# Patient Record
Sex: Female | Born: 1965 | Race: White | Hispanic: No | Marital: Married | Smoking: Former smoker
Health system: Southern US, Community
[De-identification: ages and names within clinical notes are randomized; demographics above are authoritative.]

## PROBLEM LIST (undated history)

## (undated) DIAGNOSIS — K760 Fatty (change of) liver, not elsewhere classified: Secondary | ICD-10-CM

## (undated) DIAGNOSIS — F419 Anxiety disorder, unspecified: Secondary | ICD-10-CM

## (undated) DIAGNOSIS — R569 Unspecified convulsions: Secondary | ICD-10-CM

## (undated) DIAGNOSIS — F32A Depression, unspecified: Secondary | ICD-10-CM

## (undated) DIAGNOSIS — F4310 Post-traumatic stress disorder, unspecified: Secondary | ICD-10-CM

## (undated) DIAGNOSIS — R079 Chest pain, unspecified: Secondary | ICD-10-CM

## (undated) DIAGNOSIS — R0602 Shortness of breath: Secondary | ICD-10-CM

## (undated) DIAGNOSIS — M069 Rheumatoid arthritis, unspecified: Secondary | ICD-10-CM

## (undated) DIAGNOSIS — F329 Major depressive disorder, single episode, unspecified: Secondary | ICD-10-CM

## (undated) DIAGNOSIS — M2550 Pain in unspecified joint: Secondary | ICD-10-CM

## (undated) DIAGNOSIS — R0600 Dyspnea, unspecified: Secondary | ICD-10-CM

## (undated) DIAGNOSIS — G43909 Migraine, unspecified, not intractable, without status migrainosus: Secondary | ICD-10-CM

## (undated) DIAGNOSIS — G4730 Sleep apnea, unspecified: Secondary | ICD-10-CM

## (undated) DIAGNOSIS — E7800 Pure hypercholesterolemia, unspecified: Secondary | ICD-10-CM

## (undated) DIAGNOSIS — K219 Gastro-esophageal reflux disease without esophagitis: Secondary | ICD-10-CM

## (undated) DIAGNOSIS — F319 Bipolar disorder, unspecified: Secondary | ICD-10-CM

## (undated) DIAGNOSIS — I10 Essential (primary) hypertension: Secondary | ICD-10-CM

## (undated) DIAGNOSIS — Z0189 Encounter for other specified special examinations: Secondary | ICD-10-CM

## (undated) DIAGNOSIS — R519 Headache, unspecified: Secondary | ICD-10-CM

## (undated) DIAGNOSIS — R51 Headache: Secondary | ICD-10-CM

## (undated) DIAGNOSIS — M549 Dorsalgia, unspecified: Secondary | ICD-10-CM

## (undated) DIAGNOSIS — G4733 Obstructive sleep apnea (adult) (pediatric): Secondary | ICD-10-CM

## (undated) DIAGNOSIS — M7989 Other specified soft tissue disorders: Secondary | ICD-10-CM

## (undated) DIAGNOSIS — E119 Type 2 diabetes mellitus without complications: Secondary | ICD-10-CM

## (undated) DIAGNOSIS — K5900 Constipation, unspecified: Secondary | ICD-10-CM

## (undated) DIAGNOSIS — F445 Conversion disorder with seizures or convulsions: Secondary | ICD-10-CM

## (undated) DIAGNOSIS — L719 Rosacea, unspecified: Secondary | ICD-10-CM

## (undated) HISTORY — PX: TUBAL LIGATION: SHX77

## (undated) HISTORY — DX: Encounter for other specified special examinations: Z01.89

## (undated) HISTORY — DX: Chest pain, unspecified: R07.9

## (undated) HISTORY — PX: APPENDECTOMY: SHX54

## (undated) HISTORY — PX: ABDOMINAL HYSTERECTOMY: SHX81

## (undated) HISTORY — DX: Fatty (change of) liver, not elsewhere classified: K76.0

## (undated) HISTORY — DX: Bipolar disorder, unspecified: F31.9

## (undated) HISTORY — DX: Essential (primary) hypertension: I10

## (undated) HISTORY — PX: TONSILLECTOMY: SUR1361

## (undated) HISTORY — PX: CYST EXCISION: SHX5701

## (undated) HISTORY — DX: Type 2 diabetes mellitus without complications: E11.9

## (undated) HISTORY — DX: Shortness of breath: R06.02

## (undated) HISTORY — PX: OTHER SURGICAL HISTORY: SHX169

## (undated) HISTORY — PX: ROTATOR CUFF REPAIR: SHX139

## (undated) HISTORY — DX: Pain in unspecified joint: M25.50

## (undated) HISTORY — DX: Migraine, unspecified, not intractable, without status migrainosus: G43.909

## (undated) HISTORY — DX: Rosacea, unspecified: L71.9

## (undated) HISTORY — DX: Anxiety disorder, unspecified: F41.9

## (undated) HISTORY — DX: Dorsalgia, unspecified: M54.9

## (undated) HISTORY — PX: BACK SURGERY: SHX140

## (undated) HISTORY — DX: Gastro-esophageal reflux disease without esophagitis: K21.9

## (undated) HISTORY — DX: Pure hypercholesterolemia, unspecified: E78.00

## (undated) HISTORY — DX: Rheumatoid arthritis, unspecified: M06.9

## (undated) HISTORY — DX: Constipation, unspecified: K59.00

## (undated) HISTORY — DX: Obstructive sleep apnea (adult) (pediatric): G47.33

## (undated) HISTORY — PX: CHOLECYSTECTOMY: SHX55

## (undated) HISTORY — DX: Other specified soft tissue disorders: M79.89

---

## 2002-01-03 ENCOUNTER — Encounter: Payer: Self-pay | Admitting: *Deleted

## 2002-01-03 ENCOUNTER — Emergency Department (HOSPITAL_COMMUNITY): Admission: EM | Admit: 2002-01-03 | Discharge: 2002-01-03 | Payer: Self-pay | Admitting: *Deleted

## 2002-03-28 ENCOUNTER — Ambulatory Visit (HOSPITAL_COMMUNITY): Admission: RE | Admit: 2002-03-28 | Discharge: 2002-03-28 | Payer: Self-pay | Admitting: Family Medicine

## 2002-03-28 ENCOUNTER — Encounter: Payer: Self-pay | Admitting: Family Medicine

## 2002-06-15 ENCOUNTER — Emergency Department (HOSPITAL_COMMUNITY): Admission: EM | Admit: 2002-06-15 | Discharge: 2002-06-15 | Payer: Self-pay | Admitting: Emergency Medicine

## 2002-10-18 ENCOUNTER — Encounter: Payer: Self-pay | Admitting: Emergency Medicine

## 2002-10-18 ENCOUNTER — Emergency Department (HOSPITAL_COMMUNITY): Admission: EM | Admit: 2002-10-18 | Discharge: 2002-10-19 | Payer: Self-pay | Admitting: Emergency Medicine

## 2002-12-12 ENCOUNTER — Inpatient Hospital Stay (HOSPITAL_COMMUNITY): Admission: EM | Admit: 2002-12-12 | Discharge: 2002-12-17 | Payer: Self-pay | Admitting: Internal Medicine

## 2002-12-12 ENCOUNTER — Encounter: Payer: Self-pay | Admitting: Family Medicine

## 2002-12-12 ENCOUNTER — Encounter: Payer: Self-pay | Admitting: *Deleted

## 2002-12-12 ENCOUNTER — Encounter: Payer: Self-pay | Admitting: Internal Medicine

## 2002-12-15 ENCOUNTER — Encounter: Payer: Self-pay | Admitting: Family Medicine

## 2002-12-16 ENCOUNTER — Encounter: Payer: Self-pay | Admitting: Internal Medicine

## 2003-01-27 ENCOUNTER — Emergency Department (HOSPITAL_COMMUNITY): Admission: EM | Admit: 2003-01-27 | Discharge: 2003-01-27 | Payer: Self-pay | Admitting: Emergency Medicine

## 2003-04-28 ENCOUNTER — Ambulatory Visit (HOSPITAL_COMMUNITY): Admission: RE | Admit: 2003-04-28 | Discharge: 2003-04-28 | Payer: Self-pay | Admitting: Family Medicine

## 2003-05-02 ENCOUNTER — Inpatient Hospital Stay (HOSPITAL_COMMUNITY): Admission: AD | Admit: 2003-05-02 | Discharge: 2003-05-05 | Payer: Self-pay | Admitting: Neurological Surgery

## 2003-12-07 ENCOUNTER — Emergency Department (HOSPITAL_COMMUNITY): Admission: EM | Admit: 2003-12-07 | Discharge: 2003-12-07 | Payer: Self-pay | Admitting: Emergency Medicine

## 2004-01-07 ENCOUNTER — Ambulatory Visit (HOSPITAL_COMMUNITY): Admission: RE | Admit: 2004-01-07 | Discharge: 2004-01-07 | Payer: Self-pay | Admitting: Neurological Surgery

## 2004-02-20 ENCOUNTER — Emergency Department (HOSPITAL_COMMUNITY): Admission: EM | Admit: 2004-02-20 | Discharge: 2004-02-20 | Payer: Self-pay | Admitting: *Deleted

## 2004-02-24 ENCOUNTER — Ambulatory Visit (HOSPITAL_COMMUNITY): Admission: RE | Admit: 2004-02-24 | Discharge: 2004-02-24 | Payer: Self-pay | Admitting: Neurological Surgery

## 2004-03-01 ENCOUNTER — Emergency Department (HOSPITAL_COMMUNITY): Admission: EM | Admit: 2004-03-01 | Discharge: 2004-03-01 | Payer: Self-pay | Admitting: Emergency Medicine

## 2004-03-10 ENCOUNTER — Emergency Department (HOSPITAL_COMMUNITY): Admission: EM | Admit: 2004-03-10 | Discharge: 2004-03-10 | Payer: Self-pay | Admitting: Emergency Medicine

## 2004-03-17 ENCOUNTER — Encounter
Admission: RE | Admit: 2004-03-17 | Discharge: 2004-06-15 | Payer: Self-pay | Admitting: Physical Medicine & Rehabilitation

## 2004-03-21 ENCOUNTER — Ambulatory Visit: Payer: Self-pay | Admitting: Physical Medicine & Rehabilitation

## 2004-04-01 ENCOUNTER — Ambulatory Visit: Payer: Self-pay | Admitting: Physical Medicine & Rehabilitation

## 2004-05-10 ENCOUNTER — Emergency Department (HOSPITAL_COMMUNITY): Admission: EM | Admit: 2004-05-10 | Discharge: 2004-05-11 | Payer: Self-pay | Admitting: Emergency Medicine

## 2004-05-19 ENCOUNTER — Encounter: Admission: RE | Admit: 2004-05-19 | Discharge: 2004-05-19 | Payer: Self-pay | Admitting: Neurological Surgery

## 2004-06-28 ENCOUNTER — Encounter (HOSPITAL_COMMUNITY): Admission: RE | Admit: 2004-06-28 | Discharge: 2004-07-28 | Payer: Self-pay | Admitting: Neurological Surgery

## 2004-07-19 ENCOUNTER — Emergency Department (HOSPITAL_COMMUNITY): Admission: EM | Admit: 2004-07-19 | Discharge: 2004-07-19 | Payer: Self-pay | Admitting: Emergency Medicine

## 2004-07-22 ENCOUNTER — Ambulatory Visit (HOSPITAL_COMMUNITY): Admission: RE | Admit: 2004-07-22 | Discharge: 2004-07-22 | Payer: Self-pay | Admitting: General Surgery

## 2004-07-22 ENCOUNTER — Ambulatory Visit (HOSPITAL_COMMUNITY): Admission: RE | Admit: 2004-07-22 | Discharge: 2004-07-22 | Payer: Self-pay | Admitting: Family Medicine

## 2004-07-26 ENCOUNTER — Inpatient Hospital Stay (HOSPITAL_COMMUNITY): Admission: EM | Admit: 2004-07-26 | Discharge: 2004-07-28 | Payer: Self-pay | Admitting: Emergency Medicine

## 2004-11-19 ENCOUNTER — Encounter: Admission: RE | Admit: 2004-11-19 | Discharge: 2004-11-19 | Payer: Self-pay | Admitting: Neurological Surgery

## 2004-11-30 ENCOUNTER — Encounter: Admission: RE | Admit: 2004-11-30 | Discharge: 2004-11-30 | Payer: Self-pay | Admitting: Neurological Surgery

## 2004-12-29 ENCOUNTER — Encounter (INDEPENDENT_AMBULATORY_CARE_PROVIDER_SITE_OTHER): Payer: Self-pay | Admitting: *Deleted

## 2004-12-29 ENCOUNTER — Inpatient Hospital Stay (HOSPITAL_COMMUNITY): Admission: RE | Admit: 2004-12-29 | Discharge: 2005-01-02 | Payer: Self-pay | Admitting: Neurological Surgery

## 2005-03-20 ENCOUNTER — Encounter: Admission: RE | Admit: 2005-03-20 | Discharge: 2005-06-18 | Payer: Self-pay | Admitting: Neurological Surgery

## 2005-06-10 ENCOUNTER — Emergency Department (HOSPITAL_COMMUNITY): Admission: EM | Admit: 2005-06-10 | Discharge: 2005-06-10 | Payer: Self-pay | Admitting: Emergency Medicine

## 2005-07-21 ENCOUNTER — Ambulatory Visit (HOSPITAL_COMMUNITY): Admission: RE | Admit: 2005-07-21 | Discharge: 2005-07-21 | Payer: Self-pay | Admitting: Internal Medicine

## 2005-10-23 ENCOUNTER — Encounter: Admission: RE | Admit: 2005-10-23 | Discharge: 2005-10-23 | Payer: Self-pay | Admitting: Internal Medicine

## 2007-01-16 ENCOUNTER — Ambulatory Visit (HOSPITAL_COMMUNITY): Admission: RE | Admit: 2007-01-16 | Discharge: 2007-01-16 | Payer: Self-pay | Admitting: Family Medicine

## 2007-11-03 ENCOUNTER — Emergency Department (HOSPITAL_COMMUNITY): Admission: EM | Admit: 2007-11-03 | Discharge: 2007-11-03 | Payer: Self-pay | Admitting: Emergency Medicine

## 2008-03-20 ENCOUNTER — Encounter: Admission: RE | Admit: 2008-03-20 | Discharge: 2008-03-20 | Payer: Self-pay | Admitting: Family Medicine

## 2008-03-27 ENCOUNTER — Encounter: Admission: RE | Admit: 2008-03-27 | Discharge: 2008-03-27 | Payer: Self-pay | Admitting: Family Medicine

## 2009-09-15 ENCOUNTER — Emergency Department (HOSPITAL_COMMUNITY): Admission: EM | Admit: 2009-09-15 | Discharge: 2009-09-15 | Payer: Self-pay | Admitting: Emergency Medicine

## 2009-09-24 ENCOUNTER — Emergency Department (HOSPITAL_COMMUNITY): Admission: EM | Admit: 2009-09-24 | Discharge: 2009-09-25 | Payer: Self-pay | Admitting: Emergency Medicine

## 2010-03-27 ENCOUNTER — Encounter: Payer: Self-pay | Admitting: Family Medicine

## 2010-03-27 ENCOUNTER — Encounter: Payer: Self-pay | Admitting: Internal Medicine

## 2010-05-21 LAB — DIFFERENTIAL
Eosinophils Absolute: 0.1 10*3/uL (ref 0.0–0.7)
Eosinophils Relative: 1 % (ref 0–5)
Lymphocytes Relative: 32 % (ref 12–46)
Lymphs Abs: 3.5 10*3/uL (ref 0.7–4.0)
Monocytes Relative: 6 % (ref 3–12)
Neutro Abs: 6.8 10*3/uL (ref 1.7–7.7)

## 2010-05-21 LAB — POCT CARDIAC MARKERS: Troponin i, poc: <0.05 ng/mL (ref 0.00–0.09)

## 2010-05-21 LAB — CBC
Hemoglobin: 12.4 g/dL (ref 12.0–15.0)
MCH: 30.4 pg (ref 26.0–34.0)
MCHC: 34.2 g/dL (ref 30.0–36.0)
RBC: 4.1 MIL/uL (ref 3.87–5.11)
WBC: 11.1 10*3/uL — ABNORMAL HIGH (ref 4.0–10.5)

## 2010-05-21 LAB — BASIC METABOLIC PANEL
Calcium: 8.6 mg/dL (ref 8.4–10.5)
GFR calc Af Amer: >60 mL/min (ref >60)
GFR calc non Af Amer: >60 mL/min (ref >60)

## 2010-05-21 LAB — D-DIMER, QUANTITATIVE: D-Dimer, Quant: 0.22 ug/mL-FEU (ref 0.00–0.48)

## 2010-05-22 LAB — URINALYSIS, ROUTINE W REFLEX MICROSCOPIC
Hgb urine dipstick: NEGATIVE
Protein, ur: NEGATIVE mg/dL
pH: 5.5 (ref 5.0–8.0)

## 2010-07-22 NOTE — Op Note (Signed)
NAME:  [name], [name]                             ACCOUNT NO.:  [id number]   MEDICAL RECORD NO.:  [id number]                   PATIENT TYPE:  OIB   LOCATION:  5010                                 FACILITY:  [facility]   PHYSICIAN:  Stefani Dama, M.D.               DATE OF BIRTH:  04-26-1965   DATE OF PROCEDURE:  05/01/2003  DATE OF DISCHARGE:                                 OPERATIVE REPORT   PREOPERATIVE DIAGNOSIS:  Herniated nucleus pulposus, L5-S1, central, with  left lumbar radiculopathy.   POSTOPERATIVE DIAGNOSIS:  Herniated nucleus pulposus, L5-S1, central, with  left lumbar radiculopathy.   PROCEDURE:  Left Med-Rx diskectomy, L5-S1, with operating microscope and  microdissection technique.   SURGEON:  Stefani Dama, M.D.   FIRST ASSISTANT:  Cristi Loron, M.D.   ANESTHESIA:  General endotracheal.   INDICATIONS:  The patient is a 45 year old individual who has had  significant back and left lower extremity pain.  She has a large, extruded  fragment of disk at L5-S1 centrally.  This is somewhat eccentric to the left  side.  She has had a severe S1 radiculopathy with some weakness in the  gastroc. Having failed efforts at conservative management and having  worsening pain for some time, she has been advised regarding surgical  decompression.   PROCEDURE:  The patient was brought to the operating room, supine on the  stretcher.  After smooth induction of general anesthesia, she was turned  prone.  The back was shaved, prepped with Duraprep, and draped in a sterile  fashion.  Using fluoroscopic guidance, then the L5-S1 interspace and the  laminar arch of L5 was localized, and a K-wire was passed to the area of the  lamina of L5, and then a small 15 mm incision was created over this area.  The dissection was then performed using a winding technique and a series of  dilators, passing an 18 mm x 7 cm deep endoscopic cannula to the area of the  interlaminar space at L5-S1.   A laminotomy was then created by removing the  inferior margin of the lamina of L5 after the mesial wall of the facet.  _________ Ligament in this region was taken off. The common dural tube was  identified, and it was noted to be bowed dorsally.  The S1 nerve root was  also tented up cephalad in this region. Microdissection technique was then  used to divide epidural veins and mobilize the nerve root medially.  Underneath this, then a large sac was found in the subligamentous space.  The ligament itself was incised, and through this aperture, then several  fragments of disk were removed.  Dissection was then carried out both  medially and laterally to expose and remove a significant quantity of  sequestered, degenerated disk material.  This was allowed for decompression  of the common dural tube and the S1 nerve  root. A portion of the disk  inferiorly was rather attached to the ligament and common dural tube. While  this was dissected, a small rent occurred in the nerve root sleeve of the S1  nerve root on that left side.  No spinal fluid was noted to egress; however,  the nerve root was right at the border of the small tear.  This was  protected for later care, and a diskectomy was ultimately completed,  removing a significant quantity of disk material from within the disk space  itself.  Once the dissection was completed and the area was well-  decompressed, attention was then turned to a small dural tear.  There was no  spinal fluid that egressed from this area.  It was felt that it should be  protected with some biologic fibrin glue adhesive. This was prepared, and  then the nerve root was coated before removing the microscope.  It should be  noted that Dr. Lovell Sheehan helped with the entirety of this procedure with all  of the dissection and mobilization of the common dural tube and nerve root  and the diskectomy.  In the end, the microscope was withdrawn after  placement of the biologic  adhesive.  The endoscopic cannula was removed, and  then the fascia was closed with a singular stitch of 3-0 Vicryl, and the  subcuticular skin was closed with 3-0 Vicryl in interrupted fashion.  The  patient tolerated the procedure well and was returned to the recovery room  in stable condition.                                               Stefani Dama, M.D.    Merla Riches  D:  05/01/2003  T:  05/02/2003  Job:  [id number]

## 2010-07-22 NOTE — H&P (Signed)
NAME:  [name] [name]                   ACCOUNT NO.:  [id number]   MEDICAL RECORD NO.:  [id number]          PATIENT TYPE:  AMB   LOCATION:  DAY                           FACILITY:  [facility]   PHYSICIAN:  Dalia Heading, M.D.  DATE OF BIRTH:  16-Sep-1965   DATE OF ADMISSION:  DATE OF DISCHARGE:  LH                                HISTORY & PHYSICAL   CHIEF COMPLAINT:  Hematochezia.   HISTORY OF PRESENT ILLNESS:  The patient is a 45 year old white female who  presents with a two-week history of intermittent hematochezia.  No abdominal  pain, weight loss, nausea, vomiting, diarrhea, constipation, melena had been  noted.  She has never had a colonoscopy.  There is no immediate family  history of colon carcinoma.   PAST MEDICAL HISTORY:  Significant for:  1.  Depression.  2.  Hypertension.   PAST SURGICAL HISTORY:  1.  Cholecystectomy.  2.  Appendectomy.  3.  Partial hysterectomy.  4.  Tonsillectomy.   MEDICATIONS:  1.  Wellbutrin 300 mg p.o. daily.  2.  Hydrochlorothiazide 12.5 mg p.o. daily.   ALLERGIES:  PENICILLIN.   REVIEW OF SYSTEMS:  Noncontributory.   PHYSICAL EXAMINATION:  GENERAL:  The patient is a well-developed, well-  nourished white female in no acute distress.  VITAL SIGNS:  She is afebrile and vital signs are stable.  LUNGS:  Clear to auscultation with equal breath sounds bilaterally.  HEART:  Regular rate and rhythm without S3, S4, or murmurs.  ABDOMEN:  Soft, nontender, nondistended.  No hepatosplenomegaly or masses  are noted.  RECTAL:  Deferred to the procedure.   IMPRESSION:  Hematochezia.   PLAN:  The patient is scheduled for a colonoscopy on Jul 22, 2004.  The  risks and benefits of the procedure including bleeding and perforation were  fully explained to the patient who gave informed consent.      MAJ/MEDQ  D:  07/14/2004  T:  07/15/2004  Job:  [id number]

## 2010-07-22 NOTE — Procedures (Signed)
NAME:  [name], [name]                             ACCOUNT NO.:  [id number]   MEDICAL RECORD NO.:  [id number]                   PATIENT TYPE:  INP   LOCATION:  A220                                 FACILITY:  [facility]   PHYSICIAN:  Dani Gobble, MD                    DATE OF BIRTH:  [birth year]   DATE OF PROCEDURE:  12/12/2002  DATE OF DISCHARGE:                                  ECHOCARDIOGRAM   INDICATIONS FOR PROCEDURE:  [name] is a 45 year old female admitted with  chest discomfort without prior cardiac history.   TECHNICAL QUALITY:  The technical quality of the study is adequate.   FINDINGS:  The aorta is within normal limits at 2.9 cm.   The left atrium is also within normal limits at 3.9 cm.  No obvious clots or  masses were appreciated and the patient appeared to be in sinus rhythm  during this procedure.   The interventricular septum and posterior wall were within normal limits in  thickness at 1.1 cm and 1.0 cm, respectively.   The aortic valve appeared thin, trileaflet, and pliable with normal leaflet  excursion. No significant aortic insufficiency is noted. Doppler  interrogation of the aortic valve is within normal limits.   The mitral valve also appeared structurally normal with normal leaflet  excursion. No mitral valve prolapse is noted. No mitral annular  calcification is noted. Doppler interrogation of the mitral valve is within  normal limits. No significant mitral regurgitation is noted.   The pulmonic valve was not well visualized.   The tricuspid valve appeared grossly structurally normal with mild tricuspid  regurgitation noted. Pulmonary pressures were estimated to be normal.   The left ventricle is normal in size with the LVIDD measured at 4.1 cm and  the LVISD measured at 3.6 cm. Left ventricular systolic function is normal  and no regional wall motion abnormalities are appreciated. There is no  evidence for diastolic dysfunction. The right atrium is  normal in size while  the right ventricle appears mildly dilated but with normal right ventricular  systolic function. The inferior vena cava is normal in size with good  collapse. No pericardial effusion is appreciated on this study.   IMPRESSION:  1. No significant valvular abnormality noted.  2. Normal left ventricular size and systolic function.  3. No regional wall motion abnormalities noted.  4. Mild tricuspid regurgitation.  5.     Pulmonary pressures estimated to be normal.  6. Mild right ventricular enlargement with normal right ventricular systolic     function.  7. No pericardial effusion noted.      ___________________________________________  Dani Gobble, MD   AB/MEDQ  D:  12/12/2002  T:  12/12/2002  Job:  [id number]

## 2010-07-22 NOTE — Consult Note (Signed)
NAME:  [name], [name] Shela Commons                             ACCOUNT NO.:  [id number]   MEDICAL RECORD NO.:  [id number]                   PATIENT TYPE:  INP   LOCATION:  A220                                 FACILITY:  [facility]   PHYSICIAN:  R. Roetta Sessions, M.D.              DATE OF BIRTH:  1965/06/24   DATE OF CONSULTATION:  12/15/2002  DATE OF DISCHARGE:                                   CONSULTATION   REASON FOR CONSULTATION:  Probable common duct stone for ERCP.   HISTORY OF PRESENT ILLNESS:  [name] is a pleasant, 45 year old  lady admitted to Dr. Lamar Blinks service on December 12, 2002 with chest pain.  Workup ensued including a spiral CT of the chest and doppler studies of the  lower extremity both negative for PE/DVT respectively.  She was seen by Dr.  Domingo Sep in cardiology consultation who, among other things, recommended  abdominal ultrasound.  She was found to have cholelithiasis.  Initial  laboratory evaluation demonstrated SGOT and SGPT of 89 and 77 respectively.  Total bilirubin was 0.9.  On October 10 they were repeated and bilirubin was  1.4, alkaline phosphatase 227, SGOT 301, SGPT 376, amylase and lipase were  normal at 46 and 27.  Dr. Lovell Sheehan was consulted and it was felt that her  chest pain was related to her gallbladder  She underwent a laparoscopic  cholecystectomy today and an intraoperative cholangiogram.  Cholangiogram  revealed a nondilated biliary tree with failure of contrast to spill into  the duodenum after multiple attempts.  I have reviewed these films in some  detail with Dr._________.  She is doing well postoperatively and is back in  her room.  She is now on IV Rocephin.  [name] does not have any history of  other gastrointestinal illness.  She specifically denies reflux symptoms,  odynophagia, dysphagia, or early satiety.  She has not had any melena or  rectal bleeding or change in weight.   PAST MEDICAL HISTORY:  Significant for depression.   PAST  SURGICAL HISTORY:  Appendectomy, partial hysterectomy, laparoscopic  cholecystectomy today, and tonsillectomy.   MEDICATIONS ON ADMISSION:  Lexapro 10 mg daily.   ALLERGIES:  PENICILLIN.   FAMILY HISTORY:  Negative for chronic GI or liver disease.  No history of GI  neoplasia.  There is a positive family history of midlife coronary artery  disease.   SOCIAL HISTORY:  The patient is a former smoker.  She does not use alcohol.  She has 2 children.   REVIEW OF SYSTEMS:  As in history of present illness.   PHYSICAL EXAMINATION:  GENERAL:  The patient is alert, accompanied by her  mother.  VITAL SIGNS:  BP 120/91, pulse 75.  O2 saturation 97%.  SKIN:  Warm and dry.  No cutaneous stigmata of chronic liver disease.  No  jaundice.  HEENT:  No scleral icterus.  Conjunctivae are pink.  Oral cavity no lesions.  NECK:  JVD is not prominent.  CHEST:  Lungs are clear to auscultation.  BREASTS:  Exam is deferred.  ABDOMEN:  Laparoscopic sites: The dressing is in place.  Bowel sounds  present but diminished.  She has some tenderness around the laparoscopic  sites.  EXTREMITIES:  No edema.   ADDITIONAL LABORATORY DATA:  CBC on admission: White count 9.4, H&H 13.1 and  38.2, MCV 86, platelet count 303,000.  Pro time 12.5. D-dimer 0.22.  Sodium  136, potassium 3.8, chloride 104, CO2 26, BUN 7, creatinine 0.5, TSH 4.3,  cholesterol 178, triglycerides 86.   IMPRESSION:  This patient is a 45 year old lady admitted to the hospital  with chest and abdominal pain. She was ultimately found to have  cholelithiasis as the likely culprit. She also has had a stuttering bump  in her liver function tests highly suggestive of a partial extrahepatic  biliary obstruction.  Intraoperative cholangiogram today revealed a cut off  with no flow of contrast from the distal common bile duct into the duodenum.  Dr. Lovell Sheehan tells me that she had multiple small stones in the gallbladder.  She likely does have  choledocholithiasis.   RECOMMENDATIONS:  I agree with Dr. Lovell Sheehan, an ERCP is warranted to  decompress her biliary tree.  I have discussed the technical aspects of the  procedure with [name] and her mother at some length.  I have discussed, in  detail the risks of the procedure, including, but not limited to: A 1:10  change of pancreatitis, risk of perforation and bleeding. Their questions  were answered.  They are agreeable.  We will plan to perform an ERCP early  tomorrow afternoon.  Will allow a clear liquid breakfast. LFTs, amylase and  lipase are pending for tomorrow morning.  The patient is on IV Rocephin.  I  would like to thank Dr. Lovell Sheehan for allowing me to see this nice lady today.      ___________________________________________                                            Jonathon Bellows, M.D.   RMR/MEDQ  D:  12/15/2002  T:  12/15/2002  Job:  [id number]   cc:   Dalia Heading, M.D.  [address], [place]  [place] [address]  Fax: [phone]   Corrie Mckusick, M.D.  [phone] [place]. [place]  [place]  [place] [address]  Fax: [phone]

## 2010-07-22 NOTE — Discharge Summary (Signed)
NAMEDaleen [name], Olanda                   ACCOUNT NO.:  [id number]   MEDICAL RECORD NO.:  [id number]          PATIENT TYPE:  INP   LOCATION:  A417                          FACILITY:  [facility]   PHYSICIAN:  Dalia Heading, M.D.  DATE OF BIRTH:  [birth year]   DATE OF ADMISSION:  07/25/2004  DATE OF DISCHARGE:  05/25/2006LH                                 DISCHARGE SUMMARY   HOSPITAL COURSE SUMMARY:  The patient is a 45 year old white female, status  post colonoscopy with polypectomy on Jul 22, 2004. On Jul 25, 2004, she  started having hematochezia. She presented to the emergency room. Her  hematocrit was stable. It was elected to proceed with a repeat colonoscopy  for control of bleeding from the polypectomy site. The polypectomy site was  injected with epinephrine. She tolerated the procedure well. Her post  procedure course was remarkable for nausea which she has had in the past.  Her hematocrit stabilized at 26. She did not require any blood transfusions.  Her diet was advanced without difficulty.   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   The patient is being discharged home on hospital day three in fair and  stable condition.   DISCHARGE INSTRUCTIONS:  The patient is to follow up with Dr. Franky Macho  on Aug 02, 2004. Discharge medications include Prevacid 30 mg p.o. daily,  Phenergan 25 mg p.o. q.6h. p.r.n. nausea, milk of magnesia as needed for  constipation, Vicodin one to two tablets p.o. q.4h. p.r.n. pain.   PRINCIPAL DIAGNOSES:  Post polypectomy bleeding, resolved.   PRINCIPAL PROCEDURE:  Colonoscopy with controlled bleeding on Jul 25, 2004.      MAJ/MEDQ  D:  07/28/2004  T:  07/28/2004  Job:  [id number]

## 2010-07-22 NOTE — Procedures (Signed)
NAMEDaleen [name], [name]                   ACCOUNT NO.:  [id number]   MEDICAL RECORD NO.:  [id number]          PATIENT TYPE:  REC   LOCATION:  TPC                          FACILITY:  [facility]   PHYSICIAN:  Erick Colace, M.D.DATE OF BIRTH:  1965/11/30   DATE OF PROCEDURE:  04/02/2004  DATE OF DISCHARGE:                                 OPERATIVE REPORT   DATE OF SERVICE:  March 31, 2004.   MEDICAL RECORD NUMBER:  [id number].   DATE OF BIRTH:  [birth year].   Right sacroiliac joint injection under fluoroscopic guidance.   INDICATIONS:  Right low back pain and buttock pain.  Partially responsive to  conservative measures.   Preinjection pain level 7/10.   Informed consent was obtained after describing the risks and benefits of the  procedure to the patient.  These included bleeding, bruising, infection,  loss of bowel and bladder function and temporary or partial lower extremity  weakness.  She elects to proceed.  The patient was placed prone on the  fluoroscopy table.  Betadine prep and sterile drape.  A 35 gauge 1-1/2 inch  needle was used to anesthetize the skin and subcutaneous tissues with 1%  lidocaine x 2 mL.  Then a 22 gauge 3-1/2 spinal needle was inserted under  fluoroscopic guidance to the right SI joint, confirmed at both AP and  lateral imaging.  Then Omnipaque 180 x 0.3 mL was injected showing good  joint outline.  No intravascular uptake under live fluoroscopy.  Then a  solution containing 0.5 mL of 40 mg/mL of Kenalog and 0.5 mL of 2%  methylparaben-free lidocaine was injected.  The patient tolerated the  procedure well.  Post injection instructions given.  She will follow up with  me in two to three weeks.  If she still has good pain relief, will send her  to physical therapy as per Dr. Riley Kill' recommend.  If she does not have good  pain relief, will either repeat the injection or consider facet blocks.      AEK/MEDQ  D:  04/01/2004 11:51:47  T:  04/02/2004  10:27:59  Job:  [id number]

## 2010-07-22 NOTE — Group Therapy Note (Signed)
DATE OF VISIT:  March 21, 2004.   MEDICAL RECORD NUMBER:  [id number].   DATE OF BIRTH:  Nov 02, 1965.   CHIEF COMPLAINT:  Low back and right hip pain.   HISTORY OF PRESENT ILLNESS:  This is a 45 year old white female referred to  our office by Stefani Dama, M.D., for chronic low back pain.  She states  that the back pain began in December of 2004.  Dr. Danielle Dess diagnosed a  herniated disk at L5-S1.  The patient eventually had left L5-S1  microdiskectomy performed on May 01, 2003.  The patient had significant  improvement of her right leg pain, but the back pain has slowly recurred and  really began peaking in October.  She had another workup in November, which  included an MRI on January 07, 2004.  This MRI revealed a broad based disk  protrusion at L3-4 central and right associated with moderate facet  arthropathy and bilateral L4 nerve root encroachment was seen with central  canal stenosis, right worse than left.  The findings appeared worse than a  prior study done in February.  The patient also had a CT myelogram, the  results of which I am not privy to.  It was felt that no obvious compression  was taking place at the spinal cord or roots.  This is per patient report.  The patient had a lumbar epidural performed in November as well.  I do not  see evidence of this being done.   The patient has been on multiple medications, including anti-inflammatories,  including Relafen.  She has also been on oxycodone and hydrocodone.  She has  received Robaxin as a muscle relaxant.  She received some therapy initially  with the surgery, but really has not had any since then.   The patient reports that her pain is at an 8/10 on average.  Sometimes it  will increase to 10/10.  She states that the pain is not quite as bad in her  leg this time, but has returned to the same level in her back that she had  been at prior to surgery.  The pain improves with pain medications and  somewhat at rest.  She has to lie on her side for comfort.  The pain worsens  with bending, sitting and driving to a certain extent.  She is unable to  sleep on her stomach.  The patient denies any numbness in the leg or frank  weakness.   PAST MEDICAL HISTORY:  Significant for depression.  She has had gallbladder  resection in October of 2004.  She has also had an appendectomy,  tonsillectomy, tubal ligation, partial hysterectomy in 2002 and ovarian cyst  removal.   FAMILY HISTORY:  Unremarkable and noncontributory.   SOCIAL HISTORY:  The patient occasionally drinks alcohol.  She does not  smoke.  She has two children.   VOCATIONAL HISTORY:  The patient last worked in February of 2005.  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  Common weights were 10-20 pounds routinely.  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  She is currently taking classes at [employer].  She  has been doing this since May.   REVIEW OF SYSTEMS:  The patient denies any chest pain, shortness of breath,  cold, flu, wheezing or coughing symptoms.  Denies any weakness, numbness,  dizziness, spasms, problems with confusion, new depression, headaches or  hearing loss.  Denies nausea, vomiting, diarrhea, constipation, bowel or  bladder dysfunction, fever, chills or weight changes.  Other pertinent  positives listed above.   PHYSICAL EXAMINATION:  The blood pressure is 104/71, the pulse is 80, the  respiratory rate is 20 and she is saturating 98% on room air.  The patient  has fairly normal gait.  Affect is bright and appropriate.  Appearance is  well kept.  She had intact upper extremity strength and range of motion.  The head was normocephalic and atraumatic.  Pupils equal, round and reactive  to light.  The heart was regular rate and rhythm.  The lungs were clear.  Abdomen soft and nontender.  She  is slightly overweight.  The lower  extremity strength is near 5/5 throughout.  There was some pain inhibition  on the right side with hip flexion and knee extension.  No focal sensory  loss is seen.  Reflexes were all 1-2+/4.  She had equivocal straight leg  testing bilaterally.  Patrick's test was positive on the right and negative  on the left today.  Compression test was positive on the right and negative  on the left.  Piriformis testing was equivocal.  She had some minor pain  with palpation of the greater trochanters.  Piriformis was slightly tender  today.  She had significant tenderness from palpating around the PSIS area,  as well as the lumbar facets at L4-5 and L5-S1.  It was hard to discern any  focal muscle spasm there today.  Posture was fair with no obvious pelvic  tilt or rotation.  She was able to forward flex to about 65 degrees with  discomfort.  She also had slight extension of the back to 25-30 degrees with  mild discomfort.  Rotation to the left caused some mild pain today in the  right lumbar region.  Side bending and rotation to the other side also  caused discomfort, but with a bit less pain.   ASSESSMENT:  1.  Status post L5-S1 diskectomy for herniated nucleus pulposus.  2.  Chronic low back pain which is likely multifactorial, consisting of:      1.  Sacroiliac joint dysfunction.      2.  facet arthropathy.      3.  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  3.  Depression.   PLAN:  1.  As it appears the SI joint may be the most active component to her pain      syndrome, I would like to set her up for steroid injection of the right      SI joint under fluoroscopy.  Will send her to Dr. Wynn Banker for this      injection.  2.  In the meantime, will begin Lidoderm patches for local relief two      patches daily p.r.n.  3.  Will place her Norco 10/325 mg for breakthrough pain.  She may take one      to two q.6h. p.r.n. 4.  Will place her back  on an anti-inflammatory starting with Celebrex 200      mg daily.  5.  Will follow up with the patient pending results of her injection.  I      would like her to go to outpatient physical therapy to work on the      pelvic stabilization, as well as core muscle strengthening and back  range of motion.     Zach   ZTS/MedQ  D:  03/21/2004 15:41:18  T:  03/21/2004 16:11:10  Job #:  [id number]   cc:   Stefani Dama, M.D.  [address]  [place]  [place] [address]  Fax: [phone]   Corrie Mckusick, M.D.  Fax: [phone]

## 2010-07-22 NOTE — Discharge Summary (Signed)
NAMEEVELINA, [name]                   ACCOUNT NO.:  [id number]   MEDICAL RECORD NO.:  [id number]          PATIENT TYPE:  INP   LOCATION:  3008                         FACILITY:  [facility]   PHYSICIAN:  Stefani Dama, M.D.  DATE OF BIRTH:  [birth year]   DATE OF ADMISSION:  12/29/2004  DATE OF DISCHARGE:                                 DISCHARGE SUMMARY   ADMITTING DIAGNOSES:  1.  Lumbar herniated nucleus pulposus L3-L4.  2.  Recurrent herniated nucleus pulposus L5-S1.  3.  Spondylosis with right lumbar radiculopathy.   POSTOPERATIVE DIAGNOSES:  1.  Lumbar herniated nucleus pulposus L3-L4.  2.  Recurrent herniated nucleus pulposus L5-S1.  3.  Spondylosis with right lumbar radiculopathy.   PROCEDURE:  1.  Laminotomy, foraminotomy, and decompression of L5-S1 on the left for      recurrent disk herniation.  2.  Posterior interbody arthrodesis with deep bone spacer and OP-1 bone      morphogenic protein.  3.  Diskectomy L3-4 on the right with decompression L3-L4 nerve roots.  4.  Posterior lumbar interbody arthrodesis with peak bone spacer OP-1 bone      morphogenic plate protein.  5.  SPIRE plate fixation A2-1, L5-S1.  6.  Posterolateral arthrodesis L3-4 and L5-S1 with autograft and allograft.   CONDITION ON DISCHARGE:  Stable.   HOSPITAL COURSE:  [name] is a 45 year old individual who has had  significant back and lower extremity pain with a herniated nucleus pulposus  at L5-S1 which is recurrent, and a right-sided disk protrusion at L3-4. She  has had significant problems with back pain and leg pain that has been  intractable. The patient was advised regarding surgical decompression  arthrodesis at 3-4 and 5-1. She was taken to the operating room on December 29, 2004, where she underwent this procedure. Postoperatively, she has had  considerable problems with back pain and spasm. Her incision has been clean  and dry and she has been gradually ambulated. Her medications were  difficult  to control her pain and ultimately she required Dilaudid and is using  Dilaudid 2 mg every 3 hours as needed for pain, in addition to requiring  some Flexeril as a muscle relaxer; Valium was ineffective in that regard. At  this time she is discharged home. She will have a home health safety  evaluation. Physical therapy and occupational therapy feels that she is  independent in functioning here at this point. We will see her in the office  in three weeks' time for further follow-up.   CONDITION ON DISCHARGE:  Stable.   DISCHARGE DIAGNOSES:  1.  Lumbar herniated nucleus pulposus L3-L4.  2.  Recurrent herniated nucleus pulposus L5-S1.  3.  Spondylosis with right lumbar radiculopathy.      Stefani Dama, M.D.  Electronically Signed     HJE/MEDQ  D:  01/02/2005  T:  01/02/2005  Job:  [id number]

## 2010-07-22 NOTE — Op Note (Signed)
NAME:  [name], [name]                   ACCOUNT NO.:  [id number]   MEDICAL RECORD NO.:  [id number]          PATIENT TYPE:  INP   LOCATION:  3008                         FACILITY:  [facility]   PHYSICIAN:  Stefani Dama, M.D.  DATE OF BIRTH:  [birth year]   DATE OF PROCEDURE:  12/29/2004  DATE OF DISCHARGE:                                 OPERATIVE REPORT   PREOPERATIVE DIAGNOSIS:  Herniated nucleus pulposus L3-L4, recurrent  herniated nucleus pulposus L5-S1, spondylosis with right lumbar  radiculopathy.   POSTOPERATIVE DIAGNOSIS:  Herniated nucleus pulposus L3-L4, recurrent  herniated nucleus pulposus L5-S1, spondylosis with right lumbar  radiculopathy.   OPERATION:  Laminotomy, foraminotomy, decompression L5-S1 on the left, for  recurrent disc herniation, posterior lumbar interbody arthrodesis with PEEK  spacer and OP1 bone morphogenic protein, discectomy L3-L4 on the right with  decompression of L3 and L4 nerve root, posterior lumbar interbody  arthrodesis with PEEK spacer and OP1 bone morphogenic protein, Spire plate  fixation L3-L4 and L5-S1, posterolateral arthrodesis L3-L4 and L5-S1 with  autograft and allograft mixed with OP1.   SURGEON:  Stefani Dama, M.D.   FIRST ASSISTANT:  Clydene Fake, M.D.   ANESTHESIA:  General endotracheal anesthesia.   INDICATIONS FOR PROCEDURE:  [name] is a 45 year old individual who has  had significant disc herniation in the past at L5-S1 on the left side.  She  had continuous chronic back and left lower extremity pain since the time of  her discectomy.  Follow up study initially showed she had a good  decompression.  Subsequently, the patient began to complain of right lower  extremity pain and follow up study demonstrated that she had a disc  herniation at L3-L4 slightly centric to the right side.  Postoperative  changes were noted at the L5-S1 level and most recently, a diskogram was  performed which corroborated similar pains at L3-L4  and L5-S1.  L4-L5 was  negative for reproducing any symptoms.  After careful consideration of her  options and failing the passage of time and multiple conservative  treatments, we advised surgical intervention to decompress and stabilize L3-  L4 and L5-S1.   PROCEDURE:  The patient was brought to the operating room supine on the  stretcher.  After a smooth induction of general endotracheal anesthesia, she  was turned prone and the back was prepped with DuraPrep and draped in a  sterile fashion.  A midline incision was created and carried down to the  lumbodorsal fascia.  Subperiosteal dissections were performed in the  interlaminar spaces at L3-L4 out to the facet joints and at L5-S1 similarly  out to the facet joints.  A McCullough retractor was used to retract in each  of these areas, then a laminotomy was completed at the L5-S1 level on the  left side.  Some unusual tissue that was thickened and hardened was  encountered in the epidural space.  This was sent for pathologic evaluation.  It was sealed with the use of a BioGlue product.  Dissection was carried out  over the lateral aspect of the dural  tube and the S1 nerve root.  By gently  mobilizing the S1 nerve root medially, the disc space was identified to be  significantly scarred to the under surface of the nerve root and underneath  this, was noted a substantial bulge in the subligamentous space of the disc  at L5-S1.  This appeared to be displacing the S1 nerve root more medially  and superiorly and by dissecting through this, the disc space was entered  and a significant quantity of severely degenerated disc material was removed  from the L5-S1 space.  The S1 nerve root was untethered from the mass and  this allowed the S1 nerve root to lie in the more natural position flat  against the vertebral bodies.  Once all this tissue was resected, the disc  space was emptied of a substantial quantity of severely degenerated disc   material.  Contralateral interlaminar spreader was used early.  In the end,  the disc was completely evacuated of a substantial quantity of severely  degenerated disc material.  A series of disc shapers were then used to  remove the cartilaginous endplates and the dissection was carried out so  that an 8 mm disc spacer could be used in the disc space removing the  endplate.  Ultimately, a 9 mm PEEK bone spacer measuring 25 mm in length was  filled with OP1 and the patient's own bone that had been mixed, PEEK spacer  was tapped into position.  The intralaminar distractor was removed and care  was taken to make sure that none of the impacted OP1 and bone substance was  allowed to exude from the L3-L4 where a similar procedure was carried out,  this time on the right side where the patient was having a substantial  radiculopathic pain on that side.  Decompression of the L4 nerve root  inferiorly and the L3  nerve root superiorly was cautiously obtained.  Epidural venous bleeding was contained with the bipolar cautery and some  microscissors and microdissection technique to decompress these veins.  The  disc space was entered and the substantial subligamentous disc herniation  was removed causing the dural tube to lie more flatly across the disc space  and did provide decompression of the L4 nerve root.  This was opened up with  the contralateral intralaminar spreader on the left side and the dissection  was carried out so that the interspace was opened to 9 mm size.  A 10 mm  PEEK bone spacer was then filled with OP1 and the patient's own bone and  this was tapped into the interspace along with additional OP1 to fill the  void in the disc space.  The intralaminar spreader was removed.  The back  was allowed to assume a more natural position, and then two large spiral plates, the lateral gutters were then decorticated on the contralateral side  to the discectomy.  This was done at L3-L4 and  L4-L5.  The area was  copiously irrigated with antibiotic irrigating solution.  Hemostasis in the  soft tissues was obtained meticulously.  The lumbodorsal fascia was closed  with #1 Vicryl in an interrupted fashion, 2-0 Vicryl was used in the  subcutaneous tissues, 3-0 Vicryl subcuticular.  Dermabond was placed on  the  skin.  The patient tolerated the procedure well and was returned to the  recovery room in stable condition.      Stefani Dama, M.D.  Electronically Signed     HJE/MEDQ  D:  12/29/2004  T:  12/29/2004  Job:  [id number]

## 2010-07-22 NOTE — Assessment & Plan Note (Signed)
MEDICAL RECORD NUMBER:  [id number]   DATE OF BIRTH:  02-12-1966   DATE:  April 19, 2004   The patient follows up right sacral iliac joint injection done April 02, 2004.  She did not have very good relief with this injection, stating that  it was less than 25% relief, lasting for a day or so.  Her back pain is  rated 8/10, is worse with leaning backwards rather than leaning forwards.   CURRENT MEDICATIONS:  Advil, Tylenol, Wellbutrin, Celebrex, Norco.  She  states she is taking up to 10 Norco a day, and I cautioned her that this is  too much.  Has prescription dated March 21, 2004.   SOCIAL HISTORY:  Lives with her children, divorced.  Vocational history:  Not working.   REVIEW OF SYSTEMS:  No suicidal thoughts.   PHYSICAL EXAMINATION:  VITAL SIGNS:  Blood pressure 114/67, pulse 74,  respiratory rate 18, O2 saturation 99% on room air.  GENERAL:  No acute distress.  Mood and affect appropriate.  BACK:  She has good forward flexion, about 75% range, some pain when she  straightens back out and particularly more pain when she leans backwards.  She has only about 25% extension.  She has positive Febre's maneuver on the  right side but not on the left side.  Straight leg raising test is negative.  Lower extremity range of motion is full.  No pain with hip internal and  external rotation.   IMPRESSION:  Right-sided low back pain with poor response to sacroiliac  injection.  Will set her up for lumbar facet injections.  Will do right side  L5 dorsal ramus, L4 medial branch, L3 medial branch.      AEK/MedQ  D:  04/19/2004 15:37:22  T:  04/19/2004 17:15:32  Job #:  [id number]   cc:   Stefani Dama, M.D.  [address]  [place]  [place] [address]  Fax: [phone]   Ranelle Oyster, M.D.  [address] [place]  [place] [address]  Fax: [phone]

## 2010-07-22 NOTE — Discharge Summary (Signed)
NAME:  [name], [name]                             ACCOUNT NO.:  [id number]   MEDICAL RECORD NO.:  [id number]                   PATIENT TYPE:  INP   LOCATION:  A220                                 FACILITY:  [facility]   PHYSICIAN:  Dalia Heading, M.D.               DATE OF BIRTH:  11-16-1965   DATE OF ADMISSION:  12/12/2002  DATE OF DISCHARGE:  12/17/2002                                 DISCHARGE SUMMARY   HOSPITAL COURSE:  The patient is a 45 year old, white female who was in her  usual state of good health until December 12, 2002, when she began  experiencing significant mid substernal and epigastric chest pain.  It did  radiate to her left shoulder.  She was admitted to the hospital for further  evaluation and treatment.  She was noted to have mildly elevated liver  enzyme test at the time.  She did have a CT scan of the chest which was  negative for pulmonary embolus.  She had lower extremity ultrasound which  was negative for DVT.  She had an echocardiogram which was reportedly within  normal limits.  Her cardiac enzyme tests were all within normal limits.  Ultrasound of the gallbladder revealed cholecystitis.  Surgery consultation  was obtained.  The following day, repeat liver enzyme tests showed  significant elevation of her liver enzyme test.  She subsequently was taken  to the operating room on December 15, 2002, and underwent laparoscopic  cholecystectomy with cholangiograms.  The cholangiogram revealed a filling  defect in the distal portion of the common bile duct which did not allow the  dye to flow into the duodenum.  The gallbladder was removed along with the  stones.  Dr. Jena Gauss of gastroenterology was consulted and the following day,  the patient underwent an ERCP.  ERCP did not show any retained common bile  duct stone.  No leakage was noted.  The patient's diet was advanced without  difficulty after the ERCP.  The patient is being discharged home on December 17, 2002, in  good and improved condition.  Her amylase and lipase are  normal.  Her liver enzyme tests have almost returned to normal.  Her total  bilirubin is within normal limits.   FOLLOW UP:  The patient is to follow up with Dr. Franky Macho on December 23, 2002.  Follow-up appointment Dr. Nobie Putnam in two weeks.   DISCHARGE MEDICATIONS:  1. Vicodin one to two tablets p.o. q.4h. p.r.n. pain.  2. Prevacid 30 mg p.o. q.d.  3. Lopressor 12.5 mg p.o. b.i.d.  4. Lexapro 10 mg p.o. q.d.   DISCHARGE DIAGNOSES:  1. Cholecystitis, cholelithiasis.  2. Depression.  3. Hypertension.   PROCEDURES:  1. Laparoscopic cholecystectomy with cholangiograms on December 15, 2002.  2. Endoscopic retrograde cholangiopancreatography on December 16, 2002.     ___________________________________________  Dalia Heading, M.D.   MAJ/MEDQ  D:  12/17/2002  T:  12/17/2002  Job:  [id number]   cc:   Corrie Mckusick, M.D.  [address] [place] [place]  [place]  [place] [address]  Fax: [phone]   R. Roetta Sessions, M.D.  [address]  [place]  [place] [address]  Fax: [phone]   Dani Gobble, MD  [phone] [address]  [place]  [place] [address]  Fax: [phone]

## 2010-07-22 NOTE — Discharge Summary (Signed)
NAME:  [name], [name]                             ACCOUNT NO.:  [id number]   MEDICAL RECORD NO.:  [id number]                   PATIENT TYPE:  OIB   LOCATION:  5010                                 FACILITY:  [facility]   PHYSICIAN:  Stefani Dama, M.D.               DATE OF BIRTH:  10-Jul-1965   DATE OF ADMISSION:  05/01/2003  DATE OF DISCHARGE:  05/05/2003                                 DISCHARGE SUMMARY   ADMISSION DIAGNOSES:  Herniated nucleus pulposus, L5-S1, left lumbar  radiculopathy.   DISCHARGE DIAGNOSES:  Herniated nucleus pulposus, L5-S1, left lumbar  radiculopathy.   OPERATION:  L5-S1 left lumbar microendoscopic diskectomy with operating  microscope MET-RX system.   CONDITION ON DISCHARGE:  Improving.   HOSPITAL COURSE:  [name] is a 45 year old individual who has had severe  and excruciating back and left leg pain with weakness in the gastrocnemius.  She has a large extruded fragment of disk that was noted on an outpatient  basis. Because of the weakness, severity of the pain, and inability to get  any relief with conservative measures including strong oral narcotic  medications, she was hospitalized and taken to the operating room where she  underwent __________ of a large fragmented disk. Postoperatively, the  patient had persistent of her left lumbar radiculopathy in a S1  distribution. She notes that it was better in many qualities than it had  been, but it was still quite significant. She had been ambulatory. Her  incision remained clean and dry. Because of the severity of the pain, she  was maintained in the hospital and maintained on some parenteral narcotic  medication. She was then started on Medrol dose pack and also started on  Neurontin. This did help to alleviate the pain somewhat. She was weaned away  from the parenteral narcotic analgesics and started on oral Demerol which  she seemed to tolerate well. She did not tolerate Percocet very well because  of GI  upset.   DISCHARGE INSTRUCTIONS:  She is discharged home at this time with a  prescription for Demerol with Phenergan #60 without refills, completion of a  Medrol dose pack, Neurontin 300 mg b.i.d., and Flexeril as a muscle relaxer.  She will be seen in the office in two weeks' time for further followup.   FINAL DIAGNOSES:  Herniated nucleus pulposus, L5-S1, left lumbar  radiculopathy.                                                Stefani Dama, M.D.    Merla Riches  D:  05/05/2003  T:  05/05/2003  Job:  [id number]

## 2010-07-22 NOTE — Procedures (Signed)
   NAME:  [name], [name]                             ACCOUNT NO.:  [id number]   MEDICAL RECORD NO.:  [id number]                   PATIENT TYPE:  INP   LOCATION:  A220                                 FACILITY:  [facility]   PHYSICIAN:  Edward L. Juanetta Gosling, M.D.             DATE OF BIRTH:  07/31/1965   DATE OF PROCEDURE:  DATE OF DISCHARGE:                                EKG INTERPRETATION   DATE AND TIME OF TEST:  December 12, 2002 at 0524.   INTERPRETATION:  The rhythm is sinus rhythm with a rate of about 70.  Small  Q waves are seen inferiorly and clinical correlation as to significance is  suggested.  There are ST-T wave change which are nonspecific.      ___________________________________________                                            Oneal Deputy. Juanetta Gosling, M.D.   ELH/MEDQ  D:  12/13/2002  T:  12/13/2002  Job:  [id number]

## 2010-07-22 NOTE — H&P (Signed)
NAME:  [name], [name]                             ACCOUNT NO.:  [id number]   MEDICAL RECORD NO.:  [id number]                   PATIENT TYPE:  INP   LOCATION:  A220                                 FACILITY:  [facility]   PHYSICIAN:  Corrie Mckusick, M.D.               DATE OF BIRTH:  Jul 21, 1965   DATE OF ADMISSION:  12/12/2002  DATE OF DISCHARGE:                                HISTORY & PHYSICAL   ADMITTING DIAGNOSIS:  Chest pain.   PRIMARY PHYSICIAN:  Patrica Duel, M.D.   HISTORY OF PRESENTING ILLNESS:  This is a 45 year old white female with no  significant past medical history who presents with six hours of midsternal  and midepigastric sharp chest pain which radiated to the left shoulder.  She  came into the emergency department with this sharp pain that began at 1 a.m.  on the morning of admission.  It was associated with some mild shortness of  breath and, again, did radiate to her left shoulder and not radiating down  the left arm.  It was associated with some nausea.  Since she came into the  emergency department the pain has subsided and just now has a dull ache in  the mid chest.  The shortness of breath is not much of a component, and she  has no other respiratory symptomatology.   Respiratory and GU review of systems are all negative.  The patient does  have a family history of coronary disease but, otherwise, really negative  risk factors.   PAST MEDICAL HISTORY:  Depression.   PAST SURGICAL HISTORY:  1. Appendectomy.  2. Partial hysterectomy.  3. Tonsillectomy.   MEDICATIONS:  Lexapro 10 mg daily.   ALLERGIES:  PENICILLIN.   SOCIAL HISTORY:  Does not drink or smoke.  Did have a prior history of  smoking years ago.  Has two children.   FAMILY HISTORY:  Significant for midlife coronary disease in the males in  her family.  There is some congestive heart failure as well.  No diabetes  history except in her father.   PHYSICAL EXAM:  VITAL SIGNS:  Pulse 70,  respirations 20, blood pressure  142/81, O2 saturation 100% on room air.  GENERAL:  When I saw the patient she was pleasant, talkative, smiling, in no  acute distress.  HEENT:  Nasal and oropharynx are clear.  NECK:  Supple.  No lymphadenopathy, no JVD.  CHEST:  Clear to auscultation bilaterally.  CARDIOVASCULAR:  Regular rate and rhythm.  Normal S1, S2.  No S3, murmur,  gallops, or rubs.  ABDOMEN:  Bowel sounds positive.  Soft, nontender, nondistended.  With just  some mild midepigastric pain.  EXTREMITIES:  No cyanosis, no clubbing, no edema.   LABORATORIES:  White blood cell count 9.4, hemoglobin 13.1, hematocrit 38.2,  platelets 303.  Liver functions normal.  Sodium 136, potassium 3.8, chloride  104, bicarbonate 26,  BUN 7, creatinine 0.5.  AST 89, ALT 77.  CPK total is 25, CK-MB 0.6, troponin is negative at less than 0.01, d-dimer  is 0.22.   Chest x-ray:  Negative per the emergency room physician.   EKG:  Normal sinus rhythm.  Normal axis.  Normal intervals.  No acute ST  changes.   ASSESSMENT:  This is a 45 year old with chest pain, probably esophageal  spasm.   PLAN:  1. Admit for rule out myocardial infarction protocol.  2. CPK-MB, troponin q.8h. x3.  3. No need for chest CT to rule out PE as she is not short of breath and d-     dimer is negative.  4. Will start proton pump inhibitor.  5. Consult cardiology.  6. Will continue to monitor for the next 24 hours.     ___________________________________________                                         Corrie Mckusick, M.D.   JCG/MEDQ  D:  12/12/2002  T:  12/12/2002  Job:  [id number]

## 2010-07-22 NOTE — Op Note (Signed)
NAME:  [name], [name]                             ACCOUNT NO.:  [id number]   MEDICAL RECORD NO.:  [id number]                   PATIENT TYPE:  INP   LOCATION:  A220                                 FACILITY:  [facility]   PHYSICIAN:  Dalia Heading, M.D.               DATE OF BIRTH:  08-Dec-1965   DATE OF PROCEDURE:  12/15/2002  DATE OF DISCHARGE:                                 OPERATIVE REPORT   PREOPERATIVE DIAGNOSES:  Cholecystitis, cholelithiasis.   POSTOPERATIVE DIAGNOSES:  Cholecystitis, cholelithiasis,  choledocholithiasis.   PROCEDURE:  Laparoscopic cholecystectomy with cholangiogram.   SURGEON:  Dalia Heading, M.D.   ANESTHESIA:  General endotracheal.   INDICATIONS:  The patient is a 45 year old white female, who presented with  chest pain and epigastric pain.  She was noted to have an elevation of the  liver enzyme test.  Ultrasound of the gallbladder reveals cholelithiasis.  The common bile duct was within normal limits.  The patient now comes to the  operating room for laparoscopic cholecystectomy with cholangiograms.  The  risks and benefits of the procedure including bleeding, infection,  hepatobiliary injury, the possibility of finding a common bile duct stone,  and the possibility of an open procedure were fully explained to the  patient, who gave informed consent.   PROCEDURE NOTE:  The patient was placed in the supine position.  After  induction of general endotracheal anesthesia, the abdomen was prepped and  draped using the usual sterile technique with Betadine.  Surgical site  confirmation was performed.   A supraumbilical incision was made down to the fascia.  Veress needle was  introduced into the abdominal cavity, and confirmation of placement was done  using the saline drop test.  The abdomen was then insufflated to 16 mmHg  pressure.  An 11 mm trocar was introduced into the abdominal cavity under  direct visualization without difficulty.  The patient was  placed in the  reverse Trendelenburg position.  An additional 11 mm trocar was placed in  the epigastric region, and 5 mm trocars were placed in the right upper  quadrant and right flank regions.  The liver was inspected and noted to be  within normal limits.  The gallbladder was retracted superiorly and  laterally.  The dissection was begun around the infundibulum of the  gallbladder.  The cystic duct was first identified.  Its junction to the  infundibulum fully identified.  A single Endo Clip was placed proximally on  the cystic duct.  An incision was made into the cystic duct.  Multiple small  stones were noted to emanate from the hole in the cystic duct.  Cholangiocatheter was then inserted.  Under digital fluoroscopy,  cholangiograms were performed.  Dye flowed freely into the hepatobiliary  tree.  There also appeared to be possibly a cystic duct of Luschka.  There  was a cutoff noted in the  distal portion of the common bile duct.  Dye did  not flow into the duodenum.  Despite flushing the system, I was unable to  dislodge the stone, or stones.  The cystic duct was becoming frayed, thus  the cholangiocatheter was removed, and multiple Endo Clips were placed  distally on the cystic duct and the cystic duct was divided.  The cystic  artery was likewise divided and ligated.  The gallbladder was freed away  from the gallbladder fossa using Bovie electrocautery.  The gallbladder was  delivered through the epigastric trocar site using an EndoCatch bag.  The  gallbladder fossa was inspected, and no abnormal bleeding or bile leakage  was noted.  Surgicel was placed in the gallbladder fossa.  The subhepatic  space as well as right hepatic gutter were irrigated with normal saline.  All fluid and air were then evacuated from the abdominal cavity prior to  removal of the trocars.   All wounds were irrigated with normal saline.  All wounds were injected with  0.5% Sensorcaine.  The  supraumbilical fascia was reapproximated using an 0  Vicryl interrupted suture.  All skin incisions were closed using staples.  Betadine, ointment, and dry sterile dressings were applied.   All tape and needle counts were correct at the end of the procedure.  The  patient was extubated in the operating room and went back to the recovery  room awake in stable condition.  Complications none.  Specimen gallbladder  with stones.  Blood loss minimal.      ___________________________________________                                            Dalia Heading, M.D.   MAJ/MEDQ  D:  12/15/2002  T:  12/15/2002  Job:  [id number]   cc:   R. Roetta Sessions, M.D.  [address]  [place]  [place] [address]  Fax: [phone]   Dani Gobble, MD  [phone] [address]  [place]  [place] [address]  Fax: [phone]   Corrie Mckusick, M.D.  [address] [place]. [place]  [place]  [place] [address]  Fax: [phone]

## 2010-07-22 NOTE — Op Note (Signed)
NAME:  [name], [name] [name]                             ACCOUNT NO.:  [id number]   MEDICAL RECORD NO.:  [id number]                   PATIENT TYPE:  INP   LOCATION:  A220                                 FACILITY:  [facility]   PHYSICIAN:  R. Roetta Sessions, M.D.              DATE OF BIRTH:  1965-12-09   DATE OF PROCEDURE:  12/16/2002  DATE OF DISCHARGE:                                 OPERATIVE REPORT   PROCEDURE:  Endoscopic retrograde cholangiopancreatography.   INDICATIONS FOR PROCEDURE:  The patient is a 46 year old lady who originally  was hospitalized with acute cholecystitis.  She is status post laparoscopic  cholecystectomy yesterday.  She had a bump in her bilirubin and  transaminase.  Intraoperative cholangiogram revealed no flow into the  duodenum.  Her LFTs have remained elevated although they have decreased some  on today's assays.  ERCP is now being done to further evaluate this lady for  likely common bile duct stone.  This approach has been discussed with the  patient and the patient's mother at some length.  Potential risks, benefits  and alternatives have been reviewed.  We specifically talked about the 1/10  chance of pancreatitis, perforation, reaction to medications.  Questions  were answered.  She is agreeable.  Please see the documentation in the  medical record.   DESCRIPTION OF PROCEDURE:  The patient was placed in the semi-prone position  on the fluoroscopy table.  Oxygen saturation and blood pressure, pulse, and  respirations were monitored throughout the entire procedure.  The patient  was on IV Rocephin.   CONSCIOUS SEDATION:  Demerol 100 mg IV, Versed 4 mg IV, Phenergan 25 mg IV  prior to the procedure. Cetacaine spray for topical oropharyngeal  anesthesia.   INSTRUMENT:  Olympus video tip therapeutic duodenoscope.   FINDINGS:  Cursory examination of the distal esophagus, stomach and duodenum  through the second portion demonstrated no abnormalities aside from a  single  superficial antral erosion.  The ampulla was rarely identified on the medial  [name] of the second portion of the duodenum.  The scope was put in the short  position 55 cm from the incisors.  Scout film was taken.  Using the  Microvasive sphincterotome, the ampulla was approached through which point a  35 guide wire was used to palpate for the bile duct.  Bile duct lumen was  easily found with the wire under fluoroscopic control and the sphincter and  catheter followed up achieving a deep cannulation.  Cholangiogram was  obtained. The bile duct was not dilated. There were no filling defects.  Intrahepatic bile ducts appeared normal.  The patient is status post  laparoscopic cholecystectomy and no evidence of biliary leak.  The duct  appeared to drain well spontaneously.  Dr. Molli [name] was present for the  fluoroscopy portion.  A 15-minute delayed film is pending.  The patient  tolerated the procedure  well and is reactive at the end of the fluoroscopy.   IMPRESSION:  1. Normal-appearing ampulla.  2. Normal-appearing residual biliary trees, status post laparoscopic     cholecystectomy.  3. Pancreatic duct noninjected.   I suspect the patient has passed a small stone since her surgery yesterday.   RECOMMENDATIONS:  Advance diet as tolerated.  Recheck labs tomorrow morning.   My findings and recommendations have been discussed with Dr. Lovell [name].      ___________________________________________                                            [name] [name], M.D.   RMR/MEDQ  D:  12/16/2002  T:  12/16/2002  Job:  [id number]   cc:   [name] [name], M.D.  [address] [place]. [place]  [place] [address]  Fax: [phone]   [name] [name], M.D.  [address] [place]  [place] [address]  Fax: [phone]

## 2010-11-14 ENCOUNTER — Emergency Department (HOSPITAL_COMMUNITY)
Admission: EM | Admit: 2010-11-14 | Discharge: 2010-11-14 | Disposition: A | Discharge status: Home / Self Care | Payer: BC Managed Care – PPO | Attending: Emergency Medicine | Admitting: Emergency Medicine

## 2010-11-14 ENCOUNTER — Other Ambulatory Visit: Payer: Self-pay

## 2010-11-14 ENCOUNTER — Emergency Department (HOSPITAL_COMMUNITY): Payer: BC Managed Care – PPO

## 2010-11-14 DIAGNOSIS — F3289 Other specified depressive episodes: Secondary | ICD-10-CM | POA: Insufficient documentation

## 2010-11-14 DIAGNOSIS — R109 Unspecified abdominal pain: Secondary | ICD-10-CM | POA: Insufficient documentation

## 2010-11-14 DIAGNOSIS — I498 Other specified cardiac arrhythmias: Secondary | ICD-10-CM | POA: Insufficient documentation

## 2010-11-14 DIAGNOSIS — R079 Chest pain, unspecified: Secondary | ICD-10-CM | POA: Insufficient documentation

## 2010-11-14 DIAGNOSIS — R112 Nausea with vomiting, unspecified: Secondary | ICD-10-CM

## 2010-11-14 DIAGNOSIS — F329 Major depressive disorder, single episode, unspecified: Secondary | ICD-10-CM | POA: Insufficient documentation

## 2010-11-14 DIAGNOSIS — R197 Diarrhea, unspecified: Secondary | ICD-10-CM | POA: Insufficient documentation

## 2010-11-14 HISTORY — DX: Depression, unspecified: F32.A

## 2010-11-14 HISTORY — DX: Major depressive disorder, single episode, unspecified: F32.9

## 2010-11-14 LAB — DIFFERENTIAL
Basophils Absolute: 0 10*3/uL (ref 0.0–0.1)
Basophils Relative: 0 % (ref 0–1)
Eosinophils Absolute: 0.1 10*3/uL (ref 0.0–0.7)
Eosinophils Relative: 1 % (ref 0–5)
Lymphs Abs: 0.8 10*3/uL (ref 0.7–4.0)
Monocytes Absolute: 0.5 10*3/uL (ref 0.1–1.0)
Monocytes Relative: 5 % (ref 3–12)
Neutro Abs: 8.3 10*3/uL — ABNORMAL HIGH (ref 1.7–7.7)
Neutrophils Relative %: 86 % — ABNORMAL HIGH (ref 43–77)

## 2010-11-14 LAB — CBC
HCT: 40.6 % (ref 36.0–46.0)
Hemoglobin: 13.5 g/dL (ref 12.0–15.0)
MCV: 88.1 fL (ref 78.0–100.0)
RDW: 13.3 % (ref 11.5–15.5)

## 2010-11-14 LAB — COMPREHENSIVE METABOLIC PANEL
Alkaline Phosphatase: 163 U/L — ABNORMAL HIGH (ref 39–117)
BUN: 9 mg/dL (ref 6–23)
GFR calc Af Amer: >60 mL/min (ref >60)
Glucose, Bld: 116 mg/dL — ABNORMAL HIGH (ref 70–99)
Potassium: 3.8 mEq/L (ref 3.5–5.1)

## 2010-11-14 LAB — POCT I-STAT TROPONIN I: Troponin i, poc: 0 ng/mL (ref 0.00–0.08)

## 2010-11-14 LAB — LIPASE, BLOOD: Lipase: 25 U/L (ref 11–59)

## 2010-11-14 MED ORDER — SODIUM CHLORIDE 0.9 % IV SOLN
Freq: Once | INTRAVENOUS | Status: AC
  Administered 2010-11-14: 09:00:00 via INTRAVENOUS

## 2010-11-14 MED ORDER — MORPHINE SULFATE 4 MG/ML IJ SOLN
2.0000 mg | Freq: Once | INTRAMUSCULAR | Status: AC
  Administered 2010-11-14: 2 mg via INTRAVENOUS
  Filled 2010-11-14: qty 1

## 2010-11-14 MED ORDER — PROMETHAZINE HCL 25 MG/ML IJ SOLN
12.5000 mg | Freq: Once | INTRAMUSCULAR | Status: AC
  Administered 2010-11-14: 12.5 mg via INTRAVENOUS
  Filled 2010-11-14: qty 1

## 2010-11-14 MED ORDER — SODIUM CHLORIDE 0.9 % IV BOLUS (SEPSIS)
500.0000 mL | Freq: Once | INTRAVENOUS | Status: AC
  Administered 2010-11-14: 09:00:00 via INTRAVENOUS

## 2010-11-14 MED ORDER — SODIUM CHLORIDE 0.9 % IV BOLUS (SEPSIS)
500.0000 mL | Freq: Once | INTRAVENOUS | Status: AC
  Administered 2010-11-14: 500 mL via INTRAVENOUS

## 2010-11-14 MED ORDER — PROMETHAZINE HCL 25 MG PO TABS: 25.0000 mg | ORAL_TABLET | Freq: Four times a day (QID) | ORAL | Status: DC | PRN

## 2010-11-14 MED ORDER — METOCLOPRAMIDE HCL 5 MG/ML IJ SOLN
10.0000 mg | Freq: Once | INTRAMUSCULAR | Status: AC
  Administered 2010-11-14: 10 mg via INTRAVENOUS
  Filled 2010-11-14: qty 2

## 2010-11-14 MED ORDER — ONDANSETRON HCL 4 MG/2ML IJ SOLN
4.0000 mg | Freq: Once | INTRAMUSCULAR | Status: AC
  Administered 2010-11-14: 4 mg via INTRAVENOUS
  Filled 2010-11-14: qty 2

## 2010-11-14 NOTE — ED Notes (Signed)
Pt still c/o pain and nausea. New orders received and given. Will reevaluate pt in 15 minutes.

## 2010-11-14 NOTE — ED Notes (Signed)
Pt woke up this am at 5am with CP and vomiting. Pt states pain is in left side of chest. No radiation present. No sob. Pt vomited x 3 before arrival. Pt a/o x3.

## 2010-11-14 NOTE — ED Provider Notes (Signed)
History     CSN: [id number] Arrival date & time: 11/14/2010  8:06 AM  Chief Complaint  Patient presents with  . Chest Pain  . Emesis   HPI Comments: Patient c/o gradual onset of nausea that began this morning at 2:00 am with chest pain and vomiting that developed at 5:00 am.  Describes the chest pain as dull with waxing and waning sharp pains in the left substernal area.  Had 2-3 episodes of vomiting this morning as well that began with the chest pain.  She also had small amt of dark, loose stool.  Also c/o epigastric pain that does not radiate.  She denies shortness of breath, numbness or sweating.    Patient is a 45 y.o. female presenting with chest pain and vomiting. The history is provided by the patient and the spouse.  Chest Pain The chest pain began 6 - 12 hours ago. Chest pain occurs constantly. The chest pain is unchanged. Associated with: vomiting. The severity of the pain is moderate. The quality of the pain is described as aching and sharp. The pain does not radiate. Primary symptoms include abdominal pain, nausea and vomiting. Pertinent negatives for primary symptoms include no fever, no fatigue, no syncope, no shortness of breath, no cough, no wheezing, no palpitations, no dizziness and no altered mental status.  The abdominal pain began today. The abdominal pain has been unchanged since its onset. The abdominal pain is located in the epigastric region. The abdominal pain does not radiate. The abdominal pain is relieved by nothing.  The vomiting began today. Vomiting occurs 2 to 5 times per day. The emesis contains undigested food and bilious material.  Pertinent negatives for associated symptoms include no diaphoresis, no lower extremity edema, no near-syncope, no numbness and no weakness. She tried nothing for the symptoms.  Pertinent negatives for past medical history include no CAD, no COPD, no diabetes, no hyperlipidemia, no PE and no recent injury.  Procedure history is negative  for cardiac catheterization.    Emesis  Associated symptoms include abdominal pain and diarrhea. Pertinent negatives include no chills, no cough and no fever.    Past Medical History  Diagnosis Date  . Depression     Past Surgical History  Procedure Date  . Appendectomy   . Cholecystectomy   . Tonsillectomy   . Abdominal hysterectomy     History reviewed. No pertinent family history.  History  Substance Use Topics  . Smoking status: Never Smoker   . Smokeless tobacco: Not on file  . Alcohol Use: No    OB History    Grav Para Term Preterm Abortions TAB SAB Ect Mult Living                  Review of Systems  Constitutional: Negative for fever, chills, diaphoresis, activity change and fatigue.  HENT: Negative for neck pain and neck stiffness.   Respiratory: Negative for cough, chest tightness, shortness of breath and wheezing.   Cardiovascular: Positive for chest pain. Negative for palpitations, syncope and near-syncope.  Gastrointestinal: Positive for nausea, vomiting, abdominal pain and diarrhea. Negative for blood in stool.  Genitourinary: Negative for dysuria.  Musculoskeletal: Negative.   Skin: Negative.   Neurological: Negative for dizziness, weakness and numbness.  Psychiatric/Behavioral: Negative for altered mental status.  All other systems reviewed and are negative.    Physical Exam  BP 135/76  Pulse 90  Temp(Src) 98.9 F (37.2 C) (Oral)  Resp 18  Ht 5\' 6"  (1.676 m)  Wt 200 lb (90.719 kg)  BMI 32.28 kg/m2  SpO2 97%  Physical Exam  Nursing note and vitals reviewed. Constitutional: She is oriented to person, place, and time. She appears well-developed and well-nourished. No distress.  HENT:  Head: Normocephalic and atraumatic.  Mouth/Throat: Oropharynx is clear and moist.  Eyes: EOM are normal. Pupils are equal, round, and reactive to light.  Neck: Normal range of motion. Neck supple. No JVD present. No tracheal deviation present. No  thyromegaly present.  Cardiovascular: Normal rate, regular rhythm, normal heart sounds and intact distal pulses.   No murmur heard. Pulmonary/Chest: Effort normal and breath sounds normal. Chest wall is not dull to percussion. She exhibits no crepitus and no swelling.  Abdominal: Soft. She exhibits no distension and no mass. There is tenderness. There is no rebound and no guarding.  Musculoskeletal: She exhibits no edema and no tenderness.  Lymphadenopathy:    She has no cervical adenopathy.  Neurological: She is alert and oriented to person, place, and time. No cranial nerve deficit or sensory deficit. She exhibits normal muscle tone. Coordination normal.  Skin: Skin is warm and dry.  Psychiatric: She has a normal mood and affect.    ED Course  Procedures  MDM   ED ECG REPORT   Date: 11/14/2010  EKG Time: 8:31 AM  Rate: 89  Rhythm: normal sinus rhythm and sinus arrhythmia  Axis: normal  Intervals:none  ST&T Change: none  Narrative Interpretation: no change from 12/26/04      Dg Chest Portable 1 View  11/14/2010  *RADIOLOGY REPORT*  Clinical Data: Chest pain.  Nausea and vomiting.  PORTABLE CHEST - 1 VIEW  Comparison: 09/25/2009.  Findings: Heart size top normal. No infiltrate, congestive heart failure or pneumothorax.  IMPRESSION: No infiltrate, congestive heart failure or pneumothorax.  Original Report Authenticated By: Fuller Canada, M.D.     Results for orders placed during the hospital encounter of 11/14/10  CBC      Component Value Range   WBC 9.7  4.0 - 10.5 (K/uL)   RBC 4.61  3.87 - 5.11 (MIL/uL)   Hemoglobin 13.5  12.0 - 15.0 (g/dL)   HCT 16.1  09.6 - 04.5 (%)   MCV 88.1  78.0 - 100.0 (fL)   MCH 29.3  26.0 - 34.0 (pg)   MCHC 33.3  30.0 - 36.0 (g/dL)   RDW 40.9  81.1 - 91.4 (%)   Platelets 284  150 - 400 (K/uL)  DIFFERENTIAL      Component Value Range   Neutrophils Relative 86 (*) 43 - 77 (%)   Neutro Abs 8.3 (*) 1.7 - 7.7 (K/uL)   Lymphocytes Relative 8  (*) 12 - 46 (%)   Lymphs Abs 0.8  0.7 - 4.0 (K/uL)   Monocytes Relative 5  3 - 12 (%)   Monocytes Absolute 0.5  0.1 - 1.0 (K/uL)   Eosinophils Relative 1  0 - 5 (%)   Eosinophils Absolute 0.1  0.0 - 0.7 (K/uL)   Basophils Relative 0  0 - 1 (%)   Basophils Absolute 0.0  0.0 - 0.1 (K/uL)  COMPREHENSIVE METABOLIC PANEL      Component Value Range   Sodium 138  135 - 145 (mEq/L)   Potassium 3.8  3.5 - 5.1 (mEq/L)   Chloride 102  96 - 112 (mEq/L)   CO2 26  19 - 32 (mEq/L)   Glucose, Bld 116 (*) 70 - 99 (mg/dL)   BUN 9  6 - 23 (mg/dL)  Creatinine, Ser 0.59  0.50 - 1.10 (mg/dL)   Calcium 9.2  8.4 - 16.1 (mg/dL)   Total Protein 7.6  6.0 - 8.3 (g/dL)   Albumin 3.7  3.5 - 5.2 (g/dL)   AST 28  0 - 37 (U/L)   ALT 28  0 - 35 (U/L)   Alkaline Phosphatase 163 (*) 39 - 117 (U/L)   Total Bilirubin 0.5  0.3 - 1.2 (mg/dL)   GFR calc non Af Amer >60  >60 (mL/min)   GFR calc Af Amer >60  >60 (mL/min)  LIPASE, BLOOD      Component Value Range   Lipase 25  11 - 59 (U/L)  POCT I-STAT TROPONIN I      Component Value Range   Troponin i, poc 0.00  0.00 - 0.08 (ng/mL)   Comment 3                 11:51 AM Patient is feeling better, has ate ice chips and ambulated to the restroom w/o difficulty.  I have discussed pt hx, diagnostics and care plan with the EDP.  Sx's today are likely related to GI upset.  Clinical suspicion for cardiac event is low.  Pt agrees to rest, clear fluids today and to f/u with her PMD this week for recheck.  I have reviewed the nursing notes and vitals.      Patient / Family / Caregiver understand and agree with initial ED impression and plan with expectations set for ED visit.        Medical screening examination/treatment/procedure(s) were performed by non-physician practitioner and as supervising physician I was immediately available for consultation/collaboration. Osvaldo Human, M.D.      Tammy L. Vardaman, [place] 11/14/10 1156  Carleene Cooper III, MD 11/16/10 [phone]

## 2011-02-22 ENCOUNTER — Other Ambulatory Visit: Payer: Self-pay

## 2011-02-22 ENCOUNTER — Emergency Department (HOSPITAL_COMMUNITY)
Admission: EM | Admit: 2011-02-22 | Discharge: 2011-02-22 | Disposition: A | Discharge status: Home / Self Care | Payer: BC Managed Care – PPO | Attending: Emergency Medicine | Admitting: Emergency Medicine

## 2011-02-22 ENCOUNTER — Emergency Department (HOSPITAL_COMMUNITY): Payer: BC Managed Care – PPO

## 2011-02-22 ENCOUNTER — Encounter (HOSPITAL_COMMUNITY): Payer: Self-pay | Admitting: *Deleted

## 2011-02-22 DIAGNOSIS — R110 Nausea: Secondary | ICD-10-CM | POA: Insufficient documentation

## 2011-02-22 DIAGNOSIS — R030 Elevated blood-pressure reading, without diagnosis of hypertension: Secondary | ICD-10-CM | POA: Insufficient documentation

## 2011-02-22 DIAGNOSIS — R079 Chest pain, unspecified: Secondary | ICD-10-CM | POA: Insufficient documentation

## 2011-02-22 DIAGNOSIS — R002 Palpitations: Secondary | ICD-10-CM

## 2011-02-22 DIAGNOSIS — R0602 Shortness of breath: Secondary | ICD-10-CM | POA: Insufficient documentation

## 2011-02-22 DIAGNOSIS — Z9079 Acquired absence of other genital organ(s): Secondary | ICD-10-CM | POA: Insufficient documentation

## 2011-02-22 DIAGNOSIS — F3289 Other specified depressive episodes: Secondary | ICD-10-CM | POA: Insufficient documentation

## 2011-02-22 DIAGNOSIS — F329 Major depressive disorder, single episode, unspecified: Secondary | ICD-10-CM | POA: Insufficient documentation

## 2011-02-22 HISTORY — DX: Essential (primary) hypertension: I10

## 2011-02-22 LAB — DIFFERENTIAL
Basophils Absolute: 0 10*3/uL (ref 0.0–0.1)
Basophils Relative: 0 % (ref 0–1)
Lymphocytes Relative: 24 % (ref 12–46)
Neutro Abs: 6.1 10*3/uL (ref 1.7–7.7)
Neutrophils Relative %: 68 % (ref 43–77)

## 2011-02-22 LAB — CBC
MCHC: 32.8 g/dL (ref 30.0–36.0)
Platelets: 285 10*3/uL (ref 150–400)
RDW: 13.8 % (ref 11.5–15.5)
WBC: 9 10*3/uL (ref 4.0–10.5)

## 2011-02-22 LAB — CARDIAC PANEL(CRET KIN+CKTOT+MB+TROPI)
CK, MB: 1.4 ng/mL (ref 0.3–4.0)
Relative Index: INVALID (ref 0.0–2.5)
Relative Index: INVALID (ref 0.0–2.5)
Total CK: 29 U/L (ref 7–177)
Total CK: 24 U/L (ref 7–177)

## 2011-02-22 LAB — BASIC METABOLIC PANEL
CO2: 25 mEq/L (ref 19–32)
Chloride: 103 mEq/L (ref 96–112)
GFR calc Af Amer: >90 mL/min (ref >90)
Sodium: 136 mEq/L (ref 135–145)

## 2011-02-22 MED ORDER — MORPHINE SULFATE 4 MG/ML IJ SOLN
INTRAMUSCULAR | Status: AC
  Filled 2011-02-22: qty 1

## 2011-02-22 MED ORDER — ONDANSETRON HCL 4 MG/2ML IJ SOLN
INTRAMUSCULAR | Status: AC
  Administered 2011-02-22: 4 mg via INTRAVENOUS
  Filled 2011-02-22: qty 2

## 2011-02-22 MED ORDER — KETOROLAC TROMETHAMINE 30 MG/ML IJ SOLN
30.0000 mg | Freq: Once | INTRAMUSCULAR | Status: AC
  Administered 2011-02-22: 30 mg via INTRAVENOUS

## 2011-02-22 MED ORDER — FAMOTIDINE 20 MG PO TABS
20.0000 mg | ORAL_TABLET | Freq: Once | ORAL | Status: AC
  Administered 2011-02-22: 20 mg via ORAL
  Filled 2011-02-22: qty 1

## 2011-02-22 MED ORDER — PANTOPRAZOLE SODIUM 40 MG IV SOLR
40.0000 mg | Freq: Once | INTRAVENOUS | Status: AC
  Administered 2011-02-22: 40 mg via INTRAVENOUS
  Filled 2011-02-22: qty 40

## 2011-02-22 MED ORDER — ONDANSETRON HCL 4 MG/2ML IJ SOLN
4.0000 mg | Freq: Once | INTRAMUSCULAR | Status: AC
  Administered 2011-02-22: 4 mg via INTRAVENOUS

## 2011-02-22 MED ORDER — MORPHINE SULFATE 4 MG/ML IJ SOLN
4.0000 mg | Freq: Once | INTRAMUSCULAR | Status: AC
  Administered 2011-02-22: 4 mg via INTRAVENOUS

## 2011-02-22 MED ORDER — KETOROLAC TROMETHAMINE 30 MG/ML IJ SOLN
INTRAMUSCULAR | Status: AC
  Filled 2011-02-22: qty 1

## 2011-02-22 MED ORDER — HYDROCODONE-ACETAMINOPHEN 5-325 MG PO TABS: 1.0000 | ORAL_TABLET | ORAL | Status: AC | PRN

## 2011-02-22 NOTE — ED Provider Notes (Addendum)
History   This chart was scribed for Donnetta Hutching, MD by Melba Coon. The patient was seen in room APA18/APA18 and the patient's care was started at 2:40 PM.    CSN: [id number] Arrival date & time: 02/22/2011  2:15 PM   First MD Initiated Contact with Patient 02/22/11 1438      Chief Complaint  Patient presents with  . Nausea  . Dizziness  . Tachycardia  . Shoulder Pain    (Consider location/radiation/quality/duration/timing/severity/associated sxs/prior treatment) The history is limited by the absence of a caregiver.   [name] is a 45 y.o. female who presents to the Emergency Department complaining of constant, moderate to severe palpitations with waxing and waning moderate to severe chest pain, with associated SOB, sweating, and nausea. Pt was going to work around an hr ago when she started to experience her heart racing. It lasted for 15 minutes and then stopped, but pt "doesn't feel herself." The chest pain is sharp, then dull. Pt does not smoke or drink.  PCP: [facility]  Past Medical History  Diagnosis Date  . Depression   . Hypertension     Past Surgical History  Procedure Date  . Appendectomy   . Cholecystectomy   . Tonsillectomy   . Abdominal hysterectomy   . Lumbar     ruptured disc in lumbar taken out  . Back surgery     History reviewed. No pertinent family history.  History  Substance Use Topics  . Smoking status: Never Smoker   . Smokeless tobacco: Not on file  . Alcohol Use: No   Work: Merchandiser, retail  OB History    Grav Para Term Preterm Abortions TAB SAB Ect Mult Living                  Review of Systems 10 Systems reviewed and are negative for acute change except as noted in the HPI.  Allergies  Penicillins  Home Medications   Current Outpatient Rx  Name Route Sig Dispense Refill  . HYDROCHLOROTHIAZIDE 12.5 MG PO CAPS Oral Take 12.5 mg by mouth daily.      . ACETAMINOPHEN ER 650 MG PO TBCR Oral Take 650 mg by mouth every 8  (eight) hours as needed. For headache     . IBUPROFEN 200 MG PO TABS Oral Take 200 mg by mouth every 6 (six) hours as needed. For pain     . VENLAFAXINE HCL 37.5 MG PO TABS Oral Take 37.5 mg by mouth daily.        BP 140/90  Pulse 74  Temp(Src) 97.9 F (36.6 C) (Oral)  Resp 20  Ht 5\' 7"  (1.702 m)  Wt 200 lb (90.719 kg)  BMI 31.32 kg/m2  SpO2 100%  Physical Exam  Nursing note and vitals reviewed. Constitutional: She is oriented to person, place, and time. She appears well-developed and well-nourished. No distress.  HENT:  Head: Normocephalic and atraumatic.  Eyes: EOM are normal. Pupils are equal, round, and reactive to light.  Neck: Neck supple. No tracheal deviation present.  Cardiovascular: Normal rate, regular rhythm and normal heart sounds.   No murmur heard.      Slightly elevated blood pressure.  Pulmonary/Chest: Effort normal. No respiratory distress.  Abdominal: Soft. Bowel sounds are normal. She exhibits no distension.  Musculoskeletal: Normal range of motion. She exhibits no edema.  Neurological: She is alert and oriented to person, place, and time. No sensory deficit.  Skin: Skin is warm and dry.  Psychiatric: She has  a normal mood and affect. Her behavior is normal.    ED Course  Procedures (including critical care time)  DIAGNOSTIC STUDIES: Oxygen Saturation is 100% on room air, normal by my interpretation.    COORDINATION OF CARE:  2:40PM - Dr discusses with pt that she should stay and be monitored for a period of time.  Date: 02/22/2011  Rate: 68  Rhythm: normal sinus rhythm  QRS Axis: normal  Intervals: normal  ST/T Wave abnormalities: normal  Conduction Disutrbances:none  Narrative Interpretation:   Old EKG Reviewed: none available and changes noted    Labs Reviewed - No data to display No results found.   No diagnosis found.    MDM  History suggestive of transient tachycardia.  Monitor shows normal sinus rhythm at this time. Patient  looks well. Will do screening tests. Most likely discharge home with follow up for Holter monitor    I personally performed the services described in this documentation, which was scribed in my presence. The recorded information has been reviewed and considered.      Donnetta Hutching, MD 02/22/11 1535  Donnetta Hutching, MD 03/15/11 1551  Donnetta Hutching, MD 03/15/11 [phone]

## 2011-02-22 NOTE — ED Notes (Signed)
Dr. Colon Branch talking with pt and family

## 2011-02-22 NOTE — ED Notes (Addendum)
Pt states that she had been to visit her mother's and was walking back to her house when she began to feel nausea, dizziness, pt states that she felt her heart beating real fast and that she felt like she was going to pass out, pt experience left shoulder and left arm pain with this episode as well, pt has had problems with her left arm and shoulder, did receive a "shot" last Friday for same, pt states that the left shoulder and arm pain became worse with the fast hr, pt states that she has started to begin to experience chest pain located on left side of chest along with arm and shoulder pain since arrival to er, pt also experiencing nausea in er, pt describes the pain as being constant now, sharp and dull in nature

## 2011-02-22 NOTE — ED Notes (Signed)
Pt states that the burning and indigestion sensation is starting again, Dr. Colon Branch notified,

## 2011-02-22 NOTE — ED Notes (Signed)
Pt states that she is still having left side chest pain but that the fullness in epigastric area and indigestion is better,

## 2011-02-22 NOTE — Consults (Signed)
1600 Assumed care/disposition of patient who presented with left sided chest pain that was atypical. First cardiac troponin negative. Awaiting second marker.   1830 Second marker is negative. Reviewed information with patient and her husband. She will follow up with PCP, Dr Margo Aye. Discussed possible use of holter or event monitor as further work up that may be scheduled as an outpatient. Results for orders placed during the hospital encounter of 02/22/11  CBC      Component Value Range   WBC 9.0  4.0 - 10.5 (K/uL)   RBC 4.44  3.87 - 5.11 (MIL/uL)   Hemoglobin 13.1  12.0 - 15.0 (g/dL)   HCT 16.1  09.6 - 04.5 (%)   MCV 89.9  78.0 - 100.0 (fL)   MCH 29.5  26.0 - 34.0 (pg)   MCHC 32.8  30.0 - 36.0 (g/dL)   RDW 40.9  81.1 - 91.4 (%)   Platelets 285  150 - 400 (K/uL)  DIFFERENTIAL      Component Value Range   Neutrophils Relative 68  43 - 77 (%)   Neutro Abs 6.1  1.7 - 7.7 (K/uL)   Lymphocytes Relative 24  12 - 46 (%)   Lymphs Abs 2.1  0.7 - 4.0 (K/uL)   Monocytes Relative 7  3 - 12 (%)   Monocytes Absolute 0.6  0.1 - 1.0 (K/uL)   Eosinophils Relative 1  0 - 5 (%)   Eosinophils Absolute 0.1  0.0 - 0.7 (K/uL)   Basophils Relative 0  0 - 1 (%)   Basophils Absolute 0.0  0.0 - 0.1 (K/uL)  BASIC METABOLIC PANEL      Component Value Range   Sodium 136  135 - 145 (mEq/L)   Potassium 4.4  3.5 - 5.1 (mEq/L)   Chloride 103  96 - 112 (mEq/L)   CO2 25  19 - 32 (mEq/L)   Glucose, Bld 90  70 - 99 (mg/dL)   BUN 13  6 - 23 (mg/dL)   Creatinine, Ser 7.82  0.50 - 1.10 (mg/dL)   Calcium 9.1  8.4 - 95.6 (mg/dL)   GFR calc non Af Amer >90  >90 (mL/min)   GFR calc Af Amer >90  >90 (mL/min)  CARDIAC PANEL(CRET KIN+CKTOT+MB+TROPI)      Component Value Range   Total CK 29  7 - 177 (U/L)   CK, MB 1.4  0.3 - 4.0 (ng/mL)   Troponin I <0.30  <0.30 (ng/mL)   Relative Index RELATIVE INDEX IS INVALID  0.0 - 2.5   CARDIAC PANEL(CRET KIN+CKTOT+MB+TROPI)      Component Value Range   Total CK 24  7 - 177 (U/L)     CK, MB 1.6  0.3 - 4.0 (ng/mL)   Troponin I <0.30  <0.30 (ng/mL)   Relative Index RELATIVE INDEX IS INVALID  0.0 - 2.5

## 2011-02-22 NOTE — ED Notes (Signed)
See triage note,  

## 2011-02-22 NOTE — ED Notes (Signed)
Pt states that the nausea is better, pain only came down to a 8, Dr. Adriana Simas notified, additional orders given

## 2011-02-22 NOTE — ED Notes (Signed)
Pt states that her pain is better, but now c/o indigestion and pressure in epigastric area, Dr. Colon Branch notified,

## 2011-10-21 ENCOUNTER — Encounter (HOSPITAL_COMMUNITY): Payer: Self-pay | Admitting: Adult Health

## 2011-10-21 ENCOUNTER — Emergency Department (HOSPITAL_COMMUNITY): Payer: Self-pay

## 2011-10-21 ENCOUNTER — Emergency Department (HOSPITAL_COMMUNITY)
Admission: EM | Admit: 2011-10-21 | Discharge: 2011-10-21 | Disposition: A | Discharge status: Home / Self Care | Payer: Self-pay | Attending: Emergency Medicine | Admitting: Emergency Medicine

## 2011-10-21 DIAGNOSIS — I10 Essential (primary) hypertension: Secondary | ICD-10-CM | POA: Insufficient documentation

## 2011-10-21 DIAGNOSIS — F3289 Other specified depressive episodes: Secondary | ICD-10-CM | POA: Insufficient documentation

## 2011-10-21 DIAGNOSIS — R079 Chest pain, unspecified: Secondary | ICD-10-CM | POA: Insufficient documentation

## 2011-10-21 DIAGNOSIS — R0789 Other chest pain: Secondary | ICD-10-CM

## 2011-10-21 DIAGNOSIS — F329 Major depressive disorder, single episode, unspecified: Secondary | ICD-10-CM | POA: Insufficient documentation

## 2011-10-21 LAB — CBC
HCT: 37 % (ref 36.0–46.0)
MCH: 29.5 pg (ref 26.0–34.0)
MCHC: 33.5 g/dL (ref 30.0–36.0)
MCV: 87.9 fL (ref 78.0–100.0)
RDW: 13.8 % (ref 11.5–15.5)
WBC: 8.3 10*3/uL (ref 4.0–10.5)

## 2011-10-21 LAB — HEPATIC FUNCTION PANEL
ALT: 15 U/L (ref 0–35)
AST: 16 U/L (ref 0–37)
Alkaline Phosphatase: 121 U/L — ABNORMAL HIGH (ref 39–117)
Bilirubin, Direct: <0.1 mg/dL (ref 0.0–0.3)

## 2011-10-21 LAB — BASIC METABOLIC PANEL
BUN: 10 mg/dL (ref 6–23)
Calcium: 9.5 mg/dL (ref 8.4–10.5)
Chloride: 103 mEq/L (ref 96–112)
Creatinine, Ser: 0.6 mg/dL (ref 0.50–1.10)
GFR calc Af Amer: >90 mL/min (ref >90)

## 2011-10-21 LAB — POCT I-STAT TROPONIN I

## 2011-10-21 MED ORDER — NAPROXEN 375 MG PO TABS: 375.0000 mg | ORAL_TABLET | Freq: Two times a day (BID) | ORAL | Status: DC

## 2011-10-21 MED ORDER — PROMETHAZINE HCL 25 MG/ML IJ SOLN
12.5000 mg | Freq: Once | INTRAMUSCULAR | Status: AC
  Administered 2011-10-21: 12.5 mg via INTRAVENOUS
  Filled 2011-10-21: qty 1

## 2011-10-21 MED ORDER — LORAZEPAM 2 MG/ML IJ SOLN
1.0000 mg | Freq: Once | INTRAMUSCULAR | Status: AC
  Administered 2011-10-21: 1 mg via INTRAVENOUS
  Filled 2011-10-21: qty 1

## 2011-10-21 MED ORDER — ONDANSETRON HCL 4 MG/2ML IJ SOLN
4.0000 mg | Freq: Once | INTRAMUSCULAR | Status: DC
  Filled 2011-10-21: qty 2

## 2011-10-21 MED ORDER — LORAZEPAM 1 MG PO TABS: 1.0000 mg | ORAL_TABLET | Freq: Two times a day (BID) | ORAL | Status: AC | PRN

## 2011-10-21 MED ORDER — KETOROLAC TROMETHAMINE 30 MG/ML IJ SOLN
30.0000 mg | Freq: Once | INTRAMUSCULAR | Status: AC
  Administered 2011-10-21: 30 mg via INTRAVENOUS
  Filled 2011-10-21: qty 1

## 2011-10-21 MED ORDER — MORPHINE SULFATE 4 MG/ML IJ SOLN
4.0000 mg | Freq: Once | INTRAMUSCULAR | Status: AC
  Administered 2011-10-21: 4 mg via INTRAVENOUS
  Filled 2011-10-21: qty 1

## 2011-10-21 MED ORDER — NITROGLYCERIN 0.4 MG SL SUBL: 0.4000 mg | SUBLINGUAL_TABLET | SUBLINGUAL | Status: DC | PRN

## 2011-10-21 NOTE — ED Provider Notes (Signed)
History     CSN: [id number]  Arrival date & time 10/21/11  0116   First MD Initiated Contact with Patient 10/21/11 0127      Chief Complaint  Patient presents with  . Chest Pain    (Consider location/radiation/quality/duration/timing/severity/associated sxs/prior treatment) HPI 46 yo female presents to the ER c/o chest pain.  She and family report onset of pain around 1130 tonight while having fight and getting upset.  No prior h/o chest pain, no h/o pe or dvt in self or family.  She reports h/o cad in father, grandfathers after 88s.  Pt with h/o HTN, anxiety, back surgery.  No recent immobilization, leg swelling, pain.  No sob.  Pain is sharp, also heavy.    Past Medical History  Diagnosis Date  . Depression   . Hypertension     Past Surgical History  Procedure Date  . Appendectomy   . Cholecystectomy   . Tonsillectomy   . Abdominal hysterectomy   . Lumbar     ruptured disc in lumbar taken out  . Back surgery   . Appendectomy   . Cholecystectomy     History reviewed. No pertinent family history.  History  Substance Use Topics  . Smoking status: Never Smoker   . Smokeless tobacco: Not on file  . Alcohol Use: No    OB History    Grav Para Term Preterm Abortions TAB SAB Ect Mult Living                  Review of Systems  All other systems reviewed and are negative.    Allergies  Penicillins  Home Medications   Current Outpatient Rx  Name Route Sig Dispense Refill  . HYDROCHLOROTHIAZIDE 25 MG PO TABS Oral Take 25 mg by mouth every morning.      . NEBIVOLOL HCL 5 MG PO TABS Oral Take 5 mg by mouth daily.      BP 132/69  Resp 20  SpO2 100%  Physical Exam  Nursing note and vitals reviewed. Constitutional: She is oriented to person, place, and time. She appears well-developed and well-nourished. She appears distressed (uncomfortable appearing).  HENT:  Head: Normocephalic and atraumatic.  Nose: Nose normal.  Mouth/Throat: Oropharynx is clear and  moist.  Eyes: Conjunctivae and EOM are normal. Pupils are equal, round, and reactive to light.  Neck: Normal range of motion. Neck supple. No JVD present. No tracheal deviation present. No thyromegaly present.  Cardiovascular: Normal rate, regular rhythm, normal heart sounds and intact distal pulses.  Exam reveals no gallop and no friction rub.   No murmur heard. Pulmonary/Chest: Effort normal and breath sounds normal. No stridor. No respiratory distress. She has no wheezes. She has no rales. She exhibits tenderness (left chest tenderness with palpation, reproduces pain).  Abdominal: Soft. Bowel sounds are normal. She exhibits no distension and no mass. There is no tenderness. There is no rebound and no guarding.  Musculoskeletal: Normal range of motion. She exhibits no edema and no tenderness.  Lymphadenopathy:    She has no cervical adenopathy.  Neurological: She is alert and oriented to person, place, and time. She has normal reflexes. No cranial nerve deficit. She exhibits normal muscle tone. Coordination normal.  Skin: Skin is warm and dry. No rash noted. No erythema. No pallor.  Psychiatric: She has a normal mood and affect. Her behavior is normal. Judgment and thought content normal.    ED Course  Procedures (including critical care time)  Labs Reviewed  BASIC  METABOLIC PANEL - Abnormal; Notable for the following:    Glucose, Bld 107 (*)     All other components within normal limits  CBC  TROPONIN I  D-DIMER, QUANTITATIVE  POCT I-STAT TROPONIN I  HEPATIC FUNCTION PANEL   Dg Chest 2 View  10/21/2011  *RADIOLOGY REPORT*  Clinical Data: Chest pain, nausea, shortness of breath, and dizziness.  CHEST - 2 VIEW  Comparison: 02/22/2011  Findings: And pulmonary vascularity are normal for technique.  No focal airspace consolidation in the lungs.  No blunting of costophrenic angles.  No.  Mediastinal contours appear intact.  No significant change since previous study.  Surgical clips in the  right upper quadrant.  IMPRESSION: No evidence of active pulmonary disease.  Original Report Authenticated By: Marlon Pel, M.D.    Date: 10/21/2011  Rate: 82  Rhythm: normal sinus rhythm  QRS Axis: normal  Intervals: normal  ST/T Wave abnormalities: normal  Conduction Disutrbances:none  Narrative Interpretation:   Old EKG Reviewed: unchanged    1. Atypical chest pain       MDM  46 year old female who presents to emergency department with left-sided chest pain that is reproducible with palpation of her chest wall. Her d-dimer and initial troponin are negative. Her EKG does not show any ST elevations or depressions. Chest x-ray is negative. Will treat nausea, anxiety, pain.  4:04 AM Pt feeling better, some dull pain in left breast.  She is requesting some anxiety medication to get her through until she sees her pcm on Monday.        Olivia Mackie, MD 10/21/11 [phone]

## 2011-10-21 NOTE — ED Notes (Signed)
C/o chest pain that began after 11 pm tonight associated with nausea and SOB, dizziness. Pain is located in center of chest and radiates down left arm and into the left shoulder described as dull ache but started out sharp. Given one nitro and sharpness subsided, given 324mg  asa and 4 mg of zofran by EMS.

## 2012-08-19 ENCOUNTER — Encounter (HOSPITAL_COMMUNITY): Payer: Self-pay | Admitting: *Deleted

## 2012-08-19 ENCOUNTER — Emergency Department (HOSPITAL_COMMUNITY)
Admission: EM | Admit: 2012-08-19 | Discharge: 2012-08-20 | Disposition: A | Discharge status: Home / Self Care | Payer: Self-pay | Attending: Emergency Medicine | Admitting: Emergency Medicine

## 2012-08-19 ENCOUNTER — Emergency Department (HOSPITAL_COMMUNITY): Payer: Self-pay

## 2012-08-19 DIAGNOSIS — R079 Chest pain, unspecified: Secondary | ICD-10-CM | POA: Insufficient documentation

## 2012-08-19 DIAGNOSIS — R112 Nausea with vomiting, unspecified: Secondary | ICD-10-CM | POA: Insufficient documentation

## 2012-08-19 DIAGNOSIS — I10 Essential (primary) hypertension: Secondary | ICD-10-CM | POA: Insufficient documentation

## 2012-08-19 DIAGNOSIS — Z8659 Personal history of other mental and behavioral disorders: Secondary | ICD-10-CM | POA: Insufficient documentation

## 2012-08-19 DIAGNOSIS — Z880 Allergy status to penicillin: Secondary | ICD-10-CM | POA: Insufficient documentation

## 2012-08-19 DIAGNOSIS — Z79899 Other long term (current) drug therapy: Secondary | ICD-10-CM | POA: Insufficient documentation

## 2012-08-19 DIAGNOSIS — R51 Headache: Secondary | ICD-10-CM | POA: Insufficient documentation

## 2012-08-19 LAB — CBC WITH DIFFERENTIAL/PLATELET
Basophils Absolute: 0 10*3/uL (ref 0.0–0.1)
Basophils Relative: 0 % (ref 0–1)
Eosinophils Absolute: 0.1 10*3/uL (ref 0.0–0.7)
Eosinophils Relative: 1 % (ref 0–5)
HCT: 40.3 % (ref 36.0–46.0)
MCHC: 33.3 g/dL (ref 30.0–36.0)
MCV: 88 fL (ref 78.0–100.0)
Monocytes Absolute: 0.4 10*3/uL (ref 0.1–1.0)
RDW: 13.3 % (ref 11.5–15.5)

## 2012-08-19 LAB — BASIC METABOLIC PANEL
BUN: 10 mg/dL (ref 6–23)
CO2: 28 mEq/L (ref 19–32)
Chloride: 106 mEq/L (ref 96–112)
Creatinine, Ser: 0.59 mg/dL (ref 0.50–1.10)

## 2012-08-19 MED ORDER — PROMETHAZINE HCL 25 MG/ML IJ SOLN
INTRAMUSCULAR | Status: AC
  Filled 2012-08-19: qty 1

## 2012-08-19 MED ORDER — HYDROMORPHONE HCL PF 1 MG/ML IJ SOLN
1.0000 mg | Freq: Once | INTRAMUSCULAR | Status: AC
  Administered 2012-08-19: 1 mg via INTRAVENOUS
  Filled 2012-08-19: qty 1

## 2012-08-19 MED ORDER — SODIUM CHLORIDE 0.9 % IV SOLN
INTRAVENOUS | Status: DC
  Administered 2012-08-19: 23:00:00 via INTRAVENOUS

## 2012-08-19 MED ORDER — ASPIRIN 81 MG PO CHEW
324.0000 mg | CHEWABLE_TABLET | Freq: Once | ORAL | Status: AC
  Administered 2012-08-19: 324 mg via ORAL
  Filled 2012-08-19: qty 4

## 2012-08-19 MED ORDER — PROMETHAZINE HCL 25 MG/ML IJ SOLN
12.5000 mg | Freq: Once | INTRAMUSCULAR | Status: AC
  Administered 2012-08-19: 22:00:00 via INTRAVENOUS

## 2012-08-19 MED ORDER — SODIUM CHLORIDE 0.9 % IV BOLUS (SEPSIS)
1000.0000 mL | Freq: Once | INTRAVENOUS | Status: AC
  Administered 2012-08-19: 1000 mL via INTRAVENOUS

## 2012-08-19 MED ORDER — ONDANSETRON HCL 4 MG/2ML IJ SOLN
4.0000 mg | Freq: Once | INTRAMUSCULAR | Status: DC
  Filled 2012-08-19: qty 2

## 2012-08-19 MED ORDER — ONDANSETRON HCL 4 MG/2ML IJ SOLN
4.0000 mg | Freq: Once | INTRAMUSCULAR | Status: AC
  Administered 2012-08-19: 4 mg via INTRAVENOUS
  Filled 2012-08-19: qty 2

## 2012-08-19 NOTE — ED Notes (Signed)
Pt states started feeling dizzy/nausea and vomiting began this afternoon and shortly after had chest pains in mid chest. Denies radiation of pain, diaphoresis and pressure in chest

## 2012-08-19 NOTE — ED Notes (Addendum)
Pt reporting nausea and vomiting beginning today, headache, dizziness and chest pain this afternoon.  Reported pain in left side of chest and some tingling into left arm.

## 2012-08-19 NOTE — ED Provider Notes (Addendum)
History     CSN: [id number]  Arrival date & time 08/19/12  1901   First MD Initiated Contact with Patient 08/19/12 1929      No chief complaint on file.   (Consider location/radiation/quality/duration/timing/severity/associated sxs/prior treatment) The history is provided by the patient.   47 year old female about mid afternoon felt nauseated at about the 5:30 developed the chest pain left-sided intermittent lasting 10-15 minutes at a time. Pain radiates to the left arm. No bodyaches no fever [facility] [place]. Has vomited 4-5 times. No blood in it no diarrhea. Chest pain is described as 10 out of 10 sharp in nature.  Past Medical History  Diagnosis Date  . Depression   . Hypertension     Past Surgical History  Procedure Laterality Date  . Appendectomy    . Cholecystectomy    . Tonsillectomy    . Abdominal hysterectomy    . Lumbar      ruptured disc in lumbar taken out  . Back surgery    . Appendectomy    . Cholecystectomy      History reviewed. No pertinent family history.  History  Substance Use Topics  . Smoking status: Never Smoker   . Smokeless tobacco: Not on file  . Alcohol Use: No    OB History   Grav Para Term Preterm Abortions TAB SAB Ect Mult Living                  Review of Systems  Constitutional: Negative for fever.  HENT: Negative for congestion.   Eyes: Negative for visual disturbance.  Respiratory: Negative for shortness of breath.   Cardiovascular: Positive for chest pain.  Gastrointestinal: Positive for nausea and vomiting. Negative for abdominal pain and diarrhea.  Genitourinary: Negative for dysuria.  Musculoskeletal: Negative for myalgias and back pain.  Skin: Negative for rash.  Neurological: Positive for headaches.  Hematological: Does not bruise/bleed easily.  Psychiatric/Behavioral: Negative for confusion.    Allergies  Penicillins  Home Medications   Current Outpatient Rx  Name  Route  Sig  Dispense   Refill  . hydrochlorothiazide (HYDRODIURIL) 25 MG tablet   Oral   Take 25 mg by mouth every morning.            BP 134/87  Pulse 92  Temp(Src) 98.1 F (36.7 C) (Oral)  Resp 20  Ht 5\' 7"  (1.702 m)  Wt 190 lb (86.183 kg)  BMI 29.75 kg/m2  SpO2 100%  Physical Exam  Nursing note and vitals reviewed. Constitutional: She is oriented to person, place, and time. She appears well-developed and well-nourished. No distress.  HENT:  Head: Normocephalic and atraumatic.  Mouth/Throat: Oropharynx is clear and moist.  Eyes: Conjunctivae are normal. Pupils are equal, round, and reactive to light.  Neck: Neck supple.  Cardiovascular: Normal rate, regular rhythm and normal heart sounds.   Pulmonary/Chest: Effort normal and breath sounds normal.  Abdominal: Soft. Bowel sounds are normal. There is no tenderness.  Musculoskeletal: Normal range of motion. She exhibits no edema.  Neurological: She is alert and oriented to person, place, and time. No cranial nerve deficit. She exhibits normal muscle tone. Coordination normal.  Skin: Skin is warm. No rash noted.    ED Course  Procedures (including critical care time)  Labs Reviewed  CBC WITH DIFFERENTIAL - Abnormal; Notable for the following:    Neutrophils Relative % 85 (*)    Lymphocytes Relative 9 (*)    All other components within normal limits  BASIC METABOLIC PANEL  TROPONIN I  TROPONIN I   Dg Abd Acute W/chest  08/19/2012   *RADIOLOGY REPORT*  Clinical Data: Nausea, vomiting and chest pain.  ACUTE ABDOMEN SERIES (ABDOMEN 2 VIEW & CHEST 1 VIEW)  Comparison: Chest x-ray 10/21/2011.  Findings: Lung volumes are normal.  No consolidative airspace disease.  No pleural effusions.  No pneumothorax.  No pulmonary nodule or mass noted.  Pulmonary vasculature and the cardiomediastinal silhouette are within normal limits.  Surgical clips project over the right upper quadrant of the abdomen, likely from prior cholecystectomy. Gas and stool are seen  scattered throughout the colon extending to the level of the distal rectum.  No pathologic distension of small bowel is noted.  No gross evidence of pneumoperitoneum.  Orthopedic fixation hardware in the lower lumbar spine.  IMPRESSION: 1.  Nonobstructive bowel gas pattern. 2.  No pneumoperitoneum. 3.  No radiographic evidence of acute cardiopulmonary disease. 4.  Postoperative changes, as above.   Original Report Authenticated By: Trudie Reed, M.D.     Date: 08/19/2012  Rate: 96  Rhythm: normal sinus rhythm and premature ventricular contractions (PVC)  QRS Axis: normal  Intervals: normal  ST/T Wave abnormalities: normal  Conduction Disutrbances:none  Narrative Interpretation:   Old EKG Reviewed: unchanged No snignificant change in EKG compared to 10/21/2011  Results for orders placed during the hospital encounter of 08/19/12  BASIC METABOLIC PANEL      Result Value Range   Sodium 143  135 - 145 mEq/L   Potassium 3.8  3.5 - 5.1 mEq/L   Chloride 106  96 - 112 mEq/L   CO2 28  19 - 32 mEq/L   Glucose, Bld 95  70 - 99 mg/dL   BUN 10  6 - 23 mg/dL   Creatinine, Ser 1.61  0.50 - 1.10 mg/dL   Calcium 8.9  8.4 - 09.6 mg/dL   GFR calc non Af Amer >90  >90 mL/min   GFR calc Af Amer >90  >90 mL/min  CBC WITH DIFFERENTIAL      Result Value Range   WBC 8.5  4.0 - 10.5 K/uL   RBC 4.58  3.87 - 5.11 MIL/uL   Hemoglobin 13.4  12.0 - 15.0 g/dL   HCT 04.5  40.9 - 81.1 %   MCV 88.0  78.0 - 100.0 fL   MCH 29.3  26.0 - 34.0 pg   MCHC 33.3  30.0 - 36.0 g/dL   RDW 91.4  78.2 - 95.6 %   Platelets 226  150 - 400 K/uL   Neutrophils Relative % 85 (*) 43 - 77 %   Neutro Abs 7.3  1.7 - 7.7 K/uL   Lymphocytes Relative 9 (*) 12 - 46 %   Lymphs Abs 0.8  0.7 - 4.0 K/uL   Monocytes Relative 5  3 - 12 %   Monocytes Absolute 0.4  0.1 - 1.0 K/uL   Eosinophils Relative 1  0 - 5 %   Eosinophils Absolute 0.1  0.0 - 0.7 K/uL   Basophils Relative 0  0 - 1 %   Basophils Absolute 0.0  0.0 - 0.1 K/uL  TROPONIN  I      Result Value Range   Troponin I <0.30  <0.30 ng/mL   Results for orders placed during the hospital encounter of 08/19/12  BASIC METABOLIC PANEL      Result Value Range   Sodium 143  135 - 145 mEq/L   Potassium 3.8  3.5 - 5.1 mEq/L  Chloride 106  96 - 112 mEq/L   CO2 28  19 - 32 mEq/L   Glucose, Bld 95  70 - 99 mg/dL   BUN 10  6 - 23 mg/dL   Creatinine, Ser 1.61  0.50 - 1.10 mg/dL   Calcium 8.9  8.4 - 09.6 mg/dL   GFR calc non Af Amer >90  >90 mL/min   GFR calc Af Amer >90  >90 mL/min  CBC WITH DIFFERENTIAL      Result Value Range   WBC 8.5  4.0 - 10.5 K/uL   RBC 4.58  3.87 - 5.11 MIL/uL   Hemoglobin 13.4  12.0 - 15.0 g/dL   HCT 04.5  40.9 - 81.1 %   MCV 88.0  78.0 - 100.0 fL   MCH 29.3  26.0 - 34.0 pg   MCHC 33.3  30.0 - 36.0 g/dL   RDW 91.4  78.2 - 95.6 %   Platelets 226  150 - 400 K/uL   Neutrophils Relative % 85 (*) 43 - 77 %   Neutro Abs 7.3  1.7 - 7.7 K/uL   Lymphocytes Relative 9 (*) 12 - 46 %   Lymphs Abs 0.8  0.7 - 4.0 K/uL   Monocytes Relative 5  3 - 12 %   Monocytes Absolute 0.4  0.1 - 1.0 K/uL   Eosinophils Relative 1  0 - 5 %   Eosinophils Absolute 0.1  0.0 - 0.7 K/uL   Basophils Relative 0  0 - 1 %   Basophils Absolute 0.0  0.0 - 0.1 K/uL  TROPONIN I      Result Value Range   Troponin I <0.30  <0.30 ng/mL  TROPONIN I      Result Value Range   Troponin I <0.30  <0.30 ng/mL     1. Chest pain   2. Nausea & vomiting       MDM  Clinically suspect that patient's symptoms are GI related since nausea was present along before the chest pain. Initial troponin was negative EKG without acute changes. Repeat 2 hour troponin will be done to ensure that there is no change. Plain abdominal films and chest x-ray were negative. No leukocytosis. No electrolyte abnormalities no renal insufficiency. Patient nausea is not improved we'll treat with Phenergan and a dose of pain medicine. For the left-sided chest pain. If repeat troponin is negative this is not  likely to be cardiac in nature we'll continue to treat with pain medicine and antinausea medicine.  The patient's repeat troponin was negative as stated above feel that symptoms are not related to cardiac chest pain. 2 troponins being negative this is highly unlikely. Patient will be cleared for discharge.      Shelda Jakes, MD 08/19/12 2231  Shelda Jakes, MD 08/20/12 2012

## 2012-08-20 MED ORDER — PROMETHAZINE HCL 25 MG PO TABS: 12.5000 mg | ORAL_TABLET | Freq: Four times a day (QID) | ORAL | Status: DC | PRN

## 2012-08-20 NOTE — ED Provider Notes (Signed)
2345 Assumed care/disposition of patient. Here with chest pain, most likely not cardiac. She has had one negative troponin,awaiting the next troponin. 1209 Troponin is negative. Reviewed result with patient. Discharged patient home.   Results for orders placed during the hospital encounter of 08/19/12  BASIC METABOLIC PANEL      Result Value Range   Sodium 143  135 - 145 mEq/L   Potassium 3.8  3.5 - 5.1 mEq/L   Chloride 106  96 - 112 mEq/L   CO2 28  19 - 32 mEq/L   Glucose, Bld 95  70 - 99 mg/dL   BUN 10  6 - 23 mg/dL   Creatinine, Ser 6.04  0.50 - 1.10 mg/dL   Calcium 8.9  8.4 - 54.0 mg/dL   GFR calc non Af Amer >90  >90 mL/min   GFR calc Af Amer >90  >90 mL/min  CBC WITH DIFFERENTIAL      Result Value Range   WBC 8.5  4.0 - 10.5 K/uL   RBC 4.58  3.87 - 5.11 MIL/uL   Hemoglobin 13.4  12.0 - 15.0 g/dL   HCT 98.1  19.1 - 47.8 %   MCV 88.0  78.0 - 100.0 fL   MCH 29.3  26.0 - 34.0 pg   MCHC 33.3  30.0 - 36.0 g/dL   RDW 29.5  62.1 - 30.8 %   Platelets 226  150 - 400 K/uL   Neutrophils Relative % 85 (*) 43 - 77 %   Neutro Abs 7.3  1.7 - 7.7 K/uL   Lymphocytes Relative 9 (*) 12 - 46 %   Lymphs Abs 0.8  0.7 - 4.0 K/uL   Monocytes Relative 5  3 - 12 %   Monocytes Absolute 0.4  0.1 - 1.0 K/uL   Eosinophils Relative 1  0 - 5 %   Eosinophils Absolute 0.1  0.0 - 0.7 K/uL   Basophils Relative 0  0 - 1 %   Basophils Absolute 0.0  0.0 - 0.1 K/uL  TROPONIN I      Result Value Range   Troponin I <0.30  <0.30 ng/mL  TROPONIN I      Result Value Range   Troponin I <0.30  <0.30 ng/mL    Nicoletta Dress. Colon Branch, MD 08/20/12 0010

## 2012-12-17 ENCOUNTER — Telehealth: Payer: Self-pay | Admitting: Family Medicine

## 2012-12-17 DIAGNOSIS — Z1211 Encounter for screening for malignant neoplasm of colon: Secondary | ICD-10-CM

## 2012-12-17 NOTE — Telephone Encounter (Signed)
Needs Mammogram . [facility].

## 2013-01-03 ENCOUNTER — Ambulatory Visit (INDEPENDENT_AMBULATORY_CARE_PROVIDER_SITE_OTHER): Payer: PRIVATE HEALTH INSURANCE | Admitting: Family Medicine

## 2013-01-03 ENCOUNTER — Encounter: Payer: Self-pay | Admitting: Family Medicine

## 2013-01-03 VITALS — BP 130/80 | HR 80 | Temp 97.9°F | Resp 16 | Ht 66.5 in | Wt 198.0 lb

## 2013-01-03 DIAGNOSIS — L719 Rosacea, unspecified: Secondary | ICD-10-CM | POA: Insufficient documentation

## 2013-01-03 DIAGNOSIS — Z0000 Encounter for general adult medical examination without abnormal findings: Secondary | ICD-10-CM

## 2013-01-03 LAB — COMPLETE METABOLIC PANEL WITH GFR
Albumin: 4.1 g/dL (ref 3.5–5.2)
Alkaline Phosphatase: 135 U/L — ABNORMAL HIGH (ref 39–117)
BUN: 14 mg/dL (ref 6–23)
CO2: 26 mEq/L (ref 19–32)
GFR, Est African American: >89 mL/min
GFR, Est Non African American: >89 mL/min
Glucose, Bld: 93 mg/dL (ref 70–99)
Potassium: 4.5 mEq/L (ref 3.5–5.3)
Sodium: 142 mEq/L (ref 135–145)
Total Protein: 6.9 g/dL (ref 6.0–8.3)

## 2013-01-03 LAB — LIPID PANEL
Cholesterol: 190 mg/dL (ref 0–200)
HDL: 65 mg/dL (ref >39)
LDL Cholesterol: 109 mg/dL — ABNORMAL HIGH (ref 0–99)
Triglycerides: 81 mg/dL (ref <150)

## 2013-01-03 LAB — CBC WITH DIFFERENTIAL/PLATELET
Basophils Absolute: 0 10*3/uL (ref 0.0–0.1)
Basophils Relative: 1 % (ref 0–1)
Eosinophils Absolute: 0.1 10*3/uL (ref 0.0–0.7)
Eosinophils Relative: 2 % (ref 0–5)
HCT: 37.2 % (ref 36.0–46.0)
Lymphocytes Relative: 34 % (ref 12–46)
Lymphs Abs: 2.1 10*3/uL (ref 0.7–4.0)
MCHC: 34.9 g/dL (ref 30.0–36.0)
Neutro Abs: 3.5 10*3/uL (ref 1.7–7.7)
Neutrophils Relative %: 57 % (ref 43–77)
Platelets: 255 10*3/uL (ref 150–400)
RDW: 13.6 % (ref 11.5–15.5)
WBC: 6 10*3/uL (ref 4.0–10.5)

## 2013-01-03 NOTE — Progress Notes (Signed)
Subjective:    Patient ID: [name], female    DOB: 10/22/1965, 47 y.o.   MRN: [id number]  HPI  Patient is a very pleasant 47 year old white female comes in today for complete physical exam. She has a history of hysterectomy for noncancerous reasons and therefore does not require a Pap smear. She is overdue for her mammogram. She does report breast tenderness in the upper part of her chest. She has no angina or shortness of breath. She also has been having a prom with daily anxiety. She has occasional mild panic attacks but not on a daily basis. She denies any problems with depression. She does have trouble sleeping at night due to the anxiety and stress. Distress mainly stems from such situations and her family. Her son is going through divorce. He is living with her.  She also has a problem rosacea. She is also interested in medication help with weight loss. Past Medical History  Diagnosis Date  . Depression   . Hypertension   . Anxiety   . Rosacea    Past Surgical History  Procedure Laterality Date  . Appendectomy    . Cholecystectomy    . Tonsillectomy    . Lumbar      ruptured disc in lumbar taken out  . Back surgery    . Appendectomy    . Cholecystectomy    . Abdominal hysterectomy      non cancerous   No current outpatient prescriptions on file prior to visit.   No current facility-administered medications on file prior to visit.   Allergies  Allergen Reactions  . Penicillins Hives   History   Social History  . Marital Status: Married    Spouse Name: N/A    Number of Children: N/A  . Years of Education: N/A   Occupational History  . Not on file.   Social History Main Topics  . Smoking status: Never Smoker   . Smokeless tobacco: Never Used  . Alcohol Use: No  . Drug Use: No  . Sexual Activity: Not on file   Other Topics Concern  . Not on file   Social History Narrative  . No narrative on file   Family History  Problem Relation Age of Onset  .  Diabetes Father   . Heart disease Father 57  . Hypertension Father   . Cancer Maternal Grandfather     colon cancer (70's)  . Diabetes Paternal Grandmother   . Diabetes Paternal Grandfather   . Cancer Cousin     breast     Review of Systems  All other systems reviewed and are negative.       Objective:   Physical Exam  Vitals reviewed. Constitutional: She is oriented to person, place, and time. She appears well-developed and well-nourished. No distress.  HENT:  Head: Normocephalic and atraumatic.  Right Ear: External ear normal.  Left Ear: External ear normal.  Nose: Nose normal.  Mouth/Throat: Oropharynx is clear and moist. No oropharyngeal exudate.  Eyes: Conjunctivae and EOM are normal. Pupils are equal, round, and reactive to light. Right eye exhibits no discharge. Left eye exhibits no discharge. No scleral icterus.  Neck: Normal range of motion. Neck supple. No JVD present. No thyromegaly present.  Cardiovascular: Normal rate, regular rhythm, normal heart sounds and intact distal pulses.  Exam reveals no gallop and no friction rub.   No murmur heard. Pulmonary/Chest: Effort normal and breath sounds normal. No stridor. No respiratory distress. She has no wheezes. She  has no rales. She exhibits no tenderness.  Abdominal: Soft. Bowel sounds are normal. She exhibits no distension and no mass. There is no tenderness. There is no rebound and no guarding.  Musculoskeletal: Normal range of motion. She exhibits no edema and no tenderness.  Lymphadenopathy:    She has no cervical adenopathy.  Neurological: She is alert and oriented to person, place, and time. She has normal reflexes. She displays normal reflexes. No cranial nerve deficit. She exhibits normal muscle tone. Coordination normal.  Skin: Skin is warm. Rash noted. She is not diaphoretic. No pallor.  Psychiatric: She has a normal mood and affect. Her behavior is normal. Judgment and thought content normal.           Assessment & Plan:  1. Routine general medical examination at a health care facility I will schedule the patient for a mammogram. I will check a CBC a CMP a fasting lipid panel and a TSH. The patient is her flu shot at work. She is due for a tetanus shot but she declines this at this time. She is not due for colonoscopy until age 46. Regarding her daily stress recommend an SSRI. I will await results of lab work first. I like to change her stress prior to starting medicine for rosacea the rosacea can improve his weekly one of the triggers. If not would consider doxycycline. Regarding her weight loss we could try either Adipex orlistat. Concern Adipex may make anxiety worse. I would like to get the results of the blood work back first. Called patient with results of blood work and await her decision on whether to start medicine for anxiety weight loss. - COMPLETE METABOLIC PANEL WITH GFR - CBC with Differential - Lipid panel - TSH

## 2013-01-06 ENCOUNTER — Encounter: Payer: Self-pay | Admitting: Family Medicine

## 2013-01-06 ENCOUNTER — Other Ambulatory Visit: Payer: Self-pay | Admitting: Family Medicine

## 2013-01-06 MED ORDER — ESCITALOPRAM OXALATE 10 MG PO TABS: 10.0000 mg | ORAL_TABLET | Freq: Every day | ORAL | Status: DC

## 2013-01-06 NOTE — Telephone Encounter (Signed)
Med sent to pharm 

## 2013-01-06 NOTE — Telephone Encounter (Signed)
Wants to know lab results

## 2013-01-07 ENCOUNTER — Encounter: Payer: Self-pay | Admitting: Family Medicine

## 2013-01-07 NOTE — Telephone Encounter (Signed)
This encounter was created in error - please disregard.

## 2013-01-07 NOTE — Telephone Encounter (Signed)
Patient would like to know if her mammogram has been scheduled?

## 2013-01-08 ENCOUNTER — Other Ambulatory Visit: Payer: Self-pay | Admitting: Family Medicine

## 2013-01-08 DIAGNOSIS — Z1231 Encounter for screening mammogram for malignant neoplasm of breast: Secondary | ICD-10-CM

## 2013-01-10 NOTE — Telephone Encounter (Signed)
This has been ordered 

## 2013-01-10 NOTE — Telephone Encounter (Signed)
This encounter was created in error - please disregard.

## 2013-01-31 ENCOUNTER — Ambulatory Visit (INDEPENDENT_AMBULATORY_CARE_PROVIDER_SITE_OTHER): Payer: PRIVATE HEALTH INSURANCE | Admitting: Family Medicine

## 2013-01-31 ENCOUNTER — Encounter: Payer: Self-pay | Admitting: Family Medicine

## 2013-01-31 VITALS — BP 120/82 | HR 98 | Temp 98.3°F | Resp 20 | Ht 65.5 in | Wt 202.0 lb

## 2013-01-31 DIAGNOSIS — J029 Acute pharyngitis, unspecified: Secondary | ICD-10-CM

## 2013-01-31 DIAGNOSIS — H6690 Otitis media, unspecified, unspecified ear: Secondary | ICD-10-CM | POA: Insufficient documentation

## 2013-01-31 DIAGNOSIS — J069 Acute upper respiratory infection, unspecified: Secondary | ICD-10-CM

## 2013-01-31 LAB — RAPID STREP SCREEN (MED CTR MEBANE ONLY): Streptococcus, Group A Screen (Direct): NEGATIVE

## 2013-01-31 MED ORDER — AZITHROMYCIN 250 MG PO TABS: ORAL_TABLET | ORAL | Status: DC

## 2013-01-31 MED ORDER — GUAIFENESIN-CODEINE 100-10 MG/5ML PO SOLN: 10.0000 mL | Freq: Four times a day (QID) | ORAL | Status: DC | PRN

## 2013-01-31 NOTE — Assessment & Plan Note (Signed)
Antibiotics per above

## 2013-01-31 NOTE — Progress Notes (Signed)
   Subjective:    Patient ID: [name], female    DOB: 11-13-1965, 47 y.o.   MRN: [id number]  HPI Patient here with sore throat,  cough with congestion and production for the past week. Positive sick contact with her grandson who is currently on antibiotics for bronchitis. 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 She's tried over-the-counter medications as well as ibuprofen with no improvement. She's not had any recent fever. She also complains of bilateral ear pain   Review of Systems  - per above  GEN- denies fatigue, fever, weight loss,weakness, recent illness HEENT- denies eye drainage, change in vision, +nasal discharge, CVS- denies chest pain, palpitations RESP- denies SOB, +cough, wheeze Neuro- denies headache, dizziness, syncope, seizure activity      Objective:   Physical Exam  GEN- NAD, alert and oriented x3 HEENT- PERRL, EOMI, non injected sclera, pink conjunctiva, MMM, oropharynx mild injection, TM injected right side, clear effusion, left TM clear  + maxillary sinus tenderness, +Nasal drainage  Neck- Supple, no LAD CVS- RRR, no murmur RESP-CTAB EXT- No edema Pulses- Radial 2+         Assessment & Plan:

## 2013-01-31 NOTE — Patient Instructions (Signed)
Right ear infection/URI  Cough medication given F/U if not improved

## 2013-01-31 NOTE — Assessment & Plan Note (Signed)
Treat basal worsening symptoms. Also has right ear infection. We'll give her azithromycin and she is allergy to penicillin. Cough medication also given

## 2013-02-12 ENCOUNTER — Ambulatory Visit
Admission: RE | Admit: 2013-02-12 | Discharge: 2013-02-12 | Disposition: A | Discharge status: Home / Self Care | Payer: PRIVATE HEALTH INSURANCE | Source: Ambulatory Visit | Attending: Family Medicine | Admitting: Family Medicine

## 2013-02-12 DIAGNOSIS — Z1231 Encounter for screening mammogram for malignant neoplasm of breast: Secondary | ICD-10-CM

## 2013-03-18 ENCOUNTER — Ambulatory Visit (INDEPENDENT_AMBULATORY_CARE_PROVIDER_SITE_OTHER): Payer: PRIVATE HEALTH INSURANCE | Admitting: Physician Assistant

## 2013-03-18 ENCOUNTER — Encounter: Payer: Self-pay | Admitting: Physician Assistant

## 2013-03-18 VITALS — BP 126/88 | HR 72 | Temp 98.4°F | Resp 18 | Wt 200.0 lb

## 2013-03-18 DIAGNOSIS — J988 Other specified respiratory disorders: Secondary | ICD-10-CM

## 2013-03-18 DIAGNOSIS — R110 Nausea: Secondary | ICD-10-CM

## 2013-03-18 DIAGNOSIS — J22 Unspecified acute lower respiratory infection: Secondary | ICD-10-CM

## 2013-03-18 DIAGNOSIS — B9689 Other specified bacterial agents as the cause of diseases classified elsewhere: Secondary | ICD-10-CM

## 2013-03-18 DIAGNOSIS — R509 Fever, unspecified: Secondary | ICD-10-CM

## 2013-03-18 DIAGNOSIS — R109 Unspecified abdominal pain: Secondary | ICD-10-CM

## 2013-03-18 DIAGNOSIS — A499 Bacterial infection, unspecified: Secondary | ICD-10-CM

## 2013-03-18 LAB — INFLUENZA A AND B
INFLUENZA A AG: NEGATIVE
Influenza B Ag: NEGATIVE

## 2013-03-18 LAB — CBC W/MCH & 3 PART DIFF
HCT: 42.1 % (ref 36.0–46.0)
HEMOGLOBIN: 12.9 g/dL (ref 12.0–15.0)
Lymphocytes Relative: 31 % (ref 12–46)
Lymphs Abs: 2 10*3/uL (ref 0.7–4.0)
MCH: 28.2 pg (ref 26.0–34.0)
MCHC: 30.6 g/dL (ref 30.0–36.0)
MCV: 92.1 fL (ref 78.0–100.0)
NEUTROS PCT: 62 % (ref 43–77)
Neutro Abs: 3.9 10*3/uL (ref 1.7–7.7)
Platelets: 229 10*3/uL (ref 150–400)
RBC: 4.57 MIL/uL (ref 3.87–5.11)
RDW: 15.8 % — AB (ref 11.5–15.5)
WBC mixed population: 0.4 10*3/uL (ref 0.1–1.8)
WBC: 6.3 10*3/uL (ref 4.0–10.5)
WBCMIXPER: 7 % (ref 3–18)

## 2013-03-18 LAB — URINALYSIS, ROUTINE W REFLEX MICROSCOPIC
Bilirubin Urine: NEGATIVE
Glucose, UA: NEGATIVE mg/dL
KETONES UR: NEGATIVE mg/dL
Nitrite: NEGATIVE
PH: 6 (ref 5.0–8.0)
Protein, ur: NEGATIVE mg/dL
Specific Gravity, Urine: 1.025 (ref 1.005–1.030)
Urobilinogen, UA: 0.2 mg/dL (ref 0.0–1.0)

## 2013-03-18 LAB — URINALYSIS, MICROSCOPIC ONLY
Casts: NONE SEEN
Crystals: NONE SEEN

## 2013-03-18 MED ORDER — PROMETHAZINE HCL 25 MG PO TABS: 25.0000 mg | ORAL_TABLET | Freq: Three times a day (TID) | ORAL | Status: DC | PRN

## 2013-03-18 MED ORDER — AZITHROMYCIN 250 MG PO TABS: ORAL_TABLET | ORAL | Status: DC

## 2013-03-18 NOTE — Progress Notes (Signed)
Patient ID: [name] MRN: [id number], DOB: 1965-04-03, 48 y.o. Date of Encounter: 03/18/2013, 12:22 PM    Chief Complaint:  Chief Complaint  Patient presents with  . sick    HA, stomach pains, cough, cong x 1 day     HPI: 48 y.o. year old white female reports that yesterday she developed some abdominal crampiness, nausea, chills, headache. She's had no vomiting and no diarrhea and no fever. No dysuria. No urinary frequency or urgency. Today still feeling nauseous with some abdominal pain. Says that she has had her gallbladder and appendix removed in the past.  Also has had cough productive of green phlegm for over one week. Says that occasionally she is able to place something from her nose but this is mostly clear. No significant sore throat ear pain.     Home Meds: See attached medication section for any medications that were entered at today's visit. The computer does not put those onto this list.The following list is a list of meds entered prior to today's visit.   Current Outpatient Prescriptions on File Prior to Visit  Medication Sig Dispense Refill  . escitalopram (LEXAPRO) 10 MG tablet Take 1 tablet (10 mg total) by mouth daily.  30 tablet  3   No current facility-administered medications on file prior to visit.    Allergies:  Allergies  Allergen Reactions  . Penicillins Hives      Review of Systems: See HPI for pertinent ROS. All other ROS negative.    Physical Exam: Blood pressure 126/88, pulse 72, temperature 98.4 F (36.9 C), temperature source Oral, resp. rate 18, weight 200 lb (90.719 kg)., Body mass index is 32.76 kg/(m^2). General:  Overweight white female .Appears in no acute distress. HEENT: Normocephalic, atraumatic, eyes without discharge, sclera non-icteric, nares are without discharge. Bilateral auditory canals clear, TM's are without perforation, pearly grey and translucent with reflective cone of light bilaterally. Oral cavity moist,  posterior pharynx without exudate, erythema, peritonsillar abscess. Sinuses without tenderness with percussion.  Neck: Supple. No thyromegaly. No lymphadenopathy. Lungs: Clear bilaterally to auscultation without wheezes, rales, or rhonchi. Breathing is unlabored. Heart: Regular rhythm. No murmurs, rubs, or gallops. Abdomen: Soft, non-tender, non-distended with normoactive bowel sounds. No hepatomegaly. No rebound/guarding. No obvious abdominal masses. She reports very mild tenderness with palpation in the periumbilical area. No other areas of tenderness. No guarding. Msk:  Strength and tone normal for age. Extremities/Skin: Warm and dry. No clubbing or cyanosis. No edema. No rashes or suspicious lesions. Neuro: Alert and oriented X 3. Moves all extremities spontaneously. Gait is normal. CNII-XII grossly in tact. Psych:  Responds to questions appropriately with a normal affect.   Results for orders placed in visit on 03/18/13  INFLUENZA A AND B      Result Value Range   Source-INFBD NASAL     Inflenza A Ag NEG  Negative   Influenza B Ag NEG  Negative  URINALYSIS, ROUTINE W REFLEX MICROSCOPIC      Result Value Range   Color, Urine YELLOW  YELLOW   APPearance CLEAR  CLEAR   Specific Gravity, Urine 1.025  1.005 - 1.030   pH 6.0  5.0 - 8.0   Glucose, UA NEG  NEG mg/dL   Bilirubin Urine NEG  NEG   Ketones, ur NEG  NEG mg/dL   Hgb urine dipstick TRACE (*) NEG   Protein, ur NEG  NEG mg/dL   Urobilinogen, UA 0.2  0.0 - 1.0 mg/dL   Nitrite  NEG  NEG   Leukocytes, UA TRACE (*) NEG  CBC [facility] & 3 PART DIFF      Result Value Range   WBC 6.3  4.0 - 10.5 K/uL   RBC 4.57  3.87 - 5.11 MIL/uL   Hemoglobin 12.9  12.0 - 15.0 g/dL   HCT 42.1  36.0 - 46.0 %   MCV 92.1  78.0 - 100.0 fL   MCH 28.2  26.0 - 34.0 pg   MCHC 30.6  30.0 - 36.0 g/dL   RDW 15.8 (*) 11.5 - 15.5 %   Platelets 229  150 - 400 K/uL   Neutrophils Relative % 62  43 - 77 %   Neutro Abs 3.9  1.7 - 7.7 K/uL   Lymphocytes Relative 31   12 - 46 %   Lymphs Abs 2.0  0.7 - 4.0 K/uL   WBC mixed population % 7  3 - 18 %   WBC mixed population 0.4  0.1 - 1.8 K/uL  URINALYSIS, MICROSCOPIC ONLY      Result Value Range   Squamous Epithelial / LPF FEW  RARE   Crystals NONE SEEN  NONE SEEN   Casts NONE SEEN  NONE SEEN   WBC, UA 0-2  <3 WBC/hpf   RBC / HPF 0-2  <3 RBC/hpf   Bacteria, UA MANY (*) RARE     ASSESSMENT AND PLAN:  48 y.o. year old female with  1. Fever, unspecified - Influenza a and b - Urinalysis, Routine w reflex microscopic - CBC w/[facility] & 3 Part Diff  2. Nausea - Urinalysis, Routine w reflex microscopic - CBC w/[facility] & 3 Part Diff - promethazine (PHENERGAN) 25 MG tablet; Take 1 tablet (25 mg total) by mouth every 8 (eight) hours as needed for nausea or vomiting.  Dispense: 20 tablet; Refill: 0  3. Abdominal pain - Urinalysis, Routine w reflex microscopic - CBC w/[facility] & 3 Part Diff  4. Bacterial lower respiratory infection Pcn allergy. - azithromycin (ZITHROMAX) 250 MG tablet; Day 1: Take 2 daily.  Days 2-5: Take 1 daily.  Dispense: 6 tablet; Refill: 0  I discussed all lab results with her during office visit. That I think that the nausea and abdominal discomfort are either secondary to something she has eaten or a virus. Either way, the treatment will be the same for now. She needs to stick to a clear liquid diet then advance to bland diet as tolerated. She is requesting some Phenergan to use as needed.  Once the nausea and abdominal discomfort have resolved then she can start the antibiotic. She is not to start the antibiotic until the symptoms have resolved. Once she starts the antibiotic take as directed and complete it. If her cough and chest congestion do not resolve within one week after completing antibiotics then followup. She says that she needs [place] to fax a note to her work. The front office staff is doing this. She says she was scheduled to work today and tomorrow. Is then scheduled to be off  Thursday. Is then scheduled to go back to work Friday and work the weekend as well. For now we'll write her to be out of work today through Thursday. If she is still feeling sick enough at that time that she needs to continue to be out of work further,  she can call me Thursday.   [address] [place], [place], [facility] 03/18/2013 12:22 PM

## 2013-05-13 ENCOUNTER — Encounter: Payer: Self-pay | Admitting: Family Medicine

## 2013-05-13 ENCOUNTER — Ambulatory Visit (INDEPENDENT_AMBULATORY_CARE_PROVIDER_SITE_OTHER): Payer: PRIVATE HEALTH INSURANCE | Admitting: Family Medicine

## 2013-05-13 VITALS — BP 118/58 | HR 80 | Temp 98.0°F | Resp 16 | Ht 65.0 in | Wt 196.0 lb

## 2013-05-13 DIAGNOSIS — H109 Unspecified conjunctivitis: Secondary | ICD-10-CM

## 2013-05-13 MED ORDER — POLYMYXIN B-TRIMETHOPRIM 10000-0.1 UNIT/ML-% OP SOLN: 1.0000 [drp] | Freq: Four times a day (QID) | OPHTHALMIC | Status: DC

## 2013-05-13 NOTE — Patient Instructions (Signed)
Take eye drop as prescribed-- for 1 week   Bacterial Conjunctivitis Bacterial conjunctivitis, commonly called pink eye, is an inflammation of the clear membrane that covers the white part of the eye (conjunctiva). The inflammation can also happen on the underside of the eyelids. The blood vessels in the conjunctiva become inflamed causing the eye to become red or pink. Bacterial conjunctivitis may spread easily from one eye to another and from person to person (contagious).  CAUSES  Bacterial conjunctivitis is caused by bacteria. The bacteria may come from your own skin, your upper respiratory tract, or from someone else with bacterial conjunctivitis. SYMPTOMS  The normally white color of the eye or the underside of the eyelid is usually pink or red. The pink eye is usually associated with irritation, tearing, and some sensitivity to light. Bacterial conjunctivitis is often associated with a thick, yellowish discharge from the eye. The discharge may turn into a crust on the eyelids overnight, which causes your eyelids to stick together. If a discharge is present, there may also be some blurred vision in the affected eye. DIAGNOSIS  Bacterial conjunctivitis is diagnosed by your caregiver through an eye exam and the symptoms that you report. Your caregiver looks for changes in the surface tissues of your eyes, which may point to the specific type of conjunctivitis. A sample of any discharge may be collected on a cotton-tip swab if you have a severe case of conjunctivitis, if your cornea is affected, or if you keep getting repeat infections that do not respond to treatment. The sample will be sent to a lab to see if the inflammation is caused by a bacterial infection and to see if the infection will respond to antibiotic medicines. TREATMENT   Bacterial conjunctivitis is treated with antibiotics. Antibiotic eyedrops are most often used. However, antibiotic ointments are also available. Antibiotics pills are  sometimes used. Artificial tears or eye washes may ease discomfort. HOME CARE INSTRUCTIONS   To ease discomfort, apply a cool, clean wash cloth to your eye for 10 20 minutes, 3 4 times a day.  Gently wipe away any drainage from your eye with a warm, wet washcloth or a cotton ball.  Wash your hands often with soap and water. Use paper towels to dry your hands.  Do not share towels or wash cloths. This may spread the infection.  Change or wash your pillow case every day.  You should not use eye makeup until the infection is gone.  Do not operate machinery or drive if your vision is blurred.  Stop using contacts lenses. Ask your caregiver how to sterilize or replace your contacts before using them again. This depends on the type of contact lenses that you use.  When applying medicine to the infected eye, do not touch the edge of your eyelid with the eyedrop bottle or ointment tube. SEEK IMMEDIATE MEDICAL CARE IF:   Your infection has not improved within 3 days after beginning treatment.  You had yellow discharge from your eye and it returns.  You have increased eye pain.  Your eye redness is spreading.  Your vision becomes blurred.  You have a fever or persistent symptoms for more than 2 3 days.  You have a fever and your symptoms suddenly get worse.  You have facial pain, redness, or swelling. MAKE SURE YOU:   Understand these instructions.  Will watch your condition.  Will get help right away if you are not doing well or get worse. Document Released: 02/20/2005 Document Revised:  11/15/2011 Document Reviewed: 07/24/2011 ExitCare Patient Information 2014 [employer].

## 2013-05-13 NOTE — Progress Notes (Signed)
Patient ID: [name], female   DOB: Oct 05, 1965, 48 y.o.   MRN: [id number]     Subjective:    Patient ID: [name], female    DOB: May 11, 1965, 48 y.o.   MRN: [id number]  Patient presents for eye infection  Patient here with left eye drainage for the past 48 hours. Her grandson and her daughter both have had conjunctivitis bacterial. She's not had any fevers sinus pressure drainage from the upper respiratory symptoms her vision is slightly blurred more from irritation in her left eye   Review Of Systems:  GEN- denies fatigue, fever, weight loss,weakness, recent illness HEENT- + eye drainage, change in vision, nasal discharge, CVS- denies chest pain, palpitations RESP- denies SOB, cough, wheeze Neuro- denies headache, dizziness, syncope, seizure activity       Objective:    BP 118/58  Pulse 80  Temp(Src) 98 F (36.7 C)  Resp 16  Ht 5\' 5"  (1.651 m)  Wt 196 lb (88.905 kg)  BMI 32.62 kg/m2 GEN- NAD, alert and oriented x3 HEENT- PERRL, EOMI,,+ injected left conjunctiva, yellow pus draining, vision grossly in tact,MMM, oropharynx clear Neck- Supple, no LAD         Assessment & Plan:      Problem List Items Addressed This Visit   None    Visit Diagnoses   Conjunctivitis, left eye    -  Primary    polytrim, warm compresses, work note given       Note: This dictation was prepared with Sales executive along with smaller Company secretary. Any transcriptional errors that result from this process are unintentional.

## 2013-05-16 ENCOUNTER — Telehealth: Payer: Self-pay | Admitting: *Deleted

## 2013-05-16 NOTE — Telephone Encounter (Signed)
Received call from patient.   Was seen on 05/13/2013 for pink eye. Polytrim opth solution ordered.   Reports that inflammation to eye has worsened, and now she has headache and swollen lymph nodes to throat.   MD please advise.

## 2013-05-16 NOTE — Telephone Encounter (Signed)
NTBS.  If no appointments available, suggest urgent care.

## 2013-05-16 NOTE — Telephone Encounter (Signed)
Call placed to patient and patient made aware.   Advised to go to Urgent Care.

## 2013-05-18 ENCOUNTER — Emergency Department (HOSPITAL_COMMUNITY): Payer: PRIVATE HEALTH INSURANCE

## 2013-05-18 ENCOUNTER — Inpatient Hospital Stay (HOSPITAL_COMMUNITY)
Admission: EM | Admit: 2013-05-18 | Discharge: 2013-05-23 | DRG: 603 | Disposition: A | Discharge status: Home Health | Payer: PRIVATE HEALTH INSURANCE | Attending: Family Medicine | Admitting: Family Medicine

## 2013-05-18 ENCOUNTER — Encounter (HOSPITAL_COMMUNITY): Payer: Self-pay | Admitting: Emergency Medicine

## 2013-05-18 DIAGNOSIS — H11429 Conjunctival edema, unspecified eye: Secondary | ICD-10-CM | POA: Diagnosis present

## 2013-05-18 DIAGNOSIS — I10 Essential (primary) hypertension: Secondary | ICD-10-CM | POA: Diagnosis present

## 2013-05-18 DIAGNOSIS — L03211 Cellulitis of face: Principal | ICD-10-CM | POA: Diagnosis present

## 2013-05-18 DIAGNOSIS — R202 Paresthesia of skin: Secondary | ICD-10-CM

## 2013-05-18 DIAGNOSIS — R51 Headache: Secondary | ICD-10-CM

## 2013-05-18 DIAGNOSIS — I639 Cerebral infarction, unspecified: Secondary | ICD-10-CM | POA: Diagnosis present

## 2013-05-18 DIAGNOSIS — R200 Anesthesia of skin: Secondary | ICD-10-CM | POA: Diagnosis present

## 2013-05-18 DIAGNOSIS — Z833 Family history of diabetes mellitus: Secondary | ICD-10-CM

## 2013-05-18 DIAGNOSIS — Z8249 Family history of ischemic heart disease and other diseases of the circulatory system: Secondary | ICD-10-CM

## 2013-05-18 DIAGNOSIS — Z6830 Body mass index (BMI) 30.0-30.9, adult: Secondary | ICD-10-CM

## 2013-05-18 DIAGNOSIS — F3289 Other specified depressive episodes: Secondary | ICD-10-CM | POA: Diagnosis present

## 2013-05-18 DIAGNOSIS — E669 Obesity, unspecified: Secondary | ICD-10-CM | POA: Diagnosis present

## 2013-05-18 DIAGNOSIS — L0201 Cutaneous abscess of face: Principal | ICD-10-CM | POA: Diagnosis present

## 2013-05-18 DIAGNOSIS — H109 Unspecified conjunctivitis: Secondary | ICD-10-CM | POA: Diagnosis present

## 2013-05-18 DIAGNOSIS — F411 Generalized anxiety disorder: Secondary | ICD-10-CM | POA: Diagnosis present

## 2013-05-18 DIAGNOSIS — M6281 Muscle weakness (generalized): Secondary | ICD-10-CM

## 2013-05-18 DIAGNOSIS — R531 Weakness: Secondary | ICD-10-CM | POA: Diagnosis present

## 2013-05-18 DIAGNOSIS — L03213 Periorbital cellulitis: Secondary | ICD-10-CM | POA: Diagnosis present

## 2013-05-18 DIAGNOSIS — F419 Anxiety disorder, unspecified: Secondary | ICD-10-CM | POA: Diagnosis present

## 2013-05-18 DIAGNOSIS — G43909 Migraine, unspecified, not intractable, without status migrainosus: Secondary | ICD-10-CM | POA: Diagnosis present

## 2013-05-18 DIAGNOSIS — F329 Major depressive disorder, single episode, unspecified: Secondary | ICD-10-CM | POA: Diagnosis present

## 2013-05-18 DIAGNOSIS — Z880 Allergy status to penicillin: Secondary | ICD-10-CM

## 2013-05-18 DIAGNOSIS — L719 Rosacea, unspecified: Secondary | ICD-10-CM

## 2013-05-18 DIAGNOSIS — H00039 Abscess of eyelid unspecified eye, unspecified eyelid: Secondary | ICD-10-CM | POA: Diagnosis present

## 2013-05-18 DIAGNOSIS — R209 Unspecified disturbances of skin sensation: Secondary | ICD-10-CM

## 2013-05-18 DIAGNOSIS — Z800 Family history of malignant neoplasm of digestive organs: Secondary | ICD-10-CM

## 2013-05-18 DIAGNOSIS — H05229 Edema of unspecified orbit: Secondary | ICD-10-CM | POA: Diagnosis present

## 2013-05-18 LAB — CBC
HEMATOCRIT: 41.6 % (ref 36.0–46.0)
HEMOGLOBIN: 13.9 g/dL (ref 12.0–15.0)
MCH: 29.3 pg (ref 26.0–34.0)
MCHC: 33.4 g/dL (ref 30.0–36.0)
MCV: 87.8 fL (ref 78.0–100.0)
Platelets: 236 10*3/uL (ref 150–400)
RBC: 4.74 MIL/uL (ref 3.87–5.11)
RDW: 13 % (ref 11.5–15.5)
WBC: 4.4 10*3/uL (ref 4.0–10.5)

## 2013-05-18 LAB — DIFFERENTIAL
Basophils Absolute: 0 10*3/uL (ref 0.0–0.1)
Basophils Relative: 0 % (ref 0–1)
EOS PCT: 2 % (ref 0–5)
Eosinophils Absolute: 0.1 10*3/uL (ref 0.0–0.7)
LYMPHS PCT: 32 % (ref 12–46)
Lymphs Abs: 1.4 10*3/uL (ref 0.7–4.0)
MONO ABS: 0.5 10*3/uL (ref 0.1–1.0)
MONOS PCT: 11 % (ref 3–12)
Neutro Abs: 2.4 10*3/uL (ref 1.7–7.7)
Neutrophils Relative %: 55 % (ref 43–77)

## 2013-05-18 LAB — COMPREHENSIVE METABOLIC PANEL
ALT: 63 U/L — ABNORMAL HIGH (ref 0–35)
AST: 43 U/L — AB (ref 0–37)
Albumin: 3.7 g/dL (ref 3.5–5.2)
Alkaline Phosphatase: 175 U/L — ABNORMAL HIGH (ref 39–117)
BILIRUBIN TOTAL: 0.3 mg/dL (ref 0.3–1.2)
BUN: 12 mg/dL (ref 6–23)
CHLORIDE: 100 meq/L (ref 96–112)
CO2: 27 meq/L (ref 19–32)
CREATININE: 0.55 mg/dL (ref 0.50–1.10)
Calcium: 9.4 mg/dL (ref 8.4–10.5)
GFR calc Af Amer: >90 mL/min (ref >90)
GFR calc non Af Amer: >90 mL/min (ref >90)
GLUCOSE: 82 mg/dL (ref 70–99)
Potassium: 4 mEq/L (ref 3.7–5.3)
Sodium: 139 mEq/L (ref 137–147)
Total Protein: 7.7 g/dL (ref 6.0–8.3)

## 2013-05-18 LAB — RAPID URINE DRUG SCREEN, HOSP PERFORMED
Amphetamines: NOT DETECTED
BARBITURATES: NOT DETECTED
Benzodiazepines: NOT DETECTED
Cocaine: NOT DETECTED
OPIATES: POSITIVE — AB
Tetrahydrocannabinol: NOT DETECTED

## 2013-05-18 LAB — URINALYSIS, ROUTINE W REFLEX MICROSCOPIC
Bilirubin Urine: NEGATIVE
Glucose, UA: NEGATIVE mg/dL
Ketones, ur: NEGATIVE mg/dL
Leukocytes, UA: NEGATIVE
NITRITE: NEGATIVE
Protein, ur: NEGATIVE mg/dL
Urobilinogen, UA: 0.2 mg/dL (ref 0.0–1.0)
pH: 6.5 (ref 5.0–8.0)

## 2013-05-18 LAB — PREGNANCY, URINE: PREG TEST UR: NEGATIVE

## 2013-05-18 LAB — TROPONIN I: Troponin I: <0.3 ng/mL (ref <0.30)

## 2013-05-18 LAB — APTT: APTT: 32 s (ref 24–37)

## 2013-05-18 LAB — PROTIME-INR
INR: 0.96 (ref 0.00–1.49)
Prothrombin Time: 12.6 seconds (ref 11.6–15.2)

## 2013-05-18 LAB — CBG MONITORING, ED: Glucose-Capillary: 74 mg/dL (ref 70–99)

## 2013-05-18 LAB — URINE MICROSCOPIC-ADD ON

## 2013-05-18 LAB — ETHANOL

## 2013-05-18 MED ORDER — ONDANSETRON HCL 4 MG/2ML IJ SOLN: 4.0000 mg | Freq: Once | INTRAMUSCULAR | Status: DC

## 2013-05-18 MED ORDER — VANCOMYCIN HCL IN DEXTROSE 1-5 GM/200ML-% IV SOLN
1000.0000 mg | Freq: Once | INTRAVENOUS | Status: AC
  Administered 2013-05-18: 1000 mg via INTRAVENOUS
  Filled 2013-05-18: qty 200

## 2013-05-18 MED ORDER — VANCOMYCIN HCL IN DEXTROSE 1-5 GM/200ML-% IV SOLN
1000.0000 mg | Freq: Two times a day (BID) | INTRAVENOUS | Status: DC
  Administered 2013-05-19 – 2013-05-22 (×8): 1000 mg via INTRAVENOUS
  Filled 2013-05-18 (×9): qty 200

## 2013-05-18 MED ORDER — ONDANSETRON HCL 4 MG/2ML IJ SOLN
4.0000 mg | Freq: Once | INTRAMUSCULAR | Status: AC
  Administered 2013-05-18: 4 mg via INTRAVENOUS

## 2013-05-18 MED ORDER — ONDANSETRON HCL 4 MG/2ML IJ SOLN
4.0000 mg | Freq: Four times a day (QID) | INTRAMUSCULAR | Status: DC | PRN
  Administered 2013-05-20: 4 mg via INTRAVENOUS
  Filled 2013-05-18: qty 2

## 2013-05-18 MED ORDER — ESCITALOPRAM OXALATE 10 MG PO TABS
10.0000 mg | ORAL_TABLET | Freq: Every day | ORAL | Status: DC
  Administered 2013-05-19 – 2013-05-23 (×5): 10 mg via ORAL
  Filled 2013-05-18 (×5): qty 1

## 2013-05-18 MED ORDER — CIPROFLOXACIN IN D5W 400 MG/200ML IV SOLN
400.0000 mg | Freq: Two times a day (BID) | INTRAVENOUS | Status: DC
Start: 2013-05-18 — End: 2013-05-18

## 2013-05-18 MED ORDER — ONDANSETRON HCL 4 MG PO TABS: 4.0000 mg | ORAL_TABLET | Freq: Four times a day (QID) | ORAL | Status: DC | PRN

## 2013-05-18 MED ORDER — SODIUM CHLORIDE 0.9 % IV SOLN
INTRAVENOUS | Status: DC
  Administered 2013-05-18: 18:00:00 via INTRAVENOUS

## 2013-05-18 MED ORDER — HYDROCODONE-ACETAMINOPHEN 5-325 MG PO TABS
1.0000 | ORAL_TABLET | ORAL | Status: DC | PRN
Start: 2013-05-18 — End: 2013-05-23
  Administered 2013-05-18: 2 via ORAL
  Administered 2013-05-19 (×2): 1 via ORAL
  Administered 2013-05-19: 2 via ORAL
  Administered 2013-05-19 – 2013-05-21 (×5): 1 via ORAL
  Administered 2013-05-21: 2 via ORAL
  Administered 2013-05-21: 1 via ORAL
  Administered 2013-05-21: 2 via ORAL
  Administered 2013-05-21: 1 via ORAL
  Administered 2013-05-22: 2 via ORAL
  Filled 2013-05-18: qty 1
  Filled 2013-05-18: qty 2
  Filled 2013-05-18: qty 1
  Filled 2013-05-18: qty 2
  Filled 2013-05-18 (×4): qty 1
  Filled 2013-05-18 (×2): qty 2
  Filled 2013-05-18 (×3): qty 1
  Filled 2013-05-18 (×3): qty 2

## 2013-05-18 MED ORDER — SODIUM CHLORIDE 0.9 % IJ SOLN
3.0000 mL | Freq: Two times a day (BID) | INTRAMUSCULAR | Status: DC
  Administered 2013-05-18 – 2013-05-21 (×3): 3 mL via INTRAVENOUS

## 2013-05-18 MED ORDER — PROMETHAZINE HCL 25 MG/ML IJ SOLN
25.0000 mg | Freq: Once | INTRAMUSCULAR | Status: AC
  Administered 2013-05-18: 25 mg via INTRAVENOUS
  Filled 2013-05-18: qty 1

## 2013-05-18 MED ORDER — MORPHINE SULFATE 4 MG/ML IJ SOLN
INTRAMUSCULAR | Status: AC
  Filled 2013-05-18: qty 1

## 2013-05-18 MED ORDER — IOHEXOL 300 MG/ML  SOLN
80.0000 mL | Freq: Once | INTRAMUSCULAR | Status: AC | PRN
  Administered 2013-05-18: 80 mL via INTRAVENOUS

## 2013-05-18 MED ORDER — SODIUM CHLORIDE 0.9 % IV SOLN
250.0000 mL | INTRAVENOUS | Status: DC | PRN
  Administered 2013-05-19: 250 mL via INTRAVENOUS

## 2013-05-18 MED ORDER — GATIFLOXACIN 0.5 % OP SOLN
1.0000 [drp] | Freq: Four times a day (QID) | OPHTHALMIC | Status: DC
  Administered 2013-05-18 – 2013-05-19 (×3): 1 [drp] via OPHTHALMIC
  Filled 2013-05-18: qty 2.5

## 2013-05-18 MED ORDER — ASPIRIN EC 81 MG PO TBEC
81.0000 mg | DELAYED_RELEASE_TABLET | Freq: Every day | ORAL | Status: DC
Start: 2013-05-18 — End: 2013-05-21
  Administered 2013-05-19 – 2013-05-21 (×3): 81 mg via ORAL
  Filled 2013-05-18 (×3): qty 1

## 2013-05-18 MED ORDER — ONDANSETRON HCL 4 MG/2ML IJ SOLN
INTRAMUSCULAR | Status: AC
Start: 2013-05-18 — End: 2013-05-19
  Filled 2013-05-18: qty 2

## 2013-05-18 MED ORDER — SODIUM CHLORIDE 0.9 % IJ SOLN
3.0000 mL | Freq: Two times a day (BID) | INTRAMUSCULAR | Status: DC
  Administered 2013-05-18 – 2013-05-22 (×8): 3 mL via INTRAVENOUS

## 2013-05-18 MED ORDER — TETRACAINE HCL 0.5 % OP SOLN
2.0000 [drp] | Freq: Once | OPHTHALMIC | Status: AC
  Administered 2013-05-18: 2 [drp] via OPHTHALMIC
  Filled 2013-05-18: qty 2

## 2013-05-18 MED ORDER — CIPROFLOXACIN IN D5W 400 MG/200ML IV SOLN
400.0000 mg | Freq: Once | INTRAVENOUS | Status: AC
  Administered 2013-05-18: 400 mg via INTRAVENOUS
  Filled 2013-05-18: qty 200

## 2013-05-18 MED ORDER — ENOXAPARIN SODIUM 40 MG/0.4ML ~~LOC~~ SOLN
40.0000 mg | SUBCUTANEOUS | Status: DC
  Administered 2013-05-18 – 2013-05-21 (×4): 40 mg via SUBCUTANEOUS
  Filled 2013-05-18 (×4): qty 0.4

## 2013-05-18 MED ORDER — GI COCKTAIL ~~LOC~~
30.0000 mL | Freq: Once | ORAL | Status: AC
  Administered 2013-05-18: 30 mL via ORAL
  Filled 2013-05-18: qty 30

## 2013-05-18 MED ORDER — MORPHINE SULFATE 4 MG/ML IJ SOLN
4.0000 mg | Freq: Once | INTRAMUSCULAR | Status: AC
  Administered 2013-05-18: 4 mg via INTRAVENOUS

## 2013-05-18 MED ORDER — CIPROFLOXACIN IN D5W 400 MG/200ML IV SOLN
400.0000 mg | Freq: Two times a day (BID) | INTRAVENOUS | Status: DC
  Administered 2013-05-19 – 2013-05-22 (×8): 400 mg via INTRAVENOUS
  Filled 2013-05-18 (×8): qty 200

## 2013-05-18 MED ORDER — POLYMYXIN B-TRIMETHOPRIM 10000-0.1 UNIT/ML-% OP SOLN
1.0000 [drp] | Freq: Four times a day (QID) | OPHTHALMIC | Status: DC
  Administered 2013-05-19: 1 [drp] via OPHTHALMIC
  Filled 2013-05-18: qty 10

## 2013-05-18 MED ORDER — SODIUM CHLORIDE 0.9 % IJ SOLN: 3.0000 mL | INTRAMUSCULAR | Status: DC | PRN

## 2013-05-18 MED ORDER — FLUORESCEIN SODIUM 1 MG OP STRP
1.0000 | ORAL_STRIP | Freq: Once | OPHTHALMIC | Status: AC
  Administered 2013-05-18: 1 via OPHTHALMIC
  Filled 2013-05-18: qty 1

## 2013-05-18 MED ORDER — ONDANSETRON HCL 4 MG/2ML IJ SOLN
4.0000 mg | Freq: Once | INTRAMUSCULAR | Status: AC
  Administered 2013-05-18: 4 mg via INTRAVENOUS
  Filled 2013-05-18: qty 2

## 2013-05-18 NOTE — Progress Notes (Signed)
ANTIBIOTIC CONSULT NOTE  Pharmacy Consult for Vancomycin Indication: Cellulitis  Allergies  Allergen Reactions  . Penicillins Hives    Patient Measurements: Height: 5\' 6"  (167.6 cm) Weight: 197 lb 3.2 oz (89.449 kg) IBW/kg (Calculated) : 59.3  Vital Signs: Temp: 98.3 F (36.8 C) (03/15 1906) Temp src: Oral (03/15 1906) BP: 95/61 mmHg (03/15 1906) Pulse Rate: 65 (03/15 1906) Intake/Output from previous day:   Intake/Output from this shift:    Labs:  Recent Labs  05/18/13 1532  WBC 4.4  HGB 13.9  PLT 236  CREATININE 0.55   Estimated Creatinine Clearance: 100 ml/min (by C-G formula based on Cr of 0.55). No results found for this basename: VANCOTROUGH, VANCOPEAK, VANCORANDOM, GENTTROUGH, GENTPEAK, GENTRANDOM, TOBRATROUGH, TOBRAPEAK, TOBRARND, AMIKACINPEAK, AMIKACINTROU, AMIKACIN,  in the last 72 hours   Microbiology: No results found for this or any previous visit (from the past 720 hour(s)).  Medical History: Past Medical History  Diagnosis Date  . Depression   . Hypertension   . Anxiety   . Rosacea    Medications:  Prescriptions prior to admission  Medication Sig Dispense Refill  . azithromycin (ZITHROMAX Z-PAK) 250 MG tablet Take 250-500 mg by mouth daily. Started on 05/16/13      . escitalopram (LEXAPRO) 10 MG tablet Take 1 tablet (10 mg total) by mouth daily.  30 tablet  3  . moxifloxacin (VIGAMOX) 0.5 % ophthalmic solution Place 1 drop into both eyes 3 (three) times daily.      Marland Kitchen trimethoprim-polymyxin b (POLYTRIM) ophthalmic solution Place 1 drop into the left eye 4 (four) times daily.  10 mL  0   Assessment: Okay for Protocol.  48 yo female w/ Periorbital cellulitis of left eye- not improving on eye drops and to be placed on iv vanc and cipro w/ continuation of eye drops.  Patient received 1gm Vancomycin in ED.  Vancomycin 3/15 >> Cipro 3/15 >>  Goal of Therapy:  Vancomycin trough level 10-15 mcg/ml  Plan:  Vancomycin 1gm IV every 12  hours. Trough at steady state. Follow up culture results  Pricilla Larsson 05/18/2013,7:12 PM

## 2013-05-18 NOTE — ED Notes (Signed)
Pt c/o conjunctivitis since Tuesday with no relief from prescription eye drops and po antibiotics. Pt states sx are worse and she now has left side ha and numbness and tinging since 0800 this am.

## 2013-05-18 NOTE — ED Notes (Signed)
250 ML bag finished infusing.

## 2013-05-18 NOTE — H&P (Signed)
PCP:   Odette Fraction, MD   Chief Complaint:  Left eye pain  HPI: 48 yo yo female has been having conjuctivitis for 5 days in both eyes.  Was placed on cipro and bactrim eye drops then.  The left has gotten worse for the last 2 days, with more swelling and redness.  Fever for over 24 hours.  Nauseas no vomiting.  No cough.  Also noted in ED to have a very bad headache has h/o migraines but this is different.  With associated left sided numb and tingling with weakness in left arm and leg.  No neck pain.  Has not been on recent abx except eye drops.  No difficulty speaking, talking, swallowing.  No facial drooping.  Review of Systems:  Positive and negative as per HPI otherwise all other systems are negative  Past Medical History: Past Medical History  Diagnosis Date  . Depression   . Hypertension   . Anxiety   . Rosacea    Past Surgical History  Procedure Laterality Date  . Appendectomy    . Cholecystectomy    . Tonsillectomy    . Lumbar      ruptured disc in lumbar taken out  . Back surgery    . Appendectomy    . Cholecystectomy    . Abdominal hysterectomy      non cancerous    Medications: Prior to Admission medications   Medication Sig Start Date End Date Taking? Authorizing Provider  azithromycin (ZITHROMAX Z-PAK) 250 MG tablet Take 250-500 mg by mouth daily. Started on 05/16/13   Yes Historical Provider, MD  escitalopram (LEXAPRO) 10 MG tablet Take 1 tablet (10 mg total) by mouth daily. 01/06/13  Yes [name], MD  moxifloxacin (VIGAMOX) 0.5 % ophthalmic solution Place 1 drop into both eyes 3 (three) times daily.   Yes Historical Provider, MD  trimethoprim-polymyxin b (POLYTRIM) ophthalmic solution Place 1 drop into the left eye 4 (four) times daily. 05/13/13  Yes Alycia Rossetti, MD    Allergies:   Allergies  Allergen Reactions  . Penicillins Hives    Social History:  reports that she has never smoked. She has never used smokeless tobacco. She  reports that she does not drink alcohol or use illicit drugs.  Family History: Family History  Problem Relation Age of Onset  . Diabetes Father   . Heart disease Father 72  . Hypertension Father   . Cancer Maternal Grandfather     colon cancer (70's)  . Diabetes Paternal Grandmother   . Diabetes Paternal Grandfather   . Cancer Cousin     breast    Physical Exam: Filed Vitals:   05/18/13 1421  BP: 150/96  Pulse: 86  Temp: 98.2 F (36.8 C)  TempSrc: Oral  Resp: 18  Height: 5\' 6"  (1.676 m)  Weight: 86.183 kg (190 lb)  SpO2: 100%   General appearance: alert, cooperative and no distress Head: Normocephalic, without obvious abnormality, atraumatic Eyes: positive findings: eyelids/periorbital: periorbital edema on the left upper eyelid swollen and red.  Bilateral conjuncitivits  eomi bilaterally Nose: Nares normal. Septum midline. Mucosa normal. No drainage or sinus tenderness. Neck: no JVD and supple, symmetrical, trachea midline Lungs: clear to auscultation bilaterally Heart: regular rate and rhythm, S1, S2 normal, no murmur, click, rub or gallop Abdomen: soft, non-tender; bowel sounds normal; no masses,  no organomegaly Extremities: extremities normal, atraumatic, no cyanosis or edema Pulses: 2+ and symmetric Skin: Skin color, texture, turgor normal. No rashes or lesions  Neurologic: Grossly normal except 4/5 strenght left arm and leg   Labs on Admission:   Recent Labs  05/18/13 1532  NA 139  K 4.0  CL 100  CO2 27  GLUCOSE 82  BUN 12  CREATININE 0.55  CALCIUM 9.4    Recent Labs  05/18/13 1532  AST 43*  ALT 63*  ALKPHOS 175*  BILITOT 0.3  PROT 7.7  ALBUMIN 3.7    Recent Labs  05/18/13 1532  WBC 4.4  NEUTROABS 2.4  HGB 13.9  HCT 41.6  MCV 87.8  PLT 236    Recent Labs  05/18/13 1532  TROPONINI <0.30   Radiological Exams on Admission: Ct Head Wo Contrast  05/18/2013   CLINICAL DATA:  Severe headache and eye pressure.  EXAM: CT HEAD WITHOUT  CONTRAST  TECHNIQUE: Contiguous axial images were obtained from the base of the skull through the vertex without intravenous contrast.  COMPARISON:  CT HEAD W/O CM dated 03/01/2004; CT ORBITS W/CM dated 05/18/2013  FINDINGS: Sinuses/Soft tissues: Left periorbital soft tissue swelling will be better evaluated on contrast-enhanced dedicated CT. Right-sided mastoid effusion is chronic  Intracranial: No mass lesion, hemorrhage, hydrocephalus, acute infarct, intra-axial, or extra-axial fluid collection.  IMPRESSION: 1.  No acute intracranial abnormality. 2. Left periorbital soft tissue swelling, better evaluated on dedicated orbit CT. Please see that report. 3. Chronic right-sided mastoid effusion.   Electronically Signed   By: Abigail Miyamoto M.D.   On: 05/18/2013 15:30   Ct Orbits W/cm  05/18/2013   CLINICAL DATA:  Left orbital cellulitis. Overall draining and swelling for 5 days.  EXAM: CT ORBITS WITH CONTRAST  TECHNIQUE: Multidetector CT imaging of the orbits was performed following the bolus administration of intravenous contrast.  CONTRAST:  8mL OMNIPAQUE IOHEXOL 300 MG/ML  SOLN  COMPARISON:  CT head from the same day.  FINDINGS: Limited imaging of the postcontrast brain is unremarkable. And and a periorbital soft tissue swelling and is evident within the eyelids and along the lateral margin of the orbit. The globe is intact. There may be a small abscess within the left upper lid laterally measuring 4 x 14 mm  There is no evidence for postseptal disease. The extraocular muscles are intact. The cavernous sinus is within normal limits bilaterally. The visualized paranasal sinuses are clear.  IMPRESSION: 1. Left lateral periorbital cellulitis with a developing abscess in the left upper lid. 2. No evidence for postseptal invasion.   Electronically Signed   By: Lawrence Santiago M.D.   On: 05/18/2013 15:29    Assessment/Plan  48 yo female with left periorbital cellulitis, and left sided weakness/n/t  Principal  Problem:   Periorbital cellulitis of left eye-  Cont eye drops and place on iv vanc and cipro.  Very small fluid collection in left upper lid, should improve with iv abx.    Active Problems:   Hypertension   Anxiety   Left-sided weakness unsure if complicated migraine or cva.  Ct does not show any intracranial absess.  Obtain mri in am.  Neuro cks.  Aspirin.     Numbness and tingling of left arm and leg  Full code.  Admit to tele for r/o cva w/u.  ekg nsr.   Chelbi Herber A 05/18/2013, 4:49 PM

## 2013-05-18 NOTE — ED Notes (Signed)
Per Hospitialist pt could have PO fluids.

## 2013-05-18 NOTE — ED Provider Notes (Signed)
CSN: [id number]     Arrival date & time 05/18/13  1417 History   This chart was scribed for Ezequiel Essex, MD by Lovena Le Day, ED scribe. This patient was seen in room APA17/APA17 and the patient's care was started at 1417.  Chief Complaint  Patient presents with  . Eye Problem   The history is provided by the patient. No language interpreter was used.   HPI Comments: [name] is a 48 y.o. female who presents to the Emergency Department complaining of redness, swelling, pain and watery eyes, left eye worse than right eye with the swelling around of her left eye extending into the left side of her face. She also reports began w/left sided HA x2 days ago and that she did noticed weakness and numbness of her left arm 2 nights ago which has been persistant since onset, she had some paraesthesias to the left side of her face also. She denies any hx of migraine HAs.   She reports was seen for her eye problem 5 days ago and tx for pink eye of her left eye which has since spread to her right eye as well w/eye drops and oral antibiotics. She reports contacts also w/pink eye. She reports fever 101 F last PM. She reports associated decreased visual acuity and blurry/double vision. She denies any trauma to her eyes and she does not wear contacts/glasses. She denies CP or SOB.   lexapro for anxiety and depression  No hx DM,  No hx migraines  Past Medical History  Diagnosis Date  . Depression   . Hypertension   . Anxiety   . Rosacea    Past Surgical History  Procedure Laterality Date  . Appendectomy    . Cholecystectomy    . Tonsillectomy    . Lumbar      ruptured disc in lumbar taken out  . Back surgery    . Appendectomy    . Cholecystectomy    . Abdominal hysterectomy      non cancerous   Family History  Problem Relation Age of Onset  . Diabetes Father   . Heart disease Father 76  . Hypertension Father   . Cancer Maternal Grandfather     colon cancer (70's)  . Diabetes  Paternal Grandmother   . Diabetes Paternal Grandfather   . Cancer Cousin     breast   History  Substance Use Topics  . Smoking status: Never Smoker   . Smokeless tobacco: Never Used  . Alcohol Use: No   OB History   Grav Para Term Preterm Abortions TAB SAB Ect Mult Living                 Review of Systems  Constitutional: Negative for fever and chills.  HENT: Negative for congestion and rhinorrhea.   Eyes: Positive for pain and redness.  Respiratory: Negative for cough and shortness of breath.   Cardiovascular: Negative for chest pain.  Gastrointestinal: Negative for nausea, vomiting, abdominal pain and diarrhea.  Musculoskeletal: Negative for back pain.  Skin: Negative for color change and rash.  Neurological: Positive for weakness, numbness and headaches. Negative for syncope.  All other systems reviewed and are negative.   Allergies  Penicillins  Home Medications   Current Outpatient Rx  Name  Route  Sig  Dispense  Refill  . azithromycin (ZITHROMAX Z-PAK) 250 MG tablet   Oral   Take 250-500 mg by mouth daily. Started on 05/16/13         .  escitalopram (LEXAPRO) 10 MG tablet   Oral   Take 1 tablet (10 mg total) by mouth daily.   30 tablet   3   . moxifloxacin (VIGAMOX) 0.5 % ophthalmic solution   Both Eyes   Place 1 drop into both eyes 3 (three) times daily.         Marland Kitchen trimethoprim-polymyxin b (POLYTRIM) ophthalmic solution   Left Eye   Place 1 drop into the left eye 4 (four) times daily.   10 mL   0    Triage Vitals: BP 150/96  Pulse 86  Temp(Src) 98.2 F (36.8 C) (Oral)  Resp 18  Ht 5\' 6"  (1.676 m)  Wt 190 lb (86.183 kg)  BMI 30.68 kg/m2  SpO2 100%  Physical Exam  Nursing note and vitals reviewed. Constitutional: She is oriented to person, place, and time. She appears well-developed and well-nourished. No distress.  HENT:  Head: Normocephalic and atraumatic.  Mouth/Throat: Oropharynx is clear and moist. No oropharyngeal exudate.  Eyes:  Conjunctivae are normal. Right eye exhibits no discharge. Left eye exhibits no discharge.  conjuctival and scleral injection on the right Left she has periorbital edema, erythema, conjuctival injection w/signfiicant chemosis Pain w/EOM on left No fluorescence uptake bilaterally  Neck: Normal range of motion.  Cardiovascular: Normal rate.   Pulmonary/Chest: Effort normal. No respiratory distress.  Musculoskeletal: Normal range of motion. She exhibits no edema.  Neurological: She is alert and oriented to person, place, and time.  4/5 strength left arm w/pronator drift  Difficulty holding up left leg off of the bed Subjective tingling and numbness left arm.  Smile symmetrical, eyebrow raise symmetric.   Skin: Skin is warm and dry.  Psychiatric: She has a normal mood and affect. Thought content normal.    ED Course  Procedures (including critical care time) DIAGNOSTIC STUDIES: Oxygen Saturation is 100% on room air, normal by my interpretation.    COORDINATION OF CARE: At 240 PM Discussed treatment plan with patient which includes head CT, blood work, UA/UD, neuro checks. Patient agrees.   Labs Review Labs Reviewed  COMPREHENSIVE METABOLIC PANEL - Abnormal; Notable for the following:    AST 43 (*)    ALT 63 (*)    Alkaline Phosphatase 175 (*)    All other components within normal limits  URINE RAPID DRUG SCREEN (HOSP PERFORMED) - Abnormal; Notable for the following:    Opiates POSITIVE (*)    All other components within normal limits  URINALYSIS, ROUTINE W REFLEX MICROSCOPIC - Abnormal; Notable for the following:    Specific Gravity, Urine <1.005 (*)    Hgb urine dipstick TRACE (*)    All other components within normal limits  ETHANOL  PROTIME-INR  APTT  CBC  DIFFERENTIAL  TROPONIN I  PREGNANCY, URINE  URINE MICROSCOPIC-ADD ON  CBG MONITORING, ED   Imaging Review Ct Head Wo Contrast  05/18/2013   CLINICAL DATA:  Severe headache and eye pressure.  EXAM: CT HEAD WITHOUT  CONTRAST  TECHNIQUE: Contiguous axial images were obtained from the base of the skull through the vertex without intravenous contrast.  COMPARISON:  CT HEAD W/O CM dated 03/01/2004; CT ORBITS W/CM dated 05/18/2013  FINDINGS: Sinuses/Soft tissues: Left periorbital soft tissue swelling will be better evaluated on contrast-enhanced dedicated CT. Right-sided mastoid effusion is chronic  Intracranial: No mass lesion, hemorrhage, hydrocephalus, acute infarct, intra-axial, or extra-axial fluid collection.  IMPRESSION: 1.  No acute intracranial abnormality. 2. Left periorbital soft tissue swelling, better evaluated on dedicated orbit CT. Please  see that report. 3. Chronic right-sided mastoid effusion.   Electronically Signed   By: Abigail Miyamoto M.D.   On: 05/18/2013 15:30   Ct Orbits W/cm  05/18/2013   CLINICAL DATA:  Left orbital cellulitis. Overall draining and swelling for 5 days.  EXAM: CT ORBITS WITH CONTRAST  TECHNIQUE: Multidetector CT imaging of the orbits was performed following the bolus administration of intravenous contrast.  CONTRAST:  49mL OMNIPAQUE IOHEXOL 300 MG/ML  SOLN  COMPARISON:  CT head from the same day.  FINDINGS: Limited imaging of the postcontrast brain is unremarkable. And and a periorbital soft tissue swelling and is evident within the eyelids and along the lateral margin of the orbit. The globe is intact. There may be a small abscess within the left upper lid laterally measuring 4 x 14 mm  There is no evidence for postseptal disease. The extraocular muscles are intact. The cavernous sinus is within normal limits bilaterally. The visualized paranasal sinuses are clear.  IMPRESSION: 1. Left lateral periorbital cellulitis with a developing abscess in the left upper lid. 2. No evidence for postseptal invasion.   Electronically Signed   By: Lawrence Santiago M.D.   On: 05/18/2013 15:29     EKG Interpretation   Date/Time:  Sunday May 18 2013 15:15:25 EDT Ventricular Rate:  75 PR Interval:   140 QRS Duration: 80 QT Interval:  374 QTC Calculation: 417 R Axis:   33 Text Interpretation:  Normal sinus rhythm Normal ECG When compared with  ECG of 19-Aug-2012 19:23, Premature ventricular complexes are no longer  Present No significant change was found Confirmed by Wyvonnia Dusky  MD, Wren Pryce  [phone]) on 05/18/2013 3:24:00 PM      MDM   Final diagnoses:  CVA (cerebral infarction)  Preseptal cellulitis of left eye   bilateral eye irritation and swelling for the past 4 days. Left greater than right. associated with blurry vision, subjective fever, pain with eye movement on the left.  She has conjunctivitis with possible orbital cellulitis on the left. This morning she woke up with a headache and noticed numbness on the left side of her body. She has weakness in her arm and leg on the left side. Her last seen normal with sometime last night.  She'll need evaluation for stroke as well as orbital infection. Symptoms may represent complicated migraine. Code stroke not activated as patient was last seen normal last night (or possibly 2 nights ago).   CT head is negative. There is evidence of left preseptal cellulitis and possible abscess in upper lid. Patient started on vancomycin and Cipro given penicillin allergy.  Admission discussed with Dr. Shanon Brow hospitalist. She requests discussion with Dr. Doy Mince of neurology. Dr. Doy Mince feels patient can stay at [facility] as she is not a candidate for any stroke intervention. Her left eye infection does not seem to coincide with her left sided weakness.  I personally performed the services described in this documentation, which was scribed in my presence. The recorded information has been reviewed and is accurate.      Ezequiel Essex, MD 05/18/13 1758

## 2013-05-18 NOTE — ED Notes (Signed)
Site around pt IV increased redness since starting cipro IV. Pt denies any burning sensation. IV patent. NS 250 ml bag as Primary IV dilutent with cipro IV infusion. Pt tolerating well. Redness decreasing. No swelling noted to site.

## 2013-05-18 NOTE — ED Notes (Addendum)
Pt given a soda to drink per request and hospitalist approval. Pt tolerating well.

## 2013-05-19 ENCOUNTER — Inpatient Hospital Stay (HOSPITAL_COMMUNITY): Payer: PRIVATE HEALTH INSURANCE

## 2013-05-19 DIAGNOSIS — L0201 Cutaneous abscess of face: Principal | ICD-10-CM

## 2013-05-19 DIAGNOSIS — G43909 Migraine, unspecified, not intractable, without status migrainosus: Secondary | ICD-10-CM | POA: Diagnosis present

## 2013-05-19 DIAGNOSIS — H00039 Abscess of eyelid unspecified eye, unspecified eyelid: Secondary | ICD-10-CM | POA: Diagnosis present

## 2013-05-19 DIAGNOSIS — R51 Headache: Secondary | ICD-10-CM

## 2013-05-19 DIAGNOSIS — L03211 Cellulitis of face: Principal | ICD-10-CM

## 2013-05-19 LAB — LIPID PANEL
Cholesterol: 147 mg/dL (ref 0–200)
HDL: 57 mg/dL (ref >39)
LDL Cholesterol: 66 mg/dL (ref 0–99)
Total CHOL/HDL Ratio: 2.6 RATIO
Triglycerides: 119 mg/dL (ref <150)
VLDL: 24 mg/dL (ref 0–40)

## 2013-05-19 LAB — CBC
HCT: 39.6 % (ref 36.0–46.0)
Hemoglobin: 13.1 g/dL (ref 12.0–15.0)
MCH: 29.8 pg (ref 26.0–34.0)
MCHC: 33.1 g/dL (ref 30.0–36.0)
MCV: 90.2 fL (ref 78.0–100.0)
PLATELETS: 227 10*3/uL (ref 150–400)
RBC: 4.39 MIL/uL (ref 3.87–5.11)
RDW: 13.3 % (ref 11.5–15.5)
WBC: 4 10*3/uL (ref 4.0–10.5)

## 2013-05-19 LAB — BASIC METABOLIC PANEL
BUN: 11 mg/dL (ref 6–23)
CALCIUM: 8.7 mg/dL (ref 8.4–10.5)
CO2: 28 mEq/L (ref 19–32)
Chloride: 105 mEq/L (ref 96–112)
Creatinine, Ser: 0.66 mg/dL (ref 0.50–1.10)
GLUCOSE: 125 mg/dL — AB (ref 70–99)
Potassium: 4.2 mEq/L (ref 3.7–5.3)
Sodium: 141 mEq/L (ref 137–147)

## 2013-05-19 LAB — HEMOGLOBIN A1C
HEMOGLOBIN A1C: 5.2 % (ref <5.7)
MEAN PLASMA GLUCOSE: 103 mg/dL (ref <117)

## 2013-05-19 LAB — TSH: TSH: 1.51 u[IU]/mL (ref 0.350–4.500)

## 2013-05-19 MED ORDER — KETOROLAC TROMETHAMINE 0.5 % OP SOLN
1.0000 [drp] | Freq: Four times a day (QID) | OPHTHALMIC | Status: DC
  Administered 2013-05-19 – 2013-05-22 (×12): 1 [drp] via OPHTHALMIC
  Filled 2013-05-19: qty 3

## 2013-05-19 MED ORDER — GADOBENATE DIMEGLUMINE 529 MG/ML IV SOLN
19.0000 mL | Freq: Once | INTRAVENOUS | Status: AC | PRN
  Administered 2013-05-19: 19 mL via INTRAVENOUS

## 2013-05-19 NOTE — Care Management Note (Signed)
UR completed 

## 2013-05-19 NOTE — Progress Notes (Signed)
[facility] PROGRESS NOTE  [name] ZOX:[id number] DOB: 16-Feb-1966 DOA: 05/18/2013 PCP: Odette Fraction, MD  Assessment/Plan: Principal Problem:  Periorbital cellulitis of left eye- no improvement this am.  Cont eye drops and continue  iv vanc and cipro day #2. Some increased pain/pressure with eye movement. Pain meds and warm compresses  Active Problems:  Left-sided weakness/Numbness and tingling of left arm and leg -unsure if complicated migraine or cva. Ct does not show any intracranial absess. Await  Mri and mrv.  Continue aspirin. Will obtain lipid panel and A1c.   Abscess eyelid:per orbital CT left lateral periorbital cellulitis with a developing abscess in the left upper lid. No evidence for postseptal invasion. Antibiotics as above. She is afebrile and non-toxic appearing. Monitor closely.  Headache: describes as pressure around left eye. Likely related to #1. MRI/MRV pending. Pain meds. monitor  Hypertension: not on home medications: somewhat soft. Will monitor  Anxiety: appears stable at baseline.    Code Status: full Family Communication: none present Disposition Plan: home when ready   Consultants:  none  Procedures:  none  Antibiotics:  cipro 05/19/13>>  Vancomycin 05/19/13>>  HPI/Subjective: Awake alert. Reports no improvement in pain/swelling of left eye/face. Reports some improvement in left sided weakness but still with numbness/tingling  Objective: Filed Vitals:   05/19/13 0647  BP: 93/61  Pulse: 61  Temp: 97.8 F (36.6 C)  Resp: 18   No intake or output data in the 24 hours ending 05/19/13 0921 Filed Weights   05/18/13 1421 05/18/13 1910 05/19/13 0647  Weight: 86.183 kg (190 lb) 89.449 kg (197 lb 3.2 oz) 90.674 kg (199 lb 14.4 oz)    Exam:   General:  Obese somewhat ill appearing NAD  Cardiovascular: RRR NO MGR no LE edema  Respiratory: normal effort BS clear bilaterally no wheeze  Abdomen: obese soft +BS non-tender to  palpation  Musculoskeletal: good muscle tone. No clubbing cyanosis  Neuro: cranial nerve II-XII intact. Speech clear. MOE UE strength 4/5 on left 5/5 on right. LE strength 5/5 biaterally  HEENT: left eye with swelling almost closed, greenish/yellow drainage small amount. Swelling left cheek to jawline. Erythema left eye to cheek with some warmth. Left side of face tender to touch. Right eye with mild swelling and scant drainage. Bilateral EOMI with pain on left.    Data Reviewed: Basic Metabolic Panel:  Recent Labs Lab 05/18/13 1532 05/19/13 0625  NA 139 141  K 4.0 4.2  CL 100 105  CO2 27 28  GLUCOSE 82 125*  BUN 12 11  CREATININE 0.55 0.66  CALCIUM 9.4 8.7   Liver Function Tests:  Recent Labs Lab 05/18/13 1532  AST 43*  ALT 63*  ALKPHOS 175*  BILITOT 0.3  PROT 7.7  ALBUMIN 3.7   No results found for this basename: LIPASE, AMYLASE,  in the last 168 hours No results found for this basename: AMMONIA,  in the last 168 hours CBC:  Recent Labs Lab 05/18/13 1532 05/19/13 0625  WBC 4.4 4.0  NEUTROABS 2.4  --   HGB 13.9 13.1  HCT 41.6 39.6  MCV 87.8 90.2  PLT 236 227   Cardiac Enzymes:  Recent Labs Lab 05/18/13 1532  TROPONINI <0.30   BNP (last 3 results) No results found for this basename: PROBNP,  in the last 8760 hours CBG:  Recent Labs Lab 05/18/13 1515  GLUCAP 74    No results found for this or any previous visit (from the past 240 hour(s)).   Studies:  Ct Head Wo Contrast  05/18/2013   CLINICAL DATA:  Severe headache and eye pressure.  EXAM: CT HEAD WITHOUT CONTRAST  TECHNIQUE: Contiguous axial images were obtained from the base of the skull through the vertex without intravenous contrast.  COMPARISON:  CT HEAD W/O CM dated 03/01/2004; CT ORBITS W/CM dated 05/18/2013  FINDINGS: Sinuses/Soft tissues: Left periorbital soft tissue swelling will be better evaluated on contrast-enhanced dedicated CT. Right-sided mastoid effusion is chronic  Intracranial:  No mass lesion, hemorrhage, hydrocephalus, acute infarct, intra-axial, or extra-axial fluid collection.  IMPRESSION: 1.  No acute intracranial abnormality. 2. Left periorbital soft tissue swelling, better evaluated on dedicated orbit CT. Please see that report. 3. Chronic right-sided mastoid effusion.   Electronically Signed   By: Abigail Miyamoto M.D.   On: 05/18/2013 15:30   Ct Orbits W/cm  05/18/2013   CLINICAL DATA:  Left orbital cellulitis. Overall draining and swelling for 5 days.  EXAM: CT ORBITS WITH CONTRAST  TECHNIQUE: Multidetector CT imaging of the orbits was performed following the bolus administration of intravenous contrast.  CONTRAST:  1mL OMNIPAQUE IOHEXOL 300 MG/ML  SOLN  COMPARISON:  CT head from the same day.  FINDINGS: Limited imaging of the postcontrast brain is unremarkable. And and a periorbital soft tissue swelling and is evident within the eyelids and along the lateral margin of the orbit. The globe is intact. There may be a small abscess within the left upper lid laterally measuring 4 x 14 mm  There is no evidence for postseptal disease. The extraocular muscles are intact. The cavernous sinus is within normal limits bilaterally. The visualized paranasal sinuses are clear.  IMPRESSION: 1. Left lateral periorbital cellulitis with a developing abscess in the left upper lid. 2. No evidence for postseptal invasion.   Electronically Signed   By: Lawrence Santiago M.D.   On: 05/18/2013 15:29    Scheduled Meds: . aspirin EC  81 mg Oral Daily  . ciprofloxacin  400 mg Intravenous Q12H  . enoxaparin (LOVENOX) injection  40 mg Subcutaneous Q24H  . escitalopram  10 mg Oral Daily  . gatifloxacin  1 drop Both Eyes QID  . sodium chloride  3 mL Intravenous Q12H  . sodium chloride  3 mL Intravenous Q12H  . trimethoprim-polymyxin b  1 drop Left Eye QID  . vancomycin  1,000 mg Intravenous Q12H   Continuous Infusions:   Principal Problem:   Periorbital cellulitis of left eye Active Problems:    Hypertension   Anxiety   Left-sided weakness   Numbness and tingling of left arm and leg   CVA (cerebral infarction)   Abscess, eyelid   Headache(784.0)    Time spent: 35 minutes    [facility] Pager [phone]. If 7PM-7AM, please contact night-coverage at [email or web address], password [facility] 05/19/2013, 9:21 AM  LOS: 1 day

## 2013-05-19 NOTE — Progress Notes (Signed)
Patient seen and examined. Above note reviewed.  She's been admitted to the hospital with bilateral conjunctivitis which has progressed to left periorbital cellulitis. Initial imaging done in the emergency room including CT scan of the orbits did not indicate any post septal involvement. Interestingly, the patient does describe having a headache as well as left-sided weakness in her upper and lower extremities.  Regarding her periorbital cellulitis, with her associated neurologic symptoms, and there was a concern for underlying cerebral venous thrombosis. Fortunately MRV of the brain did not indicate any venous thrombosis. I discussed the case with Dr. Nehemiah Massed on-call for ophthalmology at [facility]. At this time, he recommends continued IV antibiotic therapy. He reports that with conjunctivitis, a certain degree of blurry vision would be expected. This can also cause some degree of pain with movement of the eye. Reassuringly, it does not appear that her infection is post septal. He did recommend ketorolac eyedrops for symptomatic relief. He did not recommend any antibiotic eye drops since her conjunctivitis is likely viral in origin. He also recommended close followup with ophthalmology later this week after discharge. I did discuss the possible developing abscess in her left upper lid. He did not feel that this requires surgery at this time and recommended to continue IV antibiotics. Continue to monitor for clinical improvement.    regarding her left-sided weakness in upper and lower extremities. It is unclear whether this is related to a complex migraine. Her neuro imaging did not indicate any acute infarct. Exam is somewhat inconsistent since she appears to have 3/5 strength in the left lower extremity, but is able to ambulate to the same. Grip strength is markedly decreased on the left hand, but she does not appear to have any pronator drift. We'll request a neurology consultation for further  workup.  [name]

## 2013-05-20 ENCOUNTER — Telehealth: Payer: Self-pay | Admitting: *Deleted

## 2013-05-20 DIAGNOSIS — H109 Unspecified conjunctivitis: Secondary | ICD-10-CM | POA: Diagnosis present

## 2013-05-20 LAB — CBC
HCT: 35.6 % — ABNORMAL LOW (ref 36.0–46.0)
Hemoglobin: 11.6 g/dL — ABNORMAL LOW (ref 12.0–15.0)
MCH: 29.1 pg (ref 26.0–34.0)
MCHC: 32.6 g/dL (ref 30.0–36.0)
MCV: 89.2 fL (ref 78.0–100.0)
Platelets: 210 10*3/uL (ref 150–400)
RBC: 3.99 MIL/uL (ref 3.87–5.11)
RDW: 13 % (ref 11.5–15.5)
WBC: 4.6 10*3/uL (ref 4.0–10.5)

## 2013-05-20 LAB — VANCOMYCIN, TROUGH: VANCOMYCIN TR: 10.1 ug/mL (ref 10.0–20.0)

## 2013-05-20 NOTE — Telephone Encounter (Signed)
Message copied by Sheral Flow on Tue May 20, 2013 11:52 AM ------      Message from: Lenore Manner      Created: Tue May 20, 2013 11:20 AM      Contact: [phone]       Pt is wanting to speak to Dr Buelah Manis about what is going on with her. She wouldn't give me any more details other than that  ------

## 2013-05-20 NOTE — Care Management Note (Unsigned)
    Page 1 of 1   05/20/2013     3:55:39 PM   CARE MANAGEMENT NOTE 05/20/2013  Patient:  [name], [name]   Account Number:  [id number]  Date Initiated:  05/20/2013  Documentation initiated by:  Claretha Cooper  Subjective/Objective Assessment:   Pt admitted from home where she lives with her child. Daughter lives nearby. Sister is OR Therapist, sports. CM spoke to pt about the possibility of home IVAB. Will address again tomorrow.     Action/Plan:   Anticipated DC Date:     Anticipated DC Plan:  [place]  CM consult      Choice offered to / List presented to:          [facility] arranged  HH-1 RN      Status of service:  In process, will continue to follow Medicare Important Message given?   (If response is "NO", the following Medicare IM given date fields will be blank) Date Medicare IM given:   Date Additional Medicare IM given:    Discharge Disposition:    Per UR Regulation:    If discussed at Long Length of Stay Meetings, dates discussed:    Comments:  05/20/13 Claretha Cooper RN BSN CM

## 2013-05-20 NOTE — Progress Notes (Signed)
[facility] PROGRESS NOTE  [name] [id number] DOB: January 09, 1966 DOA: 05/18/2013 PCP: Odette Fraction, MD  Assessment/Plan: Principal Problem:  Periorbital cellulitis of left eye- related to bilateral conjunctivitis. CT scan of orbits did not indicate any post septal involvement. Case discussed with Dr. Nehemiah Massed who recommended IV antibiotics.  Edema and erythema of left side face improved. Worsening ecchymosis of bilateral eyes. Continue iv vanc and cipro day #3. Continues with  pain/pressure with eye movement. Pain meds and warm compresses effective Active Problems:  Conjunctivitis- bilateral. Initially on drops. These discontinued 05/19/13. See above. Will follow up with Dr. Nehemiah Massed at discharge.   Left-sided weakness/Numbness and tingling of left arm and leg - slight improvement this am. Etiology unclear. Ct does not show any intracranial absess. MRI/MRV no acute infarct and no venous thrombosis. Await neuro recommendations. Continue aspirin. Lipid panel and A1c within the limits of normal.   Abscess eyelid:per orbital CT left lateral periorbital cellulitis with a developing abscess in the left upper lid. No evidence for postseptal invasion. Antibiotics as above. She is afebrile and non-toxic appearing. Monitor closely.   Headache: describes as pressure around left eye. Likely related to #1. MRI/MRV negative infarct/abscess/thrombosis.  Pain meds. monitor   Hypertension: not on home medications: somewhat soft. Will monitor  Anxiety: appears stable at baseline.    Code Status: full Family Communication: none present Disposition Plan: home when ready   Consultants:  neurology  Procedures:  none  Antibiotics: cipro 05/19/13>>  Vancomycin 05/19/13>>     HPI/Subjective: Sitting up in bed eating breakfast. Reports some improvement pain.   Objective: Filed Vitals:   05/20/13 0435  BP: 106/62  Pulse: 67  Temp: 98.3 F (36.8 C)  Resp: 20    Intake/Output  Summary (Last 24 hours) at 05/20/13 0953 Last data filed at 05/20/13 0330  Gross per 24 hour  Intake    600 ml  Output      2 ml  Net    598 ml   Filed Weights   05/18/13 1421 05/18/13 1910 05/19/13 0647  Weight: 86.183 kg (190 lb) 89.449 kg (197 lb 3.2 oz) 90.674 kg (199 lb 14.4 oz)    Exam:   General:  Calm appears comfortable  Cardiovascular: RRR No MGR No LE edema  Respiratory: normal effort BSCTAB  Abdomen: round soft non-distended +BS non-tender  Musculoskeletal: good muscle tone  HEENT :bilateral eye ecchymosis L>R. Moderate amount thick yellowish drainage left eye.  Edema to left side of face improved.   Data Reviewed: Basic Metabolic Panel:  Recent Labs Lab 05/18/13 1532 05/19/13 0625  NA 139 141  K 4.0 4.2  CL 100 105  CO2 27 28  GLUCOSE 82 125*  BUN 12 11  CREATININE 0.55 0.66  CALCIUM 9.4 8.7   Liver Function Tests:  Recent Labs Lab 05/18/13 1532  AST 43*  ALT 63*  ALKPHOS 175*  BILITOT 0.3  PROT 7.7  ALBUMIN 3.7   No results found for this basename: LIPASE, AMYLASE,  in the last 168 hours No results found for this basename: AMMONIA,  in the last 168 hours CBC:  Recent Labs Lab 05/18/13 1532 05/19/13 0625 05/20/13 0554  WBC 4.4 4.0 4.6  NEUTROABS 2.4  --   --   HGB 13.9 13.1 11.6*  HCT 41.6 39.6 35.6*  MCV 87.8 90.2 89.2  PLT 236 227 210   Cardiac Enzymes:  Recent Labs Lab 05/18/13 1532  TROPONINI <0.30   BNP (last 3 results) No results found  for this basename: PROBNP,  in the last 8760 hours CBG:  Recent Labs Lab 05/18/13 1515  GLUCAP 74    No results found for this or any previous visit (from the past 240 hour(s)).   Studies: Ct Head Wo Contrast  05/18/2013   CLINICAL DATA:  Severe headache and eye pressure.  EXAM: CT HEAD WITHOUT CONTRAST  TECHNIQUE: Contiguous axial images were obtained from the base of the skull through the vertex without intravenous contrast.  COMPARISON:  CT HEAD W/O CM dated 03/01/2004; CT  ORBITS W/CM dated 05/18/2013  FINDINGS: Sinuses/Soft tissues: Left periorbital soft tissue swelling will be better evaluated on contrast-enhanced dedicated CT. Right-sided mastoid effusion is chronic  Intracranial: No mass lesion, hemorrhage, hydrocephalus, acute infarct, intra-axial, or extra-axial fluid collection.  IMPRESSION: 1.  No acute intracranial abnormality. 2. Left periorbital soft tissue swelling, better evaluated on dedicated orbit CT. Please see that report. 3. Chronic right-sided mastoid effusion.   Electronically Signed   By: Abigail Miyamoto M.D.   On: 05/18/2013 15:30   Mr Jeri Cos IH Contrast  05/19/2013   CLINICAL DATA:  Left scrotal cellulitis with headache and left-sided weakness. Evaluate for cerebral venous thrombosis.  EXAM: MRI HEAD WITHOUT AND WITH CONTRAST  MRV HEAD WITHOUT CONTRAST  TECHNIQUE: Multiplanar, multiecho pulse sequences of the brain and surrounding structures were obtained without and with intravenous contrast. Angiographic images of the head were obtained using MRA technique without contrast.  CONTRAST:  44mL MULTIHANCE GADOBENATE DIMEGLUMINE 529 MG/ML IV SOLN  COMPARISON:  Previous brain MRI not available (2004). Head CT from yesterday.  FINDINGS: MRI HEAD FINDINGS  Calvarium and upper cervical spine: No marrow signal abnormality.  Orbits: Edema and asymmetric enhancement around the left preseptal globe. 4 mm area of hypoenhancement which could represent small residual collection or trapped gas. This is smaller than previously seen. The imaged facial vein and the superior ophthalmic vein are patent. There is no asymmetric enhancement in the cavernous sinuses.  Sinuses: Enlargement of the nasopharyngeal lymphoid tissue, likely reactive given the clinical circumstances. There is T2 hyperintense opacification of the right mastoid air cells in middle ear which is chronic, dating back to 2005 at least.  Brain: No acute abnormality such as acute infarct, hemorrhage, hydrocephalus,  or mass lesion. No evidence of large vessel occlusion. No abnormal intracranial enhancement. There is subtle sulcal high intensity on FLAIR imaging which is likely normal pial vessels.  MRV HEAD FINDINGS  No evidence for dural venous sinus, deep venous thrombosis or cortical vein thrombosis. 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 The cavernous sinuses are symmetric on conventional postcontrast imaging.  IMPRESSION: 1. Negative for intracranial venous thrombosis or intracranial abscess. 2. Subtle signal in the sulci which is likely normal pial vessels. If there is clinical concern for meningitis, recommend CSF analysis. 3. Left periorbital cellulitis. No postseptal involvement. There is a 4 mm area of hypoenhancement in the left orbital midline which could represent tiny abscess or trapped gas.   Electronically Signed   By: Jorje Guild M.D.   On: 05/19/2013 11:26   Mr Mrv Head Wo Cm  05/19/2013   CLINICAL DATA:  Left scrotal cellulitis with headache and left-sided weakness. Evaluate for cerebral venous thrombosis.  EXAM: MRI HEAD WITHOUT AND WITH CONTRAST  MRV HEAD WITHOUT CONTRAST  TECHNIQUE: Multiplanar, multiecho pulse sequences of the brain and surrounding structures were obtained without and with intravenous contrast. Angiographic images of the head were obtained  using MRA technique without contrast.  CONTRAST:  62mL MULTIHANCE GADOBENATE DIMEGLUMINE 529 MG/ML IV SOLN  COMPARISON:  Previous brain MRI not available (2004). Head CT from yesterday.  FINDINGS: MRI HEAD FINDINGS  Calvarium and upper cervical spine: No marrow signal abnormality.  Orbits: Edema and asymmetric enhancement around the left preseptal globe. 4 mm area of hypoenhancement which could represent small residual collection or trapped gas. This is smaller than previously seen. The imaged facial vein and the superior ophthalmic vein are patent. There is no asymmetric enhancement in  the cavernous sinuses.  Sinuses: Enlargement of the nasopharyngeal lymphoid tissue, likely reactive given the clinical circumstances. There is T2 hyperintense opacification of the right mastoid air cells in middle ear which is chronic, dating back to 2005 at least.  Brain: No acute abnormality such as acute infarct, hemorrhage, hydrocephalus, or mass lesion. No evidence of large vessel occlusion. No abnormal intracranial enhancement. There is subtle sulcal high intensity on FLAIR imaging which is likely normal pial vessels.  MRV HEAD FINDINGS  No evidence for dural venous sinus, deep venous thrombosis or cortical vein thrombosis. 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 The cavernous sinuses are symmetric on conventional postcontrast imaging.  IMPRESSION: 1. Negative for intracranial venous thrombosis or intracranial abscess. 2. Subtle signal in the sulci which is likely normal pial vessels. If there is clinical concern for meningitis, recommend CSF analysis. 3. Left periorbital cellulitis. No postseptal involvement. There is a 4 mm area of hypoenhancement in the left orbital midline which could represent tiny abscess or trapped gas.   Electronically Signed   By: Jorje Guild M.D.   On: 05/19/2013 11:26   Ct Orbits W/cm  05/18/2013   CLINICAL DATA:  Left orbital cellulitis. Overall draining and swelling for 5 days.  EXAM: CT ORBITS WITH CONTRAST  TECHNIQUE: Multidetector CT imaging of the orbits was performed following the bolus administration of intravenous contrast.  CONTRAST:  43mL OMNIPAQUE IOHEXOL 300 MG/ML  SOLN  COMPARISON:  CT head from the same day.  FINDINGS: Limited imaging of the postcontrast brain is unremarkable. And and a periorbital soft tissue swelling and is evident within the eyelids and along the lateral margin of the orbit. The globe is intact. There may be a small abscess within the left upper lid laterally measuring 4 x 14 mm   There is no evidence for postseptal disease. The extraocular muscles are intact. The cavernous sinus is within normal limits bilaterally. The visualized paranasal sinuses are clear.  IMPRESSION: 1. Left lateral periorbital cellulitis with a developing abscess in the left upper lid. 2. No evidence for postseptal invasion.   Electronically Signed   By: Lawrence Santiago M.D.   On: 05/18/2013 15:29    Scheduled Meds: . aspirin EC  81 mg Oral Daily  . ciprofloxacin  400 mg Intravenous Q12H  . enoxaparin (LOVENOX) injection  40 mg Subcutaneous Q24H  . escitalopram  10 mg Oral Daily  . ketorolac  1 drop Both Eyes QID  . sodium chloride  3 mL Intravenous Q12H  . sodium chloride  3 mL Intravenous Q12H  . vancomycin  1,000 mg Intravenous Q12H   Continuous Infusions:   Principal Problem:   Periorbital cellulitis of left eye Active Problems:   Hypertension   Anxiety   Left-sided weakness   Numbness and tingling of left arm and leg   CVA (cerebral infarction)   Abscess, eyelid   Headache(784.0)   Conjunctivitis  Time spent: 30 minutes    [facility] Pager [phone]. If 7PM-7AM, please contact night-coverage at [email or web address], password [facility] 05/20/2013, 9:53 AM  LOS: 2 days

## 2013-05-20 NOTE — Telephone Encounter (Signed)
Patient states that she was seen on Tuesday for her eye inflammation and conjunctivitis.   Reports that she is now in hospital with eye infection.   States that abscess noted to eye and MRI/CT was performed.   MRI/CT showed no abnormalities.   Dr. Merlene Laughter recommended spinal tap to determine if infection has infiltrated to spinal fluid, but she is not sure about spinal tap.   States that she is currently taking IV ABT and drops to soothe inflammation to eyes.   MD to be made aware.

## 2013-05-20 NOTE — Consult Note (Signed)
[place] A. Merlene Laughter, MD     [email or web address]          [name] is an 48 y.o. female.   ASSESSMENT/PLAN: 1. Left-sided weakness and numbness in the Setting of facial cellulitis of unclear etiology. MRI FAILED to reveal an acute infarct or intracranial process explain the left-sided weakness. There is concern that she may have CSF involvement and therefore spinal fluid assessment is recommended. This may change the treatment terms of dose and the length of of IV antibiotics. The patient is extremely reluctant to have the CSF analysis done however. In the meantime, we will recommend continuation of IV antibiotics for at least 7 days.  This is a 48 year old white female who presents with a day history of increasing facial swelling, left facial headaches or erythema of the eyes. The patient did develop a fever 101 at home once. About 5 days into the course of her symptoms, she developed weakness of the left side associated with numbness. She reports that his symptoms have not improved. She reports severe left hemicrania headache. The headache does radiate into the neck on the left side. Review of systems otherwise negative for GI symptoms, chest pain, shortness of breath or other symptoms.  GENERAL: She appears to be in discomfort but no acute distress.  HEENT: Atraumatic normocephalic. The sclera are both significantly injected especially on the left side. She complains of mild soreness on that manipulation on the left side.  ABDOMEN: soft  EXTREMITIES: No edema   BACK: Normal.  SKIN: Normal by inspection.    MENTAL STATUS: Alert and oriented. Speech, language and cognition are generally intact. Judgment and insight normal.   CRANIAL NERVES: Pupils are equal, round and reactive to light and accommodation; extra ocular movements are full, there is no significant nystagmus; visual fields are full; upper and lower facial muscles are normal in strength and  symmetric, there is no flattening of the nasolabial folds; tongue is midline; uvula is midline; shoulder elevation is normal.  MOTOR: The right side shows normal tone, bulk and strength. LUE-The Left deltoid 5, triceps 4/5 and handgrip 4/5. LUE- Hip flexion 5 and dorsiflexion 4 minus. She has a downward drift of the left upper extremity.   COORDINATION: Left finger to nose is normal, right finger to nose is normal, No rest tremor; no intention tremor; no postural tremor; no bradykinesia.  REFLEXES: Deep tendon reflexes are symmetrical and normal. Babinski reflexes are flexor bilaterally.   SENSATION: Normal to light touch.  GAIT:Not tested.  The brain MRI is reviewed in person. There is no acute lesion seen on diffusion imaging. Flare imaging and is normal. No enhancement is noted with contrast on T1 imaging.   Past Medical History  Diagnosis Date  . Depression   . Hypertension   . Anxiety   . Rosacea     Past Surgical History  Procedure Laterality Date  . Appendectomy    . Cholecystectomy    . Tonsillectomy    . Lumbar      ruptured disc in lumbar taken out  . Back surgery    . Appendectomy    . Cholecystectomy    . Abdominal hysterectomy      non cancerous    Family History  Problem Relation Age of Onset  . Diabetes Father   . Heart disease Father 10  . Hypertension Father   . Cancer Maternal Grandfather     colon cancer (70's)  . Diabetes Paternal Grandmother   .  Diabetes Paternal Grandfather   . Cancer Cousin     breast    Social History:  reports that she has never smoked. She has never used smokeless tobacco. She reports that she does not drink alcohol or use illicit drugs.  Allergies:  Allergies  Allergen Reactions  . Penicillins Hives    Medications: Prior to Admission medications   Medication Sig Start Date End Date Taking? Authorizing Provider  azithromycin (ZITHROMAX Z-PAK) 250 MG tablet Take 250-500 mg by mouth daily. Started on 05/16/13   Yes  Historical Provider, MD  escitalopram (LEXAPRO) 10 MG tablet Take 1 tablet (10 mg total) by mouth daily. 01/06/13  Yes [name], MD  moxifloxacin (VIGAMOX) 0.5 % ophthalmic solution Place 1 drop into both eyes 3 (three) times daily.   Yes Historical Provider, MD  trimethoprim-polymyxin b (POLYTRIM) ophthalmic solution Place 1 drop into the left eye 4 (four) times daily. 05/13/13  Yes Alycia Rossetti, MD    Scheduled Meds: . aspirin EC  81 mg Oral Daily  . ciprofloxacin  400 mg Intravenous Q12H  . enoxaparin (LOVENOX) injection  40 mg Subcutaneous Q24H  . escitalopram  10 mg Oral Daily  . ketorolac  1 drop Both Eyes QID  . sodium chloride  3 mL Intravenous Q12H  . sodium chloride  3 mL Intravenous Q12H  . vancomycin  1,000 mg Intravenous Q12H   Continuous Infusions:  PRN Meds:.sodium chloride, HYDROcodone-acetaminophen, ondansetron (ZOFRAN) IV, ondansetron, sodium chloride   Blood pressure 106/62, pulse 67, temperature 98.3 F (36.8 C), temperature source Oral, resp. rate 20, height '5\' 6"'  (1.676 m), weight 90.674 kg (199 lb 14.4 oz), SpO2 99.00%.   Results for orders placed during the hospital encounter of 05/18/13 (from the past 48 hour(s))  CBG MONITORING, ED     Status: None   Collection Time    05/18/13  3:15 PM      Result Value Ref Range   Glucose-Capillary 74  70 - 99 mg/dL  ETHANOL     Status: None   Collection Time    05/18/13  3:32 PM      Result Value Ref Range   Alcohol, Ethyl (B) <11  0 - 11 mg/dL   Comment:            LOWEST DETECTABLE LIMIT FOR     SERUM ALCOHOL IS 11 mg/dL     FOR MEDICAL PURPOSES ONLY  PROTIME-INR     Status: None   Collection Time    05/18/13  3:32 PM      Result Value Ref Range   Prothrombin Time 12.6  11.6 - 15.2 seconds   INR 0.96  0.00 - 1.49  APTT     Status: None   Collection Time    05/18/13  3:32 PM      Result Value Ref Range   aPTT 32  24 - 37 seconds  CBC     Status: None   Collection Time    05/18/13  3:32 PM       Result Value Ref Range   WBC 4.4  4.0 - 10.5 K/uL   RBC 4.74  3.87 - 5.11 MIL/uL   Hemoglobin 13.9  12.0 - 15.0 g/dL   HCT 41.6  36.0 - 46.0 %   MCV 87.8  78.0 - 100.0 fL   MCH 29.3  26.0 - 34.0 pg   MCHC 33.4  30.0 - 36.0 g/dL   RDW 13.0  11.5 - 15.5 %  Platelets 236  150 - 400 K/uL  DIFFERENTIAL     Status: None   Collection Time    05/18/13  3:32 PM      Result Value Ref Range   Neutrophils Relative % 55  43 - 77 %   Neutro Abs 2.4  1.7 - 7.7 K/uL   Lymphocytes Relative 32  12 - 46 %   Lymphs Abs 1.4  0.7 - 4.0 K/uL   Monocytes Relative 11  3 - 12 %   Monocytes Absolute 0.5  0.1 - 1.0 K/uL   Eosinophils Relative 2  0 - 5 %   Eosinophils Absolute 0.1  0.0 - 0.7 K/uL   Basophils Relative 0  0 - 1 %   Basophils Absolute 0.0  0.0 - 0.1 K/uL  COMPREHENSIVE METABOLIC PANEL     Status: Abnormal   Collection Time    05/18/13  3:32 PM      Result Value Ref Range   Sodium 139  137 - 147 mEq/L   Potassium 4.0  3.7 - 5.3 mEq/L   Chloride 100  96 - 112 mEq/L   CO2 27  19 - 32 mEq/L   Glucose, Bld 82  70 - 99 mg/dL   BUN 12  6 - 23 mg/dL   Creatinine, Ser 0.55  0.50 - 1.10 mg/dL   Calcium 9.4  8.4 - 10.5 mg/dL   Total Protein 7.7  6.0 - 8.3 g/dL   Albumin 3.7  3.5 - 5.2 g/dL   AST 43 (*) 0 - 37 U/L   ALT 63 (*) 0 - 35 U/L   Alkaline Phosphatase 175 (*) 39 - 117 U/L   Total Bilirubin 0.3  0.3 - 1.2 mg/dL   GFR calc non Af Amer >90  >90 mL/min   GFR calc Af Amer >90  >90 mL/min   Comment: (NOTE)     The eGFR has been calculated using the CKD EPI equation.     This calculation has not been validated in all clinical situations.     eGFR's persistently <90 mL/min signify possible Chronic Kidney     Disease.  TROPONIN I     Status: None   Collection Time    05/18/13  3:32 PM      Result Value Ref Range   Troponin I <0.30  <0.30 ng/mL   Comment:            Due to the release kinetics of cTnI,     a negative result within the first hours     of the onset of symptoms does not  rule out     myocardial infarction with certainty.     If myocardial infarction is still suspected,     repeat the test at appropriate intervals.  TSH     Status: None   Collection Time    05/18/13  3:53 PM      Result Value Ref Range   TSH 1.510  0.350 - 4.500 uIU/mL   Comment: Performed at [facility] ([place]     Status: Abnormal   Collection Time    05/18/13  4:00 PM      Result Value Ref Range   Opiates POSITIVE (*) NONE DETECTED   Cocaine NONE DETECTED  NONE DETECTED   Benzodiazepines NONE DETECTED  NONE DETECTED   Amphetamines NONE DETECTED  NONE DETECTED   Tetrahydrocannabinol NONE DETECTED  NONE DETECTED   Barbiturates NONE DETECTED  NONE DETECTED   Comment:            DRUG SCREEN FOR MEDICAL PURPOSES     ONLY.  IF CONFIRMATION IS NEEDED     FOR ANY PURPOSE, NOTIFY LAB     WITHIN 5 DAYS.                LOWEST DETECTABLE LIMITS     FOR URINE DRUG SCREEN     Drug Class       Cutoff (ng/mL)     Amphetamine      1000     Barbiturate      200     Benzodiazepine   037     Tricyclics       048     Opiates          300     Cocaine          300     THC              50  URINALYSIS, ROUTINE W REFLEX MICROSCOPIC     Status: Abnormal   Collection Time    05/18/13  4:00 PM      Result Value Ref Range   Color, Urine YELLOW  YELLOW   APPearance CLEAR  CLEAR   Specific Gravity, Urine <1.005 (*) 1.005 - 1.030   pH 6.5  5.0 - 8.0   Glucose, UA NEGATIVE  NEGATIVE mg/dL   Hgb urine dipstick TRACE (*) NEGATIVE   Bilirubin Urine NEGATIVE  NEGATIVE   Ketones, ur NEGATIVE  NEGATIVE mg/dL   Protein, ur NEGATIVE  NEGATIVE mg/dL   Urobilinogen, UA 0.2  0.0 - 1.0 mg/dL   Nitrite NEGATIVE  NEGATIVE   Leukocytes, UA NEGATIVE  NEGATIVE  PREGNANCY, URINE     Status: None   Collection Time    05/18/13  4:00 PM      Result Value Ref Range   Preg Test, Ur NEGATIVE  NEGATIVE   Comment:            THE SENSITIVITY OF THIS     METHODOLOGY IS >20  mIU/mL.  URINE MICROSCOPIC-ADD ON     Status: None   Collection Time    05/18/13  4:00 PM      Result Value Ref Range   RBC / HPF 0-2  <3 RBC/hpf  BASIC METABOLIC PANEL     Status: Abnormal   Collection Time    05/19/13  6:25 AM      Result Value Ref Range   Sodium 141  137 - 147 mEq/L   Potassium 4.2  3.7 - 5.3 mEq/L   Chloride 105  96 - 112 mEq/L   CO2 28  19 - 32 mEq/L   Glucose, Bld 125 (*) 70 - 99 mg/dL   BUN 11  6 - 23 mg/dL   Creatinine, Ser 0.66  0.50 - 1.10 mg/dL   Calcium 8.7  8.4 - 10.5 mg/dL   GFR calc non Af Amer >90  >90 mL/min   GFR calc Af Amer >90  >90 mL/min   Comment: (NOTE)     The eGFR has been calculated using the CKD EPI equation.     This calculation has not been validated in all clinical situations.     eGFR's persistently <90 mL/min signify possible Chronic Kidney     Disease.  CBC     Status: None   Collection Time    05/19/13  6:25 AM  Result Value Ref Range   WBC 4.0  4.0 - 10.5 K/uL   RBC 4.39  3.87 - 5.11 MIL/uL   Hemoglobin 13.1  12.0 - 15.0 g/dL   HCT 39.6  36.0 - 46.0 %   MCV 90.2  78.0 - 100.0 fL   MCH 29.8  26.0 - 34.0 pg   MCHC 33.1  30.0 - 36.0 g/dL   RDW 13.3  11.5 - 15.5 %   Platelets 227  150 - 400 K/uL  LIPID PANEL     Status: None   Collection Time    05/19/13  9:27 AM      Result Value Ref Range   Cholesterol 147  0 - 200 mg/dL   Triglycerides 119  <150 mg/dL   HDL 57  >39 mg/dL   Total CHOL/HDL Ratio 2.6     VLDL 24  0 - 40 mg/dL   LDL Cholesterol 66  0 - 99 mg/dL   Comment:            Total Cholesterol/HDL:CHD Risk     Coronary Heart Disease Risk Table                         Men   Women      1/2 Average Risk   3.4   3.3      Average Risk       5.0   4.4      2 X Average Risk   9.6   7.1      3 X Average Risk  23.4   11.0                Use the calculated Patient Ratio     above and the CHD Risk Table     to determine the patient's CHD Risk.                ATP III CLASSIFICATION (LDL):      <100     mg/dL    Optimal      100-129  mg/dL   Near or Above                        Optimal      130-159  mg/dL   Borderline      160-189  mg/dL   High      >190     mg/dL   Very High  HEMOGLOBIN A1C     Status: None   Collection Time    05/19/13  9:27 AM      Result Value Ref Range   Hemoglobin A1C 5.2  <5.7 %   Comment: (NOTE)                                                                               According to the ADA Clinical Practice Recommendations for 2011, when     HbA1c is used as a screening test:      >=6.5%   Diagnostic of Diabetes Mellitus               (if abnormal result is confirmed)  5.7-6.4%   Increased risk of developing Diabetes Mellitus     References:Diagnosis and Classification of Diabetes Mellitus,Diabetes     IFOY,7741,28(NOMVE 1):S62-S69 and Standards of Medical Care in             Diabetes - 2011,Diabetes HMCN,4709,62 (Suppl 1):S11-S61.   Mean Plasma Glucose 103  <117 mg/dL   Comment: Performed at [facility]  CBC     Status: Abnormal   Collection Time    05/20/13  5:54 AM      Result Value Ref Range   WBC 4.6  4.0 - 10.5 K/uL   RBC 3.99  3.87 - 5.11 MIL/uL   Hemoglobin 11.6 (*) 12.0 - 15.0 g/dL   HCT 35.6 (*) 36.0 - 46.0 %   MCV 89.2  78.0 - 100.0 fL   MCH 29.1  26.0 - 34.0 pg   MCHC 32.6  30.0 - 36.0 g/dL   RDW 13.0  11.5 - 15.5 %   Platelets 210  150 - 400 K/uL    Ct Head Wo Contrast  05/18/2013   CLINICAL DATA:  Severe headache and eye pressure.  EXAM: CT HEAD WITHOUT CONTRAST  TECHNIQUE: Contiguous axial images were obtained from the base of the skull through the vertex without intravenous contrast.  COMPARISON:  CT HEAD W/O CM dated 03/01/2004; CT ORBITS W/CM dated 05/18/2013  FINDINGS: Sinuses/Soft tissues: Left periorbital soft tissue swelling will be better evaluated on contrast-enhanced dedicated CT. Right-sided mastoid effusion is chronic  Intracranial: No mass lesion, hemorrhage, hydrocephalus, acute infarct, intra-axial, or extra-axial  fluid collection.  IMPRESSION: 1.  No acute intracranial abnormality. 2. Left periorbital soft tissue swelling, better evaluated on dedicated orbit CT. Please see that report. 3. Chronic right-sided mastoid effusion.   Electronically Signed   By: Abigail Miyamoto M.D.   On: 05/18/2013 15:30   Mr Jeri Cos EZ Contrast  05/19/2013   CLINICAL DATA:  Left scrotal cellulitis with headache and left-sided weakness. Evaluate for cerebral venous thrombosis.  EXAM: MRI HEAD WITHOUT AND WITH CONTRAST  MRV HEAD WITHOUT CONTRAST  TECHNIQUE: Multiplanar, multiecho pulse sequences of the brain and surrounding structures were obtained without and with intravenous contrast. Angiographic images of the head were obtained using MRA technique without contrast.  CONTRAST:  60m MULTIHANCE GADOBENATE DIMEGLUMINE 529 MG/ML IV SOLN  COMPARISON:  Previous brain MRI not available (2004). Head CT from yesterday.  FINDINGS: MRI HEAD FINDINGS  Calvarium and upper cervical spine: No marrow signal abnormality.  Orbits: Edema and asymmetric enhancement around the left preseptal globe. 4 mm area of hypoenhancement which could represent small residual collection or trapped gas. This is smaller than previously seen. The imaged facial vein and the superior ophthalmic vein are patent. There is no asymmetric enhancement in the cavernous sinuses.  Sinuses: Enlargement of the nasopharyngeal lymphoid tissue, likely reactive given the clinical circumstances. There is T2 hyperintense opacification of the right mastoid air cells in middle ear which is chronic, dating back to 2005 at least.  Brain: No acute abnormality such as acute infarct, hemorrhage, hydrocephalus, or mass lesion. No evidence of large vessel occlusion. No abnormal intracranial enhancement. There is subtle sulcal high intensity on FLAIR imaging which is likely normal pial vessels.  MRV HEAD FINDINGS  No evidence for dural venous sinus, deep venous thrombosis or cortical vein thrombosis. The left  transverse sinus is hypoplastic, as confirmed on postcontrast axial imaging of the brain. The cavernous sinuses are symmetric on conventional postcontrast imaging.  IMPRESSION: 1. Negative for  intracranial venous thrombosis or intracranial abscess. 2. Subtle signal in the sulci which is likely normal pial vessels. If there is clinical concern for meningitis, recommend CSF analysis. 3. Left periorbital cellulitis. No postseptal involvement. There is a 4 mm area of hypoenhancement in the left orbital midline which could represent tiny abscess or trapped gas.   Electronically Signed   By: Jorje Guild M.D.   On: 05/19/2013 11:26   Mr Mrv Head Wo Cm  05/19/2013   CLINICAL DATA:  Left scrotal cellulitis with headache and left-sided weakness. Evaluate for cerebral venous thrombosis.  EXAM: MRI HEAD WITHOUT AND WITH CONTRAST  MRV HEAD WITHOUT CONTRAST  TECHNIQUE: Multiplanar, multiecho pulse sequences of the brain and surrounding structures were obtained without and with intravenous contrast. Angiographic images of the head were obtained using MRA technique without contrast.  CONTRAST:  85m MULTIHANCE GADOBENATE DIMEGLUMINE 529 MG/ML IV SOLN  COMPARISON:  Previous brain MRI not available (2004). Head CT from yesterday.  FINDINGS: MRI HEAD FINDINGS  Calvarium and upper cervical spine: No marrow signal abnormality.  Orbits: Edema and asymmetric enhancement around the left preseptal globe. 4 mm area of hypoenhancement which could represent small residual collection or trapped gas. This is smaller than previously seen. The imaged facial vein and the superior ophthalmic vein are patent. There is no asymmetric enhancement in the cavernous sinuses.  Sinuses: Enlargement of the nasopharyngeal lymphoid tissue, likely reactive given the clinical circumstances. There is T2 hyperintense opacification of the right mastoid air cells in middle ear which is chronic, dating back to 2005 at least.  Brain: No acute abnormality such as  acute infarct, hemorrhage, hydrocephalus, or mass lesion. No evidence of large vessel occlusion. No abnormal intracranial enhancement. There is subtle sulcal high intensity on FLAIR imaging which is likely normal pial vessels.  MRV HEAD FINDINGS  No evidence for dural venous sinus, deep venous thrombosis or cortical vein thrombosis. 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 The cavernous sinuses are symmetric on conventional postcontrast imaging.  IMPRESSION: 1. Negative for intracranial venous thrombosis or intracranial abscess. 2. Subtle signal in the sulci which is likely normal pial vessels. If there is clinical concern for meningitis, recommend CSF analysis. 3. Left periorbital cellulitis. No postseptal involvement. There is a 4 mm area of hypoenhancement in the left orbital midline which could represent tiny abscess or trapped gas.   Electronically Signed   By: JJorje GuildM.D.   On: 05/19/2013 11:26   Ct Orbits W/cm  05/18/2013   CLINICAL DATA:  Left orbital cellulitis. Overall draining and swelling for 5 days.  EXAM: CT ORBITS WITH CONTRAST  TECHNIQUE: Multidetector CT imaging of the orbits was performed following the bolus administration of intravenous contrast.  CONTRAST:  867mOMNIPAQUE IOHEXOL 300 MG/ML  SOLN  COMPARISON:  CT head from the same day.  FINDINGS: Limited imaging of the postcontrast brain is unremarkable. And and a periorbital soft tissue swelling and is evident within the eyelids and along the lateral margin of the orbit. The globe is intact. There may be a small abscess within the left upper lid laterally measuring 4 x 14 mm  There is no evidence for postseptal disease. The extraocular muscles are intact. The cavernous sinus is within normal limits bilaterally. The visualized paranasal sinuses are clear.  IMPRESSION: 1. Left lateral periorbital cellulitis with a developing abscess in the left upper lid. 2. No evidence for  postseptal invasion.   Electronically Signed  By: Lawrence Santiago M.D.   On: 05/18/2013 15:29        Thinh Cuccaro A. Merlene Laughter, M.D.  Diplomate, Tax adviser of Psychiatry and Neurology ( Neurology). 05/20/2013, 9:42 AM

## 2013-05-20 NOTE — Progress Notes (Signed)
Patient seen and examined. Above note reviewed.  Her periorbital cellulitis appears to be improving. Erythema and edema around left eye has improved as compared to yesterday. She is continued erythema and her conjunctivae bilaterally, chemosis in her left eye appears to be some improving from yesterday. Ketorolac eyedrops were recommended for conjunctivitis. At this time, and I think she needs continued IV antibiotics. When she is released from the hospital, she will need followup within one week with ophthalmology, Dr. Nehemiah Massed. Neurology is following the patient and recommends lumbar puncture to rule out any CSF involvement. The patient will follow up with neurology tomorrow regarding her wishes on pursuing lumbar puncture. Continue current antibiotics for now. Anticipate discharge home in the next few days.  MEMON,JEHANZEB

## 2013-05-20 NOTE — Progress Notes (Signed)
ANTIBIOTIC CONSULT NOTE  Pharmacy Consult for Vancomycin Indication: Cellulitis  Allergies  Allergen Reactions  . Penicillins Hives    Patient Measurements: Height: 5\' 6"  (167.6 cm) Weight: 199 lb 14.4 oz (90.674 kg) IBW/kg (Calculated) : 59.3  Vital Signs: Temp: 98.3 F (36.8 C) (03/17 0435) Temp src: Oral (03/17 0435) BP: 106/62 mmHg (03/17 0435) Pulse Rate: 67 (03/17 0435) Intake/Output from previous day: 03/16 0701 - 03/17 0700 In: 600 [P.O.:600] Out: 2 [Urine:2] Intake/Output from this shift: Total I/O In: 480 [P.O.:480] Out: -   Labs:  Recent Labs  05/18/13 1532 05/19/13 0625 05/20/13 0554  WBC 4.4 4.0 4.6  HGB 13.9 13.1 11.6*  PLT 236 227 210  CREATININE 0.55 0.66  --    Estimated Creatinine Clearance: 100.8 ml/min (by C-G formula based on Cr of 0.66).  Recent Labs  05/20/13 1458  VANCOTROUGH 10.1     Microbiology: No results found for this or any previous visit (from the past 720 hour(s)).  Medical History: Past Medical History  Diagnosis Date  . Depression   . Hypertension   . Anxiety   . Rosacea    Medications:  Prescriptions prior to admission  Medication Sig Dispense Refill  . azithromycin (ZITHROMAX Z-PAK) 250 MG tablet Take 250-500 mg by mouth daily. Started on 05/16/13      . escitalopram (LEXAPRO) 10 MG tablet Take 1 tablet (10 mg total) by mouth daily.  30 tablet  3  . moxifloxacin (VIGAMOX) 0.5 % ophthalmic solution Place 1 drop into both eyes 3 (three) times daily.      Marland Kitchen trimethoprim-polymyxin b (POLYTRIM) ophthalmic solution Place 1 drop into the left eye 4 (four) times daily.  10 mL  0   Assessment: 48 yo female w/ Periorbital cellulitis of left eye- not improving on eye drops and to be placed on IV Vanc and Cipro w/ continuation of eye drops.   Noted neurology recommendation to continue IV antibiotics x 7 days.  Renal function stable. Vancomycin trough at goal.   Vancomycin 3/15 >> Cipro 3/15 >>  Goal of Therapy:   Vancomycin trough level 10-15 mcg/ml  Plan:  Continue Vancomycin 1gm IV every 12 hours Continue Cipro 400mg  IV q12h Weekly Vancomycin trough  Monitor renal function and cx data  Duration of therapy per MD  Biagio Borg 05/20/2013,3:55 PM

## 2013-05-20 NOTE — Telephone Encounter (Signed)
Spoke with pt, I recommended she proceed with the spinal tap in setting of her meningeal signs and abscess, MRI is fairly normal She will f/u with neurology in AM We will also come up with a game plan for work pending her discharge

## 2013-05-21 ENCOUNTER — Inpatient Hospital Stay (HOSPITAL_COMMUNITY): Payer: PRIVATE HEALTH INSURANCE

## 2013-05-21 ENCOUNTER — Encounter (HOSPITAL_COMMUNITY): Payer: Self-pay | Admitting: Radiology

## 2013-05-21 DIAGNOSIS — H00039 Abscess of eyelid unspecified eye, unspecified eyelid: Secondary | ICD-10-CM

## 2013-05-21 DIAGNOSIS — H109 Unspecified conjunctivitis: Secondary | ICD-10-CM

## 2013-05-21 DIAGNOSIS — L03211 Cellulitis of face: Secondary | ICD-10-CM

## 2013-05-21 DIAGNOSIS — L0201 Cutaneous abscess of face: Secondary | ICD-10-CM

## 2013-05-21 DIAGNOSIS — R51 Headache: Secondary | ICD-10-CM

## 2013-05-21 LAB — CBC
HEMATOCRIT: 36.9 % (ref 36.0–46.0)
HEMOGLOBIN: 12.3 g/dL (ref 12.0–15.0)
MCH: 29.8 pg (ref 26.0–34.0)
MCHC: 33.3 g/dL (ref 30.0–36.0)
MCV: 89.3 fL (ref 78.0–100.0)
Platelets: 224 10*3/uL (ref 150–400)
RBC: 4.13 MIL/uL (ref 3.87–5.11)
RDW: 12.9 % (ref 11.5–15.5)
WBC: 4.7 10*3/uL (ref 4.0–10.5)

## 2013-05-21 LAB — PROTEIN AND GLUCOSE, CSF
GLUCOSE CSF: 61 mg/dL (ref 43–76)
TOTAL PROTEIN, CSF: 33 mg/dL (ref 15–45)

## 2013-05-21 LAB — CSF CELL COUNT WITH DIFFERENTIAL
RBC Count, CSF: 29 /mm3 — ABNORMAL HIGH
Tube #: 3
WBC, CSF: 1 /mm3 (ref 0–5)

## 2013-05-21 MED ORDER — ACETAMINOPHEN 325 MG PO TABS
650.0000 mg | ORAL_TABLET | ORAL | Status: DC | PRN
Start: 2013-05-21 — End: 2013-05-23

## 2013-05-21 MED ORDER — HYDROMORPHONE HCL PF 1 MG/ML IJ SOLN
0.2500 mg | Freq: Once | INTRAMUSCULAR | Status: AC
  Administered 2013-05-21: 0.25 mg via INTRAVENOUS
  Filled 2013-05-21: qty 1

## 2013-05-21 NOTE — Progress Notes (Signed)
Patient ID: [name], female   DOB: January 24, 1966, 48 y.o.   MRN: [id number]  Clark A. Merlene Laughter, MD     [email or web address]          [name] is an 48 y.o. female.   Assessment/Plan: 1. Left-sided weakness and numbness in the Setting of facial cellulitis of unclear etiology. MRI FAILED to reveal an acute infarct or intracranial process explain the left-sided weakness. There is concern that she may have CSF involvement and therefore spinal fluid assessment is recommended. This may change the treatment terms of dose and the length of of IV antibiotics. The patient is extremely reluctant to have the CSF analysis done however. In the meantime, we will recommend continuation of IV antibiotics for at least 7 days.  She seemed to have slightly worsened left-sided weakness and some evidence of meningismus. We had lengthy discussions with the patient's family including her mother, sister works at this hospital and daughter. Think the patient has agreed to proceed with a spinal tap. Risk and benefits are explained at length to the patient included a 15% chance of post LP headaches. We will also obtain a cervical spine MRI.  The patient is sleeping in the lights are out. She complains of having a left-sided headache and left-sided neck pain. She grades her headache as 7/10.   GENERAL: She appears to be in discomfort but no acute distress.  HEENT: Atraumatic normocephalic. The sclera are both significantly injected especially on the left side- This seems worse than yesterday with some puffiness bilaterally and increased erythema. She complains of mild soreness of the neck on that manipulation on the left side and on flexion of the neck.  ABDOMEN: soft  EXTREMITIES: No edema  BACK: Normal.  SKIN: Normal by inspection.  MENTAL STATUS: Alert and oriented. Speech, language and cognition are generally intact. Judgment and insight normal.  CRANIAL NERVES: Pupils are equal, round  and reactive to light and accommodation; extra ocular movements are full, there is no significant nystagmus; visual fields are full; upper and lower facial muscles are normal in strength and symmetric, there is no flattening of the nasolabial folds; tongue is midline; uvula is midline; shoulder elevation is normal.  MOTOR: The right side shows normal tone, bulk and strength. LUE-The Left deltoid 4/5, triceps 4-/5 and handgrip 4/5. LUE- Hip flexion 4/5 and dorsiflexion 4-/5. She has a downward drift of the left upper extremity.  COORDINATION: Left finger to nose is normal, right finger to nose is normal, No rest tremor; no intention tremor; no postural tremor; no bradykinesia.  REFLEXES: Deep tendon reflexes are symmetrical and normal. Babinski reflexes are flexor bilaterally.  SENSATION: Normal to light touch.  GAIT:Not tested.        Objective: Vital signs in last 24 hours: Temp:  [97.3 F (36.3 C)-98 F (36.7 C)] 97.3 F (36.3 C) (03/18 0516) Pulse Rate:  [60-65] 60 (03/18 0516) Resp:  [20] 20 (03/18 0516) BP: (105-117)/(51-57) 117/51 mmHg (03/18 0516) SpO2:  [97 %-98 %] 97 % (03/18 0516) Weight:  [90.447 kg (199 lb 6.4 oz)] 90.447 kg (199 lb 6.4 oz) (03/18 0516)  Intake/Output from previous day: 03/17 0701 - 03/18 0700 In: 2000 [P.O.:1200; IV Piggyback:800] Out: 3 [Urine:3] Intake/Output this shift:   Nutritional status: Cardiac   Lab Results: Results for orders placed during the hospital encounter of 05/18/13 (from the past 48 hour(s))  LIPID PANEL     Status: None   Collection Time    05/19/13  9:27 AM      Result Value Ref Range   Cholesterol 147  0 - 200 mg/dL   Triglycerides 119  <150 mg/dL   HDL 57  >39 mg/dL   Total CHOL/HDL Ratio 2.6     VLDL 24  0 - 40 mg/dL   LDL Cholesterol 66  0 - 99 mg/dL   Comment:            Total Cholesterol/HDL:CHD Risk     Coronary Heart Disease Risk Table                         Men   Women      1/2 Average Risk   3.4   3.3       Average Risk       5.0   4.4      2 X Average Risk   9.6   7.1      3 X Average Risk  23.4   11.0                Use the calculated Patient Ratio     above and the CHD Risk Table     to determine the patient's CHD Risk.                ATP III CLASSIFICATION (LDL):      <100     mg/dL   Optimal      100-129  mg/dL   Near or Above                        Optimal      130-159  mg/dL   Borderline      160-189  mg/dL   High      >190     mg/dL   Very High  HEMOGLOBIN A1C     Status: None   Collection Time    05/19/13  9:27 AM      Result Value Ref Range   Hemoglobin A1C 5.2  <5.7 %   Comment: (NOTE)                                                                               According to the ADA Clinical Practice Recommendations for 2011, when     HbA1c is used as a screening test:      >=6.5%   Diagnostic of Diabetes Mellitus               (if abnormal result is confirmed)     5.7-6.4%   Increased risk of developing Diabetes Mellitus     References:Diagnosis and Classification of Diabetes Mellitus,Diabetes     [id number] 1):S62-S69 and Standards of Medical Care in             Diabetes - 2011,Diabetes Care,2011,34 (Suppl 1):S11-S61.   Mean Plasma Glucose 103  <117 mg/dL   Comment: Performed at [facility]  CBC     Status: Abnormal   Collection Time    05/20/13  5:54 AM      Result Value Ref Range   WBC 4.6  4.0 - 10.5  K/uL   RBC 3.99  3.87 - 5.11 MIL/uL   Hemoglobin 11.6 (*) 12.0 - 15.0 g/dL   HCT 35.6 (*) 36.0 - 46.0 %   MCV 89.2  78.0 - 100.0 fL   MCH 29.1  26.0 - 34.0 pg   MCHC 32.6  30.0 - 36.0 g/dL   RDW 13.0  11.5 - 15.5 %   Platelets 210  150 - 400 K/uL  VANCOMYCIN, TROUGH     Status: None   Collection Time    05/20/13  2:58 PM      Result Value Ref Range   Vancomycin Tr 10.1  10.0 - 20.0 ug/mL  CBC     Status: None   Collection Time    05/21/13  5:48 AM      Result Value Ref Range   WBC 4.7  4.0 - 10.5 K/uL   RBC 4.13  3.87 - 5.11 MIL/uL    Hemoglobin 12.3  12.0 - 15.0 g/dL   HCT 36.9  36.0 - 46.0 %   MCV 89.3  78.0 - 100.0 fL   MCH 29.8  26.0 - 34.0 pg   MCHC 33.3  30.0 - 36.0 g/dL   RDW 12.9  11.5 - 15.5 %   Platelets 224  150 - 400 K/uL    Lipid Panel  Recent Labs  05/19/13 0927  CHOL 147  TRIG 119  HDL 57  CHOLHDL 2.6  VLDL 24  LDLCALC 66    Studies/Results: Mr Jeri Cos Wo Contrast  05/19/2013   CLINICAL DATA:  Left scrotal cellulitis with headache and left-sided weakness. Evaluate for cerebral venous thrombosis.  EXAM: MRI HEAD WITHOUT AND WITH CONTRAST  MRV HEAD WITHOUT CONTRAST  TECHNIQUE: Multiplanar, multiecho pulse sequences of the brain and surrounding structures were obtained without and with intravenous contrast. Angiographic images of the head were obtained using MRA technique without contrast.  CONTRAST:  30mL MULTIHANCE GADOBENATE DIMEGLUMINE 529 MG/ML IV SOLN  COMPARISON:  Previous brain MRI not available (2004). Head CT from yesterday.  FINDINGS: MRI HEAD FINDINGS  Calvarium and upper cervical spine: No marrow signal abnormality.  Orbits: Edema and asymmetric enhancement around the left preseptal globe. 4 mm area of hypoenhancement which could represent small residual collection or trapped gas. This is smaller than previously seen. The imaged facial vein and the superior ophthalmic vein are patent. There is no asymmetric enhancement in the cavernous sinuses.  Sinuses: Enlargement of the nasopharyngeal lymphoid tissue, likely reactive given the clinical circumstances. There is T2 hyperintense opacification of the right mastoid air cells in middle ear which is chronic, dating back to 2005 at least.  Brain: No acute abnormality such as acute infarct, hemorrhage, hydrocephalus, or mass lesion. No evidence of large vessel occlusion. No abnormal intracranial enhancement. There is subtle sulcal high intensity on FLAIR imaging which is likely normal pial vessels.  MRV HEAD FINDINGS  No evidence for dural venous sinus,  deep venous thrombosis or cortical vein thrombosis. 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 The cavernous sinuses are symmetric on conventional postcontrast imaging.  IMPRESSION: 1. Negative for intracranial venous thrombosis or intracranial abscess. 2. Subtle signal in the sulci which is likely normal pial vessels. If there is clinical concern for meningitis, recommend CSF analysis. 3. Left periorbital cellulitis. No postseptal involvement. There is a 4 mm area of hypoenhancement in the left orbital midline which could represent tiny abscess or trapped gas.   Electronically Signed  By: Jorje Guild M.D.   On: 05/19/2013 11:26   Mr Mrv Head Wo Cm  05/19/2013   CLINICAL DATA:  Left scrotal cellulitis with headache and left-sided weakness. Evaluate for cerebral venous thrombosis.  EXAM: MRI HEAD WITHOUT AND WITH CONTRAST  MRV HEAD WITHOUT CONTRAST  TECHNIQUE: Multiplanar, multiecho pulse sequences of the brain and surrounding structures were obtained without and with intravenous contrast. Angiographic images of the head were obtained using MRA technique without contrast.  CONTRAST:  1mL MULTIHANCE GADOBENATE DIMEGLUMINE 529 MG/ML IV SOLN  COMPARISON:  Previous brain MRI not available (2004). Head CT from yesterday.  FINDINGS: MRI HEAD FINDINGS  Calvarium and upper cervical spine: No marrow signal abnormality.  Orbits: Edema and asymmetric enhancement around the left preseptal globe. 4 mm area of hypoenhancement which could represent small residual collection or trapped gas. This is smaller than previously seen. The imaged facial vein and the superior ophthalmic vein are patent. There is no asymmetric enhancement in the cavernous sinuses.  Sinuses: Enlargement of the nasopharyngeal lymphoid tissue, likely reactive given the clinical circumstances. There is T2 hyperintense opacification of the right mastoid air cells in middle ear which is chronic,  dating back to 2005 at least.  Brain: No acute abnormality such as acute infarct, hemorrhage, hydrocephalus, or mass lesion. No evidence of large vessel occlusion. No abnormal intracranial enhancement. There is subtle sulcal high intensity on FLAIR imaging which is likely normal pial vessels.  MRV HEAD FINDINGS  No evidence for dural venous sinus, deep venous thrombosis or cortical vein thrombosis. 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 The cavernous sinuses are symmetric on conventional postcontrast imaging.  IMPRESSION: 1. Negative for intracranial venous thrombosis or intracranial abscess. 2. Subtle signal in the sulci which is likely normal pial vessels. If there is clinical concern for meningitis, recommend CSF analysis. 3. Left periorbital cellulitis. No postseptal involvement. There is a 4 mm area of hypoenhancement in the left orbital midline which could represent tiny abscess or trapped gas.   Electronically Signed   By: Jorje Guild M.D.   On: 05/19/2013 11:26    Medications:  Scheduled Meds: . aspirin EC  81 mg Oral Daily  . ciprofloxacin  400 mg Intravenous Q12H  . enoxaparin (LOVENOX) injection  40 mg Subcutaneous Q24H  . escitalopram  10 mg Oral Daily  . ketorolac  1 drop Both Eyes QID  . sodium chloride  3 mL Intravenous Q12H  . sodium chloride  3 mL Intravenous Q12H  . vancomycin  1,000 mg Intravenous Q12H   Continuous Infusions:  PRN Meds:.sodium chloride, HYDROcodone-acetaminophen, ondansetron (ZOFRAN) IV, ondansetron, sodium chloride     LOS: 3 days   Brookley Spitler A. Merlene Laughter, M.D.  Diplomate, Tax adviser of Psychiatry and Neurology ( Neurology).

## 2013-05-21 NOTE — Procedures (Signed)
Fluoro-guided LP performed at L4-L5.  Opening pressure 18 cm H2O.  12 mL of clear CSF obtained.  No complications.  See full dictation in PACS for further details.

## 2013-05-21 NOTE — Progress Notes (Signed)
05/21/13 1038 patient may have spinal tap completed today per radiology. Patient and sister concerned regarding whether it would be today or tomorrow. Discussed with Dr. Merlene Laughter, stated may be completed today if possible, otherwise plan for tomorrow. Donavan Foil, RN

## 2013-05-21 NOTE — Progress Notes (Signed)
Lumbar puncture no signs of distress. Taken to MRI for exam. Instructed by MD and staff to lay flat for 4 hrs.

## 2013-05-21 NOTE — Progress Notes (Addendum)
Patient seen, independently examined and chart reviewed. I agree with exam, assessment and plan discussed with Dyanne Carrel, NP.  I am currently unable to cosign her note and this progress note serves as my Optician, dispensing.  48 year old woman diagnosed with conjunctivitis on office visit 3/10. Both grandson and daughter with conjunctivitis. Presented to emergency department 3/15 with complaint of bilateral eye redness, left greater than right. Associated with headache and weakness of the left arm as well as numbness. In ED noted to have periorbital edema on the left, scleral injection on the right as well as 4/5 left arm strength,pronator drift and difficulty holding up the left leg off the bed. Admitted for treatment of conjunctivitis, periorbital cellulitis and further evaluation of left-sided weakness.  Placed on antibiotics for her renal cellulitis, noted to be improving. Dr. Roderic Palau discussed with ophthalmology Dr. Nehemiah Massed who felt this was likely viral and recommended Ketoralac and continue antibiotics as well as outpatient followup. Neurology has recommended an extensive evaluation including lumbar puncture.  Thus far she has remained afebrile, without leukocytosis. Extensive imaging: CT of the orbits revealed left lateral periorbital cellulitis with abscess left upper lid, no evidence of post septal involvement. CT of the head unremarkable. MRI of the brain no evidence of stroke. MRV unremarkable. Again periorbital cellulitis seen. MRI of cervical spine unremarkable.  CSF fluid analysis unremarkable, albeit on antibiotics. Culture pending.  PMH Depression, anxiety, hypertension  Subjective: A little more swelling around the eye today, however the eye is less painful, less blurry and overall improved, significantly so compared to admission. She still has some left-sided head pain from this. No neck pain. She reports some intermittent paresthesias in the left upper and lower  extremity.  Objective: Afebrile, vitals are stable. Nontoxic. Cardiovascular regular rate and rhythm. Respiratory clear to auscultation bilaterally. No wheezes, rales or rhonchi. Normal respiratory effort. Eyes: Red eye with minimal scleral injection, left eye has significant ecchymosis with scleral injection. Bilateral pupils are round and appear symmetric. Extraocular movements are intact bilaterally. There is mild periorbital edema without tenderness. There is no fluid collection or induration of the upper eyelid. Nontender to palpation. She is able to keep the left eye open without difficulty. There is no erythema of her face or induration seen. Neurologic exam she has giveaway strength of the left lower extremity but is able to raise it off the bed. Normal right upper and right lower extremity strength. Grip strength diminished on the left side. Some pronator drift left upper extremity.This appears to be consistent with neurologic exam recorded by the physicians throughout hospitalization.  CBC today unremarkable. CSF studies noted generally unremarkable.  There appeared to be 2 distinct processes here. Periorbital cellulitis the left side does appear to be improving. There is no evidence of orbital involvement but she will need close outpatient followup with ophthalmology. Positive sick contacts and bilateral nature would suggest viral illness. For now continue current antibiotics.  Left-sided weakness is of unclear etiology and significance. Imaging of the brain and cervical spine is unremarkable. She has no signs or symptoms particularly suggestive of meningitis at this point or CSF involvement. She seems to have some giveaway strength at least of the left lower extremity. Neurology evaluation is appreciated. Await further recommendations.  Discuss with infectious disease 3/19, may be able to avoid PICC line.  Check HIV. Resume neuro checks. On prophylactic Lovenox ordered since admission and  not held for procedure. Will hold for now and consider restarting in 24 hours.  Murray Hodgkins, MD [facility] [phone]

## 2013-05-21 NOTE — Progress Notes (Signed)
[facility] PROGRESS NOTE  [name] FXT:[id number] DOB: [birth year] DOA: 05/18/2013 PCP: Odette Fraction, MD  [place] 48 yo admitted with bilateral conjunctivitis that progressed to left periorbital cellulitis with small abscess left upper lid per imaging. No post septal involvement.  In addition left sided weakness and headache. MRI/MRV no acute infarct and no venous thrombosis. Concern there may be CSF involvement.  Neuro scheduling spinal tap.    Assessment/Plan: Principal Problem:  Periorbital cellulitis of left eye- related to bilateral conjunctivitis. CT scan of orbits did not indicate any post septal involvement. Case discussed with Dr. Nehemiah Massed who recommended IV antibiotics. Increased swelling of tissue around  Left eye and left side of face. Chemosis in left eye unchanged but it is less red. Right eye chemosis and redness improved. Ketorlac drops continued.   Continue iv vanc and cipro day #4. Continues with pain/pressure with eye movement. She is afebrile and non-toxic appearing.   Active Problems:  Conjunctivitis- Ketoralac drops. See above. Will follow up with Dr. Nehemiah Massed at discharge.   Left-sided weakness/Numbness and tingling of left arm and leg -  Etiology unclear. Ct does not show any intracranial absess. MRI/MRV no acute infarct and no venous thrombosis. Appreciate  neuro recommendations. Concern for CSF involvement. Spinal tap scheduled for today per radiology.  Continue aspirin. Lipid panel and A1c within the limits of normal.   Abscess eyelid:per orbital CT left lateral periorbital cellulitis with a developing abscess in the left upper lid. No evidence for postseptal invasion. Antibiotics as above. She is afebrile and non-toxic appearing. Monitor closely.   Headache: describes as pressure around left eye. Likely related to #1. MRI/MRV negative infarct/abscess/thrombosis. Pain meds. monitor   Hypertension: controlled. not on home medications. Will monitor    Anxiety: appears stable at baseline. Very anxious about being out of work. Support provided  Code Status: full Family Communication: none present Disposition Plan: home when ready   Consultants:  Neurology  Procedures:  none  Antibiotics: cipro 05/19/13>>  Vancomycin 05/19/13>>     HPI/Subjective: Sitting up in bed watching TV. Reports pain/discomfort the same and medications effective.   Objective: Filed Vitals:   05/21/13 0516  BP: 117/51  Pulse: 60  Temp: 97.3 F (36.3 C)  Resp: 20    Intake/Output Summary (Last 24 hours) at 05/21/13 1114 Last data filed at 05/21/13 0508  Gross per 24 hour  Intake   1760 ml  Output      3 ml  Net   1757 ml   Filed Weights   05/18/13 1910 05/19/13 0647 05/21/13 0516  Weight: 89.449 kg (197 lb 3.2 oz) 90.674 kg (199 lb 14.4 oz) 90.447 kg (199 lb 6.4 oz)    Exam:   General:  Well nourished NAD  Cardiovascular: RRR No MGR No LE edema PPP  Respiratory: normal effort BS clear bilaterally no wheeze no rhonchi  Abdomen: non-distended. Soft +BS non-tender to palpation  Musculoskeletal: no clubbing or cyanosis. Good muscle tone  HEENT: erythema of conjunctivae improved, increased swelling of tissue around left eye and left side of face. Chemosis left eye somewhat worse today. Left side of face tender to touch.   Neuro: UE strength 4/5 on left. LE strength 5/5 bilaterally  Data Reviewed: Basic Metabolic Panel:  Recent Labs Lab 05/18/13 1532 05/19/13 0625  NA 139 141  K 4.0 4.2  CL 100 105  CO2 27 28  GLUCOSE 82 125*  BUN 12 11  CREATININE 0.55 0.66  CALCIUM 9.4 8.7  Liver Function Tests:  Recent Labs Lab 05/18/13 1532  AST 43*  ALT 63*  ALKPHOS 175*  BILITOT 0.3  PROT 7.7  ALBUMIN 3.7   No results found for this basename: LIPASE, AMYLASE,  in the last 168 hours No results found for this basename: AMMONIA,  in the last 168 hours CBC:  Recent Labs Lab 05/18/13 1532 05/19/13 0625  05/20/13 0554 05/21/13 0548  WBC 4.4 4.0 4.6 4.7  NEUTROABS 2.4  --   --   --   HGB 13.9 13.1 11.6* 12.3  HCT 41.6 39.6 35.6* 36.9  MCV 87.8 90.2 89.2 89.3  PLT 236 227 210 224   Cardiac Enzymes:  Recent Labs Lab 05/18/13 1532  TROPONINI <0.30   BNP (last 3 results) No results found for this basename: PROBNP,  in the last 8760 hours CBG:  Recent Labs Lab 05/18/13 1515  GLUCAP 74    No results found for this or any previous visit (from the past 240 hour(s)).   Studies: No results found.  Scheduled Meds: . aspirin EC  81 mg Oral Daily  . ciprofloxacin  400 mg Intravenous Q12H  . enoxaparin (LOVENOX) injection  40 mg Subcutaneous Q24H  . escitalopram  10 mg Oral Daily  . ketorolac  1 drop Both Eyes QID  . sodium chloride  3 mL Intravenous Q12H  . sodium chloride  3 mL Intravenous Q12H  . vancomycin  1,000 mg Intravenous Q12H   Continuous Infusions:   Principal Problem:   Periorbital cellulitis of left eye Active Problems:   Hypertension   Anxiety   Left-sided weakness   Numbness and tingling of left arm and leg   CVA (cerebral infarction)   Abscess, eyelid   Headache(784.0)   Conjunctivitis    Time spent: 30 minutes    [facility] Pager [phone]. If 7PM-7AM, please contact night-coverage at [email or web address], password [email or web address] 05/21/2013, 11:14 AM  LOS: 3 days

## 2013-05-21 NOTE — Progress Notes (Signed)
05/21/13 1652 Patient c/o persistent headache, left eye pain unrelieved with vicodin 2 tablets as ordered for pain, given at 1500. Requested something else for pain if possible. Discussed that headache is side effect of spinal tap and IV pain medications may cause headache as well. Stated she understood. Notified Dyanne Carrel, NP of persistent pain unrelieved with Vicodin. Order received for dilaudid 0.25 mg IV x 1 dose. Donavan Foil, RN

## 2013-05-22 LAB — HIV ANTIBODY (ROUTINE TESTING W REFLEX): HIV: NONREACTIVE

## 2013-05-22 MED ORDER — MAGNESIUM HYDROXIDE 400 MG/5ML PO SUSP: 30.0000 mL | Freq: Every day | ORAL | Status: DC | PRN

## 2013-05-22 MED ORDER — BUTALBITAL-APAP-CAFFEINE 50-325-40 MG PO TABS
2.0000 | ORAL_TABLET | ORAL | Status: DC | PRN
  Administered 2013-05-22 – 2013-05-23 (×4): 2 via ORAL
  Filled 2013-05-22 (×4): qty 2

## 2013-05-22 MED ORDER — SENNOSIDES-DOCUSATE SODIUM 8.6-50 MG PO TABS
1.0000 | ORAL_TABLET | Freq: Two times a day (BID) | ORAL | Status: DC
  Administered 2013-05-22 – 2013-05-23 (×3): 1 via ORAL
  Filled 2013-05-22 (×3): qty 1

## 2013-05-22 MED ORDER — CIPROFLOXACIN HCL 250 MG PO TABS
500.0000 mg | ORAL_TABLET | Freq: Two times a day (BID) | ORAL | Status: DC
Start: 2013-05-22 — End: 2013-05-23
  Administered 2013-05-22 – 2013-05-23 (×2): 500 mg via ORAL
  Filled 2013-05-22 (×2): qty 2

## 2013-05-22 NOTE — Progress Notes (Signed)
[facility] PROGRESS NOTE  [name] [id number] DOB: [birth year] DOA: 05/18/2013 PCP: Odette Fraction, MD  [place]  48 yo diagnosed with conjunctivitis on office visit 3/10. Family members with conjunctivitis. Admitted with bilateral conjunctivitis that progressed to left periorbital cellulitis with small abscess left upper lid per imaging. No post septal involvement. In addition left sided weakness and headache. MRI/MRV no acute infarct and no venous thrombosis. Concern there may be CSF involvement. CSF analysis no evidence of meningitis.    Assessment/Plan: Principal Problem:  Periorbital cellulitis of left eye- related to bilateral conjunctivitis. CT scan of orbits did not indicate any post septal involvement. Case discussed with Dr. Nehemiah Massed who recommended IV antibiotics. Swelling of tissue around Left eye and left side of face improve. Chemosis in left eye slightly improved. Right eye chemosis and redness improved. EOMI intact. Ketorlac drops continued. Continue iv vanc and cipro day #5. Continues with pain/pressure with eye movement. Fiorcet provided. She is afebrile and non-toxic appearing.  Active Problems:  Conjunctivitis- Ketoralac drops. See above. Will follow up with Dr. Nehemiah Massed at 05/23/13. Left-sided weakness/Numbness and tingling of left arm and leg -  Improved today. Etiology unclear. Ct does not show any intracranial absess. MRI/MRV no acute infarct and no venous thrombosis. CSF analysis no evidence of meningitis. Evaluated by Dr. Merlene Laughter neurology who opine likely functional weakness with no anatomical correlations to her symptoms.  Lipid panel and A1c within the limits of normal.  Abscess eyelid:per orbital CT left lateral periorbital cellulitis with a developing abscess in the left upper lid. No evidence for postseptal invasion. Antibiotics as above. She is afebrile and non-toxic appearing. Monitor closely.  Headache: describes as pressure around left eye.  Improved today.  Likely related to #1. MRI/MRV negative infarct/abscess/thrombosis. Pain meds. monitor  Hypertension: controlled. not on home medications. Will monitor  Anxiety: appears stable at baseline. Very anxious about being out of work. Support provided      Code Status: full Family Communication: none present Disposition Plan: home tomorrow   Consultants:  neurology  Procedures:  Lumbar puncture 05/21/13  Antibiotics: cipro 05/19/13>>  Vancomycin 05/19/13>>     HPI/Subjective: Ambulating in room without problem. Reports some improvement in eye pain  Objective: Filed Vitals:   05/22/13 0440  BP: 116/66  Pulse: 56  Temp: 97.6 F (36.4 C)  Resp: 20    Intake/Output Summary (Last 24 hours) at 05/22/13 1054 Last data filed at 05/22/13 0857  Gross per 24 hour  Intake   1843 ml  Output      0 ml  Net   1843 ml   Filed Weights   05/18/13 1910 05/19/13 0647 05/21/13 0516  Weight: 89.449 kg (197 lb 3.2 oz) 90.674 kg (199 lb 14.4 oz) 90.447 kg (199 lb 6.4 oz)    Exam:   General:  Calm appears comfortable  Cardiovascular: RRR No MGR No LE edema  Respiratory: normal effort BS clear bilaterally no wheeze no rhonchi  Abdomen: obese soft +BS non-tender to palpation  Musculoskeletal: no clubbing no cyanosis   Data Reviewed: Basic Metabolic Panel:  Recent Labs Lab 05/18/13 1532 05/19/13 0625  NA 139 141  K 4.0 4.2  CL 100 105  CO2 27 28  GLUCOSE 82 125*  BUN 12 11  CREATININE 0.55 0.66  CALCIUM 9.4 8.7   Liver Function Tests:  Recent Labs Lab 05/18/13 1532  AST 43*  ALT 63*  ALKPHOS 175*  BILITOT 0.3  PROT 7.7  ALBUMIN 3.7   No results  found for this basename: LIPASE, AMYLASE,  in the last 168 hours No results found for this basename: AMMONIA,  in the last 168 hours CBC:  Recent Labs Lab 05/18/13 1532 05/19/13 0625 05/20/13 0554 05/21/13 0548  WBC 4.4 4.0 4.6 4.7  NEUTROABS 2.4  --   --   --   HGB 13.9 13.1 11.6* 12.3  HCT  41.6 39.6 35.6* 36.9  MCV 87.8 90.2 89.2 89.3  PLT 236 227 210 224   Cardiac Enzymes:  Recent Labs Lab 05/18/13 1532  TROPONINI <0.30   BNP (last 3 results) No results found for this basename: PROBNP,  in the last 8760 hours CBG:  Recent Labs Lab 05/18/13 1515  GLUCAP 74    Recent Results (from the past 240 hour(s))  CSF CULTURE     Status: None   Collection Time    05/21/13 12:30 PM      Result Value Ref Range Status   Specimen Description CSF   Final   Special Requests NONE   Final   Gram Stain     Final   Value: CYTOSPIN WBC PRESENT, PREDOMINANTLY MONONUCLEAR     NO ORGANISMS SEEN     Performed at [facility]   Culture     Final   Value: NO GROWTH     Performed at [facility]   Report Status PENDING   Incomplete     Studies: Mr Cervical Spine Wo Contrast  05/21/2013   CLINICAL DATA:  New onset of numbness and tingling and weakness in the left arm and leg.  EXAM: MRI CERVICAL SPINE WITHOUT CONTRAST  TECHNIQUE: Multiplanar, multisequence MR imaging was performed. No intravenous contrast was administered.  COMPARISON:  None.  FINDINGS: The visualized intracranial contents and paraspinal soft tissues are normal. No mass lesion or myelopathy of the cervical spinal cord. No facet arthritis in the cervical spine.  C1-2 through C3-4:  Normal.  C4-5: Tiny focal disc protrusion into the left lateral recess touching the ventral aspect of the spinal cord. No myelopathy. This might affect the ventral left C5 nerve rootlet.  C5-6: Small disc protrusion just to the left of midline which touches the ventral aspect of the spinal cord. No myelopathy. Uncinate spurs minimally narrow the right neural foramen.  C6-7: Small disc protrusion just to the right of midline touching the ventral aspect of the spinal cord without myelopathy.  C7-T1 and T1-2:  Normal.  IMPRESSION: 1. Tiny disc protrusions to the left at C4-5 and C5-6. 2. Tiny disc protrusion to the right at C6-7.    Electronically Signed   By: Rozetta Nunnery M.D.   On: 05/21/2013 13:56   Dg Fluoro Guide Lumbar Puncture  05/21/2013   CLINICAL DATA:  Headache, fever, potential meningitis.  EXAM: DIAGNOSTIC LUMBAR PUNCTURE UNDER FLUOROSCOPIC GUIDANCE  FLUOROSCOPY TIME:  1 min and 12 seconds  PROCEDURE: Informed consent was obtained from the patient prior to the procedure, including potential complications of headache, allergy, and pain. With the patient prone, the lower back was prepped with Betadine. 1% Lidocaine was used for local anesthesia. Lumbar puncture was performed at the L4-L5 level using a 20 gauge needle with return of clear CSF with an opening pressure of 18 cm water. 12 ml of CSF were obtained for laboratory studies. The patient tolerated the procedure well and there were no apparent complications.  IMPRESSION: 1. Successful uncomplicated fluoroscopic guided lumbar puncture at L4-L5 with opening pressure of 18 cm of water and withdrawal of  12 mL of clear cerebrospinal fluid.   Electronically Signed   By: Vinnie Langton M.D.   On: 05/21/2013 13:31    Scheduled Meds: . ciprofloxacin  400 mg Intravenous Q12H  . escitalopram  10 mg Oral Daily  . ketorolac  1 drop Both Eyes QID  . senna-docusate  1 tablet Oral BID  . sodium chloride  3 mL Intravenous Q12H  . sodium chloride  3 mL Intravenous Q12H  . vancomycin  1,000 mg Intravenous Q12H   Continuous Infusions:   Principal Problem:   Periorbital cellulitis of left eye Active Problems:   Hypertension   Anxiety   Left-sided weakness   Numbness and tingling of left arm and leg   CVA (cerebral infarction)   Abscess, eyelid   Headache(784.0)   Conjunctivitis    Time spent: 35 minutes    [facility] Pager [phone]. If 7PM-7AM, please contact night-coverage at [email or web address], password [facility] 05/22/2013, 10:54 AM  LOS: 4 days

## 2013-05-22 NOTE — Progress Notes (Signed)
05/22/13 1851 Patient c/o redness, itching, irritation to IV site while cipro infusing. IV site flushed prior to cipro infusion, flushed well, good blood return. IV site remained in place, good blood return. No swelling noted. Notified MD. Stated would change Cipro to po, would like patient to receive vancomycin IV this evening. IV site d/c'd, within normal limits. discussed with patient. Vancomycin to be given per night shift RN with new IV access. Notified pharmacy to adjust times. Donavan Foil, RN

## 2013-05-22 NOTE — Progress Notes (Signed)
Patient seen, independently examined and chart reviewed. I agree with exam, assessment and plan discussed with Dyanne Carrel, NP.  Subjective: Improving strength left upper left lower extremity. Overall feeling better. Left eye feels about the same. Still has some blurriness in the left side. Eyelid edema improving. Optimistic about going home tomorrow.  Objective: Afebrile, vital signs are stable. No hypoxia. Appears calm and comfortable. Speech fluent and clear. Cardiovascular regular rate and rhythm. No murmur, rub or gallop. Respiratory clear to auscultation bilaterally. No wheezes, rales or rhonchi. Normal respiratory effort. Left upper and left lower extreme strength much improved. Grossly normal strength bilateral lower extremities.  Right eye still has scleral injection without significant exudate. Periorbital edema is improved today with no erythema. No tenderness to palpation. Still has significant chemosis of the left eye with some eye daily. Extraocular movements are intact.  HIV nonreactive.  Overall she is improving rapidly. The left upper and left lower extremity strength within functional in nature. Periorbital cellulitis is improving rapidly. Bilateral conjunctivitis is slowly improving. Plan discharge home 3/20, outpatient followup same day with ophthalmology arranged.  Murray Hodgkins, MD [facility] [phone]

## 2013-05-22 NOTE — Progress Notes (Signed)
Patient ID: [name], female   DOB: Jan 14, 1966, 48 y.o.   MRN: [id number]  [place] A. Merlene Laughter, MD     [email or web address]          [name] is an 48 y.o. female.   Assessment/Plan: 1. Left-sided weakness and numbness in the Setting of facial cellulitis of unclear etiology. MRI of the cervical and brain has failed to show etiology. She therefore likely has a functional weakness which seems to be improving. Again no anatomical correlations her symptoms. This will likely improve with time. CSF analysis shows no evidence of meningitis. She likely can receive another day of IV antibiotics and be discharged home on oral antibiotics. 2.  Left facial headache and neck pain likely due to resolving cellulitis. We'll try the patient on oral Fioricet. 3. The patient likely can be discharged home tomorrow. She should follow up in the office in 2 weeks.   The patient is sleeping in the lights are out. She complains of having a left-sided headache and left-sided neck pain. She reports her headaches are improving. She underwent a spinal fluid analysis yesterday and apparently did well with this.   GENERAL: She appears to be in discomfort but no acute distress.  HEENT: Atraumatic normocephalic. The sclera are both significantly injected especially on the left side- This seems about the same as  yesterday with some puffiness bilaterally and increased erythema. She complains of mild soreness of the neck on that manipulation on the left side and on flexion of the neck.  ABDOMEN: soft  EXTREMITIES: No edema  BACK: Normal.  SKIN: Normal by inspection.  MENTAL STATUS: Alert and oriented. Speech, language and cognition are generally intact. Judgment and insight normal.  CRANIAL NERVES: Pupils are equal, round and reactive to light and accommodation; extra ocular movements are full, there is no significant nystagmus; visual fields are full; upper and lower facial muscles are normal  in strength and symmetric, there is no flattening of the nasolabial folds; tongue is midline; uvula is midline; shoulder elevation is normal.  MOTOR: The right side shows normal tone, bulk and strength. LUE-The Left deltoid 4+/5, triceps 4+/5 and handgrip 4/5. LUE- Hip flexion 4+/5 and dorsiflexion 4/5. She has a downward drift of the left upper extremity.  COORDINATION: Left finger to nose is normal, right finger to nose is normal, No rest tremor; no intention tremor; no postural tremor; no bradykinesia.  REFLEXES: Deep tendon reflexes are symmetrical and normal. Babinski reflexes are flexor bilaterally.  SENSATION: Normal to light touch.  GAIT:Not tested.        Objective: Vital signs in last 24 hours: Temp:  [97.6 F (36.4 C)-97.9 F (36.6 C)] 97.6 F (36.4 C) (03/19 0440) Pulse Rate:  [54-63] 56 (03/19 0440) Resp:  [18-20] 20 (03/19 0440) BP: (94-116)/(52-68) 116/66 mmHg (03/19 0440) SpO2:  [97 %-99 %] 99 % (03/19 0440)  Intake/Output from previous day: 03/18 0701 - 03/19 0700 In: 1843 [P.O.:1040; I.V.:3; IV Piggyback:800] Out: -  Intake/Output this shift:   Nutritional status: Cardiac   Lab Results: Results for orders placed during the hospital encounter of 05/18/13 (from the past 48 hour(s))  VANCOMYCIN, TROUGH     Status: None   Collection Time    05/20/13  2:58 PM      Result Value Ref Range   Vancomycin Tr 10.1  10.0 - 20.0 ug/mL  CBC     Status: None   Collection Time    05/21/13  5:48 AM  Result Value Ref Range   WBC 4.7  4.0 - 10.5 K/uL   RBC 4.13  3.87 - 5.11 MIL/uL   Hemoglobin 12.3  12.0 - 15.0 g/dL   HCT 36.9  36.0 - 46.0 %   MCV 89.3  78.0 - 100.0 fL   MCH 29.8  26.0 - 34.0 pg   MCHC 33.3  30.0 - 36.0 g/dL   RDW 12.9  11.5 - 15.5 %   Platelets 224  150 - 400 K/uL  PROTEIN AND GLUCOSE, CSF     Status: None   Collection Time    05/21/13 12:30 PM      Result Value Ref Range   Glucose, CSF 61  43 - 76 mg/dL   Total  Protein, CSF 33  15 - 45  mg/dL  CSF CELL COUNT WITH DIFFERENTIAL     Status: Abnormal   Collection Time    05/21/13 12:30 PM      Result Value Ref Range   Tube # 3     Color, CSF COLORLESS  COLORLESS   Appearance, CSF CLEAR  CLEAR   Supernatant NOT INDICATED     RBC Count, CSF 29 (*) 0 /cu mm   WBC, CSF 1  0 - 5 /cu mm   Segmented Neutrophils-CSF TOO FEW TO COUNT, SMEAR AVAILABLE FOR REVIEW  0 - 6 %  CSF CULTURE     Status: None   Collection Time    05/21/13 12:30 PM      Result Value Ref Range   Specimen Description CSF     Special Requests NONE     Gram Stain       Value: CYTOSPIN WBC PRESENT, PREDOMINANTLY MONONUCLEAR     NO ORGANISMS SEEN     Performed at [facility]   Culture PENDING     Report Status PENDING      Lipid Panel  Recent Labs  05/19/13 0927  CHOL 147  TRIG 119  HDL 57  CHOLHDL 2.6  VLDL 24  LDLCALC 66    Studies/Results: Mr Cervical Spine Wo Contrast  05/21/2013   CLINICAL DATA:  New onset of numbness and tingling and weakness in the left arm and leg.  EXAM: MRI CERVICAL SPINE WITHOUT CONTRAST  TECHNIQUE: Multiplanar, multisequence MR imaging was performed. No intravenous contrast was administered.  COMPARISON:  None.  FINDINGS: The visualized intracranial contents and paraspinal soft tissues are normal. No mass lesion or myelopathy of the cervical spinal cord. No facet arthritis in the cervical spine.  C1-2 through C3-4:  Normal.  C4-5: Tiny focal disc protrusion into the left lateral recess touching the ventral aspect of the spinal cord. No myelopathy. This might affect the ventral left C5 nerve rootlet.  C5-6: Small disc protrusion just to the left of midline which touches the ventral aspect of the spinal cord. No myelopathy. Uncinate spurs minimally narrow the right neural foramen.  C6-7: Small disc protrusion just to the right of midline touching the ventral aspect of the spinal cord without myelopathy.  C7-T1 and T1-2:  Normal.  IMPRESSION: 1. Tiny disc protrusions to  the left at C4-5 and C5-6. 2. Tiny disc protrusion to the right at C6-7.   Electronically Signed   By: Rozetta Nunnery M.D.   On: 05/21/2013 13:56   Dg Fluoro Guide Lumbar Puncture  05/21/2013   Vinnie Langton, MD     05/21/2013  1:34 PM Fluoro-guided LP performed at L4-L5.  Opening pressure 18 cm H2O.  12 mL of clear CSF obtained.  No complications.  See full  dictation in PACS for further details.    Medications:  Scheduled Meds: . ciprofloxacin  400 mg Intravenous Q12H  . escitalopram  10 mg Oral Daily  . ketorolac  1 drop Both Eyes QID  . sodium chloride  3 mL Intravenous Q12H  . sodium chloride  3 mL Intravenous Q12H  . vancomycin  1,000 mg Intravenous Q12H   Continuous Infusions:  PRN Meds:.sodium chloride, acetaminophen, HYDROcodone-acetaminophen, ondansetron (ZOFRAN) IV, ondansetron, sodium chloride     LOS: 4 days   Annitta Fifield A. Merlene Laughter, M.D.  Diplomate, [employer] adviser of Psychiatry and Neurology ( Neurology).

## 2013-05-23 LAB — BASIC METABOLIC PANEL
BUN: 10 mg/dL (ref 6–23)
CALCIUM: 8.9 mg/dL (ref 8.4–10.5)
CHLORIDE: 106 meq/L (ref 96–112)
CO2: 29 mEq/L (ref 19–32)
CREATININE: 0.68 mg/dL (ref 0.50–1.10)
GFR calc Af Amer: >90 mL/min (ref >90)
GFR calc non Af Amer: >90 mL/min (ref >90)
GLUCOSE: 91 mg/dL (ref 70–99)
Potassium: 4.3 mEq/L (ref 3.7–5.3)
Sodium: 144 mEq/L (ref 137–147)

## 2013-05-23 MED ORDER — ALUM & MAG HYDROXIDE-SIMETH 200-200-20 MG/5ML PO SUSP
30.0000 mL | ORAL | Status: DC | PRN
  Administered 2013-05-23: 30 mL via ORAL
  Filled 2013-05-23: qty 30

## 2013-05-23 MED ORDER — DOXYCYCLINE HYCLATE 100 MG PO TABS: 100.0000 mg | ORAL_TABLET | Freq: Two times a day (BID) | ORAL | Status: DC

## 2013-05-23 MED ORDER — HYDROCODONE-ACETAMINOPHEN 5-325 MG PO TABS: 1.0000 | ORAL_TABLET | ORAL | Status: DC | PRN

## 2013-05-23 MED ORDER — DOXYCYCLINE HYCLATE 100 MG PO TABS
100.0000 mg | ORAL_TABLET | Freq: Two times a day (BID) | ORAL | Status: DC
Start: 2013-05-23 — End: 2013-05-23
  Administered 2013-05-23: 100 mg via ORAL
  Filled 2013-05-23: qty 1

## 2013-05-23 MED ORDER — CIPROFLOXACIN HCL 500 MG PO TABS: ORAL_TABLET | ORAL | Status: DC

## 2013-05-23 NOTE — Progress Notes (Signed)
Patient ID: [name], female   DOB: 10/20/1965, 48 y.o.   MRN: [id number]  Enigma A. Merlene Laughter, MD     [email or web address]          [name] is an 48 y.o. female.   Assessment/Plan: 1. Left-sided weakness and numbness in the Setting of facial cellulitis of unclear etiology. MRI of the cervical and brain has failed to show etiology. She therefore likely has a functional weakness which seems to be improving. Again no anatomical correlations her symptoms. This will likely improve with time. CSF analysis shows no evidence of meningitis. She likely can receive another day of IV antibiotics and be discharged home on oral antibiotics. 2.  Left facial headache and neck pain likely due to resolving cellulitis. She did well with the fioricet. 3. The patient likely can be discharged home today. She should follow up in the office in 2 weeks.   She reports significant improvement in headaches with the Fioricet. She still continues to have left-sided weakness. Exam is unchanged however.  GENERAL: She appears to be in discomfort but no acute distress.  HEENT: Atraumatic normocephalic. There is significant improvement in the erythema and swelling of the sclera. Facial edema is all resolved. There is still some soreness of the neck. SKIN: Normal by inspection.  MENTAL STATUS: Alert and oriented. Speech, language and cognition are generally intact. Judgment and insight normal.  CRANIAL NERVES: Pupils are equal, round and reactive to light and accommodation; extra ocular movements are full, there is no significant nystagmus; visual fields are full; upper and lower facial muscles are normal in strength and symmetric, there is no flattening of the nasolabial folds; tongue is midline; uvula is midline; shoulder elevation is normal.  MOTOR: The right side shows normal tone, bulk and strength. LUE-The Left deltoid 4+/5, triceps 4+/5 and handgrip 4/5. LUE- Hip flexion 4+/5 and  dorsiflexion 4/5. She has a downward drift of the left upper extremity.  COORDINATION: Left finger to nose is normal, right finger to nose is normal, No rest tremor; no intention tremor; no postural tremor; no bradykinesia.         Objective: Vital signs in last 24 hours: Temp:  [97.9 F (36.6 C)-98.6 F (37 C)] 98.3 F (36.8 C) (03/20 0429) Pulse Rate:  [48-68] 68 (03/20 0429) Resp:  [20] 20 (03/20 0429) BP: (103-134)/(67-81) 103/67 mmHg (03/20 0429) SpO2:  [98 %-100 %] 99 % (03/20 0429)  Intake/Output from previous day: 03/19 0701 - 03/20 0700 In: 923 [P.O.:720; I.V.:3; IV Piggyback:200] Out: -  Intake/Output this shift:   Nutritional status: Cardiac   Lab Results: Results for orders placed during the hospital encounter of 05/18/13 (from the past 48 hour(s))  PROTEIN AND GLUCOSE, CSF     Status: None   Collection Time    05/21/13 12:30 PM      Result Value Ref Range   Glucose, CSF 61  43 - 76 mg/dL   Total  Protein, CSF 33  15 - 45 mg/dL  CSF CELL COUNT WITH DIFFERENTIAL     Status: Abnormal   Collection Time    05/21/13 12:30 PM      Result Value Ref Range   Tube # 3     Color, CSF COLORLESS  COLORLESS   Appearance, CSF CLEAR  CLEAR   Supernatant NOT INDICATED     RBC Count, CSF 29 (*) 0 /cu mm   WBC, CSF 1  0 - 5 /cu mm   Segmented  Neutrophils-CSF TOO FEW TO COUNT, SMEAR AVAILABLE FOR REVIEW  0 - 6 %  CSF CULTURE     Status: None   Collection Time    05/21/13 12:30 PM      Result Value Ref Range   Specimen Description CSF     Special Requests NONE     Gram Stain       Value: CYTOSPIN WBC PRESENT, PREDOMINANTLY MONONUCLEAR     NO ORGANISMS SEEN     Performed at [facility]   Culture       Value: NO GROWTH     Performed at [facility]   Report Status PENDING    HIV ANTIBODY (ROUTINE TESTING)     Status: None   Collection Time    05/22/13  6:31 AM      Result Value Ref Range   HIV NON REACTIVE  NON REACTIVE   Comment: (NOTE)      Effective June 09, 2013, [facility] will no longer offer the     current 3rd Generation HIV diagnostic screening assay, HIV Antibodies,     HIV-1/2 EIA, with reflexes. At that time, [facility] will     only offer HIV-1/2 Ag/Ab, 4th Gen, w/ Reflexes as recommended by the     CDC. This HIV diagnostic screening assay tests for antibodies to HIV-1     and HIV-2 as well as HIV p24 antigen and provides greater sensitivity     for the detection of recent infection. Any orders for the 3rd     Generation assay will automatically be referred to the 4th Generation     assay.     Performed at [place]     Status: None   Collection Time    05/23/13  6:23 AM      Result Value Ref Range   Sodium 144  137 - 147 mEq/L   Potassium 4.3  3.7 - 5.3 mEq/L   Chloride 106  96 - 112 mEq/L   CO2 29  19 - 32 mEq/L   Glucose, Bld 91  70 - 99 mg/dL   BUN 10  6 - 23 mg/dL   Creatinine, Ser 0.68  0.50 - 1.10 mg/dL   Calcium 8.9  8.4 - 10.5 mg/dL   GFR calc non Af Amer >90  >90 mL/min   GFR calc Af Amer >90  >90 mL/min   Comment: (NOTE)     The eGFR has been calculated using the CKD EPI equation.     This calculation has not been validated in all clinical situations.     eGFR's persistently <90 mL/min signify possible Chronic Kidney     Disease.    Lipid Panel No results found for this basename: CHOL, TRIG, HDL, CHOLHDL, VLDL, LDLCALC,  in the last 72 hours  Studies/Results: Mr Cervical Spine Wo Contrast  05/21/2013   CLINICAL DATA:  New onset of numbness and tingling and weakness in the left arm and leg.  EXAM: MRI CERVICAL SPINE WITHOUT CONTRAST  TECHNIQUE: Multiplanar, multisequence MR imaging was performed. No intravenous contrast was administered.  COMPARISON:  None.  FINDINGS: The visualized intracranial contents and paraspinal soft tissues are normal. No mass lesion or myelopathy of the cervical spinal cord. No facet arthritis in the cervical spine.   C1-2 through C3-4:  Normal.  C4-5: Tiny focal disc protrusion into the left lateral recess touching the ventral aspect of the spinal cord. No myelopathy.  This might affect the ventral left C5 nerve rootlet.  C5-6: Small disc protrusion just to the left of midline which touches the ventral aspect of the spinal cord. No myelopathy. Uncinate spurs minimally narrow the right neural foramen.  C6-7: Small disc protrusion just to the right of midline touching the ventral aspect of the spinal cord without myelopathy.  C7-T1 and T1-2:  Normal.  IMPRESSION: 1. Tiny disc protrusions to the left at C4-5 and C5-6. 2. Tiny disc protrusion to the right at C6-7.   Electronically Signed   By: Rozetta Nunnery M.D.   On: 05/21/2013 13:56   Dg Fluoro Guide Lumbar Puncture  05/21/2013   CLINICAL DATA:  Headache, fever, potential meningitis.  EXAM: DIAGNOSTIC LUMBAR PUNCTURE UNDER FLUOROSCOPIC GUIDANCE  FLUOROSCOPY TIME:  1 min and 12 seconds  PROCEDURE: Informed consent was obtained from the patient prior to the procedure, including potential complications of headache, allergy, and pain. With the patient prone, the lower back was prepped with Betadine. 1% Lidocaine was used for local anesthesia. Lumbar puncture was performed at the L4-L5 level using a 20 gauge needle with return of clear CSF with an opening pressure of 18 cm water. 12 ml of CSF were obtained for laboratory studies. The patient tolerated the procedure well and there were no apparent complications.  IMPRESSION: 1. Successful uncomplicated fluoroscopic guided lumbar puncture at L4-L5 with opening pressure of 18 cm of water and withdrawal of 12 mL of clear cerebrospinal fluid.   Electronically Signed   By: Vinnie Langton M.D.   On: 05/21/2013 13:31    Medications:  Scheduled Meds: . ciprofloxacin  500 mg Oral BID  . escitalopram  10 mg Oral Daily  . ketorolac  1 drop Both Eyes QID  . senna-docusate  1 tablet Oral BID  . sodium chloride  3 mL Intravenous Q12H  .  sodium chloride  3 mL Intravenous Q12H  . vancomycin  1,000 mg Intravenous Q12H   Continuous Infusions:  PRN Meds:.sodium chloride, acetaminophen, butalbital-acetaminophen-caffeine, HYDROcodone-acetaminophen, magnesium hydroxide, ondansetron (ZOFRAN) IV, ondansetron, sodium chloride     LOS: 5 days   Nataly Pacifico A. Merlene Laughter, M.D.  Diplomate, Tax adviser of Psychiatry and Neurology ( Neurology).

## 2013-05-23 NOTE — Discharge Summary (Signed)
Patient seen, independently examined and chart reviewed. I agree with exam, assessment and plan discussed with Dyanne Carrel, NP.  Subjective: She feels better today. Left-sided weakness is improving. Left is improving as well. No new issues.  Objective: Afebrile, vital signs are stable. No hypoxia. She appears calm and comfortable. Speech is fluent and clear. Cardiovascular regular rate and rhythm. No murmur, rub or gallop. No lower extremity edema. Respiratory clear to auscultation bilaterally. No wheezes, rales or rhonchi. Normal respiratory effort. Psychiatric grossly normal mood and affect. Speech fluent and appropriate. Neurologic exam is grossly unchanged. She is able to lift both legs off the bed at the same time. Left upper extremity strength slightly diminished compared to the right. There is no facial erythema. Edema around the left eye has resolved him as completely. There is significantly less chemosis of the left eye today with less scleral injection. Extraocular movements are intact. Cornea is clear and pupil appears normal. The left eyelids are without evidence of abscess.  Basic metabolic panel unremarkable. CSF culture no growth to date. Cell counts were unremarkable.  Overall she continues to improve with regard to her conjunctivitis, periorbital cellulitis as well as left sided functional weakness. Plan discharge today with ophthalmology followup today and neurology followup in 2 weeks.  Murray Hodgkins, MD [facility] [phone]

## 2013-05-23 NOTE — Progress Notes (Signed)
Patient seen, independently examined and chart reviewed. I agree with exam, assessment and plan discussed with Dyanne Carrel, NP.   Note I was unable to [facility] Ms. Black's progress note 3/18 secondary to computer malfunction. See my progress note same date 3/18.   Murray Hodgkins, MD [facility] [phone]

## 2013-05-23 NOTE — Discharge Summary (Signed)
Physician Discharge Summary  [name] EUM:[id number] DOB: May 28, 1965 DOA: 05/18/2013  PCP: Leo Grosser, MD  Admit date: 05/18/2013 Discharge date: 05/23/2013  Time spent: 40 minutes  Recommendations for Outpatient Follow-up:  1. Dr. Gwen Pounds, opthalmology 05/23/13, day of discharge for evaluation of periorbital cellulitis and bilateral conjunctivitis.  2. Dr. Gerilyn Pilgrim, neurology in 2 weeks for evaluation of left sided weakness  Discharge Diagnoses:  Principal Problem:   Periorbital cellulitis of left eye Active Problems:   Hypertension   Anxiety   Left-sided weakness   Numbness and tingling of left arm and leg   Abscess, eyelid   Headache(784.0)   Conjunctivitis   Discharge Condition: stable  Diet recommendation: regular  Filed Weights   05/18/13 1910 05/19/13 0647 05/21/13 0516  Weight: 89.449 kg (197 lb 3.2 oz) 90.674 kg (199 lb 14.4 oz) 90.447 kg (199 lb 6.4 oz)    History of present illness:  48 yo yo female had been having conjuctivitis for 5 days prior to admission on 05/18/13 in both eyes. Both grandson and daughter with conjunctivitis. Was placed on cipro and bactrim eye drops then. The left eye had gotten worse for the previous 2 days, with more swelling and redness. Fever for over 24 hours. Nauseas no vomiting. No cough. In ED noted to have periorbital edema on the left, scleral injection on the right as sell as 4/5 left arm strength, pronator drift and difficulty holding up the left leg. No difficulty speaking, talking, swallowing. No facial drooping. Admitted for conjunctivitis, periorbital cellulitis and further evaluation of left sided weakness  Hospital Course:  Principal Problem:  Periorbital cellulitis of left eye- related to bilateral conjunctivitis. CT scan of orbits did not indicate any post septal involvement. Case discussed with Dr. Gwen Pounds who recommended IV antibiotics. Swelling of tissue around Left eye and left side of face improved quickly.  Chemosis in left eye slowly improved. Right eye chemosis.much improved. EOMI remained intact. Ketorlac drops continued. Provided with  iv vanc and cipro for 5 days. Will discharge with 5 more days of cipro and 5 days Doxy to complete 10 day course. She experienced mild to moderate pain/pressure with eye movement. This is much improved at discharge. She she remained afebrile and non-toxic appearing. She has appointment on day of discharge with Dr. Gwen Pounds for evaluation and OP follow up.   Active Problems:  Conjunctivitis- Ketoralac drops. See above. Will follow up with Dr. Gwen Pounds at 05/23/13.  Left-sided weakness/Numbness and tingling of left arm and leg - Etiology unclear. Ct does not show any intracranial absess. MRI/MRV no acute infarct and no venous thrombosis. CSF analysis no evidence of meningitis. Evaluated by Dr. Gerilyn Pilgrim neurology who opine likely functional weakness with no anatomical correlations to her symptoms. Lipid panel and A1c within the limits of normal. At discharge this is mostly resolved. She ambulates in room with steady gait. No deficits left arm. Will follow up with Dr. Gerilyn Pilgrim in 2 weeks. Abscess eyelid:per orbital CT left lateral periorbital cellulitis with a developing abscess in the left upper lid. No evidence for postseptal invasion. Antibiotics as above. She remained afebrile and non-toxic appearing.   Headache: described as pressure around left eye. Likely related to #1. MRI/MRV negative infarct/abscess/thrombosis. Resolved at discharge.   Hypertension: controlled. not on home medications.   Anxiety: appears stable at baseline. Very anxious about being out of work. Support provided    Procedures: Lumbar puncture 05/21/13  Consultations:  Dr. Gerilyn Pilgrim neurology  Discharge Exam: Filed Vitals:   05/23/13 [phone]  BP: 103/67  Pulse: 68  Temp: 98.3 F (36.8 C)  Resp: 20    General: well nourished calm appears comfortable Cardiovascular: RRR No MGR No LE  edema Respiratory: normal effort BS clear bilaterally no wheeze HEENT: right eye with scleral injection but improved. Periorbital edema much improved. No erythema. No tenderness to palpation. Chemosis in left eye improving as well. EOM intact.   Discharge Instructions  Discharge Orders   Future Orders Complete By Expires   Diet - low sodium heart healthy  As directed    Discharge instructions  As directed    Comments:     Take medication as directed. Follow up with opthalmology today as scheduled   Increase activity slowly  As directed        Medication List    STOP taking these medications       moxifloxacin 0.5 % ophthalmic solution  Commonly known as:  VIGAMOX     trimethoprim-polymyxin b ophthalmic solution  Commonly known as:  POLYTRIM     ZITHROMAX Z-PAK 250 MG tablet  Generic drug:  azithromycin      TAKE these medications       ciprofloxacin 500 MG tablet  Commonly known as:  CIPRO  Take 1 tab 2 times daily. Last day 05/28/13     doxycycline 100 MG tablet  Commonly known as:  VIBRA-TABS  Take 1 tablet (100 mg total) by mouth every 12 (twelve) hours.     escitalopram 10 MG tablet  Commonly known as:  LEXAPRO  Take 1 tablet (10 mg total) by mouth daily.     HYDROcodone-acetaminophen 5-325 MG per tablet  Commonly known as:  NORCO/VICODIN  Take 1-2 tablets by mouth every 4 (four) hours as needed for moderate pain.       Allergies  Allergen Reactions  . Penicillins Hives       Follow-up Information   Follow up with Abel Presto, MD On 05/23/2013. (appointment at 11:10 am for evaluation of peri-orbital cellulitus and conjunctivitis)    Specialty:  Ophthalmology   Contact information:   [place] [address] [phone]       Follow up with Phillips Odor, MD On 06/10/2013. (appointment 4:45)    Specialty:  Neurology   Contact information:   [address] [place] [address] [phone]        The  results of significant diagnostics from this hospitalization (including imaging, microbiology, ancillary and laboratory) are listed below for reference.    Significant Diagnostic Studies: Ct Head Wo Contrast  05/18/2013   CLINICAL DATA:  Severe headache and eye pressure.  EXAM: CT HEAD WITHOUT CONTRAST  TECHNIQUE: Contiguous axial images were obtained from the base of the skull through the vertex without intravenous contrast.  COMPARISON:  CT HEAD W/O CM dated 03/01/2004; CT ORBITS W/CM dated 05/18/2013  FINDINGS: Sinuses/Soft tissues: Left periorbital soft tissue swelling will be better evaluated on contrast-enhanced dedicated CT. Right-sided mastoid effusion is chronic  Intracranial: No mass lesion, hemorrhage, hydrocephalus, acute infarct, intra-axial, or extra-axial fluid collection.  IMPRESSION: 1.  No acute intracranial abnormality. 2. Left periorbital soft tissue swelling, better evaluated on dedicated orbit CT. Please see that report. 3. Chronic right-sided mastoid effusion.   Electronically Signed   By: Abigail Miyamoto M.D.   On: 05/18/2013 15:30   Mr Jeri Cos WN Contrast  05/19/2013   CLINICAL DATA:  Left scrotal cellulitis with headache and left-sided weakness. Evaluate for cerebral venous thrombosis.  EXAM:  MRI HEAD WITHOUT AND WITH CONTRAST  MRV HEAD WITHOUT CONTRAST  TECHNIQUE: Multiplanar, multiecho pulse sequences of the brain and surrounding structures were obtained without and with intravenous contrast. Angiographic images of the head were obtained using MRA technique without contrast.  CONTRAST:  87mL MULTIHANCE GADOBENATE DIMEGLUMINE 529 MG/ML IV SOLN  COMPARISON:  Previous brain MRI not available (2004). Head CT from yesterday.  FINDINGS: MRI HEAD FINDINGS  Calvarium and upper cervical spine: No marrow signal abnormality.  Orbits: Edema and asymmetric enhancement around the left preseptal globe. 4 mm area of hypoenhancement which could represent small residual collection or trapped gas. This  is smaller than previously seen. The imaged facial vein and the superior ophthalmic vein are patent. There is no asymmetric enhancement in the cavernous sinuses.  Sinuses: Enlargement of the nasopharyngeal lymphoid tissue, likely reactive given the clinical circumstances. There is T2 hyperintense opacification of the right mastoid air cells in middle ear which is chronic, dating back to 2005 at least.  Brain: No acute abnormality such as acute infarct, hemorrhage, hydrocephalus, or mass lesion. No evidence of large vessel occlusion. No abnormal intracranial enhancement. There is subtle sulcal high intensity on FLAIR imaging which is likely normal pial vessels.  MRV HEAD FINDINGS  No evidence for dural venous sinus, deep venous thrombosis or cortical vein thrombosis. 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 The cavernous sinuses are symmetric on conventional postcontrast imaging.  IMPRESSION: 1. Negative for intracranial venous thrombosis or intracranial abscess. 2. Subtle signal in the sulci which is likely normal pial vessels. If there is clinical concern for meningitis, recommend CSF analysis. 3. Left periorbital cellulitis. No postseptal involvement. There is a 4 mm area of hypoenhancement in the left orbital midline which could represent tiny abscess or trapped gas.   Electronically Signed   By: Jorje Guild M.D.   On: 05/19/2013 11:26   Mr Cervical Spine Wo Contrast  05/21/2013   CLINICAL DATA:  New onset of numbness and tingling and weakness in the left arm and leg.  EXAM: MRI CERVICAL SPINE WITHOUT CONTRAST  TECHNIQUE: Multiplanar, multisequence MR imaging was performed. No intravenous contrast was administered.  COMPARISON:  None.  FINDINGS: The visualized intracranial contents and paraspinal soft tissues are normal. No mass lesion or myelopathy of the cervical spinal cord. No facet arthritis in the cervical spine.  C1-2 through C3-4:  Normal.   C4-5: Tiny focal disc protrusion into the left lateral recess touching the ventral aspect of the spinal cord. No myelopathy. This might affect the ventral left C5 nerve rootlet.  C5-6: Small disc protrusion just to the left of midline which touches the ventral aspect of the spinal cord. No myelopathy. Uncinate spurs minimally narrow the right neural foramen.  C6-7: Small disc protrusion just to the right of midline touching the ventral aspect of the spinal cord without myelopathy.  C7-T1 and T1-2:  Normal.  IMPRESSION: 1. Tiny disc protrusions to the left at C4-5 and C5-6. 2. Tiny disc protrusion to the right at C6-7.   Electronically Signed   By: Rozetta Nunnery M.D.   On: 05/21/2013 13:56   Mr Mrv Head Wo Cm  05/19/2013   CLINICAL DATA:  Left scrotal cellulitis with headache and left-sided weakness. Evaluate for cerebral venous thrombosis.  EXAM: MRI HEAD WITHOUT AND WITH CONTRAST  MRV HEAD WITHOUT CONTRAST  TECHNIQUE: Multiplanar, multiecho pulse sequences of the brain and surrounding structures were obtained without and with intravenous contrast.  Angiographic images of the head were obtained using MRA technique without contrast.  CONTRAST:  35mL MULTIHANCE GADOBENATE DIMEGLUMINE 529 MG/ML IV SOLN  COMPARISON:  Previous brain MRI not available (2004). Head CT from yesterday.  FINDINGS: MRI HEAD FINDINGS  Calvarium and upper cervical spine: No marrow signal abnormality.  Orbits: Edema and asymmetric enhancement around the left preseptal globe. 4 mm area of hypoenhancement which could represent small residual collection or trapped gas. This is smaller than previously seen. The imaged facial vein and the superior ophthalmic vein are patent. There is no asymmetric enhancement in the cavernous sinuses.  Sinuses: Enlargement of the nasopharyngeal lymphoid tissue, likely reactive given the clinical circumstances. There is T2 hyperintense opacification of the right mastoid air cells in middle ear which is chronic, dating  back to 2005 at least.  Brain: No acute abnormality such as acute infarct, hemorrhage, hydrocephalus, or mass lesion. No evidence of large vessel occlusion. No abnormal intracranial enhancement. There is subtle sulcal high intensity on FLAIR imaging which is likely normal pial vessels.  MRV HEAD FINDINGS  No evidence for dural venous sinus, deep venous thrombosis or cortical vein thrombosis. 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 The cavernous sinuses are symmetric on conventional postcontrast imaging.  IMPRESSION: 1. Negative for intracranial venous thrombosis or intracranial abscess. 2. Subtle signal in the sulci which is likely normal pial vessels. If there is clinical concern for meningitis, recommend CSF analysis. 3. Left periorbital cellulitis. No postseptal involvement. There is a 4 mm area of hypoenhancement in the left orbital midline which could represent tiny abscess or trapped gas.   Electronically Signed   By: Jorje Guild M.D.   On: 05/19/2013 11:26   Ct Orbits W/cm  05/18/2013   CLINICAL DATA:  Left orbital cellulitis. Overall draining and swelling for 5 days.  EXAM: CT ORBITS WITH CONTRAST  TECHNIQUE: Multidetector CT imaging of the orbits was performed following the bolus administration of intravenous contrast.  CONTRAST:  33mL OMNIPAQUE IOHEXOL 300 MG/ML  SOLN  COMPARISON:  CT head from the same day.  FINDINGS: Limited imaging of the postcontrast brain is unremarkable. And and a periorbital soft tissue swelling and is evident within the eyelids and along the lateral margin of the orbit. The globe is intact. There may be a small abscess within the left upper lid laterally measuring 4 x 14 mm  There is no evidence for postseptal disease. The extraocular muscles are intact. The cavernous sinus is within normal limits bilaterally. The visualized paranasal sinuses are clear.  IMPRESSION: 1. Left lateral periorbital cellulitis with a  developing abscess in the left upper lid. 2. No evidence for postseptal invasion.   Electronically Signed   By: Lawrence Santiago M.D.   On: 05/18/2013 15:29   Dg Fluoro Guide Lumbar Puncture  05/21/2013   CLINICAL DATA:  Headache, fever, potential meningitis.  EXAM: DIAGNOSTIC LUMBAR PUNCTURE UNDER FLUOROSCOPIC GUIDANCE  FLUOROSCOPY TIME:  1 min and 12 seconds  PROCEDURE: Informed consent was obtained from the patient prior to the procedure, including potential complications of headache, allergy, and pain. With the patient prone, the lower back was prepped with Betadine. 1% Lidocaine was used for local anesthesia. Lumbar puncture was performed at the L4-L5 level using a 20 gauge needle with return of clear CSF with an opening pressure of 18 cm water. 12 ml of CSF were obtained for laboratory studies. The patient tolerated the procedure well and there were no apparent  complications.  IMPRESSION: 1. Successful uncomplicated fluoroscopic guided lumbar puncture at L4-L5 with opening pressure of 18 cm of water and withdrawal of 12 mL of clear cerebrospinal fluid.   Electronically Signed   By: Vinnie Langton M.D.   On: 05/21/2013 13:31    Microbiology: Recent Results (from the past 240 hour(s))  CSF CULTURE     Status: None   Collection Time    05/21/13 12:30 PM      Result Value Ref Range Status   Specimen Description CSF   Final   Special Requests NONE   Final   Gram Stain     Final   Value: CYTOSPIN WBC PRESENT, PREDOMINANTLY MONONUCLEAR     NO ORGANISMS SEEN     Performed at [facility]   Culture     Final   Value: NO GROWTH     Performed at [facility]   Report Status PENDING   Incomplete     Labs: Basic Metabolic Panel:  Recent Labs Lab 05/18/13 1532 05/19/13 0625 05/23/13 0623  NA 139 141 144  K 4.0 4.2 4.3  CL 100 105 106  CO2 27 28 29   GLUCOSE 82 125* 91  BUN 12 11 10   CREATININE 0.55 0.66 0.68  CALCIUM 9.4 8.7 8.9   Liver Function Tests:  Recent  Labs Lab 05/18/13 1532  AST 43*  ALT 63*  ALKPHOS 175*  BILITOT 0.3  PROT 7.7  ALBUMIN 3.7   No results found for this basename: LIPASE, AMYLASE,  in the last 168 hours No results found for this basename: AMMONIA,  in the last 168 hours CBC:  Recent Labs Lab 05/18/13 1532 05/19/13 0625 05/20/13 0554 05/21/13 0548  WBC 4.4 4.0 4.6 4.7  NEUTROABS 2.4  --   --   --   HGB 13.9 13.1 11.6* 12.3  HCT 41.6 39.6 35.6* 36.9  MCV 87.8 90.2 89.2 89.3  PLT 236 227 210 224   Cardiac Enzymes:  Recent Labs Lab 05/18/13 1532  TROPONINI <0.30   BNP: BNP (last 3 results) No results found for this basename: PROBNP,  in the last 8760 hours CBG:  Recent Labs Lab 05/18/13 1515  GLUCAP 74       Signed:  Yulia Ulrich M  [facility] 05/23/2013, 8:28 AM

## 2013-05-25 LAB — CSF CULTURE W GRAM STAIN: Culture: NO GROWTH

## 2013-05-25 LAB — CRYPTOCOCCAL ANTIGEN, CSF: Crypto Ag: NEGATIVE

## 2013-05-25 LAB — CSF CULTURE

## 2013-05-25 LAB — VDRL, CSF: SYPHILIS VDRL QUANT CSF: NONREACTIVE

## 2013-05-25 LAB — ANGIOTENSIN CONVERTING ENZYME, CSF: Angio Convert Enzyme: 1 U/L (ref <=15)

## 2013-06-10 ENCOUNTER — Ambulatory Visit (INDEPENDENT_AMBULATORY_CARE_PROVIDER_SITE_OTHER): Payer: PRIVATE HEALTH INSURANCE | Admitting: Family Medicine

## 2013-06-10 ENCOUNTER — Encounter: Payer: Self-pay | Admitting: Family Medicine

## 2013-06-10 VITALS — BP 134/72 | HR 88 | Temp 98.8°F | Resp 16 | Ht 65.5 in | Wt 192.0 lb

## 2013-06-10 DIAGNOSIS — K5289 Other specified noninfective gastroenteritis and colitis: Secondary | ICD-10-CM

## 2013-06-10 DIAGNOSIS — R112 Nausea with vomiting, unspecified: Secondary | ICD-10-CM

## 2013-06-10 DIAGNOSIS — K529 Noninfective gastroenteritis and colitis, unspecified: Secondary | ICD-10-CM

## 2013-06-10 MED ORDER — ONDANSETRON 4 MG PO TBDP: 4.0000 mg | ORAL_TABLET | Freq: Three times a day (TID) | ORAL | Status: DC | PRN

## 2013-06-10 MED ORDER — PROMETHAZINE HCL 25 MG/ML IJ SOLN
25.0000 mg | Freq: Once | INTRAMUSCULAR | Status: AC
  Administered 2013-06-10: 25 mg via INTRAMUSCULAR

## 2013-06-10 NOTE — Progress Notes (Signed)
Patient ID: [name], female   DOB: 1965/11/17, 48 y.o.   MRN: [id number]   Subjective:    Patient ID: [name], female    DOB: 03/06/1966, 48 y.o.   MRN: [id number]  Patient presents for Illness  Patient here with nausea vomiting diarrhea which started around 8 PM last night. She was at work yesterday she did eat macaroni salad as well as a potato salad for dinner but no one else had any symptoms for eating similar foods that she is aware of. She was in her normal state of health until around 8 PM she began to feel very nauseous she did try taking some Phenergan however vomited that up. She's been vomiting at least 8 times from last night and this morning. She's also had multiple loose watery stools. She denies any abdominal pain but has soreness from where she is vomiting is also given her a headache. She's not had any fever. She did complete antibiotics for apparent orbital cellulitis of her left eye about 2 weeks ago.   Review Of Systems:  GEN- denies fatigue, fever, weight loss,weakness, recent illness HEENT- denies eye drainage, change in vision, nasal discharge, CVS- denies chest pain, palpitations RESP- denies SOB, cough, wheeze ABD- denies N/V, change in stools, abd pain  Neuro- denies headache, dizziness, syncope, seizure activity       Objective:    BP 134/72  Pulse 88  Temp(Src) 98.8 F (37.1 C) (Oral)  Resp 16  Ht 5' 5.5" (1.664 m)  Wt 192 lb (87.091 kg)  BMI 31.45 kg/m2 GEN- NAD, alert and oriented x3 HEENT- PERRL, EOMI, non injected sclera, pink conjunctiva, MMM, oropharynx clear Neck- Supple, no LAD CVS- RRR, no murmur RESP-CTAB ABD-NABS,soft, mild TTP epigastrium, no rebound, no gaurding EXT- No edema Pulses- Radial 2+        Assessment & Plan:      Problem List Items Addressed This Visit   None    Visit Diagnoses   Gastroenteritis    -  Primary   Patient here with likely gastroenteritis. I've given her a shot of Phenergan in the office  she will see if she can continue the Finnegan tablets after this if not I have sent ODT Zofran. She needs to push fluids. Have given her a note to be out of work. I don't see any red flags on exam hopefully this will clear up at the next 24-48 hours.     Note: This dictation was prepared with Dragon dictation along with smaller phrase technology. Any transcriptional errors that result from this process are unintentional.

## 2013-06-10 NOTE — Patient Instructions (Signed)
Phenergan given Zofran sublingual sent to pharmacy if needed Push fluids, rest Note for work Call if you get worse

## 2013-07-21 ENCOUNTER — Encounter: Payer: Self-pay | Admitting: Physician Assistant

## 2013-07-21 ENCOUNTER — Ambulatory Visit (INDEPENDENT_AMBULATORY_CARE_PROVIDER_SITE_OTHER): Payer: PRIVATE HEALTH INSURANCE | Admitting: Physician Assistant

## 2013-07-21 VITALS — BP 116/70 | HR 76 | Temp 98.5°F | Resp 18 | Wt 196.0 lb

## 2013-07-21 DIAGNOSIS — A499 Bacterial infection, unspecified: Secondary | ICD-10-CM

## 2013-07-21 DIAGNOSIS — B9689 Other specified bacterial agents as the cause of diseases classified elsewhere: Principal | ICD-10-CM

## 2013-07-21 DIAGNOSIS — J988 Other specified respiratory disorders: Secondary | ICD-10-CM

## 2013-07-21 MED ORDER — AZITHROMYCIN 250 MG PO TABS: ORAL_TABLET | ORAL | Status: DC

## 2013-07-21 MED ORDER — PREDNISONE 20 MG PO TABS: ORAL_TABLET | ORAL | Status: DC

## 2013-07-21 NOTE — Progress Notes (Signed)
Patient ID: CATINA NUSS MRN: [id number], DOB: 1965-07-14, 48 y.o. Date of Encounter: 07/21/2013, 8:32 AM    Chief Complaint:  Chief Complaint  Patient presents with  . fever, congestion, cough, SOB     HPI: 48 y.o. year old white female reports that this started with throat feeling scratchy and her head feeling congested and with pressure. Says then it began to spread into her chest and she has been having very congested cough. Also has had increased congestion through her head and hoarseness/scrathiness/congestion in her throat. Last night had fever. Is on ibuprofen now which is keeping her fever down. She has no history of asthma and does not smoke. 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 Allergic to penicillin but no other antibiotics.     Home Meds: See attached medication section for any medications that were entered at today's visit. The computer does not put those onto this list.The following list is a list of meds entered prior to today's visit.   Current Outpatient Prescriptions on File Prior to Visit  Medication Sig Dispense Refill  . escitalopram (LEXAPRO) 10 MG tablet Take 1 tablet (10 mg total) by mouth daily.  30 tablet  3  . HYDROcodone-acetaminophen (NORCO/VICODIN) 5-325 MG per tablet Take 1-2 tablets by mouth every 4 (four) hours as needed for moderate pain.  15 tablet  0  . ondansetron (ZOFRAN ODT) 4 MG disintegrating tablet Take 1 tablet (4 mg total) by mouth every 8 (eight) hours as needed for nausea or vomiting.  20 tablet  0  . promethazine (PHENERGAN) 25 MG tablet Take 25 mg by mouth every 6 (six) hours as needed for nausea or vomiting.       No current facility-administered medications on file prior to visit.    Allergies:  Allergies  Allergen Reactions  . Penicillins Hives      Review of Systems: See HPI for pertinent ROS. All other ROS negative.    Physical Exam: Blood pressure 116/70, pulse 76, temperature 98.5 F (36.9 C),  temperature source Oral, resp. rate 18, weight 196 lb (88.905 kg)., Body mass index is 32.11 kg/(m^2). General: WNWD WF.  Appears in no acute distress. Sounds extremely congested when she talks. HEENT: Normocephalic, atraumatic, eyes without discharge, sclera non-icteric, nares are without discharge. Bilateral auditory canals clear, TM's are without perforation, pearly grey and translucent with reflective cone of light bilaterally. Oral cavity moist, posterior pharynx without exudate, erythema, peritonsillar abscess. Mild tenderness with percussion of frontal and maxillary sinuses bilaterally.  Neck: Supple. No thyromegaly. No lymphadenopathy. Lungs: Clear bilaterally to auscultation without wheezes, rales, or rhonchi. Breathing is unlabored. Heart: Regular rhythm. No murmurs, rubs, or gallops. Msk:  Strength and tone normal for age. Extremities/Skin: Warm and dry.  No rashes . Neuro: Alert and oriented X 3. Moves all extremities spontaneously. Gait is normal. CNII-XII grossly in tact. Psych:  Responds to questions appropriately with a normal affect.     ASSESSMENT AND PLAN:  48 y.o. year old female with  1. Bacterial respiratory infection - azithromycin (ZITHROMAX) 250 MG tablet; Day 1: Take 2 daily.  Days 2-5: Take 1 daily.  Dispense: 6 tablet; Refill: 0 - predniSONE (DELTASONE) 20 MG tablet; Take 3 daily for 2 days, then 2 daily for 2 days, then 1 daily for 2 days.  Dispense: 12 tablet; Refill: 0 Start the above medications immediately. Complete-as directed. Write her a note for out of work for today and tomorrow. F/ up with  me if symptoms are not improved by tomorrow afternoon or if fever has not resolved. Also followup if symptoms do not completely resolve within one week after completion of medicines.  Marin Olp [place], [place], [facility] 07/21/2013 8:32 AM

## 2014-04-04 ENCOUNTER — Emergency Department (HOSPITAL_COMMUNITY): Payer: PRIVATE HEALTH INSURANCE

## 2014-04-04 ENCOUNTER — Encounter (HOSPITAL_COMMUNITY): Payer: Self-pay | Admitting: Vascular Surgery

## 2014-04-04 ENCOUNTER — Emergency Department (HOSPITAL_COMMUNITY)
Admission: EM | Admit: 2014-04-04 | Discharge: 2014-04-05 | Disposition: A | Discharge status: Home / Self Care | Payer: PRIVATE HEALTH INSURANCE | Attending: Emergency Medicine | Admitting: Emergency Medicine

## 2014-04-04 DIAGNOSIS — Z7952 Long term (current) use of systemic steroids: Secondary | ICD-10-CM | POA: Diagnosis not present

## 2014-04-04 DIAGNOSIS — I10 Essential (primary) hypertension: Secondary | ICD-10-CM | POA: Insufficient documentation

## 2014-04-04 DIAGNOSIS — Z79899 Other long term (current) drug therapy: Secondary | ICD-10-CM | POA: Diagnosis not present

## 2014-04-04 DIAGNOSIS — R001 Bradycardia, unspecified: Secondary | ICD-10-CM

## 2014-04-04 DIAGNOSIS — F419 Anxiety disorder, unspecified: Secondary | ICD-10-CM | POA: Diagnosis not present

## 2014-04-04 DIAGNOSIS — R079 Chest pain, unspecified: Secondary | ICD-10-CM | POA: Insufficient documentation

## 2014-04-04 DIAGNOSIS — R0602 Shortness of breath: Secondary | ICD-10-CM | POA: Diagnosis not present

## 2014-04-04 DIAGNOSIS — R110 Nausea: Secondary | ICD-10-CM | POA: Insufficient documentation

## 2014-04-04 DIAGNOSIS — Z880 Allergy status to penicillin: Secondary | ICD-10-CM | POA: Insufficient documentation

## 2014-04-04 DIAGNOSIS — F329 Major depressive disorder, single episode, unspecified: Secondary | ICD-10-CM | POA: Insufficient documentation

## 2014-04-04 DIAGNOSIS — Z872 Personal history of diseases of the skin and subcutaneous tissue: Secondary | ICD-10-CM | POA: Insufficient documentation

## 2014-04-04 LAB — CBC
HCT: 40.6 % (ref 36.0–46.0)
Hemoglobin: 13.5 g/dL (ref 12.0–15.0)
MCH: 29 pg (ref 26.0–34.0)
MCHC: 33.3 g/dL (ref 30.0–36.0)
MCV: 87.1 fL (ref 78.0–100.0)
Platelets: 283 10*3/uL (ref 150–400)
RBC: 4.66 MIL/uL (ref 3.87–5.11)
RDW: 13.5 % (ref 11.5–15.5)
WBC: 9.2 10*3/uL (ref 4.0–10.5)

## 2014-04-04 LAB — COMPREHENSIVE METABOLIC PANEL
ALT: 39 U/L — ABNORMAL HIGH (ref 0–35)
ANION GAP: 9 (ref 5–15)
AST: 32 U/L (ref 0–37)
Albumin: 3.8 g/dL (ref 3.5–5.2)
Alkaline Phosphatase: 123 U/L — ABNORMAL HIGH (ref 39–117)
BILIRUBIN TOTAL: 0.6 mg/dL (ref 0.3–1.2)
BUN: 9 mg/dL (ref 6–23)
CO2: 25 mmol/L (ref 19–32)
Calcium: 9.4 mg/dL (ref 8.4–10.5)
Chloride: 105 mmol/L (ref 96–112)
Creatinine, Ser: 0.57 mg/dL (ref 0.50–1.10)
GFR calc non Af Amer: >90 mL/min (ref >90)
Glucose, Bld: 92 mg/dL (ref 70–99)
Potassium: 4.1 mmol/L (ref 3.5–5.1)
Sodium: 139 mmol/L (ref 135–145)
Total Protein: 7.2 g/dL (ref 6.0–8.3)

## 2014-04-04 LAB — I-STAT TROPONIN, ED
TROPONIN I, POC: 0 ng/mL (ref 0.00–0.08)
TROPONIN I, POC: 0 ng/mL (ref 0.00–0.08)

## 2014-04-04 MED ORDER — OXYCODONE-ACETAMINOPHEN 5-325 MG PO TABS
1.0000 | ORAL_TABLET | Freq: Once | ORAL | Status: AC
  Administered 2014-04-04: 1 via ORAL
  Filled 2014-04-04: qty 1

## 2014-04-04 MED ORDER — SODIUM CHLORIDE 0.9 % IV BOLUS (SEPSIS)
1000.0000 mL | Freq: Once | INTRAVENOUS | Status: AC
  Administered 2014-04-04: 1000 mL via INTRAVENOUS

## 2014-04-04 NOTE — Discharge Instructions (Signed)

## 2014-04-04 NOTE — ED Notes (Addendum)
While ambulating, pt complained of being dizzy, weak, and stated her pain is still at a 8 out of 10. No other complaints.

## 2014-04-04 NOTE — ED Provider Notes (Signed)
CSN: [id number]     Arrival date & time 04/04/14  1856 History   First MD Initiated Contact with Patient 04/04/14 2012     Chief Complaint  Patient presents with  . Chest Pain     Patient is a 49 y.o. female presenting with chest pain. The history is provided by the patient.  Chest Pain Pain location:  Substernal area Pain quality: dull   Pain radiates to:  L arm Pain severity:  Moderate Onset quality:  Gradual Duration:  2 days Timing:  Constant Progression since onset: slight worsening earlier today. Chronicity:  New Relieved by:  Nothing Worsened by:  Nothing tried Associated symptoms: nausea and shortness of breath   Associated symptoms: no fever and no syncope   Patient reports dull CP for past 2 days It has been constant with some worsening earlier today She reports mild SOB Denies pleuritic pain (told nurse she had pain with deep breathing but denied on my evaluation) She also reports dyspnea on exertion She has taken ASA/NTG which did not help pain  She denies h/o CAD/PE/DVT  Family history - positive for CAD  Past Medical History  Diagnosis Date  . Depression   . Hypertension   . Anxiety   . Rosacea    Past Surgical History  Procedure Laterality Date  . Appendectomy    . Cholecystectomy    . Tonsillectomy    . Lumbar      ruptured disc in lumbar taken out  . Back surgery    . Appendectomy    . Cholecystectomy    . Abdominal hysterectomy      non cancerous   Family History  Problem Relation Age of Onset  . Diabetes Father   . Heart disease Father 40  . Hypertension Father   . Cancer Maternal Grandfather     colon cancer (70's)  . Diabetes Paternal Grandmother   . Diabetes Paternal Grandfather   . Cancer Cousin     breast   History  Substance Use Topics  . Smoking status: Never Smoker   . Smokeless tobacco: Never Used  . Alcohol Use: No   OB History    No data available     Review of Systems  Constitutional: Negative for fever.   Respiratory: Positive for shortness of breath.   Cardiovascular: Positive for chest pain. Negative for syncope.  Gastrointestinal: Positive for nausea.  Neurological: Negative for syncope.  All other systems reviewed and are negative.     Allergies  Penicillins  Home Medications   Prior to Admission medications   Medication Sig Start Date End Date Taking? Authorizing Provider  atenolol (TENORMIN) 50 MG tablet Take 50 mg by mouth daily.   Yes Historical Provider, MD  azithromycin (ZITHROMAX) 250 MG tablet Day 1: Take 2 daily.  Days 2-5: Take 1 daily. Patient not taking: Reported on 04/04/2014 07/21/13   Orlena Sheldon, PA-C  escitalopram (LEXAPRO) 10 MG tablet Take 1 tablet (10 mg total) by mouth daily. Patient not taking: Reported on 04/04/2014 01/06/13   [name], MD  HYDROcodone-acetaminophen (NORCO/VICODIN) 5-325 MG per tablet Take 1-2 tablets by mouth every 4 (four) hours as needed for moderate pain. Patient not taking: Reported on 04/04/2014 05/23/13   Radene Gunning, NP  ondansetron (ZOFRAN ODT) 4 MG disintegrating tablet Take 1 tablet (4 mg total) by mouth every 8 (eight) hours as needed for nausea or vomiting. Patient not taking: Reported on 04/04/2014 06/10/13   Alycia Rossetti, MD  predniSONE (DELTASONE) 20 MG tablet Take 3 daily for 2 days, then 2 daily for 2 days, then 1 daily for 2 days. Patient not taking: Reported on 04/04/2014 07/21/13   Orlena Sheldon, PA-C   BP 119/74 mmHg  Pulse 53  Temp(Src) 97.9 F (36.6 C) (Oral)  Resp 13  SpO2 97% Physical Exam CONSTITUTIONAL: Well developed/well nourished HEAD: Normocephalic/atraumatic EYES: EOMI/PERRL ENMT: Mucous membranes moist NECK: supple no meningeal signs SPINE/BACK:entire spine nontender CV: S1/S2 noted, no murmurs/rubs/gallops noted LUNGS: Lungs are clear to auscultation bilaterally, no apparent distress ABDOMEN: soft, nontender, no rebound or guarding, bowel sounds noted throughout abdomen GU:no cva  tenderness NEURO: Pt is awake/alert/appropriate, moves all extremitiesx4.  No facial droop.   EXTREMITIES: pulses normal/equal, full ROM, no calf tenderness, no LE edema SKIN: warm, color normal PSYCH: no abnormalities of mood noted, alert and oriented to situation  ED Course  Procedures   9:47 PM Pt with constant CP for 2 days She is well appearing HEART score at 3 (moderate suspicion, age, risk factors) Will monitor and repeat troponin/ekg at 3 hr mark 11:40 PM Pt well appearing Repeat troponin negative On exam, she has some reproducible CP Given constant pain present for over 2 days with negative troponin I doubt ACS.  I have low suspicion for unstable angina.  She reports her fatigue recently started after taking atenolol.  She just started this medication.  I advised her to hold this medication and call her PCP in 2 days We discussed strict return precautions Doubt PE as pain described dull.   BP 105/60 mmHg  Pulse 54  Temp(Src) 97.9 F (36.6 C) (Oral)  Resp 19  SpO2 99%  Labs Review Labs Reviewed  COMPREHENSIVE METABOLIC PANEL - Abnormal; Notable for the following:    ALT 39 (*)    Alkaline Phosphatase 123 (*)    All other components within normal limits  CBC  I-STAT TROPOININ, ED  I-STAT TROPOININ, ED    Imaging Review Dg Chest 2 View  04/04/2014   CLINICAL DATA:  Mid chest pain shortness of breath 2 days.  EXAM: CHEST  2 VIEW  COMPARISON:  08/19/2012  FINDINGS: The heart size and mediastinal contours are within normal limits. Both lungs are clear. The visualized skeletal structures are unremarkable.  IMPRESSION: No active cardiopulmonary disease.   Electronically Signed   By: Kathreen Devoid   On: 04/04/2014 19:49     EKG Interpretation   Date/Time:  Saturday April 04 2014 19:00:44 EST Ventricular Rate:  57 PR Interval:  139 QRS Duration: 92 QT Interval:  405 QTC Calculation: 394 R Axis:   60 Text Interpretation:  Sinus rhythm artifact noted ECG OTHERWISE  WITHIN  NORMAL LIMITS Confirmed by Christy Gentles  MD, [place] ([id number]) on 04/04/2014  7:07:07 PM      EKG Interpretation  Date/Time:  Saturday April 04 2014 22:25:59 EST Ventricular Rate:  52 PR Interval:  144 QRS Duration: 93 QT Interval:  451 QTC Calculation: 419 R Axis:   66 Text Interpretation:  Sinus rhythm Normal ECG No significant change since last tracing Confirmed by Christy Gentles  MD, Baylen Buckner ([id number]) on 04/04/2014 10:39:58 PM       Medications  oxyCODONE-acetaminophen (PERCOCET/ROXICET) 5-325 MG per tablet 1 tablet (1 tablet Oral Given 04/04/14 2048)  oxyCODONE-acetaminophen (PERCOCET/ROXICET) 5-325 MG per tablet 1 tablet (1 tablet Oral Given 04/04/14 2222)  sodium chloride 0.9 % bolus 1,000 mL (1,000 mLs Intravenous New Bag/Given 04/04/14 2313)    MDM   Final diagnoses:  Chest pain, unspecified chest pain type  Bradycardia    Nursing notes including past medical history and social history reviewed and considered in documentation xrays/imaging reviewed by myself and considered during evaluation Labs/vital reviewed myself and considered during evaluation     Sharyon Cable, MD 04/04/14 2344

## 2014-04-04 NOTE — ED Notes (Signed)
Pt reports to the ED for eval of midsternal CP that radiates into her left arm. Reports she has had a dull CP for the past several days however today while at work it became increasingly worse. Pt reports asssociated symptoms of SOB with exertion, nausea, lightheadedness, dizziness, and diaphoresis. Pt describes the pain as a "pressure." she had 1 nitro and 324 of ASA PTA. Nitro did not help her pain. Denies any active vomiting. Pain is worse with deep inspiration. She was recently started on Atenolol for HTN first dose was yesterday morning. 12 lead shows siinus brady to NSR. Pt A&Ox4, resp e/u, and skin warm and dry.

## 2014-04-13 ENCOUNTER — Encounter: Payer: Self-pay | Admitting: Family Medicine

## 2014-04-13 ENCOUNTER — Ambulatory Visit (INDEPENDENT_AMBULATORY_CARE_PROVIDER_SITE_OTHER): Payer: PRIVATE HEALTH INSURANCE | Admitting: Family Medicine

## 2014-04-13 VITALS — BP 136/92 | HR 84 | Temp 98.4°F | Resp 20 | Ht 66.5 in | Wt 204.0 lb

## 2014-04-13 DIAGNOSIS — F418 Other specified anxiety disorders: Secondary | ICD-10-CM

## 2014-04-13 DIAGNOSIS — R0789 Other chest pain: Secondary | ICD-10-CM

## 2014-04-13 DIAGNOSIS — I10 Essential (primary) hypertension: Secondary | ICD-10-CM

## 2014-04-13 MED ORDER — ALPRAZOLAM 0.5 MG PO TABS: 0.5000 mg | ORAL_TABLET | Freq: Three times a day (TID) | ORAL | Status: DC | PRN

## 2014-04-13 MED ORDER — AMLODIPINE BESYLATE 10 MG PO TABS: 10.0000 mg | ORAL_TABLET | Freq: Every day | ORAL | Status: DC

## 2014-04-13 NOTE — Progress Notes (Signed)
Subjective:    Patient ID: [name], female    DOB: Feb 11, 1966, 49 y.o.   MRN: [id number]  HPI 34 weeks ago, the patient started developing substernal chest pain and chest pressure. Recently the pain radiated into her left arm. She went to the emergency room where cardiac enzymes were negative 2. Chest x-ray was normal. EKG which I reviewed showed normal sinus rhythm with no evidence of ischemia or infarction. She has developed hypertension. She cannot tolerate atenolol due to bradycardia. She denies any acid reflux. She denies any indigestion. She denies any nausea or melena. She has been getting daily headaches for the last 3-4 weeks. They're located superior to the eyes in the frontal scalp. They can be pulsatile at times. There is photophobia and nausea associated with them. There is no phonophobia. She does have a past medical history of migraines. Ironically all of her symptoms began a proximally 4 weeks ago when she developed severe stress in her personal life in dealing with her son. She will not discuss further but she does believe some of her symptoms could be due to anxiety. Past Medical History  Diagnosis Date  . Depression   . Hypertension   . Anxiety   . Rosacea    Past Surgical History  Procedure Laterality Date  . Appendectomy    . Cholecystectomy    . Tonsillectomy    . Lumbar      ruptured disc in lumbar taken out  . Back surgery    . Appendectomy    . Cholecystectomy    . Abdominal hysterectomy      non cancerous   Current Outpatient Prescriptions on File Prior to Visit  Medication Sig Dispense Refill  . atenolol (TENORMIN) 50 MG tablet Take 50 mg by mouth daily.    Marland Kitchen escitalopram (LEXAPRO) 10 MG tablet Take 1 tablet (10 mg total) by mouth daily. (Patient not taking: Reported on 04/04/2014) 30 tablet 3   No current facility-administered medications on file prior to visit.   Allergies  Allergen Reactions  . Penicillins Hives   History   Social History    . Marital Status: Divorced    Spouse Name: N/A    Number of Children: N/A  . Years of Education: N/A   Occupational History  . Not on file.   Social History Main Topics  . Smoking status: Never Smoker   . Smokeless tobacco: Never Used  . Alcohol Use: No  . Drug Use: No  . Sexual Activity: Not on file   Other Topics Concern  . Not on file   Social History Narrative      Review of Systems  All other systems reviewed and are negative.      Objective:   Physical Exam  Constitutional: She is oriented to person, place, and time. She appears well-developed and well-nourished.  HENT:  Head: Normocephalic and atraumatic.  Right Ear: External ear normal.  Left Ear: External ear normal.  Nose: Nose normal.  Mouth/Throat: Oropharynx is clear and moist. No oropharyngeal exudate.  Eyes: Conjunctivae and EOM are normal. Pupils are equal, round, and reactive to light.  Cardiovascular: Normal rate, regular rhythm, normal heart sounds and intact distal pulses.  Exam reveals no gallop and no friction rub.   No murmur heard. Pulmonary/Chest: Effort normal and breath sounds normal. No respiratory distress. She has no wheezes. She has no rales. She exhibits no tenderness.  Abdominal: Soft. Bowel sounds are normal. She exhibits no distension and no  mass. There is no tenderness. There is no rebound and no guarding.  Musculoskeletal: She exhibits no edema.  Neurological: She is alert and oriented to person, place, and time. She has normal reflexes. She displays normal reflexes. No cranial nerve deficit. She exhibits normal muscle tone. Coordination normal.  Vitals reviewed.         Assessment & Plan:  Benign essential HTN - Plan: amLODipine (NORVASC) 10 MG tablet  Situational anxiety - Plan: ALPRAZolam (XANAX) 0.5 MG tablet  Atypical chest pain - Plan: Ambulatory referral to Cardiology  I reviewed her ER records including her EKG, her chest x-ray, and her lab work. I believe it is  very unlikely that this is cardiac in nature. There is the possibility for coronary artery vasospasm and therefore I will start the patient on amlodipine 10 mg by mouth daily to treat possible vasospasms as well as to treat her blood pressure. However I believe this is most likely anxiety attacks. I believe the patient is also possibly having anxiety triggered migraines. Therefore I'll start the patient on Xanax 0.5 mg by mouth every 8 hours when necessary anxiety. I will schedule the patient to see a cardiologist for an exercise treadmill test to rule out coronary ischemia. If the treadmill test is normal and her symptoms improve on Xanax, we may need to discuss long-term treatment options for anxiety.

## 2014-04-14 ENCOUNTER — Ambulatory Visit: Payer: PRIVATE HEALTH INSURANCE | Admitting: Family Medicine

## 2014-04-29 ENCOUNTER — Emergency Department (HOSPITAL_COMMUNITY)
Admission: EM | Admit: 2014-04-29 | Discharge: 2014-04-29 | Disposition: A | Discharge status: Home / Self Care | Payer: PRIVATE HEALTH INSURANCE | Attending: Emergency Medicine | Admitting: Emergency Medicine

## 2014-04-29 ENCOUNTER — Emergency Department (HOSPITAL_COMMUNITY): Payer: PRIVATE HEALTH INSURANCE

## 2014-04-29 ENCOUNTER — Telehealth: Payer: Self-pay | Admitting: Family Medicine

## 2014-04-29 ENCOUNTER — Encounter (HOSPITAL_COMMUNITY): Payer: Self-pay | Admitting: Emergency Medicine

## 2014-04-29 DIAGNOSIS — F329 Major depressive disorder, single episode, unspecified: Secondary | ICD-10-CM | POA: Diagnosis not present

## 2014-04-29 DIAGNOSIS — Z79899 Other long term (current) drug therapy: Secondary | ICD-10-CM | POA: Insufficient documentation

## 2014-04-29 DIAGNOSIS — F419 Anxiety disorder, unspecified: Secondary | ICD-10-CM | POA: Insufficient documentation

## 2014-04-29 DIAGNOSIS — Z880 Allergy status to penicillin: Secondary | ICD-10-CM | POA: Diagnosis not present

## 2014-04-29 DIAGNOSIS — R05 Cough: Secondary | ICD-10-CM | POA: Diagnosis not present

## 2014-04-29 DIAGNOSIS — I10 Essential (primary) hypertension: Secondary | ICD-10-CM | POA: Diagnosis not present

## 2014-04-29 DIAGNOSIS — R110 Nausea: Secondary | ICD-10-CM | POA: Diagnosis not present

## 2014-04-29 DIAGNOSIS — R0602 Shortness of breath: Secondary | ICD-10-CM

## 2014-04-29 DIAGNOSIS — J3489 Other specified disorders of nose and nasal sinuses: Secondary | ICD-10-CM | POA: Insufficient documentation

## 2014-04-29 DIAGNOSIS — R202 Paresthesia of skin: Secondary | ICD-10-CM | POA: Insufficient documentation

## 2014-04-29 DIAGNOSIS — R0789 Other chest pain: Secondary | ICD-10-CM | POA: Insufficient documentation

## 2014-04-29 DIAGNOSIS — R079 Chest pain, unspecified: Secondary | ICD-10-CM | POA: Diagnosis present

## 2014-04-29 DIAGNOSIS — Z872 Personal history of diseases of the skin and subcutaneous tissue: Secondary | ICD-10-CM | POA: Diagnosis not present

## 2014-04-29 LAB — CBC
HCT: 35 % — ABNORMAL LOW (ref 36.0–46.0)
Hemoglobin: 11.7 g/dL — ABNORMAL LOW (ref 12.0–15.0)
MCH: 28.7 pg (ref 26.0–34.0)
MCHC: 33.4 g/dL (ref 30.0–36.0)
MCV: 85.8 fL (ref 78.0–100.0)
PLATELETS: 240 10*3/uL (ref 150–400)
RBC: 4.08 MIL/uL (ref 3.87–5.11)
RDW: 13.2 % (ref 11.5–15.5)
WBC: 5.8 10*3/uL (ref 4.0–10.5)

## 2014-04-29 LAB — BASIC METABOLIC PANEL
Anion gap: 9 (ref 5–15)
BUN: 9 mg/dL (ref 6–23)
CO2: 27 mmol/L (ref 19–32)
CREATININE: 0.6 mg/dL (ref 0.50–1.10)
Calcium: 8.8 mg/dL (ref 8.4–10.5)
Chloride: 104 mmol/L (ref 96–112)
GLUCOSE: 89 mg/dL (ref 70–99)
Potassium: 3.6 mmol/L (ref 3.5–5.1)
Sodium: 140 mmol/L (ref 135–145)

## 2014-04-29 LAB — I-STAT TROPONIN, ED
Troponin i, poc: 0 ng/mL (ref 0.00–0.08)
Troponin i, poc: 0 ng/mL (ref 0.00–0.08)

## 2014-04-29 MED ORDER — MORPHINE SULFATE 4 MG/ML IJ SOLN
4.0000 mg | Freq: Once | INTRAMUSCULAR | Status: AC
  Administered 2014-04-29: 4 mg via INTRAVENOUS
  Filled 2014-04-29: qty 1

## 2014-04-29 MED ORDER — OMEPRAZOLE 20 MG PO CPDR: 20.0000 mg | DELAYED_RELEASE_CAPSULE | Freq: Two times a day (BID) | ORAL | Status: DC

## 2014-04-29 MED ORDER — PROMETHAZINE HCL 25 MG/ML IJ SOLN
25.0000 mg | Freq: Once | INTRAMUSCULAR | Status: AC
  Administered 2014-04-29: 25 mg via INTRAVENOUS
  Filled 2014-04-29: qty 1

## 2014-04-29 MED ORDER — GI COCKTAIL ~~LOC~~
30.0000 mL | Freq: Once | ORAL | Status: AC
  Administered 2014-04-29: 30 mL via ORAL
  Filled 2014-04-29: qty 30

## 2014-04-29 MED ORDER — KETOROLAC TROMETHAMINE 30 MG/ML IJ SOLN
30.0000 mg | Freq: Once | INTRAMUSCULAR | Status: AC
  Administered 2014-04-29: 30 mg via INTRAVENOUS
  Filled 2014-04-29: qty 1

## 2014-04-29 NOTE — Telephone Encounter (Signed)
Called pt and she states that all the test that the hospital has done have been negative and would like to know what to do about her SOB and CP until she sees the cardiologist? Told pt to discuss that with the ER MD and appt made for her to follow up with Dr. Dennard Schaumann

## 2014-04-29 NOTE — ED Provider Notes (Signed)
CSN: [id number]     Arrival date & time 04/29/14  1247 History   First MD Initiated Contact with Patient 04/29/14 1250     Chief Complaint  Patient presents with  . Chest Pain     (Consider location/radiation/quality/duration/timing/severity/associated sxs/prior Treatment) HPI Comments: [name] is a 49 y.o. 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 She reports that she has had intermittent symptoms over the last few weeks, and again today she developed intermittent 8/10 left-sided chest pressure/tightness which is radiating into the left shoulder/behind shoulder blade and left arm, worse with activity, and unrelieved with aspirin and 2 nitroglycerin given en route. She reports that she had associated nausea which has been unrelieved with Zofran. Additionally she reports that she's had shortness of breath with activity over the last 4 days and a dry cough she developed yesterday along with clear rhinorrhea. She endorses associated left arm tingling today which is also a recurrent symptom from the last time that she had chest pain. Also reports some indigestion and belching today. She was seen in the ER on 04/04/14 and had a negative workup. Given her family history of CAD she was referred to her primary care doctor for a stress test, which is currently in the process of being scheduled. She denies any fevers, chills, leg swelling, wheezing, recent travel/immobilization/surgery, estrogen use, history of DVT he/PE, hemoptysis, PND, claudication, abdominal pain, vomiting, diarrhea, constipation, dysuria, hematuria, vaginal bleeding or discharge, numbness, or weakness. She denies any IV drug use. Smoking. Does not have a personal medical history of CAD.  Patient is  a 49 y.o. female presenting with chest pain. The history is provided by the patient. No language interpreter was used.  Chest Pain Pain location:  L chest Pain quality: pressure and tightness   Pain radiates to:  L shoulder and L arm Pain radiates to the back: yes (behind shoulder)   Pain severity:  Moderate Onset quality:  Gradual Duration:  3 hours Timing:  Intermittent Progression:  Unchanged Chronicity:  Recurrent Context: at rest   Relieved by:  Nothing Exacerbated by: activity. Ineffective treatments:  Aspirin and nitroglycerin Associated symptoms: cough (dry), heartburn, nausea and shortness of breath (ongoing x4 days)   Associated symptoms: no abdominal pain, no altered mental status, no anxiety, no claudication, no diaphoresis, no dizziness, no dysphagia, no fever, no headache, no lower extremity edema, no near-syncope, no numbness, no orthopnea, no palpitations, no syncope, not vomiting and no weakness   Risk factors: hypertension   Risk factors: no birth control, no coronary artery disease, no diabetes mellitus, no immobilization, no prior DVT/PE, no smoking and no surgery     Past Medical History  Diagnosis Date  . Depression   . Hypertension   . Anxiety   . Rosacea    Past Surgical History  Procedure Laterality Date  . Appendectomy    . Cholecystectomy    . Tonsillectomy    . Lumbar      ruptured disc in lumbar taken out  . Back surgery    . Appendectomy    . Cholecystectomy    . Abdominal hysterectomy      non cancerous   Family History  Problem Relation Age of Onset  . Diabetes Father   .  Heart disease Father 82  . Hypertension Father   . Cancer Maternal Grandfather     colon cancer (70's)  . Diabetes Paternal Grandmother   . Diabetes Paternal Grandfather   . Cancer Cousin     breast   History  Substance Use Topics  . Smoking status: Never Smoker   . Smokeless tobacco: Never Used  . Alcohol Use: No   OB History    No data available      Review of Systems  Constitutional: Negative for fever, chills and diaphoresis.  HENT: Positive for rhinorrhea and sinus pressure. Negative for ear discharge, ear pain, sore throat and trouble swallowing.   Respiratory: Positive for cough (dry) and shortness of breath (ongoing x4 days). Negative for wheezing.   Cardiovascular: Positive for chest pain. Negative for palpitations, orthopnea, claudication, leg swelling, syncope and near-syncope.  Gastrointestinal: Positive for heartburn and nausea. Negative for vomiting, abdominal pain, diarrhea and constipation.       +heartburn and belching  Genitourinary: Negative for dysuria, hematuria, vaginal bleeding and vaginal discharge.  Musculoskeletal: Negative for myalgias and arthralgias.  Skin: Negative for color change.  Neurological: Negative for dizziness, syncope, weakness, light-headedness, numbness and headaches.       +L arm tingling  Psychiatric/Behavioral: Negative for confusion.   10 Systems reviewed and are negative for acute change except as noted in the HPI.    Allergies  Penicillins  Home Medications   Prior to Admission medications   Medication Sig Start Date End Date Taking? Authorizing Provider  ALPRAZolam Duanne Moron) 0.5 MG tablet Take 1 tablet (0.5 mg total) by mouth 3 (three) times daily as needed for anxiety. 04/13/14   [name], MD  amLODipine (NORVASC) 10 MG tablet Take 1 tablet (10 mg total) by mouth daily. 04/13/14   [name], MD  atenolol (TENORMIN) 50 MG tablet Take 50 mg by mouth daily.    Historical Provider, MD  escitalopram (LEXAPRO) 10 MG tablet Take 1 tablet (10 mg total) by mouth daily. Patient not taking: Reported on 04/04/2014 01/06/13   [name], MD   BP 113/75 mmHg  Pulse 75  Temp(Src) 98.3 F (36.8 C) (Oral)  Resp 18  Ht 5\' 7"  (1.702 m)  Wt 200 lb (90.719 kg)  BMI 31.32 kg/m2  SpO2 100% Physical Exam  Constitutional: She is oriented to person, place, and time. Vital signs are  normal. She appears well-developed and well-nourished.  Non-toxic appearance. No distress.  HENT:  Head: Normocephalic and atraumatic.  Mouth/Throat: Oropharynx is clear and moist and mucous membranes are normal.  Eyes: Conjunctivae and EOM are normal. Right eye exhibits no discharge. Left eye exhibits no discharge.  Neck: Normal range of motion. Neck supple.  Cardiovascular: Normal rate, regular rhythm, normal heart sounds and intact distal pulses.  Exam reveals no gallop and no friction rub.   No murmur heard. RRR, nl s1/s2, no m/r/g, distal pulses intact, no pedal edema  Pulmonary/Chest: Effort normal and breath sounds normal. No respiratory distress. She has no decreased breath sounds. She has no wheezes. She has no rhonchi. She has no rales. She exhibits no tenderness, no crepitus and no retraction.  CTAB in all lung fields, no w/r/r, no hypoxia or increased WOB, speaking in full sentences, SpO2 100% on RA No chest wall TTP, no crepitus or retraction  Abdominal: Soft. Normal appearance and bowel sounds are normal. She exhibits no distension. There is no tenderness. There is no rigidity, no rebound, no guarding, no CVA  tenderness, no tenderness at McBurney's point and negative Murphy's sign.  Musculoskeletal: Normal range of motion.  Neurological: She is alert and oriented to person, place, and time. She has normal strength. No sensory deficit.  Skin: Skin is warm, dry and intact. No rash noted.  Psychiatric: She has a normal mood and affect.  Nursing note and vitals reviewed.   ED Course  Procedures (including critical care time) Labs Review Labs Reviewed  CBC - Abnormal; Notable for the following:    Hemoglobin 11.7 (*)    HCT 35.0 (*)    All other components within normal limits  BASIC METABOLIC PANEL  I-STAT TROPOININ, ED  Randolm Idol, ED    Imaging Review Dg Chest Port 1 View  04/29/2014   CLINICAL DATA:  Acute onset chest pain this morning.  Hypertension.  EXAM:  PORTABLE CHEST - 1 VIEW  COMPARISON:  04/04/2014  FINDINGS: The heart size and mediastinal contours are within normal limits. Both lungs are clear. The visualized skeletal structures are unremarkable.  IMPRESSION: No active disease.   Electronically Signed   By: Earle Gell M.D.   On: 04/29/2014 13:56     EKG Interpretation   Date/Time:  Wednesday April 29 2014 13:47:12 EST Ventricular Rate:  75 PR Interval:  142 QRS Duration: 88 QT Interval:  387 QTC Calculation: 432 R Axis:   47 Text Interpretation:  Sinus rhythm Abnormal R-wave progression, early  transition No significant change since last tracing Confirmed by POLLINA   MD, CHRISTOPHER [phone]) on 04/29/2014 3:55:33 PM      MDM   Final diagnoses:  Atypical chest pain  SOB (shortness of breath)    49 y.o. female with recurrent chest pain. Not reproducible on exam. Occurred at rest. Radiating into L arm and shoulder/back. Unrelieved with NTG and ASA. Some indigestion. Will give morphine and GI cocktail now. Is in the process of scheduling cardiac stress test outpt. Will obtain labs and CXR although this doesn't seem cardiac in nature. Will give phenergan now for nausea since zofran en route didn't help. Doubt DVT/PE since no hypoxia or tachycardia, no pleuritic pain. Doubt dissection since equal pulses throughout all extremities. Will reassess shortly.   3:00 PM Nursing reports that she had no relief with morphine, still having CP. Nausea improved. Will give toradol and PO challenge now. Trop neg. CXR WNL. EKG unchanged. CBC with baseline anemia which is unchanged. Awaiting BMP. Will reassess shortly.   3:56 PM Pt resting comfortably upon re-exam, awakens and tells me that morphine didn't help at all, and toradol just given. Nausea improved, tolerating PO well. She is very insistent that she get admitted for a stress test but I explained that with no lab abnormalities or vital sign abnormalities, with low risk HEART score,  admission is not indicated and stress test would still likely be done outpt. She states she has appt on 3/7 with [facility]. Will check trop at 3hr mark (1700) and if neg she will be discharged. Will sign patient over to oncoming resident Sinda Du MD) at shift change. Please see his note for further documentation of care.  BP 100/59 mmHg  Pulse 70  Temp(Src) 98.3 F (36.8 C) (Oral)  Resp 12  Ht 5\' 7"  (1.702 m)  Wt 200 lb (90.719 kg)  BMI 31.32 kg/m2  SpO2 100%  Meds ordered this encounter  Medications  . morphine 4 MG/ML injection 4 mg    Sig:   . promethazine (PHENERGAN) injection 25 mg  Sig:   . gi cocktail (Maalox,Lidocaine,Donnatal)    Sig:   . ketorolac (TORADOL) 30 MG/ML injection 30 mg    Sig:      Patty Sermons Clancy, PA-C 04/29/14 1631  Orpah Greek, MD 04/29/14 [phone]

## 2014-04-29 NOTE — ED Notes (Signed)
Pt in NAD. VSS. Discharge instructions given, pt encouraged to follow up with PCP. Pt discharged in wheelchair

## 2014-04-29 NOTE — ED Notes (Signed)
Per EMS: chest pain sob, started at 1000 this am.  Episode like this 2 weeks ago with no diagnosis, scheduled for stress test.  NSR, 324 asa given.  2 ntg given, 4 zofran given for nausea en route.  Nitroglycerin helped a little but pt started feeling nauseated.  Pain is still 8 or 9/10.

## 2014-04-29 NOTE — ED Notes (Signed)
EDP at bedside  

## 2014-04-29 NOTE — ED Notes (Signed)
EKG not completed within 39mins of arrival, pt placed in hallway and went to bathroom and did not want to get undressed in hallway. Another pt was not out of the room.

## 2014-04-29 NOTE — Discharge Instructions (Signed)
Your chest pain was evaluated here and no findings were revealed for emergent causes of chest pain and shortness of breath. Follow up with your cardiologist at your already scheduled appointment on 05/11/14 for further evaluation and management. Return to the ER for changes or worsening symptoms.   Chest Pain (Nonspecific) It is often hard to give a specific diagnosis for the cause of chest pain. There is always a chance that your pain could be related to something serious, such as a heart attack or a blood clot in the lungs. You need to follow up with your health care provider for further evaluation. CAUSES   Heartburn.  Pneumonia or bronchitis.  Anxiety or stress.  Inflammation around your heart (pericarditis) or lung (pleuritis or pleurisy).  A blood clot in the lung.  A collapsed lung (pneumothorax). It can develop suddenly on its own (spontaneous pneumothorax) or from trauma to the chest.  Shingles infection (herpes zoster virus). The chest wall is composed of bones, muscles, and cartilage. Any of these can be the source of the pain.  The bones can be bruised by injury.  The muscles or cartilage can be strained by coughing or overwork.  The cartilage can be affected by inflammation and become sore (costochondritis). DIAGNOSIS  Lab tests or other studies may be needed to find the cause of your pain. Your health care provider may have you take a test called an ambulatory electrocardiogram (ECG). An ECG records your heartbeat patterns over a 24-hour period. You may also have other tests, such as:  Transthoracic echocardiogram (TTE). During echocardiography, sound waves are used to evaluate how blood flows through your heart.  Transesophageal echocardiogram (TEE).  Cardiac monitoring. This allows your health care provider to monitor your heart rate and rhythm in real time.  Holter monitor. This is a portable device that records your heartbeat and can help diagnose heart  arrhythmias. It allows your health care provider to track your heart activity for several days, if needed.  Stress tests by exercise or by giving medicine that makes the heart beat faster. TREATMENT   Treatment depends on what may be causing your chest pain. Treatment may include:  Acid blockers for heartburn.  Anti-inflammatory medicine.  Pain medicine for inflammatory conditions.  Antibiotics if an infection is present.  You may be advised to change lifestyle habits. This includes stopping smoking and avoiding alcohol, caffeine, and chocolate.  You may be advised to keep your head raised (elevated) when sleeping. This reduces the chance of acid going backward from your stomach into your esophagus. Most of the time, nonspecific chest pain will improve within 2-3 days with rest and mild pain medicine.  HOME CARE INSTRUCTIONS   If antibiotics were prescribed, take them as directed. Finish them even if you start to feel better.  For the next few days, avoid physical activities that bring on chest pain. Continue physical activities as directed.  Do not use any tobacco products, including cigarettes, chewing tobacco, or electronic cigarettes.  Avoid drinking alcohol.  Only take medicine as directed by your health care provider.  Follow your health care provider's suggestions for further testing if your chest pain does not go away.  Keep any follow-up appointments you made. If you do not go to an appointment, you could develop lasting (chronic) problems with pain. If there is any problem keeping an appointment, call to reschedule. SEEK MEDICAL CARE IF:   Your chest pain does not go away, even after treatment.  You have a rash  with blisters on your chest.  You have a fever. SEEK IMMEDIATE MEDICAL CARE IF:   You have increased chest pain or pain that spreads to your arm, neck, jaw, back, or abdomen.  You have shortness of breath.  You have an increasing cough, or you cough up  blood.  You have severe back or abdominal pain.  You feel nauseous or vomit.  You have severe weakness.  You faint.  You have chills. This is an emergency. Do not wait to see if the pain will go away. Get medical help at once. Call your local emergency services (911 in [place] Do not drive yourself to the hospital. MAKE SURE YOU:   Understand these instructions.  Will watch your condition.  Will get help right away if you are not doing well or get worse. Document Released: 11/30/2004 Document Revised: 02/25/2013 Document Reviewed: 09/26/2007 [facility] Patient Information 2015 [place], [place]. This information is not intended to replace advice given to you by your health care provider. Make sure you discuss any questions you have with your health care provider.  Shortness of Breath Shortness of breath means you have trouble breathing. It could also mean that you have a medical problem. You should get immediate medical care for shortness of breath. CAUSES   Not enough oxygen in the air such as with high altitudes or a smoke-filled room.  Certain lung diseases, infections, or problems.  Heart disease or conditions, such as angina or heart failure.  Low red blood cells (anemia).  Poor physical fitness, which can cause shortness of breath when you exercise.  Chest or back injuries or stiffness.  Being overweight.  Smoking.  Anxiety, which can make you feel like you are not getting enough air. DIAGNOSIS  Serious medical problems can often be found during your physical exam. Tests may also be done to determine why you are having shortness of breath. Tests may include:  Chest X-rays.  Lung function tests.  Blood tests.  An electrocardiogram (ECG).  An ambulatory electrocardiogram. An ambulatory ECG records your heartbeat patterns over a 24-hour period.  Exercise testing.  A transthoracic echocardiogram (TTE). During echocardiography, sound waves are used to evaluate  how blood flows through your heart.  A transesophageal echocardiogram (TEE).  Imaging scans. Your health care provider may not be able to find a cause for your shortness of breath after your exam. In this case, it is important to have a follow-up exam with your health care provider as directed.  TREATMENT  Treatment for shortness of breath depends on the cause of your symptoms and can vary greatly. HOME CARE INSTRUCTIONS   Do not smoke. Smoking is a common cause of shortness of breath. If you smoke, ask for help to quit.  Avoid being around chemicals or things that may bother your breathing, such as paint fumes and dust.  Rest as needed. Slowly resume your usual activities.  If medicines were prescribed, take them as directed for the full length of time directed. This includes oxygen and any inhaled medicines.  Keep all follow-up appointments as directed by your health care provider. SEEK MEDICAL CARE IF:   Your condition does not improve in the time expected.  You have a hard time doing your normal activities even with rest.  You have any new symptoms. SEEK IMMEDIATE MEDICAL CARE IF:   Your shortness of breath gets worse.  You feel light-headed, faint, or develop a cough not controlled with medicines.  You start coughing up blood.  You  have pain with breathing.  You have chest pain or pain in your arms, shoulders, or abdomen.  You have a fever.  You are unable to walk up stairs or exercise the way you normally do. MAKE SURE YOU:  Understand these instructions.  Will watch your condition.  Will get help right away if you are not doing well or get worse. Document Released: 11/15/2000 Document Revised: 02/25/2013 Document Reviewed: 05/08/2011 [facility] Patient Information 2015 [place], [place]. This information is not intended to replace advice given to you by your health care provider. Make sure you discuss any questions you have with your health care  provider.  Exercise Stress Electrocardiogram An exercise stress electrocardiogram is a test to check how blood flows to your heart. It is done to find areas of poor blood flow. You will need to walk on a treadmill for this test. The electrocardiogram will record your heartbeat when you are at rest and when you are exercising. BEFORE THE PROCEDURE  Do not have drinks with caffeine or foods with caffeine for 24 hours before the test, or as told by your doctor. This includes coffee, tea (even decaf tea), sodas, chocolate, and cocoa.  Follow your doctor's instructions about eating and drinking before the test.  Ask your doctor what medicines you should or should not take before the test. Take your medicines with water unless told by your doctor not to.  If you use an inhaler, bring it with you to the test.  Bring a snack to eat after the test.  Do not  smoke for 4 hours before the test.  Do not put lotions, powders, creams, or oils on your chest before the test.  Wear comfortable shoes and clothing. PROCEDURE  You will have patches put on your chest. Small areas of your chest may need to be shaved. Wires will be connected to the patches.  Your heart rate will be watched while you are resting and while you are exercising.  You will walk on the treadmill. The treadmill will slowly get faster to raise your heart rate.  The test will take about 1-2 hours. AFTER THE PROCEDURE  Your heart rate and blood pressure will be watched after the test.  You may return to your normal diet, activities, and medicines or as told by your doctor. Document Released: 08/09/2007 Document Revised: 07/07/2013 Document Reviewed: 10/28/2012 [facility] Patient Information 2015 [place], [place]. This information is not intended to replace advice given to you by your health care provider. Make sure you discuss any questions you have with your health care provider.

## 2014-04-29 NOTE — ED Provider Notes (Signed)
I was asked to follow-up on this patient. Spoke with her. Her symptoms persist. Men present for greater than 24 hours now. Normal enzymes. Symptoms may actually be GI. EKG without acute changes and normal troponins. She states she has all appointments for outpatient stress testing. She appropriate for discharge at this time.  Tanna Furry, MD 04/29/14 Drema Halon

## 2014-04-29 NOTE — Telephone Encounter (Signed)
Patient is in hospital for chest pains, is in a lot of pain, would like a call back from you regarding this [phone]

## 2014-05-01 ENCOUNTER — Encounter: Payer: Self-pay | Admitting: Family Medicine

## 2014-05-01 ENCOUNTER — Ambulatory Visit (INDEPENDENT_AMBULATORY_CARE_PROVIDER_SITE_OTHER): Payer: PRIVATE HEALTH INSURANCE | Admitting: Family Medicine

## 2014-05-01 VITALS — BP 110/72 | HR 84 | Temp 98.4°F | Resp 18 | Ht 66.5 in | Wt 208.0 lb

## 2014-05-01 DIAGNOSIS — R0789 Other chest pain: Secondary | ICD-10-CM

## 2014-05-04 ENCOUNTER — Other Ambulatory Visit: Payer: Self-pay

## 2014-05-04 ENCOUNTER — Ambulatory Visit: Payer: PRIVATE HEALTH INSURANCE | Admitting: Cardiology

## 2014-05-04 ENCOUNTER — Encounter: Payer: Self-pay | Admitting: Family Medicine

## 2014-05-04 DIAGNOSIS — Z1231 Encounter for screening mammogram for malignant neoplasm of breast: Secondary | ICD-10-CM

## 2014-05-04 NOTE — Progress Notes (Signed)
Subjective:    Patient ID: [name], female    DOB: December 31, 1965, 49 y.o.   MRN: [id number]  HPI 04/13/14 3-4 weeks ago, the patient started developing substernal chest pain and chest pressure. Recently the pain radiated into her left arm. She went to the emergency room where cardiac enzymes were negative 2. Chest x-ray was normal. EKG which I reviewed showed normal sinus rhythm with no evidence of ischemia or infarction. She has developed hypertension. She cannot tolerate atenolol due to bradycardia. She denies any acid reflux. She denies any indigestion. She denies any nausea or melena. She has been getting daily headaches for the last 3-4 weeks. They're located superior to the eyes in the frontal scalp. They can be pulsatile at times. There is photophobia and nausea associated with them. There is no phonophobia. She does have a past medical history of migraines. Ironically all of her symptoms began a proximally 4 weeks ago when she developed severe stress in her personal life in dealing with her son. She will not discuss further but she does believe some of her symptoms could be due to anxiety.  At that time, my plan was: I reviewed her ER records including her EKG, her chest x-ray, and her lab work. I believe it is very unlikely that this is cardiac in nature. There is the possibility for coronary artery vasospasm and therefore I will start the patient on amlodipine 10 mg by mouth daily to treat possible vasospasms as well as to treat her blood pressure. However I believe this is most likely anxiety attacks. I believe the patient is also possibly having anxiety triggered migraines. Therefore I'll start the patient on Xanax 0.5 mg by mouth every 8 hours when necessary anxiety. I will schedule the patient to see a cardiologist for an exercise treadmill test to rule out coronary ischemia. If the treadmill test is normal and her symptoms improve on Xanax, we may need to discuss long-term treatment  options for anxiety.  05/04/14 Return to the emergency room on February 24 with recurrent chest pain and chest pressure which she associated with exertion. She also states that the chest pain can occur at rest without exertion. There seems to be no true association with exertion. She does report dyspnea on exertion. She is scheduled to see the cardiologist in early March. She does have anxiety but she does not believe this is anxiety. She states the pain feels like a squeezing sensation in the center of her chest. It also radiates into her left shoulder. Today it is reproducible on exam or chest pressure. When I press on the costochondral margin, the patient winces and moans and states that that is the pain she is having. Chest x-ray is unremarkable. I did have the patient walk briskly around my office for 15 minutes. I obtained an EKG immediately after her reentering the building while her heart rate was still greater than 120. EKG reveals sinus tachycardia with junctional ST changes but no overt ischemia. While not a true stress test I was performing this to hopefully give some peace of mind to the patient. Past Medical History  Diagnosis Date  . Depression   . Hypertension   . Anxiety   . Rosacea    Past Surgical History  Procedure Laterality Date  . Appendectomy    . Cholecystectomy    . Tonsillectomy    . Lumbar      ruptured disc in lumbar taken out  . Back surgery    .  Appendectomy    . Cholecystectomy    . Abdominal hysterectomy      non cancerous   Current Outpatient Prescriptions on File Prior to Visit  Medication Sig Dispense Refill  . ALPRAZolam (XANAX) 0.5 MG tablet Take 1 tablet (0.5 mg total) by mouth 3 (three) times daily as needed for anxiety. 30 tablet 0  . amLODipine (NORVASC) 10 MG tablet Take 1 tablet (10 mg total) by mouth daily. 90 tablet 3  . atenolol (TENORMIN) 50 MG tablet Take 50 mg by mouth daily.    Marland Kitchen escitalopram (LEXAPRO) 10 MG tablet Take 1 tablet (10 mg  total) by mouth daily. (Patient not taking: Reported on 04/04/2014) 30 tablet 3  . omeprazole (PRILOSEC) 20 MG capsule Take 1 capsule (20 mg total) by mouth 2 (two) times daily. (Patient not taking: Reported on 05/01/2014) 60 capsule 0   No current facility-administered medications on file prior to visit.   Allergies  Allergen Reactions  . Penicillins Hives   History   Social History  . Marital Status: Divorced    Spouse Name: N/A  . Number of Children: N/A  . Years of Education: N/A   Occupational History  . Not on file.   Social History Main Topics  . Smoking status: Never Smoker   . Smokeless tobacco: Never Used  . Alcohol Use: No  . Drug Use: No  . Sexual Activity: Not on file   Other Topics Concern  . Not on file   Social History Narrative      Review of Systems  All other systems reviewed and are negative.      Objective:   Physical Exam  Constitutional: She is oriented to person, place, and time. She appears well-developed and well-nourished.  HENT:  Head: Normocephalic and atraumatic.  Right Ear: External ear normal.  Left Ear: External ear normal.  Nose: Nose normal.  Mouth/Throat: Oropharynx is clear and moist. No oropharyngeal exudate.  Eyes: Conjunctivae and EOM are normal. Pupils are equal, round, and reactive to light.  Cardiovascular: Normal rate, regular rhythm, normal heart sounds and intact distal pulses.  Exam reveals no gallop and no friction rub.   No murmur heard. Pulmonary/Chest: Effort normal and breath sounds normal. No respiratory distress. She has no wheezes. She has no rales. She exhibits no tenderness.  Abdominal: Soft. Bowel sounds are normal. She exhibits no distension and no mass. There is tenderness. There is no rebound and no guarding.  Musculoskeletal: She exhibits no edema.  Neurological: She is alert and oriented to person, place, and time. She has normal reflexes. No cranial nerve deficit. She exhibits normal muscle tone.  Coordination normal.  Vitals reviewed.         Assessment & Plan:  Other chest pain - Plan: EKG 12-Lead  I still believe the patient's chest pain is a combination of anxiety as well as GI related chest pain. I recommended she follow up as planned with the cardiologist. Also recommended she start the omeprazole recommended to her by the emergency room. Patient is not interested in further medicines for anxiety at the present time. Patient has low risk factors for pulmonary embolism.  Given the reproducible nature of the pain with chest palpitation, I believe the patient may also be having an element of costochondritis or some type of musculoskeletal chest wall pain.

## 2014-05-06 ENCOUNTER — Ambulatory Visit
Admission: RE | Admit: 2014-05-06 | Discharge: 2014-05-06 | Disposition: A | Discharge status: Home / Self Care | Payer: PRIVATE HEALTH INSURANCE | Source: Ambulatory Visit

## 2014-05-06 DIAGNOSIS — Z1231 Encounter for screening mammogram for malignant neoplasm of breast: Secondary | ICD-10-CM

## 2014-05-11 ENCOUNTER — Ambulatory Visit (INDEPENDENT_AMBULATORY_CARE_PROVIDER_SITE_OTHER): Payer: PRIVATE HEALTH INSURANCE | Admitting: Internal Medicine

## 2014-05-11 ENCOUNTER — Encounter: Payer: Self-pay | Admitting: Internal Medicine

## 2014-05-11 VITALS — BP 128/86 | HR 85 | Ht 66.5 in | Wt 205.8 lb

## 2014-05-11 DIAGNOSIS — R0602 Shortness of breath: Secondary | ICD-10-CM

## 2014-05-11 DIAGNOSIS — R079 Chest pain, unspecified: Secondary | ICD-10-CM

## 2014-05-11 NOTE — Progress Notes (Signed)
Cardiology Office Note   Date:  05/11/2014   ID:  SHEVY [name], DOB [birth year], MRN [id number]  PCP:  [name] Fraction, MD  Cardiologist:   Dorris Carnes, MD   No chief complaint on file. patient is a 49 yo who is referred for eval of CP and SOB      History of Present Illness: [name] is a 49 y.o. female with no known CAD  Has been to the ER x 2 for CP  Followed by Dr Dennard Schaumann.  CP is with stress or with or without exertion.  Squeezing sensation.  Radiate to L shoulder  Does report increased SOB with walking.  Episodes of CP occur 1x per week at least  Xanax occasionally helpts.   Coming in from parking lot had SOB with some chest heaviness.  Eased with rest.   Occasionally chest pressure is pleuritic     Current Outpatient Prescriptions  Medication Sig Dispense Refill  . ALPRAZolam (XANAX) 0.5 MG tablet Take 1 tablet (0.5 mg total) by mouth 3 (three) times daily as needed for anxiety. 30 tablet 0  . amLODipine (NORVASC) 10 MG tablet Take 1 tablet (10 mg total) by mouth daily. 90 tablet 3  . aspirin 81 MG tablet Take 81 mg by mouth as needed for pain.    Marland Kitchen ibuprofen (ADVIL,MOTRIN) 200 MG tablet Take 200 mg by mouth as needed for headache or mild pain (headaches).    Marland Kitchen omeprazole (PRILOSEC) 20 MG capsule Take 1 capsule (20 mg total) by mouth 2 (two) times daily. 60 capsule 0   No current facility-administered medications for this visit.    Allergies:   Penicillins   Past Medical History  Diagnosis Date  . Depression   . Hypertension   . Anxiety   . Rosacea     Past Surgical History  Procedure Laterality Date  . Appendectomy    . Cholecystectomy    . Tonsillectomy    . Lumbar      ruptured disc in lumbar taken out  . Back surgery    . Appendectomy    . Cholecystectomy    . Abdominal hysterectomy      non cancerous     Social History:  The patient  reports that she has never smoked. She has never used smokeless tobacco. She reports that she does not drink  alcohol or use illicit drugs.   Family History:  The patient's family history includes Cancer in her cousin and maternal grandfather; Diabetes in her father, paternal grandfather, and paternal grandmother; Heart disease (age of onset: 65) in her father; Hypertension in her father.    ROS:  Please see the history of present illness. All other systems are reviewed and  Negative to the above problem except as noted.    PHYSICAL EXAM: VS:  BP 128/86 mmHg  Pulse 87  Ht 5' 6.5" (1.689 m)  Wt 205 lb 12.8 oz (93.35 kg)  BMI 32.72 kg/m2  SpO2 98%  GEN: Well nourished, well developed, in no acute distress HEENT: normal Neck: no JVD, carotid bruits, or masses Cardiac: RRR; no murmurs, rubs, or gallops,no edema  Chest  Tender to palpation   Respiratory:  clear to auscultation bilaterally, normal work of breathing GI: soft, nontender, nondistended, + BS  No hepatomegaly  MS: no deformity Moving all extremities   Skin: warm and dry, no rash Neuro:  Strength and sensation are intact Psych: euthymic mood, full affect   EKG:  EKG is ordered  today.  SR 85 bpm     Lipid Panel    Component Value Date/Time   CHOL 147 05/19/2013 0927   TRIG 119 05/19/2013 0927   HDL 57 05/19/2013 0927   CHOLHDL 2.6 05/19/2013 0927   VLDL 24 05/19/2013 0927   LDLCALC 66 05/19/2013 0927      Wt Readings from Last 3 Encounters:  05/11/14 205 lb 12.8 oz (93.35 kg)  05/01/14 208 lb (94.348 kg)  04/29/14 200 lb (90.719 kg)      ASSESSMENT AND PLAN:  1.  CP  Not typical for angina  Some brought on with chest palpation.  Erratic Not brought on consistently with activity. Does have dyspnea with exertion.  She does not occasional wheezing but not on exam today   I would recomm a cardiopulm stress test to tease out cause of symptoms.    2.  HTN  Keep on amlodipine  Follow    Disposition:   FU based on test results    Signed, Dorris Carnes, MD  05/11/2014 3:58 PM    [facility] [address], [place], [place]  [address] Phone: [phone]; Fax: [phone]

## 2014-05-11 NOTE — Patient Instructions (Addendum)
Your physician recommends that you continue on your current medications as directed. Please refer to the Current Medication list given to you today.  Your physician wants you to have a cardio/pulmonary stress test at [facility]. Please schedule at [place].  . You will be contacted when test results have been reviewed by the doctor.

## 2014-06-03 ENCOUNTER — Ambulatory Visit (HOSPITAL_COMMUNITY): Payer: PRIVATE HEALTH INSURANCE | Attending: Internal Medicine

## 2014-06-03 DIAGNOSIS — R0602 Shortness of breath: Secondary | ICD-10-CM

## 2014-06-03 DIAGNOSIS — R079 Chest pain, unspecified: Secondary | ICD-10-CM

## 2014-06-15 ENCOUNTER — Ambulatory Visit (HOSPITAL_COMMUNITY): Payer: PRIVATE HEALTH INSURANCE | Attending: Internal Medicine

## 2014-06-15 DIAGNOSIS — R0602 Shortness of breath: Secondary | ICD-10-CM | POA: Diagnosis not present

## 2014-06-15 DIAGNOSIS — R079 Chest pain, unspecified: Secondary | ICD-10-CM | POA: Insufficient documentation

## 2014-06-22 ENCOUNTER — Encounter: Payer: Self-pay | Admitting: Family Medicine

## 2014-06-22 ENCOUNTER — Ambulatory Visit (INDEPENDENT_AMBULATORY_CARE_PROVIDER_SITE_OTHER): Payer: PRIVATE HEALTH INSURANCE | Admitting: Family Medicine

## 2014-06-22 VITALS — BP 122/90 | HR 84 | Temp 98.4°F | Resp 16 | Ht 66.5 in | Wt 204.0 lb

## 2014-06-22 DIAGNOSIS — E669 Obesity, unspecified: Secondary | ICD-10-CM | POA: Diagnosis not present

## 2014-06-22 DIAGNOSIS — B009 Herpesviral infection, unspecified: Secondary | ICD-10-CM

## 2014-06-22 MED ORDER — PHENTERMINE HCL 37.5 MG PO TABS
37.5000 mg | ORAL_TABLET | Freq: Every day | ORAL | Status: DC
Start: 2014-06-22 — End: 2014-09-29

## 2014-06-23 ENCOUNTER — Encounter: Payer: Self-pay | Admitting: Family Medicine

## 2014-06-23 NOTE — Progress Notes (Signed)
Subjective:    Patient ID: [name], female    DOB: [birth year], 49 y.o.   MRN: [id number]  HPI Patient's boyfriend was recently diagnosed with herpes simplex viral infection of the mouth. He had numerous ulcers inside his mouth and on his tongue. 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 Patient was referred for testing. She went to an urgent care. Lab work at the urgent care showed no evidence of gonorrhea, Chlamydia, HIV, syphilis. However it did show positive titers of IgG for HSV-2. Patient denies any history of genital ulcers or sores. She does have a history of cold sores.  Patient has also recently passed her stress test. There is no evidence of ischemic heart disease. She would like to start medication to help with weight loss due to her deconditioning and obesity. Past Medical History  Diagnosis Date  . Depression   . Hypertension   . Anxiety   . Rosacea    Past Surgical History  Procedure Laterality Date  . Appendectomy    . Cholecystectomy    . Tonsillectomy    . Lumbar      ruptured disc in lumbar taken out  . Back surgery    . Appendectomy    . Cholecystectomy    . Abdominal hysterectomy      non cancerous   Current Outpatient Prescriptions on File Prior to Visit  Medication Sig Dispense Refill  . ALPRAZolam (XANAX) 0.5 MG tablet Take 1 tablet (0.5 mg total) by mouth 3 (three) times daily as needed for anxiety. 30 tablet 0  . amLODipine (NORVASC) 10 MG tablet Take 1 tablet (10 mg total) by mouth daily. 90 tablet 3  . aspirin 81 MG tablet Take 81 mg by mouth as needed for pain.    Marland Kitchen ibuprofen (ADVIL,MOTRIN) 200 MG tablet Take 200 mg by mouth as needed for headache or mild pain (headaches).    Marland Kitchen omeprazole (PRILOSEC) 20 MG capsule Take 1 capsule (20 mg total) by mouth 2 (two) times daily. 60 capsule 0   No current facility-administered medications on file prior to visit.   Allergies  Allergen Reactions  . Penicillins Hives   History   Social History  . Marital Status:  Divorced    Spouse Name: N/A  . Number of Children: N/A  . Years of Education: N/A   Occupational History  . Not on file.   Social History Main Topics  . Smoking status: Never Smoker   . Smokeless tobacco: Never Used  . Alcohol Use: No  . Drug Use: No  . Sexual Activity: Not on file   Other Topics Concern  . Not on file   Social History Narrative      Review of Systems  All other systems reviewed and are negative.      Objective:   Physical Exam  Cardiovascular: Normal rate, regular rhythm and normal heart sounds.   No murmur heard. Pulmonary/Chest: Effort normal and breath sounds normal. No respiratory distress. She has no wheezes. She has no rales.  Abdominal: Soft. Bowel sounds are normal.  Vitals reviewed.         Assessment & Plan:  Obesity - Plan: phentermine (ADIPEX-P) 37.5 MG tablet  HSV-2 (herpes simplex virus 2) infection  I explained to the patient the lab work shows a history of HSV-2 infection. It is impossible to determine the site of location. Her cold sores could be potentially HSV-2. I explained to the patient the natural history of this virus and when she is contagious.  I offer the patient Valtrex for suppression. She would like to consider that. She also has a rash on the posterior aspect of her neck. It is an erythematous patch approximately 3 cm x 4 cm. Garlic indications and small 2-3 mm erythematous papules. It appears to be atopic dermatitis. I recommended trying over-the-counter cortisone cream for 2 weeks and if no better return for recheck. I will give the patient Adipex-P 37.5 mg 1 by mouth every morning for weight loss. Patient can take this for the next 2 months. We also talked about diet exercise as a mainstay for weight loss.

## 2014-07-13 ENCOUNTER — Encounter: Payer: Self-pay | Admitting: Internal Medicine

## 2014-07-13 ENCOUNTER — Ambulatory Visit (INDEPENDENT_AMBULATORY_CARE_PROVIDER_SITE_OTHER): Payer: PRIVATE HEALTH INSURANCE | Admitting: Internal Medicine

## 2014-07-13 VITALS — BP 120/80 | HR 87 | Ht 67.0 in | Wt 200.8 lb

## 2014-07-13 DIAGNOSIS — I10 Essential (primary) hypertension: Secondary | ICD-10-CM | POA: Diagnosis not present

## 2014-07-13 NOTE — Patient Instructions (Signed)
Your physician recommends that you continue on your current medications as directed. Please refer to the Current Medication list given to you today.  Your physician wants you to follow-up in: February 2016 with Dr. Harrington [name].  You will receive a reminder letter in the mail two months in advance. If you don't receive a letter, please call our office to schedule the follow-up appointment.  You have been referred to Nutrition for weight loss education. Calorie Counting for Weight Loss Calories are energy you get from the things you eat and drink. Your body uses this energy to keep you going throughout the day. The number of calories you eat affects your weight. When you eat more calories than your body needs, your body stores the extra calories as fat. When you eat fewer calories than your body needs, your body burns fat to get the energy it needs. Calorie counting means keeping track of how many calories you eat and drink each day. If you make sure to eat fewer calories than your body needs, you should lose weight. In order for calorie counting to work, you will need to eat the number of calories that are right for you in a day to lose a healthy amount of weight per week. A healthy amount of weight to lose per week is usually 1-2 lb (0.5-0.9 kg). A dietitian can determine how many calories you need in a day and give you suggestions on how to reach your calorie goal.  WHAT IS MY MY PLAN? My goal is to have __________ calories per day.  If I have this many calories per day, I should lose around __________ pounds per week. WHAT DO I NEED TO KNOW ABOUT CALORIE COUNTING? In order to meet your daily calorie goal, you will need to:  Find out how many calories are in each food you would like to eat. Try to do this before you eat.  Decide how much of the food you can eat.  Write down what you ate and how many calories it had. Doing this is called keeping a food log. WHERE DO I FIND CALORIE INFORMATION? The  number of calories in a food can be found on a Nutrition Facts label. Note that all the information on a label is based on a specific serving of the food. If a food does not have a Nutrition Facts label, try to look up the calories online or ask your dietitian for help. HOW DO I DECIDE HOW MUCH TO EAT? To decide how much of the food you can eat, you will need to consider both the number of calories in one serving and the size of one serving. This information can be found on the Nutrition Facts label. If a food does not have a Nutrition Facts label, look up the information online or ask your dietitian for help. Remember that calories are listed per serving. If you choose to have more than one serving of a food, you will have to multiply the calories per serving by the amount of servings you plan to eat. For example, the label on a package of bread might say that a serving size is 1 slice and that there are 90 calories in a serving. If you eat 1 slice, you will have eaten 90 calories. If you eat 2 slices, you will have eaten 180 calories. HOW DO I KEEP A FOOD LOG? After each meal, record the following information in your food log:  What you ate.  How much of it  you ate.  How many calories it had.  Then, add up your calories. Keep your food log near you, such as in a small notebook in your pocket. Another option is to use a mobile app or website. Some programs will calculate calories for you and show you how many calories you have left each time you add an item to the log. WHAT ARE SOME CALORIE COUNTING TIPS?  Use your calories on foods and drinks that will fill you up and not leave you hungry. Some examples of this include foods like nuts and nut butters, vegetables, lean proteins, and high-fiber foods (more than 5 g fiber per serving).  Eat nutritious foods and avoid empty calories. Empty calories are calories you get from foods or beverages that do not have many nutrients, such as candy and soda.  It is better to have a nutritious high-calorie food (such as an avocado) than a food with few nutrients (such as a bag of chips).  Know how many calories are in the foods you eat most often. This way, you do not have to look up how many calories they have each time you eat them.  Look out for foods that may seem like low-calorie foods but are really high-calorie foods, such as baked goods, soda, and fat-free candy.  Pay attention to calories in drinks. Drinks such as sodas, specialty coffee drinks, alcohol, and juices have a lot of calories yet do not fill you up. Choose low-calorie drinks like water and diet drinks.  Focus your calorie counting efforts on higher calorie items. Logging the calories in a garden salad that contains only vegetables is less important than calculating the calories in a milk shake.  Find a way of tracking calories that works for you. Get creative. Most people who are successful find ways to keep track of how much they eat in a day, even if they do not count every calorie. WHAT ARE SOME PORTION CONTROL TIPS?  Know how many calories are in a serving. This will help you know how many servings of a certain food you can have.  Use a measuring cup to measure serving sizes. This is helpful when you start out. With time, you will be able to estimate serving sizes for some foods.  Take some time to put servings of different foods on your favorite plates, bowls, and cups so you know what a serving looks like.  Try not to eat straight from a bag or box. Doing this can lead to overeating. Put the amount you would like to eat in a cup or on a plate to make sure you are eating the right portion.  Use smaller plates, glasses, and bowls to prevent overeating. This is a quick and easy way to practice portion control. If your plate is smaller, less food can fit on it.  Try not to multitask while eating, such as watching TV or using your computer. If it is time to eat, sit down at a  table and enjoy your food. Doing this will help you to start recognizing when you are full. It will also make you more aware of what and how much you are eating. HOW CAN I CALORIE COUNT WHEN EATING OUT?  Ask for smaller portion sizes or child-sized portions.  Consider sharing an entree and sides instead of getting your own entree.  If you get your own entree, eat only half. Ask for a box at the beginning of your meal and put the rest of  your entree in it so you are not tempted to eat it.  Look for the calories on the menu. If calories are listed, choose the lower calorie options.  Choose dishes that include vegetables, fruits, whole grains, low-fat dairy products, and lean protein. Focusing on smart food choices from each of the 5 food groups can help you stay on track at restaurants.  Choose items that are boiled, broiled, grilled, or steamed.  Choose water, milk, unsweetened iced tea, or other drinks without added sugars. If you want an alcoholic beverage, choose a lower calorie option. For example, a regular margarita can have up to 700 calories and a glass of wine has around 150.  Stay away from items that are buttered, battered, fried, or served with cream sauce. Items labeled "crispy" are usually fried, unless stated otherwise.  Ask for dressings, sauces, and syrups on the side. These are usually very high in calories, so do not eat much of them.  Watch out for salads. Many people think salads are a healthy option, but this is often not the case. Many salads come with bacon, fried chicken, lots of cheese, fried chips, and dressing. All of these items have a lot of calories. If you want a salad, choose a garden salad and ask for grilled meats or steak. Ask for the dressing on the side, or ask for olive oil and vinegar or lemon to use as dressing.  Estimate how many servings of a food you are given. For example, a serving of cooked rice is  cup or about the size of half a tennis ball or  one cupcake wrapper. Knowing serving sizes will help you be aware of how much food you are eating at restaurants. The list below tells you how big or small some common portion sizes are based on everyday objects.  1 oz--4 stacked dice.  3 oz--1 deck of cards.  1 tsp--1 dice.  1 Tbsp-- a Ping-Pong ball.  2 Tbsp--1 Ping-Pong ball.   cup--1 tennis ball or 1 cupcake wrapper.  1 cup--1 baseball. Document Released: 02/20/2005 Document Revised: 07/07/2013 Document Reviewed: 12/26/2012 [facility] Patient Information 2015 [place], [place]. This information is not intended to replace advice given to you by your health care provider. Make sure you discuss any questions you have with your health care provider.

## 2014-07-13 NOTE — Progress Notes (Signed)
Cardiology Office Note   Date:  07/13/2014   ID:  [name], DOB March 19, 1965, MRN [id number]  PCP:  Odette Fraction, MD  Cardiologist:   Dorris Carnes, MD   No chief complaint on file.     History of Present Illness: [name] is a 49 y.o. female with a history of CP  I saw her in March 2016  CP atypical   She underwent cardiopulmonary stress test   This showed no limitation due to ischemia  More limitation by body habitus / weight    Since seen she still gets SOB with activity  Wt is down 4lbs She has been placed on phentermine by Dr Dennard Schaumann        Current Outpatient Prescriptions  Medication Sig Dispense Refill  . ALPRAZolam (XANAX) 0.5 MG tablet Take 1 tablet (0.5 mg total) by mouth 3 (three) times daily as needed for anxiety. 30 tablet 0  . amLODipine (NORVASC) 10 MG tablet Take 1 tablet (10 mg total) by mouth daily. 90 tablet 3  . aspirin 81 MG tablet Take 81 mg by mouth as needed for pain.    Marland Kitchen ibuprofen (ADVIL,MOTRIN) 200 MG tablet Take 200 mg by mouth as needed for headache or mild pain (headaches).    Marland Kitchen omeprazole (PRILOSEC) 20 MG capsule Take 1 capsule (20 mg total) by mouth 2 (two) times daily. 60 capsule 0  . phentermine (ADIPEX-P) 37.5 MG tablet Take 1 tablet (37.5 mg total) by mouth daily before breakfast. 30 tablet 1   No current facility-administered medications for this visit.    Allergies:   Penicillins   Past Medical History  Diagnosis Date  . Depression   . Hypertension   . Anxiety   . Rosacea     Past Surgical History  Procedure Laterality Date  . Appendectomy    . Cholecystectomy    . Tonsillectomy    . Lumbar      ruptured disc in lumbar taken out  . Back surgery    . Appendectomy    . Cholecystectomy    . Abdominal hysterectomy      non cancerous     Social History:  The patient  reports that she has never smoked. She has never used smokeless tobacco. She reports that she does not drink alcohol or use illicit drugs.    Family History:  The patient's family history includes Cancer in her cousin and maternal grandfather; Diabetes in her father, paternal grandfather, and paternal grandmother; Heart disease (age of onset: 63) in her father; Hypertension in her father.    ROS:  Please see the history of present illness. All other systems are reviewed and  Negative to the above problem except as noted.    PHYSICAL EXAM: VS:  BP 120/80 mmHg  Pulse 87  Ht 5\' 7"  (1.702 m)  Wt 200 lb 12.8 oz (91.082 kg)  BMI 31.44 kg/m2  GEN: Well nourished, well developed, in no acute distress HEENT: normal Neck: no JVD, carotid bruits, or masses Cardiac: RRR; no murmurs, rubs, or gallops,no edema  Respiratory:  clear to auscultation bilaterally, normal work of breathing GI: soft, nontender, nondistended, + BS  No hepatomegaly  MS: no deformity Moving all extremities   Skin: warm and dry, no rash Neuro:  Strength and sensation are intact Psych: euthymic mood, full affect   EKG:  EKG is not ordered today.   Lipid Panel    Component Value Date/Time   CHOL 147 05/19/2013 0927  TRIG 119 05/19/2013 0927   HDL 57 05/19/2013 0927   CHOLHDL 2.6 05/19/2013 0927   VLDL 24 05/19/2013 0927   LDLCALC 66 05/19/2013 0927      Wt Readings from Last 3 Encounters:  07/13/14 200 lb 12.8 oz (91.082 kg)  06/22/14 204 lb (92.534 kg)  05/11/14 205 lb 12.8 oz (93.35 kg)      ASSESSMENT AND PLAN:  1.  Dyspnea  Cardiopulmonary Stress test results suggest that it is related to weight   Will refer to dietary  2.  HTN  Good control  Will f/u in Feb 2017     Signed, Dorris Carnes, MD  07/13/2014 2:28 PM    [name], [place], [place]  [address] Phone: [phone]; Fax: [phone]

## 2014-09-21 ENCOUNTER — Ambulatory Visit: Payer: PRIVATE HEALTH INSURANCE | Admitting: Skilled Nursing Facility1

## 2014-09-29 ENCOUNTER — Encounter: Payer: Self-pay | Admitting: Family Medicine

## 2014-09-29 ENCOUNTER — Ambulatory Visit (INDEPENDENT_AMBULATORY_CARE_PROVIDER_SITE_OTHER): Payer: Managed Care, Other (non HMO) | Admitting: Family Medicine

## 2014-09-29 ENCOUNTER — Other Ambulatory Visit: Payer: Self-pay | Admitting: Family Medicine

## 2014-09-29 VITALS — BP 130/72 | HR 66 | Temp 97.5°F | Resp 12 | Ht 67.0 in | Wt 200.0 lb

## 2014-09-29 DIAGNOSIS — G43809 Other migraine, not intractable, without status migrainosus: Secondary | ICD-10-CM

## 2014-09-29 DIAGNOSIS — G43909 Migraine, unspecified, not intractable, without status migrainosus: Secondary | ICD-10-CM | POA: Insufficient documentation

## 2014-09-29 DIAGNOSIS — M5431 Sciatica, right side: Secondary | ICD-10-CM | POA: Diagnosis not present

## 2014-09-29 DIAGNOSIS — F419 Anxiety disorder, unspecified: Secondary | ICD-10-CM | POA: Diagnosis not present

## 2014-09-29 DIAGNOSIS — F418 Other specified anxiety disorders: Secondary | ICD-10-CM | POA: Diagnosis not present

## 2014-09-29 DIAGNOSIS — I10 Essential (primary) hypertension: Secondary | ICD-10-CM

## 2014-09-29 MED ORDER — HYDROCODONE-ACETAMINOPHEN 5-325 MG PO TABS: 1.0000 | ORAL_TABLET | Freq: Four times a day (QID) | ORAL | Status: DC | PRN

## 2014-09-29 MED ORDER — TOPIRAMATE 25 MG PO TABS: ORAL_TABLET | ORAL | Status: DC

## 2014-09-29 MED ORDER — SUMATRIPTAN SUCCINATE 100 MG PO TABS: 100.0000 mg | ORAL_TABLET | ORAL | Status: DC | PRN

## 2014-09-29 MED ORDER — AMLODIPINE BESYLATE 5 MG PO TABS: 5.0000 mg | ORAL_TABLET | Freq: Every day | ORAL | Status: DC

## 2014-09-29 MED ORDER — ALPRAZOLAM 0.5 MG PO TABS: 0.5000 mg | ORAL_TABLET | Freq: Two times a day (BID) | ORAL | Status: DC | PRN

## 2014-09-29 MED ORDER — METHYLPREDNISOLONE ACETATE 40 MG/ML IJ SUSP
40.0000 mg | Freq: Once | INTRAMUSCULAR | Status: AC
Start: 2014-09-29 — End: 2014-09-29
  Administered 2014-09-29: 40 mg via INTRAMUSCULAR

## 2014-09-29 NOTE — Progress Notes (Signed)
Patient ID: [name], female   DOB: [birth year], 49 y.o.   MRN: [id number]   Subjective:    Patient ID: [name], female    DOB: December 27, 1965, 49 y.o.   MRN: [id number]  Patient presents for Frequent HA and R Hip Pain  patient here with recurrent headaches for the past few months. She has a lot of stress at home and difficulty with finances. She actually lost her insurance when she switched jobs a few months ago has been out of her blood pressure and anxiety medicine for the past month. Her headaches almost daily. She feels across her for head and down towards her neck. She gets blurred vision nausea and photophobia when they occur sometimes will last hours. She has missed a couple days of work because of the severe headaches. She's tried over-the-counter Excedrin which used to work but no longer helps. She has family history of migraines.  She also complains today of a flare of her sciatica she is known chronic back pain and has had interventions on her back in the past. No change in bowel or bladder.    Review Of Systems:  GEN- denies fatigue, fever, weight loss,weakness, recent illness HEENT- denies eye drainage, change in vision, nasal discharge, CVS- denies chest pain, palpitations RESP- denies SOB, cough, wheeze ABD- denies N/V, change in stools, abd pain GU- denies dysuria, hematuria, dribbling, incontinence MSK- + joint pain, muscle aches, injury Neuro- + headache, dizziness, syncope, seizure activity       Objective:    BP 130/72 mmHg  Pulse 66  Temp(Src) 97.5 F (36.4 C) (Oral)  Resp 12  Ht 5\' 7"  (1.702 m)  Wt 200 lb (90.719 kg)  BMI 31.32 kg/m2 GEN- NAD, alert and oriented x3 HEENT- PERRL, EOMI, non injected sclera, pink conjunctiva, MMM, oropharynx clear, fundus benign Neck- Supple, no thyromegaly CVS- RRR, no murmur RESP-CTAB MSK- TTP lumbar spine and right paraspinals, +SLR right side, faiR ROM (pain ) with ROM Neuro-CNII-XII intact, no deficits, normal  tone sensation bilat LE Psych- normal affect and mood EXT- No edema Pulses- Radial, DP- 2+        Assessment & Plan:      Problem List Items Addressed This Visit    Migraines - Primary    There is significant amount of stress at home. They have her on Topamax 25 mg and titrate up to 50 mg. I've also given her Imitrex to use she is to stop the use of over-the-counter medications as I think this is causing rebound headache      Relevant Medications   amLODipine (NORVASC) 5 MG tablet   topiramate (TOPAMAX) 25 MG tablet   HYDROcodone-acetaminophen (NORCO) 5-325 MG per tablet   SUMAtriptan (IMITREX) 100 MG tablet   Hypertension    Change to Norvasc 5 mg blood pressure actually looks okay off her medication for the past few weeks      Relevant Medications   amLODipine (NORVASC) 5 MG tablet   Anxiety   Relevant Medications   ALPRAZolam (XANAX) 0.5 MG tablet    Other Visit Diagnoses    Benign essential HTN        Relevant Medications    amLODipine (NORVASC) 5 MG tablet    Situational anxiety        Relevant Medications    ALPRAZolam (XANAX) 0.5 MG tablet    Sciatica, right        Given Depo-Medrol injection. I have also given her a few tablets of  hydrocodone and a work note for today. This is all very chronic typically she is maintaining without medication    Relevant Medications    ALPRAZolam (XANAX) 0.5 MG tablet    topiramate (TOPAMAX) 25 MG tablet    methylPREDNISolone acetate (DEPO-MEDROL) injection 40 mg (Completed)       Note: This dictation was prepared with Dragon dictation along with smaller phrase technology. Any transcriptional errors that result from this process are unintentional.

## 2014-09-29 NOTE — Patient Instructions (Addendum)
Restart lower dose of blood pressure medication  Take xanax as needed For migraines- start topamax- take 1 tablet at bedtime, increase to 2 tablets after 2 weeks Use imitrex for abortive treatment For back- steroid shot given, pain medication as needed  Give Note for WORK- out today, return tomorrow  F/U 8 weeks

## 2014-09-30 NOTE — Telephone Encounter (Signed)
Duplicate request, Xanax called in yesterday

## 2014-09-30 NOTE — Assessment & Plan Note (Signed)
Change to Norvasc 5 mg blood pressure actually looks okay off her medication for the past few weeks

## 2014-09-30 NOTE — Assessment & Plan Note (Signed)
There is significant amount of stress at home. They have her on Topamax 25 mg and titrate up to 50 mg. I've also given her Imitrex to use she is to stop the use of over-the-counter medications as I think this is causing rebound headache

## 2014-10-22 ENCOUNTER — Encounter: Payer: Managed Care, Other (non HMO) | Attending: Family Medicine | Admitting: Dietician

## 2014-10-22 ENCOUNTER — Encounter: Payer: Self-pay | Admitting: Dietician

## 2014-10-22 VITALS — Ht 67.0 in | Wt 193.1 lb

## 2014-10-22 DIAGNOSIS — Z713 Dietary counseling and surveillance: Secondary | ICD-10-CM | POA: Insufficient documentation

## 2014-10-22 DIAGNOSIS — E669 Obesity, unspecified: Secondary | ICD-10-CM | POA: Diagnosis present

## 2014-10-22 DIAGNOSIS — Z6830 Body mass index (BMI) 30.0-30.9, adult: Secondary | ICD-10-CM | POA: Insufficient documentation

## 2014-10-22 NOTE — Patient Instructions (Signed)
Plan to walk 2 x week for 30 minutes. Plan to eat a meal or snack at least 3 x day (try to eat something every 3-5 hours you are awake). Have protein and carbs for each meal and snack.  Add in vegetables (half of your plate) at meals. 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 For snacks have peanut butter and crackers, cottage cheese and fruit, [employer] Protein bar, yogurt, etc (see list). Try to drink more water.

## 2014-10-22 NOTE — Progress Notes (Signed)
Medical Nutrition Therapy:  Appt start time: 1100 end time:  1200.   Assessment:  Primary concerns today: [name] is here today since she would like to lose weight and reduce the stress on her heart. Has had a lot of stress recently. Has been trying to lose weight by eating less, drinking a lot of water, and walking a lot at work. No longer drinking soft drinks and sweet tea in past year. Started taking topomax 3 weeks ago and sodas don't taste as good as they used to. Lost about 7 lbs in the past 3 weeks. Appetite is a lot less on topomax. Blood pressure has been better since adjusting blood pressure medication recently. Weight loss goal is about 160 lbs.  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 Gets up at 8-9 AM. Has been working 7 days lately. Lives with her friend's son and wife temporarily (they moved in with her and they are not eating meals together). Does her meal preparation. Skips breakfast most days, has lunch 2-3 x week, not eating much at dinner time. Was eating more regularly before starting topomax, Has trouble fixing meals for just herself. Eats out 3 x week for lunch and weekends when she is off.   Has had 3 days where she has eaten nothing all in the past 3 weeks (no weekend days).  Will sometimes eat if stressed or sometimes stress will make her not eat. Sometimes will walk if stress. Has had depression/suicidal thoughts in the past and has attempted suicide 3 times, though not in past 15 years. Main stress now is her adult children and finances.   Doesn't like walking by herself. Would like to start an exercise routine.   Preferred Learning Style:   No preference indicated   Learning Readiness:   Ready  MEDICATIONS: see list   DIETARY INTAKE:  Usual eating pattern includes 1-3 meals/ snacks per day.  Avoided foods include: tomatoes, greens, mushrooms, onions  24-hr recall:  B ( AM): none or strawberry smoothie from [employer]  Snk  ( AM): none or pack of small sausage biscuits, fruit L ( PM): none or banana or ham/turkey sandwich or cheeseburger from [employer]  Snk ( PM): none D ( PM): none or trail mix, blueberry muffins, crackers, fruit or goes out to eat on weekends - chicken/steak with baked or sweet potato, fish/shrimp, or pizza Snk ( PM): none Beverages: water, tea, might have mixed drink on weekend  Usual physical activity: walking at work, walked at home recently though not recently   Estimated energy needs: 1800 calories 200 g carbohydrates 135 g protein 50 g fat  Progress Towards Goal(s):  In progress.   Nutritional Diagnosis:  [place]-3.3 Overweight/obesity As related to hx of meal skipping and energy dense food choices.  As evidenced by BMI of 30.2.    Intervention:  Nutrition counseling provided. Plan: Plan to walk 2 x week for 30 minutes. Plan to eat a meal or snack at least 3 x day (try to eat something every 3-5 hours you are awake). Have protein and carbs for each meal and snack.  Add in vegetables (half of your plate) at meals. 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 For snacks have peanut butter and crackers, cottage cheese and fruit, [employer] Protein bar, yogurt, etc (see list). Try to drink more water.  Teaching Method Utilized:  Visual Auditory Hands on  Handouts  given during visit include:  MyPlate Handout  Meal Card  15 g CHO Snacks  Barriers to learning/adherence to lifestyle change: stress, finances  Demonstrated degree of understanding via:  Teach Back   Monitoring/Evaluation:  Dietary intake, exercise, and body weight in 2 month(s).

## 2014-10-23 ENCOUNTER — Ambulatory Visit: Payer: PRIVATE HEALTH INSURANCE | Admitting: Skilled Nursing Facility1

## 2014-11-16 ENCOUNTER — Encounter: Payer: Self-pay | Admitting: Family Medicine

## 2014-11-16 ENCOUNTER — Ambulatory Visit (INDEPENDENT_AMBULATORY_CARE_PROVIDER_SITE_OTHER): Payer: Managed Care, Other (non HMO) | Admitting: Family Medicine

## 2014-11-16 VITALS — BP 128/72 | HR 82 | Temp 98.6°F | Resp 18 | Ht 66.5 in | Wt 194.0 lb

## 2014-11-16 DIAGNOSIS — J019 Acute sinusitis, unspecified: Secondary | ICD-10-CM

## 2014-11-16 MED ORDER — LEVOFLOXACIN 500 MG PO TABS: 500.0000 mg | ORAL_TABLET | Freq: Every day | ORAL | Status: DC

## 2014-11-16 MED ORDER — PREDNISONE 20 MG PO TABS: ORAL_TABLET | ORAL | Status: DC

## 2014-11-16 NOTE — Progress Notes (Signed)
Subjective:    Patient ID: [name], female    DOB: Aug 21, 1965, 49 y.o.   MRN: [id number]  HPI   symptoms began last week on Wednesday. She's had a severe headache over her frontal sinuses bilaterally for the last week. She has terrible headache congestion and sinus pressure and pain. She reports subjective fevers, postnasal drip, and generalized malaise. She is tender to palpation in both frontal sinuses and in both maxillary sinuses. She also has developed a cough due to postnasal drip. Past Medical History  Diagnosis Date  . Depression   . Hypertension   . Anxiety   . Rosacea    Past Surgical History  Procedure Laterality Date  . Appendectomy    . Cholecystectomy    . Tonsillectomy    . Lumbar      ruptured disc in lumbar taken out  . Back surgery    . Appendectomy    . Cholecystectomy    . Abdominal hysterectomy      non cancerous   Current Outpatient Prescriptions on File Prior to Visit  Medication Sig Dispense Refill  . ALPRAZolam (XANAX) 0.5 MG tablet Take 1 tablet (0.5 mg total) by mouth 2 (two) times daily as needed for anxiety. 45 tablet 1  . amLODipine (NORVASC) 5 MG tablet Take 1 tablet (5 mg total) by mouth daily. 30 tablet 6  . aspirin 81 MG tablet Take 81 mg by mouth as needed for pain.    Marland Kitchen HYDROcodone-acetaminophen (NORCO) 5-325 MG per tablet Take 1 tablet by mouth every 6 (six) hours as needed for moderate pain. 30 tablet 0  . ibuprofen (ADVIL,MOTRIN) 200 MG tablet Take 200 mg by mouth as needed for headache or mild pain (headaches).    Marland Kitchen omeprazole (PRILOSEC) 20 MG capsule Take 1 capsule (20 mg total) by mouth 2 (two) times daily. (Patient taking differently: Take 20 mg by mouth 2 (two) times daily. PRN) 60 capsule 0  . SUMAtriptan (IMITREX) 100 MG tablet Take 1 tablet (100 mg total) by mouth every 2 (two) hours as needed for migraine. May repeat in 2 hours if headache persists or recurs. 10 tablet 1  . topiramate (TOPAMAX) 25 MG tablet Take 1-2  tablet  at bedtime 60 tablet 3   No current facility-administered medications on file prior to visit.   Allergies  Allergen Reactions  . Penicillins Hives   Social History   Social History  . Marital Status: Divorced    Spouse Name: N/A  . Number of Children: N/A  . Years of Education: N/A   Occupational History  . Not on file.   Social History Main Topics  . Smoking status: Former Research scientist (life sciences)  . Smokeless tobacco: Never Used  . Alcohol Use: No  . Drug Use: No  . Sexual Activity: Yes   Other Topics Concern  . Not on file   Social History Narrative     Review of Systems  All other systems reviewed and are negative.      Objective:   Physical Exam  Constitutional: She appears well-developed and well-nourished.  HENT:  Head: Normocephalic and atraumatic.  Right Ear: Tympanic membrane and ear canal normal.  Left Ear: Tympanic membrane and ear canal normal.  Nose: Mucosal edema and rhinorrhea present. Right sinus exhibits maxillary sinus tenderness and frontal sinus tenderness. Left sinus exhibits maxillary sinus tenderness and frontal sinus tenderness.  Mouth/Throat: Oropharynx is clear and moist. No oropharyngeal exudate.  Cardiovascular: Normal rate, regular rhythm and normal heart  sounds.   Pulmonary/Chest: Effort normal and breath sounds normal. No respiratory distress. She has no wheezes. She has no rales.  Lymphadenopathy:    She has no cervical adenopathy.  Vitals reviewed.         Assessment & Plan:  Acute rhinosinusitis - Plan: levofloxacin (LEVAQUIN) 500 MG tablet, predniSONE (DELTASONE) 20 MG tablet   Patient has sinusitis. Begin Levaquin 500 mg by mouth daily for 7 days because of her history of allergies to penicillin and Ancef). Also begin a prednisone taper pack.

## 2014-11-19 ENCOUNTER — Encounter: Payer: Self-pay | Admitting: Family Medicine

## 2014-12-06 ENCOUNTER — Emergency Department (HOSPITAL_COMMUNITY)
Admission: EM | Admit: 2014-12-06 | Discharge: 2014-12-06 | Disposition: A | Discharge status: Home / Self Care | Payer: Managed Care, Other (non HMO) | Attending: Emergency Medicine | Admitting: Emergency Medicine

## 2014-12-06 ENCOUNTER — Encounter (HOSPITAL_COMMUNITY): Payer: Self-pay | Admitting: Emergency Medicine

## 2014-12-06 DIAGNOSIS — Z792 Long term (current) use of antibiotics: Secondary | ICD-10-CM | POA: Insufficient documentation

## 2014-12-06 DIAGNOSIS — Z79899 Other long term (current) drug therapy: Secondary | ICD-10-CM | POA: Diagnosis not present

## 2014-12-06 DIAGNOSIS — G43909 Migraine, unspecified, not intractable, without status migrainosus: Secondary | ICD-10-CM | POA: Insufficient documentation

## 2014-12-06 DIAGNOSIS — Z87891 Personal history of nicotine dependence: Secondary | ICD-10-CM | POA: Insufficient documentation

## 2014-12-06 DIAGNOSIS — Z872 Personal history of diseases of the skin and subcutaneous tissue: Secondary | ICD-10-CM | POA: Diagnosis not present

## 2014-12-06 DIAGNOSIS — F329 Major depressive disorder, single episode, unspecified: Secondary | ICD-10-CM | POA: Diagnosis not present

## 2014-12-06 DIAGNOSIS — F419 Anxiety disorder, unspecified: Secondary | ICD-10-CM | POA: Diagnosis not present

## 2014-12-06 DIAGNOSIS — H53149 Visual discomfort, unspecified: Secondary | ICD-10-CM | POA: Diagnosis present

## 2014-12-06 DIAGNOSIS — Z880 Allergy status to penicillin: Secondary | ICD-10-CM | POA: Insufficient documentation

## 2014-12-06 DIAGNOSIS — I10 Essential (primary) hypertension: Secondary | ICD-10-CM | POA: Insufficient documentation

## 2014-12-06 MED ORDER — METOCLOPRAMIDE HCL 5 MG/ML IJ SOLN
10.0000 mg | Freq: Once | INTRAMUSCULAR | Status: AC
  Administered 2014-12-06: 10 mg via INTRAVENOUS
  Filled 2014-12-06: qty 2

## 2014-12-06 MED ORDER — DEXAMETHASONE SODIUM PHOSPHATE 4 MG/ML IJ SOLN
8.0000 mg | Freq: Once | INTRAMUSCULAR | Status: AC
  Administered 2014-12-06: 8 mg via INTRAVENOUS
  Filled 2014-12-06: qty 2

## 2014-12-06 MED ORDER — SODIUM CHLORIDE 0.9 % IV BOLUS (SEPSIS)
1000.0000 mL | Freq: Once | INTRAVENOUS | Status: AC
  Administered 2014-12-06: 1000 mL via INTRAVENOUS

## 2014-12-06 MED ORDER — MORPHINE SULFATE (PF) 4 MG/ML IV SOLN
4.0000 mg | Freq: Once | INTRAVENOUS | Status: AC
  Administered 2014-12-06: 4 mg via INTRAVENOUS
  Filled 2014-12-06: qty 1

## 2014-12-06 MED ORDER — ONDANSETRON HCL 4 MG/2ML IJ SOLN
4.0000 mg | Freq: Once | INTRAMUSCULAR | Status: AC
  Administered 2014-12-06: 4 mg via INTRAVENOUS
  Filled 2014-12-06: qty 2

## 2014-12-06 MED ORDER — KETOROLAC TROMETHAMINE 30 MG/ML IJ SOLN
30.0000 mg | Freq: Once | INTRAMUSCULAR | Status: AC
  Administered 2014-12-06: 30 mg via INTRAVENOUS
  Filled 2014-12-06: qty 1

## 2014-12-06 MED ORDER — DIPHENHYDRAMINE HCL 50 MG/ML IJ SOLN
25.0000 mg | Freq: Once | INTRAMUSCULAR | Status: AC
  Administered 2014-12-06: 25 mg via INTRAVENOUS
  Filled 2014-12-06: qty 1

## 2014-12-06 NOTE — ED Notes (Signed)
PT c/o headache x2 days with hx of migraines. PT states no relief from her migraine medications. PT c/o photophobia, light sensitvity, and blurred vision.

## 2014-12-06 NOTE — Discharge Instructions (Signed)
Follow-up your primary care doctor. °

## 2014-12-06 NOTE — ED Provider Notes (Signed)
CSN: [id number]     Arrival date & time 12/06/14  1344 History   First MD Initiated Contact with Patient 12/06/14 1354     Chief Complaint  Patient presents with  . Migraine     (Consider location/radiation/quality/duration/timing/severity/associated sxs/prior Treatment) HPI..... Migraine headache for 2 days. Patient has tried her Imitrex with minimal relief. Additionally she is on Topamax as a prophylactic medication. Review systems positive for photophobia, slight blurred vision, numbness and tingling in extremities. She is ambulatory. Past medical history includes depression, anxiety, hypertension. Decreased oral intake. No stiff neck, fever, gross neurological deficits.  Past Medical History  Diagnosis Date  . Depression   . Hypertension   . Anxiety   . Rosacea    Past Surgical History  Procedure Laterality Date  . Appendectomy    . Cholecystectomy    . Tonsillectomy    . Lumbar      ruptured disc in lumbar taken out  . Back surgery    . Appendectomy    . Cholecystectomy    . Abdominal hysterectomy      non cancerous   Family History  Problem Relation Age of Onset  . Diabetes Father   . Heart disease Father 48  . Hypertension Father   . Cancer Maternal Grandfather     colon cancer (70's)  . Diabetes Paternal Grandmother   . Diabetes Paternal Grandfather   . Cancer Cousin     breast   Social History  Substance Use Topics  . Smoking status: Former Research scientist (life sciences)  . Smokeless tobacco: Never Used  . Alcohol Use: 0.0 oz/week    0 Standard drinks or equivalent per week     Comment: occassionally   OB History    Gravida Para Term Preterm AB TAB SAB Ectopic Multiple Living            2     Review of Systems  All other systems reviewed and are negative.     Allergies  Penicillins  Home Medications   Prior to Admission medications   Medication Sig Start Date End Date Taking? Authorizing Provider  amLODipine (NORVASC) 5 MG tablet Take 1 tablet (5 mg total) by  mouth daily. 09/29/14  Yes Alycia Rossetti, MD  HYDROcodone-acetaminophen (NORCO) 5-325 MG per tablet Take 1 tablet by mouth every 6 (six) hours as needed for moderate pain. 09/29/14  Yes Alycia Rossetti, MD  omeprazole (PRILOSEC) 20 MG capsule Take 1 capsule (20 mg total) by mouth 2 (two) times daily. Patient taking differently: Take 20 mg by mouth 2 (two) times daily as needed (Acid Reflux). PRN 04/29/14  Yes Tanna Furry, MD  SUMAtriptan (IMITREX) 100 MG tablet Take 1 tablet (100 mg total) by mouth every 2 (two) hours as needed for migraine. May repeat in 2 hours if headache persists or recurs. 09/29/14  Yes Alycia Rossetti, MD  topiramate (TOPAMAX) 25 MG tablet Take 1-2  tablet at bedtime 09/29/14  Yes Alycia Rossetti, MD  ALPRAZolam Duanne Moron) 0.5 MG tablet Take 1 tablet (0.5 mg total) by mouth 2 (two) times daily as needed for anxiety. 09/29/14   Alycia Rossetti, MD  ibuprofen (ADVIL,MOTRIN) 200 MG tablet Take 800 mg by mouth every 8 (eight) hours as needed for headache or mild pain (headaches).     Historical Provider, MD  levofloxacin (LEVAQUIN) 500 MG tablet Take 1 tablet (500 mg total) by mouth daily. 11/16/14   [name], MD  predniSONE (DELTASONE) 20 MG tablet 3 tabs  poqday 1-2, 2 tabs poqday 3-4, 1 tab poqday 5-6 11/16/14   [name], MD   BP 107/78 mmHg  Pulse 59  Temp(Src) 97.8 F (36.6 C) (Oral)  Resp 16  Ht 5\' 7"  (1.702 m)  Wt 190 lb (86.183 kg)  BMI 29.75 kg/m2  SpO2 97% Physical Exam  Constitutional: She is oriented to person, place, and time. She appears well-developed and well-nourished.  HENT:  Head: Normocephalic and atraumatic.  Eyes: Conjunctivae and EOM are normal. Pupils are equal, round, and reactive to light.  Neck: Normal range of motion. Neck supple.  Cardiovascular: Normal rate and regular rhythm.   Pulmonary/Chest: Effort normal and breath sounds normal.  Abdominal: Soft. Bowel sounds are normal.  Musculoskeletal: Normal range of motion.   Neurological: She is alert and oriented to person, place, and time.  Slight photophobia  Skin: Skin is warm and dry.  Psychiatric: She has a normal mood and affect. Her behavior is normal.  Nursing note and vitals reviewed.   ED Course  Procedures (including critical care time) Labs Review Labs Reviewed - No data to display  Imaging Review No results found. I have personally reviewed and evaluated these images and lab results as part of my medical decision-making.   EKG Interpretation None      MDM   Final diagnoses:  Migraine without status migrainosus, not intractable, unspecified migraine type    Patient feels better after IV fluids, IV medication.  No evidence of a stroke. She has primary care follow-up.    Nat Christen, MD 12/06/14 [phone]

## 2014-12-22 ENCOUNTER — Encounter: Payer: Self-pay | Admitting: Family Medicine

## 2014-12-24 ENCOUNTER — Ambulatory Visit: Payer: PRIVATE HEALTH INSURANCE | Admitting: Dietician

## 2015-01-02 ENCOUNTER — Encounter (HOSPITAL_COMMUNITY): Payer: Self-pay | Admitting: *Deleted

## 2015-01-02 ENCOUNTER — Emergency Department (HOSPITAL_COMMUNITY)
Admission: EM | Admit: 2015-01-02 | Discharge: 2015-01-02 | Disposition: A | Discharge status: Home / Self Care | Payer: Managed Care, Other (non HMO) | Attending: Emergency Medicine | Admitting: Emergency Medicine

## 2015-01-02 ENCOUNTER — Emergency Department (HOSPITAL_COMMUNITY): Payer: Managed Care, Other (non HMO)

## 2015-01-02 DIAGNOSIS — F419 Anxiety disorder, unspecified: Secondary | ICD-10-CM | POA: Diagnosis not present

## 2015-01-02 DIAGNOSIS — Z79899 Other long term (current) drug therapy: Secondary | ICD-10-CM | POA: Insufficient documentation

## 2015-01-02 DIAGNOSIS — Z792 Long term (current) use of antibiotics: Secondary | ICD-10-CM | POA: Diagnosis not present

## 2015-01-02 DIAGNOSIS — F329 Major depressive disorder, single episode, unspecified: Secondary | ICD-10-CM | POA: Diagnosis not present

## 2015-01-02 DIAGNOSIS — Z880 Allergy status to penicillin: Secondary | ICD-10-CM | POA: Insufficient documentation

## 2015-01-02 DIAGNOSIS — Z872 Personal history of diseases of the skin and subcutaneous tissue: Secondary | ICD-10-CM | POA: Diagnosis not present

## 2015-01-02 DIAGNOSIS — Z87891 Personal history of nicotine dependence: Secondary | ICD-10-CM | POA: Diagnosis not present

## 2015-01-02 DIAGNOSIS — R079 Chest pain, unspecified: Secondary | ICD-10-CM | POA: Diagnosis present

## 2015-01-02 DIAGNOSIS — I10 Essential (primary) hypertension: Secondary | ICD-10-CM | POA: Diagnosis not present

## 2015-01-02 LAB — BASIC METABOLIC PANEL
ANION GAP: 7 (ref 5–15)
BUN: 10 mg/dL (ref 6–20)
CHLORIDE: 108 mmol/L (ref 101–111)
CO2: 25 mmol/L (ref 22–32)
Calcium: 8.9 mg/dL (ref 8.9–10.3)
Creatinine, Ser: 0.57 mg/dL (ref 0.44–1.00)
GFR calc Af Amer: >60 mL/min (ref >60)
GLUCOSE: 109 mg/dL — AB (ref 65–99)
POTASSIUM: 4.3 mmol/L (ref 3.5–5.1)
Sodium: 140 mmol/L (ref 135–145)

## 2015-01-02 LAB — CBC
HEMATOCRIT: 38.2 % (ref 36.0–46.0)
HEMOGLOBIN: 12.7 g/dL (ref 12.0–15.0)
MCH: 30 pg (ref 26.0–34.0)
MCHC: 33.2 g/dL (ref 30.0–36.0)
MCV: 90.3 fL (ref 78.0–100.0)
Platelets: 257 10*3/uL (ref 150–400)
RBC: 4.23 MIL/uL (ref 3.87–5.11)
RDW: 13.4 % (ref 11.5–15.5)
WBC: 5.7 10*3/uL (ref 4.0–10.5)

## 2015-01-02 LAB — TROPONIN I: Troponin I: <0.03 ng/mL (ref <0.031)

## 2015-01-02 MED ORDER — ONDANSETRON HCL 4 MG/2ML IJ SOLN
4.0000 mg | Freq: Once | INTRAMUSCULAR | Status: AC
  Administered 2015-01-02: 4 mg via INTRAVENOUS
  Filled 2015-01-02: qty 2

## 2015-01-02 MED ORDER — ALPRAZOLAM 1 MG PO TABS: 1.0000 mg | ORAL_TABLET | Freq: Two times a day (BID) | ORAL | Status: DC | PRN

## 2015-01-02 MED ORDER — ACETAMINOPHEN 325 MG PO TABS
650.0000 mg | ORAL_TABLET | ORAL | Status: DC | PRN
  Administered 2015-01-02: 650 mg via ORAL
  Filled 2015-01-02: qty 2

## 2015-01-02 MED ORDER — PROMETHAZINE HCL 12.5 MG PO TABS
25.0000 mg | ORAL_TABLET | Freq: Once | ORAL | Status: AC
  Administered 2015-01-02: 25 mg via ORAL
  Filled 2015-01-02: qty 2

## 2015-01-02 MED ORDER — NITROGLYCERIN 0.4 MG SL SUBL
0.4000 mg | SUBLINGUAL_TABLET | SUBLINGUAL | Status: AC | PRN
  Administered 2015-01-02 (×3): 0.4 mg via SUBLINGUAL
  Filled 2015-01-02: qty 1

## 2015-01-02 NOTE — Discharge Instructions (Signed)
Generalized Anxiety Disorder Generalized anxiety disorder (GAD) is a mental disorder. It interferes with life functions, including relationships, work, and school. GAD is different from normal anxiety, which everyone experiences at some point in their lives in response to specific life events and activities. Normal anxiety actually helps [place] prepare for and get through these life events and activities. Normal anxiety goes away after the event or activity is over.  GAD causes anxiety that is not necessarily related to specific events or activities. It also causes excess anxiety in proportion to specific events or activities. The anxiety associated with GAD is also difficult to control. GAD can vary from mild to severe. People with severe GAD can have intense waves of anxiety with physical symptoms (panic attacks).  SYMPTOMS The anxiety and worry associated with GAD are difficult to control. This anxiety and worry are related to many life events and activities and also occur more days than not for 6 months or longer. People with GAD also have three or more of the following symptoms (one or more in children):  Restlessness.   Fatigue.  Difficulty concentrating.   Irritability.  Muscle tension.  Difficulty sleeping or unsatisfying sleep. DIAGNOSIS GAD is diagnosed through an assessment by your health care provider. Your health care provider will ask you questions aboutyour mood,physical symptoms, and events in your life. Your health care provider may ask you about your medical history and use of alcohol or drugs, including prescription medicines. Your health care provider may also do a physical exam and blood tests. Certain medical conditions and the use of certain substances can cause symptoms similar to those associated with GAD. Your health care provider may refer you to a mental health specialist for further evaluation. TREATMENT The following therapies are usually used to treat GAD:    Medication. Antidepressant medication usually is prescribed for long-term daily control. Antianxiety medicines may be added in severe cases, especially when panic attacks occur.   Talk therapy (psychotherapy). Certain types of talk therapy can be helpful in treating GAD by providing support, education, and guidance. A form of talk therapy called cognitive behavioral therapy can teach you healthy ways to think about and react to daily life events and activities.  Stress managementtechniques. These include yoga, meditation, and exercise and can be very helpful when they are practiced regularly. A mental health specialist can help determine which treatment is best for you. Some people see improvement with one therapy. However, other people require a combination of therapies.   This information is not intended to replace advice given to you by your health care provider. Make sure you discuss any questions you have with your health care provider.   Document Released: 06/17/2012 Document Revised: 03/13/2014 Document Reviewed: 06/17/2012 Elsevier Interactive Patient Education 2016 Elsevier Inc. Nonspecific Chest Pain  Chest pain can be caused by many different conditions. There is always a chance that your pain could be related to something serious, such as a heart attack or a blood clot in your lungs. Chest pain can also be caused by conditions that are not life-threatening. If you have chest pain, it is very important to follow up with your health care provider. CAUSES  Chest pain can be caused by:  Heartburn.  Pneumonia or bronchitis.  Anxiety or stress.  Inflammation around your heart (pericarditis) or lung (pleuritis or pleurisy).  A blood clot in your lung.  A collapsed lung (pneumothorax). It can develop suddenly on its own (spontaneous pneumothorax) or from trauma to the chest.  Shingles infection (varicella-zoster virus).  Heart attack.  Damage to the bones, muscles, and  cartilage that make up your chest wall. This can include:  Bruised bones due to injury.  Strained muscles or cartilage due to frequent or repeated coughing or overwork.  Fracture to one or more ribs.  Sore cartilage due to inflammation (costochondritis). RISK FACTORS  Risk factors for chest pain may include:  Activities that increase your risk for trauma or injury to your chest.  Respiratory infections or conditions that cause frequent coughing.  Medical conditions or overeating that can cause heartburn.  Heart disease or family history of heart disease.  Conditions or health behaviors that increase your risk of developing a blood clot.  Having had chicken pox (varicella zoster). SIGNS AND SYMPTOMS Chest pain can feel like:  Burning or tingling on the surface of your chest or deep in your chest.  Crushing, pressure, aching, or squeezing pain.  Dull or sharp pain that is worse when you move, cough, or take a deep breath.  Pain that is also felt in your back, neck, shoulder, or arm, or pain that spreads to any of these areas. Your chest pain may come and go, or it may stay constant. DIAGNOSIS Lab tests or other studies may be needed to find the cause of your pain. Your health care provider may have you take a test called an ambulatory ECG (electrocardiogram). An ECG records your heartbeat patterns at the time the test is performed. You may also have other tests, such as:  Transthoracic echocardiogram (TTE). During echocardiography, sound waves are used to create a picture of all of the heart structures and to look at how blood flows through your heart.  Transesophageal echocardiogram (TEE).This is a more advanced imaging test that obtains images from inside your body. It allows your health care provider to see your heart in finer detail.  Cardiac monitoring. This allows your health care provider to monitor your heart rate and rhythm in real time.  Holter monitor. This is a  portable device that records your heartbeat and can help to diagnose abnormal heartbeats. It allows your health care provider to track your heart activity for several days, if needed.  Stress tests. These can be done through exercise or by taking medicine that makes your heart beat more quickly.  Blood tests.  Imaging tests. TREATMENT  Your treatment depends on what is causing your chest pain. Treatment may include:  Medicines. These may include:  Acid blockers for heartburn.  Anti-inflammatory medicine.  Pain medicine for inflammatory conditions.  Antibiotic medicine, if an infection is present.  Medicines to dissolve blood clots.  Medicines to treat coronary artery disease.  Supportive care for conditions that do not require medicines. This may include:  Resting.  Applying heat or cold packs to injured areas.  Limiting activities until pain decreases. HOME CARE INSTRUCTIONS  If you were prescribed an antibiotic medicine, finish it all even if you start to feel better.  Avoid any activities that bring on chest pain.  Do not use any tobacco products, including cigarettes, chewing tobacco, or electronic cigarettes. If you need help quitting, ask your health care provider.  Do not drink alcohol.  Take medicines only as directed by your health care provider.  Keep all follow-up visits as directed by your health care provider. This is important. This includes any further testing if your chest pain does not go away.  If heartburn is the cause for your chest pain, you may be told  to keep your head raised (elevated) while sleeping. This reduces the chance that acid will go from your stomach into your esophagus.  Make lifestyle changes as directed by your health care provider. These may include:  Getting regular exercise. Ask your health care provider to suggest some activities that are safe for you.  Eating a heart-healthy diet. A registered dietitian can help you to learn  healthy eating options.  Maintaining a healthy weight.  Managing diabetes, if necessary.  Reducing stress. SEEK MEDICAL CARE IF:  Your chest pain does not go away after treatment.  You have a rash with blisters on your chest.  You have a fever. SEEK IMMEDIATE MEDICAL CARE IF:   Your chest pain is worse.  You have an increasing cough, or you cough up blood.  You have severe abdominal pain.  You have severe weakness.  You faint.  You have chills.  You have sudden, unexplained chest discomfort.  You have sudden, unexplained discomfort in your arms, back, neck, or jaw.  You have shortness of breath at any time.  You suddenly start to sweat, or your skin gets clammy.  You feel nauseous or you vomit.  You suddenly feel light-headed or dizzy.  Your heart begins to beat quickly, or it feels like it is skipping beats. These symptoms may represent a serious problem that is an emergency. Do not wait to see if the symptoms will go away. Get medical help right away. Call your local emergency services (911 in the [place] Do not drive yourself to the hospital.   This information is not intended to replace advice given to you by your health care provider. Make sure you discuss any questions you have with your health care provider.   Document Released: 11/30/2004 Document Revised: 03/13/2014 Document Reviewed: 09/26/2013 Elsevier Interactive Patient Education [employer].

## 2015-01-02 NOTE — ED Notes (Signed)
Patient requesting Nausea medication. PA aware and in to see patient.

## 2015-01-02 NOTE — ED Notes (Signed)
Pt states she started having chest pain this morning around 0630. Pt describes pain and heaviness with "fluttering" in her chest and severe nausea. Denies any hx of heart problems.

## 2015-01-02 NOTE — ED Notes (Signed)
Patient with no complaints at this time. Respirations even and unlabored. Skin warm/dry. Discharge instructions reviewed with patient at this time. Patient given opportunity to voice concerns/ask questions. IV removed per policy and band-aid applied to site. Patient discharged at this time and left Emergency Department with steady gait.  

## 2015-01-02 NOTE — ED Provider Notes (Signed)
CSN: [id number]     Arrival date & time 01/02/15  0807 History   First MD Initiated Contact with Patient 01/02/15 [phone]     Chief Complaint  Patient presents with  . Chest Pain     (Consider location/radiation/quality/duration/timing/severity/associated sxs/prior Treatment) Patient is a 49 y.o. female presenting with chest pain. The history is provided by the patient. No language interpreter was used.  Chest Pain Pain location:  L chest Pain quality: aching   Pain radiates to:  Does not radiate Pain radiates to the back: no   Pain severity:  No pain Onset quality:  Gradual Timing:  Constant Progression:  Worsening Chronicity:  New Context: not breathing   Worsened by:  Nothing tried Associated symptoms: anxiety   Risk factors: hypertension   Pt thinks she may be having anxiety issues.  Pt noticed chest tightness around 6:30 am.   Past Medical History  Diagnosis Date  . Depression   . Hypertension   . Anxiety   . Rosacea    Past Surgical History  Procedure Laterality Date  . Appendectomy    . Cholecystectomy    . Tonsillectomy    . Lumbar      ruptured disc in lumbar taken out  . Back surgery    . Appendectomy    . Cholecystectomy    . Abdominal hysterectomy      non cancerous   Family History  Problem Relation Age of Onset  . Diabetes Father   . Heart disease Father 43  . Hypertension Father   . Cancer Maternal Grandfather     colon cancer (70's)  . Diabetes Paternal Grandmother   . Diabetes Paternal Grandfather   . Cancer Cousin     breast   Social History  Substance Use Topics  . Smoking status: Former Research scientist (life sciences)  . Smokeless tobacco: Never Used  . Alcohol Use: 0.0 oz/week    0 Standard drinks or equivalent per week     Comment: occassionally   OB History    Gravida Para Term Preterm AB TAB SAB Ectopic Multiple Living            2     Review of Systems  Cardiovascular: Positive for chest pain.  All other systems reviewed and are  negative.     Allergies  Penicillins  Home Medications   Prior to Admission medications   Medication Sig Start Date End Date Taking? Authorizing Provider  ALPRAZolam Duanne Moron) 0.5 MG tablet Take 1 tablet (0.5 mg total) by mouth 2 (two) times daily as needed for anxiety. 09/29/14   Alycia Rossetti, MD  amLODipine (NORVASC) 5 MG tablet Take 1 tablet (5 mg total) by mouth daily. 09/29/14   Alycia Rossetti, MD  HYDROcodone-acetaminophen (NORCO) 5-325 MG per tablet Take 1 tablet by mouth every 6 (six) hours as needed for moderate pain. 09/29/14   Alycia Rossetti, MD  ibuprofen (ADVIL,MOTRIN) 200 MG tablet Take 800 mg by mouth every 8 (eight) hours as needed for headache or mild pain (headaches).     Historical Provider, MD  levofloxacin (LEVAQUIN) 500 MG tablet Take 1 tablet (500 mg total) by mouth daily. 11/16/14   [name], MD  omeprazole (PRILOSEC) 20 MG capsule Take 1 capsule (20 mg total) by mouth 2 (two) times daily. Patient taking differently: Take 20 mg by mouth 2 (two) times daily as needed (Acid Reflux). PRN 04/29/14   Tanna Furry, MD  predniSONE (DELTASONE) 20 MG tablet 3 tabs poqday 1-2,  2 tabs poqday 3-4, 1 tab poqday 5-6 11/16/14   [name], MD  SUMAtriptan (IMITREX) 100 MG tablet Take 1 tablet (100 mg total) by mouth every 2 (two) hours as needed for migraine. May repeat in 2 hours if headache persists or recurs. 09/29/14   Alycia Rossetti, MD  topiramate (TOPAMAX) 25 MG tablet Take 1-2  tablet at bedtime 09/29/14   Alycia Rossetti, MD   BP 112/97 mmHg  Pulse 74  Temp(Src) 97.7 F (36.5 C) (Oral)  Resp 11  Ht 5\' 6"  (1.676 m)  Wt 192 lb (87.091 kg)  BMI 31.00 kg/m2  SpO2 100% Physical Exam  Constitutional: She is oriented to person, place, and time. She appears well-developed and well-nourished.  HENT:  Head: Normocephalic and atraumatic.  Right Ear: External ear normal.  Left Ear: External ear normal.  Nose: Nose normal.  Mouth/Throat: Oropharynx is clear  and moist.  Eyes: Conjunctivae and EOM are normal. Pupils are equal, round, and reactive to light.  Neck: Normal range of motion.  Cardiovascular: Normal rate and normal heart sounds.   Pulmonary/Chest: Effort normal.  Abdominal: Soft. She exhibits no distension.  Musculoskeletal: Normal range of motion.  Neurological: She is alert and oriented to person, place, and time.  Skin: Skin is warm.  Psychiatric: She has a normal mood and affect.  Nursing note and vitals reviewed.   ED Course  Procedures (including critical care time) Labs Review Labs Reviewed  BASIC METABOLIC PANEL - Abnormal; Notable for the following:    Glucose, Bld 109 (*)    All other components within normal limits  CBC  TROPONIN I    Imaging Review Dg Chest 2 View  01/02/2015  CLINICAL DATA:  Chest pain today, nausea, vomiting, SOB EXAM: CHEST - 2 VIEW COMPARISON:  04/29/2014 FINDINGS: Lungs are clear. Heart size and mediastinal contours are within normal limits. No effusion. Visualized skeletal structures are unremarkable. Surgical clips right upper abdomen. IMPRESSION: No acute cardiopulmonary disease. Electronically Signed   By: Lucrezia Europe M.D.   On: 01/02/2015 09:11   I have personally reviewed and evaluated these images and lab results as part of my medical decision-making.   EKG Interpretation   Date/Time:  Saturday January 02 2015 08:14:04 EDT Ventricular Rate:  73 PR Interval:  139 QRS Duration: 84 QT Interval:  387 QTC Calculation: 426 R Axis:   66 Text Interpretation:  Sinus rhythm Baseline wander in lead(s) II III aVR  aVF V6 No significant change since last tracing Confirmed by LIU MD, DANA  ([id number]) on 01/02/2015 8:17:08 AM       MDM  Chest pain sounds atypical for cardiac, no symptoms or findings of pulmomary disease.    Ekg is unchanged.   Troponin is negative x 2.   Pt advised to follow up with her primary MD.   I will give rx for xanax.   Pt is advised to call her MD on Monday to  schedule recheck   Final diagnoses:  Nonspecific chest pain    Xanax An After Visit Summary was printed and given to the patient.    [place], PA-C 01/02/15 Nellie Liu, MD 01/03/15 [phone]

## 2015-02-06 ENCOUNTER — Encounter (HOSPITAL_COMMUNITY): Payer: Self-pay | Admitting: Emergency Medicine

## 2015-02-06 ENCOUNTER — Emergency Department (HOSPITAL_COMMUNITY)
Admission: EM | Admit: 2015-02-06 | Discharge: 2015-02-06 | Disposition: A | Discharge status: Home / Self Care | Payer: Managed Care, Other (non HMO) | Attending: Emergency Medicine | Admitting: Emergency Medicine

## 2015-02-06 DIAGNOSIS — I10 Essential (primary) hypertension: Secondary | ICD-10-CM | POA: Diagnosis not present

## 2015-02-06 DIAGNOSIS — Z872 Personal history of diseases of the skin and subcutaneous tissue: Secondary | ICD-10-CM | POA: Insufficient documentation

## 2015-02-06 DIAGNOSIS — F329 Major depressive disorder, single episode, unspecified: Secondary | ICD-10-CM | POA: Diagnosis not present

## 2015-02-06 DIAGNOSIS — F419 Anxiety disorder, unspecified: Secondary | ICD-10-CM | POA: Insufficient documentation

## 2015-02-06 DIAGNOSIS — Z79899 Other long term (current) drug therapy: Secondary | ICD-10-CM | POA: Diagnosis not present

## 2015-02-06 DIAGNOSIS — H9201 Otalgia, right ear: Secondary | ICD-10-CM | POA: Diagnosis present

## 2015-02-06 DIAGNOSIS — Z880 Allergy status to penicillin: Secondary | ICD-10-CM | POA: Diagnosis not present

## 2015-02-06 DIAGNOSIS — H6501 Acute serous otitis media, right ear: Secondary | ICD-10-CM | POA: Insufficient documentation

## 2015-02-06 DIAGNOSIS — Z87891 Personal history of nicotine dependence: Secondary | ICD-10-CM | POA: Diagnosis not present

## 2015-02-06 MED ORDER — AZITHROMYCIN 250 MG PO TABS: ORAL_TABLET | ORAL | Status: DC

## 2015-02-06 MED ORDER — KETOROLAC TROMETHAMINE 60 MG/2ML IM SOLN
60.0000 mg | Freq: Once | INTRAMUSCULAR | Status: AC
  Administered 2015-02-06: 60 mg via INTRAMUSCULAR
  Filled 2015-02-06: qty 2

## 2015-02-06 MED ORDER — ONDANSETRON 4 MG PO TBDP
4.0000 mg | ORAL_TABLET | Freq: Once | ORAL | Status: AC
  Administered 2015-02-06: 4 mg via ORAL
  Filled 2015-02-06: qty 1

## 2015-02-06 MED ORDER — NAPROXEN 500 MG PO TABS: 500.0000 mg | ORAL_TABLET | Freq: Two times a day (BID) | ORAL | Status: DC

## 2015-02-06 MED ORDER — HYDROCODONE-ACETAMINOPHEN 5-325 MG PO TABS
1.0000 | ORAL_TABLET | Freq: Once | ORAL | Status: AC
Start: 2015-02-06 — End: 2015-02-06
  Administered 2015-02-06: 1 via ORAL
  Filled 2015-02-06: qty 1

## 2015-02-06 NOTE — Discharge Instructions (Signed)
If you were given medicines take as directed.  If you are on coumadin or contraceptives realize their levels and effectiveness is altered by many different medicines.  If you have any reaction (rash, tongues swelling, other) to the medicines stop taking and see a physician.    If your blood pressure was elevated in the ER make sure you follow up for management with a primary doctor or return for chest pain, shortness of breath or stroke symptoms.  Please follow up as directed and return to the ER or see a physician for new or worsening symptoms.  Thank you. Filed Vitals:   02/06/15 1000  BP: 116/78  Pulse: 94  Temp: 98.8 F (37.1 C)  TempSrc: Oral  Resp: 16  Height: 5\' 6"  (1.676 m)  Weight: 195 lb (88.451 kg)  SpO2: 100%

## 2015-02-06 NOTE — ED Notes (Signed)
Pt. D/c papers/prescription given and reviewed. Pt. Verbalized understanding and has no further questions.

## 2015-02-06 NOTE — ED Provider Notes (Signed)
CSN: [id number]     Arrival date & time 02/06/15  [id number] History   First MD Initiated Contact with Patient 02/06/15 1000     Chief Complaint  Patient presents with  . Otalgia     (Consider location/radiation/quality/duration/timing/severity/associated sxs/prior Treatment) HPI Comments: 49 year old female with history of stroke, migraine, high blood pressure presents with worsening right ear pain and nausea since yesterday. Patient has had a history of ear infection in the past. Patient also had congestion recently. Symptoms fairly constant. No significant headache or vomiting. No fever.  Patient is a 49 y.o. female presenting with ear pain. The history is provided by the patient.  Otalgia Associated symptoms: no abdominal pain, no congestion, no fever, no headaches, no neck pain, no rash and no vomiting     Past Medical History  Diagnosis Date  . Depression   . Hypertension   . Anxiety   . Rosacea    Past Surgical History  Procedure Laterality Date  . Appendectomy    . Cholecystectomy    . Tonsillectomy    . Lumbar      ruptured disc in lumbar taken out  . Back surgery    . Appendectomy    . Cholecystectomy    . Abdominal hysterectomy      non cancerous   Family History  Problem Relation Age of Onset  . Diabetes Father   . Heart disease Father 18  . Hypertension Father   . Cancer Maternal Grandfather     colon cancer (70's)  . Diabetes Paternal Grandmother   . Diabetes Paternal Grandfather   . Cancer Cousin     breast   Social History  Substance Use Topics  . Smoking status: Former Research scientist (life sciences)  . Smokeless tobacco: Never Used  . Alcohol Use: 0.0 oz/week    0 Standard drinks or equivalent per week     Comment: occassionally   OB History    Gravida Para Term Preterm AB TAB SAB Ectopic Multiple Living            2     Review of Systems  Constitutional: Positive for appetite change. Negative for fever and chills.  HENT: Positive for ear pain. Negative for  congestion.   Eyes: Negative for visual disturbance.  Respiratory: Negative for shortness of breath.   Cardiovascular: Negative for chest pain.  Gastrointestinal: Negative for vomiting and abdominal pain.  Genitourinary: Negative for dysuria and flank pain.  Musculoskeletal: Negative for back pain, neck pain and neck stiffness.  Skin: Negative for rash.  Neurological: Negative for light-headedness and headaches.      Allergies  Penicillins  Home Medications   Prior to Admission medications   Medication Sig Start Date End Date Taking? Authorizing Provider  ALPRAZolam Duanne Moron) 1 MG tablet Take 1 tablet (1 mg total) by mouth 2 (two) times daily as needed for anxiety. 01/02/15  Yes Hollace Kinnier Sofia, PA-C  amLODipine (NORVASC) 5 MG tablet Take 1 tablet (5 mg total) by mouth daily. 09/29/14  Yes Alycia Rossetti, MD  ibuprofen (ADVIL,MOTRIN) 200 MG tablet Take 800 mg by mouth every 8 (eight) hours as needed for headache or mild pain (headaches).    Yes Historical Provider, MD  omeprazole (PRILOSEC) 20 MG capsule Take 1 capsule (20 mg total) by mouth 2 (two) times daily. Patient taking differently: Take 20 mg by mouth 2 (two) times daily as needed (Acid Reflux). PRN 04/29/14  Yes Tanna Furry, MD  OVER THE COUNTER MEDICATION Place 3 drops into  the right ear every 4 (four) hours as needed (ear pain). Over the counter ear drops.   Yes Historical Provider, MD  pseudoephedrine-acetaminophen (TYLENOL SINUS) 30-500 MG TABS tablet Take 1 tablet by mouth every 4 (four) hours as needed (congestion).   Yes Historical Provider, MD  SUMAtriptan (IMITREX) 100 MG tablet Take 1 tablet (100 mg total) by mouth every 2 (two) hours as needed for migraine. May repeat in 2 hours if headache persists or recurs. 09/29/14  Yes Alycia Rossetti, MD  topiramate (TOPAMAX) 25 MG tablet Take 1-2  tablet at bedtime Patient taking differently: Take 25 mg by mouth at bedtime and may repeat dose one time if needed. FOR HEADACHES  09/29/14  Yes Alycia Rossetti, MD  azithromycin (ZITHROMAX Z-PAK) 250 MG tablet 2 po day one, then 1 daily x 4 days 02/06/15   Elnora Morrison, MD  naproxen (NAPROSYN) 500 MG tablet Take 1 tablet (500 mg total) by mouth 2 (two) times daily. 02/06/15   Elnora Morrison, MD   BP 116/78 mmHg  Pulse 94  Temp(Src) 98.8 F (37.1 C) (Oral)  Resp 16  Ht 5\' 6"  (1.676 m)  Wt 195 lb (88.451 kg)  BMI 31.49 kg/m2  SpO2 100% Physical Exam  Constitutional: She is oriented to person, place, and time. She appears well-developed and well-nourished.  HENT:  Head: Normocephalic and atraumatic.  Patient has mild fluid and significant erythema right tympanic membrane no drainage no pain with moving ear lobe on the right. Neck supple. No mastoid tenderness. Posterior pharynx benign.  Eyes: Conjunctivae are normal. Right eye exhibits no discharge. Left eye exhibits no discharge.  Neck: Normal range of motion. Neck supple. No tracheal deviation present.  Cardiovascular: Normal rate and regular rhythm.   Pulmonary/Chest: Effort normal and breath sounds normal.  Musculoskeletal: She exhibits no edema.  Neurological: She is alert and oriented to person, place, and time.  Skin: Skin is warm. No rash noted.  Psychiatric: She has a normal mood and affect.  Nursing note and vitals reviewed.   ED Course  Procedures (including critical care time) Labs Review Labs Reviewed - No data to display  Imaging Review No results found. I have personally reviewed and evaluated these images and lab results as part of my medical decision-making.   EKG Interpretation None      MDM   Final diagnoses:  Right acute serous otitis media, recurrence not specified   Patient presents with clinically acute otitis media versus sinus infection. Pain and nausea medicines in the ER, pain prescription naproxen for home and discuss watching weight before starting antibiotics.  Results and differential diagnosis were discussed with the  patient/parent/guardian. Xrays were independently reviewed by myself.  Close follow up outpatient was discussed, comfortable with the plan.   Medications  ketorolac (TORADOL) injection 60 mg (not administered)  HYDROcodone-acetaminophen (NORCO/VICODIN) 5-325 MG per tablet 1 tablet (not administered)  ondansetron (ZOFRAN-ODT) disintegrating tablet 4 mg (not administered)    Filed Vitals:   02/06/15 1000 02/06/15 1114  BP: 116/78 109/66  Pulse: 94 95  Temp: 98.8 F (37.1 C) 98.7 F (37.1 C)  TempSrc: Oral Oral  Resp: 16 15  Height: 5\' 6"  (1.676 m)   Weight: 195 lb (88.451 kg)   SpO2: 100% 98%    Final diagnoses:  Right acute serous otitis media, recurrence not specified        Elnora Morrison, MD 02/06/15 [phone]

## 2015-02-06 NOTE — ED Notes (Signed)
Pt states that she has been having right ear pain with nausea for the past few days with a lot of congestion.

## 2015-02-12 ENCOUNTER — Encounter: Payer: Self-pay | Admitting: Family Medicine

## 2015-02-12 ENCOUNTER — Ambulatory Visit (INDEPENDENT_AMBULATORY_CARE_PROVIDER_SITE_OTHER): Payer: Managed Care, Other (non HMO) | Admitting: Family Medicine

## 2015-02-12 VITALS — BP 120/88 | HR 92 | Temp 98.9°F | Resp 18 | Ht 66.5 in | Wt 190.0 lb

## 2015-02-12 DIAGNOSIS — H6591 Unspecified nonsuppurative otitis media, right ear: Secondary | ICD-10-CM

## 2015-02-12 MED ORDER — CEFDINIR 300 MG PO CAPS: 300.0000 mg | ORAL_CAPSULE | Freq: Two times a day (BID) | ORAL | Status: DC

## 2015-02-12 NOTE — Progress Notes (Signed)
Subjective:    Patient ID: [name], female    DOB: 07/26/1965, 49 y.o.   MRN: [id number]  HPI [name] to ER 1 week ago and was diagnosed with otitis media in right ear and was given a zpack.  Symptoms are no better.  Continues to complain of pain in the right ear, pressure, and that the ear feels stopped up like she is under water.  She also reports symptoms consistent with eustachian tube dysfunction. On examination, the right tympanic membrane is erythematous with a serous effusion in the middle ear. Past Medical History  Diagnosis Date  . Depression   . Hypertension   . Anxiety   . Rosacea    Past Surgical History  Procedure Laterality Date  . Appendectomy    . Cholecystectomy    . Tonsillectomy    . Lumbar      ruptured disc in lumbar taken out  . Back surgery    . Appendectomy    . Cholecystectomy    . Abdominal hysterectomy      non cancerous   Current Outpatient Prescriptions on File Prior to Visit  Medication Sig Dispense Refill  . ALPRAZolam (XANAX) 1 MG tablet Take 1 tablet (1 mg total) by mouth 2 (two) times daily as needed for anxiety. 10 tablet 0  . amLODipine (NORVASC) 5 MG tablet Take 1 tablet (5 mg total) by mouth daily. 30 tablet 6  . ibuprofen (ADVIL,MOTRIN) 200 MG tablet Take 800 mg by mouth every 8 (eight) hours as needed for headache or mild pain (headaches).     . naproxen (NAPROSYN) 500 MG tablet Take 1 tablet (500 mg total) by mouth 2 (two) times daily. 15 tablet 0  . omeprazole (PRILOSEC) 20 MG capsule Take 1 capsule (20 mg total) by mouth 2 (two) times daily. (Patient taking differently: Take 20 mg by mouth 2 (two) times daily as needed (Acid Reflux). PRN) 60 capsule 0  . OVER THE COUNTER MEDICATION Place 3 drops into the right ear every 4 (four) hours as needed (ear pain). Over the counter ear drops.    . pseudoephedrine-acetaminophen (TYLENOL SINUS) 30-500 MG TABS tablet Take 1 tablet by mouth every 4 (four) hours as needed (congestion).    .  SUMAtriptan (IMITREX) 100 MG tablet Take 1 tablet (100 mg total) by mouth every 2 (two) hours as needed for migraine. May repeat in 2 hours if headache persists or recurs. 10 tablet 1  . topiramate (TOPAMAX) 25 MG tablet Take 1-2  tablet at bedtime (Patient taking differently: Take 25 mg by mouth at bedtime and may repeat dose one time if needed. FOR HEADACHES) 60 tablet 3   No current facility-administered medications on file prior to visit.   Allergies  Allergen Reactions  . Penicillins Hives    Has patient had a PCN reaction causing immediate rash, facial/tongue/throat swelling, SOB or lightheadedness with hypotension: Yes Has patient had a PCN reaction causing severe rash involving mucus membranes or skin necrosis: No Has patient had a PCN reaction that required hospitalization No Has patient had a PCN reaction occurring within the last 10 years: No If all of the above answers are "NO", then may proceed with Cephalosporin use.     Social History   Social History  . Marital Status: Divorced    Spouse Name: N/A  . Number of Children: N/A  . Years of Education: N/A   Occupational History  . Not on file.   Social History Main Topics  .  Smoking status: Former Research scientist (life sciences)  . Smokeless tobacco: Never Used  . Alcohol Use: 0.0 oz/week    0 Standard drinks or equivalent per week     Comment: occassionally  . Drug Use: No  . Sexual Activity: Yes   Other Topics Concern  . Not on file   Social History Narrative       Review of Systems  All other systems reviewed and are negative.      Objective:   Physical Exam  HENT:  Right Ear: External ear and ear canal normal. Tympanic membrane is injected and erythematous. A middle ear effusion is present.  Left Ear: Tympanic membrane, external ear and ear canal normal.  Nose: Nose normal.  Mouth/Throat: Oropharynx is clear and moist. No oropharyngeal exudate.  Cardiovascular: Normal rate, regular rhythm and normal heart sounds.     Pulmonary/Chest: Effort normal and breath sounds normal.  Vitals reviewed.         Assessment & Plan:  Right otitis media with effusion - Plan: cefdinir (OMNICEF) 300 MG capsule  patient has taken cephalosporins successfully before without any allergy. Therefore I will try Omnicef 300 mg by mouth twice a day for 10 days. Also want her to take Zyrtec and Sudafed to try to dry up the middle ear effusion and assist with resolution of eustachian tube dysfunction. I've also recommended Flonase 2 sprays each nostril daily again to try to facilitate resolution of eustachian tube dysfunction which is contributing to the middle ear effusion

## 2015-04-14 ENCOUNTER — Other Ambulatory Visit: Payer: Self-pay | Admitting: Family Medicine

## 2015-04-15 NOTE — Telephone Encounter (Signed)
Refill appropriate and filled per protocol. 

## 2015-04-29 ENCOUNTER — Ambulatory Visit (INDEPENDENT_AMBULATORY_CARE_PROVIDER_SITE_OTHER): Payer: Managed Care, Other (non HMO) | Admitting: Internal Medicine

## 2015-04-29 ENCOUNTER — Encounter: Payer: Self-pay | Admitting: Internal Medicine

## 2015-04-29 VITALS — BP 124/66 | HR 83 | Ht 66.5 in | Wt 194.0 lb

## 2015-04-29 DIAGNOSIS — R002 Palpitations: Secondary | ICD-10-CM

## 2015-04-29 NOTE — Progress Notes (Signed)
Cardiology Office Note   Date:  04/29/2015   ID:  [name], DOB [birth year], MRN [id number]  PCP:  Odette Fraction, MD  Cardiologist:   Dorris Carnes, MD    F/U of CP and "spells"   History of Present Illness: [name] is a 50 y.o. female with a history of chest pain  I saw her in May 2016  Felt atypical  Cardiopulmonary stress tst showed limitiation by wt.   Pt hasa been under increased stress recently (work, family)  Has had  Funny feeling in chest  Not sure what going on  Last 3 to 4 min  One time every 1 to 2 wks   NO trigger   Does get dizzy when having  No syncope     Began about 2 mon ago   Otherwise she has been active    Hurt ankle BP had been high  Now low      Outpatient Prescriptions Prior to Visit  Medication Sig Dispense Refill  . ALPRAZolam (XANAX) 1 MG tablet Take 1 tablet (1 mg total) by mouth 2 (two) times daily as needed for anxiety. 10 tablet 0  . amLODipine (NORVASC) 5 MG tablet Take 1 tablet (5 mg total) by mouth daily. 30 tablet 6  . ibuprofen (ADVIL,MOTRIN) 200 MG tablet Take 800 mg by mouth every 8 (eight) hours as needed for headache or mild pain (headaches).     . naproxen (NAPROSYN) 500 MG tablet Take 1 tablet (500 mg total) by mouth 2 (two) times daily. 15 tablet 0  . pseudoephedrine-acetaminophen (TYLENOL SINUS) 30-500 MG TABS tablet Take 1 tablet by mouth every 4 (four) hours as needed (congestion).    . SUMAtriptan (IMITREX) 100 MG tablet TAKE 1 TABLET BY MOUTH EVERY 2 HOURS AS NEEDED FOR MIGRAINE. MAY REPEAT IN 2 HOURS IF HEADACHE RECURS 10 tablet 0  . cefdinir (OMNICEF) 300 MG capsule Take 1 capsule (300 mg total) by mouth 2 (two) times daily. 20 capsule 0  . omeprazole (PRILOSEC) 20 MG capsule Take 1 capsule (20 mg total) by mouth 2 (two) times daily. (Patient taking differently: Take 20 mg by mouth 2 (two) times daily as needed (Acid Reflux). PRN) 60 capsule 0  . OVER THE COUNTER MEDICATION Place 3 drops into the right ear every 4 (four) hours  as needed (ear pain). Over the counter ear drops.    . topiramate (TOPAMAX) 25 MG tablet Take 1-2  tablet at bedtime (Patient not taking: Reported on 04/29/2015) 60 tablet 3   No facility-administered medications prior to visit.     Allergies:   Penicillins   Past Medical History  Diagnosis Date  . Depression   . Hypertension   . Anxiety   . Rosacea     Past Surgical History  Procedure Laterality Date  . Appendectomy    . Cholecystectomy    . Tonsillectomy    . Lumbar      ruptured disc in lumbar taken out  . Back surgery    . Appendectomy    . Cholecystectomy    . Abdominal hysterectomy      non cancerous     Social History:  The patient  reports that she has quit smoking. She has never used smokeless tobacco. She reports that she drinks alcohol. She reports that she does not use illicit drugs.   Family History:  The patient's family history includes Cancer in her cousin and maternal grandfather; Diabetes in her father, paternal grandfather, and paternal  grandmother; Heart disease (age of onset: 42) in her father; Hypertension in her father.    ROS:  Please see the history of present illness. All other systems are reviewed and  Negative to the above problem except as noted.    PHYSICAL EXAM: VS:  BP 124/66 mmHg  Pulse 83  Ht 5' 6.5" (1.689 m)  Wt 87.998 kg (194 lb)  BMI 30.85 kg/m2  GEN: Obese 50 yo in no acute distress HEENT: normal Neck: no JVD, carotid bruits, or masses Cardiac: RRR; no murmurs, rubs, or gallops,no edema  Respiratory:  clear to auscultation bilaterally, normal work of breathing GI: soft, nontender, nondistended, + BS  No hepatomegaly  MS: no deformity Moving all extremities   Skin: warm and dry, no rash Neuro:  Strength and sensation are intact Psych: euthymic mood, full affect   EKG:  EKG is not ordered today.   Lipid Panel    Component Value Date/Time   CHOL 147 05/19/2013 0927   TRIG 119 05/19/2013 0927   HDL 57 05/19/2013 0927     CHOLHDL 2.6 05/19/2013 0927   VLDL 24 05/19/2013 0927   LDLCALC 66 05/19/2013 0927      Wt Readings from Last 3 Encounters:  04/29/15 87.998 kg (194 lb)  02/12/15 86.183 kg (190 lb)  02/06/15 88.451 kg (195 lb)      ASSESSMENT AND PLAN:  1  Spells  [place] palpitations  PT does get dizzy when occur I would like to set up for an event monitor   Avoid caffeine  Stay hydrated   F/U based on test resultss   Continue to stay active    2.  CP  Pt really denies this    3.  HCM  Encouraged her to stay active  Try to cut back on intake to lose wt  (met with dietician for a couple visits but no f/u many  Signed, Dorris Carnes, MD  04/29/2015 4:26 PM    [address], [place], [place]  [address] Phone: [phone]; Fax: [phone]

## 2015-04-29 NOTE — Patient Instructions (Signed)
Your physician recommends that you continue on your current medications as directed. Please refer to the Current Medication list given to you today. Your physician has recommended that you wear an event monitor. Event monitors are medical devices that record the heart's electrical activity. Doctors most often us these monitors to diagnose arrhythmias. Arrhythmias are problems with the speed or rhythm of the heartbeat. The monitor is a small, portable device. You can wear one while you do your normal daily activities. This is usually used to diagnose what is causing palpitations/syncope (passing out).  Follow up with your physician will depend on test results.   

## 2015-05-03 ENCOUNTER — Other Ambulatory Visit: Payer: Self-pay | Admitting: Internal Medicine

## 2015-05-03 ENCOUNTER — Ambulatory Visit (INDEPENDENT_AMBULATORY_CARE_PROVIDER_SITE_OTHER): Payer: Managed Care, Other (non HMO)

## 2015-05-03 DIAGNOSIS — R002 Palpitations: Secondary | ICD-10-CM

## 2015-05-03 DIAGNOSIS — R42 Dizziness and giddiness: Secondary | ICD-10-CM

## 2015-06-02 ENCOUNTER — Encounter: Payer: Self-pay | Admitting: Family Medicine

## 2015-07-05 ENCOUNTER — Encounter: Payer: Self-pay | Admitting: Family Medicine

## 2015-07-05 ENCOUNTER — Ambulatory Visit (INDEPENDENT_AMBULATORY_CARE_PROVIDER_SITE_OTHER): Payer: Managed Care, Other (non HMO) | Admitting: Family Medicine

## 2015-07-05 ENCOUNTER — Other Ambulatory Visit: Payer: Self-pay | Admitting: Family Medicine

## 2015-07-05 VITALS — BP 128/74 | HR 78 | Temp 98.4°F | Resp 18 | Ht 66.5 in | Wt 191.0 lb

## 2015-07-05 DIAGNOSIS — F419 Anxiety disorder, unspecified: Secondary | ICD-10-CM | POA: Diagnosis not present

## 2015-07-05 DIAGNOSIS — Z1159 Encounter for screening for other viral diseases: Secondary | ICD-10-CM | POA: Diagnosis not present

## 2015-07-05 NOTE — Progress Notes (Signed)
Subjective:    Patient ID: [name], female    DOB: Nov 29, 1965, 50 y.o.   MRN: [id number]  HPI  Patient has a history of panic attacks. She has been admitted and worked up for chest pain which ultimately was discovered to be anxiety attacks. She continues to have worsening anxiety compounded by insomnia, depression, anhedonia. This is been ongoing for a year and has recently become unbearable. 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 She denies any suicidal ideation. However the stress at work is exacerbating her migraines and she is now getting almost daily migraine headaches. Her boyfriend is also been diagnosed with hepatitis C and she is requesting hepatitis C screening. In the past, she has tried Effexor, as well as Lexapro for anxiety and depression and has taken each of these for up to 3 months with no benefit. She is failed all other preventative medication she is tried Past Medical History  Diagnosis Date  . Depression   . Hypertension   . Anxiety   . Rosacea    Past Surgical History  Procedure Laterality Date  . Appendectomy    . Cholecystectomy    . Tonsillectomy    . Lumbar      ruptured disc in lumbar taken out  . Back surgery    . Appendectomy    . Cholecystectomy    . Abdominal hysterectomy      non cancerous   Current Outpatient Prescriptions on File Prior to Visit  Medication Sig Dispense Refill  . ALPRAZolam (XANAX) 1 MG tablet Take 1 tablet (1 mg total) by mouth 2 (two) times daily as needed for anxiety. 10 tablet 0  . amLODipine (NORVASC) 5 MG tablet Take 1 tablet (5 mg total) by mouth daily. 30 tablet 6  . ibuprofen (ADVIL,MOTRIN) 200 MG tablet Take 800 mg by mouth every 8 (eight) hours as needed for headache or mild pain (headaches).     . naproxen (NAPROSYN) 500 MG tablet Take 1 tablet (500 mg total) by mouth 2 (two) times daily. 15 tablet 0  . omeprazole (PRILOSEC) 20 MG capsule Take 20 mg by mouth 2 (two) times daily  as needed.    . SUMAtriptan (IMITREX) 100 MG tablet TAKE 1 TABLET BY MOUTH EVERY 2 HOURS AS NEEDED FOR MIGRAINE. MAY REPEAT IN 2 HOURS IF HEADACHE RECURS 10 tablet 0  . topiramate (TOPAMAX) 25 MG tablet Take 25 mg by mouth at bedtime. And take one tablet as needed for headaches     No current facility-administered medications on file prior to visit.   Allergies  Allergen Reactions  . Penicillins Hives    Has patient had a PCN reaction causing immediate rash, facial/tongue/throat swelling, SOB or lightheadedness with hypotension: Yes Has patient had a PCN reaction causing severe rash involving mucus membranes or skin necrosis: No Has patient had a PCN reaction that required hospitalization No Has patient had a PCN reaction occurring within the last 10 years: No If all of the above answers are "NO", then may proceed with Cephalosporin use.     Social History   Social History  . Marital Status: Divorced    Spouse Name: N/A  . Number of Children: N/A  . Years of Education: N/A   Occupational History  . Not on file.   Social History Main Topics  . Smoking status: Former Research scientist (life sciences)  . Smokeless tobacco: Never Used  . Alcohol Use: 0.0 oz/week    0 Standard  drinks or equivalent per week     Comment: occassionally  . Drug Use: No  . Sexual Activity: Yes   Other Topics Concern  . Not on file   Social History Narrative     Review of Systems  All other systems reviewed and are negative.      Objective:   Physical Exam  Constitutional: She is oriented to person, place, and time.  Cardiovascular: Normal rate, regular rhythm and normal heart sounds.   Pulmonary/Chest: Effort normal and breath sounds normal.  Neurological: She is alert and oriented to person, place, and time. She has normal reflexes.  Psychiatric: She has a normal mood and affect. Her behavior is normal. Judgment and thought content normal.  Vitals reviewed.         Assessment & Plan:  Need for hepatitis C  screening test - Plan: Hepatitis C Ab Reflex HCV RNA, QUANT  Anxiety  I'll screen the patient for hepatitis C per her request. I recommended that she start Trintellix 5 mg daily for 1 week and then increase to 10 mg daily thereafter and recheck in one month. Consider increasing Topamax for headaches if headaches persist. However I believe her migraines are exacerbated by her stress and hopefully they will also improve as we get her stress under better control

## 2015-07-06 LAB — HEPATITIS C ANTIBODY: HCV AB: NEGATIVE

## 2015-08-02 ENCOUNTER — Other Ambulatory Visit: Payer: Self-pay | Admitting: Family Medicine

## 2015-08-03 NOTE — Telephone Encounter (Signed)
Refill appropriate and filled per protocol. 

## 2015-08-05 ENCOUNTER — Emergency Department (HOSPITAL_COMMUNITY): Payer: Self-pay

## 2015-08-05 ENCOUNTER — Ambulatory Visit: Payer: Managed Care, Other (non HMO) | Admitting: Family Medicine

## 2015-08-05 ENCOUNTER — Emergency Department (HOSPITAL_COMMUNITY)
Admission: EM | Admit: 2015-08-05 | Discharge: 2015-08-05 | Disposition: A | Discharge status: Home / Self Care | Payer: Self-pay | Attending: Emergency Medicine | Admitting: Emergency Medicine

## 2015-08-05 ENCOUNTER — Encounter (HOSPITAL_COMMUNITY): Payer: Self-pay | Admitting: Emergency Medicine

## 2015-08-05 DIAGNOSIS — Z79899 Other long term (current) drug therapy: Secondary | ICD-10-CM | POA: Insufficient documentation

## 2015-08-05 DIAGNOSIS — F419 Anxiety disorder, unspecified: Secondary | ICD-10-CM

## 2015-08-05 DIAGNOSIS — R079 Chest pain, unspecified: Secondary | ICD-10-CM

## 2015-08-05 DIAGNOSIS — Z791 Long term (current) use of non-steroidal anti-inflammatories (NSAID): Secondary | ICD-10-CM | POA: Insufficient documentation

## 2015-08-05 DIAGNOSIS — I10 Essential (primary) hypertension: Secondary | ICD-10-CM | POA: Insufficient documentation

## 2015-08-05 DIAGNOSIS — F418 Other specified anxiety disorders: Secondary | ICD-10-CM | POA: Insufficient documentation

## 2015-08-05 DIAGNOSIS — Z87891 Personal history of nicotine dependence: Secondary | ICD-10-CM | POA: Insufficient documentation

## 2015-08-05 LAB — CBC
HCT: 43.2 % (ref 36.0–46.0)
HEMOGLOBIN: 14.3 g/dL (ref 12.0–15.0)
MCH: 28.7 pg (ref 26.0–34.0)
MCHC: 33.1 g/dL (ref 30.0–36.0)
MCV: 86.7 fL (ref 78.0–100.0)
Platelets: 287 10*3/uL (ref 150–400)
RBC: 4.98 MIL/uL (ref 3.87–5.11)
RDW: 13.3 % (ref 11.5–15.5)
WBC: 8 10*3/uL (ref 4.0–10.5)

## 2015-08-05 LAB — BASIC METABOLIC PANEL
ANION GAP: 6 (ref 5–15)
BUN: 12 mg/dL (ref 6–20)
CALCIUM: 9.2 mg/dL (ref 8.9–10.3)
CHLORIDE: 109 mmol/L (ref 101–111)
CO2: 24 mmol/L (ref 22–32)
CREATININE: 0.76 mg/dL (ref 0.44–1.00)
GFR calc non Af Amer: >60 mL/min (ref >60)
GLUCOSE: 113 mg/dL — AB (ref 65–99)
Potassium: 4 mmol/L (ref 3.5–5.1)
Sodium: 139 mmol/L (ref 135–145)

## 2015-08-05 LAB — TROPONIN I

## 2015-08-05 MED ORDER — ALPRAZOLAM 0.5 MG PO TABS
0.2500 mg | ORAL_TABLET | Freq: Once | ORAL | Status: AC
  Administered 2015-08-05: 0.25 mg via ORAL
  Filled 2015-08-05: qty 1

## 2015-08-05 MED ORDER — ONDANSETRON HCL 4 MG/2ML IJ SOLN
4.0000 mg | Freq: Once | INTRAMUSCULAR | Status: AC
  Administered 2015-08-05: 4 mg via INTRAVENOUS
  Filled 2015-08-05: qty 2

## 2015-08-05 MED ORDER — ALPRAZOLAM 0.25 MG PO TABS: 0.2500 mg | ORAL_TABLET | Freq: Two times a day (BID) | ORAL | Status: DC | PRN

## 2015-08-05 MED ORDER — KETOROLAC TROMETHAMINE 30 MG/ML IJ SOLN
30.0000 mg | Freq: Once | INTRAMUSCULAR | Status: AC
  Administered 2015-08-05: 30 mg via INTRAVENOUS
  Filled 2015-08-05: qty 1

## 2015-08-05 NOTE — Discharge Instructions (Signed)

## 2015-08-05 NOTE — ED Notes (Signed)
Pt states CP, SOB, and N/  started tonight, cp radiates to left arm. States hx of anxiety and has been under "a lot of stress".

## 2015-08-05 NOTE — ED Notes (Signed)
Pt ambulated to lobby with steady and even gait at this time. Pt verbalized understanding of d/c instructions, follow up care, and medications. No further needs voiced at this time.

## 2015-08-05 NOTE — ED Provider Notes (Signed)
CSN: [id number]     Arrival date & time 08/05/15  0231 History   First MD Initiated Contact with Patient 08/05/15 [phone]     Chief Complaint  Patient presents with  . Chest Pain     Patient is a 50 y.o. female presenting with chest pain. The history is provided by the patient.  Chest Pain Pain location:  L chest Pain quality: pressure   Pain radiates to:  L arm Pain severity:  Moderate Onset quality:  Gradual Duration:  4 hours Timing:  Constant Progression:  Unchanged Chronicity:  New Context: stress   Relieved by:  Nothing Exacerbated by: stress. Associated symptoms: shortness of breath   Associated symptoms: no fever, no lower extremity edema, no syncope and not vomiting   Risk factors: hypertension   Risk factors: no birth control, no coronary artery disease, no diabetes mellitus, no prior DVT/PE, no smoking and no surgery   Patient reports onset of left CP that radiates to her left shoulder about 4 hrs ago while she was in an argument She reports she has been under stress lately - lost her job, no insurance and arguing a lot with her boyfriend.  Tonight during an argument she had onset of CP/SOB Similar to prior episodes with anxiety   No h/o PE/DVT No travel/surgery She is a nonsmoker  Past Medical History  Diagnosis Date  . Depression   . Hypertension   . Anxiety   . Rosacea    Past Surgical History  Procedure Laterality Date  . Appendectomy    . Cholecystectomy    . Tonsillectomy    . Lumbar      ruptured disc in lumbar taken out  . Back surgery    . Appendectomy    . Cholecystectomy    . Abdominal hysterectomy      non cancerous  . Rotator cuff repair     Family History  Problem Relation Age of Onset  . Diabetes Father   . Heart disease Father 76  . Hypertension Father   . Cancer Maternal Grandfather     colon cancer (70's)  . Diabetes Paternal Grandmother   . Diabetes Paternal Grandfather   . Cancer Cousin     breast   Social History   Substance Use Topics  . Smoking status: Former Research scientist (life sciences)  . Smokeless tobacco: Never Used  . Alcohol Use: 0.0 oz/week    0 Standard drinks or equivalent per week     Comment: occassionally   OB History    Gravida Para Term Preterm AB TAB SAB Ectopic Multiple Living            2     Review of Systems  Constitutional: Negative for fever.  Respiratory: Positive for shortness of breath.   Cardiovascular: Positive for chest pain. Negative for syncope.  Gastrointestinal: Negative for vomiting.  Psychiatric/Behavioral: Negative for suicidal ideas. The patient is nervous/anxious.   All other systems reviewed and are negative.     Allergies  Penicillins  Home Medications   Prior to Admission medications   Medication Sig Start Date End Date Taking? Authorizing Provider  ALPRAZolam Duanne Moron) 1 MG tablet Take 1 tablet (1 mg total) by mouth 2 (two) times daily as needed for anxiety. 01/02/15   Fransico Meadow, PA-C  amLODipine (NORVASC) 5 MG tablet Take 1 tablet (5 mg total) by mouth daily. 09/29/14   Alycia Rossetti, MD  ibuprofen (ADVIL,MOTRIN) 200 MG tablet Take 800 mg by mouth every 8 (eight)  hours as needed for headache or mild pain (headaches).     Historical Provider, MD  naproxen (NAPROSYN) 500 MG tablet Take 1 tablet (500 mg total) by mouth 2 (two) times daily. 02/06/15   Elnora Morrison, MD  omeprazole (PRILOSEC) 20 MG capsule Take 20 mg by mouth 2 (two) times daily as needed.    Historical Provider, MD  SUMAtriptan (IMITREX) 100 MG tablet TAKE 1 TABLET BY MOUTH EVERY 2 HOURS AS NEEDED FOR MIGRAINE. MAY REPEAT IN 2 HOURS IF HEADACHE RECURS 04/15/15   [name], MD  topiramate (TOPAMAX) 25 MG tablet TAKE 1 TO 2 TABLETS BY MOUTH AT BEDTIME 08/03/15   [name], MD   BP 106/73 mmHg  Pulse 64  Temp(Src) 98.1 F (36.7 C)  Resp 12  Ht 5\' 6"  (1.676 m)  Wt 86.183 kg  BMI 30.68 kg/m2  SpO2 98% Physical Exam CONSTITUTIONAL: Well developed/well nourished HEAD:  Normocephalic/atraumatic EYES: EOMI/PERRL ENMT: Mucous membranes moist NECK: supple no meningeal signs SPINE/BACK:entire spine nontender CV: S1/S2 noted, no murmurs/rubs/gallops noted LUNGS: Lungs are clear to auscultation bilaterally, no apparent distress ABDOMEN: soft, nontender, no rebound or guarding, bowel sounds noted throughout abdomen GU:no cva tenderness NEURO: Pt is awake/alert/appropriate, moves all extremitiesx4.  No facial droop.   EXTREMITIES: pulses normal/equal, full ROM, no LE edema or tenderness SKIN: warm, color normal PSYCH: mildly anxious  ED Course  Procedures  Labs Review Labs Reviewed  BASIC METABOLIC PANEL - Abnormal; Notable for the following:    Glucose, Bld 113 (*)    All other components within normal limits  CBC  TROPONIN I    Imaging Review Dg Chest 2 View  08/05/2015  CLINICAL DATA:  Acute onset of central chest pain and tightness. Initial encounter. EXAM: CHEST  2 VIEW COMPARISON:  Chest radiograph performed 01/02/2015 FINDINGS: The lungs are well-aerated and clear. There is no evidence of focal opacification, pleural effusion or pneumothorax. The heart is normal in size; the mediastinal contour is within normal limits. No acute osseous abnormalities are seen. Clips are noted within the right upper quadrant, reflecting prior cholecystectomy. IMPRESSION: No acute cardiopulmonary process seen. Electronically Signed   By: Garald Balding M.D.   On: 08/05/2015 03:42   I have personally reviewed and evaluated these images and lab results as part of my medical decision-making.   EKG Interpretation   Date/Time:  Thursday August 05 2015 02:43:22 EDT Ventricular Rate:  71 PR Interval:  139 QRS Duration: 93 QT Interval:  385 QTC Calculation: 418 R Axis:   61 Text Interpretation:  Sinus rhythm Abnormal R-wave progression, early  transition No significant change since last tracing Confirmed by Christy Gentles   MD, Jovan Colligan ([id number]) on 08/05/2015 2:48:09 AM     4:11  AM Pt stable Initial workup unrevealing PERC negative She has seen cardiology before, had workup including stress test but also worn holter monitor  Pt stable in ED She admits to having this previously She requests xanax at discharge We discussed strict return precautions At this point, I have low suspicion for ACS/PE/Dissection given history/exam MDM   Final diagnoses:  Chest pain, unspecified chest pain type  Anxiety    Nursing notes including past medical history and social history reviewed and considered in documentation xrays/imaging reviewed by myself and considered during evaluation Labs/vital reviewed myself and considered during evaluation     Ripley Fraise, MD 08/05/15 [phone]

## 2015-08-26 ENCOUNTER — Telehealth: Payer: Self-pay | Admitting: Family Medicine

## 2015-08-26 MED ORDER — ALPRAZOLAM 0.5 MG PO TABS: 0.5000 mg | ORAL_TABLET | Freq: Four times a day (QID) | ORAL | Status: DC

## 2015-08-26 NOTE — Telephone Encounter (Signed)
Medication called/sent to requested pharmacy and pt aware 

## 2015-08-26 NOTE — Telephone Encounter (Signed)
One rx for xanax 0.5 mg po q 8 hrs prn (30)

## 2015-08-26 NOTE — Telephone Encounter (Signed)
Patient calling to say her nerves are really bad, and she has not insurance, is there anyway something can be called in for her? [phone] (H)

## 2015-08-27 ENCOUNTER — Ambulatory Visit: Payer: Managed Care, Other (non HMO) | Admitting: Internal Medicine

## 2015-08-29 ENCOUNTER — Emergency Department (HOSPITAL_COMMUNITY)
Admission: EM | Admit: 2015-08-29 | Discharge: 2015-08-30 | Disposition: A | Discharge status: Other Acute Inpatient Hospital | Payer: Managed Care, Other (non HMO) | Attending: Emergency Medicine | Admitting: Emergency Medicine

## 2015-08-29 ENCOUNTER — Encounter (HOSPITAL_COMMUNITY): Payer: Self-pay | Admitting: Emergency Medicine

## 2015-08-29 DIAGNOSIS — I10 Essential (primary) hypertension: Secondary | ICD-10-CM | POA: Insufficient documentation

## 2015-08-29 DIAGNOSIS — Z79899 Other long term (current) drug therapy: Secondary | ICD-10-CM | POA: Insufficient documentation

## 2015-08-29 DIAGNOSIS — Z87891 Personal history of nicotine dependence: Secondary | ICD-10-CM | POA: Insufficient documentation

## 2015-08-29 DIAGNOSIS — F339 Major depressive disorder, recurrent, unspecified: Secondary | ICD-10-CM

## 2015-08-29 DIAGNOSIS — F3340 Major depressive disorder, recurrent, in remission, unspecified: Secondary | ICD-10-CM | POA: Insufficient documentation

## 2015-08-29 LAB — COMPREHENSIVE METABOLIC PANEL
ALK PHOS: 115 U/L (ref 38–126)
ALT: 56 U/L — ABNORMAL HIGH (ref 14–54)
ANION GAP: 6 (ref 5–15)
AST: 40 U/L (ref 15–41)
Albumin: 3.9 g/dL (ref 3.5–5.0)
BUN: 7 mg/dL (ref 6–20)
CALCIUM: 8.8 mg/dL — AB (ref 8.9–10.3)
CO2: 27 mmol/L (ref 22–32)
Chloride: 107 mmol/L (ref 101–111)
Creatinine, Ser: 0.59 mg/dL (ref 0.44–1.00)
GFR calc non Af Amer: >60 mL/min (ref >60)
Glucose, Bld: 115 mg/dL — ABNORMAL HIGH (ref 65–99)
Potassium: 4 mmol/L (ref 3.5–5.1)
SODIUM: 140 mmol/L (ref 135–145)
Total Bilirubin: 0.7 mg/dL (ref 0.3–1.2)
Total Protein: 7 g/dL (ref 6.5–8.1)

## 2015-08-29 LAB — CBC WITH DIFFERENTIAL/PLATELET
Basophils Absolute: 0 10*3/uL (ref 0.0–0.1)
Basophils Relative: 0 %
EOS ABS: 0.1 10*3/uL (ref 0.0–0.7)
EOS PCT: 2 %
HCT: 40.2 % (ref 36.0–46.0)
HEMOGLOBIN: 13.4 g/dL (ref 12.0–15.0)
LYMPHS ABS: 2 10*3/uL (ref 0.7–4.0)
Lymphocytes Relative: 26 %
MCH: 29.5 pg (ref 26.0–34.0)
MCHC: 33.3 g/dL (ref 30.0–36.0)
MCV: 88.4 fL (ref 78.0–100.0)
MONO ABS: 0.5 10*3/uL (ref 0.1–1.0)
MONOS PCT: 6 %
Neutro Abs: 5 10*3/uL (ref 1.7–7.7)
Neutrophils Relative %: 66 %
PLATELETS: 262 10*3/uL (ref 150–400)
RBC: 4.55 MIL/uL (ref 3.87–5.11)
RDW: 13.5 % (ref 11.5–15.5)
WBC: 7.7 10*3/uL (ref 4.0–10.5)

## 2015-08-29 LAB — RAPID URINE DRUG SCREEN, HOSP PERFORMED
Amphetamines: NOT DETECTED
Barbiturates: NOT DETECTED
Benzodiazepines: POSITIVE — AB
COCAINE: NOT DETECTED
OPIATES: NOT DETECTED
TETRAHYDROCANNABINOL: NOT DETECTED

## 2015-08-29 LAB — ETHANOL: Alcohol, Ethyl (B): <5 mg/dL (ref <5)

## 2015-08-29 LAB — ACETAMINOPHEN LEVEL: Acetaminophen (Tylenol), Serum: <10 ug/mL — ABNORMAL LOW (ref 10–30)

## 2015-08-29 LAB — SALICYLATE LEVEL: Salicylate Lvl: <4 mg/dL (ref 2.8–30.0)

## 2015-08-29 MED ORDER — PANTOPRAZOLE SODIUM 40 MG PO TBEC
40.0000 mg | DELAYED_RELEASE_TABLET | Freq: Every day | ORAL | Status: DC
  Administered 2015-08-30: 40 mg via ORAL
  Filled 2015-08-29: qty 1

## 2015-08-29 MED ORDER — AMLODIPINE BESYLATE 5 MG PO TABS
5.0000 mg | ORAL_TABLET | Freq: Every day | ORAL | Status: DC
  Administered 2015-08-30: 5 mg via ORAL
  Filled 2015-08-29: qty 1

## 2015-08-29 MED ORDER — IBUPROFEN 800 MG PO TABS
800.0000 mg | ORAL_TABLET | Freq: Once | ORAL | Status: AC
  Administered 2015-08-29: 800 mg via ORAL
  Filled 2015-08-29: qty 1

## 2015-08-29 MED ORDER — ONDANSETRON 4 MG PO TBDP
4.0000 mg | ORAL_TABLET | Freq: Once | ORAL | Status: AC
  Administered 2015-08-29: 4 mg via ORAL
  Filled 2015-08-29: qty 1

## 2015-08-29 NOTE — ED Notes (Signed)
Patient speaking with counselor at this time.

## 2015-08-29 NOTE — ED Notes (Signed)
Patient's daughter came to pick up the keys.  Patient states to let her have everything that security locked up.  Daughter, Junius Argyle, will take the patient's checkbook, wallet, with contents of ss card, driver's license.  Then took cell phone , charger, and set of keys.

## 2015-08-29 NOTE — ED Notes (Signed)
Pt states she has had a recent breakup and feels her boyfriend of 9 months does not love her.  States he will not talk to her and she just could not take anymore today and began texting her family stating she was going to kill herself.  Family threatened to call police for commitment and pt voluntarily came to ER.  Pt tearful, but talkative.  States she just wants someone to listen to her.  Advised of psych hold process and pt agrees.

## 2015-08-29 NOTE — ED Notes (Signed)
Pt wanded by security. 

## 2015-08-29 NOTE — ED Notes (Addendum)
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  Pt reports taking 8 xanax pills 2 hours in attempt to "go to sleep and not wake up." Pt reports wanting help.  Pt denies HI, hallucinations.

## 2015-08-29 NOTE — ED Provider Notes (Signed)
CSN: [id number]     Arrival date & time 08/29/15  1734 History   First MD Initiated Contact with Patient 08/29/15 1750     Chief Complaint  Patient presents with  . V70.1     (Consider location/radiation/quality/duration/timing/severity/associated sxs/prior Treatment) Patient is a 50 y.o. female presenting with altered mental status. The history is provided by the patient (Patient states he is depressed and suicidal and took 8 Xanax last night to herself).  Altered Mental Status Presenting symptoms: behavior changes   Presenting symptoms: no combativeness   Severity:  Severe Most recent episode:  More than 2 days ago Episode history:  Continuous Timing:  Constant Progression:  Worsening Chronicity:  Recurrent Context: not dementia   Associated symptoms: no abdominal pain, no hallucinations, no headaches, no rash and no seizures     Past Medical History  Diagnosis Date  . Depression   . Hypertension   . Anxiety   . Rosacea    Past Surgical History  Procedure Laterality Date  . Appendectomy    . Cholecystectomy    . Tonsillectomy    . Lumbar      ruptured disc in lumbar taken out  . Back surgery    . Appendectomy    . Cholecystectomy    . Abdominal hysterectomy      non cancerous  . Rotator cuff repair     Family History  Problem Relation Age of Onset  . Diabetes Father   . Heart disease Father 22  . Hypertension Father   . Cancer Maternal Grandfather     colon cancer (70's)  . Diabetes Paternal Grandmother   . Diabetes Paternal Grandfather   . Cancer Cousin     breast   Social History  Substance Use Topics  . Smoking status: Former Research scientist (life sciences)  . Smokeless tobacco: Never Used  . Alcohol Use: 0.0 oz/week    0 Standard drinks or equivalent per week     Comment: occassionally   OB History    Gravida Para Term Preterm AB TAB SAB Ectopic Multiple Living            2     Review of Systems  Constitutional: Negative for appetite change and fatigue.  HENT:  Negative for congestion, ear discharge and sinus pressure.   Eyes: Negative for discharge.  Respiratory: Negative for cough.   Cardiovascular: Negative for chest pain.  Gastrointestinal: Negative for abdominal pain and diarrhea.  Genitourinary: Negative for frequency and hematuria.  Musculoskeletal: Negative for back pain.  Skin: Negative for rash.  Neurological: Negative for seizures and headaches.  Psychiatric/Behavioral: Positive for suicidal ideas. Negative for hallucinations.      Allergies  Penicillins  Home Medications   Prior to Admission medications   Medication Sig Start Date End Date Taking? Authorizing Provider  amLODipine (NORVASC) 5 MG tablet Take 1 tablet (5 mg total) by mouth daily. 09/29/14  Yes Alycia Rossetti, MD  omeprazole (PRILOSEC) 20 MG capsule Take 20 mg by mouth 2 (two) times daily as needed (for acid reflux/GERD).    Yes Historical Provider, MD  topiramate (TOPAMAX) 25 MG tablet TAKE 1 TO 2 TABLETS BY MOUTH AT BEDTIME 08/03/15  Yes [name], MD   BP 149/98 mmHg  Pulse 104  Temp(Src) 98.4 F (36.9 C) (Oral)  Resp 18  Ht 5\' 7"  (1.702 m)  Wt 183 lb 8 oz (83.235 kg)  BMI 28.73 kg/m2  SpO2 99% Physical Exam  Constitutional: She is oriented to person, place,  and time. She appears well-developed.  HENT:  Head: Normocephalic.  Eyes: Conjunctivae and EOM are normal. No scleral icterus.  Neck: Neck supple. No thyromegaly present.  Cardiovascular: Normal rate and regular rhythm.  Exam reveals no gallop and no friction rub.   No murmur heard. Pulmonary/Chest: No stridor. She has no wheezes. She has no rales. She exhibits no tenderness.  Abdominal: She exhibits no distension. There is no tenderness. There is no rebound.  Musculoskeletal: Normal range of motion. She exhibits no edema.  Lymphadenopathy:    She has no cervical adenopathy.  Neurological: She is oriented to person, place, and time. She exhibits normal muscle tone. Coordination normal.   Patient is depressed and suicidal  Skin: No rash noted. No erythema.  Psychiatric: She has a normal mood and affect. Her behavior is normal.    ED Course  Procedures (including critical care time) Labs Review Labs Reviewed  COMPREHENSIVE METABOLIC PANEL - Abnormal; Notable for the following:    Glucose, Bld 115 (*)    Calcium 8.8 (*)    ALT 56 (*)    All other components within normal limits  URINE RAPID DRUG SCREEN, HOSP PERFORMED - Abnormal; Notable for the following:    Benzodiazepines POSITIVE (*)    All other components within normal limits  ACETAMINOPHEN LEVEL - Abnormal; Notable for the following:    Acetaminophen (Tylenol), Serum <10 (*)    All other components within normal limits  CBC WITH DIFFERENTIAL/PLATELET  ETHANOL  SALICYLATE LEVEL    Imaging Review No results found. I have personally reviewed and evaluated these images and lab results as part of my medical decision-making.   EKG Interpretation None      MDM   Final diagnoses:  None    Patient with depression suicidal. She was seen by behavioral health.  Behavior health decided she needed inpatient treatment. There are no beds available at behavior health right now. They will try to get her bed in the morning. The patient wants to be admitted    [name] Ferguson, MD 08/29/15 2129

## 2015-08-29 NOTE — BH Assessment (Signed)
Tele Assessment Note   [name] is an 50 y.o. female.  -Clinician reviewed note by [name], RN.  Patient took eight xanax today in an attempt to "go to sleep and not wake up."   Patient drove herself to [place] after she had taken eight xanax.  Patient had texted son, daughter, telling them good-bye.  Patient says that she has several stressors in her life.  She is currently unemployed.  Bills are piling up.  Patient is not sure how much longer her uncle is going to help her with her rent.  One of the main stressors that patient has is over a breakup with boyfriend.  She said that they broke up on Friday.  Today she went to his house and he would not talk to her.    When asked if she had intended to kill herself, patient says, "I wanted to go to sleep and see my dad who died awhile back, he is in heaven."  Patient has had two previous suicide attempts many years ago.  Both attempts were overdoses.  Patient denies any HI or A/V hallucinations.  Patient says she has about two mixed drinks or beers about twice in a month.  Patient drank that amount last night.  Patient denies other drug use.  Patient has had three previous inpatient experiences.  Two were when she was 26 and 50 years of age.  Patient says that she has been to [facility], [facility] and [facility].  There is no current outpatient provider.  -Clinician discussed patient care with Serena Colonel, NP who recommends inpatient psychiatric care.  There are no appropriate beds at [facility].  Pt may be considered if there are discharges tomorrow (06/26).  Pt will be referred out also.  Clinician discussed patient with Dr. Roderic Palau and let him know we are recommending inpatient care.  Diagnosis: MDD recurrent, severe  Past Medical History:  Past Medical History  Diagnosis Date  . Depression   . Hypertension   . Anxiety   . Rosacea     Past Surgical History  Procedure Laterality Date  . Appendectomy    . Cholecystectomy    . Tonsillectomy    .  Lumbar      ruptured disc in lumbar taken out  . Back surgery    . Appendectomy    . Cholecystectomy    . Abdominal hysterectomy      non cancerous  . Rotator cuff repair      Family History:  Family History  Problem Relation Age of Onset  . Diabetes Father   . Heart disease Father 24  . Hypertension Father   . Cancer Maternal Grandfather     colon cancer (70's)  . Diabetes Paternal Grandmother   . Diabetes Paternal Grandfather   . Cancer Cousin     breast    Social History:  reports that she has quit smoking. She has never used smokeless tobacco. She reports that she drinks alcohol. She reports that she does not use illicit drugs.  Additional Social History:  Alcohol / Drug Use Pain Medications: See PTA medication list Prescriptions: Topomate, blood pressure med., Imitrex, Prilosec.  Had prescription for xanax.  See PTA medication list Over the Counter: Benedryl History of alcohol / drug use?: Yes Substance #1 Name of Substance 1: ETOH (mixed drinks, beer) 1 - Age of First Use: 50 years of age 48 - Amount (size/oz): Two drinks at the most 1 - Frequency: About twice in a month 1 -  Duration: on-going 1 - Last Use / Amount: 06/24 in the evening  CIWA: CIWA-Ar BP: 149/98 mmHg Pulse Rate: 104 COWS:    PATIENT STRENGTHS: (choose at least two) Ability for insight Average or above average intelligence Capable of independent living Communication skills Supportive family/friends  Allergies:  Allergies  Allergen Reactions  . Penicillins Hives    Has patient had a PCN reaction causing immediate rash, facial/tongue/throat swelling, SOB or lightheadedness with hypotension: Yes Has patient had a PCN reaction causing severe rash involving mucus membranes or skin necrosis: No Has patient had a PCN reaction that required hospitalization No Has patient had a PCN reaction occurring within the last 10 years: No If all of the above answers are "NO", then may proceed with  Cephalosporin use.      Home Medications:  (Not in a hospital admission)  OB/GYN Status:  No LMP recorded. Patient has had a hysterectomy.  General Assessment Data Location of Assessment: [facility] TTS Assessment: In system Is this a Tele or Face-to-Face Assessment?: Tele Assessment Is this an Initial Assessment or a Re-assessment for this encounter?: Initial Assessment Marital status: Single Is patient pregnant?: No Pregnancy Status: No Living Arrangements: Alone Can pt return to current living arrangement?: Yes Admission Status: Voluntary Is patient capable of signing voluntary admission?: Yes Referral Source: Self/Family/Friend (Pt [facility] to [facility].) Insurance type: self pay     Crisis Care Plan Living Arrangements: Alone Name of Psychiatrist: None Name of Therapist: None  Education Status Is patient currently in school?: No Highest grade of school patient has completed: Associate's degree  Risk to self with the past 6 months Suicidal Ideation: Yes-Currently Present Has patient been a risk to self within the past 6 months prior to admission? : No Suicidal Intent: Yes-Currently Present Has patient had any suicidal intent within the past 6 months prior to admission? : No Is patient at risk for suicide?: Yes Suicidal Plan?: Yes-Currently Present Has patient had any suicidal plan within the past 6 months prior to admission? : Yes Specify Current Suicidal Plan: Overdose on xanax Access to Means: Yes Specify Access to Suicidal Means: Has prescription for xanax What has been your use of drugs/alcohol within the last 12 months?: Light ETOH use Previous Attempts/Gestures: Yes How many times?: 2 Other Self Harm Risks: None Triggers for Past Attempts: Family contact, Other personal contacts Intentional Self Injurious Behavior: None Family Suicide History: No Recent stressful life event(s): Conflict (Comment), Financial Problems, Job Loss (Breakup w/ bf; no money coming  in.) Persecutory voices/beliefs?: Yes Depression: Yes Depression Symptoms: Despondent, Tearfulness, Insomnia, Guilt, Feeling worthless/self pity, Loss of interest in usual pleasures, Isolating Substance abuse history and/or treatment for substance abuse?: No Suicide prevention information given to non-admitted patients: Not applicable  Risk to Others within the past 6 months Homicidal Ideation: No Does patient have any lifetime risk of violence toward others beyond the six months prior to admission? : No Thoughts of Harm to Others: No Current Homicidal Intent: No Current Homicidal Plan: No Access to Homicidal Means: No Identified Victim: No one History of harm to others?: No Assessment of Violence: None Noted Violent Behavior Description: None reported. Does patient have access to weapons?: No Criminal Charges Pending?: No Does patient have a court date: No Is patient on probation?: No  Psychosis Hallucinations: None noted Delusions: None noted  Mental Status Report Appearance/Hygiene: Unremarkable, In scrubs Eye Contact: Good Motor Activity: Freedom of movement, Unremarkable Speech: Logical/coherent Level of Consciousness: Quiet/awake Mood: Depressed, Empty, Despair, Anxious,  Sad Affect: Appropriate to circumstance, Depressed, Sad Anxiety Level: Panic Attacks Panic attack frequency: Once in a month or two. Most recent panic attack: 08/05/15 Thought Processes: Coherent, Relevant Judgement: Unimpaired Orientation: Person, Place, Time, Situation Obsessive Compulsive Thoughts/Behaviors: None  Cognitive Functioning Concentration: Decreased Memory: Remote Intact, Recent Impaired IQ: Average Insight: Fair Impulse Control: Poor Appetite: Poor Weight Loss: 8 Weight Gain: 0 Sleep: Decreased Total Hours of Sleep:  ("Four or five at the most.") Vegetative Symptoms: Staying in bed  ADLScreening [facility] Assessment Services) Patient's cognitive ability adequate to safely  complete daily activities?: Yes Patient able to express need for assistance with ADLs?: Yes Independently performs ADLs?: Yes (appropriate for developmental age)  Prior Inpatient Therapy Prior Inpatient Therapy: Yes Prior Therapy Dates: Over 20 years ago Prior Therapy Facilty/Provider(s): [facility], [facility] Reason for Treatment: SI  Prior Outpatient Therapy Prior Outpatient Therapy: Yes Prior Therapy Dates: 20 years ago Prior Therapy Facilty/Provider(s): Can't remember Reason for Treatment: Cannot recall Does patient have an ACCT team?: No Does patient have Intensive In-House Services?  : No Does patient have [facility] services? : No Does patient have P4CC services?: No  ADL Screening (condition at time of admission) Patient's cognitive ability adequate to safely complete daily activities?: Yes Patient able to express need for assistance with ADLs?: Yes Independently performs ADLs?: Yes (appropriate for developmental age)       Abuse/Neglect Assessment (Assessment to be complete while patient is alone) Physical Abuse: Denies Verbal Abuse: Yes, past (Comment) (Some emotional abuse in the past.) Sexual Abuse: Denies Exploitation of patient/patient's resources: Denies Self-Neglect: Denies     Regulatory affairs officer (For Healthcare) Does patient have an advance directive?: No Would patient like information on creating an advanced directive?: No - patient declined information    Additional Information 1:1 In Past 12 Months?: No CIRT Risk: No Elopement Risk: No Does patient have medical clearance?: Yes     Disposition:  Disposition Initial Assessment Completed for this Encounter: Yes Disposition of Patient: Inpatient treatment program, Referred to Type of inpatient treatment program: Adult Patient referred to:  (Pt to be reviewed with NP.)  Curlene Dolphin Ray 08/29/2015 8:27 PM

## 2015-08-30 ENCOUNTER — Other Ambulatory Visit: Payer: Self-pay

## 2015-08-30 ENCOUNTER — Encounter: Payer: Self-pay | Admitting: *Deleted

## 2015-08-30 ENCOUNTER — Inpatient Hospital Stay
Admission: EM | Admit: 2015-08-30 | Discharge: 2015-09-03 | DRG: 885 | Disposition: A | Discharge status: Home / Self Care | Payer: No Typology Code available for payment source | Source: Intra-hospital | Attending: Psychiatry | Admitting: Psychiatry

## 2015-08-30 DIAGNOSIS — F3181 Bipolar II disorder: Secondary | ICD-10-CM | POA: Diagnosis present

## 2015-08-30 DIAGNOSIS — R45851 Suicidal ideations: Secondary | ICD-10-CM

## 2015-08-30 DIAGNOSIS — Z915 Personal history of self-harm: Secondary | ICD-10-CM | POA: Diagnosis not present

## 2015-08-30 DIAGNOSIS — F332 Major depressive disorder, recurrent severe without psychotic features: Secondary | ICD-10-CM | POA: Diagnosis present

## 2015-08-30 DIAGNOSIS — Z87891 Personal history of nicotine dependence: Secondary | ICD-10-CM | POA: Diagnosis not present

## 2015-08-30 DIAGNOSIS — I10 Essential (primary) hypertension: Secondary | ICD-10-CM | POA: Diagnosis present

## 2015-08-30 DIAGNOSIS — Z880 Allergy status to penicillin: Secondary | ICD-10-CM | POA: Diagnosis not present

## 2015-08-30 DIAGNOSIS — Z8249 Family history of ischemic heart disease and other diseases of the circulatory system: Secondary | ICD-10-CM

## 2015-08-30 DIAGNOSIS — Z833 Family history of diabetes mellitus: Secondary | ICD-10-CM

## 2015-08-30 DIAGNOSIS — E538 Deficiency of other specified B group vitamins: Secondary | ICD-10-CM | POA: Diagnosis present

## 2015-08-30 DIAGNOSIS — T424X2A Poisoning by benzodiazepines, intentional self-harm, initial encounter: Secondary | ICD-10-CM | POA: Diagnosis present

## 2015-08-30 DIAGNOSIS — G43909 Migraine, unspecified, not intractable, without status migrainosus: Secondary | ICD-10-CM | POA: Diagnosis present

## 2015-08-30 DIAGNOSIS — Y92009 Unspecified place in unspecified non-institutional (private) residence as the place of occurrence of the external cause: Secondary | ICD-10-CM

## 2015-08-30 DIAGNOSIS — N390 Urinary tract infection, site not specified: Secondary | ICD-10-CM | POA: Diagnosis present

## 2015-08-30 DIAGNOSIS — L209 Atopic dermatitis, unspecified: Secondary | ICD-10-CM | POA: Diagnosis present

## 2015-08-30 DIAGNOSIS — Z79899 Other long term (current) drug therapy: Secondary | ICD-10-CM | POA: Diagnosis not present

## 2015-08-30 DIAGNOSIS — T50902A Poisoning by unspecified drugs, medicaments and biological substances, intentional self-harm, initial encounter: Secondary | ICD-10-CM | POA: Diagnosis present

## 2015-08-30 DIAGNOSIS — K219 Gastro-esophageal reflux disease without esophagitis: Secondary | ICD-10-CM | POA: Diagnosis present

## 2015-08-30 DIAGNOSIS — Z8673 Personal history of transient ischemic attack (TIA), and cerebral infarction without residual deficits: Secondary | ICD-10-CM | POA: Diagnosis not present

## 2015-08-30 MED ORDER — ALUM & MAG HYDROXIDE-SIMETH 200-200-20 MG/5ML PO SUSP: 30.0000 mL | ORAL | Status: DC | PRN

## 2015-08-30 MED ORDER — AMLODIPINE BESYLATE 5 MG PO TABS
5.0000 mg | ORAL_TABLET | Freq: Every day | ORAL | Status: DC
  Administered 2015-08-31: 5 mg via ORAL
  Filled 2015-08-30 (×4): qty 1

## 2015-08-30 MED ORDER — ACETAMINOPHEN 325 MG PO TABS
650.0000 mg | ORAL_TABLET | Freq: Four times a day (QID) | ORAL | Status: DC | PRN
  Administered 2015-08-30 – 2015-09-03 (×5): 650 mg via ORAL
  Filled 2015-08-30 (×5): qty 2

## 2015-08-30 MED ORDER — TOPIRAMATE 100 MG PO TABS
100.0000 mg | ORAL_TABLET | Freq: Every day | ORAL | Status: DC
  Administered 2015-08-30 – 2015-09-02 (×4): 100 mg via ORAL
  Filled 2015-08-30 (×4): qty 1

## 2015-08-30 MED ORDER — LORAZEPAM 1 MG PO TABS
1.0000 mg | ORAL_TABLET | Freq: Three times a day (TID) | ORAL | Status: DC | PRN
  Administered 2015-08-30: 1 mg via ORAL
  Filled 2015-08-30: qty 1

## 2015-08-30 MED ORDER — PANTOPRAZOLE SODIUM 40 MG PO TBEC
40.0000 mg | DELAYED_RELEASE_TABLET | Freq: Every day | ORAL | Status: DC
  Administered 2015-08-31 – 2015-09-03 (×4): 40 mg via ORAL
  Filled 2015-08-30 (×4): qty 1

## 2015-08-30 MED ORDER — HYDROXYZINE HCL 50 MG PO TABS
50.0000 mg | ORAL_TABLET | ORAL | Status: DC | PRN
  Administered 2015-08-30: 50 mg via ORAL
  Filled 2015-08-30: qty 1

## 2015-08-30 MED ORDER — MAGNESIUM HYDROXIDE 400 MG/5ML PO SUSP
30.0000 mL | Freq: Every day | ORAL | Status: DC | PRN
  Administered 2015-09-02: 30 mL via ORAL
  Filled 2015-08-30: qty 30

## 2015-08-30 MED ORDER — TRAZODONE HCL 100 MG PO TABS
100.0000 mg | ORAL_TABLET | Freq: Every day | ORAL | Status: DC
  Administered 2015-08-30 – 2015-09-02 (×4): 100 mg via ORAL
  Filled 2015-08-30 (×4): qty 1

## 2015-08-30 NOTE — Tx Team (Signed)
Initial Interdisciplinary Treatment Plan   PATIENT STRESSORS: Financial difficulties Loss of relationship Occupational concerns   PATIENT STRENGTHS: Ability for insight Average or above average intelligence Capable of independent living Communication skills General fund of knowledge Supportive family/friends Work skills   PROBLEM LIST: Problem List/Patient Goals Date to be addressed Date deferred Reason deferred Estimated date of resolution  Major Depressive disorder 08/30/2015     Suicidal ideation 08/30/2015     Depression 08/30/2015                                          DISCHARGE CRITERIA:  Improved stabilization in mood, thinking, and/or behavior  PRELIMINARY DISCHARGE PLAN: Outpatient therapy  PATIENT/FAMIILY INVOLVEMENT: This treatment plan has been presented to and reviewed with the patient, [name], and/or family member, .  The patient and family have been given the opportunity to ask questions and make suggestions.  [name] 08/30/2015, 3:54 PM

## 2015-08-30 NOTE — Progress Notes (Signed)
Spoke with RN Evelena Peat , as primary RN Megan unavailable, in efforts to coordinate care for this patient. Requesting follow up regarding patients chest pain, and if such has resolved with prn dose of ativan. Under review with [place] at this time, and Probation officer has requested that primary RN phone [facility] Elmyra Ricks in [place] to follow up so that patient may be placed if medically clear, and chest pain resolved. Elmyra Ricks # given to [place] RN- [phone].

## 2015-08-30 NOTE — Progress Notes (Signed)
Writer has spoke with pt charge nurse as requested by Surveyor, quantity, Marliss Coots. There were concerns in  regards to pts recent complaints of chest pain this AM. Pt has had an ECG completed which has confirmed no abnormalities. Pt was medicated this morning for anxiety and reports improvement of sx after being given ativan.     08/30/2015  Con Memos, MS, [place], LPCA Therapeutic Triage Specialist

## 2015-08-30 NOTE — ED Notes (Signed)
[place] transportation called for d/c transport.

## 2015-08-30 NOTE — ED Notes (Signed)
Breakfast tray given. °

## 2015-08-30 NOTE — BHH Group Notes (Signed)
[place] Group Notes:  (Nursing/MHT/Case Management/Adjunct)  Date:  08/30/2015  Time:  4:16 PM  Type of Therapy:  Psychoeducational Skills  Participation Level:  Active  Participation Quality:  Appropriate  Affect:  Appropriate  Cognitive:  Appropriate  Insight:  Appropriate  Engagement in Group:  Engaged  Modes of Intervention:  Discussion and Education  Summary of Progress/Problems:  Drake Leach 08/30/2015, 4:16 PM

## 2015-08-30 NOTE — ED Notes (Signed)
Pt accepted to [facility] by Dr. Montel Culver, call report to [phone]. Fax voluntary paperwork to [phone].

## 2015-08-30 NOTE — Progress Notes (Addendum)
Patient is to be admitted to [facility] by. Dr Bary Leriche.  Attending Physician will be Dr. Bary Leriche.   Patient has been assigned to room 311A, by [place] Nurse Marcie Bal.   Intake Paper Work has been signed and placed on patient chart.  ER staff is aware of the admission [name]  Patient Access).   08/30/2015 Con Memos, MS, [place], LPCA Therapeutic Triage Specialist

## 2015-08-30 NOTE — ED Notes (Signed)
Awake c/o chest tightness.  MD aware.

## 2015-08-30 NOTE — ED Notes (Signed)
Pt c/o mid "chest pressure" and anxiety. Pt reports she has hx of anxiety and needs medication to help her relax. Pt calm and cooperative during RN assessment. MD notified.

## 2015-08-30 NOTE — Progress Notes (Addendum)
[facility] with [facility] attempt. Pt took 8 xanax and called to tell children goodbye. Pt reports just wanting to go and be with father. Pt is denying SI at present and contracts for safety. Reports still wanting to go, but know she has to be here for her kids and grandkids. Pt stressors include a recent breakup with boyfriend and loss of job and bills are piling up. Pts uncle has been helping pay the mortgage but is unsure how much longer he can help d/t his failing health. Pt reports being tearful, sad, depressed and unable to concentrate. Pt came voluntary, but family informed her if she did not go they would IVC her. She is pleasant and cooperative with care and focus is on the breakup with her boyfriend this past Friday. Oriented pt to room and unit. Skin and contraband search completed and witnessed by GiGi, RN no contraband found. Pt has bruise to buttock, reports due to scratching. Encouragement and support offered. Pt receptive and remains safe on unit with q 15 min checks.

## 2015-08-30 NOTE — ED Provider Notes (Signed)
At episode of chest pain during the night which she feels is related to anxiety. ECG shows no acute changes. At this point, she has successfully metabolized in the alprazolam that she took yesterday. She is given a dose of lorazepam which she is to continue to using as needed for anxiety.   EKG Interpretation  Date/Time:  Monday August 30 2015 04:19:01 EDT Ventricular Rate:  62 PR Interval:  136 QRS Duration: 92 QT Interval:  424 QTC Calculation: 430 R Axis:   43 Text Interpretation:  Normal sinus rhythm Normal ECG When compared with ECG of 08/05/2015, No significant change was found Confirmed by [facility]  MD, Biannca Scantlin ([id number]) on 08/30/2015 4:29:05 AM        Delora Fuel, MD [id number]

## 2015-08-30 NOTE — Progress Notes (Signed)
Pt being reviewed for possible admission to [facility]. H&P and Assessment have been faxed to the [facility] for the charge nurse to review and provide bed assignment.      Con Memos, MS, [place], LPCA Therapeutic Triage Specialist

## 2015-08-31 DIAGNOSIS — F332 Major depressive disorder, recurrent severe without psychotic features: Secondary | ICD-10-CM

## 2015-08-31 MED ORDER — OXCARBAZEPINE 300 MG PO TABS
150.0000 mg | ORAL_TABLET | Freq: Two times a day (BID) | ORAL | Status: DC
  Administered 2015-08-31 – 2015-09-01 (×3): 150 mg via ORAL
  Filled 2015-08-31 (×3): qty 1

## 2015-08-31 MED ORDER — OXCARBAZEPINE 300 MG/5ML PO SUSP: 150.0000 mg | Freq: Two times a day (BID) | ORAL | Status: DC

## 2015-08-31 MED ORDER — HYDROXYZINE HCL 25 MG PO TABS
25.0000 mg | ORAL_TABLET | ORAL | Status: DC | PRN
  Administered 2015-08-31 – 2015-09-01 (×3): 25 mg via ORAL
  Filled 2015-08-31 (×3): qty 1

## 2015-08-31 MED ORDER — FLUVOXAMINE MALEATE 50 MG PO TABS
50.0000 mg | ORAL_TABLET | Freq: Every day | ORAL | Status: DC
  Administered 2015-08-31: 50 mg via ORAL
  Filled 2015-08-31: qty 1

## 2015-08-31 NOTE — Progress Notes (Signed)
Patient stated that her mind was racing, that she was stressed and nervous and requested to talk to the MD. Patient said, "All I wanna do is cry, I cry myself to sleep." Stated that she has had problems going to sleep at night mostly due to racing thoughts. She stated that she wants to get on the right medications. Emotional support provided. Patient encouraged to attend groups and to verbalize feelings to nursing staff. She verbalized understanding.

## 2015-08-31 NOTE — BHH Suicide Risk Assessment (Signed)
[facility] Admission Suicide Risk Assessment   Nursing information obtained from:  Patient Demographic factors:  Divorced or widowed, Caucasian, Low socioeconomic status, Unemployed, Living alone Current Mental Status:  Self-harm thoughts Loss Factors:  Loss of significant relationship, Financial problems / change in socioeconomic status Historical Factors:  NA Risk Reduction Factors:  Sense of responsibility to family, Positive social support, Positive coping skills or problem solving skills  Total Time spent with patient: 1 hour Principal Problem: Major depressive disorder, recurrent severe without psychotic features ([place]) Diagnosis:   Patient Active Problem List   Diagnosis Date Noted  . Major depressive disorder, recurrent severe without psychotic features ([place]) [F33.2] 08/30/2015  . Suicide attempt by drug ingestion ([place]) [T50.902A] 08/30/2015  . Suicidal ideation [R45.851] 08/30/2015  . Migraines [G43.909] 09/29/2014  . Headache(784.0) [R51] 05/19/2013  . Left-sided weakness [M62.89] 05/18/2013  . Numbness and tingling of left arm and leg [R20.2] 05/18/2013  . CVA (cerebral infarction) [I63.9] 05/18/2013  . Hypertension [I10]   . Anxiety [F41.9]    Subjective Data: deoression, anxiety, suicide attempt.  Continued Clinical Symptoms:  Alcohol Use Disorder Identification Test Final Score (AUDIT): 1 The "Alcohol Use Disorders Identification Test", Guidelines for Use in Primary Care, Second Edition.  World Pharmacologist [facility]). Score between 0-7:  no or low risk or alcohol related problems. Score between 8-15:  moderate risk of alcohol related problems. Score between 16-19:  high risk of alcohol related problems. Score 20 or above:  warrants further diagnostic evaluation for alcohol dependence and treatment.   CLINICAL FACTORS:   Severe Anxiety and/or Agitation Panic Attacks Depression:   Impulsivity Insomnia Previous Psychiatric Diagnoses and  Treatments   Musculoskeletal: Strength & Muscle Tone: within normal limits Gait & Station: normal Patient leans: N/A  Psychiatric Specialty Exam: Physical Exam  Nursing note and vitals reviewed.   Review of Systems  Psychiatric/Behavioral: Positive for depression. The patient is nervous/anxious and has insomnia.   All other systems reviewed and are negative.   Blood pressure 124/84, pulse 75, temperature 98.9 F (37.2 C), temperature source Oral, resp. rate 18, height 5\' 7"  (1.702 m), weight 83.008 kg (183 lb), SpO2 100 %.Body mass index is 28.66 kg/(m^2).  General Appearance: Casual  Eye Contact:  Good  Speech:  Clear and Coherent  Volume:  Normal  Mood:  Depressed, Hopeless and Worthless  Affect:  Blunt  Thought Process:  Goal Directed  Orientation:  Full (Time, Place, and Person)  Thought Content:  WDL  Suicidal Thoughts:  Yes.  with intent/plan  Homicidal Thoughts:  No  Memory:  Immediate;   Fair Recent;   Fair Remote;   Fair  Judgement:  Poor  Insight:  Lacking  Psychomotor Activity:  Normal  Concentration:  Concentration: Fair and Attention Span: Fair  Recall:  [employer] of Knowledge:  Fair  Language:  Fair  Akathisia:  No  Handed:  Right  AIMS (if indicated):     Assets:  Communication Skills Desire for Improvement Housing Physical Health Resilience Social Support  ADL's:  Intact  Cognition:  WNL  Sleep:  Number of Hours: 7      COGNITIVE FEATURES THAT CONTRIBUTE TO RISK:  None    SUICIDE RISK:   Moderate:  Frequent suicidal ideation with limited intensity, and duration, some specificity in terms of plans, no associated intent, good self-control, limited dysphoria/symptomatology, some risk factors present, and identifiable protective factors, including available and accessible social support.  PLAN OF CARE: Hospital admission, medication management, discharge planning.  [name] is a 50 year old female with history of depression, mood  instability, anxiety, suicide attempts admitted after a Xanax overdose in the context of severe social stressors.  1. Suicidal ideation. The patient is able to contract for safety in the hospital.  2. Mood. We'll start Trileptal for mood stabilization and Luvox for depression and anxiety.  3. Hypertension. She is on lisinopril.  4. GERD. She is on Protonix.  5. Xanax use. The patient denies substance abuse problems. Xanax was recently prescribed by her primary care provider.  6. Insomnia. Trazodone is available  7. Migraines. We continue Topamax for migraine prevention.  8. Skin rash and itching. Will prescribe Vistaril.  9. Disposition. She will be discharged to home. She will follow up with [facility] in [place].  I certify that inpatient services furnished can reasonably be expected to improve the patient's condition.   Orson Slick, MD 08/31/2015, 9:56 AM

## 2015-08-31 NOTE — BHH Group Notes (Signed)
[facility] LCSW Group Therapy   08/31/2015 9:30 am  Type of Therapy: Group Therapy   Participation Level: Active   Participation Quality: Attentive, Sharing and Supportive   Affect: Appropriate  Cognitive: Alert and Oriented   Insight: Developing/Improving and Engaged   Engagement in Therapy: Developing/Improving and Engaged   Modes of Intervention: Clarification, Confrontation, Discussion, Education, Exploration,  Limit-setting, Orientation, Problem-solving, Rapport Building, Art therapist, Socialization and Support  Summary of Progress/Problems: The topic for group therapy was feelings about diagnosis. Pt actively participated in group discussion on their past and current diagnosis and how they feel towards this. Pt also identified how society and family members judge them, based on their diagnosis as well as stereotypes and stigmas. Patient expressed concern with constant SI and believes that creating a strong relationship with family could help. Pt identifies her intimate relationships as stressors to her depression and SI/SA. Pt has good insight on diagnosis and believes treatment with a therapist and a psychiatrist would be beneficial.    Emilie Rutter, LCSWA

## 2015-08-31 NOTE — Plan of Care (Signed)
Problem: Safety: Goal: Ability to disclose and discuss suicidal ideas will improve Outcome: Progressing Patient endorsed passive SI but contracted

## 2015-08-31 NOTE — Progress Notes (Signed)
Recreation Therapy Notes  INPATIENT RECREATION THERAPY ASSESSMENT  Patient Details Name: Kayliegh Jerzak MRN: [id number] DOB: [birth year] Today's Date: 08/31/2015  Patient Stressors: Family, Relationship, Work, Other (Comment) Doesn't always get along with children; recent break-up; lost job and trying to find a new one; finances; worried she will lose her home.  Coping Skills:   Isolate, Arguments, Substance Abuse, Avoidance, Exercise, Talking, Music, Sports  Personal Challenges: Anger, Communication, Concentration, Decision-Making, Expressing Yourself, Problem-Solving, Relationships, Self-Esteem/Confidence, Social Interaction, Stress Management, Time Management, Trusting Others  Leisure Interests (2+):  Individual - Other (Comment) (Hang out with friends, spend time with grandchildren)  Awareness of Community Resources:  No  Community Resources:     Current Use:    If no, Barriers?:    Patient Strengths:  Caring, compassionate  Patient Identified Areas of Improvement:  Trusting others and not being so emotional and crying over everything  Current Recreation Participation:  Nothing  Patient Goal for Hospitalization:  To learn how to cope with the trust issues, emotional issues, and rejection issues  [place] of Residence:  [place] of Residence:  [place]   Current [place] (including self-harm):  No  Current HI:  No  Consent to Intern Participation: N/A   Leonette Monarch, LRT/CTRS 08/31/2015, 4:39 PM

## 2015-08-31 NOTE — BHH Group Notes (Signed)
[facility] Group Notes:  (Nursing/MHT/Case Management/Adjunct)  Date:  08/31/2015  Time:  9:33 PM  Type of Therapy:  Group Therapy  Participation Level:  Active  Participation Quality:  Appropriate  Affect:  Appropriate  Cognitive:  Appropriate  Insight:  Appropriate  Engagement in Group:  Engaged  Modes of Intervention:  Discussion  Summary of Progress/Problems:  Kandis Fantasia 08/31/2015, 9:33 PM

## 2015-08-31 NOTE — BHH Counselor (Signed)
Adult Comprehensive Assessment  Patient ID: [name], female   DOB: 02-07-1966, 50 y.o.   MRN: [id number]  Information Source: Information source: Patient  Current Stressors:  Employment / Job issues: unemployed, quit her job in May after disagreement with new supervisor-has had several interviews and has been unable to secure work Museum/gallery curator / Lack of resources (include bankruptcy): Bills piling up, feeling overwhelmed that she may loose her house Social relationships: Recent break up with boyfriend on Friday.  Expresses she feels she is really bothered by feeling rejected and abandoned and requires much reassurance and that this contributed to him wanting to end relationship. Bereavement / Loss: relationship, job  Living/Environment/Situation:  Living Arrangements: Alone Living conditions (as described by patient or guardian): good, [place].   Boyfriend moved out recently What is atmosphere in current home: Comfortable, Supportive  Family History:  Marital status: Single Are you sexually active?: No What is your sexual orientation?: heterosexual Does patient have children?: Yes How many children?: 2 How is patient's relationship with their children?: adult son and daughter  Childhood History:  By whom was/is the patient raised?: Both parents  Education:  Highest grade of school patient has completed: Associate's degree, has CNA, CMA, and Clinical cytogeneticist Currently a student?: No Learning disability?: No  Employment/Work Situation:   Employment situation: Unemployed Patient's job has been impacted by current illness: Yes Describe how patient's job has been impacted: absent too often at times when depressed What is the longest time patient has a held a job?: worked as Quarry manager, Web designer since 2003 Where was the patient employed at that time?: [employer] Has patient ever been in the [employer]?: No Did You Receive Any Psychiatric Treatment/Services  While in the [employer]?: No Are There Guns or Other Weapons in [place]?: No Are These [place]?: No  Financial Resources:   Museum/gallery curator resources: Support from parents / caregiver Does patient have a Programmer, applications or guardian?: No  Alcohol/Substance Abuse:   What has been your use of drugs/alcohol within the last 12 months?: Decribes casual ETOH Use, but says it increased more over the past 2 weeks If attempted suicide, did drugs/alcohol play a role in this?: Yes (overdose on Xanax, but was intentional, not using to get high.) Alcohol/Substance Abuse Treatment Hx: Denies past history Has alcohol/substance abuse ever caused legal problems?: No  Social Support System:   Pensions consultant Support System: Fair Astronomer System: family friends  Leisure/Recreation:   Leisure and Hobbies: travel, swim, spending time with grandkids.  Strengths/Needs:   What things does the patient do well?: take care of others  In what areas does patient struggle / problems for patient: being "ok on my own"    Discharge Plan:   Does patient have access to transportation?: Yes Will patient be returning to same living situation after discharge?: Yes Currently receiving community mental health services: No If no, would patient like referral for services when discharged?: Yes (What county?) ([place] [facility] in [place]) Does patient have financial barriers related to discharge medications?: Yes Patient description of barriers related to discharge medications: unemployed, uninsured  Summary/Recommendations:   Architectural technologist and Recommendations (to be completed by the evaluator): Pt is 50 yo female who was admited after overdosing on 8 Ativan she was prescribed by her primary doctor to help her cope with current stressors.  While on the Behavioral Medicine unit PT will have the opportunity to participate in groups and therapeutic milieu.  She will have medications managed  and assistance with appropriate discharge planning.  Recommendations at tdischarge include outpatient medication management and therapy at [facility] in [facility] in [place].  Pt could especially benefit from DBT.  She signed releases to speak with her daughter and Kyra Searles in Families  Dossie Arbour P.MSW, LCSW 08/31/2015

## 2015-08-31 NOTE — Plan of Care (Signed)
Problem: Medication: Goal: Compliance with prescribed medication regimen will improve Outcome: Progressing Patient medication compliant   

## 2015-08-31 NOTE — Progress Notes (Signed)
Recreation Therapy Notes  Date: 06.27.17 Time: 3:00 pm Location: Craft Room  Group Topic: Self-expression  Goal Area(s) Addresses:  Patient will be able to identify a color that represents each emotion. Patient will verbalize benefit of using art as a means of self-expression. Patient will verbalize one emotion experienced while participating in activity.  Behavioral Response: Attentive, Interactive  Intervention: The Colors Within Me  Activity: Patients were given a blank face worksheet and instructed to pick a color for each emotion they were experiencing and show how much of that emotion they were experiencing on the worksheet.  Education: LRT educated patients on other forms of self-expression  Education Outcome: Acknowledges education/In group clarification offered  Clinical Observations/Feedback: Patient completed activity by picking a color for each emotion and showing how much of that emotion she was feeling. Patient contributed to group discussion by stating what emotions she was feeling, that it was helpful to see her emotions on paper, and what emotions she felt while she was doing the activity.  Leonette Monarch, LRT/CTRS 08/31/2015 4:26 PM

## 2015-08-31 NOTE — Tx Team (Signed)
Interdisciplinary Treatment Plan Update (Adult)  Date:  08/31/2015 Time Reviewed:  3:34 PM  Progress in Treatment: Attending groups: Yes. Participating in groups:  Yes. Taking medication as prescribed:  Yes. Tolerating medication:  Yes. Family/Significant othe contact made:  No, will contact:    Patient understands diagnosis:  Yes. Discussing patient identified problems/goals with staff:  Yes. Medical problems stabilized or resolved:  Yes. Denies suicidal/homicidal ideation: Yes. Issues/concerns per patient self-inventory:  No. Other:  New problem(s) identified: No, Describe:     Discharge Plan or Barriers: Discharge back home and follow up with outpatient provider for medication management   Reason for Continuation of Hospitalization: Depression Medication stabilization Suicidal ideation  Comments:The patient has a long history of depression, anxiety and mood instability with multiple hospital admissions. She came to [facility] emergency 8 pills 0.5 mg Xanax. She recently lost her job and is worried about losing her house. She became increasingly depressed with poor sleep, decreased appetite, anhedonia, feeling of guilt and hopelessness worthlessness, poor energy and concentration, social isolation and crying spells. She complains of heightened anxiety and went to see her primary doctor prescribes the Xanax. Over the past weekend she had serious discussions with her boyfriend of 6 months. It seemed that the relationship is over. The patient sent messages to her children and friends and overdose on Xanax. When she woke up she decided to drive herself to the hospital for help. She denies psychotic symptoms. She reports symptoms suggestive of bipolar mania the past with insomnia, hyperactivity, out of character behavior, racing thoughts, pressured and loud speech, impulsive behavior. She reports panic attacks but no other forms of anxiety except for excessive worries.  She denies problems with alcohol or illicit substances.  Estimated length of stay: 2-3 days  New goal(s):  Review of initial/current patient goals per problem list:   1.  Goal(s): Patient will participate in aftercare plan * Met: No * Target date: at discharge * As evidenced by: Patient will participate within aftercare plan AEB aftercare provider and housing plan at discharge being identified.   2.  Goal (s): Patient will exhibit decreased depressive symptoms and suicidal ideations. * Met: No *  Target date: at discharge * As evidenced by: Patient will utilize self-rating of depression at 3 or below and demonstrate decreased signs of depression or be deemed stable for discharge by MD.   3.  Goal(s): Patient will demonstrate decreased signs and symptoms of anxiety. * Met: No * Target date: at discharge * As evidenced by: Patient will utilize self-rating of anxiety at 3 or below and demonstrated decreased signs of anxiety, or be deemed stable for discharge by MD  Attendees: Patient:  [name] 6/27/20173:34 PM  Family:   6/27/20173:34 PM  Physician:  Orson Slick 6/27/20173:34 PM  Nursing:   Polly Cobia, RN 6/27/20173:34 PM  Case Manager:   6/27/20173:34 PM  Counselor:  Dossie Arbour, LCSW 6/27/20173:34 PM  Other:  Everitt Amber, [place] 6/27/20173:34 PM  Other:   6/27/20173:34 PM  Other:   6/27/20173:34 PM  Other:  6/27/20173:34 PM  Other:  6/27/20173:34 PM  Other:  6/27/20173:34 PM  Other:  6/27/20173:34 PM  Other:  6/27/20173:34 PM  Other:  6/27/20173:34 PM  Other:   6/27/20173:34 PM   Scribe for Treatment Team:   Dossie Arbour P,MSW, LCSW 08/31/2015, 3:34 PM

## 2015-08-31 NOTE — Progress Notes (Signed)
D: Patient appears very anxious. Starting asking about different medications and what would work for crying spells and being anxious. This writer told her to direct her questions to the MD that would be her attending. She endorses passive SI but contracts. Denies HI and AVH. Rates her HA at an 8. Also complaining of generalized rash with itching.  A: Medication was given with education. Encouragement was provided. MD was notified of rash. PRN visteril was ordered.  R: Patient was compliant with medication. She has remained calm and cooperative. Safety maintained with 15 min checks.

## 2015-08-31 NOTE — H&P (Signed)
Psychiatric Admission Assessment Adult  Patient Identification: [name] MRN:  [id number] Date of Evaluation:  08/31/2015 Chief Complaint:  major depressive disorder Principal Diagnosis: Major depressive disorder, recurrent severe without psychotic features ([place]) Diagnosis:   Patient Active Problem List   Diagnosis Date Noted  . Major depressive disorder, recurrent severe without psychotic features ([place]) [F33.2] 08/30/2015  . Suicide attempt by drug ingestion ([place]) [T50.902A] 08/30/2015  . Suicidal ideation [R45.851] 08/30/2015  . Migraines [G43.909] 09/29/2014  . Headache(784.0) [R51] 05/19/2013  . Left-sided weakness [M62.89] 05/18/2013  . Numbness and tingling of left arm and leg [R20.2] 05/18/2013  . CVA (cerebral infarction) [I63.9] 05/18/2013  . Hypertension [I10]   . Anxiety [F41.9]    History of Present Illness:    Identifying data. [name] is a 50 year old female with a history of depression, mood instability, anxiety and suicide attempts.  Chief complaint. "I am under a lot of stress."  History of present illness. Information was obtained from the patient and the chart. The patient has a long history of depression, anxiety and mood instability with multiple hospital admissions. She came to [facility] emergency 8 pills 0.5 mg Xanax. She recently lost her job and is worried about losing her house. She became increasingly depressed with poor sleep, decreased appetite, anhedonia, feeling of guilt and hopelessness worthlessness, poor energy and concentration, social isolation and crying spells. She complains of heightened anxiety and went to see her primary doctor prescribes the Xanax. Over the past weekend she had serious discussions with her boyfriend of 6 months. It seemed that the relationship is over. The patient sent messages to her children and friends and overdose on Xanax. When she woke up she decided to drive herself to the hospital for help. She  denies psychotic symptoms. She reports symptoms suggestive of bipolar mania the past with insomnia, hyperactivity, out of character behavior, racing thoughts, pressured and loud speech, impulsive behavior. She reports panic attacks but no other forms of anxiety except for excessive worries. She denies problems with alcohol or illicit substances.  Past psychiatric history. She was hospitalized 3 times in the past, twice for suicide attempt by overdose. This was always related to relationship problems including divorces. She was tried on Effexor, Zoloft, trazodone, Vistaril and now Xanax with no improvement.  Family psychiatric history. There is a history of completed suicide by the gun on her mother's family.  Social history. She was married 3 times. She is divorced now. She has 2 adult children and grandchildren. She used to work as a Quarry manager. She has no health insurance.  Total Time spent with patient: 1 hour  Is the patient at risk to self? Yes.    Has the patient been a risk to self in the past 6 months? Yes.    Has the patient been a risk to self within the distant past? Yes.    Is the patient a risk to others? No.  Has the patient been a risk to others in the past 6 months? No.  Has the patient been a risk to others within the distant past? No.   Prior Inpatient Therapy:   Prior Outpatient Therapy:    Alcohol Screening: 1. How often do you have a drink containing alcohol?: Monthly or less 2. How many drinks containing alcohol do you have on a typical day when you are drinking?: 1 or 2 3. How often do you have six or more drinks on one occasion?: Never Preliminary Score: 0 9. Have you  or someone else been injured as a result of your drinking?: No 10. Has a relative or friend or a doctor or another health worker been concerned about your drinking or suggested you cut down?: No Alcohol Use Disorder Identification Test Final Score (AUDIT): 1 Brief Intervention: AUDIT score less than 7 or  less-screening does not suggest unhealthy drinking-brief intervention not indicated Substance Abuse History in the last 12 months:  No. Consequences of Substance Abuse: NA Previous Psychotropic Medications: Yes  Psychological Evaluations: No  Past Medical History:  Past Medical History  Diagnosis Date  . Depression   . Hypertension   . Anxiety   . Rosacea     Past Surgical History  Procedure Laterality Date  . Appendectomy    . Cholecystectomy    . Tonsillectomy    . Lumbar      ruptured disc in lumbar taken out  . Back surgery    . Appendectomy    . Cholecystectomy    . Abdominal hysterectomy      non cancerous  . Rotator cuff repair     Family History:  Family History  Problem Relation Age of Onset  . Diabetes Father   . Heart disease Father 53  . Hypertension Father   . Cancer Maternal Grandfather     colon cancer (70's)  . Diabetes Paternal Grandmother   . Diabetes Paternal Grandfather   . Cancer Cousin     breast    Tobacco Screening: '@FLOW' [phone]::1)@ Social History:  History  Alcohol Use  . 0.0 oz/week  . 0 Standard drinks or equivalent per week    Comment: occassionally     History  Drug Use No    Additional Social History:                           Allergies:   Allergies  Allergen Reactions  . Penicillins Hives    Has patient had a PCN reaction causing immediate rash, facial/tongue/throat swelling, SOB or lightheadedness with hypotension: Yes Has patient had a PCN reaction causing severe rash involving mucus membranes or skin necrosis: No Has patient had a PCN reaction that required hospitalization No Has patient had a PCN reaction occurring within the last 10 years: No If all of the above answers are "NO", then may proceed with Cephalosporin use.     Lab Results:  Results for orders placed or performed during the hospital encounter of 08/29/15 (from the past 48 hour(s))  CBC with Differential/Platelet     Status: None    Collection Time: 08/29/15  6:06 PM  Result Value Ref Range   WBC 7.7 4.0 - 10.5 K/uL   RBC 4.55 3.87 - 5.11 MIL/uL   Hemoglobin 13.4 12.0 - 15.0 g/dL   HCT 40.2 36.0 - 46.0 %   MCV 88.4 78.0 - 100.0 fL   MCH 29.5 26.0 - 34.0 pg   MCHC 33.3 30.0 - 36.0 g/dL   RDW 13.5 11.5 - 15.5 %   Platelets 262 150 - 400 K/uL   Neutrophils Relative % 66 %   Neutro Abs 5.0 1.7 - 7.7 K/uL   Lymphocytes Relative 26 %   Lymphs Abs 2.0 0.7 - 4.0 K/uL   Monocytes Relative 6 %   Monocytes Absolute 0.5 0.1 - 1.0 K/uL   Eosinophils Relative 2 %   Eosinophils Absolute 0.1 0.0 - 0.7 K/uL   Basophils Relative 0 %   Basophils Absolute 0.0 0.0 - 0.1 K/uL  Comprehensive metabolic panel     Status: Abnormal   Collection Time: 08/29/15  6:06 PM  Result Value Ref Range   Sodium 140 135 - 145 mmol/L   Potassium 4.0 3.5 - 5.1 mmol/L   Chloride 107 101 - 111 mmol/L   CO2 27 22 - 32 mmol/L   Glucose, Bld 115 (H) 65 - 99 mg/dL   BUN 7 6 - 20 mg/dL   Creatinine, Ser 0.59 0.44 - 1.00 mg/dL   Calcium 8.8 (L) 8.9 - 10.3 mg/dL   Total Protein 7.0 6.5 - 8.1 g/dL   Albumin 3.9 3.5 - 5.0 g/dL   AST 40 15 - 41 U/L   ALT 56 (H) 14 - 54 U/L   Alkaline Phosphatase 115 38 - 126 U/L   Total Bilirubin 0.7 0.3 - 1.2 mg/dL   GFR calc non Af Amer >60 >60 mL/min   GFR calc Af Amer >60 >60 mL/min    Comment: (NOTE) The eGFR has been calculated using the CKD EPI equation. This calculation has not been validated in all clinical situations. eGFR's persistently <60 mL/min signify possible Chronic Kidney Disease.    Anion gap 6 5 - 15  Ethanol     Status: None   Collection Time: 08/29/15  6:06 PM  Result Value Ref Range   Alcohol, Ethyl (B) <5 <5 mg/dL    Comment:        LOWEST DETECTABLE LIMIT FOR SERUM ALCOHOL IS 5 mg/dL FOR MEDICAL PURPOSES ONLY   Acetaminophen level     Status: Abnormal   Collection Time: 08/29/15  6:06 PM  Result Value Ref Range   Acetaminophen (Tylenol), Serum <10 (L) 10 - 30 ug/mL    Comment:         THERAPEUTIC CONCENTRATIONS VARY SIGNIFICANTLY. A RANGE OF 10-30 ug/mL MAY BE AN EFFECTIVE CONCENTRATION FOR MANY PATIENTS. HOWEVER, SOME ARE BEST TREATED AT CONCENTRATIONS OUTSIDE THIS RANGE. ACETAMINOPHEN CONCENTRATIONS >150 ug/mL AT 4 HOURS AFTER INGESTION AND >50 ug/mL AT 12 HOURS AFTER INGESTION ARE OFTEN ASSOCIATED WITH TOXIC REACTIONS.   Salicylate level     Status: None   Collection Time: 08/29/15  6:06 PM  Result Value Ref Range   Salicylate Lvl <1.0 2.8 - 30.0 mg/dL  Urine rapid drug screen (hosp performed)     Status: Abnormal   Collection Time: 08/29/15  6:39 PM  Result Value Ref Range   Opiates NONE DETECTED NONE DETECTED   Cocaine NONE DETECTED NONE DETECTED   Benzodiazepines POSITIVE (A) NONE DETECTED   Amphetamines NONE DETECTED NONE DETECTED   Tetrahydrocannabinol NONE DETECTED NONE DETECTED   Barbiturates NONE DETECTED NONE DETECTED    Comment:        DRUG SCREEN FOR MEDICAL PURPOSES ONLY.  IF CONFIRMATION IS NEEDED FOR ANY PURPOSE, NOTIFY LAB WITHIN 5 DAYS.        LOWEST DETECTABLE LIMITS FOR URINE DRUG SCREEN Drug Class       Cutoff (ng/mL) Amphetamine      1000 Barbiturate      200 Benzodiazepine   932 Tricyclics       355 Opiates          300 Cocaine          300 THC              50     Blood Alcohol level:  Lab Results  Component Value Date   [facility] <5 08/29/2015   ETH <11 73/22/0254    Metabolic Disorder Labs:  Lab Results  Component Value Date   HGBA1C 5.2 05/19/2013   MPG 103 05/19/2013   No results found for: PROLACTIN Lab Results  Component Value Date   CHOL 147 05/19/2013   TRIG 119 05/19/2013   HDL 57 05/19/2013   CHOLHDL 2.6 05/19/2013   VLDL 24 05/19/2013   LDLCALC 66 05/19/2013   LDLCALC 109* 01/03/2013    Current Medications: Current Facility-Administered Medications  Medication Dose Route Frequency Provider Last Rate Last Dose  . acetaminophen (TYLENOL) tablet 650 mg  650 mg Oral Q6H PRN Clovis Fredrickson, MD   650 mg at 08/30/15 2202  . alum & mag hydroxide-simeth (MAALOX/MYLANTA) 200-200-20 MG/5ML suspension 30 mL  30 mL Oral Q4H PRN Lorelee Mclaurin B Kalman Nylen, MD      . amLODipine (NORVASC) tablet 5 mg  5 mg Oral Daily Gentry Pilson B Cheral Cappucci, MD   5 mg at 08/31/15 0914  . fluvoxaMINE (LUVOX) tablet 50 mg  50 mg Oral QHS Gloriann Riede B Leatrice Parilla, MD      . hydrOXYzine (ATARAX/VISTARIL) tablet 25 mg  25 mg Oral Q4H PRN Alexander Aument B Osa Campoli, MD      . hydrOXYzine (ATARAX/VISTARIL) tablet 50 mg  50 mg Oral PRN Hildred Priest, MD   50 mg at 08/30/15 2339  . magnesium hydroxide (MILK OF MAGNESIA) suspension 30 mL  30 mL Oral Daily PRN Jerelene Salaam B Ireland Chagnon, MD      . OXcarbazepine (TRILEPTAL) 300 MG/5ML suspension 150 mg  150 mg Oral BID Chattie Greeson B Deon Ivey, MD      . pantoprazole (PROTONIX) EC tablet 40 mg  40 mg Oral Daily Ciarrah Rae B Albirta Rhinehart, MD   40 mg at 08/31/15 0915  . topiramate (TOPAMAX) tablet 100 mg  100 mg Oral QHS Clovis Fredrickson, MD   100 mg at 08/30/15 2159  . traZODone (DESYREL) tablet 100 mg  100 mg Oral QHS Clovis Fredrickson, MD   100 mg at 08/30/15 2159   PTA Medications: Prescriptions prior to admission  Medication Sig Dispense Refill Last Dose  . amLODipine (NORVASC) 5 MG tablet Take 1 tablet (5 mg total) by mouth daily. 30 tablet 6 08/29/2015 at Unknown time  . omeprazole (PRILOSEC) 20 MG capsule Take 20 mg by mouth 2 (two) times daily as needed (for acid reflux/GERD).    Past Month at Unknown time  . topiramate (TOPAMAX) 25 MG tablet TAKE 1 TO 2 TABLETS BY MOUTH AT BEDTIME 60 tablet 0 08/29/2015 at Unknown time    Musculoskeletal: Strength & Muscle Tone: within normal limits Gait & Station: normal Patient leans: N/A  Psychiatric Specialty Exam: Physical Exam  Nursing note and vitals reviewed. Constitutional: She is oriented to person, place, and time. She appears well-developed and well-nourished.  HENT:  Head: Normocephalic and atraumatic.  Eyes:  Conjunctivae and EOM are normal. Pupils are equal, round, and reactive to light.  Neck: Normal range of motion. Neck supple.  Cardiovascular: Normal rate, regular rhythm and normal heart sounds.   Respiratory: Effort normal and breath sounds normal.  GI: Soft. Bowel sounds are normal.  Musculoskeletal: Normal range of motion.  Neurological: She is alert and oriented to person, place, and time.  Skin: Skin is warm and dry.    Review of Systems  Psychiatric/Behavioral: Positive for depression. The patient is nervous/anxious and has insomnia.   All other systems reviewed and are negative.   Blood pressure 124/84, pulse 75, temperature 98.9 F (37.2 C), temperature source Oral, resp. rate 18, height '5\' 7"'  (1.702  m), weight 83.008 kg (183 lb), SpO2 100 %.Body mass index is 28.66 kg/(m^2).  See SRA.                                                  Sleep:  Number of Hours: 7       Treatment Plan Summary: Daily contact with patient to assess and evaluate symptoms and progress in treatment and Medication management   [name] is a 50 year old female with history of depression, mood instability, anxiety, suicide attempts admitted after a Xanax overdose in the context of severe social stressors.  1. Suicidal ideation. The patient is able to contract for safety in the hospital.  2. Mood. We'll start Trileptal for mood stabilization and Luvox for depression and anxiety.  3. Hypertension. She is on lisinopril.  4. GERD. She is on Protonix.  5. Xanax use. The patient denies substance abuse problems. Xanax was recently prescribed by her primary care provider.  6. Insomnia. Trazodone is available  7. Migraines. We continue Topamax for migraine prevention.  8. Skin rash and itching. Will prescribe Vistaril.  9. Disposition. She will be discharged to home. She will follow up with [facility] in [place].  I certify that inpatient services furnished can reasonably be  expected to improve the patient's condition.    Observation Level/Precautions:  15 minute checks  Laboratory:  CBC Chemistry Profile UDS UA  Psychotherapy:    Medications:    Consultations:    Discharge Concerns:    Estimated LOS:  Other:     I certify that inpatient services furnished can reasonably be expected to improve the patient's condition.    Orson Slick, MD 6/27/201710:02 AM

## 2015-08-31 NOTE — [facility] Group Notes (Signed)
[facility] Group Notes:  (Nursing/MHT/Case Management/Adjunct)  Date:  08/31/2015  Time:  2:13 PM  Type of Therapy:  Psychoeducational Skills  Participation Level:  Active  Participation Quality:  Appropriate, Attentive and Supportive  Affect:  Appropriate  Cognitive:  Appropriate  Insight:  Appropriate  Engagement in Group:  Engaged and Supportive  Modes of Intervention:  Discussion, Education and Support  Summary of Progress/Problems:  [name] 08/31/2015, 2:13 PM

## 2015-09-01 MED ORDER — FLUVOXAMINE MALEATE 50 MG PO TABS
100.0000 mg | ORAL_TABLET | Freq: Every day | ORAL | Status: DC
  Administered 2015-09-01 – 2015-09-02 (×2): 100 mg via ORAL
  Filled 2015-09-01 (×2): qty 2

## 2015-09-01 MED ORDER — OXCARBAZEPINE 150 MG PO TABS
150.0000 mg | ORAL_TABLET | Freq: Two times a day (BID) | ORAL | Status: DC
  Administered 2015-09-01 – 2015-09-03 (×4): 150 mg via ORAL
  Filled 2015-09-01 (×4): qty 1

## 2015-09-01 MED ORDER — HYDROXYZINE HCL 50 MG PO TABS
50.0000 mg | ORAL_TABLET | ORAL | Status: DC | PRN
  Administered 2015-09-01 – 2015-09-02 (×3): 50 mg via ORAL
  Filled 2015-09-01 (×4): qty 1

## 2015-09-01 NOTE — Plan of Care (Signed)
Problem: Coping: Goal: Ability to cope will improve Outcome: Progressing Developing coping skills

## 2015-09-01 NOTE — [facility] Group Notes (Signed)
[place] Group Notes:  (Nursing/MHT/Case Management/Adjunct)  Date:  09/01/2015  Time:  4:15 PM  Type of Therapy:  Psychoeducational Skills  Participation Level:  Active  Participation Quality:  Appropriate, Attentive and Sharing  Affect:  Appropriate  Cognitive:  Alert  Insight:  Appropriate  Engagement in Group:  Engaged  Modes of Intervention:  Discussion, Education and Support  Summary of Progress/Problems:  Adela Lank [facility] 09/01/2015, 4:15 PM

## 2015-09-01 NOTE — [facility] Group Notes (Signed)
[facility] LCSW Group Therapy   09/01/2015  9:30am   Type of Therapy: Group Therapy   Participation Level: Active   Participation Quality: Attentive, Sharing and Supportive   Affect: Appropriate   Cognitive: Alert and Oriented   Insight: Developing/Improving and Engaged   Engagement in Therapy: Developing/Improving and Engaged   Modes of Intervention: Clarification, Confrontation, Discussion, Education, Exploration, Limit-setting, Orientation, Problem-solving, Rapport Building, Art therapist, Socialization and Support   Summary of Progress/Problems: The topic for group today was emotional regulation. This group focused on both positive and negative emotion identification and allowed  group members to process ways to identify feelings, regulate negative emotions, and find healthy ways to manage internal/external emotions. Group members were asked to reflect on a time when their reaction to an emotion led to a negative outcome and explored how alternative responses using emotion regulation would have benefited them. Group members were also asked to discuss a time when emotion regulation was utilized when a negative emotion was experienced. Pt has good insight on emotion regulation but believes she has complications with turrning negative reactions to positives. She stated positive reactions that she looks forward to trying at d/c.     Emilie Rutter, MSW, Latanya Presser

## 2015-09-01 NOTE — Plan of Care (Signed)
Problem: [facility] Participation in Recreation Therapeutic Interventions Goal: STG-Patient will demonstrate improved self esteem by identif STG: Self-Esteem - Within 4 treatment sessions, patient will verbalize at least 5 positive affirmation statements in each of 2 treatment sessions to increase self-esteem post d/c.  Outcome: Progressing Treatment Session 1; Completed 1 out of 2: At approximately 1:50 pm, LRT met with patient in patient room. Patient verbalized 5 positive affirmation statements. Patient reported it felt "good". LRT encouraged patient to continue saying positive affirmation statements.  Leonette Monarch, LRT/CTRS 06.28.17 4:12 pm Goal: STG-Other Recreation Therapy Goal (Specify) STG: Stress Management - Within 4 treatment sessions, patient will verbalize understanding of the stress management techniques in each of 2 treatment sessions to increase stress management skills post d/c.  Outcome: Progressing Treatment Session 1; Completed 1 out of 2: At approximately 1:50 pm, LRT met with patient in patient room. LRT educated and provided patient with handouts on stress management techniques. Patient verbalized understanding. LRT encouraged patient to read over and practice the stress management techniques.  Leonette Monarch, LRT/CTRS 06.28.17 4:13 pm

## 2015-09-01 NOTE — Progress Notes (Signed)
Recreation Therapy Notes  Date: 06.28.17 Time: 1:00 pm Location: Craft Room  Group Topic: Self-esteem  Goal Area(s) Addresses:  Patient will identify positive traits about self. Patient will verbalize benefit of having a healthy self-esteem.  Behavioral Response: Attentive, Interactive  Intervention: I Am  Activity: Patients were given a worksheet with the letter I on it and instructed to write as many positive traits inside the letter.  Education: LRT educated patients on ways they can increase their self-esteem.  Education Outcome: Acknowledges education/In group clarification offered  Clinical Observations/Feedback: Patient completed activity by writing positive traits. Patient contributed to group discussion by stating it was easy at first then it got difficult to think of positive traits, how it felt to list positive traits, how her self-esteem affects her, what affects her self-esteem, and how she can increase her self-esteem.  Leonette Monarch, LRT/CTRS 09/01/2015 4:04 PM

## 2015-09-01 NOTE — Progress Notes (Signed)
D: Pt denies SI/HI/AVH. Pt is pleasant and cooperative, affect is flat and sad but brightens upon approach.. Pt appears less anxious and she is interacting with peers and staff appropriately.  A: Pt was offered support and encouragement. Pt was given scheduled medications. Pt was encouraged to attend groups. Q 15 minute checks were done for safety.  R:Pt attends groups and interacts well with peers and staff. Pt is taking medication. Pt has no complaints.Pt receptive to treatment and safety maintained on unit.

## 2015-09-01 NOTE — Progress Notes (Signed)
[facility] MD Progress Note  09/01/2015 2:04 PM [name]  MRN:  [id number]  Subjective:  [name] still feels depressed, suicidal, hopeless, and drained of energy. She slept better last night but does not feel rested in the morning. She still complains of severe anxiety. She complains of skin rash that is itchy on her abdomen and chest and hence. She has had it for a while. She believes that this is stress related. She denies it IN any relation to medications. Good group participation.  Principal Problem: Major depressive disorder, recurrent severe without psychotic features ([place]) Diagnosis:   Patient Active Problem List   Diagnosis Date Noted  . Major depressive disorder, recurrent severe without psychotic features ([place]) [F33.2] 08/30/2015  . Suicide attempt by drug ingestion ([place]) [T50.902A] 08/30/2015  . Suicidal ideation [R45.851] 08/30/2015  . Migraines [G43.909] 09/29/2014  . Headache(784.0) [R51] 05/19/2013  . Left-sided weakness [M62.89] 05/18/2013  . Numbness and tingling of left arm and leg [R20.2] 05/18/2013  . CVA (cerebral infarction) [I63.9] 05/18/2013  . Hypertension [I10]   . Anxiety [F41.9]    Total Time spent with patient: 20 minutes  Past Psychiatric History: Depression and anxiety.  Past Medical History:  Past Medical History  Diagnosis Date  . Depression   . Hypertension   . Anxiety   . Rosacea     Past Surgical History  Procedure Laterality Date  . Appendectomy    . Cholecystectomy    . Tonsillectomy    . Lumbar      ruptured disc in lumbar taken out  . Back surgery    . Appendectomy    . Cholecystectomy    . Abdominal hysterectomy      non cancerous  . Rotator cuff repair     Family History:  Family History  Problem Relation Age of Onset  . Diabetes Father   . Heart disease Father 88  . Hypertension Father   . Cancer Maternal Grandfather     colon cancer (70's)  . Diabetes Paternal Grandmother   . Diabetes Paternal Grandfather   . Cancer  Cousin     breast   Family Psychiatric  History: See H&P. Social History:  History  Alcohol Use  . 0.0 oz/week  . 0 Standard drinks or equivalent per week    Comment: occassionally     History  Drug Use No    Social History   Social History  . Marital Status: Divorced    Spouse Name: N/A  . Number of Children: N/A  . Years of Education: N/A   Social History Main Topics  . Smoking status: Former Research scientist (life sciences)  . Smokeless tobacco: Never Used  . Alcohol Use: 0.0 oz/week    0 Standard drinks or equivalent per week     Comment: occassionally  . Drug Use: No  . Sexual Activity: Yes   Other Topics Concern  . None   Social History Narrative   Additional Social History:                         Sleep: Fair  Appetite:  Fair  Current Medications: Current Facility-Administered Medications  Medication Dose Route Frequency Provider Last Rate Last Dose  . acetaminophen (TYLENOL) tablet 650 mg  650 mg Oral Q6H PRN Clovis Fredrickson, MD   650 mg at 08/31/15 1642  . alum & mag hydroxide-simeth (MAALOX/MYLANTA) 200-200-20 MG/5ML suspension 30 mL  30 mL Oral Q4H PRN Clovis Fredrickson, MD      .  amLODipine (NORVASC) tablet 5 mg  5 mg Oral Daily Clovis Fredrickson, MD   5 mg at 08/31/15 0914  . fluvoxaMINE (LUVOX) tablet 100 mg  100 mg Oral QHS Aldean Pipe B Scarlettrose Costilow, MD      . hydrOXYzine (ATARAX/VISTARIL) tablet 50 mg  50 mg Oral Q4H PRN Chakita Mcgraw B Devanshi Califf, MD      . magnesium hydroxide (MILK OF MAGNESIA) suspension 30 mL  30 mL Oral Daily PRN Arilynn Blakeney B Keishawna Carranza, MD      . OXcarbazepine (TRILEPTAL) tablet 150 mg  150 mg Oral BID WC Ean Gettel B Vicktoria Muckey, MD      . pantoprazole (PROTONIX) EC tablet 40 mg  40 mg Oral Daily Clovis Fredrickson, MD   40 mg at 09/01/15 0918  . topiramate (TOPAMAX) tablet 100 mg  100 mg Oral QHS Clovis Fredrickson, MD   100 mg at 08/31/15 2203  . traZODone (DESYREL) tablet 100 mg  100 mg Oral QHS Clovis Fredrickson, MD   100 mg at  08/31/15 2203    Lab Results: No results found for this or any previous visit (from the past 48 hour(s)).  Blood Alcohol level:  Lab Results  Component Value Date   [facility] <5 08/29/2015   ETH <11 [id number]    Metabolic Disorder Labs: Lab Results  Component Value Date   HGBA1C 5.2 05/19/2013   MPG 103 05/19/2013   No results found for: PROLACTIN Lab Results  Component Value Date   CHOL 147 05/19/2013   TRIG 119 05/19/2013   HDL 57 05/19/2013   CHOLHDL 2.6 05/19/2013   VLDL 24 05/19/2013   LDLCALC 66 05/19/2013   LDLCALC 109* 01/03/2013    Physical Findings: AIMS: Facial and Oral Movements Muscles of Facial Expression: None, normal Lips and Perioral Area: None, normal Jaw: None, normal Tongue: None, normal,Extremity Movements Upper (arms, wrists, hands, fingers): None, normal Lower (legs, knees, ankles, toes): None, normal, Trunk Movements Neck, shoulders, hips: None, normal, Overall Severity Severity of abnormal movements (highest score from questions above): None, normal Incapacitation due to abnormal movements: None, normal Patient's awareness of abnormal movements (rate only patient's report): No Awareness, Dental Status Current problems with teeth and/or dentures?: No Does patient usually wear dentures?: No  CIWA:  CIWA-Ar Total: 0 COWS:     Musculoskeletal: Strength & Muscle Tone: within normal limits Gait & Station: normal Patient leans: N/A  Psychiatric Specialty Exam: Physical Exam  Nursing note and vitals reviewed.   Review of Systems  Psychiatric/Behavioral: Positive for depression and suicidal ideas. The patient is nervous/anxious.     Blood pressure 94/60, pulse 61, temperature 98.2 F (36.8 C), temperature source Oral, resp. rate 18, height 5\' 7"  (1.702 m), weight 83.008 kg (183 lb), SpO2 100 %.Body mass index is 28.66 kg/(m^2).  General Appearance: Casual  Eye Contact:  Good  Speech:  Clear and Coherent  Volume:  Normal  Mood:  Anxious and  Depressed  Affect:  Blunt  Thought Process:  Goal Directed  Orientation:  Full (Time, Place, and Person)  Thought Content:  WDL  Suicidal Thoughts:  Yes.  with intent/plan  Homicidal Thoughts:  No  Memory:  Immediate;   Fair Recent;   Fair Remote;   Fair  Judgement:  Poor  Insight:  Shallow  Psychomotor Activity:  Normal  Concentration:  Concentration: Fair and Attention Span: Fair  Recall:  [employer] of Knowledge:  Fair  Language:  Fair  Akathisia:  No  Handed:  Right  AIMS (  if indicated):     Assets:  Communication Skills Desire for Improvement Housing Physical Health Resilience Social Support  ADL's:  Intact  Cognition:  WNL  Sleep:  Number of Hours: 5.75     Treatment Plan Summary: Daily contact with patient to assess and evaluate symptoms and progress in treatment and Medication management   [name] is a 50 year old female with history of depression, mood instability, anxiety, suicide attempts admitted after a Xanax overdose in the context of severe social stressors.  1. Suicidal ideation. The patient is able to contract for safety in the hospital.  2. Mood. We started Trileptal for mood stabilization and Luvox for depression and anxiety. Will increase Luvox to 100 mg.  3. Hypertension. She is on lisinopril. It was held this morning due to low blood pressure.  4. GERD. She is on Protonix.  5. Xanax use. The patient denies substance abuse problems. Xanax was recently prescribed by her primary care provider.  6. Insomnia. Trazodone is available  7. Migraines. We continue Topamax for migraine prevention.  8. Skin rash and itching. We increased the Vistaril to 50 mg as needed.   9. Disposition. She will be discharged to home. She will follow up with [facility] in [place].  Orson Slick, MD 09/01/2015, 2:04 PM

## 2015-09-02 MED ORDER — FOSFOMYCIN TROMETHAMINE 3 G PO PACK
3.0000 g | PACK | Freq: Once | ORAL | Status: AC
  Administered 2015-09-02: 3 g via ORAL
  Filled 2015-09-02: qty 3

## 2015-09-02 MED ORDER — FLUVOXAMINE MALEATE 100 MG PO TABS: 100.0000 mg | ORAL_TABLET | Freq: Every day | ORAL | Status: DC

## 2015-09-02 MED ORDER — BISACODYL 5 MG PO TBEC
5.0000 mg | DELAYED_RELEASE_TABLET | Freq: Every day | ORAL | Status: DC | PRN
Start: 2015-09-02 — End: 2015-09-03
  Administered 2015-09-02: 5 mg via ORAL
  Filled 2015-09-02 (×2): qty 1

## 2015-09-02 MED ORDER — TOPIRAMATE 100 MG PO TABS: 200.0000 mg | ORAL_TABLET | Freq: Every day | ORAL | Status: DC

## 2015-09-02 MED ORDER — OXCARBAZEPINE 150 MG PO TABS: 150.0000 mg | ORAL_TABLET | Freq: Two times a day (BID) | ORAL | Status: DC

## 2015-09-02 MED ORDER — MAGNESIUM CITRATE PO SOLN
1.0000 | Freq: Once | ORAL | Status: AC
  Administered 2015-09-02: 1 via ORAL
  Filled 2015-09-02: qty 296

## 2015-09-02 MED ORDER — AMLODIPINE BESYLATE 5 MG PO TABS: 5.0000 mg | ORAL_TABLET | Freq: Every day | ORAL | Status: DC

## 2015-09-02 MED ORDER — HYDROXYZINE HCL 50 MG PO TABS: 50.0000 mg | ORAL_TABLET | ORAL | Status: DC | PRN

## 2015-09-02 MED ORDER — TRAZODONE HCL 100 MG PO TABS: 100.0000 mg | ORAL_TABLET | Freq: Every day | ORAL | Status: DC

## 2015-09-02 MED ORDER — FLEET ENEMA 7-19 GM/118ML RE ENEM
1.0000 | ENEMA | Freq: Once | RECTAL | Status: AC
  Administered 2015-09-02: 1 via RECTAL

## 2015-09-02 MED ORDER — DOCUSATE SODIUM 100 MG PO CAPS
100.0000 mg | ORAL_CAPSULE | Freq: Two times a day (BID) | ORAL | Status: DC
  Administered 2015-09-02 – 2015-09-03 (×3): 100 mg via ORAL
  Filled 2015-09-02 (×3): qty 1

## 2015-09-02 NOTE — Progress Notes (Signed)
[facility] MD Progress Note  09/02/2015 2:53 PM [name]  MRN:  [id number]  Subjective:  [name] still feels very depressed, anxious, and has passing thoughts of suicide. She did receive a good new about prospects of employment. She is slightly more optimistic about the future. She complains of poor energy. Sleep has improved. She reports symptoms of UTI. She reportedly has B12 deficiency. Good group participation.  Principal Problem: Major depressive disorder, recurrent severe without psychotic features ([place]) Diagnosis:   Patient Active Problem List   Diagnosis Date Noted  . Major depressive disorder, recurrent severe without psychotic features ([place]) [F33.2] 08/30/2015  . Suicide attempt by drug ingestion ([place]) [T50.902A] 08/30/2015  . Suicidal ideation [R45.851] 08/30/2015  . Migraines [G43.909] 09/29/2014  . Headache(784.0) [R51] 05/19/2013  . Left-sided weakness [M62.89] 05/18/2013  . Numbness and tingling of left arm and leg [R20.2] 05/18/2013  . CVA (cerebral infarction) [I63.9] 05/18/2013  . Hypertension [I10]   . Anxiety [F41.9]    Total Time spent with patient: 20 minutes  Past Psychiatric History: Depression, anxiety, mood instability, suicidal ideation.  Past Medical History:  Past Medical History  Diagnosis Date  . Depression   . Hypertension   . Anxiety   . Rosacea     Past Surgical History  Procedure Laterality Date  . Appendectomy    . Cholecystectomy    . Tonsillectomy    . Lumbar      ruptured disc in lumbar taken out  . Back surgery    . Appendectomy    . Cholecystectomy    . Abdominal hysterectomy      non cancerous  . Rotator cuff repair     Family History:  Family History  Problem Relation Age of Onset  . Diabetes Father   . Heart disease Father 26  . Hypertension Father   . Cancer Maternal Grandfather     colon cancer (70's)  . Diabetes Paternal Grandmother   . Diabetes Paternal Grandfather   . Cancer Cousin     breast   Family  Psychiatric  History: See H&P. Social History:  History  Alcohol Use  . 0.0 oz/week  . 0 Standard drinks or equivalent per week    Comment: occassionally     History  Drug Use No    Social History   Social History  . Marital Status: Divorced    Spouse Name: N/A  . Number of Children: N/A  . Years of Education: N/A   Social History Main Topics  . Smoking status: Former Research scientist (life sciences)  . Smokeless tobacco: Never Used  . Alcohol Use: 0.0 oz/week    0 Standard drinks or equivalent per week     Comment: occassionally  . Drug Use: No  . Sexual Activity: Yes   Other Topics Concern  . None   Social History Narrative   Additional Social History:                         Sleep: Fair  Appetite:  Fair  Current Medications: Current Facility-Administered Medications  Medication Dose Route Frequency Provider Last Rate Last Dose  . acetaminophen (TYLENOL) tablet 650 mg  650 mg Oral Q6H PRN Clovis Fredrickson, MD   650 mg at 09/01/15 1617  . alum & mag hydroxide-simeth (MAALOX/MYLANTA) 200-200-20 MG/5ML suspension 30 mL  30 mL Oral Q4H PRN Koltan Portocarrero B Damyon Mullane, MD      . amLODipine (NORVASC) tablet 5 mg  5 mg Oral Daily Akasha Melena  Vevelyn Francois, MD   5 mg at 08/31/15 0914  . bisacodyl (DULCOLAX) EC tablet 5 mg  5 mg Oral Daily PRN Clovis Fredrickson, MD   5 mg at 09/02/15 1136  . docusate sodium (COLACE) capsule 100 mg  100 mg Oral BID Violia Knopf B Lynna Zamorano, MD   100 mg at 09/02/15 1134  . fluvoxaMINE (LUVOX) tablet 100 mg  100 mg Oral QHS Kiana Hollar B Nabeel Gladson, MD   100 mg at 09/01/15 2116  . hydrOXYzine (ATARAX/VISTARIL) tablet 50 mg  50 mg Oral Q4H PRN Clovis Fredrickson, MD   50 mg at 09/01/15 2118  . magnesium hydroxide (MILK OF MAGNESIA) suspension 30 mL  30 mL Oral Daily PRN Clovis Fredrickson, MD   30 mL at 09/02/15 0850  . OXcarbazepine (TRILEPTAL) tablet 150 mg  150 mg Oral BID WC Ipek Westra B Akirah Storck, MD   150 mg at 09/02/15 0846  . pantoprazole (PROTONIX) EC tablet  40 mg  40 mg Oral Daily Jeni Duling B Ashraf Mesta, MD   40 mg at 09/02/15 0845  . topiramate (TOPAMAX) tablet 100 mg  100 mg Oral QHS Clovis Fredrickson, MD   100 mg at 09/01/15 2116  . traZODone (DESYREL) tablet 100 mg  100 mg Oral QHS Clovis Fredrickson, MD   100 mg at 09/01/15 2116    Lab Results: No results found for this or any previous visit (from the past 48 hour(s)).  Blood Alcohol level:  Lab Results  Component Value Date   [facility] <5 08/29/2015   ETH <11 [id number]    Metabolic Disorder Labs: Lab Results  Component Value Date   HGBA1C 5.2 05/19/2013   MPG 103 05/19/2013   No results found for: PROLACTIN Lab Results  Component Value Date   CHOL 147 05/19/2013   TRIG 119 05/19/2013   HDL 57 05/19/2013   CHOLHDL 2.6 05/19/2013   VLDL 24 05/19/2013   LDLCALC 66 05/19/2013   LDLCALC 109* 01/03/2013    Physical Findings: AIMS: Facial and Oral Movements Muscles of Facial Expression: None, normal Lips and Perioral Area: None, normal Jaw: None, normal Tongue: None, normal,Extremity Movements Upper (arms, wrists, hands, fingers): None, normal Lower (legs, knees, ankles, toes): None, normal, Trunk Movements Neck, shoulders, hips: None, normal, Overall Severity Severity of abnormal movements (highest score from questions above): None, normal Incapacitation due to abnormal movements: None, normal Patient's awareness of abnormal movements (rate only patient's report): No Awareness, Dental Status Current problems with teeth and/or dentures?: No Does patient usually wear dentures?: No  CIWA:  CIWA-Ar Total: 0 COWS:     Musculoskeletal: Strength & Muscle Tone: within normal limits Gait & Station: normal Patient leans: N/A  Psychiatric Specialty Exam: Physical Exam  Nursing note and vitals reviewed.   Review of Systems  Psychiatric/Behavioral: Positive for depression. The patient is nervous/anxious and has insomnia.   All other systems reviewed and are negative.    Blood pressure 101/64, pulse 84, temperature 98.6 F (37 C), temperature source Oral, resp. rate 18, height 5\' 7"  (1.702 m), weight 83.008 kg (183 lb), SpO2 100 %.Body mass index is 28.66 kg/(m^2).  General Appearance: Casual  Eye Contact:  Good  Speech:  Clear and Coherent  Volume:  Normal  Mood:  Anxious and Depressed  Affect:  Appropriate  Thought Process:  Goal Directed  Orientation:  Full (Time, Place, and Person)  Thought Content:  WDL  Suicidal Thoughts:  Yes.  with intent/plan  Homicidal Thoughts:  No  Memory:  Immediate;  Fair Recent;   Fair Remote;   Fair  Judgement:  Impaired  Insight:  Shallow  Psychomotor Activity:  Normal  Concentration:  Concentration: Fair and Attention Span: Fair  Recall:  [employer] of Knowledge:  Fair  Language:  Fair  Akathisia:  No  Handed:  Right  AIMS (if indicated):     Assets:  Communication Skills Desire for Improvement Housing Physical Health Resilience Social Support  ADL's:  Intact  Cognition:  WNL  Sleep:  Number of Hours: 6.45     Treatment Plan Summary: Daily contact with patient to assess and evaluate symptoms and progress in treatment and Medication management   [name] is a 50 year old female with history of depression, mood instability, anxiety, suicide attempts admitted after a Xanax overdose in the context of severe social stressors.  1. Suicidal ideation. The patient is able to contract for safety in the hospital.  2. Mood. We started Trileptal for mood stabilization and Luvox for depression and anxiety.   3. Hypertension. She is on lisinopril.   4. GERD. She is on Protonix.  5. Xanax use. The patient denies substance abuse problems. Xanax was recently prescribed by her primary care provider.  6. Insomnia. Trazodone is available  7. Migraines. We continue Topamax for migraine prevention.  8. Skin rash and itching. We increased the Vistaril to 50 mg as needed.   9. UTI. Urinalysis was ordered. We  treated with a dose of Fosfomycin.  10. B12 defficiency. B12 level pending.  11. Disposition. She will be discharged to home. She will follow up with [facility] in [place].  Orson Slick, MD 09/02/2015, 2:53 PM

## 2015-09-02 NOTE — Progress Notes (Signed)
Patient with appropriate affect, and cooperative behavior. No SI/HI at this time. Actively participating in plan of care. Meets with MD to report s/s of UTI such as burning on urination and increased frequency. MD orders OTO of Monorul and med administered. Patient c/o constipation, MD aware and Colace, Dulcolax and milk of magnesia administered. Patient aware to report results to staff. Attending therapy groups. Safety maintained.

## 2015-09-02 NOTE — Discharge Summary (Signed)
Physician Discharge Summary Note  Patient:  [name] is an 50 y.o., female MRN:  [id number] DOB:  1965/09/24 Patient phone:  [phone] (home)  Patient address:   [address] [place] [address],  Total Time spent with patient: 30 minutes  Date of Admission:  08/30/2015 Date of Discharge: 09/03/2015  Reason for Admission:  Suicide attempt.  Identifying data. [name] is a 50 year old female with a history of depression, mood instability, anxiety and suicide attempts.  Chief complaint. "I am under a lot of stress."  History of present illness. Information was obtained from the patient and the chart. The patient has a long history of depression, anxiety and mood instability with multiple hospital admissions. She came to [facility] emergency 8 pills 0.5 mg Xanax. She recently lost her job and is worried about losing her house. She became increasingly depressed with poor sleep, decreased appetite, anhedonia, feeling of guilt and hopelessness worthlessness, poor energy and concentration, social isolation and crying spells. She complains of heightened anxiety and went to see her primary doctor prescribes the Xanax. Over the past weekend she had serious discussions with her boyfriend of 6 months. It seemed that the relationship is over. The patient sent messages to her children and friends and overdose on Xanax. When she woke up she decided to drive herself to the hospital for help. She denies psychotic symptoms. She reports symptoms suggestive of bipolar mania the past with insomnia, hyperactivity, out of character behavior, racing thoughts, pressured and loud speech, impulsive behavior. She reports panic attacks but no other forms of anxiety except for excessive worries. She denies problems with alcohol or illicit substances.  Past psychiatric history. She was hospitalized 3 times in the past, twice for suicide attempt by overdose. This was always related to relationship  problems including divorces. She was tried on Effexor, Zoloft, trazodone, Vistaril and now Xanax with no improvement.  Family psychiatric history. There is a history of completed suicide by the gun on her mother's family.  Social history. She was married 3 times. She is divorced now. She has 2 adult children and grandchildren. She used to work as a Quarry manager. She has no health insurance.  Principal Problem: Bipolar II disorder [facility]) Discharge Diagnoses: Patient Active Problem List   Diagnosis Date Noted  . Bipolar II disorder ([place]) [F31.81] 08/30/2015  . Suicide attempt by drug ingestion ([place]) [T50.902A] 08/30/2015  . Suicidal ideation [R45.851] 08/30/2015  . Migraines [G43.909] 09/29/2014  . Headache(784.0) [R51] 05/19/2013  . Left-sided weakness [M62.89] 05/18/2013  . Numbness and tingling of left arm and leg [R20.2] 05/18/2013  . CVA (cerebral infarction) [I63.9] 05/18/2013  . Hypertension [I10]   . Anxiety [F41.9]    Past Medical History:  Past Medical History  Diagnosis Date  . Depression   . Hypertension   . Anxiety   . Rosacea     Past Surgical History  Procedure Laterality Date  . Appendectomy    . Cholecystectomy    . Tonsillectomy    . Lumbar      ruptured disc in lumbar taken out  . Back surgery    . Appendectomy    . Cholecystectomy    . Abdominal hysterectomy      non cancerous  . Rotator cuff repair     Family History:  Family History  Problem Relation Age of Onset  . Diabetes Father   . Heart disease Father 2  . Hypertension Father   . Cancer Maternal Grandfather  colon cancer (70's)  . Diabetes Paternal Grandmother   . Diabetes Paternal Grandfather   . Cancer Cousin     breast   Social History:  History  Alcohol Use  . 0.0 oz/week  . 0 Standard drinks or equivalent per week    Comment: occassionally     History  Drug Use No    Social History   Social History  . Marital Status: Divorced    Spouse Name: N/A  . Number of Children: N/A   . Years of Education: N/A   Social History Main Topics  . Smoking status: Former Research scientist (life sciences)  . Smokeless tobacco: Never Used  . Alcohol Use: 0.0 oz/week    0 Standard drinks or equivalent per week     Comment: occassionally  . Drug Use: No  . Sexual Activity: Yes   Other Topics Concern  . None   Social History Narrative    Hospital Course:    [name] is a 50 year old female with history of depression, mood instability, anxiety, suicide attempts admitted after a Xanax overdose in the context of severe social stressors.  1. Suicidal ideation. This has resolved. The patient is able to contract for safety. She is forward thinking and optimistic about the future.   2. Mood. We started Trileptal for mood stabilization and Luvox for depression and anxiety.   3. Hypertension. She is on lisinopril.   4. GERD. She is on Protonix.  5. Xanax use. The patient denies substance abuse problems. Xanax was recently prescribed by her primary care provider.  6. Insomnia. Trazodone is available  7. Migraines. We continue Topamax for migraine prevention.  8. Skin rash and itching. Vistaril was available.    9. UTI. Urinalysis was ordered. We treated with a dose of Fosfomycin.  10. B12 defficiency. B12 level pending.  11. Disposition. She will be discharged to home. She will follow up with [facility] in [place].  Physical Findings: AIMS: Facial and Oral Movements Muscles of Facial Expression: None, normal Lips and Perioral Area: None, normal Jaw: None, normal Tongue: None, normal,Extremity Movements Upper (arms, wrists, hands, fingers): None, normal Lower (legs, knees, ankles, toes): None, normal, Trunk Movements Neck, shoulders, hips: None, normal, Overall Severity Severity of abnormal movements (highest score from questions above): None, normal Incapacitation due to abnormal movements: None, normal Patient's awareness of abnormal movements (rate only patient's report): No Awareness,  Dental Status Current problems with teeth and/or dentures?: No Does patient usually wear dentures?: No  CIWA:  CIWA-Ar Total: 0 COWS:     Musculoskeletal: Strength & Muscle Tone: within normal limits Gait & Station: normal Patient leans: N/A  Psychiatric Specialty Exam: Physical Exam  Nursing note and vitals reviewed.   Review of Systems  Psychiatric/Behavioral: The patient is nervous/anxious.   All other systems reviewed and are negative.   Blood pressure 101/64, pulse 84, temperature 98.6 F (37 C), temperature source Oral, resp. rate 18, height 5\' 7"  (1.702 m), weight 83.008 kg (183 lb), SpO2 100 %.Body mass index is 28.66 kg/(m^2).  See SRA.                                                  Sleep:  Number of Hours: 6.45     Have you used any form of tobacco in the last 30 days? (Cigarettes, Smokeless Tobacco, Cigars, and/or Pipes): No  Has this patient used any form of tobacco in the last 30 days? (Cigarettes, Smokeless Tobacco, Cigars, and/or Pipes) Yes, Yes, A prescription for an FDA-approved tobacco cessation medication was offered at discharge and the patient refused  Blood Alcohol level:  Lab Results  Component Value Date   [facility] <5 08/29/2015   ETH <11 [id number]    Metabolic Disorder Labs:  Lab Results  Component Value Date   HGBA1C 5.2 05/19/2013   MPG 103 05/19/2013   No results found for: PROLACTIN Lab Results  Component Value Date   CHOL 147 05/19/2013   TRIG 119 05/19/2013   HDL 57 05/19/2013   CHOLHDL 2.6 05/19/2013   VLDL 24 05/19/2013   LDLCALC 66 05/19/2013   LDLCALC 109* 01/03/2013    See Psychiatric Specialty Exam and Suicide Risk Assessment completed by Attending Physician prior to discharge.  Discharge destination:  Home  Is patient on multiple antipsychotic therapies at discharge:  No   Has Patient had three or more failed trials of antipsychotic monotherapy by history:  No  Recommended Plan for Multiple  Antipsychotic Therapies: NA  Discharge Instructions    Diet - low sodium heart healthy    Complete by:  As directed      Increase activity slowly    Complete by:  As directed             Medication List    TAKE these medications      Indication   amLODipine 5 MG tablet  Commonly known as:  NORVASC  Take 1 tablet (5 mg total) by mouth daily.   Indication:  High Blood Pressure     fluvoxaMINE 100 MG tablet  Commonly known as:  LUVOX  Take 1 tablet (100 mg total) by mouth at bedtime.   Indication:  Depression, Obsessive Compulsive Disorder     hydrOXYzine 50 MG tablet  Commonly known as:  ATARAX/VISTARIL  Take 1 tablet (50 mg total) by mouth every 4 (four) hours as needed for itching.   Indication:  Anxiety Neurosis, Itching with Atopic Dermatitis     omeprazole 20 MG capsule  Commonly known as:  PRILOSEC  Take 20 mg by mouth 2 (two) times daily as needed (for acid reflux/GERD).   Indication:  Gastroesophageal Reflux Disease     OXcarbazepine 150 MG tablet  Commonly known as:  TRILEPTAL  Take 1 tablet (150 mg total) by mouth 2 (two) times daily with a meal.   Indication:  Manic-Depression     topiramate 100 MG tablet  Commonly known as:  TOPAMAX  Take 2 tablets (200 mg total) by mouth at bedtime.   Indication:  Migraine Headache, Manic-Depression that is Resistant to Treatment     traZODone 100 MG tablet  Commonly known as:  DESYREL  Take 1 tablet (100 mg total) by mouth at bedtime.   Indication:  Trouble Sleeping           Follow-up Information    Go to [facility]   Why:  Please arrive on Wed July 5th 2017 at 12noon for your assesment for medication management and therapy.  Please bring your photo id.     Contact information:   [facility] [address], [place] [address] Ph: [phone]       Follow-up recommendations:  Activity:  as tolerated. Diet:  low sodium hearthealthy. Other:  keep follow up  appointments.  Comments:    Signed: Orson Slick, MD 09/02/2015, 4:44  PM

## 2015-09-02 NOTE — Progress Notes (Signed)
Recreation Therapy Notes  Date: 06.29.17 Time: 1:00 pm Location: Craft Room  Group Topic: Leisure Education  Goal Area(s) Addresses:  Patient will identify activities for each letter of the alphabet. Patient will verbalize ability to integrate positive leisure into life post d/c. Patient will verbalize ability to use leisure as a Technical sales engineer.  Behavioral Response: Attentive, Interactive  Intervention: Leisure Alphabet  Activity: Patients were given a Leisure Air traffic controller and instructed to identify a leisure activity for each letter of the alphabet.  Education: LRT educated patient on what they need to participate in leisure.  Education Outcome: Acknowledges education/In group clarification offered   Clinical Observations/Feedback: Patient completed activity by writing healthy leisure activities. Patient contributed to group discussion by stating some healthy leisure activities, what she needs to participate in leisure, how she can put leisure back in her schedule, and why leisure is a good coping skill.  Leonette Monarch, LRT/CTRS 09/02/2015 2:54 PM

## 2015-09-02 NOTE — Progress Notes (Signed)
Recreation Therapy Notes   Patient spoke with LRT and requested making a list of healthy coping skills. LRT met with patient at approximately 3:20 pm in patient's room. Patient verbalized 32 healthy coping skills. LRT encouraged patient to use the healthy coping skills.  Leonette Monarch, LRT/CTRS 09/02/2015 3:48 PM

## 2015-09-02 NOTE — BHH Suicide Risk Assessment (Addendum)
[facility] Discharge Suicide Risk Assessment   Principal Problem: Major depressive disorder, recurrent severe without psychotic features [facility]) Discharge Diagnoses:  Patient Active Problem List   Diagnosis Date Noted  . Major depressive disorder, recurrent severe without psychotic features ([place]) [F33.2] 08/30/2015  . Suicide attempt by drug ingestion ([place]) [T50.902A] 08/30/2015  . Suicidal ideation [R45.851] 08/30/2015  . Migraines [G43.909] 09/29/2014  . Headache(784.0) [R51] 05/19/2013  . Left-sided weakness [M62.89] 05/18/2013  . Numbness and tingling of left arm and leg [R20.2] 05/18/2013  . CVA (cerebral infarction) [I63.9] 05/18/2013  . Hypertension [I10]   . Anxiety [F41.9]     Total Time spent with patient: 30 minutes  Musculoskeletal: Strength & Muscle Tone: within normal limits Gait & Station: normal Patient leans: N/A  Psychiatric Specialty Exam: Review of Systems  Psychiatric/Behavioral: The patient is nervous/anxious.   All other systems reviewed and are negative.   Blood pressure 101/64, pulse 84, temperature 98.6 F (37 C), temperature source Oral, resp. rate 18, height 5\' 7"  (1.702 m), weight 83.008 kg (183 lb), SpO2 100 %.Body mass index is 28.66 kg/(m^2).  General Appearance: Casual  Eye Contact::  Good  Speech:  Clear and [id number]  Volume:  Normal  Mood:  Anxious  Affect:  Appropriate  Thought Process:  Goal Directed  Orientation:  Full (Time, Place, and Person)  Thought Content:  WDL  Suicidal Thoughts:  No  Homicidal Thoughts:  No  Memory:  Immediate;   Fair Recent;   Fair Remote;   Fair  Judgement:  Impaired  Insight:  Shallow  Psychomotor Activity:  Normal  Concentration:  Fair  Recall:  [place]  Language: Fair  Akathisia:  No  Handed:  Right  AIMS (if indicated):     Assets:  Communication Skills Desire for Improvement Housing Physical Health Resilience Social Support Transportation Vocational/Educational  Sleep:   Number of Hours: 6.45  Cognition: WNL  ADL's:  Intact   Mental Status Per Nursing Assessment::   On Admission:  Self-harm thoughts  Demographic Factors:  Divorced or widowed, Caucasian and Unemployed  Loss Factors: Decrease in vocational status, Loss of significant relationship and Financial problems/change in socioeconomic status  Historical Factors: Prior suicide attempts, Family history of mental illness or substance abuse and Impulsivity  Risk Reduction Factors:   Sense of responsibility to family and Positive social support  Continued Clinical Symptoms:  Bipolar Disorder:   Bipolar II Depressive phase Obsessive-Compulsive Disorder  Cognitive Features That Contribute To Risk:  None    Suicide Risk:  Minimal: No identifiable suicidal ideation.  Patients presenting with no risk factors but with morbid ruminations; may be classified as minimal risk based on the severity of the depressive symptoms  Follow-up Information    Go to [facility]   Why:  Please arrive on Wed July 5th 2017 at 12noon for your assesment for medication management and therapy.  Please bring your photo id.     Contact information:   [facility] [address] [place], [place] [address] Ph: [phone]       Plan Of Care/Follow-up recommendations:  Activity:  as tolerated. Diet:  low sodium heart healthy. Other:  keep followupappintments.  Orson Slick, MD 09/02/2015, 4:33 PM

## 2015-09-02 NOTE — Progress Notes (Signed)
A&Ox3, denied pain, denied SI/HI, observed in room awake in the bed; receptive, appropriate in mood and affect

## 2015-09-02 NOTE — Plan of Care (Signed)
Problem: Self-Concept: Goal: Ability to disclose and discuss suicidal ideas will improve Outcome: Adequate for Discharge SI denied, no form of hallucinations; gait steady no residual of CVA, medication compliant, awaiting lab result of Methylmalonic acid, serum

## 2015-09-02 NOTE — BHH Group Notes (Signed)
[place] LCSW Group Therapy   09/02/2015 9:15 am   Type of Therapy: Group Therapy   Participation Level: Active   Participation Quality: Attentive, Sharing and Supportive   Affect: Appropriate   Cognitive: Alert and Oriented   Insight: Developing/Improving and Engaged   Engagement in Therapy: Developing/Improving and Engaged   Modes of Intervention: Clarification, Confrontation, Discussion, Education, Exploration, Limit-setting, Orientation, Problem-solving, Rapport Building, Art therapist, Socialization and Support   Summary of Progress/Problems: The topic for group was balance in life. Today's group focused on defining balance in one's own words, identifying things that can knock one off balance, and exploring healthy ways to maintain balance in life. Group members were asked to provide an example of a time when they felt off balance, describe how they handled that situation, and process healthier ways to regain balance in the future. Group members were asked to share the most important tool for maintaining balance that they learned while at [facility] and how they plan to apply this method after discharge. Pt reported that the pt considered a smart goal that the pt can utilize to remain balanced means for the pt to recover mentally and physically. Pt shared with the group the pt's goal of learning of coping with negative life experiences in healthy ways. Pt shared an occasion when the pt once felt off balance and focused on the actions of others instead of practicing self-care and how in the future the pt intends to separate themselves from their feelings. Pt was polite and cooperative with the CSW and other group members and focused and attentive to the topics discussed and the sharing of others.  Alphonse Guild. Bellah Alia, LCSWA, LCAS  09/02/15

## 2015-09-02 NOTE — Plan of Care (Signed)
Problem: [facility] Participation in Recreation Therapeutic Interventions Goal: STG-Patient will demonstrate improved self esteem by identif STG: Self-Esteem - Within 4 treatment sessions, patient will verbalize at least 5 positive affirmation statements in each of 2 treatment sessions to increase self-esteem post d/c.  Outcome: Completed/Met Date Met:  09/02/15 Treatment Session 2; Completed 2 out of 2: At approximately 10:35 am, LRT met with patient in community room. Patient verbalized 5 positive affirmation statements. Patient reported it felt "good". LRT encouraged patient to continue saying positive affirmation statements.  Leonette Monarch, LRT/CTRS 06.29.17 10:54 am Goal: STG-Other Recreation Therapy Goal (Specify) STG: Stress Management - Within 4 treatment sessions, patient will verbalize understanding of the stress management techniques in each of 2 treatment sessions to increase stress management skills post d/c.  Outcome: Completed/Met Date Met:  09/02/15 Treatment Session 2; Completed 2 out of 2: At approximately 10:35 am, LRT met with patient in community room. Patient reported she read over and practiced some of the stress management techniques. Patient verbalized understanding and reported the techniques were helpful. LRT encouraged patient to continue practicing the stress management techniques.  Leonette Monarch, LRT/CTRS 06.29.17 10:56 am

## 2015-09-02 NOTE — BHH Group Notes (Signed)
[place] Group Notes:  (Nursing/MHT/Case Management/Adjunct)  Date:  09/02/2015  Time:  5:07 AM  Type of Therapy:  Group Therapy  Participation Level:  Active  Participation Quality:  Appropriate  Affect:  Appropriate  Cognitive:  Appropriate  Insight:  Appropriate  Engagement in Group:  Engaged  Modes of Intervention:  n/a  Summary of Progress/Problems:  Marylynn Pearson 09/02/2015, 5:07 AM

## 2015-09-02 NOTE — Plan of Care (Signed)
Problem: Coping: Goal: Ability to cope will improve Outcome: Progressing Patient actively participating in plan of care.

## 2015-09-03 DIAGNOSIS — F332 Major depressive disorder, recurrent severe without psychotic features: Secondary | ICD-10-CM | POA: Insufficient documentation

## 2015-09-03 MED ORDER — TOPIRAMATE 100 MG PO TABS: 200.0000 mg | ORAL_TABLET | Freq: Every day | ORAL | Status: DC

## 2015-09-03 MED ORDER — TRAZODONE HCL 100 MG PO TABS: 100.0000 mg | ORAL_TABLET | Freq: Every day | ORAL | Status: DC

## 2015-09-03 MED ORDER — AMLODIPINE BESYLATE 5 MG PO TABS: 5.0000 mg | ORAL_TABLET | Freq: Every day | ORAL | Status: DC

## 2015-09-03 MED ORDER — OXCARBAZEPINE 150 MG PO TABS: 150.0000 mg | ORAL_TABLET | Freq: Two times a day (BID) | ORAL | Status: DC

## 2015-09-03 MED ORDER — FLUVOXAMINE MALEATE 100 MG PO TABS: 100.0000 mg | ORAL_TABLET | Freq: Every day | ORAL | Status: DC

## 2015-09-03 MED ORDER — HYDROXYZINE HCL 50 MG PO TABS: 50.0000 mg | ORAL_TABLET | ORAL | Status: DC | PRN

## 2015-09-03 NOTE — Progress Notes (Signed)
D: Pt is pleasant and cooperative this evening. She is seen laying in bed most of the night. Pt continues to c/o constipation. Medications administered as ordered. Pt reports slight relief after administration of enema. Pt rates depression and anxiety 5/10. Denies SI/HI/AVH at this time. She rates pain 8/10 and requests PRN medication. A: Emotional support and encouragement provided. Medications administered with education. q15 minute safety checks maintained. R: Pt remains free from harm. Will continue to monitor.

## 2015-09-03 NOTE — BHH Group Notes (Signed)
[place] LCSW Group Therapy   09/03/2015 9:30 AM   Type of Therapy: Group Therapy   Participation Level: Invited but did not attend.    Emilie Rutter, MSW, Latanya Presser

## 2015-09-03 NOTE — Progress Notes (Signed)
  [facility] Adult Case Management Discharge Plan :  Will you be returning to the same living situation after discharge:  Yes,  pt will be returning to her sister's house in [place] At discharge, do you have transportation home?: Yes,  pt will be picked up by family Do you have the ability to pay for your medications: Yes,  pt will be provided with prescriptions at discharge  Release of information consent forms completed and in the chart;  Patient's signature needed at discharge.  Patient to Follow up at: Follow-up Information    Go to [facility]   Why:  Please arrive on Wed July 5th 2017 at 12noon for your assessment for medication management and therapy.  Please bring your photo id.     Contact information:   [facility] [place] [place], [place] [address] Ph: [phone] Fax: [phone]       Next level of care provider has access to [place] and Suicide Prevention discussed: Yes,  pt refused or No.  Have you used any form of tobacco in the last 30 days? (Cigarettes, Smokeless Tobacco, Cigars, and/or Pipes): No  Has patient been referred to the Quitline?: N/A patient is not a smoker  Patient has been referred for addiction treatment: Pt. refused referral  Alphonse Guild Alynna Hargrove 09/03/2015, 11:01 PM

## 2015-09-03 NOTE — Progress Notes (Signed)
Recreation Therapy Notes  INPATIENT RECREATION TR PLAN  Patient Details Name: [name] MRN: [id number] DOB: 1966/01/05 Today's Date: 09/03/2015  Rec Therapy Plan Is patient appropriate for Therapeutic Recreation?: Yes Treatment times per week: At least once a week TR Treatment/Interventions: 1:1 session, Group participation (Comment) (Appropriate participation in daily recreational therapy tx)  Discharge Criteria Pt will be discharged from therapy if:: Treatment goals are met, Discharged Treatment plan/goals/alternatives discussed and agreed upon by:: Patient/family  Discharge Summary Short term goals set: See Care Plan Short term goals met: Complete Progress toward goals comments: One-to-one attended Which groups?: Self-esteem, Leisure education, Other (Comment) (Self-expression) One-to-one attended: Self-esteem, stress management Reason goals not met: N/A Therapeutic equipment acquired: None Reason patient discharged from therapy: Discharge from hospital Pt/family agrees with progress & goals achieved: Yes Date patient discharged from therapy: 09/03/15   Leonette Monarch, LRT/CTRS 09/03/2015, 3:53 PM

## 2015-09-03 NOTE — BHH Group Notes (Signed)
[place] Group Notes:  (Nursing/MHT/Case Management/Adjunct)  Date:  09/03/2015  Time:  4:15 AM  Type of Therapy:  Psychoeducational Skills  Participation Level:  Active  Participation Quality:  Attentive  Affect:  Appropriate  Cognitive:  Appropriate  Insight:  Good  Engagement in Group:  Engaged and Limited  Modes of Intervention:  Discussion and Exploration  Summary of Progress/Problems:  Kathi Ludwig 09/03/2015, 4:15 AM

## 2015-09-03 NOTE — Plan of Care (Signed)
Problem: Coping: Goal: Ability to cope will improve Outcome: Progressing Pt discusses with Probation officer that she has been working on Radiographer, therapeutic that she can utilize while here and after discharge.  Problem: Self-Concept: Goal: Level of anxiety will decrease Outcome: Progressing Pt reports that her anxiety has decreased. She rates anxiety 5/10.

## 2015-09-03 NOTE — Tx Team (Signed)
Interdisciplinary Treatment Plan Update (Adult)  Date:  09/03/2015 Time Reviewed:  10:56 PM  Progress in Treatment: Attending groups: Yes. Participating in groups:  Yes. Taking medication as prescribed:  Yes. Tolerating medication:  Yes. Family/Significant othe contact made:  No, will contact:    Patient understands diagnosis:  Yes. Discussing patient identified problems/goals with staff:  Yes. Medical problems stabilized or resolved:  Yes. Denies suicidal/homicidal ideation: Yes. Issues/concerns per patient self-inventory:  No. Other:  New problem(s) identified: No, Describe:     Discharge Plan or Barriers: Discharge back home to [facility] and follow up with [facility] for medication management   Reason for Continuation of Hospitalization: Depression Medication stabilization Suicidal ideation  Comments:The patient has a long history of depression, anxiety and mood instability with multiple hospital admissions. She came to [facility] emergency 8 pills 0.5 mg Xanax. She recently lost her job and is worried about losing her house. She became increasingly depressed with poor sleep, decreased appetite, anhedonia, feeling of guilt and hopelessness worthlessness, poor energy and concentration, social isolation and crying spells. She complains of heightened anxiety and went to see her primary doctor prescribes the Xanax. Over the past weekend she had serious discussions with her boyfriend of 6 months. It seemed that the relationship is over. The patient sent messages to her children and friends and overdose on Xanax. When she woke up she decided to drive herself to the hospital for help. She denies psychotic symptoms. She reports symptoms suggestive of bipolar mania the past with insomnia, hyperactivity, out of character behavior, racing thoughts, pressured and loud speech, impulsive behavior. She reports panic attacks but no other forms of anxiety  except for excessive worries. She denies problems with alcohol or illicit substances.  Estimated length of stay: 2-3 days  New goal(s):  Review of initial/current patient goals per problem list:   1.  Goal(s): Patient will participate in aftercare plan * Met: Yes * Target date: at discharge * As evidenced by: Patient will participate within aftercare plan AEB aftercare provider and housing plan at discharge being identified.   6/30: Discharge back home to [facility] and follow up with [facility] for medication management 2.  Goal (s): Patient will exhibit decreased depressive symptoms and suicidal ideations. * Met: Adequate for discharge per MD. *  Target date: at discharge * As evidenced by: Patient will utilize self-rating of depression at 3 or below and demonstrate decreased signs of depression or be deemed stable for discharge by MD.  Adequate for discharge per MD.   3.  Goal(s): Patient will demonstrate decreased signs and symptoms of anxiety. * Met: Adequate for discharge per MD. * Target date: at discharge * As evidenced by: Patient will utilize self-rating of anxiety at 3 or below and demonstrated decreased signs of anxiety, or be deemed stable for discharge by MD  6/30: Adequate for discharge per MD.  Attendees: Patient:  [name] 6/30/201710:56 PM  Family:   6/30/201710:56 PM  Physician:  Orson Slick 6/30/201710:56 PM  Nursing:   Floyde Parkins, RN 6/30/201710:56 PM  Case Manager:   6/30/201710:56 PM  Counselor:  Marylou Flesher, LCSW 6/30/201710:56 PM  Other:  , LRT 6/30/201710:56 PM  Other:   6/30/201710:56 PM  Other:   6/30/201710:56 PM  Other:  6/30/201710:56 PM  Other:  6/30/201710:56 PM  Other:  6/30/201710:56 PM  Other:  6/30/201710:56 PM  Other:  6/30/201710:56 PM  Other:  6/30/201710:56 PM  Other:   6/30/201710:56  PM   Scribe for Treatment Team:   Cristina Gong, LCSW 09/03/2015, 10:56 PM

## 2015-09-03 NOTE — [facility] Suicide Risk Assessment (Signed)
[place] INPATIENT:  Family/Significant Other Suicide Prevention Education  Suicide Prevention Education:  Patient Refusal for Family/Significant Other Suicide Prevention Education: The patient [name] has refused to provide written consent for family/significant other to be provided Family/Significant Other Suicide Prevention Education during admission and/or prior to discharge.  Physician notified. Pt refused SPE from the CSW.  Alphonse Guild Vani Gunner 09/03/2015, 10:55 PM

## 2015-09-03 NOTE — Progress Notes (Signed)
Patient's discharge teaching and instructions discussed with patient and she verbalized understanding. Patient's belongings/contraband given back to the patient and she did not voice any concerns. Patient escorted to the lobby where family picked her up.

## 2015-09-06 LAB — METHYLMALONIC ACID, SERUM: METHYLMALONIC ACID, QUANTITATIVE: 261 nmol/L (ref 0–378)

## 2015-10-08 ENCOUNTER — Emergency Department (HOSPITAL_COMMUNITY)
Admission: EM | Admit: 2015-10-08 | Discharge: 2015-10-08 | Disposition: A | Discharge status: Home / Self Care | Payer: Self-pay | Attending: Emergency Medicine | Admitting: Emergency Medicine

## 2015-10-08 ENCOUNTER — Encounter (HOSPITAL_COMMUNITY): Payer: Self-pay | Admitting: Emergency Medicine

## 2015-10-08 DIAGNOSIS — R51 Headache: Secondary | ICD-10-CM | POA: Insufficient documentation

## 2015-10-08 DIAGNOSIS — I10 Essential (primary) hypertension: Secondary | ICD-10-CM | POA: Insufficient documentation

## 2015-10-08 DIAGNOSIS — R21 Rash and other nonspecific skin eruption: Secondary | ICD-10-CM | POA: Insufficient documentation

## 2015-10-08 DIAGNOSIS — Z87891 Personal history of nicotine dependence: Secondary | ICD-10-CM | POA: Insufficient documentation

## 2015-10-08 MED ORDER — HYDROXYZINE PAMOATE 25 MG PO CAPS: 25.0000 mg | ORAL_CAPSULE | Freq: Three times a day (TID) | ORAL | 0 refills | Status: DC | PRN

## 2015-10-08 MED ORDER — DEXAMETHASONE 4 MG PO TABS: 4.0000 mg | ORAL_TABLET | Freq: Two times a day (BID) | ORAL | 0 refills | Status: DC

## 2015-10-08 NOTE — Discharge Instructions (Signed)
Please use vistaril for itching, and decadron daily with food. Please see Dr Denna Haggard, or a member of his team concerning your rash.

## 2015-10-08 NOTE — ED Triage Notes (Signed)
Pt reports having a "rash" for "a couple months", reports she might have scabies. Pt states rash is in webbing of fingers, behind knees, and on torso. Denies any drainage or fever.

## 2015-10-08 NOTE — ED Provider Notes (Signed)
[facility] DEPT Provider Note   CSN: [id number] Arrival date & time: 10/08/15  1350  First Provider Contact:  First MD Initiated Contact with Patient 10/08/15 1421        History   Chief Complaint Chief Complaint  Patient presents with  . Rash    HPI [name] is a 50 y.o. female.  Patient is a 50 year old female who presents to the emergency department with a complaint of rash.   The patient states this problem started back in June, while she was in hospital for anxiety and depression issues on. She states that time she was later exposed to home with a dog on, and she is unsure if there were fleas or other mites involved. She continues to have the rash, and it seems to be spreading. She is concerned for possible scabies. The patient states that there is a mother and child in the home, and the mother has a few bumps on her back and the child has a few bumps but these look more like mosquito bites. There's been no recent change in medications, diet, or environment. Patient denies new soaps or dryer sheets.      Past Medical History:  Diagnosis Date  . Anxiety   . Depression   . Hypertension   . Rosacea     Patient Active Problem List   Diagnosis Date Noted  . Major depressive disorder, recurrent severe without psychotic features ([place])   . Bipolar II disorder ([place]) 08/30/2015  . Suicide attempt by drug ingestion ([place]) 08/30/2015  . Suicidal ideation 08/30/2015  . Migraines 09/29/2014  . Headache(784.0) 05/19/2013  . Left-sided weakness 05/18/2013  . Numbness and tingling of left arm and leg 05/18/2013  . CVA (cerebral infarction) 05/18/2013  . Hypertension   . Anxiety     Past Surgical History:  Procedure Laterality Date  . ABDOMINAL HYSTERECTOMY     non cancerous  . APPENDECTOMY    . appendectomy    . BACK SURGERY    . CHOLECYSTECTOMY    . CHOLECYSTECTOMY    . lumbar     ruptured disc in lumbar taken out  . ROTATOR CUFF REPAIR    . TONSILLECTOMY       OB History    Gravida Para Term Preterm AB Living             2   SAB TAB Ectopic Multiple Live Births                   Home Medications    Prior to Admission medications   Medication Sig Start Date End Date Taking? Authorizing Provider  amLODipine (NORVASC) 5 MG tablet Take 1 tablet (5 mg total) by mouth daily. 09/03/15   Clovis Fredrickson, MD  dexamethasone (DECADRON) 4 MG tablet Take 1 tablet (4 mg total) by mouth 2 (two) times daily with a meal. 10/08/15   Lily Kocher, PA-C  fluvoxaMINE (LUVOX) 100 MG tablet Take 1 tablet (100 mg total) by mouth at bedtime. 09/03/15   Clovis Fredrickson, MD  hydrOXYzine (ATARAX/VISTARIL) 50 MG tablet Take 1 tablet (50 mg total) by mouth every 4 (four) hours as needed for itching. 09/03/15   Clovis Fredrickson, MD  hydrOXYzine (VISTARIL) 25 MG capsule Take 1 capsule (25 mg total) by mouth 3 (three) times daily as needed for itching. 10/08/15   Lily Kocher, PA-C  omeprazole (PRILOSEC) 20 MG capsule Take 20 mg by mouth 2 (two) times daily as needed (for acid  reflux/GERD).     Historical Provider, MD  OXcarbazepine (TRILEPTAL) 150 MG tablet Take 1 tablet (150 mg total) by mouth 2 (two) times daily with a meal. 09/03/15   Jolanta B Pucilowska, MD  topiramate (TOPAMAX) 100 MG tablet Take 2 tablets (200 mg total) by mouth at bedtime. 09/03/15   Clovis Fredrickson, MD  traZODone (DESYREL) 100 MG tablet Take 1 tablet (100 mg total) by mouth at bedtime. 09/03/15   Clovis Fredrickson, MD    Family History Family History  Problem Relation Age of Onset  . Diabetes Father   . Heart disease Father 4  . Hypertension Father   . Cancer Maternal Grandfather     colon cancer (70's)  . Diabetes Paternal Grandmother   . Diabetes Paternal Grandfather   . Cancer Cousin     breast    Social History Social History  Substance Use Topics  . Smoking status: Former Research scientist (life sciences)  . Smokeless tobacco: Never Used  . Alcohol use No     Comment: occassionally      Allergies   Penicillins   Review of Systems Review of Systems  Skin: Positive for rash.  Neurological: Positive for headaches.  Psychiatric/Behavioral: The patient is nervous/anxious.        Depression  All other systems reviewed and are negative.    Physical Exam Updated Vital Signs BP 109/70 (BP Location: Left Arm)   Pulse 72   Temp 98.8 F (37.1 C) (Oral)   Resp 14   Ht 5\' 7"  (1.702 m)   Wt 84.4 kg   SpO2 100%   BMI 29.13 kg/m   Physical Exam  Constitutional: She is oriented to person, place, and time. She appears well-developed and well-nourished.  Non-toxic appearance.  HENT:  Head: Normocephalic.  Right Ear: Tympanic membrane and external ear normal.  Left Ear: Tympanic membrane and external ear normal.  Eyes: EOM and lids are normal. Pupils are equal, round, and reactive to light.  Neck: Normal range of motion. Neck supple. Carotid bruit is not present.  Cardiovascular: Normal rate, regular rhythm, normal heart sounds, intact distal pulses and normal pulses.   Pulmonary/Chest: Breath sounds normal. No respiratory distress.  Abdominal: Soft. Bowel sounds are normal. There is no tenderness. There is no guarding.  Musculoskeletal: Normal range of motion.  Lymphadenopathy:       Head (right side): No submandibular adenopathy present.       Head (left side): No submandibular adenopathy present.    She has no cervical adenopathy.  Neurological: She is alert and oriented to person, place, and time. She has normal strength. No cranial nerve deficit or sensory deficit.  Skin: Skin is warm and dry. Rash noted.  There is a red papular rash noted on the arms, in the web spaces of the hands, and on the back as well as on the abdomen. No red streaks. No drainage appreciated.  Psychiatric: She has a normal mood and affect. Her speech is normal.  Nursing note and vitals reviewed.    ED Treatments / Results  Labs (all labs ordered are listed, but only abnormal  results are displayed) Labs Reviewed - No data to display  EKG  EKG Interpretation None       Radiology No results found.  Procedures Procedures (including critical care time)  Medications Ordered in ED Medications - No data to display   Initial Impression / Assessment and Plan / ED Course  I have reviewed the triage vital signs and the  nursing notes.  Pertinent labs & imaging results that were available during my care of the patient were reviewed by me and considered in my medical decision making (see chart for details).  Clinical Course    *I have reviewed nursing notes, vital signs, and all appropriate lab and imaging results for this patient.  Final Clinical Impressions(s) / ED Diagnoses  Patient has a rash that has been present off and on since June. There is no specific pattern to the rash this time. I doubt scabies given the length of time and the pattern of presentation. Patient is referred to [place] [facility]. She is given prescription for Decadron and Vistaril. Patient is also advised to use Benadryl cream for additional assistance with itching if needed. The patient is in agreement with this discharge plan.    Final diagnoses:  Rash    New Prescriptions New Prescriptions   DEXAMETHASONE (DECADRON) 4 MG TABLET    Take 1 tablet (4 mg total) by mouth 2 (two) times daily with a meal.   HYDROXYZINE (VISTARIL) 25 MG CAPSULE    Take 1 capsule (25 mg total) by mouth 3 (three) times daily as needed for itching.     Lily Kocher, PA-C 10/08/15 [place], MD 10/10/15 [phone]

## 2015-11-06 ENCOUNTER — Emergency Department: Payer: Self-pay

## 2015-11-06 ENCOUNTER — Emergency Department
Admission: EM | Admit: 2015-11-06 | Discharge: 2015-11-06 | Disposition: A | Discharge status: Home / Self Care | Payer: Self-pay | Attending: Emergency Medicine | Admitting: Emergency Medicine

## 2015-11-06 DIAGNOSIS — R4182 Altered mental status, unspecified: Secondary | ICD-10-CM

## 2015-11-06 DIAGNOSIS — R569 Unspecified convulsions: Secondary | ICD-10-CM | POA: Insufficient documentation

## 2015-11-06 DIAGNOSIS — I10 Essential (primary) hypertension: Secondary | ICD-10-CM | POA: Insufficient documentation

## 2015-11-06 LAB — COMPREHENSIVE METABOLIC PANEL
ALK PHOS: 156 U/L — AB (ref 38–126)
ALT: 59 U/L — AB (ref 14–54)
AST: 43 U/L — AB (ref 15–41)
Albumin: 4.1 g/dL (ref 3.5–5.0)
Anion gap: 12 (ref 5–15)
BILIRUBIN TOTAL: 0.2 mg/dL — AB (ref 0.3–1.2)
BUN: 13 mg/dL (ref 6–20)
CALCIUM: 8.9 mg/dL (ref 8.9–10.3)
CO2: 20 mmol/L — ABNORMAL LOW (ref 22–32)
CREATININE: 0.67 mg/dL (ref 0.44–1.00)
Chloride: 106 mmol/L (ref 101–111)
GFR calc Af Amer: >60 mL/min (ref >60)
Glucose, Bld: 105 mg/dL — ABNORMAL HIGH (ref 65–99)
Potassium: 4 mmol/L (ref 3.5–5.1)
Sodium: 138 mmol/L (ref 135–145)
TOTAL PROTEIN: 7.7 g/dL (ref 6.5–8.1)

## 2015-11-06 LAB — URINE DRUG SCREEN, QUALITATIVE (ARMC ONLY)
Amphetamines, Ur Screen: NOT DETECTED
Barbiturates, Ur Screen: NOT DETECTED
Benzodiazepine, Ur Scrn: NOT DETECTED
CANNABINOID 50 NG, UR ~~LOC~~: NOT DETECTED
COCAINE METABOLITE, UR ~~LOC~~: NOT DETECTED
MDMA (ECSTASY) UR SCREEN: NOT DETECTED
Methadone Scn, Ur: NOT DETECTED
OPIATE, UR SCREEN: NOT DETECTED
Phencyclidine (PCP) Ur S: NOT DETECTED
TRICYCLIC, UR SCREEN: NOT DETECTED

## 2015-11-06 LAB — CBC
HEMATOCRIT: 39.8 % (ref 35.0–47.0)
Hemoglobin: 13.7 g/dL (ref 12.0–16.0)
MCH: 29.7 pg (ref 26.0–34.0)
MCHC: 34.4 g/dL (ref 32.0–36.0)
MCV: 86.4 fL (ref 80.0–100.0)
Platelets: 344 10*3/uL (ref 150–440)
RBC: 4.61 MIL/uL (ref 3.80–5.20)
RDW: 13.7 % (ref 11.5–14.5)
WBC: 17 10*3/uL — AB (ref 3.6–11.0)

## 2015-11-06 LAB — ACETAMINOPHEN LEVEL: Acetaminophen (Tylenol), Serum: <10 ug/mL — ABNORMAL LOW (ref 10–30)

## 2015-11-06 LAB — TROPONIN I: Troponin I: <0.03 ng/mL (ref <0.03)

## 2015-11-06 LAB — SALICYLATE LEVEL: Salicylate Lvl: <4 mg/dL (ref 2.8–30.0)

## 2015-11-06 LAB — CK: CK TOTAL: 100 U/L (ref 38–234)

## 2015-11-06 LAB — GLUCOSE, CAPILLARY: Glucose-Capillary: 107 mg/dL — ABNORMAL HIGH (ref 65–99)

## 2015-11-06 MED ORDER — LORAZEPAM 2 MG/ML IJ SOLN
2.0000 mg | Freq: Once | INTRAMUSCULAR | Status: AC
  Administered 2015-11-06: 2 mg via INTRAVENOUS

## 2015-11-06 MED ORDER — LORAZEPAM 0.5 MG PO TABS: 0.5000 mg | ORAL_TABLET | Freq: Three times a day (TID) | ORAL | 0 refills | Status: AC | PRN

## 2015-11-06 MED ORDER — LORAZEPAM 2 MG/ML IJ SOLN
INTRAMUSCULAR | Status: AC
  Administered 2015-11-06: 2 mg via INTRAVENOUS
  Filled 2015-11-06: qty 1

## 2015-11-06 MED ORDER — LORAZEPAM 2 MG/ML IJ SOLN
INTRAMUSCULAR | Status: AC
  Filled 2015-11-06: qty 1

## 2015-11-06 MED ORDER — SODIUM CHLORIDE 0.9 % IV BOLUS (SEPSIS)
1000.0000 mL | Freq: Once | INTRAVENOUS | Status: AC
  Administered 2015-11-06: 1000 mL via INTRAVENOUS

## 2015-11-06 NOTE — ED Triage Notes (Addendum)
Pt arrives to ER via POV with a female from home c/o "not acting right". Accompanying female states that they were riding down the road and pt began with seizure like activity. Pt arrives wet of urine, dried blood to mouth, and bruises to bilateral legs and left arm. Pt confused, disoriented to place, time and situation. Pt is able to recall name and birth date after much prompting. . Female states that he does not know of any medical hx of patient, he has only known her X 2 months. Pt combative, trying to get up, stating "please let me go". MD at bedside.

## 2015-11-06 NOTE — ED Notes (Signed)
Pt. Has multiple bruises on legs and upper extremities of varying age.

## 2015-11-06 NOTE — ED Notes (Signed)
Pt. Going home with family. 

## 2015-11-06 NOTE — ED Triage Notes (Signed)
Pt arrives via pov

## 2015-11-06 NOTE — ED Notes (Signed)
Pt. Stated she needed to use bathroom.  Pt. Unable to use bed pan and becoming agitated.  Pt. Asked if she needed something to relax.  Pt. Agreed.

## 2015-11-06 NOTE — ED Provider Notes (Signed)
[facility] Emergency Department Provider Note   ____________________________________________   None    (approximate)  I have reviewed the triage vital signs and the nursing notes.   HISTORY  Chief Complaint Altered Mental Status  EM caveat: Patient not able to give history, patient confused  HPI Quentin Strebel is a 50 y.o. female who was with a female patient who met her today. He reports they were driving to [facility], and the patient had suddenly tensed up in the passenger seat of the car and then began shaking. This lasted a few minutes, he brought her to the emergency room. Witness described the event as a possible "seizure".  Patient was noted to bite her tongue during the episode. She had also urinated on herself.   Past Medical History:  Diagnosis Date  . Anxiety   . Depression   . Hypertension   . Rosacea     Patient Active Problem List   Diagnosis Date Noted  . Major depressive disorder, recurrent severe without psychotic features ([place])   . Bipolar II disorder ([place]) 08/30/2015  . Suicide attempt by drug ingestion ([place]) 08/30/2015  . Suicidal ideation 08/30/2015  . Migraines 09/29/2014  . Headache(784.0) 05/19/2013  . Left-sided weakness 05/18/2013  . Numbness and tingling of left arm and leg 05/18/2013  . CVA (cerebral infarction) 05/18/2013  . Hypertension   . Anxiety     Past Surgical History:  Procedure Laterality Date  . ABDOMINAL HYSTERECTOMY     non cancerous  . APPENDECTOMY    . appendectomy    . BACK SURGERY    . CHOLECYSTECTOMY    . CHOLECYSTECTOMY    . lumbar     ruptured disc in lumbar taken out  . ROTATOR CUFF REPAIR    . TONSILLECTOMY      Prior to Admission medications   Medication Sig Start Date End Date Taking? Authorizing Provider  amLODipine (NORVASC) 5 MG tablet Take 1 tablet (5 mg total) by mouth daily. 09/03/15   Clovis Fredrickson, MD  dexamethasone (DECADRON) 4 MG tablet Take 1 tablet (4 mg  total) by mouth 2 (two) times daily with a meal. 10/08/15   Lily Kocher, PA-C  fluvoxaMINE (LUVOX) 100 MG tablet Take 1 tablet (100 mg total) by mouth at bedtime. 09/03/15   Clovis Fredrickson, MD  hydrOXYzine (ATARAX/VISTARIL) 50 MG tablet Take 1 tablet (50 mg total) by mouth every 4 (four) hours as needed for itching. 09/03/15   Clovis Fredrickson, MD  hydrOXYzine (VISTARIL) 25 MG capsule Take 1 capsule (25 mg total) by mouth 3 (three) times daily as needed for itching. 10/08/15   Lily Kocher, PA-C  omeprazole (PRILOSEC) 20 MG capsule Take 20 mg by mouth 2 (two) times daily as needed (for acid reflux/GERD).     Historical Provider, MD  OXcarbazepine (TRILEPTAL) 150 MG tablet Take 1 tablet (150 mg total) by mouth 2 (two) times daily with a meal. 09/03/15   Jolanta B Pucilowska, MD  topiramate (TOPAMAX) 100 MG tablet Take 2 tablets (200 mg total) by mouth at bedtime. 09/03/15   Clovis Fredrickson, MD  traZODone (DESYREL) 100 MG tablet Take 1 tablet (100 mg total) by mouth at bedtime. 09/03/15   Clovis Fredrickson, MD    Allergies Penicillins  Family History  Problem Relation Age of Onset  . Diabetes Father   . Heart disease Father 104  . Hypertension Father   . Cancer Maternal Grandfather  colon cancer (70's)  . Diabetes Paternal Grandmother   . Diabetes Paternal Grandfather   . Cancer Cousin     breast    Social History Social History  Substance Use Topics  . Smoking status: Former Research scientist (life sciences)  . Smokeless tobacco: Never Used  . Alcohol use No     Comment: occassionally    Review of Systems EM caveat The person accompanying the patient reports that she was acting normal and talkative, behaving normally when they initially got in the car and this event occurred suddenly. ____________________________________________   PHYSICAL EXAM:  VITAL SIGNS: ED Triage Vitals  Enc Vitals Group     BP 11/06/15 1504 (!) 115/114     Pulse Rate 11/06/15 1502 (!) 129     Resp 11/06/15  1502 (!) 28     Temp --      Temp src --      SpO2 11/06/15 1502 95 %     Weight --      Height --      Head Circumference --      Peak Flow --      Pain Score --      Pain Loc --      Pain Edu? --      Excl. in [place]? --     Constitutional: Patient, thrashing about in the bed, saying "I need to get up", somewhat combative against staff.  Eyes: Conjunctivae are slightly injected. PERRL. EOMI. Head: Atraumatic. Nose: No congestion/rhinnorhea. Mouth/Throat: Mucous membranes are moist.  Oropharynx non-erythematous. Dried blood in the mouth, no obvious laceration noted Neck: No stridor.   Cardiovascular: Tachycardic rate, regular rhythm. Grossly normal heart sounds.  Good peripheral circulation. Respiratory: Tachypnea No retractions. Lungs CTAB. Clear sentences. No wheezing. Gastrointestinal: Soft and nontender. No distention.  Musculoskeletal: No lower extremity tenderness nor edema.  No joint effusions. Multiple bruises noted in various stages over the lower thighs and forearms bilateral. Neurologic:  Normal speech but combative, thrashing against staff stating she wants to stand up. She appears to move all extremities well, and no focal deficit is noted.  Skin:  Skin is warm, dry and intact. No rash noted. Psychiatric: Mood and affect are agitated, somewhat elevated, and the patient is able to tell me her name and that she wishes to sit up, but not able to otherwise state orientation. Speech and behavior are normal.  ____________________________________________   LABS (all labs ordered are listed, but only abnormal results are displayed)  Labs Reviewed  CBC - Abnormal; Notable for the following:       Result Value   WBC 17.0 (*)    All other components within normal limits  GLUCOSE, CAPILLARY - Abnormal; Notable for the following:    Glucose-Capillary 107 (*)    All other components within normal limits  COMPREHENSIVE METABOLIC PANEL  CK  TROPONIN I  SALICYLATE LEVEL    ACETAMINOPHEN LEVEL  URINE DRUG SCREEN, QUALITATIVE ([facility] ONLY)  CBG MONITORING, ED   ____________________________________________  EKG  Reviewed and interpreted by me at 1510 Heart rate 120 QRS 90 QTc 430 Reviewed and interpreted as sinus tachycardia, no evidence of acute ischemic change noted ____________________________________________  RADIOLOGY   ____________________________________________   PROCEDURES  Procedure(s) performed: None  Procedures  Critical Care performed: Yes, see critical care note(s)  CRITICAL CARE Performed by: Delman Kitten   Total critical care time: 35 minutes  Critical care time was exclusive of separately billable procedures and treating other patients.  Critical care  was necessary to treat or prevent imminent or life-threatening deterioration.  Critical care was time spent personally by me on the following activities: development of treatment plan with patient and/or surrogate as well as nursing, discussions with consultants, evaluation of patient's response to treatment, examination of patient, obtaining history from patient or surrogate, ordering and performing treatments and interventions, ordering and review of laboratory studies, ordering and review of radiographic studies, pulse oximetry and re-evaluation of patient's condition.  Patient notes significant agitation after possible witnessed seizure. She does not carry a history of seizure disorder, does carry a history of previous psychiatric disease however. There is also mention the patient was previously on a benzodiazepine, potentially causes could include drug abuse, overdose, withdrawal, new onset seizure, traumatic injury, or other acute metabolic or toxic abnormality to explain the presentation of possible seizure and agitation.  ----------------------------------------- 3:18 PM on 11/06/2015 -----------------------------------------  After second dose of Ativan, the patient is  calm, hemodynamics improving, we will continue to monitor her closely as we continue with her workup. Ongoing care assigned to Dr. Jimmye Norman will follow up on remaining tests, and reevaluation of the patient. At this point the etiology of the patient's presentation today is still unknown. Witnessed reports no history is suggest there was any obvious acute attempted overdose, or self injurious behavior statements. ____________________________________________   INITIAL IMPRESSION / ASSESSMENT AND PLAN / ED COURSE  Pertinent labs & imaging results that were available during my care of the patient were reviewed by me and considered in my medical decision making (see chart for details).    Clinical Course     ____________________________________________   FINAL CLINICAL IMPRESSION(S) / ED DIAGNOSES  Final diagnoses:  Altered mental status, unspecified altered mental status type  Seizure ([place])      NEW MEDICATIONS STARTED DURING THIS VISIT:  New Prescriptions   No medications on file     Note:  This document was prepared using Dragon voice recognition software and may include unintentional dictation errors.     Delman Kitten, MD 11/06/15 [phone]

## 2015-11-06 NOTE — ED Provider Notes (Signed)
Patient likely had a seizure from benzodiazepine withdrawal. She does admit now to not having taking her medicines in several weeks. She has been very stable. We will continue to observe her.   Earleen Newport, MD 11/06/15 [phone]

## 2015-11-06 NOTE — ED Notes (Signed)
Pt. Was able to stand up from stretcher.  Pt. Unable to ambulate around room due to soreness of rt. Leg.

## 2015-11-06 NOTE — ED Notes (Addendum)
Pt taken to and from CT by this RN and ED Tech. No adverse events occurred. Pt calm, eyes closed, RR even and unlabored at this time.

## 2015-11-11 ENCOUNTER — Inpatient Hospital Stay (HOSPITAL_COMMUNITY)
Admission: EM | Admit: 2015-11-11 | Discharge: 2015-11-13 | DRG: 880 | Disposition: A | Discharge status: Home / Self Care | Payer: Self-pay | Attending: Family Medicine | Admitting: Family Medicine

## 2015-11-11 ENCOUNTER — Encounter (HOSPITAL_COMMUNITY): Payer: Self-pay

## 2015-11-11 DIAGNOSIS — G43909 Migraine, unspecified, not intractable, without status migrainosus: Secondary | ICD-10-CM | POA: Diagnosis present

## 2015-11-11 DIAGNOSIS — R7989 Other specified abnormal findings of blood chemistry: Secondary | ICD-10-CM | POA: Diagnosis present

## 2015-11-11 DIAGNOSIS — F332 Major depressive disorder, recurrent severe without psychotic features: Secondary | ICD-10-CM | POA: Diagnosis present

## 2015-11-11 DIAGNOSIS — F445 Conversion disorder with seizures or convulsions: Principal | ICD-10-CM | POA: Diagnosis present

## 2015-11-11 DIAGNOSIS — R252 Cramp and spasm: Secondary | ICD-10-CM | POA: Diagnosis present

## 2015-11-11 DIAGNOSIS — F3181 Bipolar II disorder: Secondary | ICD-10-CM | POA: Diagnosis present

## 2015-11-11 DIAGNOSIS — E872 Acidosis: Secondary | ICD-10-CM

## 2015-11-11 DIAGNOSIS — R6889 Other general symptoms and signs: Secondary | ICD-10-CM

## 2015-11-11 DIAGNOSIS — Z79899 Other long term (current) drug therapy: Secondary | ICD-10-CM

## 2015-11-11 DIAGNOSIS — R569 Unspecified convulsions: Secondary | ICD-10-CM

## 2015-11-11 DIAGNOSIS — I10 Essential (primary) hypertension: Secondary | ICD-10-CM | POA: Diagnosis present

## 2015-11-11 DIAGNOSIS — G43009 Migraine without aura, not intractable, without status migrainosus: Secondary | ICD-10-CM

## 2015-11-11 DIAGNOSIS — Z7952 Long term (current) use of systemic steroids: Secondary | ICD-10-CM

## 2015-11-11 DIAGNOSIS — F419 Anxiety disorder, unspecified: Secondary | ICD-10-CM | POA: Diagnosis present

## 2015-11-11 DIAGNOSIS — Z87891 Personal history of nicotine dependence: Secondary | ICD-10-CM

## 2015-11-11 DIAGNOSIS — IMO0001 Reserved for inherently not codable concepts without codable children: Secondary | ICD-10-CM

## 2015-11-11 LAB — CBC WITH DIFFERENTIAL/PLATELET
Basophils Absolute: 0 10*3/uL (ref 0.0–0.1)
Basophils Relative: 0 %
EOS ABS: 0.2 10*3/uL (ref 0.0–0.7)
Eosinophils Relative: 2 %
HEMATOCRIT: 37.3 % (ref 36.0–46.0)
HEMOGLOBIN: 12.3 g/dL (ref 12.0–15.0)
LYMPHS ABS: 2.5 10*3/uL (ref 0.7–4.0)
Lymphocytes Relative: 26 %
MCH: 29.6 pg (ref 26.0–34.0)
MCHC: 33 g/dL (ref 30.0–36.0)
MCV: 89.7 fL (ref 78.0–100.0)
MONOS PCT: 8 %
Monocytes Absolute: 0.8 10*3/uL (ref 0.1–1.0)
NEUTROS ABS: 6.4 10*3/uL (ref 1.7–7.7)
NEUTROS PCT: 64 %
Platelets: 351 10*3/uL (ref 150–400)
RBC: 4.16 MIL/uL (ref 3.87–5.11)
RDW: 13.7 % (ref 11.5–15.5)
WBC: 10 10*3/uL (ref 4.0–10.5)

## 2015-11-11 LAB — COMPREHENSIVE METABOLIC PANEL
ALBUMIN: 3.5 g/dL (ref 3.5–5.0)
ALK PHOS: 121 U/L (ref 38–126)
ALT: 27 U/L (ref 14–54)
AST: 25 U/L (ref 15–41)
Anion gap: 7 (ref 5–15)
BILIRUBIN TOTAL: 0.7 mg/dL (ref 0.3–1.2)
BUN: 11 mg/dL (ref 6–20)
CALCIUM: 9.3 mg/dL (ref 8.9–10.3)
CO2: 28 mmol/L (ref 22–32)
CREATININE: 0.71 mg/dL (ref 0.44–1.00)
Chloride: 105 mmol/L (ref 101–111)
GFR calc Af Amer: >60 mL/min (ref >60)
GFR calc non Af Amer: >60 mL/min (ref >60)
GLUCOSE: 94 mg/dL (ref 65–99)
Potassium: 3.9 mmol/L (ref 3.5–5.1)
SODIUM: 140 mmol/L (ref 135–145)
TOTAL PROTEIN: 6.9 g/dL (ref 6.5–8.1)

## 2015-11-11 LAB — RAPID URINE DRUG SCREEN, HOSP PERFORMED
AMPHETAMINES: NOT DETECTED
Amphetamines: NOT DETECTED
BARBITURATES: NOT DETECTED
BARBITURATES: NOT DETECTED
BENZODIAZEPINES: NOT DETECTED
Benzodiazepines: NOT DETECTED
COCAINE: NOT DETECTED
Cocaine: NOT DETECTED
Opiates: NOT DETECTED
Opiates: NOT DETECTED
TETRAHYDROCANNABINOL: NOT DETECTED
Tetrahydrocannabinol: NOT DETECTED

## 2015-11-11 LAB — MAGNESIUM: Magnesium: 1.8 mg/dL (ref 1.7–2.4)

## 2015-11-11 LAB — I-STAT CG4 LACTIC ACID, ED
Lactic Acid, Venous: 2.25 mmol/L (ref 0.5–1.9)
Lactic Acid, Venous: 0.83 mmol/L (ref 0.5–1.9)

## 2015-11-11 LAB — HCG, QUANTITATIVE, PREGNANCY: hCG, Beta Chain, Quant, S: 4 m[IU]/mL (ref <5)

## 2015-11-11 MED ORDER — LORAZEPAM 2 MG/ML IJ SOLN
1.0000 mg | Freq: Once | INTRAMUSCULAR | Status: AC
  Administered 2015-11-11: 1 mg via INTRAVENOUS
  Filled 2015-11-11: qty 1

## 2015-11-11 MED ORDER — SODIUM CHLORIDE 0.9 % IV SOLN: 1000.0000 mg | Freq: Once | INTRAVENOUS | Status: DC

## 2015-11-11 MED ORDER — ONDANSETRON HCL 4 MG/2ML IJ SOLN: 4.0000 mg | Freq: Four times a day (QID) | INTRAMUSCULAR | Status: DC | PRN

## 2015-11-11 MED ORDER — SODIUM CHLORIDE 0.9 % IV SOLN
INTRAVENOUS | Status: DC
  Administered 2015-11-12: via INTRAVENOUS

## 2015-11-11 MED ORDER — HYDRALAZINE HCL 20 MG/ML IJ SOLN: 5.0000 mg | INTRAMUSCULAR | Status: DC | PRN

## 2015-11-11 MED ORDER — SODIUM CHLORIDE 0.9% FLUSH
3.0000 mL | Freq: Two times a day (BID) | INTRAVENOUS | Status: DC
  Administered 2015-11-13: 3 mL via INTRAVENOUS

## 2015-11-11 MED ORDER — ONDANSETRON HCL 4 MG PO TABS
4.0000 mg | ORAL_TABLET | Freq: Four times a day (QID) | ORAL | Status: DC | PRN
Start: 2015-11-11 — End: 2015-11-13

## 2015-11-11 MED ORDER — ENOXAPARIN SODIUM 40 MG/0.4ML ~~LOC~~ SOLN
40.0000 mg | Freq: Every day | SUBCUTANEOUS | Status: DC
  Administered 2015-11-11 – 2015-11-12 (×2): 40 mg via SUBCUTANEOUS
  Filled 2015-11-11 (×3): qty 0.4

## 2015-11-11 MED ORDER — HYDROXYZINE HCL 25 MG PO TABS
50.0000 mg | ORAL_TABLET | ORAL | Status: DC | PRN
Start: 2015-11-11 — End: 2015-11-13
  Administered 2015-11-12: 50 mg via ORAL
  Filled 2015-11-11: qty 2

## 2015-11-11 MED ORDER — AMLODIPINE BESYLATE 5 MG PO TABS
5.0000 mg | ORAL_TABLET | Freq: Every day | ORAL | Status: DC
  Administered 2015-11-12: 5 mg via ORAL
  Filled 2015-11-11 (×2): qty 1

## 2015-11-11 MED ORDER — SODIUM CHLORIDE 0.9 % IV BOLUS (SEPSIS)
1000.0000 mL | Freq: Once | INTRAVENOUS | Status: AC
  Administered 2015-11-11: 1000 mL via INTRAVENOUS

## 2015-11-11 MED ORDER — OXCARBAZEPINE 150 MG PO TABS
150.0000 mg | ORAL_TABLET | Freq: Two times a day (BID) | ORAL | Status: DC
  Administered 2015-11-12: 150 mg via ORAL
  Filled 2015-11-11: qty 1

## 2015-11-11 MED ORDER — LORAZEPAM 0.5 MG PO TABS: 0.5000 mg | ORAL_TABLET | Freq: Three times a day (TID) | ORAL | Status: DC | PRN

## 2015-11-11 MED ORDER — TRAZODONE HCL 100 MG PO TABS: 100.0000 mg | ORAL_TABLET | Freq: Every evening | ORAL | Status: DC | PRN

## 2015-11-11 MED ORDER — TOPIRAMATE 100 MG PO TABS
200.0000 mg | ORAL_TABLET | Freq: Every day | ORAL | Status: DC
  Administered 2015-11-11 – 2015-11-12 (×2): 200 mg via ORAL
  Filled 2015-11-11 (×2): qty 2

## 2015-11-11 MED ORDER — ACETAMINOPHEN 325 MG PO TABS
650.0000 mg | ORAL_TABLET | Freq: Four times a day (QID) | ORAL | Status: DC | PRN
  Administered 2015-11-11 – 2015-11-13 (×3): 650 mg via ORAL
  Filled 2015-11-11 (×4): qty 2

## 2015-11-11 MED ORDER — ACETAMINOPHEN 650 MG RE SUPP: 650.0000 mg | Freq: Four times a day (QID) | RECTAL | Status: DC | PRN

## 2015-11-11 NOTE — ED Notes (Signed)
Pt resting comfortably with family at bedside. VSS. Pt alert and oriented and able to make needs known.Pt appears to be more calm at this time.

## 2015-11-11 NOTE — ED Notes (Signed)
MD at bedside. 

## 2015-11-11 NOTE — ED Provider Notes (Signed)
[facility] DEPT Provider Note   CSN: [id number] Arrival date & time: 11/11/15  1820     History   Chief Complaint Chief Complaint  Patient presents with  . Seizures    HPI [name] is a 50 y.o. female.  HPI 50 yo F with PMHx of HTN, depression, anxiety who presents with seizure-like episodes. Pt was just seen on 9/2 for similar sx, at which time lab work and imaging was unremarkable. She states that since 9/2, she has had several episodes in which she may or may not go entirely unresponsive. Family states that pt has been witnessed to have generalized shaking activity, and husband states pt was not responsive for at least one of these episodes, and had lateral tongue biting. Pt has also had episodes where she remains conscious and shakes her arms and legs but is able to respond. She has been under increased stress recently, and was also taken off benzos and topamax lately. No recent head trauma.  Past Medical History:  Diagnosis Date  . Anxiety   . Depression   . Hypertension   . Rosacea     Patient Active Problem List   Diagnosis Date Noted  . Jerking movements of extremities 11/11/2015  . Elevated lactic acid level 11/11/2015  . Major depressive disorder, recurrent severe without psychotic features ([place])   . Bipolar II disorder ([place]) 08/30/2015  . Suicide attempt by drug ingestion ([place]) 08/30/2015  . Suicidal ideation 08/30/2015  . Migraines 09/29/2014  . Migraine 05/19/2013  . Left-sided weakness 05/18/2013  . Numbness and tingling of left arm and leg 05/18/2013  . CVA (cerebral infarction) 05/18/2013  . Hypertension   . Anxiety     Past Surgical History:  Procedure Laterality Date  . ABDOMINAL HYSTERECTOMY     non cancerous  . APPENDECTOMY    . appendectomy    . BACK SURGERY    . CHOLECYSTECTOMY    . CHOLECYSTECTOMY    . lumbar     ruptured disc in lumbar taken out  . ROTATOR CUFF REPAIR Left   . TONSILLECTOMY      OB History    Gravida Para Term  Preterm AB Living             2   SAB TAB Ectopic Multiple Live Births                   Home Medications    Prior to Admission medications   Medication Sig Start Date End Date Taking? Authorizing Provider  hydrOXYzine (ATARAX/VISTARIL) 50 MG tablet Take 1 tablet (50 mg total) by mouth every 4 (four) hours as needed for itching. 09/03/15  Yes Jolanta B Pucilowska, MD  LORazepam (ATIVAN) 0.5 MG tablet Take 1 tablet (0.5 mg total) by mouth every 8 (eight) hours as needed for anxiety. 11/06/15 11/05/16 Yes Earleen Newport, MD  OXcarbazepine (TRILEPTAL) 150 MG tablet Take 1 tablet (150 mg total) by mouth 2 (two) times daily with a meal. 09/03/15  Yes Jolanta B Pucilowska, MD  traZODone (DESYREL) 100 MG tablet Take 1 tablet (100 mg total) by mouth at bedtime. Patient taking differently: Take 100 mg by mouth at bedtime as needed for sleep.  09/03/15  Yes Jolanta B Pucilowska, MD  amLODipine (NORVASC) 5 MG tablet Take 1 tablet (5 mg total) by mouth daily. Patient not taking: Reported on 11/11/2015 09/03/15   Clovis Fredrickson, MD  dexamethasone (DECADRON) 4 MG tablet Take 1 tablet (4 mg total) by mouth  2 (two) times daily with a meal. Patient not taking: Reported on 11/11/2015 10/08/15   Lily Kocher, PA-C  fluvoxaMINE (LUVOX) 100 MG tablet Take 1 tablet (100 mg total) by mouth at bedtime. Patient not taking: Reported on 11/11/2015 09/03/15   Clovis Fredrickson, MD  topiramate (TOPAMAX) 100 MG tablet Take 2 tablets (200 mg total) by mouth at bedtime. Patient not taking: Reported on 11/11/2015 09/03/15   Clovis Fredrickson, MD    Family History Family History  Problem Relation Age of Onset  . Diabetes Father   . Heart disease Father 31  . Hypertension Father   . Cancer Maternal Grandfather     colon cancer (70's)  . Diabetes Paternal Grandmother   . Diabetes Paternal Grandfather   . Cancer Cousin     breast    Social History Social History  Substance Use Topics  . Smoking status: Former  Research scientist (life sciences)  . Smokeless tobacco: Never Used  . Alcohol use No     Comment: occassionally     Allergies   Penicillins   Review of Systems Review of Systems  Constitutional: Positive for fatigue. Negative for chills and fever.  HENT: Negative for congestion and rhinorrhea.   Eyes: Negative for visual disturbance.  Respiratory: Negative for cough, shortness of breath and wheezing.   Cardiovascular: Negative for chest pain and leg swelling.  Gastrointestinal: Negative for abdominal pain, diarrhea, nausea and vomiting.  Genitourinary: Negative for dysuria and flank pain.  Musculoskeletal: Negative for neck pain and neck stiffness.  Skin: Negative for rash and wound.  Allergic/Immunologic: Negative for immunocompromised state.  Neurological: Positive for seizures and headaches. Negative for syncope and weakness.  All other systems reviewed and are negative.    Physical Exam Updated Vital Signs BP 100/75 (BP Location: Right Arm)   Pulse 89   Temp 98.4 F (36.9 C) (Oral)   Resp 18   Ht 5\' 7"  (1.702 m)   Wt 204 lb 8 oz (92.8 kg)   SpO2 98%   BMI 32.03 kg/m   Physical Exam  Constitutional: She is oriented to person, place, and time. She appears well-developed and well-nourished. No distress.  HENT:  Head: Normocephalic and atraumatic.  Eyes: Conjunctivae are normal.  Neck: Neck supple.  Cardiovascular: Normal rate, regular rhythm and normal heart sounds.  Exam reveals no friction rub.   No murmur heard. Pulmonary/Chest: Effort normal and breath sounds normal. No respiratory distress. She has no wheezes. She has no rales.  Abdominal: She exhibits no distension.  Musculoskeletal: She exhibits no edema.  Neurological: She is alert and oriented to person, place, and time. She exhibits normal muscle tone.  Skin: Skin is warm. Capillary refill takes less than 2 seconds.  Psychiatric: She has a normal mood and affect.  Nursing note and vitals reviewed.   Neurological Exam:    Mental Status: Alert and oriented to person, place, and time. Attention and concentration normal. Speech clear. Recent memory is intact. Cranial Nerves: Visual fields intact to confrontation in all quadrants bilaterally. EOMI and PERRLA. No nystagmus noted. Facial sensation intact at forehead, maxillary cheek, and chin/mandible bilaterally. No weakness of masticatory muscles. No facial asymmetry or weakness. Hearing grossly normal to finer rub. Uvula is midline, and palate elevates symmetrically. Normal SCM and trapezius strength. Tongue midline without fasciculations Motor: Muscle strength 5/5 in proximal and distal UE and LE bilaterally. No pronator drift. Muscle tone normal. Reflexes: 2+ and symmetrical in all four extremities.  Sensation: Intact to light touch in  upper and lower extremities distally bilaterally.  Gait: Normal without ataxia. Coordination: Normal FTN bilaterally.     ED Treatments / Results  Labs (all labs ordered are listed, but only abnormal results are displayed) Labs Reviewed  I-STAT CG4 LACTIC ACID, ED - Abnormal; Notable for the following:       Result Value   Lactic Acid, Venous 2.25 (*)    All other components within normal limits  CBC WITH DIFFERENTIAL/PLATELET  COMPREHENSIVE METABOLIC PANEL  MAGNESIUM  URINE RAPID DRUG SCREEN, HOSP PERFORMED  HCG, QUANTITATIVE, PREGNANCY  URINE RAPID DRUG SCREEN, HOSP PERFORMED  BASIC METABOLIC PANEL  CBC  I-STAT CG4 LACTIC ACID, ED    EKG  EKG Interpretation  Date/Time:  Thursday November 11 2015 20:04:40 EDT Ventricular Rate:  104 PR Interval:    QRS Duration: 85 QT Interval:  317 QTC Calculation: 417 R Axis:   57 Text Interpretation:  Sinus tachycardia Abnormal R-wave progression, early transition No significant change since last tracing Confirmed by Mekhi Sonn MD, Lysbeth Galas [phone]) on 11/12/2015 2:35:17 AM       Radiology No results found.  Procedures Procedures (including critical care  time)  Medications Ordered in ED Medications  LORazepam (ATIVAN) tablet 0.5 mg (not administered)  hydrOXYzine (ATARAX/VISTARIL) tablet 50 mg (not administered)  Oxcarbazepine (TRILEPTAL) tablet 150 mg (not administered)  topiramate (TOPAMAX) tablet 200 mg (200 mg Oral Given 11/11/15 2352)  traZODone (DESYREL) tablet 100 mg (not administered)  0.9 %  sodium chloride infusion ( Intravenous New Bag/Given 11/12/15 0012)  enoxaparin (LOVENOX) injection 40 mg (40 mg Subcutaneous Given 11/11/15 2353)  sodium chloride flush (NS) 0.9 % injection 3 mL (3 mLs Intravenous Not Given 11/11/15 2215)  acetaminophen (TYLENOL) tablet 650 mg (650 mg Oral Given 11/11/15 2352)    Or  acetaminophen (TYLENOL) suppository 650 mg ( Rectal See Alternative 11/11/15 2352)  ondansetron (ZOFRAN) tablet 4 mg (not administered)    Or  ondansetron (ZOFRAN) injection 4 mg (not administered)  hydrALAZINE (APRESOLINE) injection 5 mg (not administered)  amLODipine (NORVASC) tablet 5 mg (not administered)  docusate sodium (COLACE) capsule 200 mg (200 mg Oral Given 11/12/15 0109)  sodium chloride 0.9 % bolus 1,000 mL (1,000 mLs Intravenous New Bag/Given 11/11/15 2002)  LORazepam (ATIVAN) injection 1 mg (1 mg Intravenous Given 11/11/15 2000)  sodium chloride 0.9 % bolus 1,000 mL (1,000 mLs Intravenous New Bag/Given 11/11/15 2237)     Initial Impression / Assessment and Plan / ED Course  I have reviewed the triage vital signs and the nursing notes.  Pertinent labs & imaging results that were available during my care of the patient were reviewed by me and considered in my medical decision making (see chart for details).  Clinical Course    50 yo F with PMHx of depression/anxiety who p/w multiple seizure-like episodes. On arrival, VSS and WNL. Recent work-up and CT head negative on 11/06/2015. 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 Of note, however, husband also adamant pt has episodes of GTC  activity with tongue biting, and she does have risk factors for possible benzo versus AED w/d seizures. No focal neuro deficits, no recent head trauma to indicate need for repeat imaging at this time. Discussed with neurology Dr. Nicole Kindred. Lab work is unremarkable. Will plan to admit for EEG, further characterization. Family is in agreement. EKG unremarkable, labs o/w acceptable.  Final Clinical Impressions(s) / ED Diagnoses   Final diagnoses:  Spells [facility])    New Prescriptions Current  Discharge Medication List       Duffy Bruce, MD 11/12/15 [phone]

## 2015-11-11 NOTE — Progress Notes (Addendum)
Patient arrived to unit with family at bedside. Vitals stable, tele applied. Questions answered. Bed rail pads, oxygen and suction set up for seizure precautions. Will continue to monitor.

## 2015-11-11 NOTE — ED Notes (Signed)
This RN assisted pt to restroom and back to bed.

## 2015-11-11 NOTE — Consult Note (Signed)
Admission H&P    Chief Complaint: Possible new onset seizure disorder  HPI: [name] is an 50 y.o. female with history of hypertension, migraine headaches, depression and anxiety, brought to the emergency room for evaluation of possible new onset seizure disorder. Patient had an episode reportedly of loss of consciousness with jerking of extremities and biting of the tongue on 11/06/2015. She was seen at [facility] ED. Workup including CT scan of the head was unremarkable. She was not admitted overnight. She had a recurrent episode of apparent loss of consciousness this afternoon witnessed by family members, including her mother. Patient was noted to have seizure-like activity in route to the hospital as well as after arriving in the ED. She reportedly had jerking movements but was still able to make eye contact when spoken to. CT scan of her head was not repeated. She was admitted for evaluation to rule out seizure disorder as well as possible pseudoseizures.  Past Medical History:  Diagnosis Date  . Anxiety   . Depression   . Hypertension   . Rosacea     Past Surgical History:  Procedure Laterality Date  . ABDOMINAL HYSTERECTOMY     non cancerous  . APPENDECTOMY    . appendectomy    . BACK SURGERY    . CHOLECYSTECTOMY    . CHOLECYSTECTOMY    . lumbar     ruptured disc in lumbar taken out  . ROTATOR CUFF REPAIR Left   . TONSILLECTOMY      Family History  Problem Relation Age of Onset  . Diabetes Father   . Heart disease Father 83  . Hypertension Father   . Cancer Maternal Grandfather     colon cancer (70's)  . Diabetes Paternal Grandmother   . Diabetes Paternal Grandfather   . Cancer Cousin     breast   Social History:  reports that she has quit smoking. She has never used smokeless tobacco. She reports that she does not drink alcohol or use drugs.  Allergies:  Allergies  Allergen Reactions  . Penicillins Hives    Has patient had a PCN reaction causing immediate rash,  facial/tongue/throat swelling, SOB or lightheadedness with hypotension: Yes Has patient had a PCN reaction causing severe rash involving mucus membranes or skin necrosis: No Has patient had a PCN reaction that required hospitalization No Has patient had a PCN reaction occurring within the last 10 years: No If all of the above answers are "NO", then may proceed with Cephalosporin use.      Medications Prior to Admission  Medication Sig Dispense Refill  . hydrOXYzine (ATARAX/VISTARIL) 50 MG tablet Take 1 tablet (50 mg total) by mouth every 4 (four) hours as needed for itching. 90 tablet 0  . LORazepam (ATIVAN) 0.5 MG tablet Take 1 tablet (0.5 mg total) by mouth every 8 (eight) hours as needed for anxiety. 20 tablet 0  . OXcarbazepine (TRILEPTAL) 150 MG tablet Take 1 tablet (150 mg total) by mouth 2 (two) times daily with a meal. 60 tablet 0  . traZODone (DESYREL) 100 MG tablet Take 1 tablet (100 mg total) by mouth at bedtime. (Patient taking differently: Take 100 mg by mouth at bedtime as needed for sleep. ) 30 tablet 0  . amLODipine (NORVASC) 5 MG tablet Take 1 tablet (5 mg total) by mouth daily. (Patient not taking: Reported on 11/11/2015) 30 tablet 0  . dexamethasone (DECADRON) 4 MG tablet Take 1 tablet (4 mg total) by mouth 2 (two) times daily with a  meal. (Patient not taking: Reported on 11/11/2015) 12 tablet 0  . fluvoxaMINE (LUVOX) 100 MG tablet Take 1 tablet (100 mg total) by mouth at bedtime. (Patient not taking: Reported on 11/11/2015) 30 tablet 0  . topiramate (TOPAMAX) 100 MG tablet Take 2 tablets (200 mg total) by mouth at bedtime. (Patient not taking: Reported on 11/11/2015) 60 tablet 0    ROS: History obtained from the patient  General ROS: negative for - chills, fatigue, fever, night sweats, weight gain or weight loss Psychological ROS: negative for - behavioral disorder, hallucinations, memory difficulties, mood swings or suicidal ideation Ophthalmic ROS: negative for - blurry  vision, double vision, eye pain or loss of vision ENT ROS: negative for - epistaxis, nasal discharge, oral lesions, sore throat, tinnitus or vertigo Allergy and Immunology ROS: negative for - hives or itchy/watery eyes Hematological and Lymphatic ROS: negative for - bleeding problems, bruising or swollen lymph nodes Endocrine ROS: negative for - galactorrhea, hair pattern changes, polydipsia/polyuria or temperature intolerance Respiratory ROS: negative for - cough, hemoptysis, shortness of breath or wheezing Cardiovascular ROS: negative for - chest pain, dyspnea on exertion, edema or irregular heartbeat Gastrointestinal ROS: negative for - abdominal pain, diarrhea, hematemesis, nausea/vomiting or stool incontinence Genito-Urinary ROS: negative for - dysuria, hematuria, incontinence or urinary frequency/urgency Musculoskeletal ROS: negative for - joint swelling or muscular weakness Neurological ROS: as noted in HPI Dermatological ROS: negative for rash and skin lesion changes  Physical Examination: Blood pressure 119/70, pulse 104, temperature 98.9 F (37.2 C), temperature source Oral, resp. rate 18, weight 86.2 kg (190 lb), SpO2 98 %.  HEENT-  Normocephalic, no lesions, without obvious abnormality.  Normal external eye and conjunctiva.  Normal TM's bilaterally.  Normal auditory canals and external ears. Normal external nose, mucus membranes and septum.  Normal pharynx. Neck supple with no masses, nodes, nodules or enlargement. Cardiovascular - regular rate and rhythm, S1, S2 normal, no murmur, click, rub or gallop Lungs - chest clear, no wheezing, rales, normal symmetric air entry Abdomen - soft, non-tender; bowel sounds normal; no masses,  no organomegaly Extremities - no joint deformities, effusion, or inflammation and no edema  Neurologic Examination: Mental Status: Alert, oriented, complaining of headache.  Speech fluent without evidence of aphasia. Able to follow commands without  difficulty. Cranial Nerves: II-Visual fields were normal. III/IV/VI-Pupils were equal and reacted normally to light. Extraocular movements were full and conjugate.    V/VII-no facial numbness and no facial weakness. VIII-normal. X-normal speech and symmetrical palatal movement. XI: trapezius strength/neck flexion strength normal bilaterally XII-midline tongue extension with normal strength. Motor: 5/5 bilaterally with normal tone and bulk Sensory: Normal throughout. Deep Tendon Reflexes: 2+ and symmetric. Plantars: Mute bilaterally Cerebellar: Normal finger-to-nose testing. Carotid auscultation: Normal  Results for orders placed or performed during the hospital encounter of 11/11/15 (from the past 48 hour(s))  CBC with Differential     Status: None   Collection Time: 11/11/15  6:38 PM  Result Value Ref Range   WBC 10.0 4.0 - 10.5 K/uL   RBC 4.16 3.87 - 5.11 MIL/uL   Hemoglobin 12.3 12.0 - 15.0 g/dL   HCT 37.3 36.0 - 46.0 %   MCV 89.7 78.0 - 100.0 fL   MCH 29.6 26.0 - 34.0 pg   MCHC 33.0 30.0 - 36.0 g/dL   RDW 13.7 11.5 - 15.5 %   Platelets 351 150 - 400 K/uL   Neutrophils Relative % 64 %   Neutro Abs 6.4 1.7 - 7.7 K/uL   Lymphocytes  Relative 26 %   Lymphs Abs 2.5 0.7 - 4.0 K/uL   Monocytes Relative 8 %   Monocytes Absolute 0.8 0.1 - 1.0 K/uL   Eosinophils Relative 2 %   Eosinophils Absolute 0.2 0.0 - 0.7 K/uL   Basophils Relative 0 %   Basophils Absolute 0.0 0.0 - 0.1 K/uL  Comprehensive metabolic panel     Status: None   Collection Time: 11/11/15  6:38 PM  Result Value Ref Range   Sodium 140 135 - 145 mmol/L   Potassium 3.9 3.5 - 5.1 mmol/L   Chloride 105 101 - 111 mmol/L   CO2 28 22 - 32 mmol/L   Glucose, Bld 94 65 - 99 mg/dL   BUN 11 6 - 20 mg/dL   Creatinine, Ser 0.71 0.44 - 1.00 mg/dL   Calcium 9.3 8.9 - 10.3 mg/dL   Total Protein 6.9 6.5 - 8.1 g/dL   Albumin 3.5 3.5 - 5.0 g/dL   AST 25 15 - 41 U/L   ALT 27 14 - 54 U/L   Alkaline Phosphatase 121 38 - 126 U/L    Total Bilirubin 0.7 0.3 - 1.2 mg/dL   GFR calc non Af Amer >60 >60 mL/min   GFR calc Af Amer >60 >60 mL/min    Comment: (NOTE) The eGFR has been calculated using the CKD EPI equation. This calculation has not been validated in all clinical situations. eGFR's persistently <60 mL/min signify possible Chronic Kidney Disease.    Anion gap 7 5 - 15  Magnesium     Status: None   Collection Time: 11/11/15  6:38 PM  Result Value Ref Range   Magnesium 1.8 1.7 - 2.4 mg/dL  hCG, quantitative, pregnancy     Status: None   Collection Time: 11/11/15  7:20 PM  Result Value Ref Range   hCG, Beta Chain, Quant, S 4 <5 mIU/mL    Comment:          GEST. AGE      CONC.  (mIU/mL)   <=1 WEEK        5 - 50     2 WEEKS       50 - 500     3 WEEKS       100 - 10,000     4 WEEKS     1,000 - 30,000     5 WEEKS     3,500 - 115,000   6-8 WEEKS     12,000 - 270,000    12 WEEKS     15,000 - 220,000        FEMALE AND NON-PREGNANT FEMALE:     LESS THAN 5 mIU/mL   I-Stat CG4 Lactic Acid, ED     Status: Abnormal   Collection Time: 11/11/15  7:26 PM  Result Value Ref Range   Lactic Acid, Venous 2.25 (HH) 0.5 - 1.9 mmol/L   Comment NOTIFIED PHYSICIAN   Rapid urine drug screen (hospital performed)     Status: None   Collection Time: 11/11/15  9:41 PM  Result Value Ref Range   Opiates NONE DETECTED NONE DETECTED   Cocaine NONE DETECTED NONE DETECTED   Benzodiazepines NONE DETECTED NONE DETECTED   Amphetamines NONE DETECTED NONE DETECTED   Tetrahydrocannabinol NONE DETECTED NONE DETECTED   Barbiturates NONE DETECTED NONE DETECTED    Comment:        DRUG SCREEN FOR MEDICAL PURPOSES ONLY.  IF CONFIRMATION IS NEEDED FOR ANY PURPOSE, NOTIFY LAB WITHIN 5 DAYS.  LOWEST DETECTABLE LIMITS FOR URINE DRUG SCREEN Drug Class       Cutoff (ng/mL) Amphetamine      1000 Barbiturate      200 Benzodiazepine   662 Tricyclics       947 Opiates          300 Cocaine          300 THC              50   I-Stat  CG4 Lactic Acid, ED     Status: None   Collection Time: 11/11/15 10:07 PM  Result Value Ref Range   Lactic Acid, Venous 0.83 0.5 - 1.9 mmol/L   No results found.  Assessment/Plan 50 year old lady presenting with seizure like activity, being admitted rule out new onset seizure disorder versus pseudoseizures. Clinical examination was unremarkable with no focal abnormalities. CT scan of her head 2 days ago was unremarkable.  Recommendations: 1. MRI of the brain without and with contrast 2. EEG routine study initially but may need long-term video EEG recording 3. No medication unless patient has equivocal clinical seizure, or EEG shows findings indicating increased risk for seizure occurrence  We will continue to follow this patient with you.  C.R. Nicole Kindred, MD [facility]  11/11/2015, 11:10 PM

## 2015-11-11 NOTE — H&P (Signed)
History and Physical    [name] [id number] DOB: 1966/02/10 DOA: 11/11/2015  Referring MD/NP/PA:   PCP: Odette Fraction, MD   Patient coming from:  The patient is coming from home.  At baseline, pt is independent for most of ADL.      Chief Complaint: jerking   HPI: [name] is a 50 y.o. female with medical history significant of hypertension, migraine headaches, bipolar, depression, anxiety, Rosacea, who presents with a jerking movement.  Patient had an episode jerking of extremities 11/06/2015, with LOC and urinary incontinence. She was seen at [facility] ED. CT scan of the head was unremarkable. Pt had another episode of jerking movement with LOC this afternoon witnessed by family members, including her mother. No incontinence. No unilateral weakness, numbness or tingling sensation in extremities. No vision change or hearing loss. Pt has multiple bruises in legs. Patient has headache, no chest pain, shortness of breath, nausea, vomiting, diarrhea, abdominal pain, symptoms of UTI. No fever or chills.  ED Course: pt was found to have W 16.0, ligated at 2.25, temperature normal, tachycardia, oxygen saturation 100%, electrolytes renal function okay. Patient is placed on telemetry bed for observation. Neurology, Dr. Nicole Kindred was consulted.  Review of Systems:   General: no fevers, chills, no changes in body weight, has poor appetite, has fatigue and HA HEENT: no blurry vision, hearing changes or sore throat Respiratory: no dyspnea, coughing, wheezing CV: no chest pain, no palpitations GI: no nausea, vomiting, abdominal pain, diarrhea, constipation GU: no dysuria, burning on urination, increased urinary frequency, hematuria  Ext: no leg edema Neuro: no unilateral weakness, numbness, or tingling, no vision change or hearing loss. Has jerking movements. Skin: no rash, no skin tear. Has multiple bruises in legs. MSK: No muscle spasm, no deformity, no limitation of range of movement in  spin Heme: No easy bruising.  Travel history: No recent long distant travel.  Allergy:  Allergies  Allergen Reactions  . Penicillins Hives    Has patient had a PCN reaction causing immediate rash, facial/tongue/throat swelling, SOB or lightheadedness with hypotension: Yes Has patient had a PCN reaction causing severe rash involving mucus membranes or skin necrosis: No Has patient had a PCN reaction that required hospitalization No Has patient had a PCN reaction occurring within the last 10 years: No If all of the above answers are "NO", then may proceed with Cephalosporin use.      Past Medical History:  Diagnosis Date  . Anxiety   . Depression   . Hypertension   . Rosacea     Past Surgical History:  Procedure Laterality Date  . ABDOMINAL HYSTERECTOMY     non cancerous  . APPENDECTOMY    . appendectomy    . BACK SURGERY    . CHOLECYSTECTOMY    . CHOLECYSTECTOMY    . lumbar     ruptured disc in lumbar taken out  . ROTATOR CUFF REPAIR Left   . TONSILLECTOMY      Social History:  reports that she has quit smoking. She has never used smokeless tobacco. She reports that she does not drink alcohol or use drugs.  Family History:  Family History  Problem Relation Age of Onset  . Diabetes Father   . Heart disease Father 53  . Hypertension Father   . Cancer Maternal Grandfather     colon cancer (70's)  . Diabetes Paternal Grandmother   . Diabetes Paternal Grandfather   . Cancer Cousin     breast  Prior to Admission medications   Medication Sig Start Date End Date Taking? Authorizing Provider  hydrOXYzine (ATARAX/VISTARIL) 50 MG tablet Take 1 tablet (50 mg total) by mouth every 4 (four) hours as needed for itching. 09/03/15  Yes Jolanta B Pucilowska, MD  LORazepam (ATIVAN) 0.5 MG tablet Take 1 tablet (0.5 mg total) by mouth every 8 (eight) hours as needed for anxiety. 11/06/15 11/05/16 Yes Earleen Newport, MD  OXcarbazepine (TRILEPTAL) 150 MG tablet Take 1 tablet  (150 mg total) by mouth 2 (two) times daily with a meal. 09/03/15  Yes Jolanta B Pucilowska, MD  traZODone (DESYREL) 100 MG tablet Take 1 tablet (100 mg total) by mouth at bedtime. Patient taking differently: Take 100 mg by mouth at bedtime as needed for sleep.  09/03/15  Yes Jolanta B Pucilowska, MD  amLODipine (NORVASC) 5 MG tablet Take 1 tablet (5 mg total) by mouth daily. Patient not taking: Reported on 11/11/2015 09/03/15   Clovis Fredrickson, MD  dexamethasone (DECADRON) 4 MG tablet Take 1 tablet (4 mg total) by mouth 2 (two) times daily with a meal. Patient not taking: Reported on 11/11/2015 10/08/15   Lily Kocher, PA-C  fluvoxaMINE (LUVOX) 100 MG tablet Take 1 tablet (100 mg total) by mouth at bedtime. Patient not taking: Reported on 11/11/2015 09/03/15   Clovis Fredrickson, MD  topiramate (TOPAMAX) 100 MG tablet Take 2 tablets (200 mg total) by mouth at bedtime. Patient not taking: Reported on 11/11/2015 09/03/15   Clovis Fredrickson, MD    Physical Exam: Vitals:   11/11/15 2145 11/11/15 2307 11/12/15 0033 11/12/15 0523  BP:  115/71 100/75 126/86  Pulse: 104 97 89 87  Resp: 18 18 18 16   Temp:  98.6 F (37 C) 98.4 F (36.9 C) 98.3 F (36.8 C)  TempSrc:  Oral Oral Oral  SpO2: 98% 98% 98% 99%  Weight:  92.8 kg (204 lb 8 oz)    Height:  5\' 7"  (1.702 m)     General: Not in acute distress HEENT:       Eyes: PERRL, EOMI, no scleral icterus.       ENT: No discharge from the ears and nose, no pharynx injection, no tonsillar enlargement.        Neck: No JVD, no bruit, no mass felt. Heme: No neck lymph node enlargement. Cardiac: S1/S2, RRR, No murmurs, No gallops or rubs. Respiratory: No rales, wheezing, rhonchi or rubs. GI: Soft, nondistended, nontender, no rebound pain, no organomegaly, BS present. GU: No hematuria Ext: No pitting leg edema bilaterally. 2+DP/PT pulse bilaterally. Musculoskeletal: No joint deformities, No joint redness or warmth, no limitation of ROM in spin. Skin: No  rashes. Has multiple bruits in legs. Neuro: Alert, oriented X3, cranial nerves II-XII grossly intact, moves all extremities normally. Muscle strength 5/5 in all extremities, sensation to light touch intact. Brachial reflex 2+ bilaterally. Knee reflex 1+ bilaterally. Negative Babinski's sign. Normal finger to nose test. Psych: Patient is not psychotic, no suicidal or hemocidal ideation.  Labs on Admission: I have personally reviewed following labs and imaging studies  CBC:  Recent Labs Lab 11/06/15 1500 11/11/15 1838  WBC 17.0* 10.0  NEUTROABS  --  6.4  HGB 13.7 12.3  HCT 39.8 37.3  MCV 86.4 89.7  PLT 344 [id number]   Basic Metabolic Panel:  Recent Labs Lab 11/06/15 1500 11/11/15 1838  NA 138 140  K 4.0 3.9  CL 106 105  CO2 20* 28  GLUCOSE 105* 94  BUN 13 11  CREATININE 0.67 0.71  CALCIUM 8.9 9.3  MG  --  1.8   GFR: Estimated Creatinine Clearance: 100.6 mL/min (by C-G formula based on SCr of 0.8 mg/dL). Liver Function Tests:  Recent Labs Lab 11/06/15 1500 11/11/15 1838  AST 43* 25  ALT 59* 27  ALKPHOS 156* 121  BILITOT 0.2* 0.7  PROT 7.7 6.9  ALBUMIN 4.1 3.5   No results for input(s): LIPASE, AMYLASE in the last 168 hours. No results for input(s): AMMONIA in the last 168 hours. Coagulation Profile: No results for input(s): INR, PROTIME in the last 168 hours. Cardiac Enzymes:  Recent Labs Lab 11/06/15 1500  CKTOTAL 100  TROPONINI <0.03   BNP (last 3 results) No results for input(s): PROBNP in the last 8760 hours. HbA1C: No results for input(s): HGBA1C in the last 72 hours. CBG:  Recent Labs Lab 11/06/15 1507 11/12/15 0654  GLUCAP 107* 86   Lipid Profile: No results for input(s): CHOL, HDL, LDLCALC, TRIG, CHOLHDL, LDLDIRECT in the last 72 hours. Thyroid Function Tests: No results for input(s): TSH, T4TOTAL, FREET4, T3FREE, THYROIDAB in the last 72 hours. Anemia Panel: No results for input(s): VITAMINB12, FOLATE, FERRITIN, TIBC, IRON, RETICCTPCT in  the last 72 hours. Urine analysis:    Component Value Date/Time   COLORURINE YELLOW 05/18/2013 1600   APPEARANCEUR CLEAR 05/18/2013 1600   LABSPEC <1.005 (L) 05/18/2013 1600   PHURINE 6.5 05/18/2013 1600   GLUCOSEU NEGATIVE 05/18/2013 1600   HGBUR TRACE (A) 05/18/2013 1600   BILIRUBINUR NEGATIVE 05/18/2013 1600   KETONESUR NEGATIVE 05/18/2013 1600   PROTEINUR NEGATIVE 05/18/2013 1600   UROBILINOGEN 0.2 05/18/2013 1600   NITRITE NEGATIVE 05/18/2013 1600   LEUKOCYTESUR NEGATIVE 05/18/2013 1600   Sepsis Labs: @LABRCNTIP (procalcitonin:4,lacticidven:4) )No results found for this or any previous visit (from the past 240 hour(s)).   Radiological Exams on Admission: No results found.   EKG: Independently reviewed.  Sinus rhythm, QTC 417, T-wave inversion only in lead 2, early R-wave progression.   Assessment/Plan Principal Problem:   Jerking movements of extremities Active Problems:   Hypertension   Anxiety   Migraine   Bipolar II disorder (HCC)   Major depressive disorder, recurrent severe without psychotic features (HCC)   Elevated lactic acid level   Jerking movements of extremities: Etiology is not clear. CT scan of her head 2 days ago was unremarkable. Neurology, Dr. Nicole Kindred was consulted-->concerning for seizure disorder versus pseudoseizures. He recommended MRI of brain and EEG. Per Dr. Nicole Kindred, no medication unless patient has equivocal clinical seizure, or EEG shows findings indicating increased risk for seizure occurrence  -will place on tele bed for obs -seizure precaution -prn Ativan -MRi- brain without and with contrast -EEG routine study initially but may need long-term video EEG recording per Dr. Nicole Kindred  HTN: bp 154/82 -Continue amlodipine -IV hydralazine when necessary  Anxiety and depression -prn ativan and  Luvox  Migraine:  -on Trileptal and topmax  Elevated lactic acid level: likely due to jerking movement. No sign of infection. -IVF: 2l NS,  then 100 cc/h -trend lactic acid   DVT ppx: SQ Lovenox Code Status: Full code Family Communication: Yes, patient's   Mother and boyfriend  at bed side Disposition Plan:  Anticipate discharge back to previous home environment Consults called:  Neurology, Dr. Nicole Kindred Admission status: Obs / tele  Date of Service 11/12/2015    Ivor Costa [facility] Pager [phone]  If 7PM-7AM, please contact night-coverage [email or web address] Password [email or web address] 11/12/2015, 7:19 AM

## 2015-11-11 NOTE — ED Triage Notes (Signed)
Pt brought in via EMS with c/o "seizure-like activity" witnessed by family at her mother's house. Pt was at [facility] and d/c for the same complaint last Saturday. Pt denies any hx of seizures prior to Saturdays event. Pt has bruising to BLE, deep bruising to left upper thigh and right lower leg. Pt reports these bruises are from the seizure on Saturday in which she does "not remember any additional details". Pt alert and oriented and cooperative at this time. Skin warm, pink, dry. Pt reports generalized pain r/t bruising. Denies LOC.

## 2015-11-11 NOTE — ED Notes (Signed)
Pt presents with jerking movement; however, pt able to sit still and make eye contact when engaging in conversation.

## 2015-11-12 ENCOUNTER — Observation Stay (HOSPITAL_COMMUNITY)
Admit: 2015-11-12 | Discharge: 2015-11-12 | Disposition: A | Discharge status: Home / Self Care | Payer: Self-pay | Attending: Neurology | Admitting: Neurology

## 2015-11-12 ENCOUNTER — Observation Stay (HOSPITAL_COMMUNITY): Payer: Self-pay

## 2015-11-12 ENCOUNTER — Ambulatory Visit: Payer: Managed Care, Other (non HMO) | Admitting: Family Medicine

## 2015-11-12 DIAGNOSIS — F319 Bipolar disorder, unspecified: Secondary | ICD-10-CM

## 2015-11-12 DIAGNOSIS — R258 Other abnormal involuntary movements: Secondary | ICD-10-CM

## 2015-11-12 LAB — CBC
HCT: 31.4 % — ABNORMAL LOW (ref 36.0–46.0)
Hemoglobin: 10.2 g/dL — ABNORMAL LOW (ref 12.0–15.0)
MCH: 29.4 pg (ref 26.0–34.0)
MCHC: 32.5 g/dL (ref 30.0–36.0)
MCV: 90.5 fL (ref 78.0–100.0)
PLATELETS: 254 10*3/uL (ref 150–400)
RBC: 3.47 MIL/uL — AB (ref 3.87–5.11)
RDW: 14 % (ref 11.5–15.5)
WBC: 7 10*3/uL (ref 4.0–10.5)

## 2015-11-12 LAB — BASIC METABOLIC PANEL
Anion gap: 5 (ref 5–15)
BUN: 7 mg/dL (ref 6–20)
CHLORIDE: 112 mmol/L — AB (ref 101–111)
CO2: 24 mmol/L (ref 22–32)
CREATININE: 0.58 mg/dL (ref 0.44–1.00)
Calcium: 8.2 mg/dL — ABNORMAL LOW (ref 8.9–10.3)
GFR calc non Af Amer: >60 mL/min (ref >60)
GLUCOSE: 90 mg/dL (ref 65–99)
Potassium: 3.5 mmol/L (ref 3.5–5.1)
SODIUM: 141 mmol/L (ref 135–145)

## 2015-11-12 LAB — GLUCOSE, CAPILLARY: Glucose-Capillary: 86 mg/dL (ref 65–99)

## 2015-11-12 MED ORDER — DOCUSATE SODIUM 100 MG PO CAPS
200.0000 mg | ORAL_CAPSULE | Freq: Every day | ORAL | Status: DC
  Administered 2015-11-12 (×2): 200 mg via ORAL
  Filled 2015-11-12 (×2): qty 2

## 2015-11-12 MED ORDER — OXCARBAZEPINE 150 MG PO TABS
150.0000 mg | ORAL_TABLET | Freq: Two times a day (BID) | ORAL | Status: DC
Start: 2015-11-12 — End: 2015-11-13
  Administered 2015-11-12 – 2015-11-13 (×3): 150 mg via ORAL
  Filled 2015-11-12 (×3): qty 1

## 2015-11-12 MED ORDER — IBUPROFEN 200 MG PO TABS
800.0000 mg | ORAL_TABLET | Freq: Once | ORAL | Status: AC
Start: 2015-11-12 — End: 2015-11-12
  Administered 2015-11-12: 800 mg via ORAL
  Filled 2015-11-12: qty 4

## 2015-11-12 NOTE — Progress Notes (Signed)
Patient complaining of headache 10/10, did not want to take tylenol. Said it did not help. MD paged to notify.

## 2015-11-12 NOTE — Progress Notes (Signed)
PROGRESS NOTE  [name]  [id number] DOB: 07-30-1965 DOA: 11/11/2015 PCP: Odette Fraction, MD  Outpatient Specialists: [facility]  Brief Narrative: [name] is a 50 y.o. female with medical history significant of HTN, migraines, bipolar disorder with suicide attempt by overdose, who prsented for episodic jerking of her extremities. She describes multiple episodes of tremor-like movement and spasm of her legs and involving her arms worsening for the past 5 days. She reported that she loses consciousness for some, but not all, of these episodes. Family that were present report that she makes eye contact and will respond verbally during episodes. No urinary incontinence, no focal neurological deficits, and no significant drowsiness afterward. Initial CT scan of the head was unremarkable, WBC 16.0, lactate 2.25, afebrile, UDS neg, electrolytes renal function okay. Follow up labs normalized completely. She was placed in telemetry for observation with continuous EEG. MRI ordered, but will not occur until 9/9.   Assessment & Plan: Principal Problem:   Jerking movements of extremities Active Problems:   Hypertension   Anxiety   Migraine   Bipolar II disorder (HCC)   Major depressive disorder, recurrent severe without psychotic features (HCC)   Elevated lactic acid level  Jerking movements of extremities: Concern for seizures vs. pseudoseizures. Pt prescribed topamax and trileptal for bipolar disorder, though pt has been out of topamax.  - Continue EEG and MRI - Neurology consultation appreciated - NO abortive seizure medications without unequivocal seizure.  - Seizure precautions  HTN: Chronic, stable. Controlled on home medications here.  -Continue amlodipine 5mg ; hydralazine prn  Bipolar disorder: Mood stable currently.   - D/C ativan prn during EEG - Continue trileptal and topamax starting this evening.  - Continue trazodone qHS prn  Migraine:  - Continue trileptal and  topmax - ibuprofen prn   Elevated lactic acid level: resolved. No indication of infection.  DVT prophylaxis: Lovenox Code Status: Full Family Communication: Discussed with daughter and boyfriend Disposition Plan: Pending EEG results and MRI 9/9. Can D/C return to home if negative tomorrow.   Consultants:   NeuroHospitalists, Dr. Leonel Ramsay  Procedures:   EEG in process  Antimicrobials:  None   Subjective: Pt reports 2 - 3 jerking episodes since this morning, corroborated by her daughter who reports she had no LOC, and was verbally responsive during episodes, mostly leg shaking and spasm with pain afterward.   Objective: Vitals:   11/12/15 0523 11/12/15 0800 11/12/15 0950 11/12/15 1500  BP: 126/86 101/72 101/64 101/66  Pulse: 87 72 73 74  Resp: 16 18 18 18   Temp: 98.3 F (36.8 C) 97.6 F (36.4 C) 98.6 F (37 C) 98.5 F (36.9 C)  TempSrc: Oral Axillary Oral Oral  SpO2: 99%  99% 99%  Weight:      Height:        Intake/Output Summary (Last 24 hours) at 11/12/15 1722 Last data filed at 11/12/15 0400  Gross per 24 hour  Intake              380 ml  Output                0 ml  Net              380 ml   Filed Weights   11/11/15 1828 11/11/15 2307  Weight: 86.2 kg (190 lb) 92.8 kg (204 lb 8 oz)    Examination: General exam: 50 y.o. female in no distress with EEG leads in place Respiratory system: Non-labored breathing. Clear to  auscultation bilaterally.  Cardiovascular system: Regular rate and rhythm. No murmur, rub, or gallop. No JVD, and no pedal edema. Gastrointestinal system: Abdomen soft, non-tender, non-distended, with normoactive bowel sounds. No organomegaly or masses felt. Central nervous system: Alert and oriented. No focal neurological deficits. Extremities: Warm, no deformities Skin: Large bruise on left lateral thigh with signs of routine resolution. No open wounds.  Psychiatry: Judgement and insight appear normal. Mood & affect appropriate.   Data  Reviewed: I have personally reviewed following labs and imaging studies  CBC:  Recent Labs Lab 11/06/15 1500 11/11/15 1838 11/12/15 0616  WBC 17.0* 10.0 7.0  NEUTROABS  --  6.4  --   HGB 13.7 12.3 10.2*  HCT 39.8 37.3 31.4*  MCV 86.4 89.7 90.5  PLT 344 351 0000000   Basic Metabolic Panel:  Recent Labs Lab 11/06/15 1500 11/11/15 1838 11/12/15 0616  NA 138 140 141  K 4.0 3.9 3.5  CL 106 105 112*  CO2 20* 28 24  GLUCOSE 105* 94 90  BUN 13 11 7   CREATININE 0.67 0.71 0.58  CALCIUM 8.9 9.3 8.2*  MG  --  1.8  --    GFR: Estimated Creatinine Clearance: 100.6 mL/min (by C-G formula based on SCr of 0.8 mg/dL). Liver Function Tests:  Recent Labs Lab 11/06/15 1500 11/11/15 1838  AST 43* 25  ALT 59* 27  ALKPHOS 156* 121  BILITOT 0.2* 0.7  PROT 7.7 6.9  ALBUMIN 4.1 3.5   No results for input(s): LIPASE, AMYLASE in the last 168 hours. No results for input(s): AMMONIA in the last 168 hours. Coagulation Profile: No results for input(s): INR, PROTIME in the last 168 hours. Cardiac Enzymes:  Recent Labs Lab 11/06/15 1500  CKTOTAL 100  TROPONINI <0.03   BNP (last 3 results) No results for input(s): PROBNP in the last 8760 hours. HbA1C: No results for input(s): HGBA1C in the last 72 hours. CBG:  Recent Labs Lab 11/06/15 1507 11/12/15 0654  GLUCAP 107* 86   Lipid Profile: No results for input(s): CHOL, HDL, LDLCALC, TRIG, CHOLHDL, LDLDIRECT in the last 72 hours. Thyroid Function Tests: No results for input(s): TSH, T4TOTAL, FREET4, T3FREE, THYROIDAB in the last 72 hours. Anemia Panel: No results for input(s): VITAMINB12, FOLATE, FERRITIN, TIBC, IRON, RETICCTPCT in the last 72 hours. Urine analysis:    Component Value Date/Time   COLORURINE YELLOW 05/18/2013 1600   APPEARANCEUR CLEAR 05/18/2013 1600   LABSPEC <1.005 (L) 05/18/2013 1600   PHURINE 6.5 05/18/2013 1600   GLUCOSEU NEGATIVE 05/18/2013 1600   HGBUR TRACE (A) 05/18/2013 1600   BILIRUBINUR NEGATIVE  05/18/2013 1600   KETONESUR NEGATIVE 05/18/2013 1600   PROTEINUR NEGATIVE 05/18/2013 1600   UROBILINOGEN 0.2 05/18/2013 1600   NITRITE NEGATIVE 05/18/2013 1600   LEUKOCYTESUR NEGATIVE 05/18/2013 1600   Sepsis Labs: @LABRCNTIP (procalcitonin:4,lacticidven:4)  )No results found for this or any previous visit (from the past 240 hour(s)).   Radiology Studies: No results found.  Scheduled Meds: . amLODipine  5 mg Oral Daily  . docusate sodium  200 mg Oral QHS  . enoxaparin (LOVENOX) injection  40 mg Subcutaneous QHS  . OXcarbazepine  150 mg Oral BID WC  . sodium chloride flush  3 mL Intravenous Q12H  . topiramate  200 mg Oral QHS   Continuous Infusions: . sodium chloride 100 mL/hr at 11/12/15 0012     LOS: 0 days   Time spent: 25 minutes.  Vance Gather, MD [facility] Pager [phone]  If 7PM-7AM, please contact night-coverage [email or web address]  Password [email or web address] 11/12/2015, 5:22 PM

## 2015-11-12 NOTE — Progress Notes (Signed)
Text page sent to on call regarding med for constipation, LBM 9/3. Tylene Fantasia, NP new orders placed daily colace at bedtime. Medication to be given once verified by pharmacy. Will continue to monitor patient.

## 2015-11-12 NOTE — Progress Notes (Signed)
LTM day 1 started; Dr Candiss Norse notified. Leads were glued on and all under 5 kohms. Pt's husband and nurse tech were notified of the event button. Advised can only use bed pan or bedside commode w/assistance during LTM.

## 2015-11-12 NOTE — Progress Notes (Signed)
Subjective: Complains of left sided weakness.   Exam: Vitals:   11/12/15 0950 11/12/15 1500  BP: 101/64 101/66  Pulse: 73 74  Resp: 18 18  Temp: 98.6 F (37 C) 98.5 F (36.9 C)   Gen: In bed, NAD Resp: non-labored breathing, no acute distress Abd: soft, nt  Neuro: MS: awake, alert.  [id number], splits midline to vibration on forehead.  Motor: MAEW, has inconsistent left sided weakness, initally with no drift, but when tested subsequently, has mild drift without pronation, 4/5 left leg.  Sensory:decreased throughout left side.   Pertinent Labs: Bmp - unremarkable  Impression: 50 yo F with recurrent spells following being let go from her job. I strongly suspect non-organic etiology and her exam fits with this diagnosis. IT would be helpful to capture a spell, but I am very hesitant to start AEDs at this time.   Recommendations: 1) Overnight EEG to capture spell 2) will follow.   Roland Rack, MD [facility] [phone]  If 7pm- 7am, please page neurology on call as listed in [place].

## 2015-11-12 NOTE — Progress Notes (Signed)
Pt having convulsions but is still able to talk and follow instructions during episode. Convulsions lasted 2 minutes. Pt complaining of itching after episode. RN will continue to monitor.

## 2015-11-12 NOTE — Progress Notes (Addendum)
Nutrition Risk Brief Note  Pt identified due to positive malnutrition screening tool (MST).  Filed Weights   11/11/15 1828 11/11/15 2307  Weight: 190 lb (86.2 kg) 204 lb 8 oz (92.8 kg)   Pt reports that she usually weighs between 190-200 #. RD and dietetic intern spoke with pt at bedside. When asked about decreased appetite, pt reports consuming 10-15% less than usual for her about 1 month. Pt reports recent weight loss due to decreased appetite in combination with making efforts to lose weight. No significant weight loss per weight hx.  Pt is on a Heart Healthy diet an is consuming approximately 75-100% of meals. Encouraged adequate, healthful PO intake.  NFPE: exam revealed no muscle depletion, no fat depletion, and no evidence of edema.  Jeb Levering Dietetic Intern Pager Number: [phone]

## 2015-11-13 ENCOUNTER — Inpatient Hospital Stay (HOSPITAL_COMMUNITY): Payer: Self-pay

## 2015-11-13 DIAGNOSIS — F445 Conversion disorder with seizures or convulsions: Secondary | ICD-10-CM | POA: Diagnosis present

## 2015-11-13 LAB — GLUCOSE, CAPILLARY: Glucose-Capillary: 86 mg/dL (ref 65–99)

## 2015-11-13 MED ORDER — TOPIRAMATE 100 MG PO TABS: 200.0000 mg | ORAL_TABLET | Freq: Every day | ORAL | 0 refills | Status: DC

## 2015-11-13 MED ORDER — OXCARBAZEPINE 150 MG PO TABS: 150.0000 mg | ORAL_TABLET | Freq: Two times a day (BID) | ORAL | 0 refills | Status: DC

## 2015-11-13 MED ORDER — GADOBENATE DIMEGLUMINE 529 MG/ML IV SOLN
20.0000 mL | Freq: Once | INTRAVENOUS | Status: AC | PRN
  Administered 2015-11-13: 20 mL via INTRAVENOUS

## 2015-11-13 MED ORDER — FLUVOXAMINE MALEATE 100 MG PO TABS: 100.0000 mg | ORAL_TABLET | Freq: Every day | ORAL | 0 refills | Status: DC

## 2015-11-13 MED ORDER — HYDROCODONE-ACETAMINOPHEN 5-325 MG PO TABS
1.0000 | ORAL_TABLET | Freq: Once | ORAL | Status: AC
  Administered 2015-11-13: 1 via ORAL
  Filled 2015-11-13: qty 1

## 2015-11-13 MED ORDER — FLUVOXAMINE MALEATE 100 MG PO TABS
100.0000 mg | ORAL_TABLET | Freq: Every day | ORAL | Status: DC
  Filled 2015-11-13: qty 1

## 2015-11-13 NOTE — Clinical Social Work Note (Signed)
CSW provided the patient with outpatient psychiatry resources per MD's request. The patient was appreciative and verbalized that she had no questions.  Liz Beach MSW, [place], [place], [id number]

## 2015-11-13 NOTE — Progress Notes (Signed)
Patient discharged home with family via wheelchair. IV and tele d/c'd. VSS. Pt c/o pain in head and leg and was medicated via orders (see MAR). Discharge instructions and medications were reviewed and given. All questions answered. Pt and family both verbalized understanding. All belongings gathered and taken.

## 2015-11-13 NOTE — Procedures (Signed)
Electroencephalogram report- LTM  Ordering Physician : Dr. Nicole Kindred EEG number:     Beginning date or time: 11/12/2015 2:17PM Ending date or time: 11/13/2015 07:30AM  Day of study: day 1  Medications include: Per EMR  MENTAL STATUS (per technician's notes): Alert, Oriented.  HISTORY: This 24 hours of intensive EEG monitoring with simultaneous video monitoring was performed for this patient with seizure like episodes. This EEG was requested to characterize the episodes to guide medical management.  TECHNICAL DESCRIPTION:  The study consists of a continuous 16-channel multi-montage digital video EEG recording with twenty-one electrodes placed according to the International 10-20 System. Additional leads included eye leads, true temporal leads (T1, T2), and an EKG lead. Activation procedures were not done due to mental status.  REPORT: The background activity in this tracing consisted of 9Hz  posterior dominant rhythm. The activity is symmetric bilaterally and reactive to stimulation. Drowsiness and sleep were manifested by background fragmentation, vertex waves and sleep spindles. No interictal focal slowing or epileptiform activity was seen.  Multiple push buttons events were seen during the recording (4:38PM, 4:48PM, 6:16PM, 9:45PM, 9:52AM, 5:46AM). On video, patient had asymmetric variable frequency shaking of the upper limbs, large body movements that start and stop.  Electro-graphically, there was movement and electrode artifact during the shaking. Normal awake background was seen in between the artifact. No postictal slowing was seen. No epileptiform activity was seen.  IMPRESSION: This EEG shows: No focal or paroxysmal activity  CLINICAL CORRELATION: Based on the video EEG recording, the patient's events are non-epileptic in nature.

## 2015-11-13 NOTE — Progress Notes (Signed)
Subjective: Had multiple spells overnight on recording. These are the same spells which prompted this hospitalization. The event last Saturday was different then the current spells.   Exam: Vitals:   11/13/15 0443 11/13/15 1026  BP: 105/82 132/81  Pulse: 80 82  Resp: 18 18  Temp: 97.8 F (36.6 C) 97.9 F (36.6 C)   Gen: In bed, NAD Resp: non-labored breathing, no acute distress Abd: soft, nt  Neuro: MS: awake, alert [id number], EOMI Motor: left sided inconsistent weakness. Downward drift without pronation.  Sensory:decreased on left.   EEG report reviewed: non-epileptic spells.   Labs: Bmp - unremarkable  Impression: 50 yo F with new onset spells/left sided weakness. The spells prompting this admission are psychogenic in nature. It is not clear to me that the spell last Saturday was a similar event, but if it was a seizure, it would be a single unprovoked event. She is also on topamax and trileptal for migraine and mood stabilization respectively. I would not change these medications.   I suspect her left sided weakness is psychogenic as well, but will need MRI.   Recommendations: 1) MRI brain 2) If negative, then no further workup or changes in management would be recommended.   Roland Rack, MD [facility] [phone]  If 7pm- 7am, please page neurology on call as listed in [place].

## 2015-11-13 NOTE — Discharge Summary (Signed)
Physician Discharge Summary  [name] [id number] DOB: Aug 03, 1965 DOA: 11/11/2015  PCP: Odette Fraction, MD  Admit date: 11/11/2015 Discharge date: 11/13/2015  Admitted From: Home Disposition: Home   Recommendations for Outpatient Follow-up:  1. Follow up with PCP in 1-2 weeks 2. Follow up with psychotherapy for underlying stressors.   Home Health: None Equipment/Devices: None  Discharge Condition: Stable CODE STATUS: Full Diet recommendation: No restrictions  Brief/Interim Summary: [name] a 50 y.o.femalewith medical history significant of HTN, migraines, bipolar disorder with suicide attempt by overdose, who prsented for episodic jerking of her extremities. She describes multiple episodes of tremor-like movement and spasm of her legs and involving her arms worsening for the past 5 days. She reported that she loses consciousness for some, but not all, of these episodes. Family that were present report a single episode 5 days PTA with LOC and urinary incontinence followed by drowsiness. They later found bruising on her legs. For other episodes family reports that she makes eye contact and will respond verbally during episodes. No urinary incontinence, no focal neurological deficits, and no significant drowsiness afterward. She was fired from her job on the day of admission. Initial CT scan of the head was unremarkable, WBC 16.0, lactate 2.25, afebrile, UDS neg, electrolytes renal function okay. Follow up labs normalized completely. She was placed in telemetry for observation with continuous EEG monitoring. Continuous EEG performed with multiple episodes captured showed no epileptiform activity and no interictal or postictal waveforms. This indicates that this activity is not true seizure activity. MRI brain was performed 9/9 and showed no significant abnormality. The initial episode reported by family with LOC sounds distinct from other episodes and more suggestive of seizure activity,  so it would be reasonable to treat this as single unprovoked seizure. She will be discharged on home medications including topamax and trileptal (initially prescribed for mood disorder and migraine adjunctive treatment).   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 CT scan of the head showed no acute abnormalities and subsequent MRI of the brain similarly showed nothing that could cause these problems. The test for seizures, the continuous EEG, was recording several of these episodes and showed no evidence of seizures. These episodes are not seizure activity. You are cautioned to return for medical care if symptoms worsen or you experience focal weakness, facial droop, or slurred speech. Please continue all medications and follow up with your healthcare providers for ongoing care.       Medication List    STOP taking these medications   dexamethasone 4 MG tablet Commonly known as:  DECADRON     TAKE these medications   amLODipine 5 MG tablet Commonly known as:  NORVASC Take 1 tablet (5 mg total) by mouth daily.   fluvoxaMINE 100 MG tablet Commonly known as:  LUVOX Take 1 tablet (100 mg total) by mouth at bedtime.   hydrOXYzine 50 MG tablet Commonly known as:  ATARAX/VISTARIL Take 1 tablet (50 mg total) by mouth every 4 (four) hours as needed for  itching.   LORazepam 0.5 MG tablet Commonly known as:  ATIVAN Take 1 tablet (0.5 mg total) by mouth every 8 (eight) hours as needed for anxiety.   OXcarbazepine 150 MG  tablet Commonly known as:  TRILEPTAL Take 1 tablet (150 mg total) by mouth 2 (two) times daily with a meal.   topiramate 100 MG tablet Commonly known as:  TOPAMAX Take 2 tablets (200 mg total) by mouth at bedtime.   traZODone 100 MG tablet Commonly known as:  DESYREL Take 1 tablet (100 mg total) by mouth at bedtime. What changed:  when to take this  reasons to take this      [place], MD. Call in 4 week(s).   Specialty:  Family Medicine Contact information: [id number] [place] [address] [phone]          Allergies  Allergen Reactions  . Penicillins Hives    Has patient had a PCN reaction causing immediate rash, facial/tongue/throat swelling, SOB or lightheadedness with hypotension: Yes Has patient had a PCN reaction causing severe rash involving mucus membranes or skin necrosis: No Has patient had a PCN reaction that required hospitalization No Has patient had a PCN reaction occurring within the last 10 years: No If all of the above answers are "NO", then may proceed with Cephalosporin use.      Consultations:  Neurology, Dr. Leonel Ramsay  Procedures/Studies: Ct Head Wo Contrast  Result Date: 11/06/2015 CLINICAL DATA:  Seizure-like activity, confusion and disorientation. EXAM: CT HEAD WITHOUT CONTRAST TECHNIQUE: Contiguous axial images were obtained from the base of the skull through the vertex without intravenous contrast. COMPARISON:  None. FINDINGS: Brain: The brain demonstrates no evidence of hemorrhage, infarction, edema, mass effect, extra-axial fluid collection, hydrocephalus or mass lesion. The skull is unremarkable. Vascular: No hyperdense vessel or unexpected calcification. Skull: Normal appearance of the skull without evidence of fracture or lesion. Sinuses/Orbits: No acute finding. IMPRESSION: Normal head CT. Electronically Signed   By: Aletta Edouard M.D.   On: 11/06/2015 15:27   Mr Jeri Cos [id number]  Contrast  Result Date: 11/13/2015 CLINICAL DATA:  Jerking movements of the extremities. Possible seizure. Left-sided weakness. EXAM: MRI HEAD WITHOUT AND WITH CONTRAST TECHNIQUE: Multiplanar, multiecho pulse sequences of the brain and surrounding structures were obtained without and with intravenous contrast. CONTRAST:  52mL MULTIHANCE GADOBENATE DIMEGLUMINE 529 MG/ML IV SOLN COMPARISON:  MRI brain 05/19/2013 FINDINGS: The diffusion-weighted images demonstrate no evidence for acute or subacute infarction. No acute hemorrhage or mass lesion is present. There is no significant white matter disease or edema. The internal auditory canals are within normal limits bilaterally. The brainstem and cerebellum are normal. Flow is present in the major intracranial arteries. The globes and orbits are intact. The paranasal sinuses are clear. There is fluid in the right mastoid air cells. No obstructing nasopharyngeal lesion is evident. The postcontrast images demonstrate no pathologic enhancement. Skullbase is within normal limits. Midline sagittal structures are unremarkable. IMPRESSION: 1. Negative MRI the brain. 2. Right mastoid effusion. No obstructing nasopharyngeal lesion is present. Electronically Signed   By: San Morelle M.D.   On: 11/13/2015 15:33    EEG 11/12/2015 2:17pm - 11/13/2015 07:30am: REPORT: The background activity in this tracing consisted of 9Hz  posterior dominant rhythm. The activity is symmetric bilaterally and reactive to stimulation. Drowsiness and sleep were manifested by background fragmentation, vertex waves and sleep spindles. No interictal focal slowing or epileptiform activity was seen.  Multiple push buttons events  were seen during the recording (4:38PM, 4:48PM, 6:16PM, 9:45PM, 9:52AM, 5:46AM). On video, patient had asymmetric variable frequency shaking of the upper limbs, large body movements that start and stop.  Electro-graphically, there was movement and electrode artifact during  the shaking. Normal awake background was seen in between the artifact. No postictal slowing was seen. No epileptiform activity was seen.  IMPRESSION: This EEG shows: No focal or paroxysmal activity  CLINICAL CORRELATION: Based on the video EEG recording, the patient's events are non-epileptic in nature.  Subjective: She's got a headache, global, sharp, severe. Takes hydrocodone at home. No neck stiffness/pain or fever.   Discharge Exam: Vitals:   11/13/15 1026 11/13/15 1332  BP: 132/81 99/60  Pulse: 82 79  Resp: 18 18  Temp: 97.9 F (36.6 C) 98.1 F (36.7 C)   Vitals:   11/13/15 0042 11/13/15 0443 11/13/15 1026 11/13/15 1332  BP: 112/71 105/82 132/81 99/60  Pulse: 82 80 82 79  Resp: 18 18 18 18   Temp: 97.9 F (36.6 C) 97.8 F (36.6 C) 97.9 F (36.6 C) 98.1 F (36.7 C)  TempSrc: Oral Oral Oral   SpO2: 98% 100% 98% 99%  Weight:      Height:       General: Pt is alert, awake, not in acute distress HEENT: No tongue lac. Oropharynx clear.  Cardiovascular: RRR, S1/S2 +, no rubs, no gallops Respiratory: CTA bilaterally, no wheezing, no rhonchi Abdominal: Soft, NT, ND, bowel sounds + Extremities: Left lateral thigh with large (~15 in x 15in irregular) bruise. Right shin also with lighter diffuse bruising.  Neuro: Alert, oriented, normal speech, no focal deficits.  Psych: No HI or SI, does not appear to be responding to internal stimuli.   The results of significant diagnostics from this hospitalization (including imaging, microbiology, ancillary and laboratory) are listed below for reference.    Labs: Basic Metabolic Panel:  Recent Labs Lab 11/11/15 1838 11/12/15 0616  NA 140 141  K 3.9 3.5  CL 105 112*  CO2 28 24  GLUCOSE 94 90  BUN 11 7  CREATININE 0.71 0.58  CALCIUM 9.3 8.2*  MG 1.8  --    Liver Function Tests:  Recent Labs Lab 11/11/15 1838  AST 25  ALT 27  ALKPHOS 121  BILITOT 0.7  PROT 6.9  ALBUMIN 3.5   CBC:  Recent Labs Lab  11/11/15 1838 11/12/15 0616  WBC 10.0 7.0  NEUTROABS 6.4  --   HGB 12.3 10.2*  HCT 37.3 31.4*  MCV 89.7 90.5  PLT 351 254   CBG:  Recent Labs Lab 11/12/15 0654 11/13/15 0606  GLUCAP 86 86   Urinalysis    Component Value Date/Time   COLORURINE YELLOW 05/18/2013 1600   APPEARANCEUR CLEAR 05/18/2013 1600   LABSPEC <1.005 (L) 05/18/2013 1600   PHURINE 6.5 05/18/2013 1600   GLUCOSEU NEGATIVE 05/18/2013 1600   HGBUR TRACE (A) 05/18/2013 1600   BILIRUBINUR NEGATIVE 05/18/2013 1600   KETONESUR NEGATIVE 05/18/2013 1600   PROTEINUR NEGATIVE 05/18/2013 1600   UROBILINOGEN 0.2 05/18/2013 1600   NITRITE NEGATIVE 05/18/2013 1600   LEUKOCYTESUR NEGATIVE 05/18/2013 1600   Time coordinating discharge: Over 30 minutes  Vance Gather, MD  [facility] 11/13/2015, 3:53 PM Pager [phone]  If 7PM-7AM, please contact night-coverage [email or web address] Password [email or web address]

## 2015-11-13 NOTE — Progress Notes (Signed)
Pt had a total of 6 seizure like events during her MRI

## 2015-11-13 NOTE — Discharge Instructions (Addendum)
Continue taking ibuprofen as needed for headache and/or pain related to the bruising. Cold compresses will help the leg pain as well.   Please refer to the list of therapists provided to you by social work to establish a therapeutic relationship with a Social worker. This may help address underlying issues related to the seizure-like episodes.

## 2015-12-23 ENCOUNTER — Encounter: Payer: Self-pay | Admitting: Family Medicine

## 2015-12-23 ENCOUNTER — Ambulatory Visit (INDEPENDENT_AMBULATORY_CARE_PROVIDER_SITE_OTHER): Payer: Managed Care, Other (non HMO) | Admitting: Family Medicine

## 2015-12-23 VITALS — BP 112/80 | HR 76 | Temp 99.0°F | Resp 18 | Ht 66.5 in | Wt 198.0 lb

## 2015-12-23 DIAGNOSIS — R569 Unspecified convulsions: Secondary | ICD-10-CM

## 2015-12-23 DIAGNOSIS — F445 Conversion disorder with seizures or convulsions: Secondary | ICD-10-CM
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
## 2016-01-05 ENCOUNTER — Encounter (HOSPITAL_COMMUNITY): Payer: Self-pay

## 2016-01-05 ENCOUNTER — Emergency Department (HOSPITAL_COMMUNITY)
Admission: EM | Admit: 2016-01-05 | Discharge: 2016-01-05 | Disposition: A | Discharge status: Home / Self Care | Payer: Self-pay | Attending: Emergency Medicine | Admitting: Emergency Medicine

## 2016-01-05 DIAGNOSIS — Z79899 Other long term (current) drug therapy: Secondary | ICD-10-CM | POA: Insufficient documentation

## 2016-01-05 DIAGNOSIS — I10 Essential (primary) hypertension: Secondary | ICD-10-CM | POA: Insufficient documentation

## 2016-01-05 DIAGNOSIS — Z87891 Personal history of nicotine dependence: Secondary | ICD-10-CM | POA: Insufficient documentation

## 2016-01-05 DIAGNOSIS — B86 Scabies: Secondary | ICD-10-CM | POA: Insufficient documentation

## 2016-01-05 MED ORDER — PERMETHRIN 5 % EX CREA: TOPICAL_CREAM | CUTANEOUS | 0 refills | Status: DC

## 2016-01-05 NOTE — ED Triage Notes (Signed)
Itching all over x3 weeks. Has been exposed to scabies.

## 2016-01-05 NOTE — Discharge Instructions (Signed)
Please apply the permethrin cream to urinate entire body. Wash off after 8-14 hours. Then repeat in one week. You may continue to use her hydroxyzine for itchiness. He may also use over-the-counter hydrocortisone cream. Please place her bedding and airtight back for 72 hours. Then wash in hot water and dry clean. Please have those closest U the head, can contact to be treated. Please follow-up with her primary care doctor symptoms not improved. Return to the ED if symptoms worsen.

## 2016-01-06 NOTE — ED Provider Notes (Signed)
[facility] DEPT Provider Note   CSN: [id number] Arrival date & time: 01/05/16  1718     History   Chief Complaint Chief Complaint  Patient presents with  . Pruritis    HPI [name] is a 50 y.o. female.   Rash   This is a new problem. The current episode started more than 1 week ago. The problem has been gradually worsening. The problem is associated with an insect bite/sting (Has been exposed to scabies. Several members of famiily treated for scabies.). There has been no fever. The rash is present on the torso, back, abdomen, trunk, left arm, right fingers, left fingers and right arm. The pain is at a severity of 1/10. The patient is experiencing no pain. Associated symptoms include itching. She has tried antihistamines for the symptoms. The treatment provided mild relief. Risk factors include new environmental exposures.    Past Medical History:  Diagnosis Date  . Anxiety   . Depression   . Hypertension   . Rosacea     Patient Active Problem List   Diagnosis Date Noted  . 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 Jerking movements of extremities 11/11/2015  . Major depressive disorder, recurrent severe without psychotic features ([place])   . Bipolar II disorder ([place]) 08/30/2015  . Suicide attempt by drug ingestion ([place]) 08/30/2015  . Suicidal ideation 08/30/2015  . Migraines 09/29/2014  . Migraine 05/19/2013  . Left-sided weakness 05/18/2013  . Numbness and tingling of left arm and leg 05/18/2013  . CVA (cerebral infarction) 05/18/2013  . Hypertension   . Anxiety     Past Surgical History:  Procedure Laterality Date  . ABDOMINAL HYSTERECTOMY     non cancerous  . APPENDECTOMY    . appendectomy    . BACK SURGERY    . CHOLECYSTECTOMY    . CHOLECYSTECTOMY    . lumbar     ruptured disc in lumbar taken out  . ROTATOR CUFF REPAIR Left   . TONSILLECTOMY      OB History    Gravida Para Term Preterm AB Living             2   SAB TAB Ectopic Multiple Live Births            Home Medications    Prior to Admission medications   Medication Sig Start Date End Date Taking? Authorizing Provider  fluvoxaMINE (LUVOX) 100 MG tablet Take 1 tablet (100 mg total) by mouth at bedtime. 11/13/15   Patrecia Pour, MD  hydrOXYzine (ATARAX/VISTARIL) 50 MG tablet Take 1 tablet (50 mg total) by mouth every 4 (four) hours as needed for itching. 09/03/15   Clovis Fredrickson, MD  lacosamide (VIMPAT) 50 MG TABS tablet Take 50 mg by mouth 2 (two) times daily.    Historical Provider, MD  LORazepam (ATIVAN) 0.5 MG tablet Take 1 tablet (0.5 mg total) by mouth every 8 (eight) hours as needed for anxiety. 11/06/15 11/05/16  Earleen Newport, MD  permethrin (ELIMITE) 5 % cream Apply to affected area once 01/05/16   Doristine Devoid, PA-C  traZODone (DESYREL) 100 MG tablet Take 1 tablet (100 mg total) by mouth at bedtime. Patient taking differently: Take 100 mg by mouth at bedtime as needed for sleep.  09/03/15   Clovis Fredrickson, MD    Family History Family History  Problem Relation Age of Onset  . Diabetes Father   . Heart disease Father 70  . Hypertension Father   . Cancer Maternal Grandfather  colon cancer (70's)  . Diabetes Paternal Grandmother   . Diabetes Paternal Grandfather   . Cancer Cousin     breast    Social History Social History  Substance Use Topics  . Smoking status: Former Research scientist (life sciences)  . Smokeless tobacco: Never Used  . Alcohol use No     Comment: occassionally     Allergies   Lamotrigine and Penicillins   Review of Systems Review of Systems  Constitutional: Negative for chills and fever.  Eyes: Negative for itching.  Genitourinary: Negative.   Musculoskeletal: Negative for arthralgias.  Skin: Positive for color change, itching and rash.  Allergic/Immunologic: Negative for immunocompromised state.  All other systems reviewed and are negative.    Physical Exam Updated Vital Signs BP 132/69 (BP Location: Left Arm)   Pulse 80   Temp  97.7 F (36.5 C) (Oral)   Resp 16   Ht 5\' 7"  (1.702 m)   Wt 89.8 kg   SpO2 98%   BMI 31.01 kg/m   Physical Exam  Constitutional: She appears well-developed and well-nourished. No distress.  Eyes: Right eye exhibits no discharge. Left eye exhibits no discharge. No scleral icterus.  Pulmonary/Chest: No respiratory distress.  Musculoskeletal: Normal range of motion.  Neurological: She is alert.  Skin: Skin is warm and dry. Capillary refill takes less than 2 seconds. Rash noted. No pallor.  puritic erythematous papulovesicular lesion noted to arms, finger webs, torso, chest, back, and legs. Denis any genital lesions. Possibly burrow lines between the webs of the fingers.  Nursing note and vitals reviewed.    ED Treatments / Results  Labs (all labs ordered are listed, but only abnormal results are displayed) Labs Reviewed - No data to display  EKG  EKG Interpretation None       Radiology No results found.  Procedures Procedures (including critical care time)  Medications Ordered in ED Medications - No data to display   Initial Impression / Assessment and Plan / ED Course  I have reviewed the triage vital signs and the nursing notes.  Pertinent labs & imaging results that were available during my care of the patient were reviewed by me and considered in my medical decision making (see chart for details).  Clinical Course  Patient presents with pruritic rash consistent with scabies after exposure. Discussed diagnosis & treatment of scabies.  They have been advised to followup with her primary care doctor 2 weeks after treatment.  They have also been advised to clean entire household including washing sheets and using R.I.D. spray in the car and on sofa.   The use of permethrin cream was discussed as well, they were told to use cream from head to toe & leave on  8-12 hours then rinse. They've been advised to repeat treatment if new eruptions occur. Encouraged symptomatic  treatment for pruritis. Patient verbalized understanding.    Final Clinical Impressions(s) / ED Diagnoses   Final diagnoses:  Scabies    New Prescriptions Discharge Medication List as of 01/05/2016  7:29 PM    START taking these medications   Details  permethrin (ELIMITE) 5 % cream Apply to affected area once, Print         Doristine Devoid, PA-C 01/06/16 1821    Isla Pence, MD 01/06/16 2143

## 2016-01-28 ENCOUNTER — Encounter (HOSPITAL_COMMUNITY): Payer: Self-pay | Admitting: Emergency Medicine

## 2016-01-28 ENCOUNTER — Emergency Department (HOSPITAL_COMMUNITY)
Admission: EM | Admit: 2016-01-28 | Discharge: 2016-01-28 | Disposition: A | Discharge status: Home / Self Care | Payer: Self-pay | Attending: Emergency Medicine | Admitting: Emergency Medicine

## 2016-01-28 DIAGNOSIS — Z79899 Other long term (current) drug therapy: Secondary | ICD-10-CM | POA: Insufficient documentation

## 2016-01-28 DIAGNOSIS — I10 Essential (primary) hypertension: Secondary | ICD-10-CM | POA: Insufficient documentation

## 2016-01-28 DIAGNOSIS — Z7982 Long term (current) use of aspirin: Secondary | ICD-10-CM | POA: Insufficient documentation

## 2016-01-28 DIAGNOSIS — R519 Headache, unspecified: Secondary | ICD-10-CM

## 2016-01-28 DIAGNOSIS — F445 Conversion disorder with seizures or convulsions: Secondary | ICD-10-CM | POA: Insufficient documentation

## 2016-01-28 DIAGNOSIS — R51 Headache: Secondary | ICD-10-CM

## 2016-01-28 DIAGNOSIS — Z87891 Personal history of nicotine dependence: Secondary | ICD-10-CM | POA: Insufficient documentation

## 2016-01-28 HISTORY — DX: Unspecified convulsions: R56.9

## 2016-01-28 LAB — BASIC METABOLIC PANEL
ANION GAP: 8 (ref 5–15)
BUN: 11 mg/dL (ref 6–20)
CALCIUM: 9.2 mg/dL (ref 8.9–10.3)
CO2: 26 mmol/L (ref 22–32)
CREATININE: 0.6 mg/dL (ref 0.44–1.00)
Chloride: 103 mmol/L (ref 101–111)
Glucose, Bld: 109 mg/dL — ABNORMAL HIGH (ref 65–99)
Potassium: 3.7 mmol/L (ref 3.5–5.1)
SODIUM: 137 mmol/L (ref 135–145)

## 2016-01-28 LAB — CBC WITH DIFFERENTIAL/PLATELET
BASOS ABS: 0 10*3/uL (ref 0.0–0.1)
BASOS PCT: 0 %
EOS ABS: 0.2 10*3/uL (ref 0.0–0.7)
EOS PCT: 3 %
HEMATOCRIT: 39.1 % (ref 36.0–46.0)
Hemoglobin: 13 g/dL (ref 12.0–15.0)
Lymphocytes Relative: 30 %
Lymphs Abs: 2.7 10*3/uL (ref 0.7–4.0)
MCH: 29.7 pg (ref 26.0–34.0)
MCHC: 33.2 g/dL (ref 30.0–36.0)
MCV: 89.5 fL (ref 78.0–100.0)
MONO ABS: 0.6 10*3/uL (ref 0.1–1.0)
MONOS PCT: 7 %
Neutro Abs: 5.5 10*3/uL (ref 1.7–7.7)
Neutrophils Relative %: 60 %
PLATELETS: 260 10*3/uL (ref 150–400)
RBC: 4.37 MIL/uL (ref 3.87–5.11)
RDW: 13 % (ref 11.5–15.5)
WBC: 9 10*3/uL (ref 4.0–10.5)

## 2016-01-28 MED ORDER — HYDROMORPHONE HCL 1 MG/ML IJ SOLN
1.0000 mg | Freq: Once | INTRAMUSCULAR | Status: AC
  Administered 2016-01-28: 1 mg via INTRAVENOUS
  Filled 2016-01-28: qty 1

## 2016-01-28 MED ORDER — HYDROCODONE-ACETAMINOPHEN 5-325 MG PO TABS: 1.0000 | ORAL_TABLET | Freq: Four times a day (QID) | ORAL | 0 refills | Status: DC | PRN

## 2016-01-28 MED ORDER — ONDANSETRON HCL 4 MG/2ML IJ SOLN
4.0000 mg | Freq: Once | INTRAMUSCULAR | Status: AC
  Administered 2016-01-28: 4 mg via INTRAVENOUS
  Filled 2016-01-28: qty 2

## 2016-01-28 MED ORDER — ONDANSETRON 4 MG PO TBDP: ORAL_TABLET | ORAL | 0 refills | Status: DC

## 2016-01-28 MED ORDER — KETOROLAC TROMETHAMINE 30 MG/ML IJ SOLN
30.0000 mg | Freq: Once | INTRAMUSCULAR | Status: AC
  Administered 2016-01-28: 30 mg via INTRAVENOUS
  Filled 2016-01-28: qty 1

## 2016-01-28 MED ORDER — PROMETHAZINE HCL 25 MG/ML IJ SOLN
25.0000 mg | Freq: Once | INTRAMUSCULAR | Status: AC
  Administered 2016-01-28: 25 mg via INTRAVENOUS
  Filled 2016-01-28: qty 1

## 2016-01-28 NOTE — ED Provider Notes (Signed)
[facility] DEPT Provider Note   CSN: [id number] Arrival date & time: 01/28/16  1639     History   Chief Complaint Chief Complaint  Patient presents with  . Seizures    HPI [name] is a 50 y.o. female.  Patient states that she had a headache today and then a syncopal episode. She was at a party after her wedding today patient has a history of pseudoseizures   The history is provided by the patient. No language interpreter was used.  Seizures   This is a recurrent problem. The current episode started 3 to 5 hours ago. The problem has been resolved. There was 1 seizure. Associated symptoms include sleepiness and headaches. Pertinent negatives include no chest pain, no cough and no diarrhea. Characteristics do not include eye blinking. The episode was witnessed. There was no sensation of an aura present. The seizures did not continue in the ED. The seizure(s) had no focality. There has been no fever.    Past Medical History:  Diagnosis Date  . Anxiety   . Depression   . Hypertension   . Rosacea   . Seizures [facility])     Patient Active Problem List   Diagnosis Date Noted  . 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 Jerking movements of extremities 11/11/2015  . Major depressive disorder, recurrent severe without psychotic features ([place])   . Bipolar II disorder ([place]) 08/30/2015  . Suicide attempt by drug ingestion ([place]) 08/30/2015  . Suicidal ideation 08/30/2015  . Migraines 09/29/2014  . Migraine 05/19/2013  . Left-sided weakness 05/18/2013  . Numbness and tingling of left arm and leg 05/18/2013  . CVA (cerebral infarction) 05/18/2013  . Hypertension   . Anxiety     Past Surgical History:  Procedure Laterality Date  . ABDOMINAL HYSTERECTOMY     non cancerous  . APPENDECTOMY    . appendectomy    . BACK SURGERY    . CHOLECYSTECTOMY    . CHOLECYSTECTOMY    . lumbar     ruptured disc in lumbar taken out  . ROTATOR CUFF REPAIR Left   . TONSILLECTOMY      OB History    Gravida Para Term Preterm AB Living             2   SAB TAB Ectopic Multiple Live Births                   Home Medications    Prior to Admission medications   Medication Sig Start Date End Date Taking? Authorizing Provider  aspirin-acetaminophen-caffeine (EXCEDRIN MIGRAINE) [phone] MG tablet Take 1-2 tablets by mouth daily as needed for headache or migraine.   Yes Historical Provider, MD  fluvoxaMINE (LUVOX) 100 MG tablet Take 1 tablet (100 mg total) by mouth at bedtime. 11/13/15  Yes Patrecia Pour, MD  hydrOXYzine (ATARAX/VISTARIL) 50 MG tablet Take 1 tablet (50 mg total) by mouth every 4 (four) hours as needed for itching. 09/03/15  Yes Jolanta B Pucilowska, MD  Lacosamide 100 MG TABS Take 100 mg by mouth 2 (two) times daily.    Yes Historical Provider, MD  LORazepam (ATIVAN) 0.5 MG tablet Take 1 tablet (0.5 mg total) by mouth every 8 (eight) hours as needed for anxiety. 11/06/15 11/05/16 Yes Earleen Newport, MD  traZODone (DESYREL) 100 MG tablet Take 1 tablet (100 mg total) by mouth at bedtime. Patient taking differently: Take 50-100 mg by mouth at bedtime as needed for sleep.  09/03/15  Yes Clovis Fredrickson, MD  HYDROcodone-acetaminophen (NORCO/VICODIN) 5-325 MG tablet Take 1 tablet by mouth every 6 (six) hours as needed for severe pain. 01/28/16   [name] Ferguson, MD  ondansetron (ZOFRAN ODT) 4 MG disintegrating tablet 4mg  ODT q4 hours prn nausea/vomit 01/28/16   [name] Ferguson, MD    Family History Family History  Problem Relation Age of Onset  . Diabetes Father   . Heart disease Father 66  . Hypertension Father   . Cancer Maternal Grandfather     colon cancer (70's)  . Diabetes Paternal Grandmother   . Diabetes Paternal Grandfather   . Cancer Cousin     breast    Social History Social History  Substance Use Topics  . Smoking status: Former Research scientist (life sciences)  . Smokeless tobacco: Never Used  . Alcohol use No     Comment: occassionally     Allergies   Lamotrigine and  Penicillins   Review of Systems Review of Systems  Constitutional: Negative for appetite change and fatigue.  HENT: Negative for congestion, ear discharge and sinus pressure.   Eyes: Negative for discharge.  Respiratory: Negative for cough.   Cardiovascular: Negative for chest pain.  Gastrointestinal: Negative for abdominal pain and diarrhea.  Genitourinary: Negative for frequency and hematuria.  Musculoskeletal: Negative for back pain.  Skin: Negative for rash.  Neurological: Positive for seizures and headaches.  Psychiatric/Behavioral: Negative for hallucinations.     Physical Exam Updated Vital Signs BP 101/56 (BP Location: Right Arm)   Pulse 63   Temp 97.5 F (36.4 C) (Oral)   Resp 16   Ht 5\' 7"  (1.702 m)   Wt 198 lb (89.8 kg)   SpO2 99%   BMI 31.01 kg/m   Physical Exam  Constitutional: She is oriented to person, place, and time. She appears well-developed.  HENT:  Head: Normocephalic.  Eyes: Conjunctivae and EOM are normal. No scleral icterus.  Neck: Neck supple. No thyromegaly present.  Cardiovascular: Normal rate and regular rhythm.  Exam reveals no gallop and no friction rub.   No murmur heard. Pulmonary/Chest: No stridor. She has no wheezes. She has no rales. She exhibits no tenderness.  Abdominal: She exhibits no distension. There is no tenderness. There is no rebound.  Musculoskeletal: Normal range of motion. She exhibits no edema.  Lymphadenopathy:    She has no cervical adenopathy.  Neurological: She is oriented to person, place, and time. She exhibits normal muscle tone. Coordination normal.  Skin: No rash noted. No erythema.  Psychiatric: She has a normal mood and affect. Her behavior is normal.     ED Treatments / Results  Labs (all labs ordered are listed, but only abnormal results are displayed) Labs Reviewed  BASIC METABOLIC PANEL - Abnormal; Notable for the following:       Result Value   Glucose, Bld 109 (*)    All other components within  normal limits  CBC WITH DIFFERENTIAL/PLATELET    EKG  EKG Interpretation None       Radiology No results found.  Procedures Procedures (including critical care time)  Medications Ordered in ED Medications  ketorolac (TORADOL) 30 MG/ML injection 30 mg (30 mg Intravenous Given 01/28/16 1942)  ondansetron (ZOFRAN) injection 4 mg (4 mg Intravenous Given 01/28/16 1941)  HYDROmorphone (DILAUDID) injection 1 mg (1 mg Intravenous Given 01/28/16 2050)  promethazine (PHENERGAN) injection 25 mg (25 mg Intravenous Given 01/28/16 2049)     Initial Impression / Assessment and Plan / ED Course  I have reviewed the triage vital signs and the nursing  notes.  Pertinent labs & imaging results that were available during my care of the patient were reviewed by me and considered in my medical decision making (see chart for details).  Clinical Course     Labs unremarkable. Suspect patient had pseudoseizure secondary to headache and stress of recent marriage. She will follow-up as needed and is given some pain medicine and nausea medicine for headache  Final Clinical Impressions(s) / ED Diagnoses   Final diagnoses:  Pseudoseizure  Headache behind the eyes    New Prescriptions New Prescriptions   HYDROCODONE-ACETAMINOPHEN (NORCO/VICODIN) 5-325 MG TABLET    Take 1 tablet by mouth every 6 (six) hours as needed for severe pain.   ONDANSETRON (ZOFRAN ODT) 4 MG DISINTEGRATING TABLET    4mg  ODT q4 hours prn nausea/vomit     [name] Ferguson, MD 01/28/16 2125

## 2016-01-28 NOTE — ED Triage Notes (Signed)
Family states pt has had seizures today, started having "uncontrollable crying and shaking." Pt does not appear post ictal in Triage. Pt c/o nausea and migraine.

## 2016-01-28 NOTE — Discharge Instructions (Signed)
Follow up next week if not improving. °

## 2016-03-13 ENCOUNTER — Encounter: Payer: Self-pay | Admitting: Family Medicine

## 2016-03-13 DIAGNOSIS — R569 Unspecified convulsions: Secondary | ICD-10-CM | POA: Insufficient documentation

## 2016-03-14 ENCOUNTER — Encounter (HOSPITAL_COMMUNITY): Payer: Self-pay | Admitting: Emergency Medicine

## 2016-03-14 ENCOUNTER — Emergency Department (HOSPITAL_COMMUNITY)
Admission: EM | Admit: 2016-03-14 | Discharge: 2016-03-14 | Disposition: A | Discharge status: Home / Self Care | Payer: Self-pay | Attending: Emergency Medicine | Admitting: Emergency Medicine

## 2016-03-14 DIAGNOSIS — R519 Headache, unspecified: Secondary | ICD-10-CM

## 2016-03-14 DIAGNOSIS — Z79899 Other long term (current) drug therapy: Secondary | ICD-10-CM | POA: Insufficient documentation

## 2016-03-14 DIAGNOSIS — I10 Essential (primary) hypertension: Secondary | ICD-10-CM | POA: Insufficient documentation

## 2016-03-14 DIAGNOSIS — Z87891 Personal history of nicotine dependence: Secondary | ICD-10-CM | POA: Insufficient documentation

## 2016-03-14 DIAGNOSIS — Z7982 Long term (current) use of aspirin: Secondary | ICD-10-CM | POA: Insufficient documentation

## 2016-03-14 DIAGNOSIS — R479 Unspecified speech disturbances: Secondary | ICD-10-CM | POA: Insufficient documentation

## 2016-03-14 DIAGNOSIS — R51 Headache: Secondary | ICD-10-CM | POA: Insufficient documentation

## 2016-03-14 LAB — I-STAT CHEM 8, ED
BUN: 11 mg/dL (ref 6–20)
CALCIUM ION: 1.21 mmol/L (ref 1.15–1.40)
CREATININE: 0.6 mg/dL (ref 0.44–1.00)
Chloride: 110 mmol/L (ref 101–111)
Glucose, Bld: 79 mg/dL (ref 65–99)
HCT: 37 % (ref 36.0–46.0)
Hemoglobin: 12.6 g/dL (ref 12.0–15.0)
Potassium: 4.1 mmol/L (ref 3.5–5.1)
Sodium: 142 mmol/L (ref 135–145)
TCO2: 22 mmol/L (ref 0–100)

## 2016-03-14 MED ORDER — LORAZEPAM 2 MG/ML IJ SOLN
1.0000 mg | Freq: Once | INTRAMUSCULAR | Status: AC
  Administered 2016-03-14: 1 mg via INTRAVENOUS
  Filled 2016-03-14: qty 1

## 2016-03-14 MED ORDER — SODIUM CHLORIDE 0.9 % IV BOLUS (SEPSIS)
1000.0000 mL | Freq: Once | INTRAVENOUS | Status: AC
  Administered 2016-03-14: 1000 mL via INTRAVENOUS

## 2016-03-14 MED ORDER — PROCHLORPERAZINE EDISYLATE 5 MG/ML IJ SOLN
10.0000 mg | Freq: Once | INTRAMUSCULAR | Status: AC
  Administered 2016-03-14: 10 mg via INTRAVENOUS
  Filled 2016-03-14: qty 2

## 2016-03-14 MED ORDER — DIPHENHYDRAMINE HCL 50 MG/ML IJ SOLN
25.0000 mg | Freq: Once | INTRAMUSCULAR | Status: AC
  Administered 2016-03-14: 25 mg via INTRAVENOUS
  Filled 2016-03-14: qty 1

## 2016-03-14 NOTE — ED Notes (Signed)
Pt and family stated understanding of d/c instructions and follow up care at this time. No further needs voiced.

## 2016-03-14 NOTE — ED Notes (Signed)
Pt ripped out IV, catheter intact. MD made aware.

## 2016-03-14 NOTE — ED Triage Notes (Signed)
Per family member, patient was in parking lot at store and patient suddenly opened her mouth and "her mouth locked up open and she wasn't responding. She was standing up, she never fell." Patient answers questions at triage. Complaining of pain to head.

## 2016-03-14 NOTE — ED Provider Notes (Signed)
[place] DEPT Provider Note   CSN: [id number] Arrival date & time: 03/14/16  1209   By signing my name below, I, Collene Leyden, attest that this documentation has been prepared under the direction and in the presence of Noemi Chapel, MD. Electronically Signed: Collene Leyden, Scribe. 03/14/16. 12:37 PM.   History   Chief Complaint Chief Complaint  Patient presents with  . Seizures    HPI Comments: Priseis Despain is a 51 y.o. female with a hx of seizures, who presents to the Emergency Department complaining of intermittent seizures that began yesterday. Per son she had an episode yesterday in which she came out of the grocery store and she began yawning, jaw locked up and she was unable to talk or close her mouth. Patient has associated speech difficulty. Son also reports 1 episode yesterday. Patient takes topamax twice a day. She hasn't taken anything today. No longer taking her blood pressure medications due to low blood pressure. No new medications. Neurologist is Dr. Chad Cordial.  She denies any other symptoms.    The history is provided by the patient. No language interpreter was used.    Past Medical History:  Diagnosis Date  . Anxiety   . Depression   . Hypertension   . Rosacea   . Seizures [facility])    [facility] (Dr. 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 Seizures ([place])   . 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 Jerking movements of extremities 11/11/2015  . Major depressive disorder, recurrent severe without psychotic features ([place])   . Bipolar II disorder ([place]) 08/30/2015  . Suicide attempt by drug ingestion ([place]) 08/30/2015  . Suicidal ideation 08/30/2015  . Migraines 09/29/2014  . Migraine 05/19/2013  . Left-sided weakness 05/18/2013  . Numbness and tingling of left arm and leg 05/18/2013  . CVA (cerebral infarction) 05/18/2013  . Hypertension   .  Anxiety     Past Surgical History:  Procedure Laterality Date  . ABDOMINAL HYSTERECTOMY     non cancerous  . APPENDECTOMY    . appendectomy    . BACK SURGERY    . CHOLECYSTECTOMY    . CHOLECYSTECTOMY    . lumbar     ruptured disc in lumbar taken out  . ROTATOR CUFF REPAIR Left   . TONSILLECTOMY      OB History    Gravida Para Term Preterm AB Living             2   SAB TAB Ectopic Multiple Live Births                   Home Medications    Prior to Admission medications   Medication Sig Start Date End Date Taking? Authorizing Provider  aspirin-acetaminophen-caffeine (EXCEDRIN MIGRAINE) [phone] MG tablet Take 1-2 tablets by mouth daily as needed for headache or migraine.   Yes Historical Provider, MD  fluvoxaMINE (LUVOX) 100 MG tablet Take 1 tablet (100 mg total) by mouth at bedtime. 11/13/15  Yes Patrecia Pour, MD  hydrOXYzine (ATARAX/VISTARIL) 50 MG tablet Take 1 tablet (50 mg total) by mouth every 4 (four) hours as needed for itching. 09/03/15  Yes Jolanta B Pucilowska, MD  Lacosamide 100 MG TABS Take 100 mg by mouth 2 (two) times daily.    Yes Historical Provider, MD  LORazepam (ATIVAN) 0.5 MG tablet Take 1 tablet (0.5 mg total) by mouth every 8 (eight) hours  as needed for anxiety. 11/06/15 11/05/16 Yes Earleen Newport, MD  ondansetron (ZOFRAN ODT) 4 MG disintegrating tablet 4mg  ODT q4 hours prn nausea/vomit 01/28/16  Yes [name] Ferguson, MD  topiramate (TOPAMAX) 50 MG tablet Take 1 tablet by mouth at bedtime FOR 1 WEEK THEN twice a day for  FOR 1 WEEK THEN one in the morning  AND 2 at bedtime THEREAFTER 02/16/16  Yes Historical Provider, MD  traZODone (DESYREL) 100 MG tablet Take 1 tablet (100 mg total) by mouth at bedtime. Patient taking differently: Take 50-100 mg by mouth at bedtime as needed for sleep.  09/03/15  Yes Clovis Fredrickson, MD    Family History Family History  Problem Relation Age of Onset  . Diabetes Father   . Heart disease Father 74  . Hypertension  Father   . Cancer Maternal Grandfather     colon cancer (70's)  . Diabetes Paternal Grandmother   . Diabetes Paternal Grandfather   . Cancer Cousin     breast    Social History Social History  Substance Use Topics  . Smoking status: Former Research scientist (life sciences)  . Smokeless tobacco: Never Used  . Alcohol use No     Comment: occassionally     Allergies   Toradol [ketorolac tromethamine]; Lamotrigine; and Penicillins   Review of Systems Review of Systems  Neurological: Positive for speech difficulty.  All other systems reviewed and are negative.    Physical Exam Updated Vital Signs BP 132/82   Pulse 63   Temp 99.3 F (37.4 C) (Oral)   Resp 16   Ht 5\' 7"  (1.702 m)   Wt 205 lb (93 kg)   SpO2 100%   BMI 32.11 kg/m   Physical Exam  Constitutional: She appears well-developed and well-nourished. No distress.  HENT:  Head: Normocephalic and atraumatic.  Mouth/Throat: Oropharynx is clear and moist.  Eyes: Conjunctivae are normal. Pupils are equal, round, and reactive to light. Right eye exhibits no discharge. Left eye exhibits no discharge.  Neck: Neck supple.  Cardiovascular: Normal rate, regular rhythm, normal heart sounds and intact distal pulses.  Exam reveals no gallop and no friction rub.   No murmur heard. Pulmonary/Chest: Effort normal and breath sounds normal. No respiratory distress. She has no wheezes. She has no rales.  Abdominal: Soft. There is no tenderness.  Musculoskeletal: She exhibits no edema.  Lymphadenopathy:    She has no cervical adenopathy.  Neurological: She is alert. Coordination normal.  On initial presentation the patient and been keeping her mouth open in her tongue protruding, this became more and more mild over several minutes and by the end of our conversation she was speaking in a normal fashion with normal speech, normal phonation, normal jaw approximation. All other neurologic findings were normal with the ability to raise both arms and both legs,  normal strength, normal grips, normal finger-nose-finger, normal coordination, no pronator drift  Skin: Skin is warm and dry. No rash noted. She is not diaphoretic. No erythema. No pallor.  Psychiatric: She has a normal mood and affect. Her behavior is normal.  Nursing note and vitals reviewed.    ED Treatments / Results  DIAGNOSTIC STUDIES: Oxygen Saturation is 99% on RA, normal by my interpretation.    COORDINATION OF CARE: 12:31 PM Discussed treatment plan with pt at bedside and pt agreed to plan.  Labs (all labs ordered are listed, but only abnormal results are displayed) Labs Reviewed  I-STAT CHEM 8, ED    EKG  EKG Interpretation  None       Radiology No results found.  Procedures Procedures (including critical care time)  Medications Ordered in ED Medications  LORazepam (ATIVAN) injection 1 mg (1 mg Intravenous Given 03/14/16 1258)  sodium chloride 0.9 % bolus 1,000 mL (0 mLs Intravenous Stopped 03/14/16 1414)  prochlorperazine (COMPAZINE) injection 10 mg (10 mg Intravenous Given 03/14/16 1259)  diphenhydrAMINE (BENADRYL) injection 25 mg (25 mg Intravenous Given 03/14/16 1258)     Initial Impression / Assessment and Plan / ED Course  I have reviewed the triage vital signs and the nursing notes.  Pertinent labs & imaging results that were available during my care of the patient were reviewed by me and considered in my medical decision making (see chart for details).  Clinical Course    When walked in the room the patient was holding her mouth open like she couldn't talk. By the end of the visit she was able to close her mouth and talk.     Final Clinical Impressions(s) / ED Diagnoses   Final diagnoses:  Nonintractable headache, unspecified chronicity pattern, unspecified headache type    New Prescriptions Current Discharge Medication List     Overall the patient is well-appearing, she returned to her normal mental status, specifically with regards to her  speech and her mouth, there was some report that she does this before having a seizure but she never had seizure activity. She was given a milligram of Ativan as well as a medication for her headache including Compazine and Benadryl, she has had normal vital signs and has had no complaints since that time. I've spoken with her significant other as well as her child, I feel that she is stable for discharge at this time.   I personally performed the services described in this documentation, which was scribed in my presence. The recorded information has been reviewed and is accurate.       Noemi Chapel, MD 03/14/16 1530

## 2016-04-13 ENCOUNTER — Emergency Department (HOSPITAL_COMMUNITY)
Admission: EM | Admit: 2016-04-13 | Discharge: 2016-04-13 | Disposition: A | Discharge status: Home / Self Care | Payer: Self-pay | Attending: Emergency Medicine | Admitting: Emergency Medicine

## 2016-04-13 ENCOUNTER — Encounter (HOSPITAL_COMMUNITY): Payer: Self-pay | Admitting: Emergency Medicine

## 2016-04-13 ENCOUNTER — Emergency Department (HOSPITAL_COMMUNITY): Payer: Self-pay

## 2016-04-13 DIAGNOSIS — R0602 Shortness of breath: Secondary | ICD-10-CM | POA: Insufficient documentation

## 2016-04-13 DIAGNOSIS — Z79899 Other long term (current) drug therapy: Secondary | ICD-10-CM | POA: Insufficient documentation

## 2016-04-13 DIAGNOSIS — R69 Illness, unspecified: Secondary | ICD-10-CM

## 2016-04-13 DIAGNOSIS — Z87891 Personal history of nicotine dependence: Secondary | ICD-10-CM | POA: Insufficient documentation

## 2016-04-13 DIAGNOSIS — R05 Cough: Secondary | ICD-10-CM | POA: Insufficient documentation

## 2016-04-13 DIAGNOSIS — R51 Headache: Secondary | ICD-10-CM | POA: Insufficient documentation

## 2016-04-13 DIAGNOSIS — M791 Myalgia: Secondary | ICD-10-CM | POA: Insufficient documentation

## 2016-04-13 DIAGNOSIS — J111 Influenza due to unidentified influenza virus with other respiratory manifestations: Secondary | ICD-10-CM

## 2016-04-13 DIAGNOSIS — R112 Nausea with vomiting, unspecified: Secondary | ICD-10-CM | POA: Insufficient documentation

## 2016-04-13 DIAGNOSIS — I10 Essential (primary) hypertension: Secondary | ICD-10-CM | POA: Insufficient documentation

## 2016-04-13 DIAGNOSIS — R509 Fever, unspecified: Secondary | ICD-10-CM | POA: Insufficient documentation

## 2016-04-13 MED ORDER — IBUPROFEN 800 MG PO TABS
800.0000 mg | ORAL_TABLET | Freq: Once | ORAL | Status: AC
  Administered 2016-04-13: 800 mg via ORAL
  Filled 2016-04-13: qty 1

## 2016-04-13 MED ORDER — PROMETHAZINE HCL 12.5 MG PO TABS
25.0000 mg | ORAL_TABLET | Freq: Once | ORAL | Status: AC
  Administered 2016-04-13: 25 mg via ORAL
  Filled 2016-04-13: qty 2

## 2016-04-13 MED ORDER — PROMETHAZINE HCL 25 MG PO TABS: 25.0000 mg | ORAL_TABLET | Freq: Four times a day (QID) | ORAL | 0 refills | Status: DC | PRN

## 2016-04-13 NOTE — ED Triage Notes (Signed)
Pt C/O of cough, fever, and chills. PT states the cough has been going on for about a week. Pt also states that she has a history of seizures and had one on Tuesday.

## 2016-04-13 NOTE — ED Provider Notes (Signed)
[place] DEPT Provider Note   CSN: [id number] Arrival date & time: 04/13/16  0308     History   Chief Complaint Chief Complaint  Patient presents with  . Cough    HPI [name] is a 51 y.o. female.  The history is provided by the patient.  Cough  This is a new problem. The current episode started more than 2 days ago. The problem has been gradually worsening. The maximum temperature recorded prior to her arrival was 100 to 100.9 F. Associated symptoms include chills, headaches, myalgias and shortness of breath. She has tried cough syrup for the symptoms. The treatment provided no relief. She is not a smoker.  Patient reports cough for several days and developed fever/chills/myalgias yesterday She reports nausea and an episode of vomiting No diarrhea She reports CP with cough She reports feeling SOB  Past Medical History:  Diagnosis Date  . Anxiety   . Depression   . Hypertension   . Rosacea   . Seizures [facility])    [facility] (Dr. 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 Seizures ([place])   . 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 Jerking movements of extremities 11/11/2015  . Major depressive disorder, recurrent severe without psychotic features ([place])   . Bipolar II disorder ([place]) 08/30/2015  . Suicide attempt by drug ingestion ([place]) 08/30/2015  . Suicidal ideation 08/30/2015  . Migraines 09/29/2014  . Migraine 05/19/2013  . Left-sided weakness 05/18/2013  . Numbness and tingling of left arm and leg 05/18/2013  . CVA (cerebral infarction) 05/18/2013  . Hypertension   . Anxiety     Past Surgical History:  Procedure Laterality Date  . ABDOMINAL HYSTERECTOMY     non cancerous  . APPENDECTOMY    . appendectomy    . BACK SURGERY    . CHOLECYSTECTOMY    . CHOLECYSTECTOMY    . lumbar     ruptured disc in lumbar taken out  . ROTATOR CUFF REPAIR  Left   . TONSILLECTOMY      OB History    Gravida Para Term Preterm AB Living             2   SAB TAB Ectopic Multiple Live Births                   Home Medications    Prior to Admission medications   Medication Sig Start Date End Date Taking? Authorizing Provider  aspirin-acetaminophen-caffeine (EXCEDRIN MIGRAINE) [phone] MG tablet Take 1-2 tablets by mouth daily as needed for headache or migraine.    Historical Provider, MD  fluvoxaMINE (LUVOX) 100 MG tablet Take 1 tablet (100 mg total) by mouth at bedtime. 11/13/15   Patrecia Pour, MD  hydrOXYzine (ATARAX/VISTARIL) 50 MG tablet Take 1 tablet (50 mg total) by mouth every 4 (four) hours as needed for itching. 09/03/15   Clovis Fredrickson, MD  Lacosamide 100 MG TABS Take 100 mg by mouth 2 (two) times daily.     Historical Provider, MD  LORazepam (ATIVAN) 0.5 MG tablet Take 1 tablet (0.5 mg total) by mouth every 8 (eight) hours as needed for anxiety. 11/06/15 11/05/16  Earleen Newport, MD  ondansetron (ZOFRAN ODT) 4 MG disintegrating tablet 4mg  ODT q4 hours prn nausea/vomit 01/28/16   [name] Ferguson, MD  topiramate (TOPAMAX) 50 MG tablet Take 1 tablet by mouth at bedtime FOR 1 WEEK THEN  twice a day for  FOR 1 WEEK THEN one in the morning  AND 2 at bedtime THEREAFTER 02/16/16   Historical Provider, MD  traZODone (DESYREL) 100 MG tablet Take 1 tablet (100 mg total) by mouth at bedtime. Patient taking differently: Take 50-100 mg by mouth at bedtime as needed for sleep.  09/03/15   Clovis Fredrickson, MD    Family History Family History  Problem Relation Age of Onset  . Diabetes Father   . Heart disease Father 11  . Hypertension Father   . Cancer Maternal Grandfather     colon cancer (70's)  . Diabetes Paternal Grandmother   . Diabetes Paternal Grandfather   . Cancer Cousin     breast    Social History Social History  Substance Use Topics  . Smoking status: Former Research scientist (life sciences)  . Smokeless tobacco: Never Used  . Alcohol use No       Comment: occassionally     Allergies   Toradol [ketorolac tromethamine]; Lamotrigine; and Penicillins   Review of Systems Review of Systems  Constitutional: Positive for chills.  Respiratory: Positive for cough and shortness of breath.   Musculoskeletal: Positive for myalgias.  Neurological: Positive for headaches.  All other systems reviewed and are negative.    Physical Exam Updated Vital Signs BP 125/76 (BP Location: Right Arm)   Pulse 103   Temp 100.7 F (38.2 C) (Oral)   Resp 21   Ht 5\' 7"  (1.702 m)   Wt 93 kg   SpO2 95%   BMI 32.11 kg/m   Physical Exam CONSTITUTIONAL: Well developed/well nourished HEAD: Normocephalic/atraumatic EYES: EOMI/PERRL ENMT: Mucous membranes moist, uvula midline without erythema/exudates NECK: supple no meningeal signs SPINE/BACK:entire spine nontender CV: S1/S2 noted, no murmurs/rubs/gallops noted LUNGS: crackles in right base, no apparent distress ABDOMEN: soft, nontender NEURO: Pt is awake/alert/appropriate, moves all extremitiesx4.  No facial droop.   EXTREMITIES: pulses normal/equal, full ROM SKIN: warm, color normal PSYCH: no abnormalities of mood noted, alert and oriented to situation   ED Treatments / Results  Labs (all labs ordered are listed, but only abnormal results are displayed) Labs Reviewed - No data to display  EKG  EKG Interpretation None       Radiology Dg Chest 2 View  Result Date: 04/13/2016 CLINICAL DATA:  51 y/o  F; cough, fever, and chills. EXAM: CHEST  2 VIEW COMPARISON:  08/05/2015 chest radiograph FINDINGS: Stable heart size and mediastinal contours are within normal limits. Both lungs are clear. The visualized skeletal structures are unremarkable. Cholecystectomy clips. IMPRESSION: No active cardiopulmonary disease. Electronically Signed   By: Kristine Garbe M.D.   On: 04/13/2016 04:15    Procedures Procedures (including critical care time)  Medications Ordered in  ED Medications  ibuprofen (ADVIL,MOTRIN) tablet 800 mg (800 mg Oral Given 04/13/16 0347)  promethazine (PHENERGAN) tablet 25 mg (25 mg Oral Given 04/13/16 0347)     Initial Impression / Assessment and Plan / ED Course  I have reviewed the triage vital signs and the nursing notes.  Pertinent imaging results that were available during my care of the patient were reviewed by me and considered in my medical decision making (see chart for details).     5:07 AM PT WELL APPEARING NO SIGNS OF RESPIRATORY DISTRESS NO HYPOXIA WHEN I WAS IN ROOM PT IS RESTING COMFORTABLY I SUSPECT THIS IS INFLUENZA ADVISED HYDRATION AND TO CONTROL FEVER AT HOME I OFFERED HER TAMIFLU DUE TO HER OTHER CO-MORBIDITIES HOWEVER, SHE DECLINES TAMIFLU I FEEL  SHE IS SAFE FOR D/C HOME WE DISCUSSED STRICT ER RETURN PRECAUTIONS   Final Clinical Impressions(s) / ED Diagnoses   Final diagnoses:  Influenza-like illness    New Prescriptions New Prescriptions   PROMETHAZINE (PHENERGAN) 25 MG TABLET    Take 1 tablet (25 mg total) by mouth every 6 (six) hours as needed for nausea or vomiting.     Ripley Fraise, MD 04/13/16 [phone]

## 2016-04-13 NOTE — ED Notes (Signed)
Pt wheeled to waiting room. Pt verbalized understanding of discharge instructions.   

## 2016-04-25 ENCOUNTER — Encounter: Payer: Self-pay | Admitting: Family Medicine

## 2016-04-25 ENCOUNTER — Ambulatory Visit (INDEPENDENT_AMBULATORY_CARE_PROVIDER_SITE_OTHER): Payer: Self-pay | Admitting: Family Medicine

## 2016-04-25 VITALS — BP 126/74 | HR 92 | Temp 98.3°F | Resp 16 | Ht 66.5 in | Wt 204.0 lb

## 2016-04-25 DIAGNOSIS — J019 Acute sinusitis, unspecified: Secondary | ICD-10-CM

## 2016-04-25 MED ORDER — AZITHROMYCIN 250 MG PO TABS: ORAL_TABLET | ORAL | 0 refills | Status: DC
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
## 2016-04-28 ENCOUNTER — Encounter: Payer: Self-pay | Admitting: Family Medicine

## 2016-05-31 ENCOUNTER — Emergency Department (HOSPITAL_COMMUNITY): Payer: Self-pay

## 2016-05-31 ENCOUNTER — Encounter (HOSPITAL_COMMUNITY): Payer: Self-pay | Admitting: Adult Health

## 2016-05-31 ENCOUNTER — Emergency Department (HOSPITAL_COMMUNITY)
Admission: EM | Admit: 2016-05-31 | Discharge: 2016-06-01 | Disposition: A | Discharge status: Home / Self Care | Payer: Self-pay | Attending: Emergency Medicine | Admitting: Emergency Medicine

## 2016-05-31 DIAGNOSIS — Z87891 Personal history of nicotine dependence: Secondary | ICD-10-CM | POA: Insufficient documentation

## 2016-05-31 DIAGNOSIS — R51 Headache: Secondary | ICD-10-CM | POA: Insufficient documentation

## 2016-05-31 DIAGNOSIS — R112 Nausea with vomiting, unspecified: Secondary | ICD-10-CM | POA: Insufficient documentation

## 2016-05-31 DIAGNOSIS — R1013 Epigastric pain: Secondary | ICD-10-CM | POA: Insufficient documentation

## 2016-05-31 DIAGNOSIS — R5383 Other fatigue: Secondary | ICD-10-CM | POA: Insufficient documentation

## 2016-05-31 DIAGNOSIS — R079 Chest pain, unspecified: Secondary | ICD-10-CM | POA: Insufficient documentation

## 2016-05-31 DIAGNOSIS — Z79899 Other long term (current) drug therapy: Secondary | ICD-10-CM | POA: Insufficient documentation

## 2016-05-31 LAB — CBC
HEMATOCRIT: 42.1 % (ref 36.0–46.0)
Hemoglobin: 14.7 g/dL (ref 12.0–15.0)
MCH: 30.6 pg (ref 26.0–34.0)
MCHC: 34.9 g/dL (ref 30.0–36.0)
MCV: 87.5 fL (ref 78.0–100.0)
Platelets: 259 10*3/uL (ref 150–400)
RBC: 4.81 MIL/uL (ref 3.87–5.11)
RDW: 13.6 % (ref 11.5–15.5)
WBC: 7.8 10*3/uL (ref 4.0–10.5)

## 2016-05-31 LAB — BASIC METABOLIC PANEL
ANION GAP: 12 (ref 5–15)
BUN: 9 mg/dL (ref 6–20)
CO2: 18 mmol/L — ABNORMAL LOW (ref 22–32)
Calcium: 9 mg/dL (ref 8.9–10.3)
Chloride: 107 mmol/L (ref 101–111)
Creatinine, Ser: 0.69 mg/dL (ref 0.44–1.00)
Glucose, Bld: 83 mg/dL (ref 65–99)
POTASSIUM: 3 mmol/L — AB (ref 3.5–5.1)
Sodium: 137 mmol/L (ref 135–145)

## 2016-05-31 LAB — URINALYSIS, ROUTINE W REFLEX MICROSCOPIC
Bilirubin Urine: NEGATIVE
GLUCOSE, UA: NEGATIVE mg/dL
KETONES UR: NEGATIVE mg/dL
Nitrite: NEGATIVE
Protein, ur: NEGATIVE mg/dL
Specific Gravity, Urine: 1.011 (ref 1.005–1.030)
pH: 6 (ref 5.0–8.0)

## 2016-05-31 LAB — HEPATIC FUNCTION PANEL
ALBUMIN: 4.2 g/dL (ref 3.5–5.0)
ALT: 37 U/L (ref 14–54)
AST: 31 U/L (ref 15–41)
Alkaline Phosphatase: 137 U/L — ABNORMAL HIGH (ref 38–126)
Bilirubin, Direct: <0.1 mg/dL — ABNORMAL LOW (ref 0.1–0.5)
Total Bilirubin: 0.4 mg/dL (ref 0.3–1.2)
Total Protein: 7.7 g/dL (ref 6.5–8.1)

## 2016-05-31 LAB — TROPONIN I: Troponin I: <0.03 ng/mL (ref <0.03)

## 2016-05-31 LAB — LIPASE, BLOOD: Lipase: 20 U/L (ref 11–51)

## 2016-05-31 MED ORDER — PROMETHAZINE HCL 25 MG/ML IJ SOLN
12.5000 mg | Freq: Once | INTRAMUSCULAR | Status: AC
  Administered 2016-05-31: 12.5 mg via INTRAVENOUS
  Filled 2016-05-31: qty 1

## 2016-05-31 MED ORDER — SODIUM CHLORIDE 0.9 % IV BOLUS (SEPSIS)
1000.0000 mL | Freq: Once | INTRAVENOUS | Status: AC
  Administered 2016-05-31: 1000 mL via INTRAVENOUS

## 2016-05-31 MED ORDER — FENTANYL CITRATE (PF) 100 MCG/2ML IJ SOLN
50.0000 ug | Freq: Once | INTRAMUSCULAR | Status: AC
  Administered 2016-05-31: 50 ug via INTRAVENOUS
  Filled 2016-05-31: qty 2

## 2016-05-31 NOTE — ED Notes (Signed)
Pt was c/o of headache Doctor notified pt medicated per orders

## 2016-05-31 NOTE — ED Triage Notes (Signed)
Presents with central chest pain described stabbing began this morning after having a seizure last night. Endorse nausea and severe fatigue. Nothing makes pain better and nothing makes chest pain worse. Chest pain comes and goes. Radiates into shoulders and neck. She endorses a stomach virus over the weekend as well and stated she had vomiting and diarrhea over the weekend with that. She reports dark urine and feeling very stressed as well. Recently had multiple medication changes.  dEneis thoughts of SI and HI.

## 2016-06-01 MED ORDER — PROMETHAZINE HCL 25 MG PO TABS: 25.0000 mg | ORAL_TABLET | Freq: Four times a day (QID) | ORAL | 0 refills | Status: DC | PRN

## 2016-06-01 NOTE — ED Provider Notes (Signed)
[place] DEPT Provider Note   CSN: [id number] Arrival date & time: 05/31/16  2000     History   Chief Complaint Chief Complaint  Patient presents with  . Chest Pain    HPI [name] is a 51 y.o. female.  HPI Patient presents with chest pain. Has had nausea vomiting and some diarrhea over this weekend. Last night had a seizure in her sleep. Chest pain will come and go. Has somewhat decreased oral intake. States she feels dehydrated. Has had recent changes in her medicines both for her depression and for her seizures. No fevers. Diarrhea has improved. Chest pain is burning. Not worse with breathing.   Past Medical History:  Diagnosis Date  . Anxiety   . Depression   . Hypertension   . Rosacea   . Seizures [facility])    [facility] (Dr. 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 Seizures ([place])   . 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 Jerking movements of extremities 11/11/2015  . Major depressive disorder, recurrent severe without psychotic features ([place])   . Bipolar II disorder ([place]) 08/30/2015  . Suicide attempt by drug ingestion ([place]) 08/30/2015  . Suicidal ideation 08/30/2015  . Migraines 09/29/2014  . Migraine 05/19/2013  . Left-sided weakness 05/18/2013  . Numbness and tingling of left arm and leg 05/18/2013  . CVA (cerebral infarction) 05/18/2013  . Hypertension   . Anxiety     Past Surgical History:  Procedure Laterality Date  . ABDOMINAL HYSTERECTOMY     non cancerous  . APPENDECTOMY    . appendectomy    . BACK SURGERY    . CHOLECYSTECTOMY    . CHOLECYSTECTOMY    . lumbar     ruptured disc in lumbar taken out  . ROTATOR CUFF REPAIR Left   . TONSILLECTOMY      OB History    Gravida Para Term Preterm AB Living             2   SAB TAB Ectopic Multiple Live Births                   Home Medications    Prior to Admission  medications   Medication Sig Start Date End Date Taking? Authorizing Provider  citalopram (CELEXA) 20 MG tablet TAKE ONE-HALF TABLET BY MOUTH ONCE DAILY 05/25/16  Yes Historical Provider, MD  hydrOXYzine (ATARAX/VISTARIL) 50 MG tablet Take 1 tablet (50 mg total) by mouth every 4 (four) hours as needed for itching. 09/03/15  Yes Jolanta B Pucilowska, MD  levETIRAcetam (KEPPRA) 1000 MG tablet Take 1,000 mg by mouth 2 (two) times daily. 05/18/16  Yes Historical Provider, MD  LORazepam (ATIVAN) 0.5 MG tablet Take 1 tablet (0.5 mg total) by mouth every 8 (eight) hours as needed for anxiety. 11/06/15 11/05/16 Yes Earleen Newport, MD  propranolol (INDERAL) 20 MG tablet take one tablet by mouth three times daily 05/18/16  Yes Historical Provider, MD  topiramate (TOPAMAX) 50 MG tablet TAKE ONE TABLET BY MOUTH ONCE DAILY IN THE MORNING AND TWO TABLETS EVERY DAY IN THE EVENING 02/16/16  Yes Historical Provider, MD  traZODone (DESYREL) 100 MG tablet Take 1 tablet (100 mg total) by mouth at bedtime. Patient taking differently: Take 50-100 mg by mouth at bedtime as needed for sleep.  09/03/15  Yes Jolanta B Pucilowska, MD  promethazine (PHENERGAN) 25 MG tablet Take 1 tablet (25  mg total) by mouth every 6 (six) hours as needed for nausea. 06/01/16   Davonna Belling, MD    Family History Family History  Problem Relation Age of Onset  . Diabetes Father   . Heart disease Father 97  . Hypertension Father   . Cancer Maternal Grandfather     colon cancer (70's)  . Diabetes Paternal Grandmother   . Diabetes Paternal Grandfather   . Cancer Cousin     breast    Social History Social History  Substance Use Topics  . Smoking status: Former Research scientist (life sciences)  . Smokeless tobacco: Never Used  . Alcohol use No     Comment: occassionally     Allergies   Toradol [ketorolac tromethamine]; Zofran [ondansetron hcl]; Lamotrigine; and Penicillins   Review of Systems Review of Systems  Constitutional: Positive for appetite  change and fatigue. Negative for fever.  HENT: Negative for congestion.   Respiratory: Negative for choking and shortness of breath.   Cardiovascular: Positive for chest pain.  Gastrointestinal: Positive for abdominal pain. Negative for nausea.  Genitourinary: Negative for dyspareunia.  Musculoskeletal: Negative for back pain.  Skin: Negative for pallor.  Psychiatric/Behavioral: Positive for dysphoric mood.     Physical Exam Updated Vital Signs BP 117/84   Pulse 64   Temp 97.8 F (36.6 C) (Oral)   Resp 18   SpO2 100%   Physical Exam  Constitutional: She appears well-developed.  HENT:  Head: Atraumatic.  Neck: Neck supple.  Cardiovascular: Normal rate.   Pulmonary/Chest: Effort normal.  Abdominal: Soft. There is tenderness.   mild epigastric tenderness without rebound or guarding.  Musculoskeletal: She exhibits no edema.  Neurological: She is alert.  Skin: Skin is warm.     ED Treatments / Results  Labs (all labs ordered are listed, but only abnormal results are displayed) Labs Reviewed  BASIC METABOLIC PANEL - Abnormal; Notable for the following:       Result Value   Potassium 3.0 (*)    CO2 18 (*)    All other components within normal limits  URINALYSIS, ROUTINE W REFLEX MICROSCOPIC - Abnormal; Notable for the following:    APPearance HAZY (*)    Hgb urine dipstick SMALL (*)    Leukocytes, UA TRACE (*)    Bacteria, UA RARE (*)    Squamous Epithelial / LPF 0-5 (*)    All other components within normal limits  HEPATIC FUNCTION PANEL - Abnormal; Notable for the following:    Alkaline Phosphatase 137 (*)    Bilirubin, Direct <0.1 (*)    All other components within normal limits  CBC  LIPASE, BLOOD  TROPONIN I    EKG  EKG Interpretation  Date/Time:  Wednesday May 31 2016 20:07:44 EDT Ventricular Rate:  60 PR Interval:    QRS Duration: 99 QT Interval:  431 QTC Calculation: 431 R Axis:   40 Text Interpretation:  Sinus rhythm Abnormal R-wave  progression, early transition No significant change since last tracing Confirmed by Alvino Chapel  MD, Ascencion Stegner [phone]) on 05/31/2016 8:21:57 PM       Radiology Dg Chest 2 View  Result Date: 05/31/2016 CLINICAL DATA:  Chest pain EXAM: CHEST  2 VIEW COMPARISON:  04/13/2016 FINDINGS: The heart size and mediastinal contours are within normal limits. Both lungs are clear. The visualized skeletal structures are unremarkable. IMPRESSION: No active cardiopulmonary disease. Electronically Signed   By: Franchot Gallo M.D.   On: 05/31/2016 21:30    Procedures Procedures (including critical care time)  Medications Ordered  in ED Medications  sodium chloride 0.9 % bolus 1,000 mL (1,000 mLs Intravenous New Bag/Given 05/31/16 2322)  sodium chloride 0.9 % bolus 1,000 mL (0 mLs Intravenous Stopped 05/31/16 2323)  promethazine (PHENERGAN) injection 12.5 mg (12.5 mg Intravenous Given 05/31/16 2105)  promethazine (PHENERGAN) injection 12.5 mg (12.5 mg Intravenous Given 05/31/16 2203)  fentaNYL (SUBLIMAZE) injection 50 mcg (50 mcg Intravenous Given 05/31/16 2322)     Initial Impression / Assessment and Plan / ED Course  I have reviewed the triage vital signs and the nursing notes.  Pertinent labs & imaging results that were available during my care of the patient were reviewed by me and considered in my medical decision making (see chart for details).     Patient with epigastric pain. Also has had chest pain. Likely from vomiting. X-ray reassuring. Feels better after IV fluids. Has tolerated orals. Will discharge home.  Final Clinical Impressions(s) / ED Diagnoses   Final diagnoses:  Non-intractable vomiting with nausea, unspecified vomiting type  Nonspecific chest pain    New Prescriptions New Prescriptions   PROMETHAZINE (PHENERGAN) 25 MG TABLET    Take 1 tablet (25 mg total) by mouth every 6 (six) hours as needed for nausea.     Davonna Belling, MD 06/01/16 [phone]

## 2016-06-05 ENCOUNTER — Other Ambulatory Visit (HOSPITAL_COMMUNITY): Payer: Self-pay | Admitting: *Deleted

## 2016-06-05 DIAGNOSIS — N644 Mastodynia: Secondary | ICD-10-CM

## 2016-06-20 ENCOUNTER — Ambulatory Visit
Admission: RE | Admit: 2016-06-20 | Discharge: 2016-06-20 | Disposition: A | Discharge status: Home / Self Care | Payer: No Typology Code available for payment source | Source: Ambulatory Visit | Attending: Obstetrics and Gynecology | Admitting: Obstetrics and Gynecology

## 2016-06-20 ENCOUNTER — Ambulatory Visit (HOSPITAL_COMMUNITY)
Admission: RE | Admit: 2016-06-20 | Discharge: 2016-06-20 | Disposition: A | Discharge status: Home / Self Care | Payer: Self-pay | Source: Ambulatory Visit | Attending: Obstetrics and Gynecology | Admitting: Obstetrics and Gynecology

## 2016-06-20 ENCOUNTER — Encounter (HOSPITAL_COMMUNITY): Payer: Self-pay

## 2016-06-20 VITALS — BP 118/80 | Temp 98.5°F | Ht 67.0 in | Wt 208.8 lb

## 2016-06-20 DIAGNOSIS — N644 Mastodynia: Secondary | ICD-10-CM

## 2016-06-20 DIAGNOSIS — Z1239 Encounter for other screening for malignant neoplasm of breast: Secondary | ICD-10-CM

## 2016-06-20 NOTE — Progress Notes (Signed)
Complaints of left outer breast pain x 1.5 months that comes and goes. Patient rates the pain at a 7 out of 10.  Pap Smear: Pap smear not completed today. Last Pap smear was in 2003 and normal per patient. Per patient has a history of an abnormal Pap smear 20-25 years ago that cryotherapy was completed for follow up. Patient has a history of a hysterectomy for ovarian cysts. Patient no longer needs Pap smears due to her history of a hysterectomy for benign reasons per BCCCP and ACOG guidelines. No Pap smear results are in EPIC.  Physical exam: Breasts Right breast slightly larger than left. Per patient has noticed any changes in her breast. No skin abnormalities bilateral breasts. No nipple retraction bilateral breasts. No nipple discharge bilateral breasts. No lymphadenopathy. No lumps palpated bilateral breasts. Complaints of left outer breast tenderness on exam. Referred patient to the [facility] for diagnostic mammogram and possible left breast ultrasound. Appointment scheduled for Tuesday, June 20, 2016 at 1320.        Pelvic/Bimanual No Pap smear completed today since patient has a history of a hysterectomy for benign reasons. Pap smear not indicated per BCCCP guidelines.   Smoking History: Patient has never smoked.  Patient Navigation: Patient education provided. Access to services provided for patient through [facility] program.

## 2016-06-20 NOTE — Patient Instructions (Signed)
Explained breast self awareness with [name]. Patient did not need a Pap smear today due to patient has a history of a hysterectomy for benign reasons. Let her know that she doesn't need any further Pap smears due to her history of a hysterectomy for benign reasons. Referred patient to the [facility] for diagnostic mammogram and possible left breast ultrasound. Appointment scheduled for Tuesday, June 20, 2016 at 1320.     [name] verbalized understanding.  Hasan Douse, Arvil Chaco, RN 2:53 PM

## 2016-06-23 ENCOUNTER — Encounter (HOSPITAL_COMMUNITY): Payer: Self-pay | Admitting: *Deleted

## 2016-10-04 ENCOUNTER — Emergency Department (HOSPITAL_COMMUNITY)
Admission: EM | Admit: 2016-10-04 | Discharge: 2016-10-04 | Disposition: A | Discharge status: Home / Self Care | Payer: No Typology Code available for payment source | Attending: Emergency Medicine | Admitting: Emergency Medicine

## 2016-10-04 ENCOUNTER — Encounter (HOSPITAL_COMMUNITY): Payer: Self-pay | Admitting: Emergency Medicine

## 2016-10-04 DIAGNOSIS — Z79899 Other long term (current) drug therapy: Secondary | ICD-10-CM | POA: Insufficient documentation

## 2016-10-04 DIAGNOSIS — R569 Unspecified convulsions: Secondary | ICD-10-CM

## 2016-10-04 DIAGNOSIS — Z87891 Personal history of nicotine dependence: Secondary | ICD-10-CM | POA: Insufficient documentation

## 2016-10-04 DIAGNOSIS — I10 Essential (primary) hypertension: Secondary | ICD-10-CM | POA: Insufficient documentation

## 2016-10-04 LAB — BASIC METABOLIC PANEL
Anion gap: 11 (ref 5–15)
BUN: 16 mg/dL (ref 6–20)
CO2: 19 mmol/L — ABNORMAL LOW (ref 22–32)
Calcium: 9.2 mg/dL (ref 8.9–10.3)
Chloride: 107 mmol/L (ref 101–111)
Creatinine, Ser: 0.67 mg/dL (ref 0.44–1.00)
GFR calc Af Amer: >60 mL/min (ref >60)
GFR calc non Af Amer: >60 mL/min (ref >60)
Glucose, Bld: 127 mg/dL — ABNORMAL HIGH (ref 65–99)
Potassium: 3.9 mmol/L (ref 3.5–5.1)
Sodium: 137 mmol/L (ref 135–145)

## 2016-10-04 LAB — CBC WITH DIFFERENTIAL/PLATELET
Basophils Absolute: 0 10*3/uL (ref 0.0–0.1)
Basophils Relative: 0 %
Eosinophils Absolute: 0.2 10*3/uL (ref 0.0–0.7)
Eosinophils Relative: 2 %
HCT: 37.8 % (ref 36.0–46.0)
Hemoglobin: 13 g/dL (ref 12.0–15.0)
Lymphocytes Relative: 34 %
Lymphs Abs: 2.6 10*3/uL (ref 0.7–4.0)
MCH: 31 pg (ref 26.0–34.0)
MCHC: 34.4 g/dL (ref 30.0–36.0)
MCV: 90.2 fL (ref 78.0–100.0)
Monocytes Absolute: 0.4 10*3/uL (ref 0.1–1.0)
Monocytes Relative: 6 %
Neutro Abs: 4.4 10*3/uL (ref 1.7–7.7)
Neutrophils Relative %: 58 %
Platelets: 249 10*3/uL (ref 150–400)
RBC: 4.19 MIL/uL (ref 3.87–5.11)
RDW: 13.2 % (ref 11.5–15.5)
WBC: 7.6 10*3/uL (ref 4.0–10.5)

## 2016-10-04 LAB — CBG MONITORING, ED: Glucose-Capillary: 132 mg/dL — ABNORMAL HIGH (ref 65–99)

## 2016-10-04 MED ORDER — DIPHENHYDRAMINE HCL 50 MG/ML IJ SOLN
25.0000 mg | Freq: Once | INTRAMUSCULAR | Status: AC
  Administered 2016-10-04: 25 mg via INTRAVENOUS
  Filled 2016-10-04: qty 1

## 2016-10-04 MED ORDER — METOCLOPRAMIDE HCL 5 MG/ML IJ SOLN
10.0000 mg | Freq: Once | INTRAMUSCULAR | Status: AC
  Administered 2016-10-04: 10 mg via INTRAVENOUS
  Filled 2016-10-04: qty 2

## 2016-10-04 MED ORDER — SODIUM CHLORIDE 0.9 % IV BOLUS (SEPSIS)
1000.0000 mL | Freq: Once | INTRAVENOUS | Status: AC
  Administered 2016-10-04: 1000 mL via INTRAVENOUS

## 2016-10-04 MED ORDER — MORPHINE SULFATE (PF) 4 MG/ML IV SOLN
4.0000 mg | Freq: Once | INTRAVENOUS | Status: AC
  Administered 2016-10-04: 4 mg via INTRAVENOUS
  Filled 2016-10-04: qty 1

## 2016-10-04 MED ORDER — LORAZEPAM 2 MG/ML IJ SOLN
0.5000 mg | Freq: Once | INTRAMUSCULAR | Status: AC
  Administered 2016-10-04: 0.5 mg via INTRAVENOUS
  Filled 2016-10-04: qty 1

## 2016-10-04 NOTE — ED Notes (Signed)
Pt having full body shakes (legs kicking and arms flailing) lasting about a minute. Pt somewhat disoriented after seizure-like activity but able to answer questions. Dr. Wilson Singer notified and medications given as ordered. Will continue to monitor. Family remains at bedside.

## 2016-10-04 NOTE — ED Triage Notes (Signed)
Pt reports history of epilepsy. Pt reports last seizure before today was on Saturday. Pt reports headaches,fatigue for last several days and reports seizures x4 today.

## 2016-10-04 NOTE — ED Provider Notes (Signed)
[facility] DEPT Provider Note   CSN: [id number] Arrival date & time: 10/04/16  1837  By signing my name below, I, Dora Sims, attest that this documentation has been prepared under the direction and in the presence of physician practitioner, Virgel Manifold, MD. Electronically Signed: Dora Sims, Scribe. 10/04/2016. 7:08 PM.  History   Chief Complaint Chief Complaint  Patient presents with  . Seizures   The history is provided by the patient. No language interpreter was used.    HPI Comments: [name] is a 51 y.o. female with PMHx of epilepsy and pseudoseizures who presents to the Emergency Department for evaluation of a seizure that occurred shortly prior to arrival. She states she developed a severe headache with nausea earlier today. Patient had a minutes-long jerking episode consistent with prior seizures about one hour ago. She states that her headache is severe currently and she feels fatigued. Her Topamax dosage was recently increased; she was mistakenly taking less than prescribed at first but has taken the prescribed dosage over the last several days. Patient states that she typically sleeps soundly from about midnight to 715 AM. She began having seizures last September and was formally diagnosed with epilepsy two months later. Patient denies vomiting, focal weakness, numbness/tingling, or any other associated symptoms.  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 Patient was strongly encouraged to follow-up with psychiatry.  Past Medical History:  Diagnosis Date  . Anxiety   . Depression   . Epilepsy ([place])   . Hypertension   . Rosacea   . Seizures [facility])    [facility] (Dr. 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 Seizures ([place])   .  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 Jerking movements of extremities 11/11/2015  . Major depressive disorder, recurrent severe without psychotic features ([place])   . Bipolar II disorder ([place]) 08/30/2015  . Suicide attempt by drug ingestion ([place]) 08/30/2015  . Suicidal ideation 08/30/2015  . Migraines 09/29/2014  . Migraine 05/19/2013  . Left-sided weakness 05/18/2013  . Numbness and tingling of left arm and leg 05/18/2013  . CVA (cerebral infarction) 05/18/2013  . Hypertension   . Anxiety     Past Surgical History:  Procedure Laterality Date  . ABDOMINAL HYSTERECTOMY     non cancerous  . APPENDECTOMY    . appendectomy    . BACK SURGERY    . CHOLECYSTECTOMY    . CHOLECYSTECTOMY    . lumbar     ruptured disc in lumbar taken out  . ROTATOR CUFF REPAIR Left   . TONSILLECTOMY      OB History    Gravida Para Term Preterm AB Living   2         2   SAB TAB Ectopic Multiple Live Births                   Home Medications    Prior to Admission medications   Medication Sig Start Date End Date Taking? Authorizing Provider  citalopram (CELEXA) 20 MG tablet TAKE ONE-HALF TABLET BY MOUTH ONCE DAILY 05/25/16   [provider]  hydrOXYzine (ATARAX/VISTARIL) 50 MG tablet Take 1 tablet (50 mg total) by mouth every 4 (four) hours as needed for itching. 09/03/15   Pucilowska, Herma Ard B, MD  levETIRAcetam (KEPPRA) 1000 MG tablet Take  1,000 mg by mouth 2 (two) times daily. 05/18/16   [provider]  LORazepam (ATIVAN) 0.5 MG tablet Take 1 tablet (0.5 mg total) by mouth every 8 (eight) hours as needed for anxiety. 11/06/15 11/05/16  Earleen Newport, MD  phenytoin (DILANTIN) 100 MG ER capsule Take 100 mg by mouth 3 (three) times daily.    [provider]  promethazine (PHENERGAN) 25 MG tablet Take 1 tablet (25 mg total) by mouth every 6 (six) hours as needed for nausea. 06/01/16   Davonna Belling, MD  propranolol (INDERAL) 20 MG tablet take one tablet by mouth three times  daily 05/18/16   [provider]  topiramate (TOPAMAX) 50 MG tablet TAKE ONE TABLET BY MOUTH ONCE DAILY IN THE MORNING AND TWO TABLETS EVERY DAY IN THE EVENING 02/16/16   [provider]  traZODone (DESYREL) 100 MG tablet Take 1 tablet (100 mg total) by mouth at bedtime. Patient taking differently: Take 50-100 mg by mouth at bedtime as needed for sleep.  09/03/15   Clovis Fredrickson, MD    Family History Family History  Problem Relation Age of Onset  . Diabetes Father   . Heart disease Father 46  . Hypertension Father   . Cancer Maternal Grandfather        colon cancer (70's)  . Diabetes Paternal Grandmother   . Diabetes Paternal Grandfather   . Cancer Cousin        breast  . Breast cancer Cousin   . Breast cancer Paternal Aunt   . Breast cancer Cousin   . Breast cancer Cousin     Social History Social History  Substance Use Topics  . Smoking status: Former Research scientist (life sciences)  . Smokeless tobacco: Never Used  . Alcohol use No     Comment: occassionally     Allergies   Toradol [ketorolac tromethamine]; Zofran [ondansetron hcl]; Lamotrigine; and Penicillins   Review of Systems Review of Systems  All other systems reviewed and are negative.  Physical Exam Updated Vital Signs BP 121/68 (BP Location: Right Arm)   Pulse 64   Temp 98.1 F (36.7 C) (Oral)   Resp 16   SpO2 97%   Physical Exam  Constitutional: She appears well-developed and well-nourished.  HENT:  Head: Normocephalic.  Right Ear: External ear normal.  Left Ear: External ear normal.  Nose: Nose normal.  Mouth/Throat: Oropharynx is clear and moist.  Eyes: Conjunctivae are normal. Right eye exhibits no discharge. Left eye exhibits no discharge.  Mild left eye ptosis.  Neck: Normal range of motion.  Cardiovascular: Normal rate, regular rhythm and normal heart sounds.   No murmur heard. Pulmonary/Chest: Effort normal and breath sounds normal. No respiratory distress. She has no wheezes. She  has no rales.  Abdominal: Soft. She exhibits no distension. There is no tenderness. There is no rebound and no guarding.  Musculoskeletal: Normal range of motion. She exhibits no edema or tenderness.  Neurological: She is alert. She has normal strength. No cranial nerve deficit or sensory deficit. Coordination normal.  Skin: Skin is warm and dry. No rash noted. No erythema. No pallor.  Psychiatric: She has a normal mood and affect. Her behavior is normal.  Nursing note and vitals reviewed.  ED Treatments / Results  Labs (all labs ordered are listed, but only abnormal results are displayed) Labs Reviewed  CBG MONITORING, ED - Abnormal; Notable for the following:       Result Value   Glucose-Capillary 132 (*)    All  other components within normal limits    EKG  EKG Interpretation None       Radiology No results found.  Procedures Procedures (including critical care time)  DIAGNOSTIC STUDIES: Oxygen Saturation is 97% on RA, normal by my interpretation.    COORDINATION OF CARE: 7:05 PM Discussed treatment plan with pt at bedside and pt agreed to plan.  Medications Ordered in ED Medications - No data to display   Initial Impression / Assessment and Plan / ED Course  I have reviewed the triage vital signs and the nursing notes.  Pertinent labs & imaging results that were available during my care of the patient were reviewed by me and considered in my medical decision making (see chart for details).     50yF with what I suspect are pseudoseizures. Regardless, now back to baseline. ED w/u fairly unremarkable. No driving for 6 months "seizure" free. Neurology FU.   Final Clinical Impressions(s) / ED Diagnoses   Final diagnoses:  Seizure-like activity ([place])    New Prescriptions New Prescriptions   No medications on file   I personally preformed the services scribed in my presence. The recorded information has been reviewed is accurate. Virgel Manifold, MD.    Virgel Manifold, MD 10/22/16 1140

## 2016-10-04 NOTE — Discharge Instructions (Signed)
Follow-up with your neurologist next week as scheduled.

## 2016-10-16 ENCOUNTER — Other Ambulatory Visit: Payer: Self-pay | Admitting: Specialist

## 2016-10-16 DIAGNOSIS — R569 Unspecified convulsions: Secondary | ICD-10-CM

## 2016-10-27 ENCOUNTER — Ambulatory Visit
Admission: RE | Admit: 2016-10-27 | Discharge: 2016-10-27 | Disposition: A | Discharge status: Home / Self Care | Payer: No Typology Code available for payment source | Source: Ambulatory Visit | Attending: Specialist | Admitting: Specialist

## 2016-10-27 DIAGNOSIS — R569 Unspecified convulsions: Secondary | ICD-10-CM

## 2017-03-07 ENCOUNTER — Other Ambulatory Visit: Payer: Self-pay

## 2017-03-07 ENCOUNTER — Encounter (HOSPITAL_COMMUNITY): Payer: Self-pay | Admitting: Emergency Medicine

## 2017-03-07 DIAGNOSIS — Z87891 Personal history of nicotine dependence: Secondary | ICD-10-CM | POA: Insufficient documentation

## 2017-03-07 DIAGNOSIS — R569 Unspecified convulsions: Secondary | ICD-10-CM | POA: Insufficient documentation

## 2017-03-07 DIAGNOSIS — Z8673 Personal history of transient ischemic attack (TIA), and cerebral infarction without residual deficits: Secondary | ICD-10-CM | POA: Insufficient documentation

## 2017-03-07 DIAGNOSIS — I10 Essential (primary) hypertension: Secondary | ICD-10-CM | POA: Insufficient documentation

## 2017-03-07 LAB — BASIC METABOLIC PANEL
Anion gap: 12 (ref 5–15)
BUN: 9 mg/dL (ref 6–20)
CHLORIDE: 107 mmol/L (ref 101–111)
CO2: 23 mmol/L (ref 22–32)
Calcium: 9.5 mg/dL (ref 8.9–10.3)
Creatinine, Ser: 0.66 mg/dL (ref 0.44–1.00)
GFR calc Af Amer: >60 mL/min (ref >60)
GFR calc non Af Amer: >60 mL/min (ref >60)
GLUCOSE: 120 mg/dL — AB (ref 65–99)
POTASSIUM: 3.7 mmol/L (ref 3.5–5.1)
Sodium: 142 mmol/L (ref 135–145)

## 2017-03-07 LAB — CBC
HEMATOCRIT: 40.9 % (ref 36.0–46.0)
Hemoglobin: 13.4 g/dL (ref 12.0–15.0)
MCH: 30.2 pg (ref 26.0–34.0)
MCHC: 32.8 g/dL (ref 30.0–36.0)
MCV: 92.3 fL (ref 78.0–100.0)
Platelets: 248 10*3/uL (ref 150–400)
RBC: 4.43 MIL/uL (ref 3.87–5.11)
RDW: 13.1 % (ref 11.5–15.5)
WBC: 8.5 10*3/uL (ref 4.0–10.5)

## 2017-03-07 LAB — PHENYTOIN LEVEL, TOTAL: Phenytoin Lvl: 2.6 ug/mL — ABNORMAL LOW (ref 10.0–20.0)

## 2017-03-07 NOTE — ED Triage Notes (Signed)
Pt states she has had 2 seizures today, has been nauseated the last couple of days and also c/o severe headache.

## 2017-03-08 ENCOUNTER — Emergency Department (HOSPITAL_COMMUNITY)
Admission: EM | Admit: 2017-03-08 | Discharge: 2017-03-08 | Disposition: A | Discharge status: Home / Self Care | Payer: Self-pay | Attending: Emergency Medicine | Admitting: Emergency Medicine

## 2017-03-08 DIAGNOSIS — R569 Unspecified convulsions: Secondary | ICD-10-CM

## 2017-03-08 MED ORDER — PHENYTOIN SODIUM EXTENDED 100 MG PO CAPS
100.0000 mg | ORAL_CAPSULE | Freq: Once | ORAL | Status: AC
  Administered 2017-03-08: 100 mg via ORAL
  Filled 2017-03-08: qty 1

## 2017-03-08 MED ORDER — FENTANYL CITRATE (PF) 100 MCG/2ML IJ SOLN
50.0000 ug | Freq: Once | INTRAMUSCULAR | Status: AC
  Administered 2017-03-08: 50 ug via INTRAVENOUS
  Filled 2017-03-08: qty 2

## 2017-03-08 MED ORDER — SODIUM CHLORIDE 0.9 % IV SOLN
500.0000 mg | Freq: Once | INTRAVENOUS | Status: AC
  Administered 2017-03-08: 500 mg via INTRAVENOUS
  Filled 2017-03-08: qty 10

## 2017-03-08 MED ORDER — PROMETHAZINE HCL 25 MG RE SUPP: 25.0000 mg | Freq: Four times a day (QID) | RECTAL | 0 refills | Status: DC | PRN

## 2017-03-08 MED ORDER — PHENYTOIN SODIUM 50 MG/ML IJ SOLN
INTRAMUSCULAR | Status: AC
  Filled 2017-03-08: qty 10

## 2017-03-08 MED ORDER — PROCHLORPERAZINE EDISYLATE 5 MG/ML IJ SOLN
5.0000 mg | Freq: Once | INTRAMUSCULAR | Status: AC
  Administered 2017-03-08: 5 mg via INTRAVENOUS
  Filled 2017-03-08: qty 2

## 2017-03-08 NOTE — Discharge Instructions (Signed)
Your Dilantin level is low.  Please start using your Dilantin 3 times daily as directed.  Continue your current medications as directed.  Please see Dr. Dennard Schaumann in the next 2 or 3 days to have your levels rechecked so that seizure activity can be prevented as much as possible. Use phenergan suppositories every 6 hours per rectum for nausea/vomiting.This medication may cause drowsiness. Please do not drink, drive, or participate in activity that requires concentration while taking this medication.  Please return to the emergency department if any emergent changes, problems, or concerns.

## 2017-03-08 NOTE — ED Provider Notes (Addendum)
[facility] EMERGENCY DEPARTMENT Provider Note   CSN: [id number] Arrival date & time: 03/07/17  2132     History   Chief Complaint Chief Complaint  Patient presents with  . Seizures    HPI [name] is a 52 y.o. female.  Patient is a 52 year old female who presents to the emergency department with a complaint of seizures.  Patient and her husband give history that the patient has had 3 seizures today.  The last seizure being around 330 this afternoon.  The husband states that the postictal period seemed to be longer today than usual.  The patient was confused for an extended period of time according to the husband.  The patient states that she took her Dilantin this morning, but has not had her evening dose.  She also has not had her Tegretol and Celexa.   The history is provided by the patient.    Past Medical History:  Diagnosis Date  . Anxiety   . Depression   . Epilepsy ([place])   . Hypertension   . Rosacea   . Seizures [facility])    [facility] (Dr. 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 Seizures ([place])   . 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 Jerking movements of extremities 11/11/2015  . Major depressive disorder, recurrent severe without psychotic features ([place])   . Bipolar II disorder ([place]) 08/30/2015  . Suicide attempt by drug ingestion ([place]) 08/30/2015  . Suicidal ideation 08/30/2015  . Migraines 09/29/2014  . Migraine 05/19/2013  . Left-sided weakness 05/18/2013  . Numbness and tingling of left arm and leg 05/18/2013  . CVA (cerebral infarction) 05/18/2013  . Hypertension   . Anxiety     Past Surgical History:  Procedure Laterality Date  . ABDOMINAL HYSTERECTOMY     non cancerous  . APPENDECTOMY    . appendectomy    . BACK SURGERY    . CHOLECYSTECTOMY    . CHOLECYSTECTOMY    . lumbar     ruptured disc in lumbar taken out  . ROTATOR  CUFF REPAIR Left   . TONSILLECTOMY      OB History    Gravida Para Term Preterm AB Living   2         2   SAB TAB Ectopic Multiple Live Births                   Home Medications    Prior to Admission medications   Medication Sig Start Date End Date Taking? Authorizing Provider  citalopram (CELEXA) 20 MG tablet TAKE 40MG  BY MOUTH ONCE DAILY 05/25/16   [provider]  Cyanocobalamin (B-12 PO) Take 1 tablet by mouth 2 (two) times daily.    [provider]  hydrOXYzine (ATARAX/VISTARIL) 50 MG tablet Take 1 tablet (50 mg total) by mouth every 4 (four) hours as needed for itching. 09/03/15   Pucilowska, Jolanta B, MD  methylphenidate (RITALIN) 10 MG tablet Take 10 mg by mouth 2 (two) times daily.    [provider]  phenytoin (DILANTIN) 100 MG ER capsule Take 100 mg by mouth 3 (three) times daily.    [provider]  promethazine (PHENERGAN) 25 MG tablet Take 1 tablet (25 mg total) by mouth every 6 (six) hours as needed for nausea. Patient not taking: Reported on 10/04/2016 06/01/16   Davonna Belling, MD  propranolol (INDERAL) 20 MG tablet take  one tablet by mouth three times daily 05/18/16   [provider]  topiramate (TOPAMAX) 100 MG tablet Take 100-200 mg by mouth 2 (two) times daily. 100mg  in the morning and 200mg  in the evening    [provider]    Family History Family History  Problem Relation Age of Onset  . Diabetes Father   . Heart disease Father 50  . Hypertension Father   . Cancer Maternal Grandfather        colon cancer (70's)  . Diabetes Paternal Grandmother   . Diabetes Paternal Grandfather   . Cancer Cousin        breast  . Breast cancer Cousin   . Breast cancer Paternal Aunt   . Breast cancer Cousin   . Breast cancer Cousin     Social History Social History   Tobacco Use  . Smoking status: Former Research scientist (life sciences)  . Smokeless tobacco: Never Used  Substance Use Topics  . Alcohol use: No    Alcohol/week: 0.0 oz      Comment: occassionally  . Drug use: No     Allergies   Toradol [ketorolac tromethamine]; Zofran [ondansetron hcl]; Lamotrigine; and Penicillins   Review of Systems Review of Systems  Constitutional: Negative for activity change.       All ROS Neg except as noted in HPI  HENT: Negative for nosebleeds.   Eyes: Negative for photophobia and discharge.  Respiratory: Negative for cough, shortness of breath and wheezing.   Cardiovascular: Negative for chest pain and palpitations.  Gastrointestinal: Positive for nausea. Negative for abdominal pain and blood in stool.  Genitourinary: Negative for dysuria, frequency and hematuria.  Musculoskeletal: Negative for arthralgias, back pain and neck pain.  Skin: Negative.   Neurological: Positive for seizures and headaches. Negative for dizziness and speech difficulty.  Psychiatric/Behavioral: Negative for confusion and hallucinations.     Physical Exam Updated Vital Signs BP (!) 139/104   Pulse 65   Temp 97.9 F (36.6 C)   Resp 20   Ht 5\' 9"  (1.753 m)   Wt 95.3 kg (210 lb)   SpO2 100%   BMI 31.01 kg/m   Physical Exam  Constitutional: She appears well-developed and well-nourished. No distress.  HENT:  Head: Normocephalic and atraumatic.  Right Ear: External ear normal.  Left Ear: External ear normal.  Eyes: Conjunctivae are normal. Right eye exhibits no discharge. Left eye exhibits no discharge. No scleral icterus.  Neck: Neck supple. No tracheal deviation present.  Cardiovascular: Normal rate, regular rhythm and intact distal pulses.  Pulmonary/Chest: Effort normal and breath sounds normal. No stridor. No respiratory distress. She has no wheezes. She has no rales.  Abdominal: Soft. Bowel sounds are normal. She exhibits no distension. There is no tenderness. There is no rebound and no guarding.  Musculoskeletal: She exhibits no edema or tenderness.  Neurological: She is alert. She has normal strength. No cranial nerve deficit  (no facial droop, extraocular movements intact, no slurred speech) or sensory deficit. She exhibits normal muscle tone. She displays no seizure activity. Coordination normal.  Skin: Skin is warm and dry. No rash noted.  Psychiatric: Her mood appears anxious.  Nursing note and vitals reviewed.    ED Treatments / Results  Labs (all labs ordered are listed, but only abnormal results are displayed) Labs Reviewed  PHENYTOIN LEVEL, TOTAL - Abnormal; Notable for the following components:      Result Value   Phenytoin Lvl 2.6 (*)    All other components within normal  limits  BASIC METABOLIC PANEL - Abnormal; Notable for the following components:   Glucose, Bld 120 (*)    All other components within normal limits  CBC    EKG  EKG Interpretation None       Radiology No results found.  Procedures Procedures (including critical care time)  Medications Ordered in ED Medications - No data to display   Initial Impression / Assessment and Plan / ED Course  I have reviewed the triage vital signs and the nursing notes.  Pertinent labs & imaging results that were available during my care of the patient were reviewed by me and considered in my medical decision making (see chart for details).       Final Clinical Impressions(s) / ED Diagnoses MDM According to the patient and the patient's husband the patient had 3 seizures today.  She seemed to take longer to get back to her baseline than usual.  Husband states she had not done this since earlier last year.  No seizure activity since being in the emergency department.  I discussed the patient's medications with the family.  The patient states that she takes Tegretol twice daily.  She ran out today.  She takes Dilantin 100 mg, but has been told to take it 3 times daily and she is taking it 2 times daily.  She also takes Celexa, but did not get today's dose.  Dilantin level is down to 2.6.  Oral and IV Dilantin given to the patient.   Basic metabolic panel is within normal limits.  The anion gap is 15.  Within normal limits.  The complete blood count is well within normal limits.  The patient will be observed while receiving the IV Dilantin slowly.  If no seizure activity, plan for the patient to be discharged home.  I have emphasized to the patient the importance of taking the medicines as directed, and also to see Dr. Dennard Schaumann to have her levels checked within the next 2 or 3 days.   Final diagnoses:  Seizures [facility])    ED Discharge Orders    None       Lily Kocher, PA-C 03/08/17 0231    Orpah Greek, MD 03/08/17 0251    Lily Kocher, PA-C 04/11/17 5885    Orpah Greek, MD 04/12/17 [id number]

## 2017-05-10 DIAGNOSIS — Z915 Personal history of self-harm: Secondary | ICD-10-CM | POA: Diagnosis not present

## 2017-05-10 DIAGNOSIS — F419 Anxiety disorder, unspecified: Secondary | ICD-10-CM | POA: Diagnosis not present

## 2017-05-10 DIAGNOSIS — R569 Unspecified convulsions: Secondary | ICD-10-CM | POA: Diagnosis not present

## 2017-05-10 DIAGNOSIS — F445 Conversion disorder with seizures or convulsions: Secondary | ICD-10-CM | POA: Diagnosis not present

## 2017-05-10 DIAGNOSIS — G43909 Migraine, unspecified, not intractable, without status migrainosus: Secondary | ICD-10-CM | POA: Diagnosis not present

## 2017-05-10 DIAGNOSIS — F329 Major depressive disorder, single episode, unspecified: Secondary | ICD-10-CM | POA: Diagnosis not present

## 2017-06-18 DIAGNOSIS — F445 Conversion disorder with seizures or convulsions: Secondary | ICD-10-CM | POA: Diagnosis not present

## 2017-06-18 DIAGNOSIS — G43019 Migraine without aura, intractable, without status migrainosus: Secondary | ICD-10-CM | POA: Diagnosis not present

## 2017-06-20 DIAGNOSIS — F329 Major depressive disorder, single episode, unspecified: Secondary | ICD-10-CM | POA: Diagnosis not present

## 2017-07-11 ENCOUNTER — Ambulatory Visit: Payer: BLUE CROSS/BLUE SHIELD | Admitting: Family Medicine

## 2017-07-11 ENCOUNTER — Encounter: Payer: Self-pay | Admitting: Family Medicine

## 2017-07-11 ENCOUNTER — Ambulatory Visit (HOSPITAL_COMMUNITY)
Admission: RE | Admit: 2017-07-11 | Discharge: 2017-07-11 | Disposition: A | Discharge status: Home / Self Care | Payer: BLUE CROSS/BLUE SHIELD | Source: Ambulatory Visit | Attending: Family Medicine | Admitting: Family Medicine

## 2017-07-11 ENCOUNTER — Other Ambulatory Visit: Payer: Self-pay

## 2017-07-11 VITALS — BP 122/80 | HR 72 | Temp 98.6°F | Resp 12 | Ht 66.5 in | Wt 218.0 lb

## 2017-07-11 DIAGNOSIS — M25562 Pain in left knee: Secondary | ICD-10-CM | POA: Insufficient documentation

## 2017-07-11 DIAGNOSIS — S8992XA Unspecified injury of left lower leg, initial encounter: Secondary | ICD-10-CM | POA: Diagnosis not present

## 2017-07-11 DIAGNOSIS — M7989 Other specified soft tissue disorders: Secondary | ICD-10-CM | POA: Diagnosis not present

## 2017-07-11 MED ORDER — NAPROXEN 500 MG PO TABS
500.0000 mg | ORAL_TABLET | Freq: Two times a day (BID) | ORAL | 2 refills | Status: DC
Start: 2017-07-11 — End: 2017-08-28

## 2017-07-11 NOTE — Progress Notes (Signed)
   Subjective:    Patient ID: [name], female    DOB: [birth year], 52 y.o.   MRN: [id number]  Patient presents for L Knee Pain (x2-3 months- pain around patella when bending) Left knee pain for 2 months, no specific injury. When she gets up and tries to walk/bend knee, she has pain, she thinks knees twist in when she walks [place] shooting pain over patella and anterior knee +swelling      Review Of Systems:  GEN- denies fatigue, fever, weight loss,weakness, recent illness HEENT- denies eye drainage, change in vision, nasal discharge, CVS- denies chest pain, palpitations RESP- denies SOB, cough, wheeze ABD- denies N/V, change in stools, abd pain GU- denies dysuria, hematuria, dribbling, incontinence MSK- + joint pain, muscle aches, injury Neuro- denies headache, dizziness, syncope, seizure activity       Objective:    BP 122/80   Pulse 72   Temp 98.6 F (37 C) (Oral)   Resp 12   Ht 5' 6.5" (1.689 m)   Wt 218 lb (98.9 kg)   SpO2 100%   BMI 34.66 kg/m  GEN- NAD, alert and oriented x3 MSK- Right knee FROM, no effusion, Left knee, mild swelling inferior, fair ROM, pain with extension and flexion, mild crepitus, ligaments in tact Ext- no edema Pulse- DP 2+        Assessment & Plan:      Problem List Items Addressed This Visit    None    Visit Diagnoses    Acute pain of left knee    -  Primary   possible OA knee, unclear if injury in past, obtain xray of knee, given naprosyn BID, referral to orthopedics after xrays   Relevant Orders   DG Knee Complete 4 Views Left      Note: This dictation was prepared with Dragon dictation along with smaller phrase technology. Any transcriptional errors that result from this process are unintentional.

## 2017-07-11 NOTE — Patient Instructions (Signed)
Take naprosyn twice a day  Referral to orthopedics Get the xray  F/U as needed

## 2017-07-12 DIAGNOSIS — F4310 Post-traumatic stress disorder, unspecified: Secondary | ICD-10-CM | POA: Diagnosis not present

## 2017-07-13 ENCOUNTER — Other Ambulatory Visit: Payer: Self-pay | Admitting: *Deleted

## 2017-07-13 DIAGNOSIS — M25562 Pain in left knee: Secondary | ICD-10-CM

## 2017-07-13 DIAGNOSIS — M7052 Other bursitis of knee, left knee: Secondary | ICD-10-CM

## 2017-07-25 DIAGNOSIS — F4310 Post-traumatic stress disorder, unspecified: Secondary | ICD-10-CM | POA: Diagnosis not present

## 2017-07-26 DIAGNOSIS — M25562 Pain in left knee: Secondary | ICD-10-CM | POA: Insufficient documentation

## 2017-08-09 ENCOUNTER — Other Ambulatory Visit: Payer: Self-pay | Admitting: Family Medicine

## 2017-08-15 DIAGNOSIS — F4310 Post-traumatic stress disorder, unspecified: Secondary | ICD-10-CM | POA: Diagnosis not present

## 2017-08-27 ENCOUNTER — Emergency Department (HOSPITAL_COMMUNITY)
Admission: EM | Admit: 2017-08-27 | Discharge: 2017-08-28 | Disposition: A | Discharge status: Home / Self Care | Payer: BLUE CROSS/BLUE SHIELD | Attending: Emergency Medicine | Admitting: Emergency Medicine

## 2017-08-27 ENCOUNTER — Encounter (HOSPITAL_COMMUNITY): Payer: Self-pay

## 2017-08-27 ENCOUNTER — Emergency Department (HOSPITAL_COMMUNITY): Payer: BLUE CROSS/BLUE SHIELD

## 2017-08-27 DIAGNOSIS — M5489 Other dorsalgia: Secondary | ICD-10-CM | POA: Insufficient documentation

## 2017-08-27 DIAGNOSIS — Z79899 Other long term (current) drug therapy: Secondary | ICD-10-CM | POA: Insufficient documentation

## 2017-08-27 DIAGNOSIS — Z87891 Personal history of nicotine dependence: Secondary | ICD-10-CM | POA: Insufficient documentation

## 2017-08-27 DIAGNOSIS — I10 Essential (primary) hypertension: Secondary | ICD-10-CM | POA: Diagnosis not present

## 2017-08-27 DIAGNOSIS — R079 Chest pain, unspecified: Secondary | ICD-10-CM | POA: Diagnosis not present

## 2017-08-27 DIAGNOSIS — R0789 Other chest pain: Secondary | ICD-10-CM | POA: Diagnosis not present

## 2017-08-27 LAB — CBC
HEMATOCRIT: 40.3 % (ref 36.0–46.0)
Hemoglobin: 13.1 g/dL (ref 12.0–15.0)
MCH: 30 pg (ref 26.0–34.0)
MCHC: 32.5 g/dL (ref 30.0–36.0)
MCV: 92.2 fL (ref 78.0–100.0)
PLATELETS: 273 10*3/uL (ref 150–400)
RBC: 4.37 MIL/uL (ref 3.87–5.11)
RDW: 12.9 % (ref 11.5–15.5)
WBC: 7.6 10*3/uL (ref 4.0–10.5)

## 2017-08-27 LAB — BASIC METABOLIC PANEL
Anion gap: 9 (ref 5–15)
BUN: 11 mg/dL (ref 6–20)
CHLORIDE: 108 mmol/L (ref 101–111)
CO2: 23 mmol/L (ref 22–32)
CREATININE: 0.63 mg/dL (ref 0.44–1.00)
Calcium: 8.9 mg/dL (ref 8.9–10.3)
GFR calc Af Amer: >60 mL/min (ref >60)
GFR calc non Af Amer: >60 mL/min (ref >60)
GLUCOSE: 94 mg/dL (ref 65–99)
POTASSIUM: 3.6 mmol/L (ref 3.5–5.1)
SODIUM: 140 mmol/L (ref 135–145)

## 2017-08-27 LAB — TROPONIN I: Troponin I: <0.03 ng/mL (ref <0.03)

## 2017-08-27 LAB — CK: Total CK: 25 U/L — ABNORMAL LOW (ref 38–234)

## 2017-08-27 MED ORDER — METOCLOPRAMIDE HCL 5 MG/ML IJ SOLN
10.0000 mg | Freq: Once | INTRAMUSCULAR | Status: AC
  Administered 2017-08-27: 10 mg via INTRAVENOUS
  Filled 2017-08-27: qty 2

## 2017-08-27 MED ORDER — DEXAMETHASONE SODIUM PHOSPHATE 4 MG/ML IJ SOLN
10.0000 mg | Freq: Once | INTRAMUSCULAR | Status: AC
  Administered 2017-08-27: 10 mg via INTRAVENOUS
  Filled 2017-08-27: qty 3

## 2017-08-27 MED ORDER — SODIUM CHLORIDE 0.9 % IV BOLUS
1000.0000 mL | Freq: Once | INTRAVENOUS | Status: AC
  Administered 2017-08-27: 1000 mL via INTRAVENOUS

## 2017-08-27 MED ORDER — DIPHENHYDRAMINE HCL 50 MG/ML IJ SOLN
25.0000 mg | Freq: Once | INTRAMUSCULAR | Status: AC
  Administered 2017-08-27: 25 mg via INTRAVENOUS
  Filled 2017-08-27: qty 1

## 2017-08-27 NOTE — ED Triage Notes (Signed)
Pt reports she had seizure on Friday and she woke up Saturday not feeling well. Pt reports that her back began hurting first and then chest began Saturday night  Also reports migraine. Also reports being hot and then cold. Reports nausea, but no vomiting

## 2017-08-28 LAB — D-DIMER, QUANTITATIVE: D-Dimer, Quant: <0.27 ug/mL-FEU (ref 0.00–0.50)

## 2017-08-28 LAB — TROPONIN I: Troponin I: <0.03 ng/mL (ref <0.03)

## 2017-08-28 MED ORDER — NAPROXEN 500 MG PO TABS: 500.0000 mg | ORAL_TABLET | Freq: Two times a day (BID) | ORAL | 0 refills | Status: DC

## 2017-08-28 NOTE — ED Provider Notes (Signed)
[facility] EMERGENCY DEPARTMENT Provider Note   CSN: [id number] Arrival date & time: 08/27/17  1630     History   Chief Complaint Chief Complaint  Patient presents with  . Chest Pain  . Back Pain    HPI [name] is a 52 y.o. female.  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  She reports the pain started after she had a seizure on Friday and she woke up with pain across her upper back and shoulders and general sensation of not feeling well on Saturday.  She reports the back pain is constant.  Denies injuring it during a seizure.  Denies hitting her head.  She been taking ibuprofen at home without relief.  She has not had this kind of pain in the past.  Is not worse with palpation or movement of her upper extremities.  Today she noticed the pain was radiating to her chest and lasting for several seconds to minutes at a time coming and going.  It is not exertional or pleuritic.  She has a history of low back pain but is never had upper back pain in the past. She has an ongoing "migraine" that is typical of her headaches and is not sudden onset.  No focal weakness, numbness or tingling.  She takes Topamax at home for her seizures and headaches.  The history is provided by the patient.  Chest Pain   Associated symptoms include back pain and headaches. Pertinent negatives include no abdominal pain, no dizziness, no fever, no shortness of breath, no vomiting and no weakness.  Back Pain   Associated symptoms include chest pain and headaches. Pertinent negatives include no fever, no abdominal pain, no dysuria and no weakness.    Past Medical History:  Diagnosis Date  . Anxiety   . Depression   . Epilepsy ([place])   . Hypertension   . Rosacea   . Seizures [facility])    [facility] (Dr. 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  on eeg    Patient Active Problem List   Diagnosis Date Noted  . Seizures ([place])   . 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 Jerking movements of extremities 11/11/2015  . Major depressive disorder, recurrent severe without psychotic features ([place])   . Bipolar II disorder ([place]) 08/30/2015  . Suicide attempt by drug ingestion ([place]) 08/30/2015  . Suicidal ideation 08/30/2015  . Migraines 09/29/2014  . Migraine 05/19/2013  . Left-sided weakness 05/18/2013  . Numbness and tingling of left arm and leg 05/18/2013  . CVA (cerebral infarction) 05/18/2013  . Hypertension   . Anxiety     Past Surgical History:  Procedure Laterality Date  . ABDOMINAL HYSTERECTOMY     non cancerous  . APPENDECTOMY    . appendectomy    . BACK SURGERY    . CHOLECYSTECTOMY    . CHOLECYSTECTOMY    . lumbar     ruptured disc in lumbar taken out  . ROTATOR CUFF REPAIR Left   . TONSILLECTOMY       OB History    Gravida  2   Para      Term      Preterm      AB      Living  2     SAB      TAB      Ectopic  Multiple      Live Births               Home Medications    Prior to Admission medications   Medication Sig Start Date End Date Taking? Authorizing Provider  citalopram (CELEXA) 20 MG tablet TAKE 40MG  BY MOUTH ONCE DAILY 05/25/16   [provider]  Cyanocobalamin (B-12 PO) Take 1 tablet by mouth 2 (two) times daily.    [provider]  hydrOXYzine (ATARAX/VISTARIL) 50 MG tablet Take 1 tablet (50 mg total) by mouth every 4 (four) hours as needed for itching. 09/03/15   Pucilowska, Jolanta B, MD  methylphenidate (RITALIN) 10 MG tablet Take 10 mg by mouth 2 (two) times daily.    [provider]  naproxen (NAPROSYN) 500 MG tablet Take 1 tablet (500 mg total) by mouth 2 (two) times daily with a meal. 07/11/17   Yulee, Modena Nunnery, MD  phenytoin (DILANTIN) 100 MG ER capsule Take 100 mg by mouth 3 (three) times daily.    [provider]  promethazine (PHENERGAN)  25 MG suppository Place 1 suppository (25 mg total) rectally every 6 (six) hours as needed for nausea or vomiting. 03/08/17   Lily Kocher, PA-C  propranolol (INDERAL) 20 MG tablet take one tablet by mouth daily 05/18/16   [provider]  topiramate (TOPAMAX) 100 MG tablet Take 100-200 mg by mouth 2 (two) times daily. 100mg  in the morning and 200mg  in the evening    [provider]    Family History Family History  Problem Relation Age of Onset  . Diabetes Father   . Heart disease Father 23  . Hypertension Father   . Cancer Maternal Grandfather        colon cancer (70's)  . Diabetes Paternal Grandmother   . Diabetes Paternal Grandfather   . Cancer Cousin        breast  . Breast cancer Cousin   . Breast cancer Paternal Aunt   . Breast cancer Cousin   . Breast cancer Cousin     Social History Social History   Tobacco Use  . Smoking status: Former Research scientist (life sciences)  . Smokeless tobacco: Never Used  Substance Use Topics  . Alcohol use: No    Alcohol/week: 0.0 oz    Comment: occassionally  . Drug use: No     Allergies   Toradol [ketorolac tromethamine]; Zofran [ondansetron hcl]; Lamotrigine; and Penicillins   Review of Systems Review of Systems  Constitutional: Negative for activity change, appetite change and fever.  HENT: Negative for congestion and rhinorrhea.   Eyes: Negative for visual disturbance.  Respiratory: Negative for chest tightness and shortness of breath.   Cardiovascular: Positive for chest pain.  Gastrointestinal: Negative for abdominal pain and vomiting.  Genitourinary: Negative for dysuria, flank pain, hematuria, vaginal bleeding and vaginal discharge.  Musculoskeletal: Positive for arthralgias, back pain and myalgias.  Neurological: Positive for headaches. Negative for dizziness and weakness.   all other systems are negative except as noted in the HPI and PMH.     Physical Exam Updated Vital Signs BP 140/77   Pulse 72   Temp 98 F  (36.7 C) (Oral)   Resp 14   Wt 98.9 kg (218 lb)   SpO2 99%   BMI 34.66 kg/m   Physical Exam  Constitutional: She is oriented to person, place, and time. She appears well-developed and well-nourished. No distress.  HENT:  Head: Normocephalic and atraumatic.  Mouth/Throat: Oropharynx is clear and moist. No oropharyngeal exudate.  Eyes: Pupils are equal, round, and reactive to light. Conjunctivae and EOM are normal.  Neck: Normal range of motion. Neck supple.  No meningismus.  Cardiovascular: Normal rate, regular rhythm, normal heart sounds and intact distal pulses.  No murmur heard. Pulmonary/Chest: Effort normal and breath sounds normal. No respiratory distress. She exhibits tenderness.  Abdominal: Soft. There is no tenderness. There is no rebound and no guarding.  Musculoskeletal: Normal range of motion. She exhibits tenderness. She exhibits no edema.  Paraspinal cervical and thoracic tenderness, no midline tenderness,  Equal upper extremity pulses and grip strengths  Neurological: She is alert and oriented to person, place, and time. No cranial nerve deficit. She exhibits normal muscle tone. Coordination normal.  No ataxia on finger to nose bilaterally. No pronator drift. 5/5 strength throughout. CN 2-12 intact.Equal grip strength. Sensation intact.   Skin: Skin is warm.  Psychiatric: She has a normal mood and affect. Her behavior is normal.  Nursing note and vitals reviewed.    ED Treatments / Results  Labs (all labs ordered are listed, but only abnormal results are displayed) Labs Reviewed  CK - Abnormal; Notable for the following components:      Result Value   Total CK 25 (*)    All other components within normal limits  BASIC METABOLIC PANEL  CBC  TROPONIN I  D-DIMER, QUANTITATIVE (NOT AT [facility])  TROPONIN I    EKG EKG Interpretation  Date/Time:  Monday August 27 2017 17:31:34 EDT Ventricular Rate:  75 PR Interval:  136 QRS Duration: 82 QT Interval:  388 QTC  Calculation: 433 R Axis:   61 Text Interpretation:  Normal sinus rhythm Normal ECG No significant change was found Confirmed by Ezequiel Essex [phone]) on 08/27/2017 11:21:41 PM   Radiology Dg Chest 2 View  Result Date: 08/27/2017 CLINICAL DATA:  Seizure, pain between shoulder blades. EXAM: CHEST - 2 VIEW COMPARISON:  05/31/2016. FINDINGS: The heart size and mediastinal contours are within normal limits. Both lungs are clear. The visualized skeletal structures are unremarkable. IMPRESSION: No active cardiopulmonary disease. No musculoskeletal injury is evident. Electronically Signed   By: Staci Righter M.D.   On: 08/27/2017 18:04    Procedures Procedures (including critical care time)  Medications Ordered in ED Medications  sodium chloride 0.9 % bolus 1,000 mL (1,000 mLs Intravenous New Bag/Given 08/27/17 2349)  metoCLOPramide (REGLAN) injection 10 mg (10 mg Intravenous Given 08/27/17 2348)  diphenhydrAMINE (BENADRYL) injection 25 mg (25 mg Intravenous Given 08/27/17 2348)  dexamethasone (DECADRON) injection 10 mg (10 mg Intravenous Given 08/27/17 2348)     Initial Impression / Assessment and Plan / ED Course  I have reviewed the triage vital signs and the nursing notes.  Pertinent labs & imaging results that were available during my care of the patient were reviewed by me and considered in my medical decision making (see chart for details).    Patient with 2 days of upper back pain that is been constant.  Associated with intermittent chest pain that comes and goes.  EKG is sinus rhythm.  Chest pain is atypical for ACS.  EKG is reassuring.  Low suspicion for ACS or pulmonary embolism.  Troponin negative and d-dimer negative.  Patient given IV fluids, nonnarcotic pain control.  CK is normal. Headache is gradual in onset and typical of previous headaches.  Low suspicion for subarachnoid hemorrhage, meningitis, temporal arteritis  Patient is feeling improved on recheck.  Troponin  negative x2 after ongoing pain for the past several days. Discussed  p.o. hydration at home, anti-inflammatories and PCP follow-up.  Return precautions discussed.  Final Clinical Impressions(s) / ED Diagnoses   Final diagnoses:  Atypical chest pain    ED Discharge Orders    None       Kiely Cousar, Annie Main, MD 08/28/17 [phone]

## 2017-08-28 NOTE — Discharge Instructions (Addendum)
There is no evidence of heart attack or blood clot in the lung.  Take the anti-inflammatory as prescribed and follow-up with your doctor. Return to the ED if you develop new or worsening symptoms.

## 2017-08-28 NOTE — ED Notes (Signed)
Pt ambulatory to waiting room. Pt verbalized understanding of discharge instructions.   

## 2017-09-03 DIAGNOSIS — F4310 Post-traumatic stress disorder, unspecified: Secondary | ICD-10-CM | POA: Diagnosis not present

## 2017-09-03 DIAGNOSIS — F329 Major depressive disorder, single episode, unspecified: Secondary | ICD-10-CM | POA: Diagnosis not present

## 2017-09-03 DIAGNOSIS — F419 Anxiety disorder, unspecified: Secondary | ICD-10-CM | POA: Diagnosis not present

## 2017-09-20 DIAGNOSIS — F4312 Post-traumatic stress disorder, chronic: Secondary | ICD-10-CM | POA: Diagnosis not present

## 2017-10-04 DIAGNOSIS — G43719 Chronic migraine without aura, intractable, without status migrainosus: Secondary | ICD-10-CM | POA: Diagnosis not present

## 2017-10-08 DIAGNOSIS — F329 Major depressive disorder, single episode, unspecified: Secondary | ICD-10-CM | POA: Diagnosis not present

## 2017-10-08 DIAGNOSIS — F419 Anxiety disorder, unspecified: Secondary | ICD-10-CM | POA: Diagnosis not present

## 2017-10-08 DIAGNOSIS — F4310 Post-traumatic stress disorder, unspecified: Secondary | ICD-10-CM | POA: Diagnosis not present

## 2017-10-11 DIAGNOSIS — F4312 Post-traumatic stress disorder, chronic: Secondary | ICD-10-CM | POA: Diagnosis not present

## 2017-10-15 DIAGNOSIS — F445 Conversion disorder with seizures or convulsions: Secondary | ICD-10-CM | POA: Diagnosis not present

## 2017-10-15 DIAGNOSIS — G43019 Migraine without aura, intractable, without status migrainosus: Secondary | ICD-10-CM | POA: Diagnosis not present

## 2017-10-18 DIAGNOSIS — F4312 Post-traumatic stress disorder, chronic: Secondary | ICD-10-CM | POA: Diagnosis not present

## 2017-11-01 DIAGNOSIS — F4312 Post-traumatic stress disorder, chronic: Secondary | ICD-10-CM | POA: Diagnosis not present

## 2017-11-08 DIAGNOSIS — F4312 Post-traumatic stress disorder, chronic: Secondary | ICD-10-CM | POA: Diagnosis not present

## 2017-11-15 DIAGNOSIS — F4312 Post-traumatic stress disorder, chronic: Secondary | ICD-10-CM | POA: Diagnosis not present

## 2017-11-26 ENCOUNTER — Ambulatory Visit: Payer: BLUE CROSS/BLUE SHIELD | Admitting: Family Medicine

## 2017-11-26 ENCOUNTER — Encounter: Payer: Self-pay | Admitting: Family Medicine

## 2017-11-26 VITALS — BP 140/100 | HR 80 | Temp 98.5°F | Resp 16 | Ht 66.5 in | Wt 220.0 lb

## 2017-11-26 DIAGNOSIS — I10 Essential (primary) hypertension: Secondary | ICD-10-CM

## 2017-11-26 MED ORDER — HYDROCHLOROTHIAZIDE 25 MG PO TABS: 25.0000 mg | ORAL_TABLET | Freq: Every day | ORAL | 3 refills | Status: DC

## 2017-11-26 NOTE — Progress Notes (Signed)
Subjective:    Patient ID: [name], female    DOB: Oct 31, 1965, 52 y.o.   MRN: [id number]  HPI Patient's blood pressure has been elevated in the 140s to 150s over 90-100.  She denies chest pain.  She does have some mild shortness of breath with activity.  She denies any orthopnea or paroxysmal nocturnal dyspnea.  She does report some mild dull headache which she describes as a pressure-like sensation in her face and forehead. Past Medical History:  Diagnosis Date  . Anxiety   . Depression   . Epilepsy ([place])   . Hypertension   . Rosacea   . Seizures [facility])    [facility] (Dr. 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 ABDOMINAL HYSTERECTOMY     non cancerous  . APPENDECTOMY    . appendectomy    . BACK SURGERY    . CHOLECYSTECTOMY    . CHOLECYSTECTOMY    . lumbar     ruptured disc in lumbar taken out  . ROTATOR CUFF REPAIR Left   . TONSILLECTOMY     Current Outpatient Medications on File Prior to Visit  Medication Sig Dispense Refill  . citalopram (CELEXA) 20 MG tablet TAKE 40MG  BY MOUTH ONCE DAILY  2  . Cyanocobalamin (B-12 PO) Take 1 tablet by mouth 2 (two) times daily.    . hydrOXYzine (ATARAX/VISTARIL) 50 MG tablet Take 1 tablet (50 mg total) by mouth every 4 (four) hours as needed for itching. 90 tablet 0  . promethazine (PHENERGAN) 25 MG suppository Place 1 suppository (25 mg total) rectally every 6 (six) hours as needed for nausea or vomiting. 10 each 0  . topiramate (TOPAMAX) 100 MG tablet Take 100-200 mg by mouth 2 (two) times daily. 100mg  in the morning and 200mg  in the evening     No current facility-administered medications on file prior to visit.     Allergies  Allergen Reactions  . Toradol [Ketorolac Tromethamine] Hives  . Zofran [Ondansetron Hcl]     Itching  . Lamotrigine Rash  . Penicillins Hives    Has patient had a PCN reaction  causing immediate rash, facial/tongue/throat swelling, SOB or lightheadedness with hypotension: Yes Has patient had a PCN reaction causing severe rash involving mucus membranes or skin necrosis: No Has patient had a PCN reaction that required hospitalization No Has patient had a PCN reaction occurring within the last 10 years: No If all of the above answers are "NO", then may proceed with Cephalosporin use.     Social History   Socioeconomic History  . Marital status: Married    Spouse name: Not on file  . Number of children: Not on file  . Years of education: Not on file  . Highest education level: Not on file  Occupational History  . Not on file  Social Needs  . Financial resource strain: Not on file  . Food insecurity:    Worry: Not on file    Inability: Not on file  . Transportation needs:    Medical: Not on file    Non-medical: Not on file  Tobacco Use  . Smoking status: Former Research scientist (life sciences)  . Smokeless tobacco: Never Used  Substance and Sexual Activity  . Alcohol use: No    Alcohol/week: 0.0 standard drinks    Comment: occassionally  . Drug use: No  . Sexual activity: Yes  Lifestyle  . Physical activity:  Days per week: Not on file    Minutes per session: Not on file  . Stress: Not on file  Relationships  . Social connections:    Talks on phone: Not on file    Gets together: Not on file    Attends religious service: Not on file    Active member of club or organization: Not on file    Attends meetings of clubs or organizations: Not on file    Relationship status: Not on file  . Intimate partner violence:    Fear of current or ex partner: Not on file    Emotionally abused: Not on file    Physically abused: Not on file    Forced sexual activity: Not on file  Other Topics Concern  . Not on file  Social History Narrative  . Not on file      Review of Systems  All other systems reviewed and are negative.      Objective:   Physical Exam  Constitutional:  She is oriented to person, place, and time. She appears well-developed and well-nourished. No distress.  Cardiovascular: Normal rate, regular rhythm and normal heart sounds.  Pulmonary/Chest: Effort normal and breath sounds normal.  Neurological: She is alert and oriented to person, place, and time. No cranial nerve deficit. She exhibits normal muscle tone. Coordination normal.  Skin: She is not diaphoretic.  Psychiatric: She has a normal mood and affect. Her behavior is normal. Judgment and thought content normal.  Vitals reviewed.   Benign essential HTN - Plan: BASIC METABOLIC PANEL WITH GFR   Begin hydrochlorothiazide 25 mg p.o. daily and recheck blood pressure in 3 to 4 weeks.  Obtain a BMP now for a baseline

## 2017-11-27 LAB — BASIC METABOLIC PANEL WITH GFR
BUN: 10 mg/dL (ref 7–25)
CO2: 25 mmol/L (ref 20–32)
Calcium: 9.5 mg/dL (ref 8.6–10.4)
Chloride: 107 mmol/L (ref 98–110)
Creat: 0.59 mg/dL (ref 0.50–1.05)
GFR, EST NON AFRICAN AMERICAN: 107 mL/min/{1.73_m2} (ref >=60)
GFR, Est African American: 124 mL/min/{1.73_m2} (ref >=60)
Glucose, Bld: 146 mg/dL — ABNORMAL HIGH (ref 65–99)
Potassium: 4.3 mmol/L (ref 3.5–5.3)
Sodium: 140 mmol/L (ref 135–146)

## 2017-11-29 DIAGNOSIS — F4312 Post-traumatic stress disorder, chronic: Secondary | ICD-10-CM | POA: Diagnosis not present

## 2017-12-04 ENCOUNTER — Other Ambulatory Visit: Payer: Self-pay | Admitting: Family Medicine

## 2017-12-04 DIAGNOSIS — R739 Hyperglycemia, unspecified: Secondary | ICD-10-CM

## 2017-12-04 DIAGNOSIS — I10 Essential (primary) hypertension: Secondary | ICD-10-CM

## 2017-12-04 DIAGNOSIS — Z13220 Encounter for screening for lipoid disorders: Secondary | ICD-10-CM

## 2017-12-05 ENCOUNTER — Other Ambulatory Visit: Payer: BLUE CROSS/BLUE SHIELD

## 2017-12-05 DIAGNOSIS — I10 Essential (primary) hypertension: Secondary | ICD-10-CM | POA: Diagnosis not present

## 2017-12-05 DIAGNOSIS — R739 Hyperglycemia, unspecified: Secondary | ICD-10-CM

## 2017-12-05 DIAGNOSIS — Z13220 Encounter for screening for lipoid disorders: Secondary | ICD-10-CM

## 2017-12-06 DIAGNOSIS — F4312 Post-traumatic stress disorder, chronic: Secondary | ICD-10-CM | POA: Diagnosis not present

## 2017-12-06 LAB — LIPID PANEL
Cholesterol: 174 mg/dL (ref <200)
HDL: 54 mg/dL (ref >50)
LDL Cholesterol (Calc): 98 mg/dL (calc)
Non-HDL Cholesterol (Calc): 120 mg/dL (calc) (ref <130)
TRIGLYCERIDES: 120 mg/dL (ref <150)
Total CHOL/HDL Ratio: 3.2 (calc) (ref <5.0)

## 2017-12-06 LAB — HEMOGLOBIN A1C
EAG (MMOL/L): 6 (calc)
HEMOGLOBIN A1C: 5.4 %{Hb} (ref <5.7)
Mean Plasma Glucose: 108 (calc)

## 2017-12-10 DIAGNOSIS — F4310 Post-traumatic stress disorder, unspecified: Secondary | ICD-10-CM | POA: Diagnosis not present

## 2017-12-10 DIAGNOSIS — F329 Major depressive disorder, single episode, unspecified: Secondary | ICD-10-CM | POA: Diagnosis not present

## 2017-12-13 DIAGNOSIS — F4312 Post-traumatic stress disorder, chronic: Secondary | ICD-10-CM | POA: Diagnosis not present

## 2017-12-17 ENCOUNTER — Ambulatory Visit: Payer: BLUE CROSS/BLUE SHIELD | Admitting: Family Medicine

## 2017-12-17 VITALS — BP 126/90 | HR 90 | Temp 98.6°F | Resp 16 | Ht 66.5 in | Wt 223.0 lb

## 2017-12-17 DIAGNOSIS — H9201 Otalgia, right ear: Secondary | ICD-10-CM

## 2017-12-17 MED ORDER — NEOMYCIN-POLYMYXIN-HC 3.5-10000-1 OT SOLN: 3.0000 [drp] | Freq: Four times a day (QID) | OTIC | 0 refills | Status: DC

## 2017-12-17 NOTE — Progress Notes (Signed)
Subjective:    Patient ID: [name], female    DOB: [birth year], 52 y.o.   MRN: [id number]  HPI  11/26/17 Patient's blood pressure has been elevated in the 140s to 150s over 90-100.  She denies chest pain.  She does have some mild shortness of breath with activity.  She denies any orthopnea or paroxysmal nocturnal dyspnea.  She does report some mild dull headache which she describes as a pressure-like sensation in her face and forehead.  At that time, my plan was: Begin hydrochlorothiazide 25 mg p.o. daily and recheck blood pressure in 3 to 4 weeks.  Obtain a BMP now for a baseline  12/17/17 Patient presents with 4 to 5 days of right ear pain.  She also reports yellow drainage coming from the right ear.  It hurts to sleep on her right side.  It is a dull aching pain down in the ear canal.  She also reports some decreased hearing.  On physical exam, the right auditory canal is swollen and narrowed.  There is thick white exudate lining the walls of the canal.  There is pain with manipulation of the helix.  There is no lymphadenopathy.  I am unable to visualize the tympanic membrane due to the thick exudate occluding the canal in a circumferential pattern.  There is a thin opening all the way back to the end of the auditory canal but I am unable to visualize the tympanic membrane well due to the exudate.  There is no wax Past Medical History:  Diagnosis Date  . Anxiety   . Depression   . Epilepsy ([place])   . Hypertension   . Rosacea   . Seizures [facility])    [facility] (Dr. 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 ABDOMINAL HYSTERECTOMY     non cancerous  . APPENDECTOMY    . appendectomy    . BACK SURGERY    . CHOLECYSTECTOMY    . CHOLECYSTECTOMY    . lumbar     ruptured disc in lumbar taken out  . ROTATOR CUFF REPAIR Left   . TONSILLECTOMY     Current Outpatient  Medications on File Prior to Visit  Medication Sig Dispense Refill  . AIMOVIG 70 MG/ML SOAJ INJECT 70 MG SUBCUTANEOUSLY EVERY 28 (TWENTY-EIGHT) DAYS  5  . citalopram (CELEXA) 20 MG tablet TAKE 40MG  BY MOUTH ONCE DAILY  2  . Cyanocobalamin (B-12 PO) Take 1 tablet by mouth 2 (two) times daily.    . diazepam (VALIUM) 2 MG tablet Take 2 mg by mouth as needed.     . hydrochlorothiazide (HYDRODIURIL) 25 MG tablet Take 1 tablet (25 mg total) by mouth daily. 90 tablet 3  . hydrOXYzine (ATARAX/VISTARIL) 50 MG tablet Take 1 tablet (50 mg total) by mouth every 4 (four) hours as needed for itching. 90 tablet 0  . promethazine (PHENERGAN) 25 MG suppository Place 1 suppository (25 mg total) rectally every 6 (six) hours as needed for nausea or vomiting. 10 each 0  . topiramate (TOPAMAX) 100 MG tablet Take 100-200 mg by mouth 2 (two) times daily. 100mg  in the morning and 200mg  in the evening     No current facility-administered medications on file prior to visit.     Allergies  Allergen Reactions  . Toradol [Ketorolac Tromethamine] Hives  . Zofran [Ondansetron Hcl]     Itching  . Lamotrigine Rash  .  Penicillins Hives    Has patient had a PCN reaction causing immediate rash, facial/tongue/throat swelling, SOB or lightheadedness with hypotension: Yes Has patient had a PCN reaction causing severe rash involving mucus membranes or skin necrosis: No Has patient had a PCN reaction that required hospitalization No Has patient had a PCN reaction occurring within the last 10 years: No If all of the above answers are "NO", then may proceed with Cephalosporin use.     Social History   Socioeconomic History  . Marital status: Married    Spouse name: Not on file  . Number of children: Not on file  . Years of education: Not on file  . Highest education level: Not on file  Occupational History  . Not on file  Social Needs  . Financial resource strain: Not on file  . Food insecurity:    Worry: Not on file      Inability: Not on file  . Transportation needs:    Medical: Not on file    Non-medical: Not on file  Tobacco Use  . Smoking status: Former Research scientist (life sciences)  . Smokeless tobacco: Never Used  Substance and Sexual Activity  . Alcohol use: No    Alcohol/week: 0.0 standard drinks    Comment: occassionally  . Drug use: No  . Sexual activity: Yes  Lifestyle  . Physical activity:    Days per week: Not on file    Minutes per session: Not on file  . Stress: Not on file  Relationships  . Social connections:    Talks on phone: Not on file    Gets together: Not on file    Attends religious service: Not on file    Active member of club or organization: Not on file    Attends meetings of clubs or organizations: Not on file    Relationship status: Not on file  . Intimate partner violence:    Fear of current or ex partner: Not on file    Emotionally abused: Not on file    Physically abused: Not on file    Forced sexual activity: Not on file  Other Topics Concern  . Not on file  Social History Narrative  . Not on file      Review of Systems  All other systems reviewed and are negative.      Objective:   Physical Exam  Constitutional: She is oriented to person, place, and time. She appears well-developed and well-nourished. No distress.  HENT:  Right Ear: There is drainage, swelling and tenderness. Decreased hearing is noted.  Left Ear: Tympanic membrane and ear canal normal.  Cardiovascular: Normal rate, regular rhythm and normal heart sounds.  Pulmonary/Chest: Effort normal and breath sounds normal.  Neurological: She is alert and oriented to person, place, and time. No cranial nerve deficit. She exhibits normal muscle tone. Coordination normal.  Skin: She is not diaphoretic.  Psychiatric: She has a normal mood and affect. Her behavior is normal. Judgment and thought content normal.  Vitals reviewed.         A/P Acute otalgia, right  Patient appears to have otitis externa.   Begin Cortisporin HC otic, 3 drops 4 times daily for 5 to 7 days.  Recheck if no better in 1 week or sooner if worse.  Blood pressure today is marginal.  Is better than last time however that her diastolic blood pressure remains slightly elevated.  I have asked the patient to continue to monitor her blood pressure at home and notify  us again in 1 week of the values.  If persistently elevated, we can add additional medication to improve diastolic blood pressure.  I also recommended diet exercise and weight loss

## 2017-12-25 ENCOUNTER — Telehealth: Payer: Self-pay | Admitting: Family Medicine

## 2017-12-25 NOTE — Telephone Encounter (Signed)
Pt called stating that her bp is still running in the 90's per visit with pickard he would add another med if this was the case. Please contact pt if needed.

## 2017-12-27 DIAGNOSIS — F4312 Post-traumatic stress disorder, chronic: Secondary | ICD-10-CM | POA: Diagnosis not present

## 2017-12-27 MED ORDER — AMLODIPINE BESYLATE 5 MG PO TABS: 5.0000 mg | ORAL_TABLET | Freq: Every day | ORAL | 3 refills | Status: DC

## 2017-12-27 NOTE — Telephone Encounter (Signed)
Add amlodipine 5 mg a day and recheck blood pressure in 2 weeks

## 2017-12-27 NOTE — Telephone Encounter (Signed)
Patient aware of providers recommendations and med sent to pharm 

## 2018-01-01 ENCOUNTER — Ambulatory Visit: Payer: BLUE CROSS/BLUE SHIELD | Admitting: Family Medicine

## 2018-01-03 DIAGNOSIS — F4312 Post-traumatic stress disorder, chronic: Secondary | ICD-10-CM | POA: Diagnosis not present

## 2018-02-07 DIAGNOSIS — F4312 Post-traumatic stress disorder, chronic: Secondary | ICD-10-CM | POA: Diagnosis not present

## 2018-02-11 DIAGNOSIS — G43719 Chronic migraine without aura, intractable, without status migrainosus: Secondary | ICD-10-CM | POA: Diagnosis not present

## 2018-02-11 DIAGNOSIS — F4310 Post-traumatic stress disorder, unspecified: Secondary | ICD-10-CM | POA: Diagnosis not present

## 2018-02-14 DIAGNOSIS — F4312 Post-traumatic stress disorder, chronic: Secondary | ICD-10-CM | POA: Diagnosis not present

## 2018-02-21 DIAGNOSIS — F4312 Post-traumatic stress disorder, chronic: Secondary | ICD-10-CM | POA: Diagnosis not present

## 2018-03-01 ENCOUNTER — Ambulatory Visit
Admission: RE | Admit: 2018-03-01 | Discharge: 2018-03-01 | Disposition: A | Discharge status: Home / Self Care | Payer: BLUE CROSS/BLUE SHIELD | Source: Ambulatory Visit | Attending: Family Medicine | Admitting: Family Medicine

## 2018-03-01 ENCOUNTER — Ambulatory Visit (INDEPENDENT_AMBULATORY_CARE_PROVIDER_SITE_OTHER): Payer: BLUE CROSS/BLUE SHIELD | Admitting: Family Medicine

## 2018-03-01 ENCOUNTER — Encounter: Payer: Self-pay | Admitting: Family Medicine

## 2018-03-01 VITALS — BP 156/104 | HR 80 | Temp 98.1°F | Resp 16 | Ht 66.5 in | Wt 228.0 lb

## 2018-03-01 DIAGNOSIS — M79605 Pain in left leg: Secondary | ICD-10-CM

## 2018-03-01 DIAGNOSIS — M545 Low back pain, unspecified: Secondary | ICD-10-CM

## 2018-03-01 DIAGNOSIS — S3992XA Unspecified injury of lower back, initial encounter: Secondary | ICD-10-CM | POA: Diagnosis not present

## 2018-03-01 MED ORDER — AMLODIPINE BESYLATE 5 MG PO TABS: 5.0000 mg | ORAL_TABLET | Freq: Every day | ORAL | 3 refills | Status: DC

## 2018-03-01 MED ORDER — CYCLOBENZAPRINE HCL 10 MG PO TABS: 10.0000 mg | ORAL_TABLET | Freq: Three times a day (TID) | ORAL | 0 refills | Status: DC | PRN

## 2018-03-01 MED ORDER — HYDROCODONE-ACETAMINOPHEN 5-325 MG PO TABS: 1.0000 | ORAL_TABLET | Freq: Four times a day (QID) | ORAL | 0 refills | Status: DC | PRN

## 2018-03-01 NOTE — Progress Notes (Signed)
Subjective:    Patient ID: [name], female    DOB: 01/10/1966, 52 y.o.   MRN: [id number]  HPI Patient is a 52 year old white female who fell Monday night landing on her lower back after she fell.  She has a past medical history of back surgery.  Per her report she has had a ruptured disc between L5 and S1 as well as between L3 and L4 that required surgical correction performed by Dr. Ellene Route.  She states that over the last 4 days she has had severe pain radiating down her lumbar spine from L1 all the way to her tailbone.  She is tender to palpation of the spinous processes of the lumbar vertebrae.  She is tender around her coccyx.  She also reports pain radiating down into her left gluteus and down her left leg.  The pain is described as pins-and-needles and burning.  She denies any weakness in her right leg or in her left leg.  She denies any saddle anesthesia.  She denies any bowel or bladder incontinence.  Her blood pressure is elevated today however she is in severe pain.  However her blood pressures previously been elevated prior to the injury and she never started taking the amlodipine recommended at her last visit. Past Medical History:  Diagnosis Date  . Anxiety   . Depression   . Epilepsy ([place])   . Hypertension   . Rosacea   . Seizures [facility])    [facility] (Dr. 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 ABDOMINAL HYSTERECTOMY     non cancerous  . APPENDECTOMY    . appendectomy    . BACK SURGERY    . CHOLECYSTECTOMY    . CHOLECYSTECTOMY    . lumbar     ruptured disc in lumbar taken out  . ROTATOR CUFF REPAIR Left   . TONSILLECTOMY     Current Outpatient Medications on File Prior to Visit  Medication Sig Dispense Refill  . AIMOVIG 70 MG/ML SOAJ INJECT 70 MG SUBCUTANEOUSLY EVERY 28 (TWENTY-EIGHT) DAYS  5  . citalopram (CELEXA) 20 MG tablet TAKE 40MG  BY MOUTH  ONCE DAILY  2  . Cyanocobalamin (B-12 PO) Take 1 tablet by mouth 2 (two) times daily.    . diazepam (VALIUM) 2 MG tablet Take 2 mg by mouth as needed.     . hydrochlorothiazide (HYDRODIURIL) 25 MG tablet Take 1 tablet (25 mg total) by mouth daily. 90 tablet 3  . hydrOXYzine (ATARAX/VISTARIL) 50 MG tablet Take 1 tablet (50 mg total) by mouth every 4 (four) hours as needed for itching. 90 tablet 0  . mirtazapine (REMERON) 15 MG tablet Take 15 mg by mouth at bedtime.   1  . promethazine (PHENERGAN) 25 MG suppository Place 1 suppository (25 mg total) rectally every 6 (six) hours as needed for nausea or vomiting. 10 each 0  . QUEtiapine (SEROQUEL) 25 MG tablet Take 25 mg by mouth at bedtime.      No current facility-administered medications on file prior to visit.    Allergies  Allergen Reactions  . Toradol [Ketorolac Tromethamine] Hives  . Zofran [Ondansetron Hcl]     Itching  . Lamotrigine Rash  . Penicillins Hives    Has patient had a PCN reaction causing immediate rash, facial/tongue/throat swelling, SOB or lightheadedness with hypotension: Yes Has patient had a PCN reaction causing severe rash involving mucus membranes or skin  necrosis: No Has patient had a PCN reaction that required hospitalization No Has patient had a PCN reaction occurring within the last 10 years: No If all of the above answers are "NO", then may proceed with Cephalosporin use.     Social History   Socioeconomic History  . Marital status: Married    Spouse name: Not on file  . Number of children: Not on file  . Years of education: Not on file  . Highest education level: Not on file  Occupational History  . Not on file  Social Needs  . Financial resource strain: Not on file  . Food insecurity:    Worry: Not on file    Inability: Not on file  . Transportation needs:    Medical: Not on file    Non-medical: Not on file  Tobacco Use  . Smoking status: Former Research scientist (life sciences)  . Smokeless tobacco: Never Used   Substance and Sexual Activity  . Alcohol use: No    Alcohol/week: 0.0 standard drinks    Comment: occassionally  . Drug use: No  . Sexual activity: Yes  Lifestyle  . Physical activity:    Days per week: Not on file    Minutes per session: Not on file  . Stress: Not on file  Relationships  . Social connections:    Talks on phone: Not on file    Gets together: Not on file    Attends religious service: Not on file    Active member of club or organization: Not on file    Attends meetings of clubs or organizations: Not on file    Relationship status: Not on file  . Intimate partner violence:    Fear of current or ex partner: Not on file    Emotionally abused: Not on file    Physically abused: Not on file    Forced sexual activity: Not on file  Other Topics Concern  . Not on file  Social History Narrative  . Not on file      Review of Systems  All other systems reviewed and are negative.      Objective:   Physical Exam Vitals signs reviewed.  Constitutional:      General: She is in acute distress.  HENT:     Head: Normocephalic and atraumatic.  Cardiovascular:     Rate and Rhythm: Normal rate and regular rhythm.     Heart sounds: No murmur.  Pulmonary:     Effort: Pulmonary effort is normal. No respiratory distress.     Breath sounds: Normal breath sounds. No wheezing, rhonchi or rales.  Musculoskeletal:     Lumbar back: She exhibits tenderness, bony tenderness, pain and spasm. She exhibits no swelling, no edema, no deformity and no laceration.       Back:       Legs:  Neurological:     Mental Status: She is alert.           Assessment & Plan:  Low back pain radiating to left leg - Plan: DG Lumbar Spine Complete, HYDROcodone-acetaminophen (NORCO) 5-325 MG tablet, cyclobenzaprine (FLEXERIL) 10 MG tablet, amLODipine (NORVASC) 5 MG tablet  Patient has suffered a fall and is now having severe low back pain with neuropathic symptoms radiating down her left  leg.  Begin by obtaining an x-ray of the lumbar spine to rule out a vertebral fracture after the fall given her tenderness to palpation over the spinous processes of the lumbar vertebrae.  Treat the pain with hydrocodone  5/325 1 p.o. every 6 hours as needed pain and use Flexeril 10 mg every 8 hours as needed for muscle spasms which the patient is having in her lower back.  Add Norvasc 5 mg a day for high blood pressure.  Await the results of the x-ray.  If no fracture, treat the patient symptomatically with pain medication through the weekend.  Reassess next week.  At that point if she is continuing to have neuropathic symptoms, I would recommend a prednisone prepack for possible herniated disc.  Patient requested an MRI however I explained to the patient that I am unable to get that approved by her insurance without first trying conservative therapy and obtaining baseline x-rays.  Reflexes of the right and left leg are normal.  Muscle strength is 4-5 over 5 symmetric and equal.  Patient able to make good effort due to pain however when her age, the patient strength is normal in her legs.  There is no evidence of cauda equina syndrome or surgical emergency.

## 2018-03-07 DIAGNOSIS — F4312 Post-traumatic stress disorder, chronic: Secondary | ICD-10-CM | POA: Diagnosis not present

## 2018-03-08 ENCOUNTER — Ambulatory Visit: Payer: BLUE CROSS/BLUE SHIELD | Admitting: Family Medicine

## 2018-03-08 ENCOUNTER — Encounter: Payer: Self-pay | Admitting: Family Medicine

## 2018-03-08 VITALS — BP 124/90 | HR 105 | Temp 98.3°F | Resp 18 | Ht 65.6 in | Wt 228.0 lb

## 2018-03-08 DIAGNOSIS — M5431 Sciatica, right side: Secondary | ICD-10-CM | POA: Diagnosis not present

## 2018-03-08 DIAGNOSIS — M5432 Sciatica, left side: Secondary | ICD-10-CM

## 2018-03-08 MED ORDER — PREDNISONE 20 MG PO TABS: ORAL_TABLET | ORAL | 0 refills | Status: DC

## 2018-03-08 NOTE — Progress Notes (Signed)
Subjective:    Patient ID: [name], female    DOB: 1966-02-27, 53 y.o.   MRN: [id number]  HPI  03/01/18 Patient is a 53 year old white female who fell Monday night landing on her lower back after she fell.  She has a past medical history of back surgery.  Per her report she has had a ruptured disc between L5 and S1 as well as between L3 and L4 that required surgical correction performed by Dr. Ellene Route.  She states that over the last 51 days she has had severe pain radiating down her lumbar spine from L1 all the way to her tailbone.  She is tender to palpation of the spinous processes of the lumbar vertebrae.  She is tender around her coccyx.  She also reports pain radiating down into her left gluteus and down her left leg.  The pain is described as pins-and-needles and burning.  She denies any weakness in her right leg or in her left leg.  She denies any saddle anesthesia.  She denies any bowel or bladder incontinence.  Her blood pressure is elevated today however she is in severe pain.  However her blood pressures previously been elevated prior to the injury and she never started taking the amlodipine recommended at her last visit.  At that time, my plan was: Patient has suffered a fall and is now having severe low back pain with neuropathic symptoms radiating down her left leg.  Begin by obtaining an x-ray of the lumbar spine to rule out a vertebral fracture after the fall given her tenderness to palpation over the spinous processes of the lumbar vertebrae.  Treat the pain with hydrocodone 5/325 1 p.o. every 6 hours as needed pain and use Flexeril 10 mg every 8 hours as needed for muscle spasms which the patient is having in her lower back.  Add Norvasc 5 mg a day for high blood pressure.  Await the results of the x-ray.  If no fracture, treat the patient symptomatically with pain medication through the weekend.  Reassess next week.  At that point if she is continuing to have neuropathic symptoms, I would  recommend a prednisone prepack for possible herniated disc.  Patient requested an MRI however I explained to the patient that I am unable to get that approved by her insurance without first trying conservative therapy and obtaining baseline x-rays.  Reflexes of the right and left leg are normal.  Muscle strength is 4-5 over 5 symmetric and equal.  Patient able to make good effort due to pain however when her age, the patient strength is normal in her legs.  There is no evidence of cauda equina syndrome or surgical emergency.  03/08/18 X-rays revealed no fracture: IMPRESSION: 1. No acute osseous abnormality. 2. Straightening of the usual lordosis which may reflect positioning and/or spasm. 3. Moderate degenerative disc disease at L2-3, slightly progressive since 2011. 4. Solid appearing L3-4 and L5-S1 fusions with intact hardware. However patient states symptoms are worsening.  She now complains of a pressure-like pain radiating from approximately T12 all the way down to her tailbone.  This pressure is constant.  The pain is moderate to severe in that area.  She is also complaining of burning stinging pain radiating down both legs now.  She denies any leg weakness.  She denies any saddle anesthesia.  She denies any bowel or bladder incontinence.  She still has normal sensation in both legs.  However she is having radicular pain radiating down both legs  concerning for possible cord compression. Past Medical History:  Diagnosis Date  . Anxiety   . Depression   . Epilepsy ([place])   . Hypertension   . Rosacea   . Seizures [facility])    [facility] (Dr. 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 ABDOMINAL HYSTERECTOMY     non cancerous  . APPENDECTOMY    . appendectomy    . BACK SURGERY    . CHOLECYSTECTOMY    . CHOLECYSTECTOMY    . lumbar     ruptured disc in lumbar taken out  .  ROTATOR CUFF REPAIR Left   . TONSILLECTOMY     Current Outpatient Medications on File Prior to Visit  Medication Sig Dispense Refill  . AIMOVIG 70 MG/ML SOAJ INJECT 70 MG SUBCUTANEOUSLY EVERY 28 (TWENTY-EIGHT) DAYS  5  . amLODipine (NORVASC) 5 MG tablet Take 1 tablet (5 mg total) by mouth daily. 90 tablet 3  . citalopram (CELEXA) 20 MG tablet TAKE 40MG  BY MOUTH ONCE DAILY  2  . Cyanocobalamin (B-12 PO) Take 1 tablet by mouth 2 (two) times daily.    . cyclobenzaprine (FLEXERIL) 10 MG tablet Take 1 tablet (10 mg total) by mouth 3 (three) times daily as needed for muscle spasms. 30 tablet 0  . diazepam (VALIUM) 2 MG tablet Take 2 mg by mouth as needed.     . hydrochlorothiazide (HYDRODIURIL) 25 MG tablet Take 1 tablet (25 mg total) by mouth daily. 90 tablet 3  . HYDROcodone-acetaminophen (NORCO) 5-325 MG tablet Take 1 tablet by mouth every 6 (six) hours as needed for moderate pain. 30 tablet 0  . hydrOXYzine (ATARAX/VISTARIL) 50 MG tablet Take 1 tablet (50 mg total) by mouth every 4 (four) hours as needed for itching. 90 tablet 0  . mirtazapine (REMERON) 15 MG tablet Take 15 mg by mouth at bedtime.   1  . promethazine (PHENERGAN) 25 MG suppository Place 1 suppository (25 mg total) rectally every 6 (six) hours as needed for nausea or vomiting. 10 each 0  . QUEtiapine (SEROQUEL) 25 MG tablet Take 25 mg by mouth at bedtime.      No current facility-administered medications on file prior to visit.    Allergies  Allergen Reactions  . Toradol [Ketorolac Tromethamine] Hives  . Zofran [Ondansetron Hcl]     Itching  . Lamotrigine Rash  . Penicillins Hives    Has patient had a PCN reaction causing immediate rash, facial/tongue/throat swelling, SOB or lightheadedness with hypotension: Yes Has patient had a PCN reaction causing severe rash involving mucus membranes or skin necrosis: No Has patient had a PCN reaction that required hospitalization No Has patient had a PCN reaction occurring within the  last 10 years: No If all of the above answers are "NO", then may proceed with Cephalosporin use.     Social History   Socioeconomic History  . Marital status: Married    Spouse name: Not on file  . Number of children: Not on file  . Years of education: Not on file  . Highest education level: Not on file  Occupational History  . Not on file  Social Needs  . Financial resource strain: Not on file  . Food insecurity:    Worry: Not on file    Inability: Not on file  . Transportation needs:    Medical: Not on file    Non-medical: Not on file  Tobacco Use  . Smoking status: Former Research scientist (life sciences)  . Smokeless tobacco: Never Used  Substance and Sexual Activity  . Alcohol use: No    Alcohol/week: 0.0 standard drinks    Comment: occassionally  . Drug use: No  . Sexual activity: Yes  Lifestyle  . Physical activity:    Days per week: Not on file    Minutes per session: Not on file  . Stress: Not on file  Relationships  . Social connections:    Talks on phone: Not on file    Gets together: Not on file    Attends religious service: Not on file    Active member of club or organization: Not on file    Attends meetings of clubs or organizations: Not on file    Relationship status: Not on file  . Intimate partner violence:    Fear of current or ex partner: Not on file    Emotionally abused: Not on file    Physically abused: Not on file    Forced sexual activity: Not on file  Other Topics Concern  . Not on file  Social History Narrative  . Not on file      Review of Systems  All other systems reviewed and are negative.      Objective:   Physical Exam Vitals signs reviewed.  Constitutional:      General: She is in acute distress.  HENT:     Head: Normocephalic and atraumatic.  Cardiovascular:     Rate and Rhythm: Normal rate and regular rhythm.     Heart sounds: No murmur.  Pulmonary:     Effort: Pulmonary effort is normal. No respiratory distress.     Breath sounds:  Normal breath sounds. No wheezing, rhonchi or rales.  Musculoskeletal:     Lumbar back: She exhibits tenderness, bony tenderness, pain and spasm. She exhibits no swelling, no edema, no deformity and no laceration.       Back:       Legs:  Neurological:     Mental Status: She is alert.           Assessment & Plan:  Bilateral sciatica - Plan: predniSONE (DELTASONE) 20 MG tablet  Despite normal x-ray, symptoms are worsening furthermore she now has symptoms concerning for possible cord compression although there is no evidence of cauda equina syndrome or neurosurgical emergency at the present time.  She has normal sensation in both legs.  Muscle strength is 5/5 equal and symmetric in both legs when the patient is encouraged to give maximum effort.  I will give the patient a prednisone taper pack to reduce swelling.  Continue to use pain medication for symptomatic relief.  Schedule an MRI as soon as possible to evaluate further of the lumbar spine

## 2018-03-11 ENCOUNTER — Other Ambulatory Visit: Payer: Self-pay | Admitting: Family Medicine

## 2018-03-11 DIAGNOSIS — M79605 Pain in left leg: Principal | ICD-10-CM

## 2018-03-11 DIAGNOSIS — M545 Low back pain, unspecified: Secondary | ICD-10-CM

## 2018-03-11 NOTE — Telephone Encounter (Signed)
Ok to refill 

## 2018-03-12 ENCOUNTER — Telehealth: Payer: Self-pay | Admitting: Family Medicine

## 2018-03-12 NOTE — Telephone Encounter (Signed)
Pt wants referral for mri changed she said her whole back needs to be scanned. Right now it is only in for a lower back mri.  Please change or advise shannon j what to do.

## 2018-03-12 NOTE — Telephone Encounter (Signed)
Her pain is located in her lumbar spine on exam. Insurance will not cover her neck or upper back if her pain is in her lower back.  Proceed with MRI of lumbar spine.

## 2018-03-13 NOTE — Telephone Encounter (Signed)
Spoke with patient and informed her that since pain is in her lower back insurance will not pay for  MRI of upper back or neck. Patient verbalized understanding.

## 2018-03-14 ENCOUNTER — Other Ambulatory Visit: Payer: Self-pay | Admitting: *Deleted

## 2018-03-14 ENCOUNTER — Other Ambulatory Visit: Payer: Self-pay | Admitting: Family Medicine

## 2018-03-14 ENCOUNTER — Ambulatory Visit
Admission: RE | Admit: 2018-03-14 | Discharge: 2018-03-14 | Disposition: A | Discharge status: Home / Self Care | Payer: BLUE CROSS/BLUE SHIELD | Source: Ambulatory Visit | Attending: Family Medicine | Admitting: Family Medicine

## 2018-03-14 DIAGNOSIS — M5416 Radiculopathy, lumbar region: Secondary | ICD-10-CM | POA: Insufficient documentation

## 2018-03-14 DIAGNOSIS — M48061 Spinal stenosis, lumbar region without neurogenic claudication: Secondary | ICD-10-CM | POA: Diagnosis not present

## 2018-03-14 DIAGNOSIS — M5126 Other intervertebral disc displacement, lumbar region: Secondary | ICD-10-CM | POA: Insufficient documentation

## 2018-03-14 DIAGNOSIS — M545 Low back pain, unspecified: Secondary | ICD-10-CM

## 2018-03-14 DIAGNOSIS — M47816 Spondylosis without myelopathy or radiculopathy, lumbar region: Secondary | ICD-10-CM | POA: Insufficient documentation

## 2018-03-14 DIAGNOSIS — M5431 Sciatica, right side: Secondary | ICD-10-CM

## 2018-03-14 DIAGNOSIS — M5432 Sciatica, left side: Principal | ICD-10-CM

## 2018-03-14 DIAGNOSIS — M79605 Pain in left leg: Principal | ICD-10-CM

## 2018-03-14 MED ORDER — HYDROCODONE-ACETAMINOPHEN 5-325 MG PO TABS: 1.0000 | ORAL_TABLET | Freq: Four times a day (QID) | ORAL | 0 refills | Status: DC | PRN

## 2018-03-14 NOTE — Telephone Encounter (Signed)
Patient is requesting a refill on Hydrocodone   LOV: 03/08/18  LRF:   03/01/18

## 2018-03-14 NOTE — Telephone Encounter (Signed)
Pt needs refill on hydrocodone to [facility]

## 2018-03-20 DIAGNOSIS — M5126 Other intervertebral disc displacement, lumbar region: Secondary | ICD-10-CM | POA: Diagnosis not present

## 2018-03-20 DIAGNOSIS — M5416 Radiculopathy, lumbar region: Secondary | ICD-10-CM | POA: Diagnosis not present

## 2018-03-20 DIAGNOSIS — M545 Low back pain: Secondary | ICD-10-CM | POA: Diagnosis not present

## 2018-03-21 DIAGNOSIS — F4312 Post-traumatic stress disorder, chronic: Secondary | ICD-10-CM | POA: Diagnosis not present

## 2018-03-22 DIAGNOSIS — M5416 Radiculopathy, lumbar region: Secondary | ICD-10-CM | POA: Diagnosis not present

## 2018-03-22 DIAGNOSIS — M4726 Other spondylosis with radiculopathy, lumbar region: Secondary | ICD-10-CM | POA: Diagnosis not present

## 2018-03-27 DIAGNOSIS — M5416 Radiculopathy, lumbar region: Secondary | ICD-10-CM | POA: Diagnosis not present

## 2018-03-27 DIAGNOSIS — M5126 Other intervertebral disc displacement, lumbar region: Secondary | ICD-10-CM | POA: Diagnosis not present

## 2018-03-27 DIAGNOSIS — M545 Low back pain: Secondary | ICD-10-CM | POA: Diagnosis not present

## 2018-03-28 DIAGNOSIS — F4312 Post-traumatic stress disorder, chronic: Secondary | ICD-10-CM | POA: Diagnosis not present

## 2018-03-29 DIAGNOSIS — M5126 Other intervertebral disc displacement, lumbar region: Secondary | ICD-10-CM | POA: Diagnosis not present

## 2018-03-29 DIAGNOSIS — M5416 Radiculopathy, lumbar region: Secondary | ICD-10-CM | POA: Diagnosis not present

## 2018-03-29 DIAGNOSIS — M545 Low back pain: Secondary | ICD-10-CM | POA: Diagnosis not present

## 2018-04-01 DIAGNOSIS — R419 Unspecified symptoms and signs involving cognitive functions and awareness: Secondary | ICD-10-CM | POA: Diagnosis not present

## 2018-04-01 DIAGNOSIS — G43019 Migraine without aura, intractable, without status migrainosus: Secondary | ICD-10-CM | POA: Diagnosis not present

## 2018-04-01 DIAGNOSIS — R9401 Abnormal electroencephalogram [EEG]: Secondary | ICD-10-CM | POA: Diagnosis not present

## 2018-04-01 DIAGNOSIS — F445 Conversion disorder with seizures or convulsions: Secondary | ICD-10-CM | POA: Diagnosis not present

## 2018-04-02 DIAGNOSIS — M545 Low back pain: Secondary | ICD-10-CM | POA: Diagnosis not present

## 2018-04-02 DIAGNOSIS — M5416 Radiculopathy, lumbar region: Secondary | ICD-10-CM | POA: Diagnosis not present

## 2018-04-02 DIAGNOSIS — M5126 Other intervertebral disc displacement, lumbar region: Secondary | ICD-10-CM | POA: Diagnosis not present

## 2018-04-04 DIAGNOSIS — F4312 Post-traumatic stress disorder, chronic: Secondary | ICD-10-CM | POA: Diagnosis not present

## 2018-04-05 DIAGNOSIS — M5126 Other intervertebral disc displacement, lumbar region: Secondary | ICD-10-CM | POA: Diagnosis not present

## 2018-04-05 DIAGNOSIS — M5416 Radiculopathy, lumbar region: Secondary | ICD-10-CM | POA: Diagnosis not present

## 2018-04-05 DIAGNOSIS — M545 Low back pain: Secondary | ICD-10-CM | POA: Diagnosis not present

## 2018-04-11 DIAGNOSIS — F4312 Post-traumatic stress disorder, chronic: Secondary | ICD-10-CM | POA: Diagnosis not present

## 2018-04-12 DIAGNOSIS — G43019 Migraine without aura, intractable, without status migrainosus: Secondary | ICD-10-CM | POA: Diagnosis not present

## 2018-04-12 DIAGNOSIS — R400 Somnolence: Secondary | ICD-10-CM | POA: Diagnosis not present

## 2018-04-18 DIAGNOSIS — M5416 Radiculopathy, lumbar region: Secondary | ICD-10-CM | POA: Diagnosis not present

## 2018-04-18 DIAGNOSIS — F4312 Post-traumatic stress disorder, chronic: Secondary | ICD-10-CM | POA: Diagnosis not present

## 2018-04-18 DIAGNOSIS — M47816 Spondylosis without myelopathy or radiculopathy, lumbar region: Secondary | ICD-10-CM | POA: Diagnosis not present

## 2018-04-22 DIAGNOSIS — F4310 Post-traumatic stress disorder, unspecified: Secondary | ICD-10-CM | POA: Diagnosis not present

## 2018-04-23 ENCOUNTER — Other Ambulatory Visit (HOSPITAL_BASED_OUTPATIENT_CLINIC_OR_DEPARTMENT_OTHER): Payer: Self-pay

## 2018-04-23 ENCOUNTER — Other Ambulatory Visit: Payer: Self-pay | Admitting: Neurological Surgery

## 2018-04-23 DIAGNOSIS — R51 Headache: Secondary | ICD-10-CM

## 2018-04-23 DIAGNOSIS — R0683 Snoring: Secondary | ICD-10-CM

## 2018-04-23 DIAGNOSIS — G8929 Other chronic pain: Secondary | ICD-10-CM

## 2018-04-23 DIAGNOSIS — G4710 Hypersomnia, unspecified: Secondary | ICD-10-CM

## 2018-05-01 ENCOUNTER — Ambulatory Visit: Payer: BLUE CROSS/BLUE SHIELD | Attending: Neurology | Admitting: Neurology

## 2018-05-01 DIAGNOSIS — R0689 Other abnormalities of breathing: Secondary | ICD-10-CM | POA: Diagnosis not present

## 2018-05-01 DIAGNOSIS — G4733 Obstructive sleep apnea (adult) (pediatric): Secondary | ICD-10-CM | POA: Diagnosis not present

## 2018-05-01 DIAGNOSIS — R519 Headache, unspecified: Secondary | ICD-10-CM

## 2018-05-01 DIAGNOSIS — R0683 Snoring: Secondary | ICD-10-CM | POA: Diagnosis not present

## 2018-05-01 DIAGNOSIS — R51 Headache: Secondary | ICD-10-CM | POA: Diagnosis not present

## 2018-05-01 DIAGNOSIS — G4710 Hypersomnia, unspecified: Secondary | ICD-10-CM | POA: Diagnosis not present

## 2018-05-02 DIAGNOSIS — R419 Unspecified symptoms and signs involving cognitive functions and awareness: Secondary | ICD-10-CM | POA: Diagnosis not present

## 2018-05-06 NOTE — Procedures (Signed)
Posey A. Merlene Laughter, MD     [email or web address]             NOCTURNAL POLYSOMNOGRAPHY   LOCATION: [facility]  Patient Name: [name], [name] Date: 05/01/2018 Gender: Female D.O.B: October 31, 1965 Age (years): 89 Referring Provider: Johny Chess MD Height (inches): 67 Interpreting Physician: Phillips Odor MD, ABSM Weight (lbs): 226 RPSGT: Peak, Robert BMI: 35 MRN: [id number] Neck Size: 16.00 CLINICAL INFORMATION Sleep Study Type: NPSG     Indication for sleep study: Snoring     Epworth Sleepiness Score:     SLEEP STUDY TECHNIQUE As per the AASM Manual for the Scoring of Sleep and Associated Events v2.3 (April 2016) with a hypopnea requiring 4% desaturations.  The channels recorded and monitored were frontal, central and occipital EEG, electrooculogram (EOG), submentalis EMG (chin), nasal and oral airflow, thoracic and abdominal wall motion, anterior tibialis EMG, snore microphone, electrocardiogram, and pulse oximetry.  MEDICATIONS Medications self-administered by patient taken the night of the study : N/A  Current Outpatient Medications:  .  AIMOVIG 70 MG/ML SOAJ, INJECT 70 MG SUBCUTANEOUSLY EVERY 28 (TWENTY-EIGHT) DAYS, Disp: , Rfl: 5 .  amLODipine (NORVASC) 5 MG tablet, Take 1 tablet (5 mg total) by mouth daily., Disp: 90 tablet, Rfl: 3 .  citalopram (CELEXA) 20 MG tablet, TAKE 40MG  BY MOUTH ONCE DAILY, Disp: , Rfl: 2 .  Cyanocobalamin (B-12 PO), Take 1 tablet by mouth 2 (two) times daily., Disp: , Rfl:  .  cyclobenzaprine (FLEXERIL) 10 MG tablet, TAKE 1 TABLET BY MOUTH THREE TIMES A DAY AS NEEDED FOR MUSCLE SPASMS, Disp: 30 tablet, Rfl: 0 .  diazepam (VALIUM) 2 MG tablet, Take 2 mg by mouth as needed. , Disp: , Rfl:  .  hydrochlorothiazide (HYDRODIURIL) 25 MG tablet, Take 1 tablet (25 mg total) by mouth daily., Disp: 90 tablet, Rfl: 3 .  HYDROcodone-acetaminophen (NORCO) 5-325 MG tablet, Take 1 tablet by mouth every 6 (six) hours as needed for  moderate pain., Disp: 30 tablet, Rfl: 0 .  hydrOXYzine (ATARAX/VISTARIL) 50 MG tablet, Take 1 tablet (50 mg total) by mouth every 4 (four) hours as needed for itching., Disp: 90 tablet, Rfl: 0 .  mirtazapine (REMERON) 15 MG tablet, Take 15 mg by mouth at bedtime. , Disp: , Rfl: 1 .  predniSONE (DELTASONE) 20 MG tablet, 3 tabs poqday 1-2, 2 tabs poqday 3-4, 1 tab poqday 5-6, Disp: 12 tablet, Rfl: 0 .  promethazine (PHENERGAN) 25 MG suppository, Place 1 suppository (25 mg total) rectally every 6 (six) hours as needed for nausea or vomiting., Disp: 10 each, Rfl: 0 .  QUEtiapine (SEROQUEL) 25 MG tablet, Take 25 mg by mouth at bedtime. , Disp: , Rfl:      SLEEP ARCHITECTURE The study was initiated at 10:35:38 PM and ended at 5:15:32 AM.  Sleep onset time was 30.7 minutes and the sleep efficiency was 88.4%%. The total sleep time was 353.7 minutes.  Stage REM latency was 183.0 minutes.  The patient spent 4.4%% of the night in stage N1 sleep, 50.7%% in stage N2 sleep, 34.3%% in stage N3 and 10.6% in REM.  Alpha intrusion was absent.  Supine sleep was 50.97%.  RESPIRATORY PARAMETERS The overall apnea/hypopnea index (AHI) was 4.2 per hour. There were 1 total apneas, including 0 obstructive, 1 central and 0 mixed apneas. There were 24 hypopneas and 39 RERAs.  The AHI during Stage REM sleep was 6.4 per hour. There is also my related rhythm desaturation without apneic events.  The total time  with oxygen saturation less than 88% is 3.1 minutes.  AHI while supine was 5.0 per hour.  The mean oxygen saturation was 93.7%. The minimum SpO2 during sleep was 86.0%.  loud snoring was noted during this study.  CARDIAC DATA The 2 lead EKG demonstrated sinus rhythm. The mean heart rate was 72.1 beats per minute. Other EKG findings include: None. LEG MOVEMENT DATA The total PLMS were 0 with a resulting PLMS index of 0.0. Associated arousal with leg movement index was 0.0.  IMPRESSIONS 1.   Mild REM  related hypoventilation not requiring treatment at this time.   Delano Metz, MD Diplomate, American Board of Sleep Medicine.  ELECTRONICALLY SIGNED ON:  05/06/2018, 9:19 AM [facility] PH: [phone]   FX: [phone] [place]

## 2018-05-09 DIAGNOSIS — F4312 Post-traumatic stress disorder, chronic: Secondary | ICD-10-CM | POA: Diagnosis not present

## 2018-05-14 NOTE — Pre-Procedure Instructions (Signed)
[name]  05/14/2018      [id number] [place], [place] - [address] [address] Phone: [phone] Fax: [phone]    Your procedure is scheduled on May 23, 2018.  Report to [facility] Entrance "A" at 950 AM.  Call this number if you have problems the morning of surgery:  [phone]   Remember:  Do not eat or drink after midnight.     Take these medicines the morning of surgery with A SIP OF WATER  amlodipine (Norvasc) Duloxetine (Cymbalta)  IF NEEDED: cylcobenzaprine (flexeril) Diazepam (Valium) Hydroxyzine (atarax)  7 days prior to surgery STOP taking any Aspirin (unless otherwise instructed by your surgeon), Aleve, Naproxen, Ibuprofen, Motrin, Advil, Goody's, BC's, all herbal medications, fish oil, and all vitamins    Do not wear jewelry, make-up or nail polish.  Do not wear lotions, powders, or perfumes, or deodorant.  Do not shave 48 hours prior to surgery.   Do not bring valuables to the hospital.  [facility] is not responsible for any belongings or valuables.  Contacts, dentures or bridgework may not be worn into surgery.  Leave your suitcase in the car.  After surgery it may be brought to your room.  For patients admitted to the hospital, discharge time will be determined by your treatment team.  Patients discharged the day of surgery will not be allowed to drive home.    [place]- Preparing For Surgery  Before surgery, you can play an important role. Because skin is not sterile, your skin needs to be as free of germs as possible. You can reduce the number of germs on your skin by washing with CHG (chlorahexidine gluconate) Soap before surgery.  CHG is an antiseptic cleaner which kills germs and bonds with the skin to continue killing germs even after washing.    Oral Hygiene is also important to reduce your risk of infection.  Remember - BRUSH YOUR TEETH THE MORNING OF SURGERY WITH YOUR  REGULAR TOOTHPASTE  Please do not use if you have an allergy to CHG or antibacterial soaps. If your skin becomes reddened/irritated stop using the CHG.  Do not shave (including legs and underarms) for at least 48 hours prior to first CHG shower. It is OK to shave your face.  Please follow these instructions carefully.   1. Shower the NIGHT BEFORE SURGERY and the MORNING OF SURGERY with CHG.   2. If you chose to wash your hair, wash your hair first as usual with your normal shampoo.  3. After you shampoo, rinse your hair and body thoroughly to remove the shampoo.  4. Use CHG as you would any other liquid soap. You can apply CHG directly to the skin and wash gently with a scrungie or a clean washcloth.   5. Apply the CHG Soap to your body ONLY FROM THE NECK DOWN.  Do not use on open wounds or open sores. Avoid contact with your eyes, ears, mouth and genitals (private parts). Wash Face and genitals (private parts)  with your normal soap.  6. Wash thoroughly, paying special attention to the area where your surgery will be performed.  7. Thoroughly rinse your body with warm water from the neck down.  8. DO NOT shower/wash with your normal soap after using and rinsing off the CHG Soap.  9. Pat yourself dry with a CLEAN TOWEL.  10. Wear CLEAN PAJAMAS to bed the night before surgery, wear comfortable  clothes the morning of surgery  11. Place CLEAN SHEETS on your bed the night of your first shower and DO NOT SLEEP WITH PETS.  Day of Surgery:  Do not apply any deodorants/lotions.  Please wear clean clothes to the hospital/surgery center.   Remember to brush your teeth WITH YOUR REGULAR TOOTHPASTE.   Please read over the following fact sheets that you were given.

## 2018-05-15 ENCOUNTER — Encounter (HOSPITAL_COMMUNITY): Payer: Self-pay

## 2018-05-15 ENCOUNTER — Other Ambulatory Visit: Payer: Self-pay

## 2018-05-15 ENCOUNTER — Encounter (HOSPITAL_COMMUNITY)
Admission: RE | Admit: 2018-05-15 | Discharge: 2018-05-15 | Disposition: A | Discharge status: Home / Self Care | Payer: BLUE CROSS/BLUE SHIELD | Source: Ambulatory Visit | Attending: Neurological Surgery | Admitting: Neurological Surgery

## 2018-05-15 DIAGNOSIS — Z01812 Encounter for preprocedural laboratory examination: Secondary | ICD-10-CM | POA: Insufficient documentation

## 2018-05-15 HISTORY — DX: Headache, unspecified: R51.9

## 2018-05-15 HISTORY — DX: Headache: R51

## 2018-05-15 HISTORY — DX: Conversion disorder with seizures or convulsions: F44.5

## 2018-05-15 HISTORY — DX: Post-traumatic stress disorder, unspecified: F43.10

## 2018-05-15 HISTORY — DX: Sleep apnea, unspecified: G47.30

## 2018-05-15 LAB — BASIC METABOLIC PANEL
ANION GAP: 11 (ref 5–15)
BUN: 12 mg/dL (ref 6–20)
CO2: 29 mmol/L (ref 22–32)
Calcium: 9.4 mg/dL (ref 8.9–10.3)
Chloride: 98 mmol/L (ref 98–111)
Creatinine, Ser: 0.9 mg/dL (ref 0.44–1.00)
GFR calc Af Amer: >60 mL/min (ref >60)
GFR calc non Af Amer: >60 mL/min (ref >60)
Glucose, Bld: 156 mg/dL — ABNORMAL HIGH (ref 70–99)
Potassium: 3 mmol/L — ABNORMAL LOW (ref 3.5–5.1)
Sodium: 138 mmol/L (ref 135–145)

## 2018-05-15 LAB — TYPE AND SCREEN
ABO/RH(D): O POS
Antibody Screen: NEGATIVE

## 2018-05-15 LAB — CBC
HCT: 42.4 % (ref 36.0–46.0)
HEMOGLOBIN: 13.9 g/dL (ref 12.0–15.0)
MCH: 29.5 pg (ref 26.0–34.0)
MCHC: 32.8 g/dL (ref 30.0–36.0)
MCV: 90 fL (ref 80.0–100.0)
Platelets: 342 10*3/uL (ref 150–400)
RBC: 4.71 MIL/uL (ref 3.87–5.11)
RDW: 13.2 % (ref 11.5–15.5)
WBC: 10.1 10*3/uL (ref 4.0–10.5)
nRBC: 0 % (ref 0.0–0.2)

## 2018-05-15 LAB — SURGICAL PCR SCREEN
MRSA, PCR: NEGATIVE
Staphylococcus aureus: POSITIVE — AB

## 2018-05-15 LAB — ABO/RH: ABO/RH(D): O POS

## 2018-05-15 NOTE — Progress Notes (Signed)
Anesthesia PAT Evaluation:   Case:  [id number] Date/Time:  05/23/18 1138   Procedure:  Lumbar 4-5 Posterior lumbar interbody fusion (N/A Back) - Lumbar 4-5 Posterior lumbar interbody fusion   Anesthesia type:  General   Pre-op diagnosis:  Lumbar spondylosis   Location:  [facility] OR ROOM 67 / [facility]   Surgeon:  Kristeen Miss, MD      DISCUSSION: Patient is a 53 year old female scheduled for the above procedure.  History includes former smoker (quit 1993), HTN, Conversion disorder, pseudoseizures (psychogenic nonepileptic seizures), OSA (no CPAP), PTSD. BMI is consistent with morbid obesity. - ED visit 08/28/17 for upper back pain, intermittent shoulder/shoulder pain, and headache x 2-3 days that started following a "seizure" (pseudoseizure). Chest pain felt atypical. EKG, troponin x2, DK, d-dimer WNL.  According to neurologist Dr. Opal Sidles on 04/01/18,  "This is a patient with confirmed conversion disorder with seizure-like episodes. We again discussed the nature of the episodes and the stressors that might be triggering them. I explained that these are not epileptic seizures and they do not have an abnormal discharge of the brain. There would not benefit from seizure medication. However, they are also not purposeful and are likely conversion disorder. She is receiving appropriate treatment with worked with a psychiatrist and therapist. No further neurological intervention is needed."  Patient reported initially pseudoseizures presented as bodily shaking, particularly her legs. Last 01/2018. Predominantly now she will have episodes where she will stare off for a couple of minutes. Episodes are triggered by stress and can occur up to several times a month. Afterwards she will feel completely drained and have a severe headache. Events may also be associated with palpitations, lightheadedness (without syncope), and occasional chest heaviness. She has had chest pains and palpitations associated with anxiety since age  46. She denied any exertional chest pains. She can get occasional palpitations regardless of pseudoseizure events, but only lasting for a few minutes. She had a stress test in 2016 for evaluation of chest pain and an event monitor for evaluation of palpitations in 2017. She has chronic mild ankle edema. She has had two previous back surgeries, and has had increasing back issues over the past six months that have limited her activity level, but was able to sweep, vacuum without CV symptoms. She cannot currently tolerate prolonged standing due to pain.     Patient with long history of atypical chest pains with previous non-ischemic CPX in 2016. She does not described any exertional symptoms, rather symptoms that correlate with high levels of stress or anxiety that she has been experiencing since adulthood. Her most recent EKG was WNL. No current cardiopulmonary symptoms. If no acute changes then I would anticipate that she can proceed as planned. Discussed with anesthesiologist Renold Don, MD.    VS: BP 119/74   Pulse 94   Temp 36.6 C   Ht 5' 6.5" (1.689 m)   Wt 125.2 kg   SpO2 96%   BMI 43.90 kg/m  Patient is a pleasant Caucasian female in NAD. No conversational dyspnea. Heart RRR, no murmur noted. Lungs clear. Trace non-pitting ankle edema. No carotid bruits noted.   PROVIDERS: [name], MD is PCP  - Johny Chess, MD is headache neurologist ([place]). Last visit 04/25/18. Aline August, MD is psychogenic nonepileptic seizure neurologist ([place]). Last visit 04/01/18. Referred to neurocognitive testing and advised to follow-up with neurosurgeon for back issues. Dorris Carnes, MD is cardiologist. Seen in [phone] for chest  pain and palpitations. See stress and monitor results below. Chest pain was not thought typical for angina. CPX test suggested DOE related to weight.   LABS: Labs reviewed: Acceptable for surgery. A1c 12/05/17 5.4%. (all labs  ordered are listed, but only abnormal results are displayed)  Labs Reviewed  SURGICAL PCR SCREEN - Abnormal; Notable for the following components:      Result Value   Staphylococcus aureus POSITIVE (*)    All other components within normal limits  BASIC METABOLIC PANEL - Abnormal; Notable for the following components:   Potassium 3.0 (*)    Glucose, Bld 156 (*)    All other components within normal limits  CBC  TYPE AND SCREEN    OTHER: Polysomnography 05/01/18: IMPRESSIONS: 1.   Mild REM related hypoventilation not requiring treatment at this time.  Long-term EEG monitoring 05/10/17-05/12/17 ([place]): Summary/Assessment: - During the course of this hospitalization, the interictal EEG  showed: A mildly diffusely slow background. There were 6 events,  consisting of episodes of unresponsiveness as well as diffuse  body shaking with no EEG correlate.  Conclusion/Plan:  Based on the above clinical and EEG data: The patient's typical  episodes were recorded and are not consistent with epileptic  seizures. They most likely represent non-epileptic episodes that  may be triggered by stress.. These findings were discussed in  detail with the patient. Prior to discharge, medications were  adjusted as follows: restart topamax 100 mg AM/200 mg PM. Will  not restart PHT or Tegretol. The plan after discharge is: follow  up with Dr. Opal Sidles, no driving, referral to pyschology.    IMAGES: MRI L-spine 03/14/18: IMPRESSION: - Mild spinal stenosis and mild subarticular stenosis bilaterally L2-3 - Solid fusion L3-4 without stenosis - Moderate spinal stenosis L4-5 with moderate subarticular and foraminal stenosis bilaterally due to marked facet degeneration - Solid fusion L5-S1 with left laminotomy. No neural impingement identified.  CXR 08/27/17: IMPRESSION: No active cardiopulmonary disease. No musculoskeletal injury is Evident.   EKG: 08/27/17: NSR   CV: Cardiac Event  Monitor 05/03/15-06/01/15: Study Highlights: Sinus rhythm. No arrhythmias noted.   CPX 06/15/14: Conclusion: Exercise testing with gas-exchange demonstrates a low normal functional capacity when compared to matched sedentary norms. There is no evidence of ischemia. At peak exercise the patient is limited by her ventilation which is likely related to her obesity and restrictive physiology. The MVV is reduced out of proportion to her spirometry and may reflect poor effort or respiratory muscle fatigue. Can repeat PFTs with MIP and MEP measurements if clinically indicated however major limitation appears to be her body habitus.    Past Medical History:  Diagnosis Date  . Anxiety   . Depression   . Headache   . Hypertension   . Pseudoseizure   . PTSD (post-traumatic stress disorder)   . Rosacea   . Seizures [facility])    [facility] (Dr. 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 Sleep apnea     Past Surgical History:  Procedure Laterality Date  . ABDOMINAL HYSTERECTOMY     non cancerous, partial  . APPENDECTOMY    . appendectomy    . BACK SURGERY     x2  . CHOLECYSTECTOMY    . CHOLECYSTECTOMY    . CYST EXCISION     on ovaries  . lumbar     ruptured disc in lumbar taken out  . ROTATOR CUFF REPAIR Left   . TONSILLECTOMY    .  TUBAL LIGATION      MEDICATIONS: . AIMOVIG 140 MG/ML SOAJ  . amLODipine (NORVASC) 5 MG tablet  . Cyanocobalamin (B-12 PO)  . cyclobenzaprine (FLEXERIL) 10 MG tablet  . diazepam (VALIUM) 2 MG tablet  . DULoxetine (CYMBALTA) 60 MG capsule  . hydrochlorothiazide (HYDRODIURIL) 25 MG tablet  . HYDROcodone-acetaminophen (NORCO) 5-325 MG tablet  . hydrOXYzine (ATARAX/VISTARIL) 50 MG tablet  . mirtazapine (REMERON) 15 MG tablet  . predniSONE (DELTASONE) 20 MG tablet  . promethazine (PHENERGAN) 25 MG suppository  . QUEtiapine (SEROQUEL) 25 MG tablet   No current facility-administered  medications for this encounter.   Patient not taking prednisone, Norco, promethazine   Myra Gianotti, PA-C Surgical Short Stay/Anesthesiology [facility] Phone [phone] [facility] Phone [phone] 05/15/2018 1:34 PM

## 2018-05-15 NOTE — Progress Notes (Addendum)
PCP - Jenna Luo Cardiologist - Nevin Bloodgood Ross-saw 2016 Neurologist: Dr. Sofie Hartigan (migranes), Dr. Opal Sidles @ [facility]  Chest x-ray - 6/19 EKG - 6/19 Stress Test - 2016 ECHO -  na Cardiac Cath - na  Sleep Study - recently-waiting on results, pt. 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 CPAP - has not received yet  Fasting Blood Sugar - na Checks Blood Sugar _____ times a day  Blood Thinner Instructions: Aspirin Instructions:  Anesthesia review: pseudoseizures -several times a week,with chest pain  Patient denies shortness of breath, fever, cough and chest pain at PAT appointment   Patient verbalized understanding of instructions that were given to them at the PAT appointment. Patient was also instructed that they will need to review over the PAT instructions again at home before surgery.   [facility] [place]

## 2018-05-15 NOTE — Anesthesia Preprocedure Evaluation (Deleted)
Anesthesia Evaluation    Airway        Dental   Pulmonary former smoker,           Cardiovascular hypertension,      Neuro/Psych    GI/Hepatic   Endo/Other    Renal/GU      Musculoskeletal   Abdominal   Peds  Hematology   Anesthesia Other Findings   Reproductive/Obstetrics                             Anesthesia Physical Anesthesia Plan  ASA:   Anesthesia Plan:    Post-op Pain Management:    Induction:   PONV Risk Score and Plan:   Airway Management Planned:   Additional Equipment:   Intra-op Plan:   Post-operative Plan:   Informed Consent:   Plan Discussed with:   Anesthesia Plan Comments: (PAT note written 05/15/2018 by Myra Gianotti, PA-C. )        Anesthesia Quick Evaluation

## 2018-05-22 MED ORDER — VANCOMYCIN HCL 10 G IV SOLR
1500.0000 mg | INTRAVENOUS | Status: AC
  Administered 2018-05-23: 1500 mg via INTRAVENOUS
  Filled 2018-05-22: qty 1500

## 2018-05-23 ENCOUNTER — Inpatient Hospital Stay (HOSPITAL_COMMUNITY)
Admission: RE | Admit: 2018-05-23 | Discharge: 2018-05-26 | DRG: 454 | Disposition: A | Discharge status: Home Health | Payer: BLUE CROSS/BLUE SHIELD | Attending: Neurological Surgery | Admitting: Neurological Surgery

## 2018-05-23 ENCOUNTER — Encounter (HOSPITAL_COMMUNITY): Payer: Self-pay | Admitting: Orthopedic Surgery

## 2018-05-23 ENCOUNTER — Encounter (HOSPITAL_COMMUNITY)
Admission: RE | Disposition: A | Discharge status: Home Health | Payer: Self-pay | Source: Home / Self Care | Attending: Neurological Surgery

## 2018-05-23 ENCOUNTER — Other Ambulatory Visit: Payer: Self-pay

## 2018-05-23 ENCOUNTER — Inpatient Hospital Stay (HOSPITAL_COMMUNITY): Payer: BLUE CROSS/BLUE SHIELD

## 2018-05-23 ENCOUNTER — Inpatient Hospital Stay (HOSPITAL_COMMUNITY): Payer: BLUE CROSS/BLUE SHIELD | Admitting: Vascular Surgery

## 2018-05-23 DIAGNOSIS — G40209 Localization-related (focal) (partial) symptomatic epilepsy and epileptic syndromes with complex partial seizures, not intractable, without status epilepticus: Secondary | ICD-10-CM | POA: Diagnosis present

## 2018-05-23 DIAGNOSIS — M4306 Spondylolysis, lumbar region: Secondary | ICD-10-CM

## 2018-05-23 DIAGNOSIS — Z880 Allergy status to penicillin: Secondary | ICD-10-CM

## 2018-05-23 DIAGNOSIS — M5116 Intervertebral disc disorders with radiculopathy, lumbar region: Secondary | ICD-10-CM | POA: Diagnosis present

## 2018-05-23 DIAGNOSIS — Z833 Family history of diabetes mellitus: Secondary | ICD-10-CM | POA: Diagnosis not present

## 2018-05-23 DIAGNOSIS — M4726 Other spondylosis with radiculopathy, lumbar region: Secondary | ICD-10-CM | POA: Diagnosis not present

## 2018-05-23 DIAGNOSIS — Z981 Arthrodesis status: Secondary | ICD-10-CM

## 2018-05-23 DIAGNOSIS — Z8249 Family history of ischemic heart disease and other diseases of the circulatory system: Secondary | ICD-10-CM

## 2018-05-23 DIAGNOSIS — Z800 Family history of malignant neoplasm of digestive organs: Secondary | ICD-10-CM

## 2018-05-23 DIAGNOSIS — I10 Essential (primary) hypertension: Secondary | ICD-10-CM | POA: Diagnosis not present

## 2018-05-23 DIAGNOSIS — Z803 Family history of malignant neoplasm of breast: Secondary | ICD-10-CM

## 2018-05-23 DIAGNOSIS — F329 Major depressive disorder, single episode, unspecified: Secondary | ICD-10-CM | POA: Diagnosis present

## 2018-05-23 DIAGNOSIS — D62 Acute posthemorrhagic anemia: Secondary | ICD-10-CM | POA: Diagnosis not present

## 2018-05-23 DIAGNOSIS — R51 Headache: Secondary | ICD-10-CM | POA: Diagnosis not present

## 2018-05-23 DIAGNOSIS — F4310 Post-traumatic stress disorder, unspecified: Secondary | ICD-10-CM | POA: Diagnosis not present

## 2018-05-23 DIAGNOSIS — Z79899 Other long term (current) drug therapy: Secondary | ICD-10-CM | POA: Diagnosis not present

## 2018-05-23 DIAGNOSIS — Z888 Allergy status to other drugs, medicaments and biological substances status: Secondary | ICD-10-CM

## 2018-05-23 DIAGNOSIS — M48062 Spinal stenosis, lumbar region with neurogenic claudication: Principal | ICD-10-CM | POA: Diagnosis present

## 2018-05-23 DIAGNOSIS — R7303 Prediabetes: Secondary | ICD-10-CM | POA: Diagnosis not present

## 2018-05-23 DIAGNOSIS — M5416 Radiculopathy, lumbar region: Secondary | ICD-10-CM | POA: Diagnosis not present

## 2018-05-23 DIAGNOSIS — F419 Anxiety disorder, unspecified: Secondary | ICD-10-CM | POA: Diagnosis not present

## 2018-05-23 DIAGNOSIS — Z87891 Personal history of nicotine dependence: Secondary | ICD-10-CM | POA: Diagnosis not present

## 2018-05-23 DIAGNOSIS — R2689 Other abnormalities of gait and mobility: Secondary | ICD-10-CM | POA: Diagnosis not present

## 2018-05-23 SURGERY — POSTERIOR LUMBAR FUSION 1 LEVEL
Anesthesia: General | Site: Back

## 2018-05-23 MED ORDER — LIDOCAINE-EPINEPHRINE 1 %-1:100000 IJ SOLN
INTRAMUSCULAR | Status: AC
  Filled 2018-05-23: qty 1

## 2018-05-23 MED ORDER — FENTANYL CITRATE (PF) 100 MCG/2ML IJ SOLN
INTRAMUSCULAR | Status: DC | PRN
  Administered 2018-05-23: 50 ug via INTRAVENOUS
  Administered 2018-05-23: 25 ug via INTRAVENOUS
  Administered 2018-05-23: 100 ug via INTRAVENOUS

## 2018-05-23 MED ORDER — PROMETHAZINE HCL 25 MG PO TABS: 25.0000 mg | ORAL_TABLET | ORAL | Status: DC | PRN

## 2018-05-23 MED ORDER — FENTANYL CITRATE (PF) 100 MCG/2ML IJ SOLN
25.0000 ug | INTRAMUSCULAR | Status: DC | PRN
  Administered 2018-05-23 (×3): 50 ug via INTRAVENOUS

## 2018-05-23 MED ORDER — CHLORHEXIDINE GLUCONATE CLOTH 2 % EX PADS: 6.0000 | MEDICATED_PAD | Freq: Once | CUTANEOUS | Status: DC

## 2018-05-23 MED ORDER — ROCURONIUM BROMIDE 50 MG/5ML IV SOSY
PREFILLED_SYRINGE | INTRAVENOUS | Status: DC | PRN
  Administered 2018-05-23 (×2): 20 mg via INTRAVENOUS
  Administered 2018-05-23: 10 mg via INTRAVENOUS
  Administered 2018-05-23: 50 mg via INTRAVENOUS
  Administered 2018-05-23: 30 mg via INTRAVENOUS

## 2018-05-23 MED ORDER — THROMBIN 20000 UNITS EX SOLR
CUTANEOUS | Status: DC | PRN
  Administered 2018-05-23: 12:00:00 via TOPICAL

## 2018-05-23 MED ORDER — MIRTAZAPINE 15 MG PO TABS
15.0000 mg | ORAL_TABLET | Freq: Every day | ORAL | Status: DC
  Administered 2018-05-23 – 2018-05-25 (×3): 15 mg via ORAL
  Filled 2018-05-23 (×5): qty 1

## 2018-05-23 MED ORDER — HYDROCHLOROTHIAZIDE 25 MG PO TABS
25.0000 mg | ORAL_TABLET | Freq: Every day | ORAL | Status: DC
  Administered 2018-05-24 – 2018-05-26 (×3): 25 mg via ORAL
  Filled 2018-05-23 (×3): qty 1

## 2018-05-23 MED ORDER — DIPHENHYDRAMINE HCL 50 MG/ML IJ SOLN
INTRAMUSCULAR | Status: AC
  Filled 2018-05-23: qty 1

## 2018-05-23 MED ORDER — POLYETHYLENE GLYCOL 3350 17 G PO PACK: 17.0000 g | PACK | Freq: Every day | ORAL | Status: DC | PRN

## 2018-05-23 MED ORDER — ROCURONIUM BROMIDE 50 MG/5ML IV SOSY
PREFILLED_SYRINGE | INTRAVENOUS | Status: AC
  Filled 2018-05-23: qty 5

## 2018-05-23 MED ORDER — SUGAMMADEX SODIUM 200 MG/2ML IV SOLN
INTRAVENOUS | Status: DC | PRN
  Administered 2018-05-23: 200 mg via INTRAVENOUS

## 2018-05-23 MED ORDER — PROPOFOL 10 MG/ML IV BOLUS
INTRAVENOUS | Status: DC | PRN
  Administered 2018-05-23: 160 mg via INTRAVENOUS
  Administered 2018-05-23: 40 mg via INTRAVENOUS

## 2018-05-23 MED ORDER — ONDANSETRON HCL 4 MG/2ML IJ SOLN
INTRAMUSCULAR | Status: AC
  Filled 2018-05-23: qty 2

## 2018-05-23 MED ORDER — ACETAMINOPHEN 500 MG PO TABS
1000.0000 mg | ORAL_TABLET | Freq: Once | ORAL | Status: AC
  Administered 2018-05-23: 1000 mg via ORAL
  Filled 2018-05-23: qty 2

## 2018-05-23 MED ORDER — CYCLOBENZAPRINE HCL 10 MG PO TABS: 10.0000 mg | ORAL_TABLET | Freq: Three times a day (TID) | ORAL | Status: DC | PRN

## 2018-05-23 MED ORDER — FENTANYL CITRATE (PF) 250 MCG/5ML IJ SOLN
INTRAMUSCULAR | Status: AC
  Filled 2018-05-23: qty 5

## 2018-05-23 MED ORDER — SODIUM CHLORIDE 0.9% FLUSH
3.0000 mL | Freq: Two times a day (BID) | INTRAVENOUS | Status: DC
  Administered 2018-05-23 – 2018-05-25 (×3): 3 mL via INTRAVENOUS

## 2018-05-23 MED ORDER — BUPIVACAINE HCL (PF) 0.5 % IJ SOLN
INTRAMUSCULAR | Status: DC | PRN
  Administered 2018-05-23: 5 mL

## 2018-05-23 MED ORDER — THROMBIN 20000 UNITS EX SOLR
CUTANEOUS | Status: AC
  Filled 2018-05-23: qty 20000

## 2018-05-23 MED ORDER — FENTANYL CITRATE (PF) 100 MCG/2ML IJ SOLN
INTRAMUSCULAR | Status: AC
  Filled 2018-05-23: qty 2

## 2018-05-23 MED ORDER — PHENOL 1.4 % MT LIQD: 1.0000 | OROMUCOSAL | Status: DC | PRN

## 2018-05-23 MED ORDER — SUCCINYLCHOLINE CHLORIDE 200 MG/10ML IV SOSY
PREFILLED_SYRINGE | INTRAVENOUS | Status: AC
  Filled 2018-05-23: qty 10

## 2018-05-23 MED ORDER — MENTHOL 3 MG MT LOZG
1.0000 | LOZENGE | OROMUCOSAL | Status: DC | PRN
  Filled 2018-05-23: qty 9

## 2018-05-23 MED ORDER — FLEET ENEMA 7-19 GM/118ML RE ENEM: 1.0000 | ENEMA | Freq: Once | RECTAL | Status: DC | PRN

## 2018-05-23 MED ORDER — ACETAMINOPHEN 650 MG RE SUPP: 650.0000 mg | RECTAL | Status: DC | PRN

## 2018-05-23 MED ORDER — DIAZEPAM 2 MG PO TABS
2.0000 mg | ORAL_TABLET | Freq: Three times a day (TID) | ORAL | Status: DC | PRN
  Administered 2018-05-23: 2 mg via ORAL
  Filled 2018-05-23: qty 1

## 2018-05-23 MED ORDER — METHOCARBAMOL 1000 MG/10ML IJ SOLN
500.0000 mg | Freq: Four times a day (QID) | INTRAVENOUS | Status: DC | PRN
  Filled 2018-05-23: qty 5

## 2018-05-23 MED ORDER — THROMBIN 5000 UNITS EX SOLR
OROMUCOSAL | Status: DC | PRN
  Administered 2018-05-23: 12:00:00 via TOPICAL

## 2018-05-23 MED ORDER — DEXAMETHASONE SODIUM PHOSPHATE 10 MG/ML IJ SOLN
INTRAMUSCULAR | Status: AC
  Filled 2018-05-23: qty 1

## 2018-05-23 MED ORDER — DIPHENHYDRAMINE HCL 50 MG/ML IJ SOLN
INTRAMUSCULAR | Status: DC | PRN
  Administered 2018-05-23: 12.5 mg via INTRAVENOUS

## 2018-05-23 MED ORDER — SENNA 8.6 MG PO TABS
1.0000 | ORAL_TABLET | Freq: Two times a day (BID) | ORAL | Status: DC
  Administered 2018-05-23 – 2018-05-26 (×6): 8.6 mg via ORAL
  Filled 2018-05-23 (×5): qty 1

## 2018-05-23 MED ORDER — THROMBIN 5000 UNITS EX SOLR
CUTANEOUS | Status: AC
  Filled 2018-05-23: qty 5000

## 2018-05-23 MED ORDER — SUCCINYLCHOLINE CHLORIDE 20 MG/ML IJ SOLN
INTRAMUSCULAR | Status: DC | PRN
  Administered 2018-05-23: 120 mg via INTRAVENOUS

## 2018-05-23 MED ORDER — ALUM & MAG HYDROXIDE-SIMETH 200-200-20 MG/5ML PO SUSP: 30.0000 mL | Freq: Four times a day (QID) | ORAL | Status: DC | PRN

## 2018-05-23 MED ORDER — LIDOCAINE 2% (20 MG/ML) 5 ML SYRINGE
INTRAMUSCULAR | Status: AC
  Filled 2018-05-23: qty 5

## 2018-05-23 MED ORDER — BUPIVACAINE HCL (PF) 0.5 % IJ SOLN
INTRAMUSCULAR | Status: AC
  Filled 2018-05-23: qty 30

## 2018-05-23 MED ORDER — DEXAMETHASONE SODIUM PHOSPHATE 10 MG/ML IJ SOLN
INTRAMUSCULAR | Status: DC | PRN
  Administered 2018-05-23: 10 mg via INTRAVENOUS

## 2018-05-23 MED ORDER — SODIUM CHLORIDE 0.9% FLUSH: 3.0000 mL | INTRAVENOUS | Status: DC | PRN

## 2018-05-23 MED ORDER — 0.9 % SODIUM CHLORIDE (POUR BTL) OPTIME
TOPICAL | Status: DC | PRN
  Administered 2018-05-23: 1000 mL

## 2018-05-23 MED ORDER — SCOPOLAMINE 1 MG/3DAYS TD PT72
1.0000 | MEDICATED_PATCH | TRANSDERMAL | Status: DC
  Administered 2018-05-23: 1.5 mg via TRANSDERMAL
  Filled 2018-05-23: qty 1

## 2018-05-23 MED ORDER — PROMETHAZINE HCL 25 MG/ML IJ SOLN: 6.2500 mg | INTRAMUSCULAR | Status: DC | PRN

## 2018-05-23 MED ORDER — LACTATED RINGERS IV SOLN: INTRAVENOUS | Status: DC

## 2018-05-23 MED ORDER — PROPOFOL 10 MG/ML IV BOLUS
INTRAVENOUS | Status: AC
  Filled 2018-05-23: qty 20

## 2018-05-23 MED ORDER — OXYCODONE-ACETAMINOPHEN 5-325 MG PO TABS
ORAL_TABLET | ORAL | Status: AC
  Filled 2018-05-23: qty 2

## 2018-05-23 MED ORDER — SODIUM CHLORIDE 0.9 % IV SOLN
INTRAVENOUS | Status: DC | PRN
  Administered 2018-05-23: 12:00:00

## 2018-05-23 MED ORDER — LACTATED RINGERS IV SOLN
INTRAVENOUS | Status: DC
  Administered 2018-05-23 (×2): via INTRAVENOUS

## 2018-05-23 MED ORDER — DULOXETINE HCL 60 MG PO CPEP
120.0000 mg | ORAL_CAPSULE | Freq: Every day | ORAL | Status: DC
  Administered 2018-05-24 – 2018-05-26 (×3): 120 mg via ORAL
  Filled 2018-05-23 (×3): qty 2

## 2018-05-23 MED ORDER — LIDOCAINE-EPINEPHRINE 1 %-1:100000 IJ SOLN
INTRAMUSCULAR | Status: DC | PRN
  Administered 2018-05-23: 5 mL

## 2018-05-23 MED ORDER — DOCUSATE SODIUM 100 MG PO CAPS
100.0000 mg | ORAL_CAPSULE | Freq: Two times a day (BID) | ORAL | Status: DC
  Administered 2018-05-23 – 2018-05-26 (×6): 100 mg via ORAL
  Filled 2018-05-23 (×6): qty 1

## 2018-05-23 MED ORDER — MIDAZOLAM HCL 2 MG/2ML IJ SOLN
INTRAMUSCULAR | Status: AC
  Filled 2018-05-23: qty 2

## 2018-05-23 MED ORDER — MIDAZOLAM HCL 2 MG/2ML IJ SOLN
INTRAMUSCULAR | Status: DC | PRN
  Administered 2018-05-23: 2 mg via INTRAVENOUS

## 2018-05-23 MED ORDER — QUETIAPINE FUMARATE 50 MG PO TABS
25.0000 mg | ORAL_TABLET | Freq: Every day | ORAL | Status: DC
  Administered 2018-05-23 – 2018-05-25 (×3): 25 mg via ORAL
  Filled 2018-05-23 (×3): qty 1

## 2018-05-23 MED ORDER — ROCURONIUM BROMIDE 50 MG/5ML IV SOSY
PREFILLED_SYRINGE | INTRAVENOUS | Status: AC
  Filled 2018-05-23: qty 10

## 2018-05-23 MED ORDER — KETOROLAC TROMETHAMINE 15 MG/ML IJ SOLN: 15.0000 mg | Freq: Four times a day (QID) | INTRAMUSCULAR | Status: DC

## 2018-05-23 MED ORDER — AMLODIPINE BESYLATE 5 MG PO TABS
5.0000 mg | ORAL_TABLET | Freq: Every day | ORAL | Status: DC
  Administered 2018-05-24 – 2018-05-26 (×3): 5 mg via ORAL
  Filled 2018-05-23 (×3): qty 1

## 2018-05-23 MED ORDER — MORPHINE SULFATE (PF) 2 MG/ML IV SOLN
2.0000 mg | INTRAVENOUS | Status: DC | PRN
  Administered 2018-05-23: 4 mg via INTRAVENOUS
  Administered 2018-05-24 (×2): 2 mg via INTRAVENOUS
  Administered 2018-05-24: 4 mg via INTRAVENOUS
  Administered 2018-05-25 (×4): 2 mg via INTRAVENOUS
  Filled 2018-05-23: qty 1
  Filled 2018-05-23 (×2): qty 2
  Filled 2018-05-23 (×2): qty 1
  Filled 2018-05-23: qty 2
  Filled 2018-05-23 (×3): qty 1

## 2018-05-23 MED ORDER — ACETAMINOPHEN 325 MG PO TABS: 650.0000 mg | ORAL_TABLET | ORAL | Status: DC | PRN

## 2018-05-23 MED ORDER — METHOCARBAMOL 500 MG PO TABS
500.0000 mg | ORAL_TABLET | Freq: Four times a day (QID) | ORAL | Status: DC | PRN
  Administered 2018-05-23 – 2018-05-25 (×7): 500 mg via ORAL
  Filled 2018-05-23 (×8): qty 1

## 2018-05-23 MED ORDER — LIDOCAINE 2% (20 MG/ML) 5 ML SYRINGE
INTRAMUSCULAR | Status: DC | PRN
  Administered 2018-05-23: 80 mg via INTRAVENOUS

## 2018-05-23 MED ORDER — OXYCODONE-ACETAMINOPHEN 5-325 MG PO TABS
1.0000 | ORAL_TABLET | ORAL | Status: DC | PRN
  Administered 2018-05-23: 2 via ORAL
  Administered 2018-05-23: 1 via ORAL
  Administered 2018-05-23: 2 via ORAL
  Administered 2018-05-24: 1 via ORAL
  Administered 2018-05-24 (×3): 2 via ORAL
  Administered 2018-05-24: 1 via ORAL
  Administered 2018-05-25 – 2018-05-26 (×7): 2 via ORAL
  Filled 2018-05-23 (×16): qty 2

## 2018-05-23 MED ORDER — BISACODYL 10 MG RE SUPP: 10.0000 mg | Freq: Every day | RECTAL | Status: DC | PRN

## 2018-05-23 MED ORDER — HYDROXYZINE HCL 25 MG PO TABS
50.0000 mg | ORAL_TABLET | ORAL | Status: DC | PRN
  Administered 2018-05-24 – 2018-05-25 (×4): 50 mg via ORAL
  Filled 2018-05-23 (×5): qty 2

## 2018-05-23 MED ORDER — SODIUM CHLORIDE 0.9 % IV SOLN
250.0000 mL | INTRAVENOUS | Status: DC
  Administered 2018-05-24: 06:00:00 via INTRAVENOUS

## 2018-05-23 SURGICAL SUPPLY — 59 items
ADH SKN CLS APL DERMABOND .7 (GAUZE/BANDAGES/DRESSINGS) ×1
APL SRG 60D 8 XTD TIP BNDBL (TIP)
BAG DECANTER FOR FLEXI CONT (MISCELLANEOUS) ×2 IMPLANT
BASKET BONE COLLECTION (BASKET) ×2 IMPLANT
BLADE CLIPPER SURG (BLADE) IMPLANT
BONE MATRIX OSTEOCEL PRO MED (Bone Implant) ×2 IMPLANT
BUR MATCHSTICK NEURO 3.0 LAGG (BURR) ×2 IMPLANT
CAGE COROENT LG 12X9X23-12 (Cage) ×2 IMPLANT
CANISTER SUCT 3000ML PPV (MISCELLANEOUS) ×2 IMPLANT
CONT SPEC 4OZ CLIKSEAL STRL BL (MISCELLANEOUS) ×2 IMPLANT
COVER BACK TABLE 60X90IN (DRAPES) ×2 IMPLANT
COVER WAND RF STERILE (DRAPES) ×2 IMPLANT
DECANTER SPIKE VIAL GLASS SM (MISCELLANEOUS) ×2 IMPLANT
DERMABOND ADVANCED (GAUZE/BANDAGES/DRESSINGS) ×1
DERMABOND ADVANCED .7 DNX12 (GAUZE/BANDAGES/DRESSINGS) ×1 IMPLANT
DEVICE DISSECT PLASMABLAD 3.0S (MISCELLANEOUS) ×1 IMPLANT
DRAPE C-ARM 42X72 X-RAY (DRAPES) ×4 IMPLANT
DRAPE HALF SHEET 40X57 (DRAPES) IMPLANT
DRAPE LAPAROTOMY 100X72X124 (DRAPES) ×2 IMPLANT
DURAPREP 26ML APPLICATOR (WOUND CARE) ×2 IMPLANT
DURASEAL APPLICATOR TIP (TIP) IMPLANT
DURASEAL SPINE SEALANT 3ML (MISCELLANEOUS) IMPLANT
ELECT REM PT RETURN 9FT ADLT (ELECTROSURGICAL) ×2
ELECTRODE REM PT RTRN 9FT ADLT (ELECTROSURGICAL) ×1 IMPLANT
GAUZE 4X4 16PLY RFD (DISPOSABLE) IMPLANT
GAUZE SPONGE 4X4 12PLY STRL (GAUZE/BANDAGES/DRESSINGS) ×2 IMPLANT
GLOVE BIOGEL PI IND STRL 8.5 (GLOVE) ×2 IMPLANT
GLOVE BIOGEL PI INDICATOR 8.5 (GLOVE) ×2
GLOVE ECLIPSE 8.5 STRL (GLOVE) ×4 IMPLANT
GOWN STRL REUS W/ TWL LRG LVL3 (GOWN DISPOSABLE) IMPLANT
GOWN STRL REUS W/ TWL XL LVL3 (GOWN DISPOSABLE) IMPLANT
GOWN STRL REUS W/TWL 2XL LVL3 (GOWN DISPOSABLE) ×4 IMPLANT
GOWN STRL REUS W/TWL LRG LVL3 (GOWN DISPOSABLE)
GOWN STRL REUS W/TWL XL LVL3 (GOWN DISPOSABLE)
HEMOSTAT POWDER KIT SURGIFOAM (HEMOSTASIS) IMPLANT
KIT BASIN OR (CUSTOM PROCEDURE TRAY) ×2 IMPLANT
KIT TURNOVER KIT B (KITS) ×2 IMPLANT
MILL MEDIUM DISP (BLADE) ×2 IMPLANT
NEEDLE HYPO 22GX1.5 SAFETY (NEEDLE) ×2 IMPLANT
NS IRRIG 1000ML POUR BTL (IV SOLUTION) ×2 IMPLANT
PACK LAMINECTOMY NEURO (CUSTOM PROCEDURE TRAY) ×2 IMPLANT
PAD ARMBOARD 7.5X6 YLW CONV (MISCELLANEOUS) ×6 IMPLANT
PATTIES SURGICAL .5 X1 (DISPOSABLE) ×2 IMPLANT
PLASMABLADE 3.0S (MISCELLANEOUS) ×2
ROD RELINE-O LORD 5.5X40 (Rod) ×2 IMPLANT
SCREW LOCK RELINE 5.5 TULIP (Screw) ×4 IMPLANT
SCREW RELINE-O POLY 6.5X45 (Screw) ×4 IMPLANT
SPONGE LAP 4X18 RFD (DISPOSABLE) IMPLANT
SPONGE SURGIFOAM ABS GEL 100 (HEMOSTASIS) ×2 IMPLANT
SUT PROLENE 6 0 BV (SUTURE) IMPLANT
SUT VIC AB 1 CT1 18XBRD ANBCTR (SUTURE) ×1 IMPLANT
SUT VIC AB 1 CT1 8-18 (SUTURE) ×2
SUT VIC AB 2-0 CP2 18 (SUTURE) ×2 IMPLANT
SUT VIC AB 3-0 SH 8-18 (SUTURE) ×2 IMPLANT
SYR 3ML LL SCALE MARK (SYRINGE) ×8 IMPLANT
TOWEL GREEN STERILE (TOWEL DISPOSABLE) ×2 IMPLANT
TOWEL GREEN STERILE FF (TOWEL DISPOSABLE) ×2 IMPLANT
TRAY FOLEY MTR SLVR 16FR STAT (SET/KITS/TRAYS/PACK) ×2 IMPLANT
WATER STERILE IRR 1000ML POUR (IV SOLUTION) ×2 IMPLANT

## 2018-05-23 NOTE — Anesthesia Postprocedure Evaluation (Signed)
Anesthesia Post Note  Patient: [name]  Procedure(s) Performed: Lumbar 4-5 Posterior lumbar interbody fusion (N/A Back)     Patient location during evaluation: PACU Anesthesia Type: General Level of consciousness: sedated Pain management: pain level controlled Vital Signs Assessment: post-procedure vital signs reviewed and stable Respiratory status: spontaneous breathing and respiratory function stable Cardiovascular status: stable Postop Assessment: no apparent nausea or vomiting Anesthetic complications: no    Last Vitals:  Vitals:   05/23/18 1717 05/23/18 2033  BP:  122/64  Pulse: 68 73  Resp:    Temp: 36.8 C 37 C  SpO2: 98% 98%    Last Pain:  Vitals:   05/23/18 2033  TempSrc: Oral  PainSc:                  Random Dobrowski DANIEL

## 2018-05-23 NOTE — Progress Notes (Signed)
Patient ID: [name], female   DOB: Mar 21, 1965, 53 y.o.   MRN: [id number] Vital signs are stable.  Patient is awake and arousable.  Moving her lower extremities well back pain is under reasonable control.  Stable postop

## 2018-05-23 NOTE — Anesthesia Procedure Notes (Signed)
Procedure Name: Intubation Date/Time: 05/23/2018 11:30 AM Performed by: Barrington Ellison, CRNA Pre-anesthesia Checklist: Patient identified, Emergency Drugs available, Suction available and Patient being monitored Patient Re-evaluated:Patient Re-evaluated prior to induction Oxygen Delivery Method: Circle System Utilized Preoxygenation: Pre-oxygenation with 100% oxygen Induction Type: IV induction and Rapid sequence Ventilation: Mask ventilation without difficulty Laryngoscope Size: Mac and 3 Grade View: Grade I Tube type: Oral Tube size: 7.0 mm Number of attempts: 1 Airway Equipment and Method: Stylet and Oral airway Placement Confirmation: ETT inserted through vocal cords under direct vision,  positive ETCO2 and breath sounds checked- equal and bilateral Secured at: 22 cm Tube secured with: Tape Dental Injury: Teeth and Oropharynx as per pre-operative assessment

## 2018-05-23 NOTE — Transfer of Care (Signed)
Immediate Anesthesia Transfer of Care Note  Patient: [name]  Procedure(s) Performed: Lumbar 4-5 Posterior lumbar interbody fusion (N/A Back)  Patient Location: PACU  Anesthesia Type:General  Level of Consciousness: awake and oriented  Airway & Oxygen Therapy: Patient Spontanous Breathing  Post-op Assessment: Report given to RN and Patient moving all extremities X 4  Post vital signs: Reviewed and stable  Last Vitals:  Vitals Value Taken Time  BP 132/90 05/23/2018  3:16 PM  Temp    Pulse 105 05/23/2018  3:17 PM  Resp 19 05/23/2018  3:17 PM  SpO2 96 % 05/23/2018  3:17 PM  Vitals shown include unvalidated device data.  Last Pain:  Vitals:   05/23/18 1026  TempSrc:   PainSc: 6       Patients Stated Pain Goal: 3 (61/47/09 2957)  Complications: No apparent anesthesia complications

## 2018-05-23 NOTE — H&P (Signed)
[name] is an 53 y.o. female.   Chief Complaint: Back and bilateral lower extremity pain and weakness HPI: [name] is a 53 year old individual whose had significant surgery in her lumbar spine in the past.  She has previously undergone decompression fusion at L5-S1 for centrally herniated disc and a decompression and fusion at L3-4 for separate process.  Each time she healed well from those surgeries and has done well in regards to her back until the end of 2019 when she developed significant back and bilateral leg pain evaluation in early January demonstrated that the patient had a broad-based bulge of the disc at L4-L5 with significant facet hypertrophy and rather severe lateral recess stenosis.  She was initially treated conservatively with physical therapy and a number of steroid injections which yielded little relief.  After careful consideration of her options I advised that she ultimately will need to have this area decompressed and fused.  In the interim she notes that her legs have become weaker and she has difficulty standing and walking even short distances.  She is now admitted for surgical decompression and stabilization at L4-L5.  Past Medical History:  Diagnosis Date  . Anxiety   . Depression   . Headache   . Hypertension   . Pseudoseizure   . PTSD (post-traumatic stress disorder)   . Rosacea   . Seizures [facility])    [facility] (Dr. 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 Sleep apnea     Past Surgical History:  Procedure Laterality Date  . ABDOMINAL HYSTERECTOMY     non cancerous, partial  . APPENDECTOMY    . appendectomy    . BACK SURGERY     x2  . CHOLECYSTECTOMY    . CHOLECYSTECTOMY    . CYST EXCISION     on ovaries  . lumbar     ruptured disc in lumbar taken out  . ROTATOR CUFF REPAIR Left   . TONSILLECTOMY    . TUBAL LIGATION      Family History  Problem Relation Age of Onset  .  Diabetes Father   . Heart disease Father 23  . Hypertension Father   . Cancer Maternal Grandfather        colon cancer (70's)  . Diabetes Paternal Grandmother   . Diabetes Paternal Grandfather   . Cancer Cousin        breast  . Breast cancer Cousin   . Breast cancer Paternal Aunt   . Breast cancer Cousin   . Breast cancer Cousin    Social History:  reports that she quit smoking about 27 years ago. Her smoking use included cigarettes. She has a 5.00 pack-year smoking history. She has never used smokeless tobacco. She reports that she does not drink alcohol or use drugs.  Allergies:  Allergies  Allergen Reactions  . Penicillins Hives    Has patient had a PCN reaction causing immediate rash, facial/tongue/throat swelling, SOB or lightheadedness with hypotension: Yes Has patient had a PCN reaction causing severe rash involving mucus membranes or skin necrosis: No Has patient had a PCN reaction that required hospitalization No Has patient had a PCN reaction occurring within the last 10 years: No If all of the above answers are "NO", then may proceed with Cephalosporin use.    . Toradol [Ketorolac Tromethamine] Hives  . Lamotrigine Rash  . Zofran [Ondansetron Hcl] Itching    Medications Prior to Admission  Medication Sig Dispense Refill  .  AIMOVIG 140 MG/ML SOAJ Inject 140 mg into the skin every 28 (twenty-eight) days.   5  . amLODipine (NORVASC) 5 MG tablet Take 1 tablet (5 mg total) by mouth daily. 90 tablet 3  . cyclobenzaprine (FLEXERIL) 10 MG tablet TAKE 1 TABLET BY MOUTH THREE TIMES A DAY AS NEEDED FOR MUSCLE SPASMS (Patient taking differently: Take 10 mg by mouth 3 (three) times daily as needed for muscle spasms. ) 30 tablet 0  . diazepam (VALIUM) 2 MG tablet Take 2 mg by mouth every 8 (eight) hours as needed for anxiety.     . DULoxetine (CYMBALTA) 60 MG capsule Take 120 mg by mouth daily.    . hydrochlorothiazide (HYDRODIURIL) 25 MG tablet Take 1 tablet (25 mg total) by  mouth daily. 90 tablet 3  . hydrOXYzine (ATARAX/VISTARIL) 50 MG tablet Take 1 tablet (50 mg total) by mouth every 4 (four) hours as needed for itching. 90 tablet 0  . mirtazapine (REMERON) 15 MG tablet Take 15 mg by mouth at bedtime.   1  . QUEtiapine (SEROQUEL) 25 MG tablet Take 25 mg by mouth at bedtime.     . Cyanocobalamin (B-12 PO) Take 1 tablet by mouth daily as needed (fatigue).     Marland Kitchen HYDROcodone-acetaminophen (NORCO) 5-325 MG tablet Take 1 tablet by mouth every 6 (six) hours as needed for moderate pain. (Patient not taking: Reported on 05/08/2018) 30 tablet 0  . predniSONE (DELTASONE) 20 MG tablet 3 tabs poqday 1-2, 2 tabs poqday 3-4, 1 tab poqday 5-6 (Patient not taking: Reported on 05/08/2018) 12 tablet 0  . promethazine (PHENERGAN) 25 MG suppository Place 1 suppository (25 mg total) rectally every 6 (six) hours as needed for nausea or vomiting. (Patient not taking: Reported on 05/08/2018) 10 each 0    No results found for this or any previous visit (from the past 48 hour(s)). No results found.  Review of Systems  Constitutional: Negative.   HENT: Negative.   Eyes: Negative.   Respiratory: Negative.   Cardiovascular: Negative.   Gastrointestinal: Negative.   Genitourinary: Negative.   Musculoskeletal: Positive for back pain.  Skin: Negative.   Neurological: Positive for sensory change and focal weakness.  Endo/Heme/Allergies: Negative.   Psychiatric/Behavioral: Negative.     Blood pressure 139/88, pulse 91, temperature 98.9 F (37.2 C), temperature source Oral, resp. rate 18, height 5' 6.5" (1.689 m), weight 103.4 kg, SpO2 97 %. Physical Exam  Constitutional: She is oriented to person, place, and time. She appears well-developed and well-nourished.  HENT:  Head: Normocephalic and atraumatic.  Eyes: Pupils are equal, round, and reactive to light. Conjunctivae and EOM are normal.  Neck: Normal range of motion. Neck supple.  Cardiovascular: Normal rate and regular rhythm.   Respiratory: Effort normal and breath sounds normal.  GI: Soft. Bowel sounds are normal.  Musculoskeletal: Normal range of motion.  Neurological: She is alert and oriented to person, place, and time.  Cranial nerve examination is within the limits of normal.  Upper extremity strength is normal with normal tone and bulk in the deltoids biceps triceps grips and intrinsics.  In the lower extremities she has good strength in iliopsoas and quadriceps bilaterally.  There is moderate weakness in the tibialis anterior graded 4- out of 5.  Tone and bulk are intact.  Gastroc strength is intact.  Patellar reflexes are 1+ Achilles reflexes are absent bilaterally.  Skin: Skin is warm and dry.  Psychiatric: She has a normal mood and affect. Her behavior is normal. Thought  content normal.     Assessment/Plan Spondylosis and stenosis L4-L5 secondary to facet hypertrophy and chronic disc bulge.  History of fusion L3-4 and L5-S1.  Procedure: Decompression and fusion L4-L5 with pedicle fixation.  Earleen Newport, MD 05/23/2018, 11:07 AM

## 2018-05-23 NOTE — Op Note (Signed)
Date of surgery: 05/23/2018 Preoperative diagnosis: Lumbar stenosis L4-L5 history of fusion L3-4 and L5-S1, lumbar radiculopathy, neurogenic claudication Operative diagnosis: Same Procedure: Laminectomy and decompression L4 and L5 with posterior lumbar interbody arthrodesis L4-L5 using peek spacers local autograft and allograft .  Posterior lateral arthrodesis with local autograft and allograft L4-L5 pedicle screw fixation L4-L5 Surgeon: Kristeen Miss Anesthesia: General endotracheal Indications: [name] is a 53 year old individual who has had previous lumbar spinal surgery with decompressions and stabilizations at L3-4 and L5-S1.  L4-5 it always been healthy but recently she is developed significant pain and now weakness in the lower extremities.  She is advised regarding surgical decompression and stabilization of L4-L5.  Previous fusions were done with spire plate fixation posteriorly.  Procedure: Patient was brought to the operating room supine on the stretcher.  After the smooth induction of general endotracheal anesthesia, she was turned prone onto the operating table.  The back was prepped with alcohol DuraPrep and draped in a sterile fashion midline incision was created through the previous incision this was carried down to the lumbodorsal fascia the fascia was opened on either side and the superior spire plate was identified.  Dissection was carried inferiorly and the inferior spire plate was identified.  The superior spire plate would create an obstruction to the laminectomy and it was removed.  Then a laminectomy was created removing the inferior margin lamina of L4 out to and including the entirety of the facet at L4-L5.  The L ligament was thickened and redundant and this was carefully taken up to create a decompressive laminectomy decompress the path of the common dural tube and the L5 nerve root inferiorly and the L4 nerve root superiorly this was done carefully with a 2 and 3 mm Kerrison  punch and high-speed drill.  Once an adequate decompression was performed the disc spaces were isolated at L4-L5.  Then a complete discectomy was performed using a series of dilators and rongeurs to remove degenerated disc disc shavers were used for this purpose ultimately the interspace was sized for appropriate size spacer and was felt that a 12 mm tall 12 degree lordotic 23 mm deep spacer would fit into the center of all the posterior height of the spacer was 9 mm.  To the spacers were filled with autograft and allograft the allograft used was ostial cell and was mixed with the autograft.  A total of 9 cc of autograft and allograft mixture was then packed into the interspace alongside the spacers.  Next care was taken to identify pedicle entry sites at L4 and L5 and 6.5 x 45 mm screws were placed into the pedicles at L4 and L5 with radiographic confirmation.  40 mm precontoured rods were then used to connect the screws together in a slightly lordotic construct.  Once this was completed the lateral gutters had been packed with the remainder of the autograft and allograft mixture for a total of about 6 cc in each lateral gutter once this was completed care was taken to make sure that the L4 and L5 nerve roots were well decompressed hemostasis was well achieved the retractors were removed and the lumbodorsal fascia was reapproximated with #1 Vicryl in interrupted fashion 2-0 Vicryl was used in the subcutaneous tissues 25 cc of half percent Marcaine was injected into the paraspinous fascia and 3-0 Vicryl was used in subcuticular tissues Dermabond was placed in the skin patient tolerated procedure well blood loss was estimated 250 cc.

## 2018-05-23 NOTE — Anesthesia Preprocedure Evaluation (Signed)
Anesthesia Evaluation  Patient identified by MRN, date of birth, ID band Patient awake    Reviewed: Allergy & Precautions, NPO status , Patient's Chart, lab work & pertinent test results  History of Anesthesia Complications Negative for: history of anesthetic complications  Airway Mallampati: III  TM Distance: >3 FB Neck ROM: Full    Dental no notable dental hx. (+) Dental Advisory Given   Pulmonary sleep apnea , former smoker,    Pulmonary exam normal        Cardiovascular hypertension, Pt. on medications Normal cardiovascular exam     Neuro/Psych Seizures -, Well Controlled,  PSYCHIATRIC DISORDERS Anxiety Depression Bipolar Disorder    GI/Hepatic negative GI ROS, Neg liver ROS,   Endo/Other  Morbid obesity  Renal/GU negative Renal ROS  negative genitourinary   Musculoskeletal negative musculoskeletal ROS (+)   Abdominal   Peds negative pediatric ROS (+)  Hematology negative hematology ROS (+)   Anesthesia Other Findings   Reproductive/Obstetrics negative OB ROS                             Anesthesia Physical Anesthesia Plan  ASA: III  Anesthesia Plan: General   Post-op Pain Management:    Induction: Intravenous  PONV Risk Score and Plan: 4 or greater and Ondansetron, Dexamethasone, Scopolamine patch - Pre-op and Diphenhydramine  Airway Management Planned: Oral ETT  Additional Equipment:   Intra-op Plan:   Post-operative Plan: Extubation in OR  Informed Consent: I have reviewed the patients History and Physical, chart, labs and discussed the procedure including the risks, benefits and alternatives for the proposed anesthesia with the patient or authorized representative who has indicated his/her understanding and acceptance.     Dental advisory given  Plan Discussed with: CRNA and Anesthesiologist  Anesthesia Plan Comments:         Anesthesia Quick  Evaluation

## 2018-05-24 LAB — BASIC METABOLIC PANEL
Anion gap: 7 (ref 5–15)
BUN: 9 mg/dL (ref 6–20)
CO2: 28 mmol/L (ref 22–32)
Calcium: 8.1 mg/dL — ABNORMAL LOW (ref 8.9–10.3)
Chloride: 103 mmol/L (ref 98–111)
Creatinine, Ser: 0.76 mg/dL (ref 0.44–1.00)
GFR calc Af Amer: >60 mL/min (ref >60)
Glucose, Bld: 161 mg/dL — ABNORMAL HIGH (ref 70–99)
Potassium: 3.9 mmol/L (ref 3.5–5.1)
Sodium: 138 mmol/L (ref 135–145)

## 2018-05-24 LAB — CBC
HCT: 33.6 % — ABNORMAL LOW (ref 36.0–46.0)
Hemoglobin: 10.6 g/dL — ABNORMAL LOW (ref 12.0–15.0)
MCH: 28.6 pg (ref 26.0–34.0)
MCHC: 31.5 g/dL (ref 30.0–36.0)
MCV: 90.6 fL (ref 80.0–100.0)
PLATELETS: 274 10*3/uL (ref 150–400)
RBC: 3.71 MIL/uL — ABNORMAL LOW (ref 3.87–5.11)
RDW: 13.6 % (ref 11.5–15.5)
WBC: 13.1 10*3/uL — ABNORMAL HIGH (ref 4.0–10.5)
nRBC: 0 % (ref 0.0–0.2)

## 2018-05-24 MED ORDER — SODIUM CHLORIDE 0.9% FLUSH
10.0000 mL | Freq: Two times a day (BID) | INTRAVENOUS | Status: DC
  Administered 2018-05-24: 20 mL
  Administered 2018-05-24 – 2018-05-26 (×4): 10 mL

## 2018-05-24 MED ORDER — KETOROLAC TROMETHAMINE 15 MG/ML IJ SOLN
15.0000 mg | Freq: Four times a day (QID) | INTRAMUSCULAR | Status: DC | PRN
  Administered 2018-05-24 – 2018-05-25 (×3): 15 mg via INTRAVENOUS
  Filled 2018-05-24 (×3): qty 1

## 2018-05-24 MED ORDER — KETOROLAC TROMETHAMINE 15 MG/ML IJ SOLN: 15.0000 mg | Freq: Four times a day (QID) | INTRAMUSCULAR | Status: DC

## 2018-05-24 MED ORDER — SODIUM CHLORIDE 0.9% FLUSH: 10.0000 mL | INTRAVENOUS | Status: DC | PRN

## 2018-05-24 NOTE — Evaluation (Signed)
Physical Therapy Evaluation Patient Details Name: [name] MRN: [id number] DOB: 02-05-1966 Today's Date: 05/24/2018   History of Present Illness  53 year old individual whose had significant surgery in her lumbar spine in the past.  She has previously undergone decompression fusion at L5-S1 for centrally herniated disc and a decompression and fusion at L3-4 for separate process. January demonstrated that the patient had a broad-based bulge of the disc at L4-L5 with significant facet hypertrophy and rather severe lateral recess stenosis. s/p Lumbar 4-5 Posterior lumbar interbody fusion 05/23/18   Clinical Impression  Patient is s/p above surgery resulting in functional limitations due to the deficits listed below (see PT Problem List). Pt is limited in safe mobility by low back pain and radiating pain down L LE, as well as decreased strength and endurance. Pt is currently min A for transfers and min guard for ambulation of 100 feet without AD. Patient will benefit from skilled PT to increase their independence and safety with mobility to allow discharge to the venue listed below.       Follow Up Recommendations Home health PT;Supervision/Assistance - 24 hour    Equipment Recommendations  Rolling walker with 5" wheels;3in1 (PT)       Precautions / Restrictions Precautions Precautions: Back Precaution Booklet Issued: Yes (comment) Required Braces or Orthoses: Spinal Brace Spinal Brace: Lumbar corset;Applied in sitting position Restrictions Weight Bearing Restrictions: No      Mobility  Bed Mobility               General bed mobility comments: OOB walking in hallway with NT   Transfers Overall transfer level: Needs assistance   Transfers: Sit to/from Stand Sit to Stand: Min assist         General transfer comment: minA for assisting UE to chair arms, vc for not twisting to find them  Ambulation/Gait Ambulation/Gait assistance: Min guard Gait Distance (Feet): 80  Feet Assistive device: None Gait Pattern/deviations: Step-through pattern;Decreased stride length;Shuffle;Antalgic Gait velocity: slowed Gait velocity interpretation: 1.31 - 2.62 ft/sec, indicative of limited community ambulator General Gait Details: hands on min guard for safety with slow shuffling gait secondary to pain in lower back as well as some pain down L LE      Balance Overall balance assessment: Mild deficits observed, not formally tested                                           Pertinent Vitals/Pain Pain Assessment: 0-10 Pain Score: 7  Pain Location: low back pain, nerve pain down L LE Pain Descriptors / Indicators: Shooting;Grimacing;Guarding;Tingling Pain Intervention(s): Limited activity within patient's tolerance;Monitored during session;Repositioned;Patient requesting pain meds-RN notified    Home Living Family/patient expects to be discharged to:: Private residence Living Arrangements: Spouse/significant other Available Help at Discharge: Available 24 hours/day;Family Type of Home: House Home Access: Stairs to enter Entrance Stairs-Rails: Right Entrance Stairs-Number of Steps: 3 Home Layout: One level Home Equipment: Hand held shower head;Shower seat - built in      Prior Function Level of Independence: Needs assistance   Gait / Transfers Assistance Needed: only able to tolerate short distance ambulation and standing without AD  ADL's / Homemaking Assistance Needed: able to bathe and dress, husband responsible for shopping and chores           Extremity/Trunk Assessment   Upper Extremity Assessment Upper Extremity Assessment: Overall WFL for tasks assessed  Lower Extremity Assessment Lower Extremity Assessment: LLE deficits/detail LLE Deficits / Details: nerve pain and tingling down L LE, ROM WFL LLE: Unable to fully assess due to pain       Communication   Communication: No difficulties  Cognition Arousal/Alertness:  Awake/alert Behavior During Therapy: WFL for tasks assessed/performed Overall Cognitive Status: Within Functional Limits for tasks assessed                                               Assessment/Plan    PT Assessment Patient needs continued PT services  PT Problem List Decreased activity tolerance;Decreased mobility;Pain       PT Treatment Interventions DME instruction;Gait training;Stair training;Functional mobility training;Therapeutic activities;Therapeutic exercise;Balance training;Cognitive remediation;Patient/family education    PT Goals (Current goals can be found in the Care Plan section)  Acute Rehab PT Goals Patient Stated Goal: have less pain PT Goal Formulation: With patient Time For Goal Achievement: 06/07/18 Potential to Achieve Goals: Good    Frequency Min 5X/week    AM-PAC PT "6 Clicks" Mobility  Outcome Measure Help needed turning from your back to your side while in a flat bed without using bedrails?: A Little Help needed moving from lying on your back to sitting on the side of a flat bed without using bedrails?: A Little Help needed moving to and from a bed to a chair (including a wheelchair)?: A Little Help needed standing up from a chair using your arms (e.g., wheelchair or bedside chair)?: A Little Help needed to walk in hospital room?: A Little Help needed climbing 3-5 steps with a railing? : A Lot 6 Click Score: 17    End of Session Equipment Utilized During Treatment: Gait belt;Back brace Activity Tolerance: Patient limited by pain Patient left: in chair;with call bell/phone within reach Nurse Communication: Mobility status;Patient requests pain meds;Precautions PT Visit Diagnosis: Other abnormalities of gait and mobility (R26.89);Muscle weakness (generalized) (M62.81);Difficulty in walking, not elsewhere classified (R26.2);Pain;Other symptoms and signs involving the nervous system (R29.898) Pain - part of body: (back and L  LEback and L LE)    Time: 1694-5038 PT Time Calculation (min) (ACUTE ONLY): 17 min   Charges:   PT Evaluation $PT Eval Low Complexity: 1 Low          Mont Jagoda B. Migdalia Dk PT, DPT Acute Rehabilitation Services Pager [phone] Office [phone]   [place] 05/24/2018, 10:39 AM

## 2018-05-24 NOTE — Progress Notes (Signed)
Orthopedic Tech Progress Note Patient Details:  [name] 29-Mar-1965 [id number] Called to order brace and they said it had been delivered yesterday.   Patient ID: [name], female   DOB: Jan 06, 1966, 53 y.o.   MRN: [id number]   Janit Pagan 05/24/2018, 8:38 AM

## 2018-05-24 NOTE — Progress Notes (Signed)
Patient ID: [name], female   DOB: 02-Oct-1965, 53 y.o.   MRN: [id number] Vital signs are stable Motor functions are intact in lower extremities Dressings are clean and dry Complains of some numbness into the left lower extremity Also has moderate back soreness I have encouraged her to try the Toradol.  She can use the Atarax for itching if this should occur Please with progress for day 1 Labs postop demonstrate acute blood loss anemia with 3 g loss of hemoglobin however patient's hemoglobin and vital signs and activity level did not suggest any need for transfusion We will continue to monitor glucose noted to be 161 today

## 2018-05-24 NOTE — Evaluation (Signed)
Occupational Therapy Evaluation Patient Details Name: [name] MRN: [id number] DOB: 1965-03-27 Today's Date: 05/24/2018    History of Present Illness 53 year old individual whose had significant surgery in her lumbar spine in the past.  She has previously undergone decompression fusion at L5-S1 for centrally herniated disc and a decompression and fusion at L3-4 for separate process. January demonstrated that the patient had a broad-based bulge of the disc at L4-L5 with significant facet hypertrophy and rather severe lateral recess stenosis. s/p Lumbar 4-5 Posterior lumbar interbody fusion 05/23/18    Clinical Impression   Pt admitted with above. She demonstrates the below listed deficits and will benefit from continued OT to maximize safety and independence with BADLs.  Pt requires mod A for LB ADLs due to inability to access feet without bending.  She will likely require use of AE.  She lives with spouse, who works during the day so will need to be as independent as possible at discharge.  Will follow.       Follow Up Recommendations  Home health OT;Supervision - Intermittent    Equipment Recommendations  3 in 1 bedside commode;Tub/shower bench    Recommendations for Other Services       Precautions / Restrictions Precautions Precautions: Back Precaution Booklet Issued: Yes (comment) Required Braces or Orthoses: Spinal Brace Spinal Brace: Lumbar corset;Applied in sitting position Restrictions Weight Bearing Restrictions: No      Mobility Bed Mobility Overal bed mobility: Needs Assistance Bed Mobility: Sit to Sidelying         Sit to sidelying: Min assist General bed mobility comments: assist for lifting LEs onto bed   Transfers Overall transfer level: Needs assistance   Transfers: Sit to/from Stand;Stand Pivot Transfers Sit to Stand: Min assist Stand pivot transfers: Min assist       General transfer comment: min A to steady and for safety     Balance Overall  balance assessment: Mild deficits observed, not formally tested                                         ADL either performed or assessed with clinical judgement   ADL Overall ADL's : Needs assistance/impaired Eating/Feeding: Independent   Grooming: Wash/dry hands;Wash/dry face;Oral care;Brushing hair;Min guard;Standing   Upper Body Bathing: Set up;Supervision/ safety;Sitting   Lower Body Bathing: Moderate assistance;Sit to/from stand Lower Body Bathing Details (indicate cue type and reason): unable to access feet  Upper Body Dressing : Set up;Sitting   Lower Body Dressing: Sit to/from stand;Maximal assistance Lower Body Dressing Details (indicate cue type and reason): unable to access feet  Toilet Transfer: Ambulation;Grab bars;Minimal assistance   Toileting- Clothing Manipulation and Hygiene: Moderate assistance;Sit to/from stand       Functional mobility during ADLs: Minimal assistance General ADL Comments: requires min A to move sit to stand.  Pt unable to perform figure 4, and typically bends forward to access feet for LB ADLs      Vision         Perception     Praxis      Pertinent Vitals/Pain Pain Assessment: 0-10 Pain Score: 6  Pain Location: low back pain, nerve pain down L LE Pain Descriptors / Indicators: Shooting;Grimacing;Guarding;Tingling Pain Intervention(s): Limited activity within patient's tolerance;Monitored during session     Hand Dominance     Extremity/Trunk Assessment Upper Extremity Assessment Upper Extremity Assessment: Overall WFL for tasks assessed  Lower Extremity Assessment Lower Extremity Assessment: Defer to PT evaluation       Communication Communication Communication: No difficulties   Cognition Arousal/Alertness: Awake/alert Behavior During Therapy: WFL for tasks assessed/performed Overall Cognitive Status: Within Functional Limits for tasks assessed                                 General  Comments: 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    General Comments       Exercises     Shoulder Instructions      Home Living Family/patient expects to be discharged to:: Private residence Living Arrangements: Spouse/significant other Available Help at Discharge: Available 24 hours/day;Family Type of Home: House Home Access: Stairs to enter [employer] of Steps: 3 Entrance Stairs-Rails: Right Home Layout: One level     Bathroom Shower/Tub: Tub/shower unit;Walk-in shower;Curtain(uses tub/shower due to better h20 pressure )   Bathroom Toilet: Standard Bathroom Accessibility: Yes   Home Equipment: Hand held shower head;Shower seat - built in          Prior Functioning/Environment Level of Independence: Needs assistance  Gait / Transfers Assistance Needed: only able to tolerate short distance ambulation and standing without AD ADL's / Homemaking Assistance Needed: able to bathe and dress, husband responsible for shopping and chores   Comments: Pt reports she has PTSD, pseudoseizures, and conversion disorder         OT Problem List: Decreased strength;Impaired balance (sitting and/or standing);Decreased safety awareness;Decreased knowledge of use of DME or AE;Decreased knowledge of precautions;Obesity;Pain      OT Treatment/Interventions: Self-care/ADL training;DME and/or AE instruction;Therapeutic activities;Patient/family education    OT Goals(Current goals can be found in the care plan section) Acute Rehab OT Goals Patient Stated Goal: to feel better  OT Goal Formulation: With patient Time For Goal Achievement: 06/07/18 Potential to Achieve Goals: Good ADL Goals Pt Will Perform Grooming: with supervision;standing Pt Will Perform Lower Body Bathing: with supervision;sit to/from stand;with adaptive equipment Pt Will Perform Lower Body Dressing: with supervision;sit to/from stand;with adaptive equipment Pt Will Transfer to Toilet: with  supervision;ambulating;regular height toilet;bedside commode;grab bars Pt Will Perform Toileting - Clothing Manipulation and hygiene: with supervision;with adaptive equipment;sit to/from stand Pt Will Perform Tub/Shower Transfer: Tub transfer;with min guard assist;tub bench;ambulating  OT Frequency: Min 2X/week   Barriers to D/C:    spouse works during the day        Co-evaluation              AM-PAC OT "6 Clicks" Daily Activity     Outcome Measure Help from another person eating meals?: None Help from another person taking care of personal grooming?: A Little Help from another person toileting, which includes using toliet, bedpan, or urinal?: A Lot Help from another person bathing (including washing, rinsing, drying)?: A Lot Help from another person to put on and taking off regular upper body clothing?: A Little Help from another person to put on and taking off regular lower body clothing?: A Lot 6 Click Score: 16   End of Session Equipment Utilized During Treatment: Gait belt;Back brace Nurse Communication: Mobility status;Patient requests pain meds  Activity Tolerance: Patient limited by fatigue Patient left: in bed;with call bell/phone within reach;with bed alarm set  OT Visit Diagnosis: Pain Pain - part of body: (back )                Time: 4081-4481 OT Time Calculation (min):  15 min Charges:  OT General Charges $OT Visit: 1 Visit OT Evaluation $OT Eval Moderate Complexity: 1 Mod  Lucille Passy, OTR/L [place] Pager [phone] Office [phone]   Lucille Passy M 05/24/2018, 5:50 PM

## 2018-05-25 ENCOUNTER — Encounter (HOSPITAL_COMMUNITY): Payer: Self-pay | Admitting: General Practice

## 2018-05-25 LAB — GLUCOSE, CAPILLARY: Glucose-Capillary: 144 mg/dL — ABNORMAL HIGH (ref 70–99)

## 2018-05-25 MED ORDER — DEXAMETHASONE 2 MG PO TABS
2.0000 mg | ORAL_TABLET | Freq: Two times a day (BID) | ORAL | Status: DC
  Administered 2018-05-25 – 2018-05-26 (×3): 2 mg via ORAL
  Filled 2018-05-25 (×3): qty 1

## 2018-05-25 MED ORDER — LORATADINE 10 MG PO TABS
10.0000 mg | ORAL_TABLET | Freq: Every day | ORAL | Status: DC | PRN
  Administered 2018-05-25: 10 mg via ORAL
  Filled 2018-05-25: qty 1

## 2018-05-25 NOTE — Progress Notes (Signed)
Physical Therapy Treatment Patient Details Name: [name] MRN: [id number] DOB: May 16, 1965 Today's Date: 05/25/2018    History of Present Illness 53 year old individual whose had significant surgery in her lumbar spine in the past.  She has previously undergone decompression fusion at L5-S1 for centrally herniated disc and a decompression and fusion at L3-4 for separate process. January demonstrated that the patient had a broad-based bulge of the disc at L4-L5 with significant facet hypertrophy and rather severe lateral recess stenosis. s/p Lumbar 4-5 Posterior lumbar interbody fusion 05/23/18     PT Comments    Patient seen for mobility progression. Pt overall supervision/min guard for safety with transfer/gait/stair training this session. Current plan remains appropriate.   Follow Up Recommendations  Home health PT;Supervision/Assistance - 24 hour     Equipment Recommendations  Rolling walker with 5" wheels;3in1 (PT)    Recommendations for Other Services       Precautions / Restrictions Precautions Precautions: Back Precaution Booklet Issued: Yes (comment) Precaution Comments: reviewed precautions Required Braces or Orthoses: Spinal Brace Spinal Brace: Lumbar corset;Applied in sitting position Restrictions Weight Bearing Restrictions: No    Mobility  Bed Mobility       General bed mobility comments: pt OOB in chair upon arrival  Transfers Overall transfer level: Needs assistance Equipment used: Rolling walker (2 wheeled) Transfers: Sit to/from Stand Sit to Stand: Min guard; supervision        General transfer comment: for safety; cues for safe hand placement when sitting  Ambulation/Gait Ambulation/Gait assistance: Min guard Gait Distance (Feet): 200 Feet Assistive device: Rolling walker (2 wheeled) Gait Pattern/deviations: Step-through pattern;Decreased stride length Gait velocity: slowed   General Gait Details: decreased cadence but steady gait and pt with  good upright posture   Stairs Stairs: Yes Stairs assistance: Min guard Stair Management: One rail Left;Step to pattern;Sideways Number of Stairs: 4 General stair comments: cues for sequencing and technique   Wheelchair Mobility    Modified Rankin (Stroke Patients Only)       Balance Overall balance assessment: Mild deficits observed, not formally tested                                          Cognition Arousal/Alertness: Awake/alert Behavior During Therapy: WFL for tasks assessed/performed Overall Cognitive Status: Within Functional Limits for tasks assessed                                 General Comments: 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       Exercises      General Comments        Pertinent Vitals/Pain Pain Assessment: 0-10 Pain Score: 6 (7 after mobility) Pain Location: back pain and tingling down L LE Pain Descriptors / Indicators: Guarding;Tingling;Sore Pain Intervention(s): Limited activity within patient's tolerance;Monitored during session;Premedicated before session;Repositioned    Home Living                      Prior Function            PT Goals (current goals can now be found in the care plan section) Acute Rehab PT Goals Patient Stated Goal: to feel better  Progress towards PT goals: Progressing toward goals    Frequency    Min 5X/week      PT Plan Current  plan remains appropriate    Co-evaluation              AM-PAC PT "6 Clicks" Mobility   Outcome Measure  Help needed turning from your back to your side while in a flat bed without using bedrails?: A Little Help needed moving from lying on your back to sitting on the side of a flat bed without using bedrails?: A Little Help needed moving to and from a bed to a chair (including a wheelchair)?: A Little Help needed standing up from a chair using your arms (e.g., wheelchair or bedside chair)?: A Little Help needed to  walk in hospital room?: A Little Help needed climbing 3-5 steps with a railing? : A Little 6 Click Score: 18    End of Session Equipment Utilized During Treatment: Gait belt;Back brace Activity Tolerance: Patient tolerated treatment well Patient left: in chair;with call bell/phone within reach;with chair alarm set Nurse Communication: Mobility status PT Visit Diagnosis: Other abnormalities of gait and mobility (R26.89);Muscle weakness (generalized) (M62.81);Difficulty in walking, not elsewhere classified (R26.2);Pain;Other symptoms and signs involving the nervous system (R29.898) Pain - part of body: (back and L LE)     Time: 5956-3875 PT Time Calculation (min) (ACUTE ONLY): 19 min  Charges:  $Gait Training: 8-22 mins                     Earney Navy, PTA Acute Rehabilitation Services Pager: [phone] Office: [phone]     Darliss Cheney 05/25/2018, 11:30 AM

## 2018-05-25 NOTE — Plan of Care (Signed)
  Problem: Education: Goal: Ability to verbalize activity precautions or restrictions will improve Outcome: Progressing Goal: Knowledge of the prescribed therapeutic regimen will improve Outcome: Progressing

## 2018-05-25 NOTE — Evaluation (Signed)
Occupational Therapy Evaluation Patient Details Name: [name] MRN: [id number] DOB: 09-Nov-1965 Today's Date: 05/25/2018    History of Present Illness 53 year old individual whose had significant surgery in her lumbar spine in the past.  She has previously undergone decompression fusion at L5-S1 for centrally herniated disc and a decompression and fusion at L3-4 for separate process. January demonstrated that the patient had a broad-based bulge of the disc at L4-L5 with significant facet hypertrophy and rather severe lateral recess stenosis. s/p Lumbar 4-5 Posterior lumbar interbody fusion 05/23/18    Clinical Impression   Patient reported the plan is to have the husband assist in AM or PM and he will be going to work during the day. Patient was educated about use of AE to complete LE ADLS. Patient was able to complete LE ADLS with min assist with AE. It was recommended at this time they will require a long handle sponge, reacher, sock aide, toilet aide and long handle shoe horn. Patient required min guard when completing toilet transfer with grab bars and elevated surface.  Patient was able to report 3/3 precautions and able to don/doff brace at this time.      Follow Up Recommendations  Home health OT;Supervision - Intermittent    Equipment Recommendations  3 in 1 bedside commode;Tub/shower bench    Recommendations for Other Services       Precautions / Restrictions Precautions Precautions: Back Precaution Booklet Issued: Yes (comment) Required Braces or Orthoses: Spinal Brace Spinal Brace: Lumbar corset;Applied in sitting position Restrictions Weight Bearing Restrictions: No      Mobility Bed Mobility Overal bed mobility: Needs Assistance Bed Mobility: Supine to Sit     Supine to sit: Min guard;HOB elevated        Transfers Overall transfer level: Needs assistance Equipment used: Rolling walker (2 wheeled) Transfers: Sit to/from [facility] Sit to  Stand: Min guard Stand pivot transfers: Min guard            Balance Overall balance assessment: Mild deficits observed, not formally tested                                         ADL either performed or assessed with clinical judgement   ADL Overall ADL's : Needs assistance/impaired Eating/Feeding: Independent   Grooming: Wash/dry hands;Wash/dry face;Supervision/safety;Standing   Upper Body Bathing: Supervision/ safety;Set up;Sitting;Standing;Cueing for sequencing   Lower Body Bathing: Minimal assistance;With adaptive equipment;Cueing for safety;Cueing for sequencing;Sit to/from stand   Upper Body Dressing : Supervision/safety;Set up;Cueing for safety;Cueing for sequencing;Sitting;Standing   Lower Body Dressing: Minimal assistance;With adaptive equipment;Cueing for safety;Cueing for sequencing;Sit to/from stand   Toilet Transfer: Min guard;Grab bars   Toileting- Water quality scientist and Hygiene: Minimal assistance;Cueing for safety;Cueing for sequencing       Functional mobility during ADLs: Min guard;Rolling walker;Cueing for safety;Cueing for sequencing General ADL Comments: Pt cued for hand positioning      Vision   Vision Assessment?: No apparent visual deficits     Perception     Praxis      Pertinent Vitals/Pain Pain Assessment: 0-10 Pain Score: 7  Pain Location: low back pain, nerve pain down L LE Pain Descriptors / Indicators: Aching;Guarding Pain Intervention(s): Limited activity within patient's tolerance;Monitored during session;Premedicated before session     Hand Dominance     Extremity/Trunk Assessment Upper Extremity Assessment Upper Extremity Assessment: Overall WFL for tasks assessed  Lower Extremity Assessment Lower Extremity Assessment: Defer to PT evaluation LLE Deficits / Details: nerve pain and tingling down L LE, ROM WFL LLE: Unable to fully assess due to pain       Communication     Cognition  Arousal/Alertness: Awake/alert Behavior During Therapy: WFL for tasks assessed/performed Overall Cognitive Status: Within Functional Limits for tasks assessed                                     General Comments       Exercises     Shoulder Instructions      Home Living                                          Prior Functioning/Environment                   OT Problem List:        OT Treatment/Interventions:      OT Goals(Current goals can be found in the care plan section) Acute Rehab OT Goals Patient Stated Goal: to have decrease pain OT Goal Formulation: With patient Time For Goal Achievement: 06/07/18 Potential to Achieve Goals: Good ADL Goals Pt Will Perform Grooming: with modified independence;standing Pt Will Perform Lower Body Bathing: with supervision;sit to/from stand;with adaptive equipment Pt Will Perform Lower Body Dressing: with supervision;sit to/from stand;with adaptive equipment Pt Will Transfer to Toilet: with supervision;ambulating;regular height toilet;bedside commode;grab bars Pt Will Perform Toileting - Clothing Manipulation and hygiene: with supervision;with adaptive equipment;sit to/from stand Pt Will Perform Tub/Shower Transfer: Tub transfer;with min guard assist;tub bench;ambulating  OT Frequency: Min 2X/week   Barriers to D/C:            Co-evaluation              AM-PAC OT "6 Clicks" Daily Activity     Outcome Measure Help from another person eating meals?: None Help from another person taking care of personal grooming?: None Help from another person toileting, which includes using toliet, bedpan, or urinal?: A Lot Help from another person bathing (including washing, rinsing, drying)?: A Lot Help from another person to put on and taking off regular upper body clothing?: A Little Help from another person to put on and taking off regular lower body clothing?: A Lot 6 Click Score: 17   End of  Session Equipment Utilized During Treatment: Gait belt;Back brace  Activity Tolerance: Patient limited by pain Patient left: in chair;with chair alarm set;with call bell/phone within reach  OT Visit Diagnosis: Pain Pain - part of body: (back)                Time: 8676-1950 OT Time Calculation (min): 41 min Charges:  OT General Charges $OT Visit: 1 Visit OT Treatments $Self Care/Home Management : 38-52 mins  Joeseph Amor OTR/L  Acute Rehab Services  [phone] office number [phone] pager number   Joeseph Amor 05/25/2018, 9:59 AM

## 2018-05-25 NOTE — Progress Notes (Signed)
Patient ID: [name], female   DOB: 1965/12/27, 54 y.o.   MRN: [id number] Vital signs are stable Toradol does not seem to be providing her much relief Motor function appears stable Blood sugar today is 141 We will add low-dose Decadron for a few days However suggested we get her on oral pain medications so that we can ultimately get her discharged home

## 2018-05-26 LAB — GLUCOSE, CAPILLARY: Glucose-Capillary: 125 mg/dL — ABNORMAL HIGH (ref 70–99)

## 2018-05-26 MED ORDER — METHOCARBAMOL 500 MG PO TABS: 500.0000 mg | ORAL_TABLET | Freq: Four times a day (QID) | ORAL | 3 refills | Status: DC | PRN

## 2018-05-26 MED ORDER — OXYCODONE-ACETAMINOPHEN 10-325 MG PO TABS: 1.0000 | ORAL_TABLET | ORAL | 0 refills | Status: DC | PRN

## 2018-05-26 NOTE — Progress Notes (Signed)
DME delivered. Left UE midline IV removed without issue. Reviewed discharge instructions and medications. Expressed verbal understanding. Medicated for pain during last OT walk. Husband driving her home. Neuro status unchanged. Simmie Davies RN

## 2018-05-26 NOTE — TOC Transition Note (Signed)
Transition of Care [facility]) - CM/SW Discharge Note   Patient Details  Name: [name] MRN: [id number] Date of Birth: 1966/02/26  [facility] CM/SW Contact:  Claudie Leach, RN Phone Number: 05/26/2018, 3:18 PM   Clinical Narrative:   Pt to d/c home with spouse.     Final next level of care: Home w Home Health Services Barriers to Discharge: No Barriers Identified   Patient Goals and CMS Choice   CMS Medicare.gov Compare Post Acute Care list provided to:: Patient Choice offered to / list presented to : Patient     Discharge Plan and Services   Discharge Planning Services: CM Consult Post Acute Care Choice: Durable Medical Equipment, Home Health          DME Arranged: 3-N-1, Walker rolling DME Agency: [facility] HH Arranged: PT, OT HH Agency: [facility] ([place]   Readmission Risk Interventions No flowsheet data found.

## 2018-05-26 NOTE — Progress Notes (Signed)
Occupational Therapy Treatment Patient Details Name: [name] MRN: [id number] DOB: Apr 04, 1965 Today's Date: 05/26/2018    History of present illness 53 year old individual whose had significant surgery in her lumbar spine in the past.  She has previously undergone decompression fusion at L5-S1 for centrally herniated disc and a decompression and fusion at L3-4 for separate process. January demonstrated that the patient had a broad-based bulge of the disc at L4-L5 with significant facet hypertrophy and rather severe lateral recess stenosis. s/p Lumbar 4-5 Posterior lumbar interbody fusion 05/23/18    OT comments  Pt. Progressing well with skilled OT.  Able to complete bed mobility mod I. Tub and shower transfers with min guard a.  Reports husband available in the evenings after work to assist as needed will be alone during the days.  Continue with LB ADLs at next session.    Follow Up Recommendations  Home health OT;Supervision - Intermittent    Equipment Recommendations  3 in 1 bedside commode;Tub/shower bench    Recommendations for Other Services      Precautions / Restrictions Precautions Precautions: Back Precaution Booklet Issued: Yes (comment) Precaution Comments: reviewed precautions, able to recall 3/3 without cues Required Braces or Orthoses: Spinal Brace Spinal Brace: Lumbar corset;Applied in sitting position(she states she prefers to don hers in standing , states she has not been told she can not)       Mobility Bed Mobility Overal bed mobility: Modified Independent Bed Mobility: Rolling;Sidelying to Sit Rolling: Modified independent (Device/Increase time) Sidelying to sit: Modified independent (Device/Increase time)       General bed mobility comments: hob flat, exits on L side, no rails. excellent tech. of log roll and able to maintain precautions throughout  Transfers Overall transfer level: Needs assistance Equipment used: Rolling walker (2 wheeled) Transfers:  Sit to/from Stand Sit to Stand: Supervision Stand pivot transfers: Supervision            Balance                                           ADL either performed or assessed with clinical judgement   ADL Overall ADL's : Needs assistance/impaired                 Upper Body Dressing : Standing;Supervision/safety Upper Body Dressing Details (indicate cue type and reason): don brace, reviewed notes indicate don in sitting, she states she has always done it in standing and cont. to do so during this session also             Tub/ Shower Transfer: Walk-in shower;Tub transfer;Min guard;Cueing for sequencing;Rolling walker Tub/Shower Transfer Details (indicate cue type and reason): pt. states she uses a walk in shower and a tub/shower and uses both.  pt. able to return demo on both a shower stall and tub transfer for L faucet with not noted difficulty.  educated on having a 2nd person to help stabalize the walker during in/out  Functional mobility during ADLs: Min Youth worker     Praxis      Cognition Arousal/Alertness: Awake/alert Behavior During Therapy: WFL for tasks assessed/performed Overall Cognitive Status: Within Functional Limits for tasks assessed  Exercises     Shoulder Instructions       General Comments      Pertinent Vitals/ Pain       Pain Assessment: 0-10 Pain Score: 6  Pain Location: back Pain Descriptors / Indicators: Aching Pain Intervention(s): Limited activity within patient's tolerance;Monitored during session;RN gave pain meds during session;Repositioned  Home Living                                          Prior Functioning/Environment              Frequency  Min 2X/week        Progress Toward Goals  OT Goals(current goals can now be found in the care plan section)  Progress towards OT  goals: Progressing toward goals     Plan Discharge plan remains appropriate    Co-evaluation                 AM-PAC OT "6 Clicks" Daily Activity     Outcome Measure   Help from another person eating meals?: None Help from another person taking care of personal grooming?: None Help from another person toileting, which includes using toliet, bedpan, or urinal?: A Lot Help from another person bathing (including washing, rinsing, drying)?: A Lot Help from another person to put on and taking off regular upper body clothing?: A Little Help from another person to put on and taking off regular lower body clothing?: A Lot 6 Click Score: 17    End of Session Equipment Utilized During Treatment: Gait belt;Back brace  OT Visit Diagnosis: Pain   Activity Tolerance Patient tolerated treatment well   Patient Left in chair;with call bell/phone within reach   Nurse Communication Patient requests pain meds        Time: 9323-5573 OT Time Calculation (min): 23 min  Charges: OT General Charges $OT Visit: 1 Visit OT Treatments $Self Care/Home Management : 23-37 mins  Janice Coffin, COTA/L 05/26/2018, 10:52 AM

## 2018-05-26 NOTE — Progress Notes (Signed)
Husband in possession of DME. All belonging accounted for again. Patient has not further questions about AVS, declined any immunizations, has contact information for return appointments. Will call Dr. Clarice Pole office tomorrow to make 2 week return appointment. Simmie Davies RN

## 2018-05-26 NOTE — Discharge Summary (Signed)
Physician Discharge Summary  Patient ID: [name] MRN: [id number] DOB/AGE: [birth year] 53 y.o.  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  History of fusion L3-4 and L5-S1.  Prediabetes.  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  History of fusion L3-4 and L5-S1.  Prediabetes.  Acute blood loss anemia. Active Problems:   Lumbar stenosis with neurogenic claudication   Discharged Condition: good  Hospital Course: Patient was admitted to undergo surgical decompression at L4-L5.  She had previous surgery at L3-4 and L5-S1 with a fusion at those levels.  She tolerated surgery well.  Postoperatively she had greater than 3 g hemoglobin loss consistent with acute blood loss anemia.  She did not require transfusion.  Consults: None  Significant Diagnostic Studies: None  Treatments: surgery: Decompression and fusion L4-L5  Discharge Exam: Blood pressure (!) 144/80, pulse 94, temperature 98.2 F (36.8 C), temperature source Oral, resp. rate 20, height 5' 6.5" (1.689 m), weight 103.4 kg, SpO2 97 %. Incision is clean and dry motor function is intact in the lower extremities.  Station and gait is intact.  Disposition: Discharge disposition: 01-Home or Self Care       Discharge Instructions    Call MD for:  redness, tenderness, or signs of infection (pain, swelling, redness, odor or green/yellow discharge around incision site)   Complete by:  As directed    Call MD for:  severe uncontrolled pain   Complete by:  As directed    Call MD for:  temperature >100.4   Complete by:  As directed    Diet - low sodium heart healthy   Complete by:  As directed    Discharge instructions   Complete by:  As directed    Okay to shower. Do not apply salves or appointments to incision. No heavy lifting with the upper extremities greater than 15 pounds. May resume driving when not  requiring pain medication and patient feels comfortable with doing so.   Incentive spirometry RT   Complete by:  As directed    Increase activity slowly   Complete by:  As directed      Allergies as of 05/26/2018      Reactions   Penicillins Hives   Has patient had a PCN reaction causing immediate rash, facial/tongue/throat swelling, SOB or lightheadedness with hypotension: Yes Has patient had a PCN reaction causing severe rash involving mucus membranes or skin necrosis: No Has patient had a PCN reaction that required hospitalization No Has patient had a PCN reaction occurring within the last 10 years: No If all of the above answers are "NO", then may proceed with Cephalosporin use.   Toradol [ketorolac Tromethamine] Hives   Lamotrigine Rash   Zofran [ondansetron Hcl] Itching      Medication List    TAKE these medications   Aimovig 140 MG/ML Soaj Generic drug:  Erenumab-aooe Inject 140 mg into the skin every 28 (twenty-eight) days.   amLODipine 5 MG tablet Commonly known as:  NORVASC Take 1 tablet (5 mg total) by mouth daily.   B-12 PO Take 1 tablet by mouth daily as needed (fatigue).   cyclobenzaprine 10 MG tablet Commonly known as:  FLEXERIL TAKE 1 TABLET BY MOUTH THREE TIMES A DAY AS NEEDED FOR MUSCLE SPASMS What changed:  See the new instructions.   diazepam 2 MG tablet Commonly known as:  VALIUM Take 2 mg by mouth every 8 (eight) hours as needed for anxiety.  DULoxetine 60 MG capsule Commonly known as:  CYMBALTA Take 120 mg by mouth daily.   hydrochlorothiazide 25 MG tablet Commonly known as:  HYDRODIURIL Take 1 tablet (25 mg total) by mouth daily.   HYDROcodone-acetaminophen 5-325 MG tablet Commonly known as:  Norco Take 1 tablet by mouth every 6 (six) hours as needed for moderate pain.   hydrOXYzine 50 MG tablet Commonly known as:  ATARAX/VISTARIL Take 1 tablet (50 mg total) by mouth every 4 (four) hours as needed for itching.   methocarbamol 500 MG  tablet Commonly known as:  ROBAXIN Take 1 tablet (500 mg total) by mouth every 6 (six) hours as needed for muscle spasms.   mirtazapine 15 MG tablet Commonly known as:  REMERON Take 15 mg by mouth at bedtime.   oxyCODONE-acetaminophen 10-325 MG tablet Commonly known as:  Percocet Take 1-2 tablets by mouth every 4 (four) hours as needed for pain.   predniSONE 20 MG tablet Commonly known as:  DELTASONE 3 tabs poqday 1-2, 2 tabs poqday 3-4, 1 tab poqday 5-6   promethazine 25 MG suppository Commonly known as:  PHENERGAN Place 1 suppository (25 mg total) rectally every 6 (six) hours as needed for nausea or vomiting.   QUEtiapine 25 MG tablet Commonly known as:  SEROQUEL Take 25 mg by mouth at bedtime.            Durable Medical Equipment  (From admission, onward)         Start     Ordered   05/26/18 1349  For home use only DME Walker rolling  [facility])  Once    Question:  Patient needs a walker to treat with the following condition  Answer:  Lumbar stenosis with neurogenic claudication   05/26/18 1349   05/26/18 1349  DME 3-in-1  Once     05/26/18 1349           Signed: Blanchie Dessert Kattia Selley 05/26/2018, 1:49 PM

## 2018-05-26 NOTE — Progress Notes (Signed)
Physical Therapy Treatment Patient Details Name: [name] MRN: [id number] DOB: 07/10/1965 Today's Date: 05/26/2018    History of Present Illness 53 year old individual whose had significant surgery in her lumbar spine in the past.  She has previously undergone decompression fusion at L5-S1 for centrally herniated disc and a decompression and fusion at L3-4 for separate process. January demonstrated that the patient had a broad-based bulge of the disc at L4-L5 with significant facet hypertrophy and rather severe lateral recess stenosis. s/p Lumbar 4-5 Posterior lumbar interbody fusion 05/23/18     PT Comments    Continuing work on functional mobility and activity tolerance;  Managing walking household distances in brace with RW well; cues for log roll, and sit to stand; Questions solicited and answered ; OK for dc home from PT standpoint   Follow Up Recommendations  Home health PT;Supervision/Assistance - 24 hour     Equipment Recommendations  Rolling walker with 5" wheels;3in1 (PT)    Recommendations for Other Services       Precautions / Restrictions Precautions Precautions: Back Precaution Booklet Issued: Yes (comment) Precaution Comments: reviewed precautions, able to recall 3/3 without cues Required Braces or Orthoses: Spinal Brace Spinal Brace: Lumbar corset;Applied in sitting position    Mobility  Bed Mobility Overal bed mobility: Needs Assistance Bed Mobility: Rolling;Sidelying to Sit;Sit to Sidelying Rolling: Supervision Sidelying to sit: Supervision     Sit to sidelying: Supervision General bed mobility comments: hob flat, exits on L side, no rails. excellent tech. of log roll and able to maintain precautions throughout; cues for technqiue and monitor for prec  Transfers Overall transfer level: Needs assistance Equipment used: Rolling walker (2 wheeled) Transfers: Sit to/from Stand Sit to Stand: Min assist         General transfer comment: for safety; cues  for safe hand placement; pulled up on RW despite cues, minguard to steady RW  Ambulation/Gait Ambulation/Gait assistance: Min guard Gait Distance (Feet): 150 Feet Assistive device: Rolling walker (2 wheeled) Gait Pattern/deviations: Step-through pattern;Decreased stride length     General Gait Details: decreased cadence but steady gait and pt with good upright posture   Stairs             Wheelchair Mobility    Modified Rankin (Stroke Patients Only)       Balance Overall balance assessment: Mild deficits observed, not formally tested                                          Cognition Arousal/Alertness: Awake/alert Behavior During Therapy: WFL for tasks assessed/performed Overall Cognitive Status: Within Functional Limits for tasks assessed                                 General Comments: 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       Exercises      General Comments        Pertinent Vitals/Pain Pain Assessment: 0-10 Pain Score: 7  Pain Location: back Pain Descriptors / Indicators: Aching Pain Intervention(s): RN gave pain meds during session    Home Living Family/patient expects to be discharged to:: Private residence Living Arrangements: Spouse/significant other                  Prior Function  PT Goals (current goals can now be found in the care plan section) Acute Rehab PT Goals Patient Stated Goal: to feel better  PT Goal Formulation: With patient Time For Goal Achievement: 06/07/18 Potential to Achieve Goals: Good Progress towards PT goals: Progressing toward goals    Frequency    Min 5X/week      PT Plan Current plan remains appropriate    Co-evaluation              AM-PAC PT "6 Clicks" Mobility   Outcome Measure  Help needed turning from your back to your side while in a flat bed without using bedrails?: None Help needed moving from lying on your back to sitting on  the side of a flat bed without using bedrails?: None Help needed moving to and from a bed to a chair (including a wheelchair)?: None Help needed standing up from a chair using your arms (e.g., wheelchair or bedside chair)?: A Little Help needed to walk in hospital room?: None Help needed climbing 3-5 steps with a railing? : A Little 6 Click Score: 22    End of Session Equipment Utilized During Treatment: Back brace Activity Tolerance: Patient tolerated treatment well Patient left: in bed;with call bell/phone within reach Nurse Communication: Mobility status PT Visit Diagnosis: Other abnormalities of gait and mobility (R26.89);Muscle weakness (generalized) (M62.81);Difficulty in walking, not elsewhere classified (R26.2);Pain;Other symptoms and signs involving the nervous system (R29.898) Pain - part of body: (Back)     Time: 9323-5573 PT Time Calculation (min) (ACUTE ONLY): 23 min  Charges:  $Gait Training: 23-37 mins                     Roney Marion, [place]  [place] Pager [phone] Office [place] 05/26/2018, 3:49 PM

## 2018-05-26 NOTE — Discharge Instructions (Addendum)
DISCHARGE INSTRUCTIONS   Call MD for: redness, tenderness, or signs of infection (pain, swelling, redness, odor or green/yellow discharge around incision site)   Call MD for: severe uncontrolled pain   Call MD for: temperature >100.4   Diet - low sodium heart healthy   Discharge instructions   Okay to shower. Do not apply salves or appointments to incision. No heavy lifting with the upper extremities greater than 15 pounds. May resume driving when not requiring pain medication and patient feels comfortable with doing so.  Incentive spirometry RT   Increase activity slowly    Spondylolysis  Spondylolysis is a small break or crack (stress fracture) in a bone in the spine (vertebra) in the lower back (lumbar spine). The stress fracture occurs on the bony mass between and behind the vertebra. Spondylolysis may be caused by an injury (trauma) or by overuse. Since the lower back is almost always under pressure from daily living, this stress fracture usually does not heal normally. Spondylolysis may eventually cause one vertebra to slip forward and out of place (spondylolisthesis). What are the causes? This condition may be caused by:  Trauma, such as a fall.  Excessive wear and tear. This is often a result of doing sports or physical activities that involve repetitive overstretching (hyperextension) and rotation of the spine. What increases the risk?  You are more likely to develop this condition if you have: ? A family history of this condition. ? An inward curvature of your spine (lordosis). ? A condition that affects your spine, such as spina bifida. You are more likely to develop this condition if you participate in:  Gymnastics.  Dance.  Football.  Wrestling.  Martial arts.  Weight lifting.  Tennis.  Swimming. What are the signs or symptoms? Symptoms of this condition may include:  Long-lasting (chronic) pain in the lower back.  Stiffness in the back or  legs.  Tightness in the hamstring muscles, which are in the backs of the thighs. In some cases, there may be no symptoms of this condition. How is this diagnosed? This condition may be diagnosed based on:  Your symptoms.  Your medical history.  A physical exam.  Imaging tests, such as: ? X-rays. ? CT scan. ? MRI. How is this treated? This condition may be treated by:  Resting. You may be asked to avoid or modify activities that put strain on your back until your symptoms improve.  Medicines to help relieve pain.  NSAIDs to help reduce swelling and discomfort.  Injections of medicine (cortisone) in your back. These injections can help to relieve pain and numbness.  A brace to stabilize and support your back.  Physical therapy. You may work with an occupational therapist or physical therapist who can teach you how to reduce pressure on your back while you do everyday activities.  Surgery. This may be needed if you have: ? A severe injury. ? Pain that lasts for more than 6 months. ? Numbness in your pelvic region. ? Changes in control of your stool or urine. Follow these instructions at home: Medicines  Take over-the-counter and prescription medicines only as told by your health care provider.  Ask your health care provider if the medicine prescribed to you: ? Requires you to avoid driving or using heavy machinery. ? Can cause constipation. You may need to take these actions to prevent or treat constipation:  Drink enough fluid to keep your urine pale yellow.  Take over-the-counter or prescription medicines.  Eat foods that are  high in fiber, such as beans, whole grains, and fresh fruits and vegetables.  Limit foods that are high in fat and processed sugars, such as fried or sweet foods. If you have a brace:  Wear the brace as told by your health care provider. Remove it only as told by your health care provider.  Keep the brace clean.  If the brace is not  waterproof: ? Do not let it get wet. ? Cover it with a watertight covering when you take a bath or a shower. Activity  Rest and return to your normal activities as told by your health care provider. Ask your health care provider what activities are safe for you.  Ask your health care provider when it is safe to drive if you have a back brace.  Work with a physical therapist to make a safe exercise program, as recommended by your health care provider. Do exercises as told by your physical therapist. This may include exercises to strengthen your back and abdominal muscles (core exercises). Managing pain, stiffness, and swelling      If directed, put ice on the affected area. ? If you have a removable brace, remove it as told by your health care provider. ? Put ice in a plastic bag. ? Place a towel between your skin and the bag. ? Leave the ice on for 20 minutes, 2-3 times a day.  If directed, apply heat to the affected area as often as told by your health care provider. Use the heat source that your health care provider recommends, such as a moist heat pack or a heating pad. ? If you have a removable brace, remove it as told by your health care provider. ? Place a towel between your skin and the heat source. ? Leave the heat on for 20-30 minutes. ? Remove the heat if your skin turns bright red. This is especially important if you are unable to feel pain, heat, or cold. You may have a greater risk of getting burned. General instructions  Do not use any products that contain nicotine or tobacco, such as cigarettes, e-cigarettes, and chewing tobacco. These can delay bone healing. If you need help quitting, ask your health care provider.  Maintain a healthy weight. Extra weight puts stress on your back.  Keep all follow-up visits as told by your health care provider. This is important. Contact a health care provider if:  You have pain that gets worse or does not get better. Get help  right away if:  You have severe back pain.  You have changes in control of your stool or urine.  You develop weakness or numbness in your legs.  You are unable to stand or walk. Summary  Spondylolysis is a small break or crack (stress fracture) in a bone in the spine (vertebra) in the lower back (lumbar spine).  This condition may be treated by resting, medicines, physical therapy, wearing a brace, or surgery.  Rest and return to your normal activities as told by your health care provider. Ask your health care provider what activities are safe for you.  Contact a health care provider if you have pain that gets worse or does not get better. This information is not intended to replace advice given to you by your health care provider. Make sure you discuss any questions you have with your health care provider. Document Released: 02/20/2005 Document Revised: 09/25/2017 Document Reviewed: 09/25/2017 Elsevier Interactive Patient Education  2019 [employer].   Spondylolysis Rehab Ask  your health care provider which exercises are safe for you. Do exercises exactly as told by your health care provider and adjust them as directed. It is normal to feel mild stretching, pulling, tightness, or discomfort as you do these exercises, but you should stop right away if you feel sudden pain or your pain gets worse. Do not begin these exercises until told by your health care provider. Stretching and range of motion exercises These exercises warm up your muscles and joints and improve the movement and flexibility of your hips and your back. These exercises may also help to relieve pain, numbness, and tingling. Exercise A: Single knee to chest  1. Lie on your back on a firm surface with both legs straight. 2. Bend one of your knees. Use your hands to move your knee up toward your chest until you feel a gentle stretch in your lower back and buttock. ? Hold your leg in this position by holding onto the  front of your knee. ? Keep your other leg as straight as possible. 3. Hold for __________ seconds. 4. Slowly return to the starting position. 5. Repeat this exercise with your other leg. Repeat __________ times. Complete this exercise __________ times a day. Exercise B: Hamstring stretch, supine  1. Lie on your back. 2. Hold both ends of a belt or towel as you loop it over the ball of one of your feet. The ball of your foot is on the walking surface, right under your toes. 3. Straighten your knee and slowly pull on the belt to raise your leg. ? Do not let your knee bend while you do this. ? Keep your other leg flat on the floor. ? Raise the leg until you feel a gentle stretch in the back of your knee or thigh. 4. Hold for __________ seconds. 5. If told by your health care provider, repeat this exercise with your other leg. Repeat __________ times. Complete this exercise __________ times a day. Strengthening exercises These exercises build strength and endurance in your back. Endurance is the ability to use your muscles for a long time, even after they get tired. Exercise C: Pelvic tilt 1. Lie on your back on a firm bed or the floor. Bend your knees and keep your feet flat. 2. Tense your abdominal muscles. Tip your pelvis up toward the ceiling and flatten your lower back into the floor. ? To help with this exercise, you may place a small towel under your lower back and try to push your back into the towel. 3. Hold for __________ seconds. 4. Let your muscles relax completely before you repeat this exercise. Repeat __________ times. Complete this exercise __________ times a day. Exercise D: Abdominal crunch  1. Lie on your back on a firm surface. Bend your knees and keep your feet flat. Cross your arms over your chest. 2. Tuck your chin down toward your chest, without bending your neck. 3. Use your abdominal muscles to lift your upper body off of the ground, straight up into the  air. ? Try to lift yourself until your shoulder blades are off the ground. You may need to work up to this. ? Keep your lower back on the ground while you crunch upward. ? Do not hold your breath. 4. Slowly lower yourself down. Keep your abdominal muscles tense until you are back to the starting position. Repeat __________ times. Complete this exercise __________ times a day. Exercise E: Alternating arm and leg raises  1. Get on your  hands and knees on a firm surface. If you are on a hard floor, you may want to use padding to cushion your knees, such as an exercise mat. 2. Line up your arms and legs. Your hands should be below your shoulders, and your knees should be below your hips. 3. Lift your left leg behind you. At the same time, raise your right arm and straighten it in front of you. ? Do not lift your leg higher than your hip. ? Do not lift your arm higher than your shoulder. ? Keep your abdominal and back muscles tight. ? Keep your hips facing the ground. ? Do not arch your back. ? Keep your balance carefully, and do not hold your breath. 4. Hold for __________ seconds. 5. Slowly return to the starting position and repeat with your right leg and your left arm. Repeat __________ times. Complete this exercise __________ times a day. Posture and body mechanics Body mechanics refers to the movements and positions of your body while you do your daily activities. Posture is part of body mechanics. Good posture and healthy body mechanics can help to relieve stress in your body's tissues and joints. Good posture means that your spine is in its natural S-curve position (your spine is neutral), your shoulders are pulled back slightly, and your head is not tipped forward. The following are general guidelines for applying improved posture and body mechanics to your everyday activities. Standing   When standing, keep your spine neutral and your feet about hip-width apart. Keep a slight bend in  your knees. Your ears, shoulders, and hips should line up with each other.  When you do a task in which you stand in one place for a long time, place one foot up on a stable object that is 2-4 inches (5-10 cm) high, such as a footstool. This helps keep your spine neutral. Sitting   When sitting, keep your spine neutral and keep your feet flat on the floor. Use a footrest, if necessary, and keep your thighs parallel to the floor. Avoid rounding your shoulders, and avoid tilting your head forward.  When working at a desk or a computer, keep your desk at a height where your hands are slightly lower than your elbows. Slide your chair under your desk so you are close enough to maintain good posture.  When working at a computer, place your monitor at a height where you are looking straight ahead and you do not have to tilt your head forward or downward to look at the screen. Resting When lying down and resting, avoid positions that are most painful for you.  If you have pain with activities such as sitting, bending, stooping, or squatting (flexion-based activities), lie in a position in which your body does not bend very much. For example, avoid curling up on your side with your arms and knees near your chest (fetal position).  If you have pain with activities such as standing for a long time or reaching with your arms (extension-based activities), lie with your spine in a neutral position and bend your knees slightly. Try the following positions: ? Lying on your side with a pillow between your knees. ? Lying on your back with a pillow under your knees.  Lifting   When lifting objects, keep your feet at least shoulder-width apart and tighten your abdominal muscles.  Bend your knees and hips and keep your spine neutral. It is important to lift using the strength of your legs, not your  back. Do not lock your knees straight out.  Always ask for help to lift heavy or awkward objects. This  information is not intended to replace advice given to you by your health care provider. Make sure you discuss any questions you have with your health care provider. Document Released: 02/20/2005 Document Revised: 10/28/2015 Document Reviewed: 12/01/2014 Elsevier Interactive Patient Education  2019 [employer]. Provided per MD orders and on the AVS, reviewed with patient. Simmie Davies RN

## 2018-05-27 DIAGNOSIS — M48062 Spinal stenosis, lumbar region with neurogenic claudication: Secondary | ICD-10-CM | POA: Diagnosis not present

## 2018-05-27 DIAGNOSIS — R7303 Prediabetes: Secondary | ICD-10-CM | POA: Diagnosis not present

## 2018-05-27 DIAGNOSIS — G4730 Sleep apnea, unspecified: Secondary | ICD-10-CM | POA: Diagnosis not present

## 2018-05-27 DIAGNOSIS — M4326 Fusion of spine, lumbar region: Secondary | ICD-10-CM | POA: Diagnosis not present

## 2018-05-27 DIAGNOSIS — I10 Essential (primary) hypertension: Secondary | ICD-10-CM | POA: Diagnosis not present

## 2018-05-27 DIAGNOSIS — D62 Acute posthemorrhagic anemia: Secondary | ICD-10-CM | POA: Diagnosis not present

## 2018-05-27 DIAGNOSIS — F329 Major depressive disorder, single episode, unspecified: Secondary | ICD-10-CM | POA: Diagnosis not present

## 2018-05-27 DIAGNOSIS — Z981 Arthrodesis status: Secondary | ICD-10-CM | POA: Diagnosis not present

## 2018-05-27 DIAGNOSIS — G4089 Other seizures: Secondary | ICD-10-CM | POA: Diagnosis not present

## 2018-05-27 DIAGNOSIS — F419 Anxiety disorder, unspecified: Secondary | ICD-10-CM | POA: Diagnosis not present

## 2018-05-27 DIAGNOSIS — F4310 Post-traumatic stress disorder, unspecified: Secondary | ICD-10-CM | POA: Diagnosis not present

## 2018-05-27 DIAGNOSIS — Z4789 Encounter for other orthopedic aftercare: Secondary | ICD-10-CM | POA: Diagnosis not present

## 2018-05-29 ENCOUNTER — Ambulatory Visit (INDEPENDENT_AMBULATORY_CARE_PROVIDER_SITE_OTHER): Payer: BLUE CROSS/BLUE SHIELD | Admitting: Family Medicine

## 2018-05-29 ENCOUNTER — Encounter: Payer: Self-pay | Admitting: Family Medicine

## 2018-05-29 ENCOUNTER — Other Ambulatory Visit: Payer: Self-pay

## 2018-05-29 DIAGNOSIS — H00011 Hordeolum externum right upper eyelid: Secondary | ICD-10-CM | POA: Diagnosis not present

## 2018-05-29 MED ORDER — ERYTHROMYCIN 5 MG/GM OP OINT: TOPICAL_OINTMENT | OPHTHALMIC | 0 refills | Status: DC

## 2018-05-29 NOTE — Progress Notes (Signed)
Virtual Visit via Telephone Note  I connected with [name] on 05/29/18 at 12:34 by telephone and verified that I am speaking with the correct person using two identifiers.    Patient location: At home  Physician Location: Vic Blackbird MD, [facility]    I discussed the limitations, risks, security and privacy concerns of performing an evaluation and management service by telephone and the availability of in person appointments. I also discussed with the patient that there may be a patient responsible charge related to this service. The patient expressed understanding and agreed to proceed.   History of Present Illness:    She feels a stye right eye on top lid.  States that she does not actually see anything.  She noticed it today, tenderness , no drainge, no crusting,  Vision is okay.  He has had styes in the past.  No fever, cough or congestion. Had sinus drainage before the surgery    Had back surgery on Thursday able to come in for a visit  Medications reviewed- now on percocet and robaxin from surgeon    Observations/Objective: Unable to visualize eyes this is a telephone visit  Assessment and Plan: Stye-patient likely has early hordeolum she actually cannot visualize it herself but feels it.  There is no change in vision is no drainage currently.  Advised her to use warm compresses throughout the day to see if this resolves it or it may at least come to ahead.  If she gets redness or any drainage she is to use erythromycin ointment which I have placed on file at the pharmacy but at this time not to use any ointment as there is nothing on the surface.  Follow Up Instructions:    I discussed the assessment and treatment plan with the patient. The patient was provided an opportunity to ask questions and all were answered. The patient agreed with the plan and demonstrated an understanding of the instructions.   The patient was advised to call back or seek an  in-person evaluation if the symptoms worsen or if the condition fails to improve as anticipated.  I provided 6 minutes of non-face-to-face time during this encounter. End Time 12:40   Vic Blackbird, MD

## 2018-05-30 DIAGNOSIS — Z4789 Encounter for other orthopedic aftercare: Secondary | ICD-10-CM | POA: Diagnosis not present

## 2018-05-30 DIAGNOSIS — F329 Major depressive disorder, single episode, unspecified: Secondary | ICD-10-CM | POA: Diagnosis not present

## 2018-05-30 DIAGNOSIS — D62 Acute posthemorrhagic anemia: Secondary | ICD-10-CM | POA: Diagnosis not present

## 2018-05-30 DIAGNOSIS — F4310 Post-traumatic stress disorder, unspecified: Secondary | ICD-10-CM | POA: Diagnosis not present

## 2018-05-30 DIAGNOSIS — F419 Anxiety disorder, unspecified: Secondary | ICD-10-CM | POA: Diagnosis not present

## 2018-05-30 DIAGNOSIS — Z981 Arthrodesis status: Secondary | ICD-10-CM | POA: Diagnosis not present

## 2018-05-30 DIAGNOSIS — M4326 Fusion of spine, lumbar region: Secondary | ICD-10-CM | POA: Diagnosis not present

## 2018-05-30 DIAGNOSIS — G4089 Other seizures: Secondary | ICD-10-CM | POA: Diagnosis not present

## 2018-05-30 DIAGNOSIS — M48062 Spinal stenosis, lumbar region with neurogenic claudication: Secondary | ICD-10-CM | POA: Diagnosis not present

## 2018-05-30 DIAGNOSIS — G4730 Sleep apnea, unspecified: Secondary | ICD-10-CM | POA: Diagnosis not present

## 2018-05-30 DIAGNOSIS — R7303 Prediabetes: Secondary | ICD-10-CM | POA: Diagnosis not present

## 2018-05-30 DIAGNOSIS — R419 Unspecified symptoms and signs involving cognitive functions and awareness: Secondary | ICD-10-CM | POA: Diagnosis not present

## 2018-05-30 DIAGNOSIS — I10 Essential (primary) hypertension: Secondary | ICD-10-CM | POA: Diagnosis not present

## 2018-06-03 DIAGNOSIS — G4730 Sleep apnea, unspecified: Secondary | ICD-10-CM | POA: Diagnosis not present

## 2018-06-03 DIAGNOSIS — R7303 Prediabetes: Secondary | ICD-10-CM | POA: Diagnosis not present

## 2018-06-03 DIAGNOSIS — I10 Essential (primary) hypertension: Secondary | ICD-10-CM | POA: Diagnosis not present

## 2018-06-03 DIAGNOSIS — G4089 Other seizures: Secondary | ICD-10-CM | POA: Diagnosis not present

## 2018-06-03 DIAGNOSIS — M48062 Spinal stenosis, lumbar region with neurogenic claudication: Secondary | ICD-10-CM | POA: Diagnosis not present

## 2018-06-03 DIAGNOSIS — Z4789 Encounter for other orthopedic aftercare: Secondary | ICD-10-CM | POA: Diagnosis not present

## 2018-06-03 DIAGNOSIS — F4310 Post-traumatic stress disorder, unspecified: Secondary | ICD-10-CM | POA: Diagnosis not present

## 2018-06-03 DIAGNOSIS — D62 Acute posthemorrhagic anemia: Secondary | ICD-10-CM | POA: Diagnosis not present

## 2018-06-03 DIAGNOSIS — Z981 Arthrodesis status: Secondary | ICD-10-CM | POA: Diagnosis not present

## 2018-06-03 DIAGNOSIS — F329 Major depressive disorder, single episode, unspecified: Secondary | ICD-10-CM | POA: Diagnosis not present

## 2018-06-03 DIAGNOSIS — M4326 Fusion of spine, lumbar region: Secondary | ICD-10-CM | POA: Diagnosis not present

## 2018-06-03 DIAGNOSIS — F419 Anxiety disorder, unspecified: Secondary | ICD-10-CM | POA: Diagnosis not present

## 2018-06-05 DIAGNOSIS — R7303 Prediabetes: Secondary | ICD-10-CM | POA: Diagnosis not present

## 2018-06-05 DIAGNOSIS — G4089 Other seizures: Secondary | ICD-10-CM | POA: Diagnosis not present

## 2018-06-05 DIAGNOSIS — G4730 Sleep apnea, unspecified: Secondary | ICD-10-CM | POA: Diagnosis not present

## 2018-06-05 DIAGNOSIS — F329 Major depressive disorder, single episode, unspecified: Secondary | ICD-10-CM | POA: Diagnosis not present

## 2018-06-05 DIAGNOSIS — Z4789 Encounter for other orthopedic aftercare: Secondary | ICD-10-CM | POA: Diagnosis not present

## 2018-06-05 DIAGNOSIS — M48062 Spinal stenosis, lumbar region with neurogenic claudication: Secondary | ICD-10-CM | POA: Diagnosis not present

## 2018-06-05 DIAGNOSIS — F419 Anxiety disorder, unspecified: Secondary | ICD-10-CM | POA: Diagnosis not present

## 2018-06-05 DIAGNOSIS — D62 Acute posthemorrhagic anemia: Secondary | ICD-10-CM | POA: Diagnosis not present

## 2018-06-05 DIAGNOSIS — I10 Essential (primary) hypertension: Secondary | ICD-10-CM | POA: Diagnosis not present

## 2018-06-05 DIAGNOSIS — M4326 Fusion of spine, lumbar region: Secondary | ICD-10-CM | POA: Diagnosis not present

## 2018-06-05 DIAGNOSIS — Z981 Arthrodesis status: Secondary | ICD-10-CM | POA: Diagnosis not present

## 2018-06-05 DIAGNOSIS — F4310 Post-traumatic stress disorder, unspecified: Secondary | ICD-10-CM | POA: Diagnosis not present

## 2018-06-06 DIAGNOSIS — F4310 Post-traumatic stress disorder, unspecified: Secondary | ICD-10-CM | POA: Diagnosis not present

## 2018-06-06 DIAGNOSIS — G4730 Sleep apnea, unspecified: Secondary | ICD-10-CM | POA: Diagnosis not present

## 2018-06-06 DIAGNOSIS — M4326 Fusion of spine, lumbar region: Secondary | ICD-10-CM | POA: Diagnosis not present

## 2018-06-06 DIAGNOSIS — D62 Acute posthemorrhagic anemia: Secondary | ICD-10-CM | POA: Diagnosis not present

## 2018-06-06 DIAGNOSIS — G4089 Other seizures: Secondary | ICD-10-CM | POA: Diagnosis not present

## 2018-06-06 DIAGNOSIS — Z981 Arthrodesis status: Secondary | ICD-10-CM | POA: Diagnosis not present

## 2018-06-06 DIAGNOSIS — Z4789 Encounter for other orthopedic aftercare: Secondary | ICD-10-CM | POA: Diagnosis not present

## 2018-06-06 DIAGNOSIS — F419 Anxiety disorder, unspecified: Secondary | ICD-10-CM | POA: Diagnosis not present

## 2018-06-06 DIAGNOSIS — F329 Major depressive disorder, single episode, unspecified: Secondary | ICD-10-CM | POA: Diagnosis not present

## 2018-06-06 DIAGNOSIS — I10 Essential (primary) hypertension: Secondary | ICD-10-CM | POA: Diagnosis not present

## 2018-06-06 DIAGNOSIS — R7303 Prediabetes: Secondary | ICD-10-CM | POA: Diagnosis not present

## 2018-06-06 DIAGNOSIS — M48062 Spinal stenosis, lumbar region with neurogenic claudication: Secondary | ICD-10-CM | POA: Diagnosis not present

## 2018-06-10 DIAGNOSIS — I10 Essential (primary) hypertension: Secondary | ICD-10-CM | POA: Diagnosis not present

## 2018-06-10 DIAGNOSIS — M4326 Fusion of spine, lumbar region: Secondary | ICD-10-CM | POA: Diagnosis not present

## 2018-06-10 DIAGNOSIS — F4310 Post-traumatic stress disorder, unspecified: Secondary | ICD-10-CM | POA: Diagnosis not present

## 2018-06-10 DIAGNOSIS — G4730 Sleep apnea, unspecified: Secondary | ICD-10-CM | POA: Diagnosis not present

## 2018-06-10 DIAGNOSIS — F419 Anxiety disorder, unspecified: Secondary | ICD-10-CM | POA: Diagnosis not present

## 2018-06-10 DIAGNOSIS — M48062 Spinal stenosis, lumbar region with neurogenic claudication: Secondary | ICD-10-CM | POA: Diagnosis not present

## 2018-06-10 DIAGNOSIS — Z4789 Encounter for other orthopedic aftercare: Secondary | ICD-10-CM | POA: Diagnosis not present

## 2018-06-10 DIAGNOSIS — G4089 Other seizures: Secondary | ICD-10-CM | POA: Diagnosis not present

## 2018-06-10 DIAGNOSIS — D62 Acute posthemorrhagic anemia: Secondary | ICD-10-CM | POA: Diagnosis not present

## 2018-06-10 DIAGNOSIS — F329 Major depressive disorder, single episode, unspecified: Secondary | ICD-10-CM | POA: Diagnosis not present

## 2018-06-10 DIAGNOSIS — Z981 Arthrodesis status: Secondary | ICD-10-CM | POA: Diagnosis not present

## 2018-06-10 DIAGNOSIS — R7303 Prediabetes: Secondary | ICD-10-CM | POA: Diagnosis not present

## 2018-06-11 DIAGNOSIS — R7303 Prediabetes: Secondary | ICD-10-CM | POA: Diagnosis not present

## 2018-06-11 DIAGNOSIS — G4730 Sleep apnea, unspecified: Secondary | ICD-10-CM | POA: Diagnosis not present

## 2018-06-11 DIAGNOSIS — M48062 Spinal stenosis, lumbar region with neurogenic claudication: Secondary | ICD-10-CM | POA: Diagnosis not present

## 2018-06-11 DIAGNOSIS — D62 Acute posthemorrhagic anemia: Secondary | ICD-10-CM | POA: Diagnosis not present

## 2018-06-11 DIAGNOSIS — F329 Major depressive disorder, single episode, unspecified: Secondary | ICD-10-CM | POA: Diagnosis not present

## 2018-06-11 DIAGNOSIS — G4089 Other seizures: Secondary | ICD-10-CM | POA: Diagnosis not present

## 2018-06-11 DIAGNOSIS — Z981 Arthrodesis status: Secondary | ICD-10-CM | POA: Diagnosis not present

## 2018-06-11 DIAGNOSIS — M4326 Fusion of spine, lumbar region: Secondary | ICD-10-CM | POA: Diagnosis not present

## 2018-06-11 DIAGNOSIS — F419 Anxiety disorder, unspecified: Secondary | ICD-10-CM | POA: Diagnosis not present

## 2018-06-11 DIAGNOSIS — Z4789 Encounter for other orthopedic aftercare: Secondary | ICD-10-CM | POA: Diagnosis not present

## 2018-06-11 DIAGNOSIS — I10 Essential (primary) hypertension: Secondary | ICD-10-CM | POA: Diagnosis not present

## 2018-06-11 DIAGNOSIS — F4310 Post-traumatic stress disorder, unspecified: Secondary | ICD-10-CM | POA: Diagnosis not present

## 2018-06-12 DIAGNOSIS — M5416 Radiculopathy, lumbar region: Secondary | ICD-10-CM | POA: Diagnosis not present

## 2018-06-13 DIAGNOSIS — G4089 Other seizures: Secondary | ICD-10-CM | POA: Diagnosis not present

## 2018-06-13 DIAGNOSIS — F4312 Post-traumatic stress disorder, chronic: Secondary | ICD-10-CM | POA: Diagnosis not present

## 2018-06-13 DIAGNOSIS — F419 Anxiety disorder, unspecified: Secondary | ICD-10-CM | POA: Diagnosis not present

## 2018-06-13 DIAGNOSIS — Z4789 Encounter for other orthopedic aftercare: Secondary | ICD-10-CM | POA: Diagnosis not present

## 2018-06-13 DIAGNOSIS — R7303 Prediabetes: Secondary | ICD-10-CM | POA: Diagnosis not present

## 2018-06-13 DIAGNOSIS — M4326 Fusion of spine, lumbar region: Secondary | ICD-10-CM | POA: Diagnosis not present

## 2018-06-13 DIAGNOSIS — F4310 Post-traumatic stress disorder, unspecified: Secondary | ICD-10-CM | POA: Diagnosis not present

## 2018-06-13 DIAGNOSIS — F329 Major depressive disorder, single episode, unspecified: Secondary | ICD-10-CM | POA: Diagnosis not present

## 2018-06-13 DIAGNOSIS — Z981 Arthrodesis status: Secondary | ICD-10-CM | POA: Diagnosis not present

## 2018-06-13 DIAGNOSIS — I10 Essential (primary) hypertension: Secondary | ICD-10-CM | POA: Diagnosis not present

## 2018-06-13 DIAGNOSIS — G4730 Sleep apnea, unspecified: Secondary | ICD-10-CM | POA: Diagnosis not present

## 2018-06-13 DIAGNOSIS — D62 Acute posthemorrhagic anemia: Secondary | ICD-10-CM | POA: Diagnosis not present

## 2018-06-13 DIAGNOSIS — M48062 Spinal stenosis, lumbar region with neurogenic claudication: Secondary | ICD-10-CM | POA: Diagnosis not present

## 2018-06-14 ENCOUNTER — Encounter (HOSPITAL_COMMUNITY): Payer: Self-pay | Admitting: Neurological Surgery

## 2018-06-18 ENCOUNTER — Encounter: Payer: Self-pay | Admitting: Family Medicine

## 2018-06-18 DIAGNOSIS — Z0189 Encounter for other specified special examinations: Secondary | ICD-10-CM | POA: Insufficient documentation

## 2018-06-21 DIAGNOSIS — F4312 Post-traumatic stress disorder, chronic: Secondary | ICD-10-CM | POA: Diagnosis not present

## 2018-06-27 DIAGNOSIS — F4312 Post-traumatic stress disorder, chronic: Secondary | ICD-10-CM | POA: Diagnosis not present

## 2018-07-04 DIAGNOSIS — F4312 Post-traumatic stress disorder, chronic: Secondary | ICD-10-CM | POA: Diagnosis not present

## 2018-07-11 DIAGNOSIS — G479 Sleep disorder, unspecified: Secondary | ICD-10-CM | POA: Diagnosis not present

## 2018-07-12 DIAGNOSIS — F4312 Post-traumatic stress disorder, chronic: Secondary | ICD-10-CM | POA: Diagnosis not present

## 2018-07-18 DIAGNOSIS — F4312 Post-traumatic stress disorder, chronic: Secondary | ICD-10-CM | POA: Diagnosis not present

## 2018-07-25 DIAGNOSIS — F4312 Post-traumatic stress disorder, chronic: Secondary | ICD-10-CM | POA: Diagnosis not present

## 2018-08-01 DIAGNOSIS — F4312 Post-traumatic stress disorder, chronic: Secondary | ICD-10-CM | POA: Diagnosis not present

## 2018-08-06 ENCOUNTER — Emergency Department (HOSPITAL_COMMUNITY)
Admission: EM | Admit: 2018-08-06 | Discharge: 2018-08-06 | Disposition: A | Discharge status: Home / Self Care | Payer: BC Managed Care – PPO | Attending: Emergency Medicine | Admitting: Emergency Medicine

## 2018-08-06 ENCOUNTER — Encounter (HOSPITAL_COMMUNITY): Payer: Self-pay

## 2018-08-06 ENCOUNTER — Other Ambulatory Visit: Payer: Self-pay

## 2018-08-06 DIAGNOSIS — K529 Noninfective gastroenteritis and colitis, unspecified: Secondary | ICD-10-CM | POA: Diagnosis not present

## 2018-08-06 DIAGNOSIS — R0789 Other chest pain: Secondary | ICD-10-CM | POA: Diagnosis not present

## 2018-08-06 DIAGNOSIS — R112 Nausea with vomiting, unspecified: Secondary | ICD-10-CM | POA: Diagnosis not present

## 2018-08-06 DIAGNOSIS — Z87891 Personal history of nicotine dependence: Secondary | ICD-10-CM | POA: Diagnosis not present

## 2018-08-06 DIAGNOSIS — Z79899 Other long term (current) drug therapy: Secondary | ICD-10-CM | POA: Insufficient documentation

## 2018-08-06 DIAGNOSIS — I10 Essential (primary) hypertension: Secondary | ICD-10-CM | POA: Diagnosis not present

## 2018-08-06 DIAGNOSIS — R197 Diarrhea, unspecified: Secondary | ICD-10-CM | POA: Diagnosis not present

## 2018-08-06 LAB — COMPREHENSIVE METABOLIC PANEL
ALT: 33 U/L (ref 0–44)
AST: 23 U/L (ref 15–41)
Albumin: 4.2 g/dL (ref 3.5–5.0)
Alkaline Phosphatase: 152 U/L — ABNORMAL HIGH (ref 38–126)
Anion gap: 8 (ref 5–15)
BUN: 11 mg/dL (ref 6–20)
CO2: 23 mmol/L (ref 22–32)
Calcium: 9.1 mg/dL (ref 8.9–10.3)
Chloride: 108 mmol/L (ref 98–111)
Creatinine, Ser: 0.57 mg/dL (ref 0.44–1.00)
GFR calc Af Amer: >60 mL/min (ref >60)
GFR calc non Af Amer: >60 mL/min (ref >60)
Glucose, Bld: 111 mg/dL — ABNORMAL HIGH (ref 70–99)
Potassium: 3.6 mmol/L (ref 3.5–5.1)
Sodium: 139 mmol/L (ref 135–145)
Total Bilirubin: 0.5 mg/dL (ref 0.3–1.2)
Total Protein: 7.8 g/dL (ref 6.5–8.1)

## 2018-08-06 LAB — LIPASE, BLOOD: Lipase: 25 U/L (ref 11–51)

## 2018-08-06 LAB — CBC
HCT: 44.4 % (ref 36.0–46.0)
Hemoglobin: 14 g/dL (ref 12.0–15.0)
MCH: 28.2 pg (ref 26.0–34.0)
MCHC: 31.5 g/dL (ref 30.0–36.0)
MCV: 89.5 fL (ref 80.0–100.0)
Platelets: 351 10*3/uL (ref 150–400)
RBC: 4.96 MIL/uL (ref 3.87–5.11)
RDW: 13.5 % (ref 11.5–15.5)
WBC: 11 10*3/uL — ABNORMAL HIGH (ref 4.0–10.5)
nRBC: 0 % (ref 0.0–0.2)

## 2018-08-06 LAB — TROPONIN I: Troponin I: <0.03 ng/mL (ref <0.03)

## 2018-08-06 MED ORDER — PROMETHAZINE HCL 25 MG PO TABS: 25.0000 mg | ORAL_TABLET | Freq: Three times a day (TID) | ORAL | 0 refills | Status: DC | PRN

## 2018-08-06 MED ORDER — PROMETHAZINE HCL 12.5 MG PO TABS
25.0000 mg | ORAL_TABLET | Freq: Once | ORAL | Status: AC
  Administered 2018-08-06: 25 mg via ORAL
  Filled 2018-08-06: qty 2

## 2018-08-06 MED ORDER — SODIUM CHLORIDE 0.9 % IV BOLUS
1000.0000 mL | Freq: Once | INTRAVENOUS | Status: AC
  Administered 2018-08-06: 1000 mL via INTRAVENOUS

## 2018-08-06 MED ORDER — METOCLOPRAMIDE HCL 5 MG/ML IJ SOLN
10.0000 mg | Freq: Once | INTRAMUSCULAR | Status: AC
  Administered 2018-08-06: 23:00:00 10 mg via INTRAVENOUS
  Filled 2018-08-06: qty 2

## 2018-08-06 MED ORDER — SODIUM CHLORIDE 0.9% FLUSH: 3.0000 mL | Freq: Once | INTRAVENOUS | Status: DC

## 2018-08-06 MED ORDER — DIPHENHYDRAMINE HCL 50 MG/ML IJ SOLN
25.0000 mg | Freq: Once | INTRAMUSCULAR | Status: AC
  Administered 2018-08-06: 23:00:00 25 mg via INTRAVENOUS
  Filled 2018-08-06: qty 1

## 2018-08-06 NOTE — Discharge Instructions (Signed)
If you have intractable vomiting, abdominal pain, blood in your diarrhea or vomit, fever, or any other new/concerning symptoms then return to the ER for evaluation.

## 2018-08-06 NOTE — ED Provider Notes (Signed)
[facility] EMERGENCY DEPARTMENT Provider Note   CSN: [id number] Arrival date & time: 08/06/18  1907    History   Chief Complaint Chief Complaint  Patient presents with  . Diarrhea    HPI [name] is a 53 y.o. female.     HPI  53 year old female presents with diarrhea.  She is been having numerous watery bowel movements since this morning.  No fevers.  No blood in her stool.  No abdominal pain though there is some heaviness to her upper abdomen/lower chest.  She had one episode of dry heaving/vomiting but none since.  She has had persistent nausea.  No recent antibiotics or travel.  No other symptoms such as cough or shortness of breath.  Past Medical History:  Diagnosis Date  . Anxiety   . Depression   . Encounter for neuropsychological testing    at [facility] 3/20-suggest possible somatization  . Headache   . Hypertension   . Pseudoseizure   . PTSD (post-traumatic stress disorder)   . Rosacea   . Seizures [facility])    [facility] (Dr. 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 Sleep apnea     Patient Active Problem List   Diagnosis Date Noted  . Encounter for neuropsychological testing   . Lumbar stenosis with neurogenic claudication 05/23/2018  . Seizures ([place])   . 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 Jerking movements of extremities 11/11/2015  . Major depressive disorder, recurrent severe without psychotic features ([place])   . Bipolar II disorder ([name]) 08/30/2015  . Suicide attempt by drug ingestion ([place]) 08/30/2015  . Suicidal ideation 08/30/2015  . Migraines 09/29/2014  . Migraine 05/19/2013  . Left-sided weakness 05/18/2013  . Numbness and tingling of left arm and leg 05/18/2013  . CVA (cerebral infarction) 05/18/2013  . Hypertension   . Anxiety     Past Surgical History:  Procedure Laterality Date  . ABDOMINAL HYSTERECTOMY     non cancerous, partial  . APPENDECTOMY    . appendectomy    . BACK  SURGERY     x2  . CHOLECYSTECTOMY    . CHOLECYSTECTOMY    . CYST EXCISION     on ovaries  . lumbar     ruptured disc in lumbar taken out  . ROTATOR CUFF REPAIR Left   . TONSILLECTOMY    . TUBAL LIGATION       OB History    Gravida  2   Para      Term      Preterm      AB      Living  2     SAB      TAB      Ectopic      Multiple      Live Births               Home Medications    Prior to Admission medications   Medication Sig Start Date End Date Taking? Authorizing Provider  AIMOVIG 140 MG/ML SOAJ Inject 140 mg into the skin every 28 (twenty-eight) days.  11/09/17   [provider]  amLODipine (NORVASC) 5 MG tablet Take 1 tablet (5 mg total) by mouth daily. 03/01/18   [name], MD  Cyanocobalamin (B-12 PO) Take 1 tablet by mouth daily as needed (fatigue).     [provider]  diazepam (VALIUM) 2 MG tablet Take 2 mg by mouth every 8 (eight) hours as needed for anxiety.  [provider]  DULoxetine (CYMBALTA) 60 MG capsule Take 120 mg by mouth daily. 04/22/18   [provider]  erythromycin ophthalmic ointment Apply three times a day to affected eye for 5 days 05/29/18   Alycia Rossetti, MD  hydrochlorothiazide (HYDRODIURIL) 25 MG tablet Take 1 tablet (25 mg total) by mouth daily. 11/26/17   [name], MD  hydrOXYzine (ATARAX/VISTARIL) 50 MG tablet Take 1 tablet (50 mg total) by mouth every 4 (four) hours as needed for itching. 09/03/15   Pucilowska, Jolanta B, MD  methocarbamol (ROBAXIN) 500 MG tablet Take 1 tablet (500 mg total) by mouth every 6 (six) hours as needed for muscle spasms. 05/26/18   Kristeen Miss, MD  mirtazapine (REMERON) 15 MG tablet Take 15 mg by mouth at bedtime.  12/12/17   [provider]  oxyCODONE-acetaminophen (PERCOCET) 10-325 MG tablet Take 1-2 tablets by mouth every 4 (four) hours as needed for pain. 05/26/18   Kristeen Miss, MD  promethazine (PHENERGAN) 25 MG tablet Take 1  tablet (25 mg total) by mouth every 8 (eight) hours as needed for nausea or vomiting. 08/06/18   Sherwood Gambler, MD  QUEtiapine (SEROQUEL) 25 MG tablet Take 25 mg by mouth at bedtime.  12/10/17 05/08/18  [provider]    Family History Family History  Problem Relation Age of Onset  . Diabetes Father   . Heart disease Father 10  . Hypertension Father   . Cancer Maternal Grandfather        colon cancer (70's)  . Diabetes Paternal Grandmother   . Diabetes Paternal Grandfather   . Cancer Cousin        breast  . Breast cancer Cousin   . Breast cancer Paternal Aunt   . Breast cancer Cousin   . Breast cancer Cousin     Social History Social History   Tobacco Use  . Smoking status: Former Smoker    Packs/day: 0.50    Years: 10.00    Pack years: 5.00    Types: Cigarettes    Last attempt to quit: 05/15/1991    Years since quitting: 27.2  . Smokeless tobacco: Never Used  Substance Use Topics  . Alcohol use: No    Alcohol/week: 0.0 standard drinks    Comment: occassionally  . Drug use: No     Allergies   Penicillins; Toradol [ketorolac tromethamine]; Lamotrigine; and Zofran [ondansetron hcl]   Review of Systems Review of Systems  Constitutional: Negative for fever.  Cardiovascular: Positive for chest pain.  Gastrointestinal: Positive for diarrhea, nausea and vomiting. Negative for abdominal pain and blood in stool.  Genitourinary: Positive for decreased urine volume. Negative for dysuria.  All other systems reviewed and are negative.    Physical Exam Updated Vital Signs BP (!) 143/86 (BP Location: Right Arm)   Pulse 93   Temp 98.4 F (36.9 C) (Oral)   Resp 18   Ht 5\' 7"  (1.702 m)   Wt 103.9 kg   SpO2 99%   BMI 35.87 kg/m   Physical Exam Vitals signs and nursing note reviewed.  Constitutional:      Appearance: She is well-developed. She is obese.  HENT:     Head: Normocephalic and atraumatic.     Right Ear: External ear normal.     Left Ear:  External ear normal.     Nose: Nose normal.  Eyes:     General:        Right eye: No discharge.  Left eye: No discharge.  Cardiovascular:     Rate and Rhythm: Normal rate and regular rhythm.     Heart sounds: Normal heart sounds.  Pulmonary:     Effort: Pulmonary effort is normal.     Breath sounds: Normal breath sounds.  Chest:     Chest wall: No tenderness.  Abdominal:     Palpations: Abdomen is soft.     Tenderness: There is no abdominal tenderness.  Skin:    General: Skin is warm and dry.  Neurological:     Mental Status: She is alert.  Psychiatric:        Mood and Affect: Mood is not anxious.      ED Treatments / Results  Labs (all labs ordered are listed, but only abnormal results are displayed) Labs Reviewed  COMPREHENSIVE METABOLIC PANEL - Abnormal; Notable for the following components:      Result Value   Glucose, Bld 111 (*)    Alkaline Phosphatase 152 (*)    All other components within normal limits  CBC - Abnormal; Notable for the following components:   WBC 11.0 (*)    All other components within normal limits  LIPASE, BLOOD  TROPONIN I  URINALYSIS, ROUTINE W REFLEX MICROSCOPIC    EKG EKG Interpretation  Date/Time:  Tuesday August 06 2018 19:28:59 EDT Ventricular Rate:  85 PR Interval:  146 QRS Duration: 76 QT Interval:  352 QTC Calculation: 418 R Axis:   41 Text Interpretation:  Normal sinus rhythm no acute ST/T changes no significant change since June 2019 Confirmed by Sherwood Gambler [phone]) on 08/06/2018 9:34:05 PM   Radiology No results found.  Procedures Procedures (including critical care time)  Medications Ordered in ED Medications  sodium chloride flush (NS) 0.9 % injection 3 mL (has no administration in time range)  sodium chloride 0.9 % bolus 1,000 mL (1,000 mLs Intravenous New Bag/Given 08/06/18 2237)  metoCLOPramide (REGLAN) injection 10 mg (10 mg Intravenous Given 08/06/18 2237)  diphenhydrAMINE (BENADRYL) injection 25 mg  (25 mg Intravenous Given 08/06/18 2302)  promethazine (PHENERGAN) tablet 25 mg (25 mg Oral Given 08/06/18 2323)     Initial Impression / Assessment and Plan / ED Course  I have reviewed the triage vital signs and the nursing notes.  Pertinent labs & imaging results that were available during my care of the patient were reviewed by me and considered in my medical decision making (see chart for details).        Patient likely has a viral gastroenteritis.  Electrolytes are reassuring.  She is complaining of some chest pressure which seems to be more upper abdominal.  Troponin and ECG are benign.  I doubt ACS.  At this point her abdominal exam is pretty benign as I do not think she needs an emergent CT or ultrasound.  Discussed supportive care and return precautions.  Final Clinical Impressions(s) / ED Diagnoses   Final diagnoses:  Acute gastroenteritis    ED Discharge Orders         Ordered    promethazine (PHENERGAN) 25 MG tablet  Every 8 hours PRN     08/06/18 2334           Sherwood Gambler, MD 08/06/18 2335

## 2018-08-06 NOTE — ED Triage Notes (Signed)
Pt presents to ED with complaints of diarrhea which started this am, dry heaves and nausea all day. Pt also states she feels heaviness in her chest.

## 2018-08-08 DIAGNOSIS — F4312 Post-traumatic stress disorder, chronic: Secondary | ICD-10-CM | POA: Diagnosis not present

## 2018-08-14 DIAGNOSIS — M5416 Radiculopathy, lumbar region: Secondary | ICD-10-CM | POA: Diagnosis not present

## 2018-08-22 DIAGNOSIS — F4312 Post-traumatic stress disorder, chronic: Secondary | ICD-10-CM | POA: Diagnosis not present

## 2018-08-23 ENCOUNTER — Telehealth (HOSPITAL_COMMUNITY): Payer: Self-pay

## 2018-08-23 NOTE — Telephone Encounter (Signed)
I called [name] to offer her and AM appointment time for her evaluation on 08/28/18. She stated she would need to reschedule her evaluation to a later time in July as she had some vacations and trips scheduled that would interfere with the appointment ton the 24th. I rescheduled her appointment for 09/18/18 at 10:15 AM.  Kipp Brood, PT, DPT, [facility] Physical Therapist with [facility]  08/23/2018 10:49 AM

## 2018-08-28 ENCOUNTER — Encounter (HOSPITAL_COMMUNITY): Payer: Self-pay

## 2018-08-28 ENCOUNTER — Ambulatory Visit (HOSPITAL_COMMUNITY): Payer: BC Managed Care – PPO

## 2018-08-29 DIAGNOSIS — F4312 Post-traumatic stress disorder, chronic: Secondary | ICD-10-CM | POA: Diagnosis not present

## 2018-09-18 ENCOUNTER — Ambulatory Visit (HOSPITAL_COMMUNITY): Payer: BC Managed Care – PPO | Attending: Neurological Surgery

## 2018-09-18 ENCOUNTER — Other Ambulatory Visit: Payer: Self-pay

## 2018-09-18 ENCOUNTER — Encounter (HOSPITAL_COMMUNITY): Payer: Self-pay

## 2018-09-18 DIAGNOSIS — M6281 Muscle weakness (generalized): Secondary | ICD-10-CM | POA: Insufficient documentation

## 2018-09-18 DIAGNOSIS — M545 Low back pain, unspecified: Secondary | ICD-10-CM

## 2018-09-18 DIAGNOSIS — G8929 Other chronic pain: Secondary | ICD-10-CM | POA: Diagnosis not present

## 2018-09-18 NOTE — Therapy (Signed)
[place], [place], [address] Phone: [phone]   Fax:  [phone]  Physical Therapy Evaluation  Patient Details  Name: [name] MRN: [id number] Date of Birth: 1966/02/03 Referring Provider (PT): Kristeen Miss, MD   Encounter Date: 09/18/2018  PT End of Session - 09/18/18 1044    Visit Number  1    Number of Visits  12    Date for PT Re-Evaluation  10/30/18    Authorization Type  Medicare Part A & B (BCBS secondary)    Authorization Time Period  09/18/2018-10/30/2018    Authorization - Visit Number  1    Authorization - Number of Visits  10    PT Start Time  1026    PT Stop Time  1116    PT Time Calculation (min)  50 min    Activity Tolerance  Patient tolerated treatment well    Behavior During Therapy  [facility] for tasks assessed/performed       Past Medical History:  Diagnosis Date  . Anxiety   . Depression   . Encounter for neuropsychological testing    at [facility] 3/20-suggest possible somatization  . Headache   . Hypertension   . Pseudoseizure   . PTSD (post-traumatic stress disorder)   . Rosacea   . Seizures [facility])    [facility] (Dr. 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 Sleep apnea     Past Surgical History:  Procedure Laterality Date  . ABDOMINAL HYSTERECTOMY     non cancerous, partial  . APPENDECTOMY    . appendectomy    . BACK SURGERY     x2  . CHOLECYSTECTOMY    . CHOLECYSTECTOMY    . CYST EXCISION     on ovaries  . lumbar     ruptured disc in lumbar taken out  . ROTATOR CUFF REPAIR Left   . TONSILLECTOMY    . TUBAL LIGATION      There were no vitals filed for this visit.   Subjective Assessment - 09/18/18 1045    Subjective  Patient reports she has less pain than she did prior to her surgery in March. She no longer has pain in her LE's and it is centralized to her low back and bil buttocks. She can perform  daily tasks such as cooking and cleaning but sometimes has pain with heavier tasks. She also has pain at night when going to bed but reports after laying down for some time it starts to get better.    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  Yes    Pain Score  5     Pain Location  Back    Pain Orientation  Lower;Mid    Pain Descriptors / Indicators  Aching;Dull    Pain Type  Chronic pain    Pain Radiating Towards  radiates in bil gluteus maximus and feels sharp    Pain Onset  More than a month ago    Pain Frequency  Constant   fluctuates in severity        [facility] PT Assessment - 09/18/18 0001      Assessment   Medical  Diagnosis  Low Back Pain    Referring Provider (PT)  Kristeen Miss, MD    Onset Date/Surgical Date  05/23/18    Hand Dominance  Right    Next MD Visit  F/u with Dr. Ellene Route in September    Prior Therapy  yes before back surgeries and after      Precautions   Precautions  None    Precaution Comments  last f/u in June Dr. Ellene Route told pt she can perform activities within tolerance      Restrictions   Weight Bearing Restrictions  No      Balance Screen   Has the patient fallen in the past 6 months  No    Has the patient had a decrease in activity level because of a fear of falling?   No    Is the patient reluctant to leave their home because of a fear of falling?   No      Home Environment   Living Environment  Private residence    Living Arrangements  Spouse/significant other    Available Help at Discharge  Family    Type of [place] to enter    Entrance Stairs-Number of Steps  [place]  One level    Additional Comments  [place]  with her husband. Her daughter and brother also live on the property in their own homes. She sometimes helps her mother to care for her grandmother but her mother is independent.       Prior Function   Level of Independence  Independent    Vocation  On disability   related to seizures not her back   [employer]  pt worked as a Psychologist, sport and exercise    Leisure  pt enjoys walking, camping, fishing, doing crafts, and swimming.      Cognition   Overall Cognitive Status  Within Functional Limits for tasks assessed      Observation/Other Assessments   Focus on Therapeutic Outcomes (FOTO)   37% limited      ROM / Strength   AROM / PROM / Strength  Strength      Strength   Strength Assessment Site  Hip;Knee;Ankle    Right Hip Flexion  4+/5    Right Hip Extension  4+/5    Right Hip ABduction  4/5    Left Hip Flexion  4+/5    Left Hip Extension  4/5    Left Hip ABduction  4/5    Right/Left Knee  Right;Left    Right Knee Flexion  4+/5    Right Knee Extension  5/5    Left Knee Flexion  4+/5    Left Knee Extension  5/5    Right Ankle Dorsiflexion  5/5    Right Ankle Plantar Flexion  4+/5    Left Ankle Dorsiflexion  5/5    Left Ankle Plantar Flexion  4+/5      Flexibility   Soft Tissue Assessment /Muscle Length  yes    Hamstrings  Rt = 90/170; Lt = 90/170   stretch/tension relieved with ankle plantar flexion     Palpation   Palpation comment  patient's surgical scar has slightly reduced mobility      Ambulation/Gait   Ambulation/Gait  Yes    Ambulation/Gait Assistance  7: Independent    Ambulation Distance (Feet)  604 Feet   2MWT  Assistive device  None    Gait Pattern  Within Functional Limits;Trendelenburg   slight excesive trunk rotation   Ambulation Surface  Level;Indoor    Gait velocity  1.5 m/s    Gait Comments  observed pt walking barefoot and she has neutral foot position and maintains good alignment with no excessive pronation or supination during gait. no knee  valgus or excessive hip external rotation.        Objective measurements completed on examination: See above findings.       PT Education - 09/18/18 1041    Education Details  Educated on appropriate POC based on findings. Educated on proper shoe recommendation for neutral fit and on appropriate store and options for where to obtain shoes. Educated on blue light blocker glasses to help with headaches if patient is trying to return to work.    Person(s) Educated  Patient    Methods  Explanation    Comprehension  Verbalized understanding       PT Short Term Goals - 09/18/18 1316      PT SHORT TERM GOAL #1   Title  Patient will de independent with HEP, updated, PRN, to improme strength and functional mobility and endurance.    Period  Weeks    Status  New    Target Date  10/02/18      PT SHORT TERM GOAL #2   Title  Patient will be able to walk 10 minutes or > 3x/week for regular exercise and wellness.    Time  3    Period  Weeks    Status  New    Target Date  10/09/18        PT Long Term Goals - 09/18/18 1320      PT LONG TERM GOAL #1   Title  Patient will have no greater than 3/10 pain while performing daily/weekly household cleaning activities including vacuuming and moving furniture to do so.    Time  6    Period  Weeks    Status  New    Target Date  10/30/18      PT LONG TERM GOAL #2   Title  Patient will participate in swimming laps or exercise routine 2x/week to improve strength and overall fitness/wellness.    Time  6    Period  Weeks    Status  New    Target Date  10/30/18      PT LONG TERM GOAL #3   Title  Patient will no longer have pain at night when laying down to go to sleep to improve her ability to sleep and QOL.    Time  6    Period  Weeks    Status  New    Target Date  10/30/18        Plan - 09/18/18 1328    Clinical Impression Statement  Ms. Gotto is a 53 y/o female presenting to physical therapy for evaluation of her chronic low back  pain. She has a significant history for 3 lumbar surgeries at level's L3-4, L4-5, and L5-S1. Since her last surgery at L4-5 on 05/23/18 he no longer experiences symptoms distal from her buttock in her LE's. She does continue to have pain in the central lumbar spine at ~ L3 and into bil gluteus maximus. Occasionally she experiences radiating pain into her lateral and medial thighs and describes this as cramping. She has good strength throughout LE's with limitations at bil hip extensors and abductors. Her ambulating speed  is WNL's but she demonstrates slight trendelenburg gait Lt greater than Rt. Her FOTO score places her above the typical limitations for her demographic and she is able to complete most daily activities but has some pain with it. Ms. Collyer will benefit from skilled PT interventions to reduce pain exacerbation with daily activities and improve health and wellness by developing regular exercise routine.    Personal Factors and Comorbidities  Past/Current Experience;Comorbidity 3+    Comorbidities  HTN, PTSD, Anxiety, Depression    Examination-Activity Limitations  Lift;Locomotion Level;Stand;Squat;Stairs;Caring for Others;Bend    Examination-Participation Restrictions  Cleaning;Laundry    Stability/Clinical Decision Making  Stable/Uncomplicated    Clinical Decision Making  Low    Rehab Potential  Good    PT Frequency  2x / week    PT Duration  6 weeks    PT Treatment/Interventions  ADLs/Self Care Home Management;Aquatic Therapy;Cryotherapy;Electrical Stimulation;Iontophoresis 4mg /ml Dexamethasone;Moist Heat;Stair training;Functional mobility training;Gait training;Therapeutic activities;Therapeutic exercise;Balance training;Neuromuscular re-education;Patient/family education;Manual techniques;Scar mobilization;Passive range of motion;Taping    PT Next Visit Plan  Review goals. Discuss exercise equipment available at pt's home if any. Initiate hip strengthening and lumbar stretches.     Consulted and Agree with Plan of Care  Patient       Patient will benefit from skilled therapeutic intervention in order to improve the following deficits and impairments:  Abnormal gait, Decreased activity tolerance, Decreased endurance, Decreased mobility, Decreased strength, Impaired flexibility, Increased fascial restricitons, Pain, Obesity, Postural dysfunction  Visit Diagnosis: 1. Chronic midline low back pain without sciatica   2. Muscle weakness (generalized)        Problem List Patient Active Problem List   Diagnosis Date Noted  . Encounter for neuropsychological testing   . Lumbar stenosis with neurogenic claudication 05/23/2018  . Seizures ([place])   . 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 Jerking movements of extremities 11/11/2015  . Major depressive disorder, recurrent severe without psychotic features ([place])   . Bipolar II disorder ([place]) 08/30/2015  . Suicide attempt by drug ingestion ([place]) 08/30/2015  . Suicidal ideation 08/30/2015  . Migraines 09/29/2014  . Migraine 05/19/2013  . Left-sided weakness 05/18/2013  . Numbness and tingling of left arm and leg 05/18/2013  . CVA (cerebral infarction) 05/18/2013  . Hypertension   . Anxiety     Kipp Brood, PT, DPT, [facility] Physical Therapist with [facility]  09/18/2018 1:46 PM    [facility] [address] [place], [place], [address] Phone: [phone]   Fax:  [phone]  Name: [name] MRN: [id number] Date of Birth: 12/14/1965

## 2018-09-18 NOTE — Patient Instructions (Signed)
[employer]    Directions  Website Address: [address] [place], [place] [address]  Phone: [phone]   This store will offer a variety of neutral running or walking shoes. Try neutral shoes and try this as your current shoe has too much support.

## 2018-09-19 DIAGNOSIS — F4312 Post-traumatic stress disorder, chronic: Secondary | ICD-10-CM | POA: Diagnosis not present

## 2018-09-20 ENCOUNTER — Ambulatory Visit (HOSPITAL_COMMUNITY): Payer: BC Managed Care – PPO | Admitting: Physical Therapy

## 2018-09-26 DIAGNOSIS — F4312 Post-traumatic stress disorder, chronic: Secondary | ICD-10-CM | POA: Diagnosis not present

## 2018-10-02 ENCOUNTER — Encounter (HOSPITAL_COMMUNITY): Payer: Self-pay

## 2018-10-02 ENCOUNTER — Other Ambulatory Visit: Payer: Self-pay

## 2018-10-02 ENCOUNTER — Ambulatory Visit (HOSPITAL_COMMUNITY): Payer: BC Managed Care – PPO

## 2018-10-02 DIAGNOSIS — G8929 Other chronic pain: Secondary | ICD-10-CM | POA: Diagnosis not present

## 2018-10-02 DIAGNOSIS — M545 Low back pain: Secondary | ICD-10-CM | POA: Diagnosis not present

## 2018-10-02 DIAGNOSIS — M6281 Muscle weakness (generalized): Secondary | ICD-10-CM

## 2018-10-02 NOTE — Patient Instructions (Signed)
Bridge    Lie back, legs bent. Inhale, pressing hips up. Keeping ribs in, lengthen lower back. Exhale, rolling down along spine from top. Repeat 10 times. Do 1-2 sessions per day.  http://pm.exer.us/55   Copyright  VHI. All rights reserved.   Abduction: Clam (Eccentric) - Side-Lying    Lie on side with knees bent. Lift top knee, keeping feet together. Keep trunk steady. Slowly lower for 3-5 seconds. 10 reps per set, 1-2 sets per day, 3-4 days per week.  http://ecce.exer.us/65   Copyright  VHI. All rights reserved.   Knee to Chest (Flexion)    Pull knee toward chest. Feel stretch in lower back or buttock area. Breathing deeply, Hold 30 seconds. Repeat with other knee. Repeat 3 times. Do 2 sessions per day.  http://gt2.exer.us/226   Copyright  VHI. All rights reserved.   Hamstring: Towel Stretch (Supine)    Lie on back. Loop towel around left foot, hip and knee at 90. Straighten knee and pull foot toward body. Hold 30 seconds. Relax. Repeat 3 times. Do 2 times a day. Repeat with other leg.  Copyright  VHI. All rights reserved.   Walking progam:  Start a walking program trying to increase time as able 3-4x a week

## 2018-10-02 NOTE — Therapy (Signed)
[place], [place], [address] Phone: [phone]   Fax:  [phone]  Physical Therapy Treatment  Patient Details  Name: [name] MRN: [id number] Date of Birth: 05/15/1965 Referring Provider (PT): Kristeen Miss, MD   Encounter Date: 10/02/2018  PT End of Session - 10/02/18 1046    Visit Number  2    Number of Visits  12    Date for PT Re-Evaluation  10/30/18    Authorization Type  Medicare Part A & B (BCBS secondary)    Authorization Time Period  09/18/2018-10/30/2018    Authorization - Visit Number  2    Authorization - Number of Visits  10    PT Start Time  1020    PT Stop Time  1102    PT Time Calculation (min)  42 min    Activity Tolerance  Patient tolerated treatment well    Behavior During Therapy  [facility] for tasks assessed/performed       Past Medical History:  Diagnosis Date  . Anxiety   . Depression   . Encounter for neuropsychological testing    at [facility] 3/20-suggest possible somatization  . Headache   . Hypertension   . Pseudoseizure   . PTSD (post-traumatic stress disorder)   . Rosacea   . Seizures [facility])    [facility] (Dr. 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 Sleep apnea     Past Surgical History:  Procedure Laterality Date  . ABDOMINAL HYSTERECTOMY     non cancerous, partial  . APPENDECTOMY    . appendectomy    . BACK SURGERY     x2  . CHOLECYSTECTOMY    . CHOLECYSTECTOMY    . CYST EXCISION     on ovaries  . lumbar     ruptured disc in lumbar taken out  . ROTATOR CUFF REPAIR Left   . TONSILLECTOMY    . TUBAL LIGATION      There were no vitals filed for this visit.  Subjective Assessment - 10/02/18 1020    Subjective  Pt stated her back and lateral hips are bothering her pretty bad, current pain scale 8/10.  Difficulty sleep at night    Pertinent History  Lumbar fusions L3-4, L4-5, L5-S1, severe  depression and anxiety (pt does not feel it is well managed right now), hx of seizures (headaches and repeated yawning are signs that a seizure is going to occur, stress is a trigger)    Currently in Pain?  Yes    Pain Score  8     Pain Location  Back    Pain Orientation  Lower    Pain Descriptors / Indicators  Aching;Dull    Pain Type  Chronic pain    Pain Radiating Towards  Radiated in bil gluteus maximus and feels sharp    Pain Onset  More than a month ago    Pain Frequency  Constant    Aggravating Factors   unsure, bending down to pick up items    Pain Relieving Factors  pain medication, muscle relaxors    Effect of Pain on Daily Activities  pushes through, unable to complete some activities         [facility] PT Assessment - 10/02/18 0001      Assessment   Medical Diagnosis  Low Back Pain    Referring Provider (PT)  Kristeen Miss, MD    Onset Date/Surgical Date  05/23/18    Hand Dominance  Right    Next MD Visit  F/u with Dr. Ellene Route in September 9/10    Prior Therapy  yes before back surgeries and after      Precautions   Precautions  None    Precaution Comments  last f/u in June Dr. Ellene Route told pt she can perform activities within tolerance                   [facility] Adult PT Treatment/Exercise - 10/02/18 0001      Exercises   Exercises  Lumbar      Lumbar Exercises: Stretches   Active Hamstring Stretch  Right;Left;2 reps;30 seconds    Active Hamstring Stretch Limitations  rope assistance    Single Knee to Chest Stretch  2 reps;30 seconds    Single Knee to Chest Stretch Limitations  towel       Lumbar Exercises: Supine   Ab Set  10 reps;3 seconds    AB Set Limitations  cueing to breath    Bridge  3 seconds;5 reps      Lumbar Exercises: Sidelying   Clam  10 reps;3 seconds;Right;Left             PT Education - 10/02/18 1038    Education Details  Reviewed goals, discussed shoe purchase (pt wore pair of her daughter's [place] to session, likes the  support); educated HEP and importance with compliance.  Discussed equipment at pt.'s home- stated her mother owns treadmill and bike as well as a big ball that lives close by.  Also discussed benefits of swimming exercise- sister owns pool.    Person(s) Educated  Patient    Methods  Explanation;Demonstration    Comprehension  Verbalized understanding;Returned demonstration       PT Short Term Goals - 09/18/18 1316      PT SHORT TERM GOAL #1   Title  Patient will de independent with HEP, updated, PRN, to improme strength and functional mobility and endurance.    Period  Weeks    Status  New    Target Date  10/02/18      PT SHORT TERM GOAL #2   Title  Patient will be able to walk 10 minutes or > 3x/week for regular exercise and wellness.    Time  3    Period  Weeks    Status  New    Target Date  10/09/18        PT Long Term Goals - 09/18/18 1320      PT LONG TERM GOAL #1   Title  Patient will have no greater than 3/10 pain while performing daily/weekly household cleaning activities including vacuuming and moving furniture to do so.    Time  6    Period  Weeks    Status  New    Target Date  10/30/18      PT LONG TERM GOAL #2   Title  Patient will participate in swimming laps or exercise routine 2x/week to improve strength and overall fitness/wellness.    Time  6    Period  Weeks    Status  New    Target Date  10/30/18      PT LONG TERM GOAL #3   Title  Patient will no longer have pain at night when laying down to go to sleep to improve her ability to sleep and QOL.    Time  6    Period  Weeks  Status  New    Target Date  10/30/18            Plan - 10/02/18 1333    Clinical Impression Statement  Reviewed goals and discussed good shoe support, equipment available including treadmill, bike, big ball and swim pool owned by family members close by;  as well as importance of compliance with HEP.  Session focus on hip strengthening exercises and lumbar stretches.  Pt  able to demonstrate good form and mechanics wiht all exercises.  Established HEP and given print out.  Reports decreased pain at EOS.    Personal Factors and Comorbidities  Past/Current Experience;Comorbidity 3+    Comorbidities  HTN, PTSD, Anxiety, Depression    Examination-Activity Limitations  Lift;Locomotion Level;Stand;Squat;Stairs;Caring for Others;Bend    Examination-Participation Restrictions  Cleaning;Laundry    Stability/Clinical Decision Making  Stable/Uncomplicated    Clinical Decision Making  Low    Rehab Potential  Good    PT Frequency  2x / week    PT Duration  6 weeks    PT Treatment/Interventions  ADLs/Self Care Home Management;Aquatic Therapy;Cryotherapy;Electrical Stimulation;Iontophoresis 4mg /ml Dexamethasone;Moist Heat;Stair training;Functional mobility training;Gait training;Therapeutic activities;Therapeutic exercise;Balance training;Neuromuscular re-education;Patient/family education;Manual techniques;Scar mobilization;Passive range of motion;Taping    PT Next Visit Plan  Continue hip strengthening and lumbar stretches.  Also theraball related exercises as pt has one she can complete at home.    PT Home Exercise Plan  7/28: bridge, clam, hamstring stretch, SKTC and begin walking program       Patient will benefit from skilled therapeutic intervention in order to improve the following deficits and impairments:  Abnormal gait, Decreased activity tolerance, Decreased endurance, Decreased mobility, Decreased strength, Impaired flexibility, Increased fascial restricitons, Pain, Obesity, Postural dysfunction  Visit Diagnosis: 1. Chronic midline low back pain without sciatica   2. Muscle weakness (generalized)        Problem List Patient Active Problem List   Diagnosis Date Noted  . Encounter for neuropsychological testing   . Lumbar stenosis with neurogenic claudication 05/23/2018  . Seizures ([place])   . 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 Jerking movements of extremities  11/11/2015  . Major depressive disorder, recurrent severe without psychotic features ([place])   . Bipolar II disorder ([place]) 08/30/2015  . Suicide attempt by drug ingestion ([place]) 08/30/2015  . Suicidal ideation 08/30/2015  . Migraines 09/29/2014  . Migraine 05/19/2013  . Left-sided weakness 05/18/2013  . Numbness and tingling of left arm and leg 05/18/2013  . CVA (cerebral infarction) 05/18/2013  . Hypertension   . Anxiety    Ihor Austin, LPTA; [place]  Aldona Lento 10/02/2018, 1:42 PM  [facility] [address] [place], [place], [address] Phone: [phone]   Fax:  [phone]  Name: [name] MRN: [id number] Date of Birth: Aug 18, 1965

## 2018-10-03 DIAGNOSIS — F4312 Post-traumatic stress disorder, chronic: Secondary | ICD-10-CM | POA: Diagnosis not present

## 2018-10-04 ENCOUNTER — Encounter (HOSPITAL_COMMUNITY): Payer: Self-pay

## 2018-10-04 ENCOUNTER — Other Ambulatory Visit: Payer: Self-pay

## 2018-10-04 ENCOUNTER — Ambulatory Visit (HOSPITAL_COMMUNITY): Payer: BC Managed Care – PPO

## 2018-10-04 DIAGNOSIS — M545 Low back pain, unspecified: Secondary | ICD-10-CM

## 2018-10-04 DIAGNOSIS — M6281 Muscle weakness (generalized): Secondary | ICD-10-CM | POA: Diagnosis not present

## 2018-10-04 DIAGNOSIS — G8929 Other chronic pain: Secondary | ICD-10-CM

## 2018-10-04 NOTE — Therapy (Signed)
[place], [place], [address] Phone: [phone]   Fax:  [phone]  Physical Therapy Treatment  Patient Details  Name: [name] MRN: [id number] Date of Birth: Nov 08, 1965 Referring Provider (PT): Kristeen Miss, MD   Encounter Date: 10/04/2018  PT End of Session - 10/04/18 0935    Visit Number  3    Number of Visits  12    Date for PT Re-Evaluation  10/30/18    Authorization Type  Medicare Part A & B (BCBS secondary)    Authorization Time Period  09/18/2018-10/30/2018    Authorization - Visit Number  3    Authorization - Number of Visits  10    PT Start Time  0916    PT Stop Time  0957    PT Time Calculation (min)  41 min    Activity Tolerance  Patient tolerated treatment well    Behavior During Therapy  [facility] for tasks assessed/performed       Past Medical History:  Diagnosis Date  . Anxiety   . Depression   . Encounter for neuropsychological testing    at [facility] 3/20-suggest possible somatization  . Headache   . Hypertension   . Pseudoseizure   . PTSD (post-traumatic stress disorder)   . Rosacea   . Seizures [facility])    [facility] (Dr. 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 Sleep apnea     Past Surgical History:  Procedure Laterality Date  . ABDOMINAL HYSTERECTOMY     non cancerous, partial  . APPENDECTOMY    . appendectomy    . BACK SURGERY     x2  . CHOLECYSTECTOMY    . CHOLECYSTECTOMY    . CYST EXCISION     on ovaries  . lumbar     ruptured disc in lumbar taken out  . ROTATOR CUFF REPAIR Left   . TONSILLECTOMY    . TUBAL LIGATION      There were no vitals filed for this visit.  Subjective Assessment - 10/04/18 0916    Subjective  Patient reports she has been more sore than she was right after surgery. She reports there is no pain into her LE's but at her mid lumbar spine and out along the side above her glutes she is  having more pain. She reports she has been doing her exercises and no have been too difficult.    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  Yes    Pain Score  7     Pain Location  Back    Pain Orientation  Lower    Pain Descriptors / Indicators  Aching;Sore;Dull    Pain Type  Chronic pain    Pain Onset  More than a month ago    Pain Frequency  Constant    Pain Relieving Factors  pain medications       [facility] Adult PT Treatment/Exercise - 10/04/18 0001      Exercises   Exercises  Lumbar      Lumbar Exercises: Stretches   Lower Trunk Rotation  Limitations    Lower Trunk Rotation Limitations  10x 5 sec holds each side  Standing Extension  5 reps;5 seconds      Lumbar Exercises: Supine   Pelvic Tilt  10 reps    Pelvic Tilt Limitations  3 sec holds, TrA activation    Bent Knee Raise  10 reps;Limitations    Bent Knee Raise Limitations  post pelvic tilt for TrA activation and march    Bridge  10 reps;3 seconds    Bridge Limitations  2 sets, pt with low clearance/lift      Lumbar Exercises: Sidelying   Clam  Both;10 reps;3 seconds      Manual Therapy   Manual Therapy  Soft tissue mobilization;Myofascial release    Manual therapy comments  performed seperate from other interventions    Soft tissue mobilization  STM to lumbar paraspinal and quadratus lumborem for pain relief    Myofascial Release  cross friction massage to lumbar incision to reduce scar tissue build up        PT Education - 10/04/18 0942    Education Details  Educated on exercises and purpose of interventions throughout. Educated on progression of delayed onset muscle sorennes as we begin moving her back through new motions and stressing her hip and back muscles  to improve strength and endurance.    Person(s) Educated  Patient    Methods  Explanation;Demonstration    Comprehension  Verbalized understanding;Returned demonstration       PT Short Term Goals - 09/18/18 1316      PT SHORT TERM GOAL #1   Title  Patient will de independent with HEP, updated, PRN, to improme strength and functional mobility and endurance.    Period  Weeks    Status  New    Target Date  10/02/18      PT SHORT TERM GOAL #2   Title  Patient will be able to walk 10 minutes or > 3x/week for regular exercise and wellness.    Time  3    Period  Weeks    Status  New    Target Date  10/09/18        PT Long Term Goals - 09/18/18 1320      PT LONG TERM GOAL #1   Title  Patient will have no greater than 3/10 pain while performing daily/weekly household cleaning activities including vacuuming and moving furniture to do so.    Time  6    Period  Weeks    Status  New    Target Date  10/30/18      PT LONG TERM GOAL #2   Title  Patient will participate in swimming laps or exercise routine 2x/week to improve strength and overall fitness/wellness.    Time  6    Period  Weeks    Status  New    Target Date  10/30/18      PT LONG TERM GOAL #3   Title  Patient will no longer have pain at night when laying down to go to sleep to improve her ability to sleep and QOL.    Time  6    Period  Weeks    Status  New    Target Date  10/30/18         Plan - 10/04/18 0935    Clinical Impression Statement  Patient arrived reporting she has been experiencing more pain in her lumbar spine compared to after surgery. Time spent at start of session educating on DOMS and the progression of rehab after lumbar surgery as pt is beginning to  move her back through movements she avoided for 3+ months and this new stress will result in increased soreness. Remainder of session focused on exercise to address hip weakness and lumbar mobility and introduced LTR and lumbar extension stretch at  counter. At EOS performed soft tissue mobilization along paraspinals to address pain and scar massage to incision scars in lumbar spine to reduce adhesions. Patient reported reduction in pain from 7/10 to 6/10 at EOS. She will continue to benefit from skilled PT interventions to address impairments and improve QOL.    Personal Factors and Comorbidities  Past/Current Experience;Comorbidity 3+    Comorbidities  HTN, PTSD, Anxiety, Depression    Examination-Activity Limitations  Lift;Locomotion Level;Stand;Squat;Stairs;Caring for Others;Bend    Examination-Participation Restrictions  Cleaning;Laundry    Stability/Clinical Decision Making  Stable/Uncomplicated    Rehab Potential  Good    PT Frequency  2x / week    PT Duration  6 weeks    PT Treatment/Interventions  ADLs/Self Care Home Management;Aquatic Therapy;Cryotherapy;Electrical Stimulation;Iontophoresis 4mg /ml Dexamethasone;Moist Heat;Stair training;Functional mobility training;Gait training;Therapeutic activities;Therapeutic exercise;Balance training;Neuromuscular re-education;Patient/family education;Manual techniques;Scar mobilization;Passive range of motion;Taping    PT Next Visit Plan  Continue hip strengthening and lumbar stretches. Educate on walking program progression and follow up if pt began over weekend. Initiate floor to stand training/exercises, lunges and functional squats.  Also theraball related exercises as pt has one she can complete at home.    PT Home Exercise Plan  7/28: bridge, clam, hamstring stretch, SKTC and begin walking program    Consulted and Agree with Plan of Care  Patient       Patient will benefit from skilled therapeutic intervention in order to improve the following deficits and impairments:  Abnormal gait, Decreased activity tolerance, Decreased endurance, Decreased mobility, Decreased strength, Impaired flexibility, Increased fascial restricitons, Pain, Obesity, Postural dysfunction  Visit Diagnosis: 1.  Chronic midline low back pain without sciatica   2. Muscle weakness (generalized)        Problem List Patient Active Problem List   Diagnosis Date Noted  . Encounter for neuropsychological testing   . Lumbar stenosis with neurogenic claudication 05/23/2018  . Seizures ([place])   . 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 Jerking movements of extremities 11/11/2015  . Major depressive disorder, recurrent severe without psychotic features ([place])   . Bipolar II disorder ([place]) 08/30/2015  . Suicide attempt by drug ingestion ([place]) 08/30/2015  . Suicidal ideation 08/30/2015  . Migraines 09/29/2014  . Migraine 05/19/2013  . Left-sided weakness 05/18/2013  . Numbness and tingling of left arm and leg 05/18/2013  . CVA (cerebral infarction) 05/18/2013  . Hypertension   . Anxiety    Kipp Brood, PT, DPT, [facility] Physical Therapist with [facility]  10/04/2018 10:03 AM    [place] [address] [place], [place], [address] Phone: [phone]   Fax:  [phone]  Name: Brecklynn Jian MRN: [id number] Date of Birth: 05/11/1965

## 2018-10-09 ENCOUNTER — Other Ambulatory Visit: Payer: Self-pay

## 2018-10-09 ENCOUNTER — Encounter (HOSPITAL_COMMUNITY): Payer: Self-pay | Admitting: Physical Therapy

## 2018-10-09 ENCOUNTER — Ambulatory Visit (HOSPITAL_COMMUNITY): Payer: BC Managed Care – PPO | Attending: Neurological Surgery | Admitting: Physical Therapy

## 2018-10-09 DIAGNOSIS — M6281 Muscle weakness (generalized): Secondary | ICD-10-CM | POA: Diagnosis not present

## 2018-10-09 DIAGNOSIS — M545 Low back pain, unspecified: Secondary | ICD-10-CM

## 2018-10-09 DIAGNOSIS — G8929 Other chronic pain: Secondary | ICD-10-CM | POA: Diagnosis not present

## 2018-10-09 NOTE — Therapy (Signed)
[place], [place], [address] Phone: [phone]   Fax:  [phone]  Physical Therapy Treatment  Patient Details  Name: [name] MRN: [id number] Date of Birth: Mar 04, 1966 Referring Provider (PT): Kristeen Miss, MD   Encounter Date: 10/09/2018  PT End of Session - 10/09/18 1221    Visit Number  4    Number of Visits  12    Date for PT Re-Evaluation  10/30/18    Authorization Type  Medicare Part A & B (BCBS secondary)    Authorization Time Period  09/18/2018-10/30/2018    Authorization - Visit Number  4    Authorization - Number of Visits  10    PT Start Time  1017    PT Stop Time  1100    PT Time Calculation (min)  43 min    Activity Tolerance  Patient tolerated treatment well    Behavior During Therapy  [facility] for tasks assessed/performed       Past Medical History:  Diagnosis Date  . Anxiety   . Depression   . Encounter for neuropsychological testing    at [facility] 3/20-suggest possible somatization  . Headache   . Hypertension   . Pseudoseizure   . PTSD (post-traumatic stress disorder)   . Rosacea   . Seizures [facility])    [facility] (Dr. 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 Sleep apnea     Past Surgical History:  Procedure Laterality Date  . ABDOMINAL HYSTERECTOMY     non cancerous, partial  . APPENDECTOMY    . appendectomy    . BACK SURGERY     x2  . CHOLECYSTECTOMY    . CHOLECYSTECTOMY    . CYST EXCISION     on ovaries  . lumbar     ruptured disc in lumbar taken out  . ROTATOR CUFF REPAIR Left   . TONSILLECTOMY    . TUBAL LIGATION      There were no vitals filed for this visit.  Subjective Assessment - 10/09/18 1016    Subjective  I was hurting last time I was here. Its about a 4/10 for me, less than usual. I've started a walking program, I've only done it once so far but we are going to the beach tomorrow so I'm  thinking I'll be walking more there.    Pertinent History  Lumbar fusions L3-4, L4-5, L5-S1, severe depression and anxiety (pt does not feel it is well managed right now), hx of seizures (headaches and repeated yawning are signs that a seizure is going to occur, stress is a trigger)    Currently in Pain?  Yes    Pain Score  4     Pain Location  Back    Pain Orientation  Right;Lower    Pain Descriptors / Indicators  Aching;Dull    Pain Type  Chronic pain    Pain Radiating Towards  none                       [facility] Adult PT Treatment/Exercise - 10/09/18 0001      Lumbar Exercises: Stretches   Single Knee to Chest Stretch  5 reps;10 seconds    Single Knee to Chest Stretch Limitations  B     Lower Trunk Rotation  5 reps;10 seconds    Lower Trunk Rotation Limitations  B     Figure 4 Stretch  2  reps;30 seconds      Lumbar Exercises: Standing   Functional Squats  10 reps    Forward Lunge  10 reps    Forward Lunge Limitations  4 inch box     Side Lunge Limitations  attempted with 4 and 6 inch box, stopped due to increasing back pain     Other Standing Lumbar Exercises  3D hip excursions 1x15 all directions     Other Standing Lumbar Exercises  worked on deadlift form 0# 1x10       Lumbar Exercises: Supine   Ab Set  --    Pelvic Tilt  10 reps    Pelvic Tilt Limitations  3 sec holds, TrA activation    Bent Knee Raise  10 reps    Bent Knee Raise Limitations  post pelvic tilt     Bridge  10 reps;3 seconds    Bridge Limitations  poor clearance     Other Supine Lumbar Exercises  isometric hip extensions into mat table 1x10 B 3 second holds       Manual Therapy   Manual Therapy  Soft tissue mobilization    Manual therapy comments  performed seperate from other interventions    Soft tissue mobilization  IASTM to R lumbar paraspinals and QL              PT Education - 10/09/18 1221    Education Details  exercise form    Person(s) Educated  Patient    Methods   Explanation    Comprehension  Verbalized understanding       PT Short Term Goals - 09/18/18 1316      PT SHORT TERM GOAL #1   Title  Patient will de independent with HEP, updated, PRN, to improme strength and functional mobility and endurance.    Period  Weeks    Status  New    Target Date  10/02/18      PT SHORT TERM GOAL #2   Title  Patient will be able to walk 10 minutes or > 3x/week for regular exercise and wellness.    Time  3    Period  Weeks    Status  New    Target Date  10/09/18        PT Long Term Goals - 09/18/18 1320      PT LONG TERM GOAL #1   Title  Patient will have no greater than 3/10 pain while performing daily/weekly household cleaning activities including vacuuming and moving furniture to do so.    Time  6    Period  Weeks    Status  New    Target Date  10/30/18      PT LONG TERM GOAL #2   Title  Patient will participate in swimming laps or exercise routine 2x/week to improve strength and overall fitness/wellness.    Time  6    Period  Weeks    Status  New    Target Date  10/30/18      PT LONG TERM GOAL #3   Title  Patient will no longer have pain at night when laying down to go to sleep to improve her ability to sleep and QOL.    Time  6    Period  Weeks    Status  New    Target Date  10/30/18            Plan - 10/09/18 1221    Clinical Impression Statement  Continued with  functional stretching and strengthening today as tolerated by patient, with progressions performed as able. Continued with pain science based education and encouraged active movement through the day as well as progression of walking program. She continues to do well with skilled PT services moving forward. Will continue to require heavy skilled cues for correct performance of deadlifts and lateral lunges moving forward.    Personal Factors and Comorbidities  Past/Current Experience;Comorbidity 3+    Comorbidities  HTN, PTSD, Anxiety, Depression    Examination-Activity  Limitations  Lift;Locomotion Level;Stand;Squat;Stairs;Caring for Others;Bend    Examination-Participation Restrictions  Cleaning;Laundry    Rehab Potential  Good    PT Frequency  2x / week    PT Duration  6 weeks    PT Treatment/Interventions  ADLs/Self Care Home Management;Aquatic Therapy;Cryotherapy;Electrical Stimulation;Iontophoresis 4mg /ml Dexamethasone;Moist Heat;Stair training;Functional mobility training;Gait training;Therapeutic activities;Therapeutic exercise;Balance training;Neuromuscular re-education;Patient/family education;Manual techniques;Scar mobilization;Passive range of motion;Taping    PT Next Visit Plan  Continue hip strengthening and lumbar stretches. Continue working on squat vs deadlift form (0# load for now).  Educate on walking program progression and follow up if pt began over weekend. Initiate floor to stand training/exercises, lunges and functional squats.  Also theraball related exercises as pt has one she can complete at home.    PT Home Exercise Plan  7/28: bridge, clam, hamstring stretch, SKTC and begin walking program    Consulted and Agree with Plan of Care  Patient       Patient will benefit from skilled therapeutic intervention in order to improve the following deficits and impairments:  Abnormal gait, Decreased activity tolerance, Decreased endurance, Decreased mobility, Decreased strength, Impaired flexibility, Increased fascial restricitons, Pain, Obesity, Postural dysfunction  Visit Diagnosis: 1. Chronic midline low back pain without sciatica   2. Muscle weakness (generalized)        Problem List Patient Active Problem List   Diagnosis Date Noted  . Encounter for neuropsychological testing   . Lumbar stenosis with neurogenic claudication 05/23/2018  . Seizures ([place])   . 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 Jerking movements of extremities 11/11/2015  . Major depressive disorder, recurrent severe without psychotic features ([place])   . Bipolar II disorder  ([place]) 08/30/2015  . Suicide attempt by drug ingestion ([place]) 08/30/2015  . Suicidal ideation 08/30/2015  . Migraines 09/29/2014  . Migraine 05/19/2013  . Left-sided weakness 05/18/2013  . Numbness and tingling of left arm and leg 05/18/2013  . CVA (cerebral infarction) 05/18/2013  . Hypertension   . Anxiety     Deniece Ree PT, DPT, CBIS  Supplemental Physical Therapist [facility]    Pager [phone] Acute Rehab Office [facility] [address] [place], [place], [address] Phone: [phone]   Fax:  [phone]  Name: Priscila Bean MRN: [id number] Date of Birth: [birth year]

## 2018-10-11 ENCOUNTER — Ambulatory Visit (HOSPITAL_COMMUNITY): Payer: BC Managed Care – PPO | Admitting: Physical Therapy

## 2018-10-16 ENCOUNTER — Other Ambulatory Visit: Payer: Self-pay

## 2018-10-16 ENCOUNTER — Ambulatory Visit (HOSPITAL_COMMUNITY): Payer: BC Managed Care – PPO

## 2018-10-16 ENCOUNTER — Encounter (HOSPITAL_COMMUNITY): Payer: Self-pay

## 2018-10-16 DIAGNOSIS — M545 Low back pain, unspecified: Secondary | ICD-10-CM

## 2018-10-16 DIAGNOSIS — M6281 Muscle weakness (generalized): Secondary | ICD-10-CM

## 2018-10-16 DIAGNOSIS — G8929 Other chronic pain: Secondary | ICD-10-CM

## 2018-10-16 NOTE — Therapy (Signed)
[place], [place], [address] Phone: [phone]   Fax:  [phone]  Physical Therapy Treatment  Patient Details  Name: [name] MRN: [id number] Date of Birth: 01-18-1966 Referring Provider (PT): Kristeen Miss, MD   Encounter Date: 10/16/2018  PT End of Session - 10/16/18 1022    Visit Number  5    Number of Visits  12    Date for PT Re-Evaluation  10/30/18    Authorization Type  Medicare Part A & B (BCBS secondary)    Authorization Time Period  09/18/2018-10/30/2018    Authorization - Visit Number  5    Authorization - Number of Visits  10    PT Start Time  3016    PT Stop Time  1057    PT Time Calculation (min)  43 min    Activity Tolerance  Patient tolerated treatment well    Behavior During Therapy  [facility] for tasks assessed/performed       Past Medical History:  Diagnosis Date  . Anxiety   . Depression   . Encounter for neuropsychological testing    at [facility] 3/20-suggest possible somatization  . Headache   . Hypertension   . Pseudoseizure   . PTSD (post-traumatic stress disorder)   . Rosacea   . Seizures [facility])    [facility] (Dr. 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 Sleep apnea     Past Surgical History:  Procedure Laterality Date  . ABDOMINAL HYSTERECTOMY     non cancerous, partial  . APPENDECTOMY    . appendectomy    . BACK SURGERY     x2  . CHOLECYSTECTOMY    . CHOLECYSTECTOMY    . CYST EXCISION     on ovaries  . lumbar     ruptured disc in lumbar taken out  . ROTATOR CUFF REPAIR Left   . TONSILLECTOMY    . TUBAL LIGATION      There were no vitals filed for this visit.  Subjective Assessment - 10/16/18 1008    Subjective  Pt went to beach, reports ankles were swollen following all the walking.  Current pain scale 5/10 in mid back around shoulder blades and Bil glutes Rt > Lt.  Pt reports she has most  difficulty transitioning in bed.    Pertinent History  Lumbar fusions L3-4, L4-5, L5-S1, severe depression and anxiety (pt does not feel it is well managed right now), hx of seizures (headaches and repeated yawning are signs that a seizure is going to occur, stress is a trigger)    Currently in Pain?  Yes    Pain Score  5     Pain Location  Back    Pain Orientation  Mid;Lower;Right    Pain Descriptors / Indicators  Sore;Aching    Pain Type  Chronic pain    Pain Onset  More than a month ago    Pain Frequency  Constant    Aggravating Factors   unsure, bending down to pick up items    Pain Relieving Factors  pain medication    Effect of Pain on Daily Activities  pushes through, unable to complete some activities         [facility] - 10/16/18 0001      Assessment   Medical Diagnosis  Low Back Pain    Referring Provider (PT)  Kristeen Miss, MD    Onset Date/Surgical Date  05/23/18    Hand Dominance  Right    Next MD Visit  F/u with Dr. Ellene Route in September 9/10    Prior Therapy  yes before back surgeries and after      Precautions   Precautions  None    Precaution Comments  last f/u in June Dr. Ellene Route told pt she can perform activities within tolerance                   [facility] Adult PT Treatment/Exercise - 10/16/18 0001      Exercises   Exercises  Lumbar      Lumbar Exercises: Stretches   Figure 4 Stretch  2 reps;30 seconds    Other Lumbar Stretch Exercise  Theraband flexion      Lumbar Exercises: Standing   Functional Squats  10 reps    Functional Squats Limitations  chair tapping    Forward Lunge  10 reps    Forward Lunge Limitations  4 inch box     Side Lunge  10 reps    Side Lunge Limitations  on floor, cueing for mechanics    Other Standing Lumbar Exercises  3D hip excursions 1x15 all directions       Lumbar Exercises: Supine   Pelvic Tilt  10 reps    Pelvic Tilt Limitations  3 sec holds, TrA activation    Bent Knee Raise  10 reps    Bent Knee  Raise Limitations  post pelvic tilt     Bridge  10 reps;3 seconds    Bridge Limitations  low clearance, pt reports she feels increased lift today    Other Supine Lumbar Exercises  isometric hip extensions into mat table 1x10 B 3 second holds       Manual Therapy   Manual Therapy  Soft tissue mobilization    Manual therapy comments  performed seperate from other interventions    Soft tissue mobilization  STM to paraspinals, lumbar QL and gluts             PT Education - 10/16/18 1042    Education Details  Educated on benefits of compression stocking to assist with LE edema    Person(s) Educated  Patient    Methods  Explanation    Comprehension  Verbalized understanding       PT Short Term Goals - 09/18/18 1316      PT SHORT TERM GOAL #1   Title  Patient will de independent with HEP, updated, PRN, to improme strength and functional mobility and endurance.    Period  Weeks    Status  New    Target Date  10/02/18      PT SHORT TERM GOAL #2   Title  Patient will be able to walk 10 minutes or > 3x/week for regular exercise and wellness.    Time  3    Period  Weeks    Status  New    Target Date  10/09/18        PT Long Term Goals - 09/18/18 1320      PT LONG TERM GOAL #1   Title  Patient will have no greater than 3/10 pain while performing daily/weekly household cleaning activities including vacuuming and moving furniture to do so.    Time  6    Period  Weeks    Status  New    Target Date  10/30/18      PT LONG TERM GOAL #2   Title  Patient  will participate in swimming laps or exercise routine 2x/week to improve strength and overall fitness/wellness.    Time  6    Period  Weeks    Status  New    Target Date  10/30/18      PT LONG TERM GOAL #3   Title  Patient will no longer have pain at night when laying down to go to sleep to improve her ability to sleep and QOL.    Time  6    Period  Weeks    Status  New    Target Date  10/30/18            Plan -  10/16/18 1104    Clinical Impression Statement  Continued with established POC focusing on core and proximal strengthening.  Reviewed mechanics with squats and sidelunges for improved form and gluteal strengthening, no reports of pain with activity today.  Reviewed BLT precautions and held dead lift this session.  Added lumbar stretch with theraball with positive reports following.  Pt educated on benefits of compression garmets following reports of edema on ankles, pt given paperwork for [employer] and measurements taken for light compression stockings.  EOS with manual soft tissue mobilization to lumbar paraspinals and gluteal mm.  Reports no pain following manual.    Personal Factors and Comorbidities  Past/Current Experience;Comorbidity 3+    Comorbidities  HTN, PTSD, Anxiety, Depression    Examination-Activity Limitations  Lift;Locomotion Level;Stand;Squat;Stairs;Caring for Others;Bend    Examination-Participation Restrictions  Cleaning;Laundry    Stability/Clinical Decision Making  Stable/Uncomplicated    Clinical Decision Making  Low    Rehab Potential  Good    PT Frequency  2x / week    PT Duration  6 weeks    PT Treatment/Interventions  ADLs/Self Care Home Management;Aquatic Therapy;Cryotherapy;Electrical Stimulation;Iontophoresis 4mg /ml Dexamethasone;Moist Heat;Stair training;Functional mobility training;Gait training;Therapeutic activities;Therapeutic exercise;Balance training;Neuromuscular re-education;Patient/family education;Manual techniques;Scar mobilization;Passive range of motion;Taping    PT Next Visit Plan  Next session add core/balance activities like paloff and possible quadruped.  Continue hip strengthening and lumbar stretches. Continue working on squat vs deadlift form (0# load for now).  Educate on walking program progression and follow up if pt began over weekend. Initiate floor to stand training/exercises, lunges and functional squats.  Also theraball related exercises as pt  has one she can complete at home.    PT Home Exercise Plan  7/28: bridge, clam, hamstring stretch, SKTC and begin walking program       Patient will benefit from skilled therapeutic intervention in order to improve the following deficits and impairments:  Abnormal gait, Decreased activity tolerance, Decreased endurance, Decreased mobility, Decreased strength, Impaired flexibility, Increased fascial restricitons, Pain, Obesity, Postural dysfunction  Visit Diagnosis: 1. Chronic midline low back pain without sciatica   2. Muscle weakness (generalized)        Problem List Patient Active Problem List   Diagnosis Date Noted  . Encounter for neuropsychological testing   . Lumbar stenosis with neurogenic claudication 05/23/2018  . Seizures ([place])   . 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 Jerking movements of extremities 11/11/2015  . Major depressive disorder, recurrent severe without psychotic features ([place])   . Bipolar II disorder ([place]) 08/30/2015  . Suicide attempt by drug ingestion ([place]) 08/30/2015  . Suicidal ideation 08/30/2015  . Migraines 09/29/2014  . Migraine 05/19/2013  . Left-sided weakness 05/18/2013  . Numbness and tingling of left arm and leg 05/18/2013  . CVA (cerebral infarction) 05/18/2013  . Hypertension   . Anxiety  [address] LPTA; CBIS [phone]  Aldona Lento 10/16/2018, 1:05 PM  [place] [address] [place], [place], [address] Phone: [phone]   Fax:  [phone]  Name: [name] MRN: [id number] Date of Birth: Aug 24, 1965

## 2018-10-17 DIAGNOSIS — F4312 Post-traumatic stress disorder, chronic: Secondary | ICD-10-CM | POA: Diagnosis not present

## 2018-10-18 ENCOUNTER — Ambulatory Visit (HOSPITAL_COMMUNITY): Payer: BC Managed Care – PPO

## 2018-10-18 ENCOUNTER — Encounter (HOSPITAL_COMMUNITY): Payer: Self-pay

## 2018-10-18 ENCOUNTER — Other Ambulatory Visit: Payer: Self-pay

## 2018-10-18 DIAGNOSIS — G8929 Other chronic pain: Secondary | ICD-10-CM | POA: Diagnosis not present

## 2018-10-18 DIAGNOSIS — M6281 Muscle weakness (generalized): Secondary | ICD-10-CM | POA: Diagnosis not present

## 2018-10-18 DIAGNOSIS — M545 Low back pain: Secondary | ICD-10-CM | POA: Diagnosis not present

## 2018-10-18 NOTE — Therapy (Signed)
[place], [place], [address] Phone: [phone]   Fax:  [phone]  Physical Therapy Treatment  Patient Details  Name: [name] MRN: [id number] Date of Birth: [birth year] Referring Provider (PT): Kristeen Miss, MD   Encounter Date: 10/18/2018  PT End of Session - 10/18/18 1021    Visit Number  6    Number of Visits  12    Date for PT Re-Evaluation  10/30/18    Authorization Type  Medicare Part A & B (BCBS secondary)    Authorization Time Period  09/18/2018-10/30/2018    Authorization - Visit Number  6    Authorization - Number of Visits  10    PT Start Time  1017    PT Stop Time  1104    PT Time Calculation (min)  47 min    Activity Tolerance  Patient tolerated treatment well    Behavior During Therapy  [facility] for tasks assessed/performed       Past Medical History:  Diagnosis Date  . Anxiety   . Depression   . Encounter for neuropsychological testing    at [facility] 3/20-suggest possible somatization  . Headache   . Hypertension   . Pseudoseizure   . PTSD (post-traumatic stress disorder)   . Rosacea   . Seizures [facility])    [facility] (Dr. 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 Sleep apnea     Past Surgical History:  Procedure Laterality Date  . ABDOMINAL HYSTERECTOMY     non cancerous, partial  . APPENDECTOMY    . appendectomy    . BACK SURGERY     x2  . CHOLECYSTECTOMY    . CHOLECYSTECTOMY    . CYST EXCISION     on ovaries  . lumbar     ruptured disc in lumbar taken out  . ROTATOR CUFF REPAIR Left   . TONSILLECTOMY    . TUBAL LIGATION      There were no vitals filed for this visit.  Subjective Assessment - 10/18/18 1018    Subjective  Pt reports soreness LBP/hips, 5/10 today.  Reports she liked the stretch with therball last session, plans to get ball from mom to add this to HEP.  Has began walking program.    Pertinent  History  Lumbar fusions L3-4, L4-5, L5-S1, severe depression and anxiety (pt does not feel it is well managed right now), hx of seizures (headaches and repeated yawning are signs that a seizure is going to occur, stress is a trigger)    Currently in Pain?  Yes    Pain Score  5     Pain Location  Back    Pain Orientation  Right;Left;Lower    Pain Descriptors / Indicators  Sore    Pain Type  Chronic pain    Pain Onset  More than a month ago    Pain Frequency  Constant    Aggravating Factors   unsure, bending down to pick up items    Pain Relieving Factors  pain medication    Effect of Pain on Daily Activities  pushes through, unable to complete some activities.                       [facility] Adult PT Treatment/Exercise - 10/18/18 0001      Exercises   Exercises  Lumbar      Lumbar Exercises: Stretches   Active  Hamstring Stretch  Right;Left;30 seconds;3 reps    Active Hamstring Stretch Limitations  rope assistance    Other Lumbar Stretch Exercise  Theraball flexion 5x 10"      Lumbar Exercises: Standing   Functional Squats  10 reps    Functional Squats Limitations  chair tapping    Forward Lunge  10 reps    Forward Lunge Limitations  on floor, cueing for fom/mechanics    Other Standing Lumbar Exercises  3D hip excursions 1x15 all directions     Other Standing Lumbar Exercises  tandem stance 30", on foam x 30"; paloff 10x each directin      Lumbar Exercises: Supine   Bridge  10 reps;3 seconds    Bridge Limitations  increased height, still not at full range      Lumbar Exercises: Quadruped   Single Arm Raise  5 reps;3 seconds    Straight Leg Raise  5 reps;3 seconds      Manual Therapy   Manual Therapy  Soft tissue mobilization    Manual therapy comments  performed seperate from other interventions    Soft tissue mobilization  STM to paraspinals, lumbar QL and gluts               PT Short Term Goals - 09/18/18 1316      PT SHORT TERM GOAL #1   Title   Patient will de independent with HEP, updated, PRN, to improme strength and functional mobility and endurance.    Period  Weeks    Status  New    Target Date  10/02/18      PT SHORT TERM GOAL #2   Title  Patient will be able to walk 10 minutes or > 3x/week for regular exercise and wellness.    Time  3    Period  Weeks    Status  New    Target Date  10/09/18        PT Long Term Goals - 09/18/18 1320      PT LONG TERM GOAL #1   Title  Patient will have no greater than 3/10 pain while performing daily/weekly household cleaning activities including vacuuming and moving furniture to do so.    Time  6    Period  Weeks    Status  New    Target Date  10/30/18      PT LONG TERM GOAL #2   Title  Patient will participate in swimming laps or exercise routine 2x/week to improve strength and overall fitness/wellness.    Time  6    Period  Weeks    Status  New    Target Date  10/30/18      PT LONG TERM GOAL #3   Title  Patient will no longer have pain at night when laying down to go to sleep to improve her ability to sleep and QOL.    Time  6    Period  Weeks    Status  New    Target Date  10/30/18            Plan - 10/18/18 1220    Clinical Impression Statement  Progressed core and proximal strengthening with additional balance based and quadruped activities.  Pt able to complete with some difficulty due to NBOS and dynamic surface, min cueing for form wiht activities.  Continued wiht lumbar and hip stretches for mobility, pt reports she liked the theraball flexion based stretch last session.  Encouraged to add this to  her HEP.  EOS with manual soft tissue mobilizaiton to address restrictions lumbar paraspinals and gluteal mm.  Reports some soreness but overall reduction in pain following manual.    Personal Factors and Comorbidities  Past/Current Experience;Comorbidity 3+    Comorbidities  HTN, PTSD, Anxiety, Depression    Examination-Activity Limitations  Lift;Locomotion  Level;Stand;Squat;Stairs;Caring for Others;Bend    Examination-Participation Restrictions  Cleaning;Laundry    Stability/Clinical Decision Making  Stable/Uncomplicated    Clinical Decision Making  Low    Rehab Potential  Good    PT Frequency  2x / week    PT Duration  6 weeks    PT Treatment/Interventions  ADLs/Self Care Home Management;Aquatic Therapy;Cryotherapy;Electrical Stimulation;Iontophoresis 4mg /ml Dexamethasone;Moist Heat;Stair training;Functional mobility training;Gait training;Therapeutic activities;Therapeutic exercise;Balance training;Neuromuscular re-education;Patient/family education;Manual techniques;Scar mobilization;Passive range of motion;Taping    PT Next Visit Plan  Next session add core based theraball activities (dead bug) for isometric core strengthening and continues with hip strengthening and lumbar stretches.  Progress to functional strenghtening exercises (lunges, squats and balance activities).  Manual for pain control.  Theraball related exercises to add to HEP PRN.    PT Home Exercise Plan  7/28: bridge, clam, hamstring stretch, SKTC and begin walking program; 10/18/18: theraball lumbar flexion stretch       Patient will benefit from skilled therapeutic intervention in order to improve the following deficits and impairments:  Abnormal gait, Decreased activity tolerance, Decreased endurance, Decreased mobility, Decreased strength, Impaired flexibility, Increased fascial restricitons, Pain, Obesity, Postural dysfunction  Visit Diagnosis: 1. Muscle weakness (generalized)   2. Chronic midline low back pain without sciatica        Problem List Patient Active Problem List   Diagnosis Date Noted  . Encounter for neuropsychological testing   . Lumbar stenosis with neurogenic claudication 05/23/2018  . Seizures ([place])   . 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 Jerking movements of extremities 11/11/2015  . Major depressive disorder, recurrent severe without psychotic features  ([place])   . Bipolar II disorder ([place]) 08/30/2015  . Suicide attempt by drug ingestion ([place]) 08/30/2015  . Suicidal ideation 08/30/2015  . Migraines 09/29/2014  . Migraine 05/19/2013  . Left-sided weakness 05/18/2013  . Numbness and tingling of left arm and leg 05/18/2013  . CVA (cerebral infarction) 05/18/2013  . Hypertension   . [place] [place]; [place]  Aldona Lento 10/18/2018, 12:28 PM  [place] [address] [place], [place], [address] Phone: [phone]   Fax:  [phone]  Name: [name] MRN: [id number] Date of Birth: August 29, 1965

## 2018-10-23 ENCOUNTER — Ambulatory Visit (HOSPITAL_COMMUNITY): Payer: BC Managed Care – PPO | Admitting: Physical Therapy

## 2018-10-24 DIAGNOSIS — F4312 Post-traumatic stress disorder, chronic: Secondary | ICD-10-CM | POA: Diagnosis not present

## 2018-10-25 ENCOUNTER — Ambulatory Visit (HOSPITAL_COMMUNITY): Payer: BC Managed Care – PPO

## 2018-10-30 ENCOUNTER — Encounter (HOSPITAL_COMMUNITY): Payer: Self-pay | Admitting: Physical Therapy

## 2018-10-30 ENCOUNTER — Ambulatory Visit (HOSPITAL_COMMUNITY): Payer: BC Managed Care – PPO | Admitting: Physical Therapy

## 2018-10-30 ENCOUNTER — Other Ambulatory Visit: Payer: Self-pay

## 2018-10-30 DIAGNOSIS — M6281 Muscle weakness (generalized): Secondary | ICD-10-CM | POA: Diagnosis not present

## 2018-10-30 DIAGNOSIS — G8929 Other chronic pain: Secondary | ICD-10-CM | POA: Diagnosis not present

## 2018-10-30 DIAGNOSIS — M545 Low back pain: Secondary | ICD-10-CM | POA: Diagnosis not present

## 2018-10-30 NOTE — Patient Instructions (Addendum)
Stand to Sit / Sit to Stand    To sit: Bend knees to lower self onto front edge of chair, then scoot back on seat. To stand: Reverse sequence by placing one foot forward, and scoot to front of seat. Use rocking motion to stand up. Repeat 10 x as an exercise  Copyright  VHI. All rights reserved.  Straight Leg Raise (Prone)    Abdomen and head supported, keep left knee locked and raise leg at hip. Avoid arching low back. Repeat __10__ times per set. Do _1___ sets per session. Do 2____ sessions per day.  http://orth.exer.us/1112   Copyright  VHI. All rights reserved.  Balance: Unilateral   AT kitchen countger  Attempt to balance on left leg, eyes open. Hold _30___ seconds. Repeat __3__ times per set. Do ___1_ sets per session. Do __1__ sessions per day. Perform exercise with eyes closed.  http://orth.exer.us/28   Copyright  VHI. All rights reserved.

## 2018-10-30 NOTE — Therapy (Signed)
[place], [place], [address] Phone: [phone]   Fax:  [phone]  Physical Therapy Treatment  Patient Details  Name: [name] MRN: [id number] Date of Birth: 1965/11/18 Referring Provider (PT): Kristeen Miss, MD   Encounter Date: 10/30/2018  PT End of Session - 10/30/18 1306    Visit Number  7    Number of Visits  12    Date for PT Re-Evaluation  11/28/18    Authorization Type  Medicare Part A & B (BCBS secondary)    Authorization Time Period  10/30/2018-11/28/2018    Authorization - Visit Number  7    Authorization - Number of Visits  12    PT Start Time  6063    PT Stop Time  1100    PT Time Calculation (min)  45 min    Activity Tolerance  Patient tolerated treatment well    Behavior During Therapy  [facility] for tasks assessed/performed       Past Medical History:  Diagnosis Date  . Anxiety   . Depression   . Encounter for neuropsychological testing    at [facility] 3/20-suggest possible somatization  . Headache   . Hypertension   . Pseudoseizure   . PTSD (post-traumatic stress disorder)   . Rosacea   . Seizures [facility])    [facility] (Dr. 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 Sleep apnea     Past Surgical History:  Procedure Laterality Date  . ABDOMINAL HYSTERECTOMY     non cancerous, partial  . APPENDECTOMY    . appendectomy    . BACK SURGERY     x2  . CHOLECYSTECTOMY    . CHOLECYSTECTOMY    . CYST EXCISION     on ovaries  . lumbar     ruptured disc in lumbar taken out  . ROTATOR CUFF REPAIR Left   . TONSILLECTOMY    . TUBAL LIGATION      There were no vitals filed for this visit.  Subjective Assessment - 10/30/18 1020    Subjective  PT states that she has increased pain with bed mobility.  She states that she log rolls.    Pertinent History  Lumbar fusions L3-4, L4-5, L5-S1, severe depression and anxiety (pt does not  feel it is well managed right now), hx of seizures (headaches and repeated yawning are signs that a seizure is going to occur, stress is a trigger)    How long can you sit comfortably?  getting up is the problem not sitting    How long can you stand comfortably?  15-20 minutes    How long can you walk comfortably?  walking from her house to her mother's which takes 5-7 minutes everyday.  This is the longest that she walks.    Currently in Pain?  Yes    Pain Score  7     Pain Location  Back    Pain Orientation  Right;Left    Pain Descriptors / Indicators  Aching    Pain Type  Chronic pain    Pain Radiating Towards  buttock- 3 tl 4 times a day    Pain Onset  More than a month ago    Pain Frequency  Constant    Aggravating Factors   getting out of bed, rolling, standing for long periods of time, sit to stand    Pain Relieving Factors  not sure  [facility] PT Assessment - 10/30/18 0001      Assessment   Medical Diagnosis  Low Back Pain    Referring Provider (PT)  Kristeen Miss, MD    Onset Date/Surgical Date  05/23/18    Hand Dominance  Right    Next MD Visit  F/u with Dr. Ellene Route in September 9/10    Prior Therapy  yes before back surgeries and after      Precautions   Precautions  None    Precaution Comments  last f/u in June Dr. Ellene Route told pt she can perform activities within tolerance      Observation/Other Assessments   Focus on Therapeutic Outcomes (FOTO)   was 44 now 63% limited       Functional Tests   Functional tests  Single leg stance      Single Leg Stance   Comments  rt:  60 LT:  60      Strength   Right Hip Flexion  --   5-/5 was 4+   Right Hip Extension  3+/5   tested prone    Right Hip ABduction  4+/5   was 4/5    Left Hip Flexion  --   5-/5 was 4+   Left Hip Extension  3+/5    Left Hip ABduction  4/5   was 4/5   Right Knee Flexion  5/5   was 4+   Right Knee Extension  5/5    Left Knee Flexion  4+/5   was 4+   Left Knee Extension  5/5    Right  Ankle Dorsiflexion  5/5    Right Ankle Plantar Flexion  5/5   was 4+   Left Ankle Dorsiflexion  5/5    Left Ankle Plantar Flexion  5/5   was 4+      Ambulation/Gait   Ambulation/Gait Assistance  7: Independent    Ambulation Distance (Feet)  522 Feet  2 MWT          EXERCISE:  Rolling for bed mobility x 5 Sit to stand x 10 watching posture Prone hip extension x 10 B Hip abduction x 10  Walking working on standing tall                  PT Education - 10/30/18 1305    Education Details  The importance and benefit of walking everyday,  Why it will be important to strengthen her gluteal mm, new HEP    Person(s) Educated  Patient    Methods  Explanation;Handout    Comprehension  Verbalized understanding       PT Short Term Goals - 10/30/18 1311      PT SHORT TERM GOAL #1   Title  Patient will de independent with HEP, updated, PRN, to improme strength and functional mobility and endurance.    Period  Weeks    Status  On-going    Target Date  10/02/18      PT SHORT TERM GOAL #2   Title  Patient will be able to walk 10 minutes or > 3x/week for regular exercise and wellness.    Time  3    Period  Weeks    Status  On-going    Target Date  10/09/18        PT Long Term Goals - 10/30/18 1311      PT LONG TERM GOAL #1   Title  Patient will have no greater than 3/10 pain while performing daily/weekly household cleaning  activities including vacuuming and moving furniture to do so.    Time  6    Period  Weeks    Status  Not Met      PT LONG TERM GOAL #2   Title  Patient will participate in swimming laps or exercise routine 2x/week to improve strength and overall fitness/wellness.    Time  6    Period  Weeks    Status  Not Met      PT LONG TERM GOAL #3   Title  Patient will no longer have pain at night when laying down to go to sleep to improve her ability to sleep and QOL.    Time  6    Period  Weeks    Status  On-going            Plan -  10/30/18 1307    Clinical Impression Statement  Ms. Schey was reassessed with about half of her mm improving in strength.  Her pain level has not decreased at this time but she has not been able to come to therapy consistantly due to migranes.  The patient would like and will benefit from continuing skilled physical therapy to improve her strength, mobility and decrease her pain.    Personal Factors and Comorbidities  Past/Current Experience;Comorbidity 3+    Comorbidities  HTN, PTSD, Anxiety, Depression    Examination-Activity Limitations  Lift;Locomotion Level;Stand;Squat;Stairs;Caring for Others;Bend    Examination-Participation Restrictions  Cleaning;Laundry    Stability/Clinical Decision Making  Stable/Uncomplicated    Rehab Potential  Good    PT Frequency  2x / week    PT Duration  6 weeks    PT Treatment/Interventions  ADLs/Self Care Home Management;Aquatic Therapy;Cryotherapy;Electrical Stimulation;Iontophoresis 43m/ml Dexamethasone;Moist Heat;Stair training;Functional mobility training;Gait training;Therapeutic activities;Therapeutic exercise;Balance training;Neuromuscular re-education;Patient/family education;Manual techniques;Scar mobilization;Passive range of motion;Taping    PT Next Visit Plan  Next session add core based theraball activities (dead bug) for isometric core strengthening and continues with hip strengthening and lumbar stretches.  Progress to functional strenghtening exercises (lunges, squats and balance activities).  Manual for pain control.  Theraball related exercises to add to HEP PRN.    PT Home Exercise Plan  7/28: bridge, clam, hamstring stretch, SKTC and begin walking program; 10/18/18: theraball lumbar flexion stretch; 8/26: sit to stand, prone hip extension, encouraged walking as pt has not started this.       Patient will benefit from skilled therapeutic intervention in order to improve the following deficits and impairments:  Abnormal gait, Decreased activity  tolerance, Decreased endurance, Decreased mobility, Decreased strength, Impaired flexibility, Increased fascial restricitons, Pain, Obesity, Postural dysfunction  Visit Diagnosis: Muscle weakness (generalized) - Plan: PT plan of care cert/re-cert  Chronic midline low back pain without sciatica - Plan: PT plan of care cert/re-cert     Problem List Patient Active Problem List   Diagnosis Date Noted  . Encounter for neuropsychological testing   . Lumbar stenosis with neurogenic claudication 05/23/2018  . Seizures ([place]   . 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 Jerking movements of extremities 11/11/2015  . Major depressive disorder, recurrent severe without psychotic features ([place]   . Bipolar II disorder ([place] 08/30/2015  . Suicide attempt by drug ingestion ([place] 08/30/2015  . Suicidal ideation 08/30/2015  . Migraines 09/29/2014  . Migraine 05/19/2013  . Left-sided weakness 05/18/2013  . Numbness and tingling of left arm and leg 05/18/2013  . CVA (cerebral infarction) 05/18/2013  . Hypertension   . Anxiety     CRayetta Humphrey PT CLT  [phone] 10/30/2018, 1:14 PM  [place] [address] [place], [place], [address] Phone: [phone]   Fax:  [phone]  Name: Maevis Mumby MRN: [id number] Date of Birth: [birth year]

## 2018-10-31 ENCOUNTER — Telehealth (HOSPITAL_COMMUNITY): Payer: Self-pay | Admitting: Physical Therapy

## 2018-10-31 DIAGNOSIS — F4312 Post-traumatic stress disorder, chronic: Secondary | ICD-10-CM | POA: Diagnosis not present

## 2018-10-31 NOTE — Telephone Encounter (Signed)
L/M ask pt to change 9/16 10am to 9:15 on same date. Holding spot waiting for return phone call-need to get 90 min Lymph pt in with CR.

## 2018-11-01 ENCOUNTER — Ambulatory Visit (HOSPITAL_COMMUNITY): Payer: BC Managed Care – PPO | Admitting: Physical Therapy

## 2018-11-01 ENCOUNTER — Telehealth (HOSPITAL_COMMUNITY): Payer: Self-pay

## 2018-11-01 NOTE — Telephone Encounter (Signed)
No need to r/s CR schedule open up for the lymph pt.

## 2018-11-06 ENCOUNTER — Encounter (HOSPITAL_COMMUNITY): Payer: Self-pay

## 2018-11-06 ENCOUNTER — Ambulatory Visit (HOSPITAL_COMMUNITY): Payer: BC Managed Care – PPO | Attending: Neurological Surgery

## 2018-11-06 ENCOUNTER — Other Ambulatory Visit: Payer: Self-pay

## 2018-11-06 DIAGNOSIS — M545 Low back pain: Secondary | ICD-10-CM | POA: Insufficient documentation

## 2018-11-06 DIAGNOSIS — M6281 Muscle weakness (generalized): Secondary | ICD-10-CM | POA: Insufficient documentation

## 2018-11-06 DIAGNOSIS — G8929 Other chronic pain: Secondary | ICD-10-CM | POA: Diagnosis not present

## 2018-11-06 NOTE — Therapy (Signed)
[place], [place], [address] Phone: [phone]   Fax:  [phone]  Physical Therapy Treatment  Patient Details  Name: [name] MRN: [id number] Date of Birth: 1965-11-09 Referring Provider (PT): Kristeen Miss, MD   Encounter Date: 11/06/2018  PT End of Session - 11/06/18 1012    Visit Number  8    Number of Visits  12    Date for PT Re-Evaluation  11/28/18    Authorization Type  Medicare Part A & B (BCBS secondary)    Authorization Time Period  10/30/2018-11/28/2018    Authorization - Visit Number  8    Authorization - Number of Visits  12    PT Start Time  1006    PT Stop Time  1045    PT Time Calculation (min)  39 min    Activity Tolerance  Patient tolerated treatment well    Behavior During Therapy  [facility] for tasks assessed/performed       Past Medical History:  Diagnosis Date  . Anxiety   . Depression   . Encounter for neuropsychological testing    at [facility] 3/20-suggest possible somatization  . Headache   . Hypertension   . Pseudoseizure   . PTSD (post-traumatic stress disorder)   . Rosacea   . Seizures [facility])    [facility] (Dr. 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 Sleep apnea     Past Surgical History:  Procedure Laterality Date  . ABDOMINAL HYSTERECTOMY     non cancerous, partial  . APPENDECTOMY    . appendectomy    . BACK SURGERY     x2  . CHOLECYSTECTOMY    . CHOLECYSTECTOMY    . CYST EXCISION     on ovaries  . lumbar     ruptured disc in lumbar taken out  . ROTATOR CUFF REPAIR Left   . TONSILLECTOMY    . TUBAL LIGATION      There were no vitals filed for this visit.  Subjective Assessment - 11/06/18 1009    Subjective  Pt stated she has began the new HEP, current pain scale 4/10.  Went to [employer] over weekend, took grandson with    Pertinent History  Lumbar fusions L3-4, L4-5, L5-S1, severe depression and  anxiety (pt does not feel it is well managed right now), hx of seizures (headaches and repeated yawning are signs that a seizure is going to occur, stress is a trigger)    Currently in Pain?  Yes    Pain Score  4     Pain Location  Back    Pain Orientation  Lower;Right    Pain Descriptors / Indicators  Aching    Pain Type  Chronic pain    Pain Onset  More than a month ago    Pain Frequency  Constant    Aggravating Factors   getting out of bed, rolling, standing for long periods of time, sit to stand    Pain Relieving Factors  not sure    Effect of Pain on Daily Activities  pushes through, unable to complete some activities         [facility] PT Assessment - 11/06/18 0001      Assessment   Medical Diagnosis  Low Back Pain    Referring Provider (PT)  Kristeen Miss, MD    Onset Date/Surgical Date  05/23/18    Hand Dominance  Right  Next MD Visit  F/u with Dr. Ellene Route in September 9/10    Prior Therapy  yes before back surgeries and after      Precautions   Precautions  None    Precaution Comments  last f/u in June Dr. Ellene Route told pt she can perform activities within tolerance                   [facility] Adult PT Treatment/Exercise - 11/06/18 0001      Exercises   Exercises  Lumbar      Lumbar Exercises: Standing   Functional Squats  10 reps    Functional Squats Limitations  chair tapping    Forward Lunge  10 reps    Forward Lunge Limitations  on floor, cueing for fom/mechanics    Other Standing Lumbar Exercises  3D hip excursions 1x15 all directions; sidestep with RTB 2RT    Other Standing Lumbar Exercises  SLS BLE 60"; vector stance 3x 5" holds BLE      Lumbar Exercises: Supine   Bridge  10 reps;3 seconds    Bridge Limitations  2 sets, good clearance    Large Ball Abdominal Isometric  5 reps;5 seconds    Large Ball Oblique Isometric  10 reps;5 seconds      Lumbar Exercises: Sidelying   Hip Abduction  Both;10 reps      Lumbar Exercises: Prone   Straight Leg  Raise  10 reps;3 seconds               PT Short Term Goals - 10/30/18 1311      PT SHORT TERM GOAL #1   Title  Patient will de independent with HEP, updated, PRN, to improme strength and functional mobility and endurance.    Period  Weeks    Status  On-going    Target Date  10/02/18      PT SHORT TERM GOAL #2   Title  Patient will be able to walk 10 minutes or > 3x/week for regular exercise and wellness.    Time  3    Period  Weeks    Status  On-going    Target Date  10/09/18        PT Long Term Goals - 10/30/18 1311      PT LONG TERM GOAL #1   Title  Patient will have no greater than 3/10 pain while performing daily/weekly household cleaning activities including vacuuming and moving furniture to do so.    Time  6    Period  Weeks    Status  Not Met      PT LONG TERM GOAL #2   Title  Patient will participate in swimming laps or exercise routine 2x/week to improve strength and overall fitness/wellness.    Time  6    Period  Weeks    Status  Not Met      PT LONG TERM GOAL #3   Title  Patient will no longer have pain at night when laying down to go to sleep to improve her ability to sleep and QOL.    Time  6    Period  Weeks    Status  On-going            Plan - 11/06/18 1033    Clinical Impression Statement  Continued wiht established POC for core and hip strenthening.  Added theraball activities for isometric abdominal strengthening as well as progressed hip stability with balance activiites.  Pt demonstrated increased clearance  with bridges this session, indicating improved gluteal strengthen.  No reports of increased pain through session, does continues to have difficulty with bed mobility and rolling.    Personal Factors and Comorbidities  Past/Current Experience;Comorbidity 3+    Comorbidities  HTN, PTSD, Anxiety, Depression    Examination-Activity Limitations  Lift;Locomotion Level;Stand;Squat;Stairs;Caring for Others;Bend    Examination-Participation  Restrictions  Cleaning;Laundry    Stability/Clinical Decision Making  Stable/Uncomplicated    Clinical Decision Making  Low    Rehab Potential  Good    PT Frequency  2x / week    PT Duration  6 weeks    PT Treatment/Interventions  ADLs/Self Care Home Management;Aquatic Therapy;Cryotherapy;Electrical Stimulation;Iontophoresis 82m/ml Dexamethasone;Moist Heat;Stair training;Functional mobility training;Gait training;Therapeutic activities;Therapeutic exercise;Balance training;Neuromuscular re-education;Patient/family education;Manual techniques;Scar mobilization;Passive range of motion;Taping    PT Next Visit Plan  Continue core based theraball activities (dead bug) for isometric core strengthening and continues with hip strengthening and lumbar stretches.  Progress to functional strenghtening exercises (lunges, squats and balance activities).  Manual for pain control.  Theraball related exercises to add to HEP PRN.    PT Home Exercise Plan  7/28: bridge, clam, hamstring stretch, SKTC and begin walking program; 10/18/18: theraball lumbar flexion stretch; 8/26: sit to stand, prone hip extension, encouraged walking as pt has not started this.       Patient will benefit from skilled therapeutic intervention in order to improve the following deficits and impairments:  Abnormal gait, Decreased activity tolerance, Decreased endurance, Decreased mobility, Decreased strength, Impaired flexibility, Increased fascial restricitons, Pain, Obesity, Postural dysfunction  Visit Diagnosis: Chronic midline low back pain without sciatica  Muscle weakness (generalized)     Problem List Patient Active Problem List   Diagnosis Date Noted  . Encounter for neuropsychological testing   . Lumbar stenosis with neurogenic claudication 05/23/2018  . Seizures ([facility]   . 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 Jerking movements of extremities 11/11/2015  . Major depressive disorder, recurrent severe without psychotic features ([facility]    . Bipolar II disorder ([place] 08/30/2015  . Suicide attempt by drug ingestion ([place] 08/30/2015  . Suicidal ideation 08/30/2015  . Migraines 09/29/2014  . Migraine 05/19/2013  . Left-sided weakness 05/18/2013  . Numbness and tingling of left arm and leg 05/18/2013  . CVA (cerebral infarction) 05/18/2013  . Hypertension   . Anxiety    CIhor Austin LHenefer CLoyal CAldona Lento9/04/2018, 12:53 PM  [facility] [address]Phone: [phone]  Fax:  [phone] Name: ADoratha McswainMRN: [id number] of Birth: [birth year]

## 2018-11-07 ENCOUNTER — Telehealth (HOSPITAL_COMMUNITY): Payer: Self-pay | Admitting: Physical Therapy

## 2018-11-07 NOTE — Telephone Encounter (Signed)
pt's mom has an appt at the same time. conflicting appts.

## 2018-11-08 ENCOUNTER — Ambulatory Visit (HOSPITAL_COMMUNITY): Payer: BC Managed Care – PPO | Admitting: Physical Therapy

## 2018-11-13 ENCOUNTER — Encounter (HOSPITAL_COMMUNITY): Payer: Medicare Other

## 2018-11-13 DIAGNOSIS — M7061 Trochanteric bursitis, right hip: Secondary | ICD-10-CM | POA: Diagnosis not present

## 2018-11-13 DIAGNOSIS — Z6836 Body mass index (BMI) 36.0-36.9, adult: Secondary | ICD-10-CM | POA: Diagnosis not present

## 2018-11-13 DIAGNOSIS — M25559 Pain in unspecified hip: Secondary | ICD-10-CM | POA: Diagnosis not present

## 2018-11-13 DIAGNOSIS — I10 Essential (primary) hypertension: Secondary | ICD-10-CM | POA: Diagnosis not present

## 2018-11-13 DIAGNOSIS — M47816 Spondylosis without myelopathy or radiculopathy, lumbar region: Secondary | ICD-10-CM | POA: Diagnosis not present

## 2018-11-15 ENCOUNTER — Ambulatory Visit (HOSPITAL_COMMUNITY): Payer: BC Managed Care – PPO

## 2018-11-20 ENCOUNTER — Ambulatory Visit (HOSPITAL_COMMUNITY): Payer: BC Managed Care – PPO | Admitting: Physical Therapy

## 2018-11-20 ENCOUNTER — Telehealth (HOSPITAL_COMMUNITY): Payer: Self-pay

## 2018-11-20 NOTE — Telephone Encounter (Signed)
pt's dtr had a covid test done on sunday mom is just waiting for the results before coming in to therapy since she has been around her daughter. Mom cancelled today's appt

## 2018-11-21 DIAGNOSIS — F4312 Post-traumatic stress disorder, chronic: Secondary | ICD-10-CM | POA: Diagnosis not present

## 2018-11-28 DIAGNOSIS — F4312 Post-traumatic stress disorder, chronic: Secondary | ICD-10-CM | POA: Diagnosis not present

## 2018-11-29 ENCOUNTER — Telehealth: Payer: Self-pay | Admitting: Adult Health

## 2018-11-29 NOTE — Telephone Encounter (Signed)
Called patient regarding appointment scheduled in our office encouraged to come alone to the visit if possible, however, a support person, over age 51, may accompany her  to appointment if assistance is needed for safety or care concerns. Otherwise, support persons should remain outside until the visit is complete.  ° °We ask if you have had any exposure to anyone suspected or confirmed of having COVID-19 or if you are experiencing any of the following, to call and reschedule your appointment: fever, cough, shortness of breath, muscle pain, diarrhea, rash, vomiting, abdominal pain, red eye, weakness, bruising, bleeding, joint pain, or a severe headache.  ° °Please know we will ask you these questions or similar questions when you arrive for your appointment and again it’s how we are keeping everyone safe.   ° °Also,to keep you safe, please use the provided hand sanitizer when you enter the office. We are asking everyone in the office to wear a mask to help prevent the spread of °germs. If you have a mask of your own, please wear it to your appointment, if not, we are happy to provide one for you. ° °Thank you for understanding and your cooperation.  ° ° °[facility] Staff ° ° ° °

## 2018-12-02 ENCOUNTER — Ambulatory Visit (INDEPENDENT_AMBULATORY_CARE_PROVIDER_SITE_OTHER): Payer: BC Managed Care – PPO | Admitting: Adult Health

## 2018-12-02 ENCOUNTER — Encounter: Payer: Self-pay | Admitting: Adult Health

## 2018-12-02 ENCOUNTER — Other Ambulatory Visit: Payer: Self-pay

## 2018-12-02 VITALS — BP 125/76 | HR 76 | Ht 67.25 in | Wt 227.0 lb

## 2018-12-02 DIAGNOSIS — N951 Menopausal and female climacteric states: Secondary | ICD-10-CM | POA: Diagnosis not present

## 2018-12-02 DIAGNOSIS — N898 Other specified noninflammatory disorders of vagina: Secondary | ICD-10-CM

## 2018-12-02 DIAGNOSIS — R6882 Decreased libido: Secondary | ICD-10-CM | POA: Diagnosis not present

## 2018-12-02 DIAGNOSIS — R5383 Other fatigue: Secondary | ICD-10-CM | POA: Diagnosis not present

## 2018-12-02 DIAGNOSIS — Z87898 Personal history of other specified conditions: Secondary | ICD-10-CM

## 2018-12-02 DIAGNOSIS — Z7189 Other specified counseling: Secondary | ICD-10-CM

## 2018-12-02 LAB — POCT WET PREP (WET MOUNT): Clue Cells Wet Prep Whiff POC: NEGATIVE

## 2018-12-02 MED ORDER — ESTRADIOL 0.1 MG/24HR TD PTWK: 0.1000 mg | MEDICATED_PATCH | TRANSDERMAL | 12 refills | Status: DC

## 2018-12-02 NOTE — Progress Notes (Signed)
Patient ID: [name] [name] [name], female   DOB: [birth year], 53 y.o.   MRN: [id number] History of Present Illness: [name] [name] [name] is a 53 year old white female, married, sp hysterectomy in complaining of vaginal odor and hot flashes,no energy, can't lose weight, decrease sex drive and not sleeping well.She has history of seizures when stressed, and is not on meds. Had 2 seizures this weekend in her sleep. PCP is Dr [name] [name] [name].   Current Medications, Allergies, Past Medical History, Past Surgical History, Family History and Social History were reviewed in [employer] record.     Review of Systems: Patient denies any daily headaches, hearing loss,  blurred vision, shortness of breath, chest pain, abdominal pain, problems with bowel movements, urination, or intercourse. No joint pain or mood swings. See HPI for positives.    Physical Exam:BP 125/76 (BP Location: Right Arm, Patient Position: Sitting, Cuff Size: Normal)   Pulse 76   Ht 5' 7.25" (1.708 m)   Wt 227 lb (103 kg)   BMI 35.29 kg/m  General:  Well developed, well nourished, no acute distress Skin:  Warm and dry Neck:  Midline trachea, normal thyroid, good ROM, no lymphadenopathy Lungs; Clear to auscultation bilaterally Cardiovascular: Regular rate and rhythm Pelvic:  External genitalia is normal in appearance, no lesions.  The vagina is pale with loss of color, moisture and rugae, no odor, wet prep showed few WBCs. Urethra has no lesions or masses. The cervix and uterus are absent. No adnexal masses or tenderness noted.Bladder is non tender, no masses felt. Extremities/musculoskeletal:  No swelling or varicosities noted, no clubbing or cyanosis Psych:  No mood changes, alert and cooperative,seems happy Fall risk is low. PHQ 9 score is 11, is on Cymbalta. She denies any DVT,stroke, MI breast cancer or migraine with aura, she does have aura before seizures, seeing colored lights. Discussed benefits and risk of estrogen therapy and will  try estrogen patch.If any pain in leg, or shortness of breath go to ER. Increase foreplay, use good lubricate with sex. Increase water and decrease carbs and try to walk 30 minutes every day. Face time 30 minutes, with 50% counseling as above. Examination chaperoned by [name] [name] [name] Rash LPN.  Impression: 1. Hot flashes due to menopause   2. Vaginal odor   3. No energy   4. Decreased libido   5. Counseling for estrogen replacement therapy   6. History of seizures       Plan: Will rx estrogen patch Meds ordered this encounter  Medications  . estradiol (CLIMARA) 0.1 mg/24hr patch    Sig: Place 1 patch (0.1 mg total) onto the skin once a week.    Dispense:  4 patch    Refill:  12    Order Specific Question:   Supervising Provider    Answer:   [name] [name] [name] [[id number]]  Review handouts on menopause and HRT Follow up in 8 weeks or sooner if needed

## 2018-12-02 NOTE — Patient Instructions (Signed)
Menopause and Hormone Replacement Therapy Menopause is a normal time of life when menstrual periods stop completely and the ovaries stop producing the female hormones estrogen and progesterone. This lack of hormones can affect your health and cause undesirable symptoms. Hormone replacement therapy (HRT) can relieve some of those symptoms. What is hormone replacement therapy? HRT is the use of artificial (synthetic) hormones to replace hormones that your body has stopped producing because you have reached menopause. What are my options for HRT?  HRT may consist of the synthetic hormones estrogen and progestin, or it may consist of only estrogen (estrogen-only therapy). You and your health care provider will decide which form of HRT is best for you. If you choose to be on HRT and you have a uterus, estrogen and progestin are usually prescribed. Estrogen-only therapy is used for women who do not have a uterus. Possible options for taking HRT include:  Pills.  Patches.  Gels.  Sprays.  Vaginal cream.  Vaginal rings.  Vaginal inserts. The amount of hormone(s) that you take and how long you take the hormone(s) varies according to your health. It is important to:  Begin HRT with the lowest possible dosage.  Stop HRT as soon as your health care provider tells you to stop.  Work with your health care provider so that you feel informed and comfortable with your decisions. What are the benefits of HRT? HRT can reduce the frequency and severity of menopausal symptoms. Benefits of HRT vary according to the kind of symptoms that you have, how severe they are, and your overall health. HRT may help to improve the following symptoms of menopause:  Hot flashes and night sweats. These are sudden feelings of heat that spread over the face and body. The skin may turn red, like a blush. Night sweats are hot flashes that happen while you are sleeping or trying to sleep.  Bone loss (osteoporosis). The  body loses calcium more quickly after menopause, causing the bones to become weaker. This can increase the risk for bone breaks (fractures).  Vaginal dryness. The lining of the vagina can become thin and dry, which can cause pain during sex or cause infection, burning, or itching.  Urinary tract infections.  Urinary incontinence. This is the inability to control when you pass urine.  Irritability.  Short-term memory problems. What are the risks of HRT? Risks of HRT vary depending on your individual health and medical history. Risks of HRT also depend on whether you receive both estrogen and progestin or you receive estrogen only. HRT may increase the risk of:  Spotting. This is when a small amount of blood leaks from the vagina unexpectedly.  Endometrial cancer. This cancer is in the lining of the uterus (endometrium).  Breast cancer.  Increased density of breast tissue. This can make it harder to find breast cancer on a breast X-ray (mammogram).  Stroke.  Heart disease.  Blood clots.  Gallbladder disease.  Liver disease. Risks of HRT can increase if you have any of the following conditions:  Endometrial cancer.  Liver disease.  Heart disease.  Breast cancer.  History of blood clots.  History of stroke. Follow these instructions at home:  Take over-the-counter and prescription medicines only as told by your health care provider.  Get mammograms, pelvic exams, and medical checkups as often as told by your health care provider.  Have Pap tests done as often as told by your health care provider. A Pap test is sometimes called a Pap smear. It   is a screening test that is used to check for signs of cancer of the cervix and vagina. A Pap test can also identify the presence of infection or precancerous changes. Pap tests may be done: ? Every 3 years, starting at age 34. ? Every 5 years, starting after age 70, in combination with testing for human papillomavirus (HPV). ?  More often or less often depending on other medical conditions you have, your age, and other risk factors.  It is up to you to get the results of your Pap test. Ask your health care provider, or the department that is doing the test, when your results will be ready.  Keep all follow-up visits as told by your health care provider. This is important. Contact a health care provider if you have:  Pain or swelling in your legs.  Shortness of breath.  Chest pain.  Lumps or changes in your breasts or armpits.  Slurred speech.  Pain, burning, or bleeding when you urinate.  Unusual vaginal bleeding.  Dizziness or headaches.  Weakness or numbness in any part of your arms or legs.  Pain in your abdomen. Summary  Menopause is a normal time of life when menstrual periods stop completely and the ovaries stop producing the female hormones estrogen and progesterone.  Hormone replacement therapy (HRT) can relieve some of the symptoms of menopause.  HRT can reduce the frequency and severity of menopausal symptoms.  Risks of HRT vary depending on your individual health and medical history. This information is not intended to replace advice given to you by your health care provider. Make sure you discuss any questions you have with your health care provider. Document Released: 11/19/2002 Document Revised: 10/23/2017 Document Reviewed: 10/23/2017 Elsevier Patient Education  2020 [employer]. Menopause Menopause is the normal time of life when menstrual periods stop completely. It is usually confirmed by 12 months without a menstrual period. The transition to menopause (perimenopause) most often happens between the ages of 8 and 6. During perimenopause, hormone levels change in your body, which can cause symptoms and affect your health. Menopause may increase your risk for:  Loss of bone (osteoporosis), which causes bone breaks (fractures).  Depression.  Hardening and narrowing of the  arteries (atherosclerosis), which can cause heart attacks and strokes. What are the causes? This condition is usually caused by a natural change in hormone levels that happens as you get older. The condition may also be caused by surgery to remove both ovaries (bilateral oophorectomy). What increases the risk? This condition is more likely to start at an earlier age if you have certain medical conditions or treatments, including:  A tumor of the pituitary gland in the brain.  A disease that affects the ovaries and hormone production.  Radiation treatment for cancer.  Certain cancer treatments, such as chemotherapy or hormone (anti-estrogen) therapy.  Heavy smoking and excessive alcohol use.  Family history of early menopause. This condition is also more likely to develop earlier in women who are very thin. What are the signs or symptoms? Symptoms of this condition include:  Hot flashes.  Irregular menstrual periods.  Night sweats.  Changes in feelings about sex. This could be a decrease in sex drive or an increased comfort around your sexuality.  Vaginal dryness and thinning of the vaginal walls. This may cause painful intercourse.  Dryness of the skin and development of wrinkles.  Headaches.  Problems sleeping (insomnia).  Mood swings or irritability.  Memory problems.  Weight gain.  Hair growth  on the face and chest.  Bladder infections or problems with urinating. How is this diagnosed? This condition is diagnosed based on your medical history, a physical exam, your age, your menstrual history, and your symptoms. Hormone tests may also be done. How is this treated? In some cases, no treatment is needed. You and your health care provider should make a decision together about whether treatment is necessary. Treatment will be based on your individual condition and preferences. Treatment for this condition focuses on managing symptoms. Treatment may include:   Menopausal hormone therapy (MHT).  Medicines to treat specific symptoms or complications.  Acupuncture.  Vitamin or herbal supplements. Before starting treatment, make sure to let your health care provider know if you have a personal or family history of:  Heart disease.  Breast cancer.  Blood clots.  Diabetes.  Osteoporosis. Follow these instructions at home: Lifestyle  Do not use any products that contain nicotine or tobacco, such as cigarettes and e-cigarettes. If you need help quitting, ask your health care provider.  Get at least 30 minutes of physical activity on 5 or more days each week.  Avoid alcoholic and caffeinated beverages, as well as spicy foods. This may help prevent hot flashes.  Get 7-8 hours of sleep each night.  If you have hot flashes, try: ? Dressing in layers. ? Avoiding things that may trigger hot flashes, such as spicy food, warm places, or stress. ? Taking slow, deep breaths when a hot flash starts. ? Keeping a fan in your home and office.  Find ways to manage stress, such as deep breathing, meditation, or journaling.  Consider going to group therapy with other women who are having menopause symptoms. Ask your health care provider about recommended group therapy meetings. Eating and drinking  Eat a healthy, balanced diet that contains whole grains, lean protein, low-fat dairy, and plenty of fruits and vegetables.  Your health care provider may recommend adding more soy to your diet. Foods that contain soy include tofu, tempeh, and soy milk.  Eat plenty of foods that contain calcium and vitamin D for bone health. Items that are rich in calcium include low-fat milk, yogurt, beans, almonds, sardines, broccoli, and kale. Medicines  Take over-the-counter and prescription medicines only as told by your health care provider.  Talk with your health care provider before starting any herbal supplements. If prescribed, take vitamins and supplements as  told by your health care provider. These may include: ? Calcium. Women age 100 and older should get 1,200 mg (milligrams) of calcium every day. ? Vitamin D. Women need 600-800 International Units of vitamin D each day. ? Vitamins B12 and B6. Aim for 50 micrograms of B12 and 1.5 mg of B6 each day. General instructions  Keep track of your menstrual periods, including: ? When they occur. ? How heavy they are and how long they last. ? How much time passes between periods.  Keep track of your symptoms, noting when they start, how often you have them, and how long they last.  Use vaginal lubricants or moisturizers to help with vaginal dryness and improve comfort during sex.  Keep all follow-up visits as told by your health care provider. This is important. This includes any group therapy or counseling. Contact a health care provider if:  You are still having menstrual periods after age 31.  You have pain during sex.  You have not had a period for 12 months and you develop vaginal bleeding. Get help right away if:  You have: ? Severe depression. ? Excessive vaginal bleeding. ? Pain when you urinate. ? A fast or irregular heart beat (palpitations). ? Severe headaches. ? Abdomen (abdominal) pain or severe indigestion.  You fell and you think you have a broken bone.  You develop leg or chest pain.  You develop vision problems.  You feel a lump in your breast. Summary  Menopause is the normal time of life when menstrual periods stop completely. It is usually confirmed by 12 months without a menstrual period.  The transition to menopause (perimenopause) most often happens between the ages of 39 and 34.  Symptoms can be managed through medicines, lifestyle changes, and complementary therapies such as acupuncture.  Eat a balanced diet that is rich in nutrients to promote bone health and heart health and to manage symptoms during menopause. This information is not intended to replace  advice given to you by your health care provider. Make sure you discuss any questions you have with your health care provider. Document Released: 05/13/2003 Document Revised: 02/02/2017 Document Reviewed: 03/25/2016 Elsevier Patient Education  2020 [employer].

## 2018-12-05 DIAGNOSIS — F4312 Post-traumatic stress disorder, chronic: Secondary | ICD-10-CM | POA: Diagnosis not present

## 2018-12-09 ENCOUNTER — Other Ambulatory Visit: Payer: Self-pay | Admitting: Family Medicine

## 2018-12-09 DIAGNOSIS — Z1231 Encounter for screening mammogram for malignant neoplasm of breast: Secondary | ICD-10-CM

## 2018-12-10 ENCOUNTER — Telehealth: Payer: Self-pay | Admitting: Family Medicine

## 2018-12-10 DIAGNOSIS — N644 Mastodynia: Secondary | ICD-10-CM

## 2018-12-10 NOTE — Telephone Encounter (Signed)
Patient calling to say that she needs diagnostic order put in for breast  Per the breast center because of some pain she is having  [phone]

## 2018-12-10 NOTE — Telephone Encounter (Signed)
Ordered diagnostic mammogram

## 2018-12-13 ENCOUNTER — Other Ambulatory Visit: Payer: Self-pay | Admitting: Family Medicine

## 2018-12-13 DIAGNOSIS — N644 Mastodynia: Secondary | ICD-10-CM

## 2018-12-16 ENCOUNTER — Other Ambulatory Visit: Payer: Self-pay | Admitting: Neurological Surgery

## 2018-12-16 DIAGNOSIS — M7061 Trochanteric bursitis, right hip: Secondary | ICD-10-CM

## 2018-12-18 ENCOUNTER — Ambulatory Visit: Payer: Medicare Other

## 2018-12-18 ENCOUNTER — Other Ambulatory Visit: Payer: Self-pay

## 2018-12-18 ENCOUNTER — Ambulatory Visit
Admission: RE | Admit: 2018-12-18 | Discharge: 2018-12-18 | Disposition: A | Discharge status: Home / Self Care | Payer: Medicare Other | Source: Ambulatory Visit | Attending: Family Medicine | Admitting: Family Medicine

## 2018-12-18 DIAGNOSIS — N644 Mastodynia: Secondary | ICD-10-CM

## 2018-12-18 DIAGNOSIS — R928 Other abnormal and inconclusive findings on diagnostic imaging of breast: Secondary | ICD-10-CM | POA: Diagnosis not present

## 2018-12-19 DIAGNOSIS — F4312 Post-traumatic stress disorder, chronic: Secondary | ICD-10-CM | POA: Diagnosis not present

## 2018-12-26 DIAGNOSIS — F4312 Post-traumatic stress disorder, chronic: Secondary | ICD-10-CM | POA: Diagnosis not present

## 2019-01-02 ENCOUNTER — Ambulatory Visit
Admission: RE | Admit: 2019-01-02 | Discharge: 2019-01-02 | Disposition: A | Discharge status: Home / Self Care | Payer: Medicare Other | Source: Ambulatory Visit | Attending: Neurological Surgery | Admitting: Neurological Surgery

## 2019-01-02 ENCOUNTER — Other Ambulatory Visit: Payer: Self-pay

## 2019-01-02 DIAGNOSIS — F4312 Post-traumatic stress disorder, chronic: Secondary | ICD-10-CM | POA: Diagnosis not present

## 2019-01-02 DIAGNOSIS — M7061 Trochanteric bursitis, right hip: Secondary | ICD-10-CM

## 2019-01-02 DIAGNOSIS — M25551 Pain in right hip: Secondary | ICD-10-CM | POA: Diagnosis not present

## 2019-01-03 DIAGNOSIS — M25559 Pain in unspecified hip: Secondary | ICD-10-CM | POA: Diagnosis not present

## 2019-01-06 ENCOUNTER — Other Ambulatory Visit: Payer: Self-pay | Admitting: Neurological Surgery

## 2019-01-06 DIAGNOSIS — M25559 Pain in unspecified hip: Secondary | ICD-10-CM

## 2019-01-08 ENCOUNTER — Other Ambulatory Visit: Payer: Self-pay

## 2019-01-08 ENCOUNTER — Ambulatory Visit
Admission: RE | Admit: 2019-01-08 | Discharge: 2019-01-08 | Disposition: A | Discharge status: Home / Self Care | Payer: Medicare Other | Source: Ambulatory Visit | Attending: Neurological Surgery | Admitting: Neurological Surgery

## 2019-01-08 DIAGNOSIS — M25551 Pain in right hip: Secondary | ICD-10-CM | POA: Diagnosis not present

## 2019-01-08 DIAGNOSIS — M25559 Pain in unspecified hip: Secondary | ICD-10-CM

## 2019-01-08 MED ORDER — IOPAMIDOL (ISOVUE-M 200) INJECTION 41%
1.0000 mL | Freq: Once | INTRAMUSCULAR | Status: AC
  Administered 2019-01-08: 1 mL via INTRA_ARTICULAR

## 2019-01-08 MED ORDER — METHYLPREDNISOLONE ACETATE 40 MG/ML INJ SUSP (RADIOLOG
120.0000 mg | Freq: Once | INTRAMUSCULAR | Status: AC
  Administered 2019-01-08: 120 mg via INTRA_ARTICULAR

## 2019-01-13 ENCOUNTER — Ambulatory Visit (INDEPENDENT_AMBULATORY_CARE_PROVIDER_SITE_OTHER): Payer: BC Managed Care – PPO | Admitting: Family Medicine

## 2019-01-13 ENCOUNTER — Other Ambulatory Visit: Payer: Self-pay

## 2019-01-13 DIAGNOSIS — R0600 Dyspnea, unspecified: Secondary | ICD-10-CM | POA: Diagnosis not present

## 2019-01-13 DIAGNOSIS — R079 Chest pain, unspecified: Secondary | ICD-10-CM

## 2019-01-13 DIAGNOSIS — F419 Anxiety disorder, unspecified: Secondary | ICD-10-CM

## 2019-01-13 DIAGNOSIS — R0609 Other forms of dyspnea: Secondary | ICD-10-CM

## 2019-01-13 MED ORDER — DIAZEPAM 5 MG PO TABS: 5.0000 mg | ORAL_TABLET | Freq: Two times a day (BID) | ORAL | 0 refills | Status: DC | PRN

## 2019-01-13 NOTE — Progress Notes (Signed)
Subjective:    Patient ID: [name], female    DOB: 06-Jul-1965, 53 y.o.   MRN: [id number]  HPI  Patient is being seen today as a telephone visit.  Phone call began at 1054.  Phone call concluded at 1106.  Patient consents to be seen via telephone.  She states that over the last week, she has developed a pressure in the middle of her chest.  Is worse with stress particularly when she is arguing with her grandson about online school.  However she is also developed increasing shortness of breath.  She reports shortness of breath with activity.  She reports a dull pressure-like sensation in the center of her chest with exercise.  However she feels this is most likely anxiety and stress induced.  She denies any chest pain other than the pressure.  She denies any orthopnea.  She denies any paroxysmal nocturnal dyspnea.  She denies any peripheral edema.  She denies any pleurisy, cough, hemoptysis.  She denies any indigestion.  She denies any relationship of the pain with food.  S medical history is significant for depression and anxiety.  She sees a Teacher, music at [employer].  She is on Cymbalta as well as Seroquel.  She also has a history of seizure disorder although there is also the possibility of pseudoseizures.  I include this in her history simply to demonstrate that stress and anxiety can potentially cause physical symptoms in this patient.  She herself endorses being under more stress and feeling anxious.  She herself believes this may be stress related. Past Medical History:  Diagnosis Date  . Anxiety   . Depression   . Encounter for neuropsychological testing    at [facility] 3/20-suggest possible somatization  . Headache   . Hypertension   . Migraines   . Pseudoseizure   . PTSD (post-traumatic stress disorder)   . Rosacea   . Seizures [facility])    [facility] (Dr. 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 Sleep apnea     Past  Surgical History:  Procedure Laterality Date  . ABDOMINAL HYSTERECTOMY     non cancerous, partial  . APPENDECTOMY    . appendectomy    . BACK SURGERY     x2  . CHOLECYSTECTOMY    . CHOLECYSTECTOMY    . CYST EXCISION     on ovaries  . lumbar     ruptured disc in lumbar taken out  . ROTATOR CUFF REPAIR Left   . TONSILLECTOMY    . TUBAL LIGATION     Current Outpatient Medications on File Prior to Visit  Medication Sig Dispense Refill  . amLODipine (NORVASC) 5 MG tablet Take 1 tablet (5 mg total) by mouth daily. 90 tablet 3  . DULoxetine (CYMBALTA) 60 MG capsule Take 120 mg by mouth daily.    Marland Kitchen estradiol (CLIMARA) 0.1 mg/24hr patch Place 1 patch (0.1 mg total) onto the skin once a week. 4 patch 12  . hydrochlorothiazide (HYDRODIURIL) 25 MG tablet Take 1 tablet (25 mg total) by mouth daily. 90 tablet 3  . mirtazapine (REMERON) 15 MG tablet Take 15 mg by mouth at bedtime.   1  . QUEtiapine (SEROQUEL) 25 MG tablet Take 25 mg by mouth at bedtime.    . Cyanocobalamin (B-12 PO) Take 1 tablet by mouth daily as needed (fatigue).     . hydrOXYzine (ATARAX/VISTARIL) 50 MG tablet Take 1 tablet (50 mg total) by mouth every  4 (four) hours as needed for itching. (Patient not taking: Reported on 12/02/2018) 90 tablet 0  . methocarbamol (ROBAXIN) 500 MG tablet Take 1 tablet (500 mg total) by mouth every 6 (six) hours as needed for muscle spasms. (Patient not taking: Reported on 01/13/2019) 40 tablet 3  . oxyCODONE-acetaminophen (PERCOCET) 10-325 MG tablet Take 1-2 tablets by mouth every 4 (four) hours as needed for pain. (Patient not taking: Reported on 01/13/2019) 60 tablet 0  . promethazine (PHENERGAN) 25 MG tablet Take 1 tablet (25 mg total) by mouth every 8 (eight) hours as needed for nausea or vomiting. (Patient not taking: Reported on 01/13/2019) 15 tablet 0   No current facility-administered medications on file prior to visit.    Allergies  Allergen Reactions  . Penicillins Hives    Has patient  had a PCN reaction causing immediate rash, facial/tongue/throat swelling, SOB or lightheadedness with hypotension: Yes Has patient had a PCN reaction causing severe rash involving mucus membranes or skin necrosis: No Has patient had a PCN reaction that required hospitalization No Has patient had a PCN reaction occurring within the last 10 years: No If all of the above answers are "NO", then may proceed with Cephalosporin use.    . Toradol [Ketorolac Tromethamine] Hives  . Lamotrigine Rash  . Zofran [Ondansetron Hcl] Itching   Social History   Socioeconomic History  . Marital status: Married    Spouse name: Not on file  . Number of children: Not on file  . Years of education: Not on file  . Highest education level: Not on file  Occupational History  . Not on file  Social Needs  . Financial resource strain: Not on file  . Food insecurity    Worry: Not on file    Inability: Not on file  . Transportation needs    Medical: Not on file    Non-medical: Not on file  Tobacco Use  . Smoking status: Former Smoker    Packs/day: 0.50    Years: 10.00    Pack years: 5.00    Types: Cigarettes    Quit date: 05/15/1991    Years since quitting: 27.6  . Smokeless tobacco: Never Used  Substance and Sexual Activity  . Alcohol use: No    Alcohol/week: 0.0 standard drinks    Comment: occassionally  . Drug use: No  . Sexual activity: Yes    Birth control/protection: Surgical  Lifestyle  . Physical activity    Days per week: Not on file    Minutes per session: Not on file  . Stress: Not on file  Relationships  . Social Herbalist on phone: Not on file    Gets together: Not on file    Attends religious service: Not on file    Active member of club or organization: Not on file    Attends meetings of clubs or organizations: Not on file    Relationship status: Not on file  . Intimate partner violence    Fear of current or ex partner: Not on file    Emotionally abused: Not on  file    Physically abused: Not on file    Forced sexual activity: Not on file  Other Topics Concern  . Not on file  Social History Narrative  . Not on file       Review of Systems  All other systems reviewed and are negative.      Objective:   Physical Exam   Physical exam cannot  be performed today as the patient was seen as a telephone visit.  Telephone visit was due in part to the fact that I have been quarantined due to symptoms of COVID-19.  Therefore I am not participating in direct patient care until the test results return negative     Assessment & Plan:  Anxiety - Plan: diazepam (VALIUM) 5 MG tablet  Exertional chest pain  Dyspnea on exertion  Patient's dyspnea on exertion and exertional chest pressure are concerning for cardiac cause based on history.  Patient's risk factors include hypertension and age greater than 22.  However based on the patient's own intuition regarding possible stress as a potential trigger, as well as her past medical history, I believe the patient's symptoms are more likely anxiety related and somatization.  Therefore we will try Valium 5 mg p.o. every 8 hours as needed anxiety.  If the anxiety improves the chest pressure and shortness of breath, it would give credence to the possibility that this is anxiety induced.  If the pressure and shortness of breath worsen despite taking Valium I have instructed her to go to the emergency room immediately.  I also encouraged her to go the emergency room immediately if the pain worsens or if the shortness of breath worsens.  Meanwhile we will schedule the patient to see cardiology for a stress test.

## 2019-01-14 DIAGNOSIS — G43019 Migraine without aura, intractable, without status migrainosus: Secondary | ICD-10-CM | POA: Diagnosis not present

## 2019-01-16 DIAGNOSIS — F4312 Post-traumatic stress disorder, chronic: Secondary | ICD-10-CM | POA: Diagnosis not present

## 2019-01-17 ENCOUNTER — Emergency Department (HOSPITAL_COMMUNITY): Payer: BC Managed Care – PPO

## 2019-01-17 ENCOUNTER — Encounter (HOSPITAL_COMMUNITY): Payer: Self-pay | Admitting: Emergency Medicine

## 2019-01-17 ENCOUNTER — Other Ambulatory Visit: Payer: Self-pay

## 2019-01-17 ENCOUNTER — Emergency Department (HOSPITAL_COMMUNITY)
Admission: EM | Admit: 2019-01-17 | Discharge: 2019-01-18 | Disposition: A | Discharge status: Home / Self Care | Payer: BC Managed Care – PPO | Attending: Emergency Medicine | Admitting: Emergency Medicine

## 2019-01-17 DIAGNOSIS — Z87891 Personal history of nicotine dependence: Secondary | ICD-10-CM | POA: Diagnosis not present

## 2019-01-17 DIAGNOSIS — I10 Essential (primary) hypertension: Secondary | ICD-10-CM | POA: Diagnosis not present

## 2019-01-17 DIAGNOSIS — Z79899 Other long term (current) drug therapy: Secondary | ICD-10-CM | POA: Diagnosis not present

## 2019-01-17 DIAGNOSIS — R0781 Pleurodynia: Secondary | ICD-10-CM | POA: Insufficient documentation

## 2019-01-17 DIAGNOSIS — R079 Chest pain, unspecified: Secondary | ICD-10-CM | POA: Diagnosis not present

## 2019-01-17 DIAGNOSIS — R0789 Other chest pain: Secondary | ICD-10-CM | POA: Diagnosis not present

## 2019-01-17 DIAGNOSIS — Z20828 Contact with and (suspected) exposure to other viral communicable diseases: Secondary | ICD-10-CM | POA: Insufficient documentation

## 2019-01-17 DIAGNOSIS — R0602 Shortness of breath: Secondary | ICD-10-CM | POA: Diagnosis not present

## 2019-01-17 LAB — CBC
HCT: 49.8 % — ABNORMAL HIGH (ref 36.0–46.0)
Hemoglobin: 15.8 g/dL — ABNORMAL HIGH (ref 12.0–15.0)
MCH: 28.9 pg (ref 26.0–34.0)
MCHC: 31.7 g/dL (ref 30.0–36.0)
MCV: 91.2 fL (ref 80.0–100.0)
Platelets: 310 10*3/uL (ref 150–400)
RBC: 5.46 MIL/uL — ABNORMAL HIGH (ref 3.87–5.11)
RDW: 13.9 % (ref 11.5–15.5)
WBC: 14 10*3/uL — ABNORMAL HIGH (ref 4.0–10.5)
nRBC: 0 % (ref 0.0–0.2)

## 2019-01-17 LAB — BASIC METABOLIC PANEL
Anion gap: 9 (ref 5–15)
BUN: 9 mg/dL (ref 6–20)
CO2: 26 mmol/L (ref 22–32)
Calcium: 9.1 mg/dL (ref 8.9–10.3)
Chloride: 102 mmol/L (ref 98–111)
Creatinine, Ser: 0.6 mg/dL (ref 0.44–1.00)
GFR calc Af Amer: >60 mL/min (ref >60)
GFR calc non Af Amer: >60 mL/min (ref >60)
Glucose, Bld: 118 mg/dL — ABNORMAL HIGH (ref 70–99)
Potassium: 3.7 mmol/L (ref 3.5–5.1)
Sodium: 137 mmol/L (ref 135–145)

## 2019-01-17 LAB — TROPONIN I (HIGH SENSITIVITY)
Troponin I (High Sensitivity): 2 ng/L (ref <18)
Troponin I (High Sensitivity): 2 ng/L (ref <18)

## 2019-01-17 LAB — POC URINE PREG, ED: Preg Test, Ur: NEGATIVE

## 2019-01-17 MED ORDER — DEXAMETHASONE SODIUM PHOSPHATE 10 MG/ML IJ SOLN
10.0000 mg | Freq: Once | INTRAMUSCULAR | Status: AC
  Administered 2019-01-17: 10 mg via INTRAVENOUS
  Filled 2019-01-17: qty 1

## 2019-01-17 MED ORDER — IOHEXOL 300 MG/ML  SOLN
100.0000 mL | Freq: Once | INTRAMUSCULAR | Status: AC | PRN
  Administered 2019-01-18: 100 mL via INTRAVENOUS

## 2019-01-17 NOTE — ED Triage Notes (Signed)
Pt c/o persistent chest pain x 1 week. Pt states she has also had lost of taste, fatigue, SOB, and headache x 2 days.

## 2019-01-18 DIAGNOSIS — R079 Chest pain, unspecified: Secondary | ICD-10-CM | POA: Diagnosis not present

## 2019-01-18 LAB — SARS CORONAVIRUS 2 (TAT 6-24 HRS): SARS Coronavirus 2: NEGATIVE

## 2019-01-18 MED ORDER — PREDNISONE 10 MG PO TABS: ORAL_TABLET | ORAL | 0 refills | Status: DC

## 2019-01-18 NOTE — Discharge Instructions (Addendum)
Your labs and CT scan are reassuring tonight.  Your Covid test is currently pending and you should remain at home until you have this result (and it is negative - this should result today). Take your next dose of prednisone tomorrow evening.  Rest and make sure you are drinking plenty of fluids. Follow up with your MD for any persistent or worsened symptoms.

## 2019-01-18 NOTE — ED Notes (Signed)
Ambulate pt  o2 stable at 96-98. Pt did complain of being a little light headed

## 2019-01-18 NOTE — ED Provider Notes (Signed)
[facility] EMERGENCY DEPARTMENT Provider Note   CSN: [id number] Arrival date & time: 01/17/19  1831     History   Chief Complaint Chief Complaint  Patient presents with  . Chest Pain    HPI [name] is a 53 y.o. female with a history of hypertension, seizure disorder, sleep apnea, and anxiety presenting with a 1 week history of shortness of breath and chest pain which has been persistent and without radiation.  She describes a midsternal pressure sensation worsened by deep inspiration and has noticed simple household chores such as vacuuming causes shortness of breath.  She denies any fevers or chills, she does endorse fatigue, generalized headache and loss of taste and smell the past several days.  She has had no known exposures to Covid 19.  She denies nausea or vomiting, has no abdominal pain no neck pain or stiffness.  No vision changes. She denies orthopnea, has had no peripheral edema. She has had a mild nonproductive cough.  Also no dysuria or decreased urinary frequency.  She has been able to maintain fluid intake but has had reduced appetite.  She was seen by her PCP earlier in the week as she has had persistent borderline tachycardia stating her heart rate has remained in the upper 90s and low 100s whenever she has measured it.  It was felt it may be anxiety induced and she was placed on Valium which has not improved her symptoms.  HPI: A 53 year old patient with a history of hypertension and obesity presents for evaluation of chest pain. Initial onset of pain was more than 6 hours ago. The patient's chest pain is sharp and is not worse with exertion. The patient's chest pain is middle- or left-sided, is not well-localized, is not described as heaviness/pressure/tightness and does not radiate to the arms/jaw/neck. The patient does not complain of nausea and denies diaphoresis. The patient has no history of stroke, has no history of peripheral artery disease, has not smoked in  the past 90 days, denies any history of treated diabetes, has no relevant family history of coronary artery disease (first degree relative at less than age 61) and has no history of hypercholesterolemia.   The history is provided by the patient.    Past Medical History:  Diagnosis Date  . Anxiety   . Depression   . Encounter for neuropsychological testing    at [facility] 3/20-suggest possible somatization  . Headache   . Hypertension   . Migraines   . Pseudoseizure   . PTSD (post-traumatic stress disorder)   . Rosacea   . Seizures [facility])    [facility] (Dr. 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 Sleep apnea     Patient Active Problem List   Diagnosis Date Noted  . Vaginal odor 12/02/2018  . Hot flashes due to menopause 12/02/2018  . No energy 12/02/2018  . Decreased libido 12/02/2018  . Counseling for estrogen replacement therapy 12/02/2018  . History of seizures 12/02/2018  . Encounter for neuropsychological testing   . Lumbar stenosis with neurogenic claudication 05/23/2018  . Seizures ([place])   . 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 Jerking movements of extremities 11/11/2015  . Major depressive disorder, recurrent severe without psychotic features ([place])   . Bipolar II disorder ([place]) 08/30/2015  . Suicide attempt by drug ingestion ([place]) 08/30/2015  . Suicidal ideation 08/30/2015  . Migraines 09/29/2014  . Migraine 05/19/2013  . Left-sided weakness 05/18/2013  . Numbness and tingling  of left arm and leg 05/18/2013  . CVA (cerebral infarction) 05/18/2013  . Hypertension   . Anxiety     Past Surgical History:  Procedure Laterality Date  . ABDOMINAL HYSTERECTOMY     non cancerous, partial  . APPENDECTOMY    . appendectomy    . BACK SURGERY     x2  . CHOLECYSTECTOMY    . CHOLECYSTECTOMY    . CYST EXCISION     on ovaries  . lumbar     ruptured disc in lumbar taken out  . ROTATOR CUFF REPAIR Left   .  TONSILLECTOMY    . TUBAL LIGATION       OB History    Gravida  2   Para  2   Term      Preterm      AB      Living  2     SAB      TAB      Ectopic      Multiple      Live Births               Home Medications    Prior to Admission medications   Medication Sig Start Date End Date Taking? Authorizing Provider  amLODipine (NORVASC) 5 MG tablet Take 1 tablet (5 mg total) by mouth daily. 03/01/18  Yes [name], MD  diazepam (VALIUM) 5 MG tablet Take 1 tablet (5 mg total) by mouth every 12 (twelve) hours as needed for anxiety. 01/13/19  Yes [name], MD  DULoxetine (CYMBALTA) 60 MG capsule Take 120 mg by mouth daily. 04/22/18  Yes [provider]  DULoxetine (CYMBALTA) 60 MG capsule Take by mouth. 12/11/18 12/11/19 Yes [provider]  estradiol (CLIMARA) 0.1 mg/24hr patch Place 1 patch (0.1 mg total) onto the skin once a week. 12/02/18  Yes Derrek Monaco A, NP  hydrochlorothiazide (HYDRODIURIL) 25 MG tablet Take 1 tablet (25 mg total) by mouth daily. 11/26/17  Yes [name], MD  HYDROcodone-acetaminophen (NORCO/VICODIN) 5-325 MG tablet Take 1 tablet by mouth every 6 (six) hours as needed. for pain 07/22/18  Yes [provider]  hydrOXYzine (ATARAX/VISTARIL) 50 MG tablet Take 1 tablet (50 mg total) by mouth every 4 (four) hours as needed for itching. 09/03/15  Yes Pucilowska, Jolanta B, MD  meloxicam (MOBIC) 15 MG tablet  07/25/18  Yes [provider]  methocarbamol (ROBAXIN) 500 MG tablet Take 1 tablet (500 mg total) by mouth every 6 (six) hours as needed for muscle spasms. 05/26/18  Yes Kristeen Miss, MD  mirtazapine (REMERON) 15 MG tablet Take 15 mg by mouth at bedtime.  12/12/17  Yes [provider]  mirtazapine (REMERON) 15 MG tablet Take by mouth. 12/11/18 12/11/19 Yes [provider]  oxyCODONE-acetaminophen (PERCOCET) 10-325 MG tablet Take 1-2 tablets by mouth every 4 (four) hours as needed for  pain. 05/26/18  Yes Kristeen Miss, MD  QUEtiapine (SEROQUEL) 25 MG tablet Take 25 mg by mouth at bedtime.   Yes [provider]  QUEtiapine (SEROQUEL) 25 MG tablet Take by mouth. 12/11/18 03/11/19 Yes [provider]  Cyanocobalamin (B-12 PO) Take 1 tablet by mouth daily as needed (fatigue).     [provider]  diazepam (VALIUM) 10 MG tablet TAKE 1 TABLET BY MOUTH 30 MINUTES PRIOR TO MRI. MUST HAVE DRIVER [id number]   [provider]  predniSONE (DELTASONE) 10 MG tablet Take 6 tablets day one, 5 tablets day two, 4 tablets  day three, 3 tablets day four, 2 tablets day five, then 1 tablet day six 01/18/19   Makenli Derstine, Almyra Free, PA-C  promethazine (PHENERGAN) 25 MG tablet Take 1 tablet (25 mg total) by mouth every 8 (eight) hours as needed for nausea or vomiting. Patient not taking: Reported on 01/13/2019 08/06/18   Sherwood Gambler, MD  UBRELVY 50 MG TABS  01/17/19   [provider]    Family History Family History  Problem Relation Age of Onset  . Diabetes Father   . Heart disease Father 37  . Hypertension Father   . Cancer Maternal Grandfather        colon cancer (70's)  . Diabetes Paternal Grandmother   . Diabetes Paternal Grandfather   . Cancer Cousin        breast  . Breast cancer Cousin   . Breast cancer Paternal Aunt   . Breast cancer Cousin   . Breast cancer Cousin     Social History Social History   Tobacco Use  . Smoking status: Former Smoker    Packs/day: 0.50    Years: 10.00    Pack years: 5.00    Types: Cigarettes    Quit date: 05/15/1991    Years since quitting: 27.6  . Smokeless tobacco: Never Used  Substance Use Topics  . Alcohol use: No    Alcohol/week: 0.0 standard drinks    Comment: occassionally  . Drug use: No     Allergies   Penicillins, Toradol [ketorolac tromethamine], Lamotrigine, and Zofran [ondansetron hcl]   Review of Systems Review of Systems  Constitutional: Positive for fatigue. Negative for chills and  fever.  HENT: Negative for congestion and sore throat.   Eyes: Negative.   Respiratory: Positive for cough and shortness of breath. Negative for chest tightness.   Cardiovascular: Positive for chest pain.  Gastrointestinal: Negative for abdominal pain, nausea and vomiting.  Genitourinary: Negative.   Musculoskeletal: Negative for arthralgias, joint swelling and neck pain.  Skin: Negative.  Negative for rash and wound.  Neurological: Negative for dizziness, weakness, light-headedness, numbness and headaches.  Psychiatric/Behavioral: Negative.      Physical Exam Updated Vital Signs BP 134/84   Pulse (!) 113   Temp 98.3 F (36.8 C)   Resp 15   Ht 5\' 7"  (1.702 m)   Wt 104.3 kg   SpO2 97%   BMI 36.02 kg/m   Physical Exam Vitals signs and nursing note reviewed.  Constitutional:      Appearance: She is well-developed.  HENT:     Head: Normocephalic and atraumatic.  Eyes:     Conjunctiva/sclera: Conjunctivae normal.  Neck:     Musculoskeletal: Normal range of motion.  Cardiovascular:     Rate and Rhythm: Regular rhythm. Tachycardia present.     Heart sounds: Normal heart sounds.  Pulmonary:     Effort: Pulmonary effort is normal.     Breath sounds: Normal breath sounds. No decreased breath sounds, wheezing, rhonchi or rales.  Abdominal:     General: Bowel sounds are normal.     Palpations: Abdomen is soft.     Tenderness: There is no abdominal tenderness.  Musculoskeletal: Normal range of motion.     Right lower leg: She exhibits no tenderness. No edema.     Left lower leg: She exhibits no tenderness. No edema.  Skin:    General: Skin is warm and dry.  Neurological:     Mental Status: She is alert.      ED Treatments / Results  Labs (all labs ordered are listed, but only abnormal results are displayed) Labs Reviewed  BASIC METABOLIC PANEL - Abnormal; Notable for the following components:      Result Value   Glucose, Bld 118 (*)    All other components within  normal limits  CBC - Abnormal; Notable for the following components:   WBC 14.0 (*)    RBC 5.46 (*)    Hemoglobin 15.8 (*)    HCT 49.8 (*)    All other components within normal limits  SARS CORONAVIRUS 2 (TAT 6-24 HRS)  POC URINE PREG, ED  TROPONIN I (HIGH SENSITIVITY)  TROPONIN I (HIGH SENSITIVITY)    EKG ED ECG REPORT   Date: 01/18/2019  Rate: 123  Rhythm: sinus tachycardia  QRS Axis: normal  Intervals: normal  ST/T Wave abnormalities: normal  Conduction Disutrbances:none  Narrative Interpretation: unchanged from prior except for rate  Old EKG Reviewed: changes noted  I have personally reviewed the EKG tracing and agree with the computerized printout as noted.   Radiology Dg Chest 2 View  Result Date: 01/17/2019 CLINICAL DATA:  Chest pain, fatigue, shortness of breath EXAM: CHEST - 2 VIEW COMPARISON:  08/27/2017 FINDINGS: Heart and mediastinal contours are within normal limits. No focal opacities or effusions. No acute bony abnormality. IMPRESSION: No active cardiopulmonary disease. Electronically Signed   By: Rolm Baptise M.D.   On: 01/17/2019 21:21   Ct Angio Chest Pe W And/or Wo Contrast  Result Date: 01/18/2019 CLINICAL DATA:  Chest pain EXAM: CT ANGIOGRAPHY CHEST WITH CONTRAST TECHNIQUE: Multidetector CT imaging of the chest was performed using the standard protocol during bolus administration of intravenous contrast. Multiplanar CT image reconstructions and MIPs were obtained to evaluate the vascular anatomy. CONTRAST:  132mL OMNIPAQUE IOHEXOL 300 MG/ML  SOLN COMPARISON:  None. FINDINGS: Cardiovascular: No filling defects in the pulmonary arteries to suggest pulmonary emboli. Heart is normal size. Aorta is normal caliber. Mediastinum/Nodes: No mediastinal, hilar, or axillary adenopathy. Lungs/Pleura: Lungs are clear. No focal airspace opacities or suspicious nodules. No effusions. Upper Abdomen: Imaging into the upper abdomen shows no acute findings. Musculoskeletal:  Chest wall soft tissues are unremarkable. No acute bony abnormality. Review of the MIP images confirms the above findings. IMPRESSION: No evidence of pulmonary embolus. No acute cardiopulmonary disease. Electronically Signed   By: Rolm Baptise M.D.   On: 01/18/2019 00:42    Procedures Procedures (including critical care time)  Medications Ordered in ED Medications  dexamethasone (DECADRON) injection 10 mg (10 mg Intravenous Given 01/17/19 2357)  iohexol (OMNIPAQUE) 300 MG/ML solution 100 mL (100 mLs Intravenous Contrast Given 01/18/19 0039)     Initial Impression / Assessment and Plan / ED Course  I have reviewed the triage vital signs and the nursing notes.  Pertinent labs & imaging results that were available during my care of the patient were reviewed by me and considered in my medical decision making (see chart for details).     HEAR Score: 2  Pt with sob with tachycardia and persistent localized pleuritic chest pain.  Labs and imaging reviewed,  Delta trops negative, heart score 2, doubt ACS,  ekg sinus tachy. CT negative for PE.  She was Covid tested here, currently pending. She was placed on prednisone for tx of suspected pleurisy.  Discussed return precautions, also recommended home quarantine until Covid is resulted and negative.   [name] was evaluated in Emergency Department on 01/18/2019 for the symptoms described in the history of present illness. She was  evaluated in the context of the global COVID-19 pandemic, which necessitated consideration that the patient might be at risk for infection with the SARS-CoV-2 virus that causes COVID-19. Institutional protocols and algorithms that pertain to the evaluation of patients at risk for COVID-19 are in a state of rapid change based on information released by regulatory bodies including the CDC and federal and state organizations. These policies and algorithms were followed during the patient's care in the ED.   Final  Clinical Impressions(s) / ED Diagnoses   Final diagnoses:  Pleuritic chest pain    ED Discharge Orders         Ordered    predniSONE (DELTASONE) 10 MG tablet     01/18/19 0223           Evalee Jefferson, PA-C 01/18/19 0231    [name] Ferguson, MD 01/18/19 [phone]

## 2019-01-20 ENCOUNTER — Other Ambulatory Visit: Payer: Self-pay | Admitting: Adult Health

## 2019-01-20 MED ORDER — FLUCONAZOLE 150 MG PO TABS: ORAL_TABLET | ORAL | 1 refills | Status: DC

## 2019-01-20 NOTE — Progress Notes (Signed)
rx diflucan  

## 2019-01-22 DIAGNOSIS — F419 Anxiety disorder, unspecified: Secondary | ICD-10-CM | POA: Diagnosis not present

## 2019-01-23 DIAGNOSIS — F4312 Post-traumatic stress disorder, chronic: Secondary | ICD-10-CM | POA: Diagnosis not present

## 2019-01-27 ENCOUNTER — Encounter: Payer: Self-pay | Admitting: Adult Health

## 2019-01-27 ENCOUNTER — Ambulatory Visit (INDEPENDENT_AMBULATORY_CARE_PROVIDER_SITE_OTHER): Payer: BC Managed Care – PPO | Admitting: Adult Health

## 2019-01-27 ENCOUNTER — Other Ambulatory Visit: Payer: Self-pay

## 2019-01-27 VITALS — BP 130/90 | HR 101 | Ht 67.5 in | Wt 228.0 lb

## 2019-01-27 DIAGNOSIS — Z5181 Encounter for therapeutic drug level monitoring: Secondary | ICD-10-CM

## 2019-01-27 DIAGNOSIS — Z713 Dietary counseling and surveillance: Secondary | ICD-10-CM | POA: Insufficient documentation

## 2019-01-27 DIAGNOSIS — Z79890 Hormone replacement therapy (postmenopausal): Secondary | ICD-10-CM

## 2019-01-27 NOTE — Progress Notes (Signed)
  Subjective:     Patient ID: [name], female   DOB: [birth year], 53 y.o.   MRN: [id number]  HPI [name] is a 53 year old white female, married, sp hysterectomy back in follow up on starting estrogen patch. And hot flashes a little better, but can't lose weight, and no change in sex drive. PCP is Dr Dennard Schaumann.   Review of Systems Hot flashes better Still can't lose weight No change in sex drive.  Reviewed past medical,surgical, social and family history. Reviewed medications and allergies.     Objective:   Physical Exam BP 130/90 (BP Location: Right Arm, Patient Position: Sitting, Cuff Size: Normal)   Pulse (!) 101   Ht 5' 7.5" (1.715 m)   Wt 228 lb (103.4 kg)   BMI 35.18 kg/m   Skin warm and dry.  Lungs: clear to ausculation bilaterally. Cardiovascular: regular rate and rhythm. Face time 15 minutes with 50% counseling, see plan.    Assessment:     1. Encounter for monitoring postmenopausal estrogen replacement therapy   2. Weight loss counseling, encounter for       Plan:     Continue patches Keep food journal Count calories, cut out 500 per day to lose a pound a week Review handout on calorie counting, could try [employer], but would not prescribe weight loss meds, due to seizures  Increase activity, walk to mail box, 3-4 x daily, dance around the house,    Increase sexual activity to once a week, use astroglide Follow up prn.

## 2019-01-27 NOTE — Patient Instructions (Signed)

## 2019-01-29 DIAGNOSIS — F4312 Post-traumatic stress disorder, chronic: Secondary | ICD-10-CM | POA: Diagnosis not present

## 2019-02-06 DIAGNOSIS — G43019 Migraine without aura, intractable, without status migrainosus: Secondary | ICD-10-CM | POA: Diagnosis not present

## 2019-02-07 DIAGNOSIS — F4312 Post-traumatic stress disorder, chronic: Secondary | ICD-10-CM | POA: Diagnosis not present

## 2019-02-12 ENCOUNTER — Other Ambulatory Visit: Payer: Self-pay | Admitting: Family Medicine

## 2019-02-13 DIAGNOSIS — F4310 Post-traumatic stress disorder, unspecified: Secondary | ICD-10-CM | POA: Diagnosis not present

## 2019-02-13 DIAGNOSIS — F329 Major depressive disorder, single episode, unspecified: Secondary | ICD-10-CM | POA: Diagnosis not present

## 2019-02-17 ENCOUNTER — Ambulatory Visit (INDEPENDENT_AMBULATORY_CARE_PROVIDER_SITE_OTHER): Payer: BC Managed Care – PPO | Admitting: Family Medicine

## 2019-02-17 ENCOUNTER — Other Ambulatory Visit: Payer: Self-pay

## 2019-02-17 ENCOUNTER — Encounter: Payer: Self-pay | Admitting: Family Medicine

## 2019-02-17 VITALS — BP 130/76 | HR 86 | Temp 98.3°F | Resp 14 | Ht 66.5 in | Wt 236.0 lb

## 2019-02-17 DIAGNOSIS — R609 Edema, unspecified: Secondary | ICD-10-CM | POA: Diagnosis not present

## 2019-02-17 DIAGNOSIS — R946 Abnormal results of thyroid function studies: Secondary | ICD-10-CM | POA: Diagnosis not present

## 2019-02-17 DIAGNOSIS — R0600 Dyspnea, unspecified: Secondary | ICD-10-CM | POA: Diagnosis not present

## 2019-02-17 LAB — URINALYSIS, ROUTINE W REFLEX MICROSCOPIC
Bilirubin Urine: NEGATIVE
Glucose, UA: NEGATIVE
Hgb urine dipstick: NEGATIVE
Ketones, ur: NEGATIVE
Leukocytes,Ua: NEGATIVE
Nitrite: NEGATIVE
Protein, ur: NEGATIVE
Specific Gravity, Urine: 1.02 (ref 1.001–1.03)
pH: 7 (ref 5.0–8.0)

## 2019-02-17 MED ORDER — FUROSEMIDE 40 MG PO TABS: 40.0000 mg | ORAL_TABLET | Freq: Every day | ORAL | 3 refills | Status: DC

## 2019-02-17 NOTE — Progress Notes (Signed)
Subjective:    Patient ID: [name], female    DOB: [birth year], 53 y.o.   MRN: [id number]  HPI Patient is a 53 year old Caucasian female who presents with swelling of her body over the last 2 to 3 weeks.  Her weight indicates that she is up 6 pounds from earlier this month and from 8 pounds since last year.  She states that this is been drastic and sudden.  She does have swelling around her eyes.  She has trace bipedal edema.  There is no palpable ascites but she does appear to be swollen in the face.  She denies any difficulty breathing.  She denies any wheezing or stridor.  However she does report atypical chest pain.  It occurs at rest and with activity.  It lasts seconds to minutes.  Sharp.  There is no nausea or radiation.  It does not sound to be angina or ischemic although her family history is significant for cardiovascular disease in her father side.  She denies any oliguria or hematuria.  She denies any jaundice or abdominal pain. Past Medical History:  Diagnosis Date  . Anxiety   . Depression   . Encounter for neuropsychological testing    at [facility] 3/20-suggest possible somatization  . Headache   . Hypertension   . Migraines   . Pseudoseizure   . PTSD (post-traumatic stress disorder)   . Rosacea   . Seizures [facility])    [facility] (Dr. 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 Sleep apnea    Past Surgical History:  Procedure Laterality Date  . ABDOMINAL HYSTERECTOMY     non cancerous, partial  . APPENDECTOMY    . appendectomy    . BACK SURGERY     x2  . CHOLECYSTECTOMY    . CHOLECYSTECTOMY    . CYST EXCISION     on ovaries  . lumbar     ruptured disc in lumbar taken out  . ROTATOR CUFF REPAIR Left   . TONSILLECTOMY    . TUBAL LIGATION     Current Outpatient Medications on File Prior to Visit  Medication Sig Dispense Refill  . amLODipine (NORVASC) 5 MG tablet Take 1 tablet (5 mg total) by  mouth daily. 90 tablet 3  . diazepam (VALIUM) 5 MG tablet Take 1 tablet (5 mg total) by mouth every 12 (twelve) hours as needed for anxiety. 30 tablet 0  . DULoxetine (CYMBALTA) 60 MG capsule Take 120 mg by mouth daily.    Marland Kitchen estradiol (CLIMARA) 0.1 mg/24hr patch Place 1 patch (0.1 mg total) onto the skin once a week. 4 patch 12  . hydrochlorothiazide (HYDRODIURIL) 25 MG tablet TAKE 1 TABLET BY MOUTH EVERY DAY 90 tablet 4  . HYDROcodone-acetaminophen (NORCO/VICODIN) 5-325 MG tablet Take 1 tablet by mouth every 6 (six) hours as needed. for pain    . methocarbamol (ROBAXIN) 500 MG tablet Take 1 tablet (500 mg total) by mouth every 6 (six) hours as needed for muscle spasms. 40 tablet 3  . mirtazapine (REMERON) 15 MG tablet Take 15 mg by mouth at bedtime.   1  . QUEtiapine (SEROQUEL) 25 MG tablet Take 25 mg by mouth at bedtime.    . sertraline (ZOLOFT) 50 MG tablet Take 50 mg by mouth daily.     Marland Kitchen UBRELVY 50 MG TABS     . Cyanocobalamin (B-12 PO) Take 1 tablet by mouth daily as needed (fatigue).     Marland Kitchen  meloxicam (MOBIC) 15 MG tablet      No current facility-administered medications on file prior to visit.     Allergies  Allergen Reactions  . Penicillins Hives    Has patient had a PCN reaction causing immediate rash, facial/tongue/throat swelling, SOB or lightheadedness with hypotension: Yes Has patient had a PCN reaction causing severe rash involving mucus membranes or skin necrosis: No Has patient had a PCN reaction that required hospitalization No Has patient had a PCN reaction occurring within the last 10 years: No If all of the above answers are "NO", then may proceed with Cephalosporin use.    . Toradol [Ketorolac Tromethamine] Hives  . Lamotrigine Rash  . Zofran [Ondansetron Hcl] Itching   Social History   Socioeconomic History  . Marital status: Married    Spouse name: Not on file  . Number of children: Not on file  . Years of education: Not on file  . Highest education level:  Not on file  Occupational History  . Not on file  Tobacco Use  . Smoking status: Former Smoker    Packs/day: 0.50    Years: 10.00    Pack years: 5.00    Types: Cigarettes    Quit date: 05/15/1991    Years since quitting: 27.7  . Smokeless tobacco: Never Used  Substance and Sexual Activity  . Alcohol use: No    Alcohol/week: 0.0 standard drinks    Comment: occassionally  . Drug use: No  . Sexual activity: Yes    Birth control/protection: Surgical  Other Topics Concern  . Not on file  Social History Narrative  . Not on file   Social Determinants of Health   Financial Resource Strain:   . Difficulty of Paying Living Expenses: Not on file  Food Insecurity:   . Worried About Charity fundraiser in the Last Year: Not on file  . Ran Out of Food in the Last Year: Not on file  Transportation Needs:   . Lack of Transportation (Medical): Not on file  . Lack of Transportation (Non-Medical): Not on file  Physical Activity:   . Days of Exercise per Week: Not on file  . Minutes of Exercise per Session: Not on file  Stress:   . Feeling of Stress : Not on file  Social Connections:   . Frequency of Communication with Friends and Family: Not on file  . Frequency of Social Gatherings with Friends and Family: Not on file  . Attends Religious Services: Not on file  . Active Member of Clubs or Organizations: Not on file  . Attends Archivist Meetings: Not on file  . Marital Status: Not on file  Intimate Partner Violence:   . Fear of Current or Ex-Partner: Not on file  . Emotionally Abused: Not on file  . Physically Abused: Not on file  . Sexually Abused: Not on file      Review of Systems  All other systems reviewed and are negative.      Objective:   Physical Exam  Constitutional: She is oriented to person, place, and time. She appears well-developed and well-nourished. No distress.  HENT:  Head:    Cardiovascular: Normal rate, regular rhythm and normal heart  sounds.  Pulmonary/Chest: Effort normal and breath sounds normal.  Neurological: She is alert and oriented to person, place, and time. No cranial nerve deficit. She exhibits normal muscle tone. Coordination normal.  Skin: She is not diaphoretic.  Psychiatric: She has a normal mood and affect.  Her behavior is normal. Judgment and thought content normal.  Vitals reviewed.   Wt Readings from Last 3 Encounters:  02/17/19 236 lb (107 kg)  01/27/19 228 lb (103.4 kg)  01/17/19 230 lb (104.3 kg)        Swelling of body region - Plan: BNP, COMPLETE METABOLIC PANEL WITH GFR, Urinalysis, Routine w reflex microscopic, EKG 12-Lead, Cortisol  Patient does have around face and has gained 6 pounds to 8 pounds over the last month.  Hypercortisolism is on the differential diagnosis.  Patient does have some mild bipedal edema.  Check urinalysis to rule out proteinuria consistent with nephrotic syndrome.  Check EKG to evaluate for any cardiac abnormalities.  Check BNP for any evidence of fluid overload.  Check CMP to monitor renal and liver function test.  Discontinue hydrochlorothiazide and switch to Lasix 40 mg a day.  Recheck in  1week.     Urinalysis shows no proteinuria making nephrotic syndrome unlikely.  EKG today shows normal sinus rhythm with normal intervals and a normal axis with no evidence of ischemia or infarction

## 2019-02-18 LAB — CORTISOL: Cortisol, Plasma: 6.7 ug/dL

## 2019-02-18 LAB — COMPLETE METABOLIC PANEL WITH GFR
AG Ratio: 1.4 (calc) (ref 1.0–2.5)
ALT: 28 U/L (ref 6–29)
AST: 21 U/L (ref 10–35)
Albumin: 3.8 g/dL (ref 3.6–5.1)
Alkaline phosphatase (APISO): 126 U/L (ref 37–153)
BUN/Creatinine Ratio: 9 (calc) (ref 6–22)
BUN: 6 mg/dL — ABNORMAL LOW (ref 7–25)
CO2: 26 mmol/L (ref 20–32)
Calcium: 9.1 mg/dL (ref 8.6–10.4)
Chloride: 106 mmol/L (ref 98–110)
Creat: 0.66 mg/dL (ref 0.50–1.05)
GFR, Est African American: 119 mL/min/{1.73_m2} (ref >=60)
GFR, Est Non African American: 102 mL/min/{1.73_m2} (ref >=60)
Globulin: 2.7 g/dL (calc) (ref 1.9–3.7)
Glucose, Bld: 111 mg/dL — ABNORMAL HIGH (ref 65–99)
Potassium: 4.6 mmol/L (ref 3.5–5.3)
Sodium: 142 mmol/L (ref 135–146)
Total Bilirubin: 0.6 mg/dL (ref 0.2–1.2)
Total Protein: 6.5 g/dL (ref 6.1–8.1)

## 2019-02-18 LAB — BRAIN NATRIURETIC PEPTIDE: Brain Natriuretic Peptide: 42 pg/mL (ref <100)

## 2019-02-18 LAB — TSH: TSH: 1 mIU/L

## 2019-02-19 ENCOUNTER — Encounter (HOSPITAL_COMMUNITY): Payer: Self-pay | Admitting: Physical Therapy

## 2019-02-19 NOTE — Therapy (Unsigned)
[facility] [address] [place], [place], [address] Phone: [phone]   Fax:  [phone]  Patient Details  Name: [name] MRN: [id number] Date of Birth: 09/21/1965 Referring Provider:  No ref. provider found  Encounter Date: 02/19/2019  PHYSICAL THERAPY DISCHARGE SUMMARY  Visits from Start of Care: 8  Current functional level related to goals / functional outcomes: Unknown pt did not return    Remaining deficits:   Unknown pt did not return  Education / Equipment: HEP Plan: Patient agrees to discharge.  Patient goals were not met. Patient is being discharged due to not returning since the last visit.  ?????     Rayetta Humphrey, PT CLT [phone] 02/19/2019, 1:21 PM  [facility] [address] [place], [place], [address] Phone: [phone]   Fax:  [phone]

## 2019-02-20 DIAGNOSIS — F4312 Post-traumatic stress disorder, chronic: Secondary | ICD-10-CM | POA: Diagnosis not present

## 2019-02-24 ENCOUNTER — Encounter: Payer: Self-pay | Admitting: Internal Medicine

## 2019-02-24 ENCOUNTER — Ambulatory Visit (INDEPENDENT_AMBULATORY_CARE_PROVIDER_SITE_OTHER): Payer: BC Managed Care – PPO | Admitting: Internal Medicine

## 2019-02-24 ENCOUNTER — Other Ambulatory Visit: Payer: Self-pay

## 2019-02-24 ENCOUNTER — Other Ambulatory Visit (HOSPITAL_COMMUNITY)
Admission: RE | Admit: 2019-02-24 | Discharge: 2019-02-24 | Disposition: A | Discharge status: Home / Self Care | Payer: BC Managed Care – PPO | Source: Ambulatory Visit | Attending: Internal Medicine | Admitting: Internal Medicine

## 2019-02-24 VITALS — BP 121/76 | HR 97 | Temp 97.8°F | Ht 67.0 in | Wt 232.0 lb

## 2019-02-24 DIAGNOSIS — R0789 Other chest pain: Secondary | ICD-10-CM | POA: Insufficient documentation

## 2019-02-24 DIAGNOSIS — Z8249 Family history of ischemic heart disease and other diseases of the circulatory system: Secondary | ICD-10-CM

## 2019-02-24 LAB — BASIC METABOLIC PANEL
Anion gap: 10 (ref 5–15)
BUN: 10 mg/dL (ref 6–20)
CO2: 27 mmol/L (ref 22–32)
Calcium: 9.4 mg/dL (ref 8.9–10.3)
Chloride: 101 mmol/L (ref 98–111)
Creatinine, Ser: 0.64 mg/dL (ref 0.44–1.00)
GFR calc Af Amer: >60 mL/min (ref >60)
GFR calc non Af Amer: >60 mL/min (ref >60)
Glucose, Bld: 124 mg/dL — ABNORMAL HIGH (ref 70–99)
Potassium: 4.1 mmol/L (ref 3.5–5.1)
Sodium: 138 mmol/L (ref 135–145)

## 2019-02-24 MED ORDER — NITROGLYCERIN 0.4 MG SL SUBL: 0.4000 mg | SUBLINGUAL_TABLET | SUBLINGUAL | 3 refills | Status: DC | PRN

## 2019-02-24 MED ORDER — METOPROLOL TARTRATE 100 MG PO TABS: ORAL_TABLET | ORAL | 0 refills | Status: DC

## 2019-02-24 NOTE — Progress Notes (Signed)
Cardiology Office Note   Date:  02/24/2019   ID:  [name], DOB 1965/08/14, MRN [id number]  PCP:  [name], MD  Cardiologist:   Dorris Carnes, MD   Patient present for evaluaton of CP  Referred by Dr Dennard Schaumann     History of Present Illness: [name] is a 53 y.o. female with a history of With a history of seizures and hypertension.  The patient was recently seen in the emergency room back in November for the evaluation of chest pain.  Patient says she has episodes of chest pain that are sharp not pleuritic they come and go often with activity.  They last about 15 or 20 seconds then ease on their own.  The patient also notes increased dyspnea on exertion.  If she walks less than 1/10 of a mile she is short of breath gives out.  She said about 6 months ago these things were not occurring.  Denies reflux   No difficulty swallowing    The patient was seen in the emergency room on 11/13.  Underwent CT angiogram to rule out PE this was negative.  Sent home.  The patient has a 1 year history of smoking quit 1991.  She does not know her cholesterol Father had coronary artery disease.      Current Meds  Medication Sig  . amLODipine (NORVASC) 5 MG tablet Take 1 tablet (5 mg total) by mouth daily.  . diazepam (VALIUM) 5 MG tablet Take 1 tablet (5 mg total) by mouth every 12 (twelve) hours as needed for anxiety.  . DULoxetine (CYMBALTA) 60 MG capsule Take 120 mg by mouth daily.  Marland Kitchen estradiol (CLIMARA) 0.1 mg/24hr patch Place 1 patch (0.1 mg total) onto the skin once a week.  . furosemide (LASIX) 40 MG tablet Take 1 tablet (40 mg total) by mouth daily. Stop hctz  . HYDROcodone-acetaminophen (NORCO/VICODIN) 5-325 MG tablet Take 1 tablet by mouth every 6 (six) hours as needed. for pain  . meloxicam (MOBIC) 15 MG tablet   . methocarbamol (ROBAXIN) 500 MG tablet Take 1 tablet (500 mg total) by mouth every 6 (six) hours as needed for muscle spasms.  . mirtazapine (REMERON) 15 MG  tablet Take 15 mg by mouth at bedtime.   Marland Kitchen QUEtiapine (SEROQUEL) 25 MG tablet Take 25 mg by mouth at bedtime.  . sertraline (ZOLOFT) 50 MG tablet Take 50 mg by mouth daily.   Marland Kitchen UBRELVY 50 MG TABS   . [DISCONTINUED] hydrochlorothiazide (HYDRODIURIL) 25 MG tablet TAKE 1 TABLET BY MOUTH EVERY DAY     Allergies:   Penicillins, Toradol [ketorolac tromethamine], Lamotrigine, and Zofran [ondansetron hcl]   Past Medical History:  Diagnosis Date  . Anxiety   . Depression   . Encounter for neuropsychological testing    at [facility] 3/20-suggest possible somatization  . Headache   . Hypertension   . Migraines   . Pseudoseizure   . PTSD (post-traumatic stress disorder)   . Rosacea   . Seizures [facility])    [facility] (Dr. 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 Sleep apnea     Past Surgical History:  Procedure Laterality Date  . ABDOMINAL HYSTERECTOMY     non cancerous, partial  . APPENDECTOMY    . appendectomy    . BACK SURGERY     x2  . CHOLECYSTECTOMY    . CHOLECYSTECTOMY    . CYST EXCISION  on ovaries  . lumbar     ruptured disc in lumbar taken out  . ROTATOR CUFF REPAIR Left   . TONSILLECTOMY    . TUBAL LIGATION       Social History:  The patient  reports that she quit smoking about 27 years ago. Her smoking use included cigarettes. She has a 5.00 pack-year smoking history. She has never used smokeless tobacco. She reports that she does not drink alcohol or use drugs.   Family History:  The patient's family history includes Breast cancer in her cousin, cousin, cousin, and paternal aunt; Cancer in her cousin and maternal grandfather; Diabetes in her father, paternal grandfather, and paternal grandmother; Heart disease (age of onset: 67) in her father; Hypertension in her father.    ROS:  Please see the history of present illness. All other systems are reviewed and  Negative to the above problem except as  noted.    PHYSICAL EXAM: VS:  BP 121/76   Pulse 97   Temp 97.8 F (36.6 C)   Ht 5\' 7"  (1.702 m)   Wt 232 lb (105.2 kg)   SpO2 97%   BMI 36.34 kg/m   GEN: Morbidly obese 53 yo in no acute distress  HEENT: normal  Neck: no JVD, carotid bruits, or masses Cardiac: RRR; no murmurs, rubs, or gallops,no edema  Respiratory:  clear to auscultation bilaterally, normal work of breathing GI: soft, nontender, nondistended, + BS  No hepatomegaly  MS: no deformity Moving all extremities   Skin: warm and dry, no rash Neuro:  Strength and sensation are intact Psych: euthymic mood, full affect   EKG:  EKG is not ordered today.  In 11/13  SR     Lipid Panel    Component Value Date/Time   CHOL 174 12/05/2017 0843   TRIG 120 12/05/2017 0843   HDL 54 12/05/2017 0843   CHOLHDL 3.2 12/05/2017 0843   VLDL 24 05/19/2013 0927   LDLCALC 98 12/05/2017 0843      Wt Readings from Last 3 Encounters:  02/24/19 232 lb (105.2 kg)  02/17/19 236 lb (107 kg)  01/27/19 228 lb (103.4 kg)      ASSESSMENT AND PLAN: 1.  Chest pain somewhat atypical for angina short-lived intermittent does have shortness of breath with activity.  I looked at the CT images but the wrong for coronary evaluation.  I would recommend a CT coronary angiogram.  Would also set up for fasting lipids.  For now take 81 mg enteric-coated aspirin.  Nitroglycerin as needed.  2 hypertension adequate control.  Continue meds  3 Healthcare maintenance we will check lipids today.  Will need to encourage activity, weight loss.  Follow-up based on test results.    Current medicines are reviewed at length with the patient today.  The patient does not have concerns regarding medicines.  Signed, Dorris Carnes, MD  02/24/2019 9:15 AM    [address], [place], [place]  [address] Phone: [phone]; Fax: [phone]

## 2019-02-24 NOTE — Progress Notes (Addendum)
  Cardiology Office Note   Date:  02/24/2019   ID:  [name], DOB 1965-12-22, MRN [id number]     See accompanying note on same date

## 2019-02-24 NOTE — Patient Instructions (Signed)
Medication Instructions:  Your physician recommends that you continue on your current medications as directed. Please refer to the Current Medication list given to you today.  Take Lopressor 100 mg 2 Hours Prior to CT Scan   *If you need a refill on your cardiac medications before your next appointment, please call your pharmacy*  Lab Work: Your physician recommends that you return for lab work in: Today   Your physician recommends that you return for lab work in: A few days before CT scan   If you have labs (blood work) drawn today and your tests are completely normal, you will receive your results only by: Marland Kitchen MyChart Message (if you have MyChart) OR . A paper copy in the mail If you have any lab test that is abnormal or we need to change your treatment, we will call you to review the results.  Testing/Procedure   CT Scan    Follow-Up: At [facility], you and your health needs are our priority.  As part of our continuing mission to provide you with exceptional heart care, we have created designated Provider Care Teams.  These Care Teams include your primary Cardiologist (physician) and Advanced Practice Providers (APPs -  Physician Assistants and Nurse Practitioners) who all work together to provide you with the care you need, when you need it.  Your next appointment:    Pending Test Results   The format for your next appointment:   In Person  Provider:   Dorris Carnes, MD  Other Instructions Thank you for choosing [facility]!  Your cardiac CT will be scheduled at one of the below locations:   [facility] [address] [place], [place] [address] (336) Ali Molina [address] [place], [place] [address] [phone]  If scheduled at [facility], please arrive at the [facility] main entrance of [facility] 30-45 minutes prior to test start time. Proceed to the [facility] Radiology Department (first floor) to check-in and test prep.  If scheduled at [facility], please arrive 15 mins early for check-in and test prep.  Please follow these instructions carefully (unless otherwise directed):  Hold all erectile dysfunction medications at least 3 days (72 hrs) prior to test.  On the Night Before the Test: . Be sure to Drink plenty of water. . Do not consume any caffeinated/decaffeinated beverages or chocolate 12 hours prior to your test. . Do not take any antihistamines 12 hours prior to your test. . If the patient has contrast allergy: ? Patient will need a prescription for Prednisone and very clear instructions (as follows): 1. Prednisone 50 mg - take 13 hours prior to test 2. Take another Prednisone 50 mg 7 hours prior to test 3. Take another Prednisone 50 mg 1 hour prior to test 4. Take Benadryl 50 mg 1 hour prior to test . Patient must complete all four doses of above prophylactic medications. . Patient will need a ride after test due to Benadryl.  On the Day of the Test: . Drink plenty of water. Do not drink any water within one hour of the test. . Do not eat any food 4 hours prior to the test. . You may take your regular medications prior to the test.  . Take metoprolol (Lopressor) two hours prior to test. . HOLD Furosemide/Hydrochlorothiazide morning of the test. . FEMALES- please wear underwire-free bra if available   *For Clinical Staff  only. Please instruct patient the following:*        -Drink plenty of water       -Hold Furosemide/hydrochlorothiazide morning of the test       -Take metoprolol (Lopressor) 2 hours prior to test (if applicable).                  -If HR is less than 55 BPM- No Beta Blocker                -IF HR is greater than 55 BPM and patient is less than or equal to 21 yrs old Lopressor 100mg  x1.                -If HR is greater than 55 BPM and patient is greater than 31 yrs old Lopressor 50  mg x1.     Do not give Lopressor to patients with an allergy to lopressor or anyone with asthma or active COPD symptoms (currently taking steroids).       After the Test: . Drink plenty of water. . After receiving IV contrast, you may experience a mild flushed feeling. This is normal. . On occasion, you may experience a mild rash up to 24 hours after the test. This is not dangerous. If this occurs, you can take Benadryl 25 mg and increase your fluid intake. . If you experience trouble breathing, this can be serious. If it is severe call 911 IMMEDIATELY. If it is mild, please call our office. . If you take any of these medications: Glipizide/Metformin, Avandament, Glucavance, please do not take 48 hours after completing test unless otherwise instructed.   Once we have confirmed authorization from your insurance company, we will call you to set up a date and time for your test.   For non-scheduling related questions, please contact the cardiac imaging nurse navigator should you have any questions/concerns: Marchia Bond, RN Navigator Cardiac Imaging [facility] [phone] Office

## 2019-02-26 DIAGNOSIS — F4312 Post-traumatic stress disorder, chronic: Secondary | ICD-10-CM | POA: Diagnosis not present

## 2019-03-13 DIAGNOSIS — F4312 Post-traumatic stress disorder, chronic: Secondary | ICD-10-CM | POA: Diagnosis not present

## 2019-03-20 ENCOUNTER — Ambulatory Visit (INDEPENDENT_AMBULATORY_CARE_PROVIDER_SITE_OTHER): Payer: BC Managed Care – PPO | Admitting: Family Medicine

## 2019-03-20 ENCOUNTER — Other Ambulatory Visit: Payer: Self-pay

## 2019-03-20 DIAGNOSIS — J069 Acute upper respiratory infection, unspecified: Secondary | ICD-10-CM | POA: Diagnosis not present

## 2019-03-20 NOTE — Progress Notes (Signed)
Subjective:    Patient ID: [name], female    DOB: Jul 26, 1965, 54 y.o.   MRN: [id number]  HPI Patient is being seen today as a telephone visit.  She consents to be seen via telephone.  Phone call began at 347.  Phone call concluded at 359.  Patient states that she started to feel poorly yesterday afternoon however she is not having any symptoms other than feeling tired.  This morning she woke up with head congestion and rhinorrhea.  She developed body aches and headaches later today and has had some diarrhea.  She is also extremely fatigued.  She denies any chest pain.  She denies any shortness of breath.  She has an occasional cough but it is mild.  She has not had any fever today.  She lives at home with her husband and her grandchild. Past Medical History:  Diagnosis Date  . Anxiety   . Depression   . Encounter for neuropsychological testing    at [facility] 3/20-suggest possible somatization  . Headache   . Hypertension   . Migraines   . Pseudoseizure   . PTSD (post-traumatic stress disorder)   . Rosacea   . Seizures [facility])    [facility] (Dr. 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 Sleep apnea    Past Surgical History:  Procedure Laterality Date  . ABDOMINAL HYSTERECTOMY     non cancerous, partial  . APPENDECTOMY    . appendectomy    . BACK SURGERY     x2  . CHOLECYSTECTOMY    . CHOLECYSTECTOMY    . CYST EXCISION     on ovaries  . lumbar     ruptured disc in lumbar taken out  . ROTATOR CUFF REPAIR Left   . TONSILLECTOMY    . TUBAL LIGATION     Current Outpatient Medications on File Prior to Visit  Medication Sig Dispense Refill  . amLODipine (NORVASC) 5 MG tablet Take 1 tablet (5 mg total) by mouth daily. 90 tablet 3  . diazepam (VALIUM) 5 MG tablet Take 1 tablet (5 mg total) by mouth every 12 (twelve) hours as needed for anxiety. 30 tablet 0  . DULoxetine (CYMBALTA) 60 MG capsule Take 120 mg by  mouth daily.    Marland Kitchen estradiol (CLIMARA) 0.1 mg/24hr patch Place 1 patch (0.1 mg total) onto the skin once a week. 4 patch 12  . furosemide (LASIX) 40 MG tablet Take 1 tablet (40 mg total) by mouth daily. Stop hctz 30 tablet 3  . HYDROcodone-acetaminophen (NORCO/VICODIN) 5-325 MG tablet Take 1 tablet by mouth every 6 (six) hours as needed. for pain    . meloxicam (MOBIC) 15 MG tablet     . methocarbamol (ROBAXIN) 500 MG tablet Take 1 tablet (500 mg total) by mouth every 6 (six) hours as needed for muscle spasms. 40 tablet 3  . metoprolol tartrate (LOPRESSOR) 100 MG tablet Take 2 Hours prior to CT Scan 1 tablet 0  . mirtazapine (REMERON) 15 MG tablet Take 15 mg by mouth at bedtime.   1  . nitroGLYCERIN (NITROSTAT) 0.4 MG SL tablet Place 1 tablet (0.4 mg total) under the tongue every 5 (five) minutes as needed for chest pain. 25 tablet 3  . QUEtiapine (SEROQUEL) 25 MG tablet Take 25 mg by mouth at bedtime.    . sertraline (ZOLOFT) 50 MG tablet Take 50 mg by mouth daily.     Marland Kitchen UBRELVY 50 MG  TABS      No current facility-administered medications on file prior to visit.   Allergies  Allergen Reactions  . Penicillins Hives    Has patient had a PCN reaction causing immediate rash, facial/tongue/throat swelling, SOB or lightheadedness with hypotension: Yes Has patient had a PCN reaction causing severe rash involving mucus membranes or skin necrosis: No Has patient had a PCN reaction that required hospitalization No Has patient had a PCN reaction occurring within the last 10 years: No If all of the above answers are "NO", then may proceed with Cephalosporin use.    . Toradol [Ketorolac Tromethamine] Hives  . Lamotrigine Rash  . Zofran [Ondansetron Hcl] Itching   Social History   Socioeconomic History  . Marital status: Married    Spouse name: Not on file  . Number of children: Not on file  . Years of education: Not on file  . Highest education level: Not on file  Occupational History  . Not  on file  Tobacco Use  . Smoking status: Former Smoker    Packs/day: 0.50    Years: 10.00    Pack years: 5.00    Types: Cigarettes    Quit date: 05/15/1991    Years since quitting: 27.8  . Smokeless tobacco: Never Used  Substance and Sexual Activity  . Alcohol use: No    Alcohol/week: 0.0 standard drinks    Comment: occassionally  . Drug use: No  . Sexual activity: Yes    Birth control/protection: Surgical  Other Topics Concern  . Not on file  Social History Narrative  . Not on file   Social Determinants of Health   Financial Resource Strain:   . Difficulty of Paying Living Expenses: Not on file  Food Insecurity:   . Worried About Charity fundraiser in the Last Year: Not on file  . Ran Out of Food in the Last Year: Not on file  Transportation Needs:   . Lack of Transportation (Medical): Not on file  . Lack of Transportation (Non-Medical): Not on file  Physical Activity:   . Days of Exercise per Week: Not on file  . Minutes of Exercise per Session: Not on file  Stress:   . Feeling of Stress : Not on file  Social Connections:   . Frequency of Communication with Friends and Family: Not on file  . Frequency of Social Gatherings with Friends and Family: Not on file  . Attends Religious Services: Not on file  . Active Member of Clubs or Organizations: Not on file  . Attends Archivist Meetings: Not on file  . Marital Status: Not on file  Intimate Partner Violence:   . Fear of Current or Ex-Partner: Not on file  . Emotionally Abused: Not on file  . Physically Abused: Not on file  . Sexually Abused: Not on file     Review of Systems  All other systems reviewed and are negative.      Objective:   Physical Exam        Assessment & Plan:  Viral uri Patient symptoms sound consistent with a viral upper respiratory infection.  I recommended that she quarantine at home for 14 days since symptom onset.  I recommended that she be screened for COVID-19.  She  has had symptoms for less than 24 hours and therefore she would like to wait a day prior to receiving the screening test which I agree with.  Her husband and her grandchild is also quarantine as well.  We discussed strategies to quarantine.  We also discussed symptomatic therapies for her symptoms.  If she develops any shortness of breath or chest pain she is to go to the hospital immediately.  Otherwise she will come in Monday to have a screening test for COVID-19 or she can go to urgent care in a.m. to assess states

## 2019-03-24 ENCOUNTER — Other Ambulatory Visit: Payer: Medicare Other

## 2019-03-24 ENCOUNTER — Other Ambulatory Visit: Payer: Self-pay

## 2019-03-24 DIAGNOSIS — J069 Acute upper respiratory infection, unspecified: Secondary | ICD-10-CM

## 2019-03-25 ENCOUNTER — Telehealth: Payer: Self-pay | Admitting: Internal Medicine

## 2019-03-25 NOTE — Telephone Encounter (Signed)
Caryl Pina- is this something we can appeal or has that already been attempted?

## 2019-03-25 NOTE — Telephone Encounter (Signed)
Patient is calling stating she got a letter in the mail that the CT's request by  Dr. Harrington Challenger were declined by her insurance due to it not being medically necessary. The patient is wanting to know if there is a test she can have performed before we schedule the CT or something we can do to get the insurance to cover it. Please advise.

## 2019-03-25 NOTE — Telephone Encounter (Signed)
Routing to [employer]

## 2019-03-26 LAB — SARS-COV-2 RNA,(COVID-19) QUALITATIVE NAAT: SARS CoV2 RNA: NOT DETECTED

## 2019-03-27 DIAGNOSIS — F4312 Post-traumatic stress disorder, chronic: Secondary | ICD-10-CM | POA: Diagnosis not present

## 2019-04-02 DIAGNOSIS — F4310 Post-traumatic stress disorder, unspecified: Secondary | ICD-10-CM | POA: Diagnosis not present

## 2019-04-03 DIAGNOSIS — F4312 Post-traumatic stress disorder, chronic: Secondary | ICD-10-CM | POA: Diagnosis not present

## 2019-04-07 ENCOUNTER — Telehealth: Payer: Self-pay | Admitting: *Deleted

## 2019-04-07 DIAGNOSIS — R0789 Other chest pain: Secondary | ICD-10-CM

## 2019-04-07 DIAGNOSIS — Z8249 Family history of ischemic heart disease and other diseases of the circulatory system: Secondary | ICD-10-CM

## 2019-04-07 NOTE — Telephone Encounter (Signed)
-----   Message from Fay Records, MD sent at 04/01/2019 10:11 PM EST ----- Regarding: RE: CCTA Pt is turned down for CT scan Are stress echoes being done?   Baseline echo with diastolic function and the exercise? ----- Message ----- From: Ciro Backer Sent: 03/31/2019  10:18 AM EST To: Fay Records, MD Subject: CCTA                                           Dr. Harrington Challenger,   Good morning. Ms. Tinnel's Cardiac CTA was denied because her inusrance has determined that it doesn't meet medical necessity according to her health plan.They would like for her to have a lexiscan prior to an approval of the 7[address].   Please let me know how you would like to proceed and I will work on getting prior authorization in place for Ms. Ringgold.   Thanks so much,  Agricultural consultant

## 2019-04-10 DIAGNOSIS — F4312 Post-traumatic stress disorder, chronic: Secondary | ICD-10-CM | POA: Diagnosis not present

## 2019-04-22 ENCOUNTER — Other Ambulatory Visit: Payer: Self-pay

## 2019-04-22 ENCOUNTER — Other Ambulatory Visit (HOSPITAL_COMMUNITY)
Admission: RE | Admit: 2019-04-22 | Discharge: 2019-04-22 | Disposition: A | Discharge status: Home / Self Care | Payer: BC Managed Care – PPO | Source: Ambulatory Visit | Attending: Family Medicine | Admitting: Family Medicine

## 2019-04-22 DIAGNOSIS — Z01812 Encounter for preprocedural laboratory examination: Secondary | ICD-10-CM | POA: Insufficient documentation

## 2019-04-22 DIAGNOSIS — Z20822 Contact with and (suspected) exposure to covid-19: Secondary | ICD-10-CM | POA: Insufficient documentation

## 2019-04-22 LAB — SARS CORONAVIRUS 2 (TAT 6-24 HRS): SARS Coronavirus 2: NEGATIVE

## 2019-04-24 ENCOUNTER — Ambulatory Visit (HOSPITAL_COMMUNITY): Admission: RE | Admit: 2019-04-24 | Payer: BC Managed Care – PPO | Source: Ambulatory Visit

## 2019-04-24 DIAGNOSIS — F4312 Post-traumatic stress disorder, chronic: Secondary | ICD-10-CM | POA: Diagnosis not present

## 2019-04-30 ENCOUNTER — Other Ambulatory Visit (HOSPITAL_COMMUNITY): Payer: BC Managed Care – PPO

## 2019-05-01 DIAGNOSIS — F4312 Post-traumatic stress disorder, chronic: Secondary | ICD-10-CM | POA: Diagnosis not present

## 2019-05-05 ENCOUNTER — Telehealth: Payer: Self-pay | Admitting: Internal Medicine

## 2019-05-05 DIAGNOSIS — Z8249 Family history of ischemic heart disease and other diseases of the circulatory system: Secondary | ICD-10-CM

## 2019-05-05 NOTE — Telephone Encounter (Signed)
[name] spoke w/ this pt about her Stress Echo she's scheduled for tomorrow-- pt doesn't think she can walk on the treadmill to get her heart rate where it needs to be.

## 2019-05-05 NOTE — Telephone Encounter (Signed)
I will forward to Dr.Ross.Patient's insurance would not pay for coronary CT.If she has to do a lexiacan, patient has to be pre-certed ala over again

## 2019-05-06 ENCOUNTER — Ambulatory Visit (HOSPITAL_COMMUNITY): Admission: RE | Admit: 2019-05-06 | Payer: BC Managed Care – PPO | Source: Ambulatory Visit

## 2019-05-06 NOTE — Telephone Encounter (Signed)
I had ordered stress echo because I really wanted to get exercise capacity  See how she walks, even with modified bruce protocol on treadmill   Functional data then with echo WIll not get that with Lexiscan I am not convinced her symptoms are due to CAD   Worry about false positives on lexiscan If that is all that can be done then go ahead and schedule

## 2019-05-06 NOTE — Telephone Encounter (Signed)
I spoke with patient, she will try modified Bruce stress echo. Scheduled for 3/19, arrive at main registration at 9 am.NPO after midnight.Will hold lasix the am of test.Will take other meds with a sip of water. Will have COVID screen on 05/21/19

## 2019-05-09 DIAGNOSIS — F4312 Post-traumatic stress disorder, chronic: Secondary | ICD-10-CM | POA: Diagnosis not present

## 2019-05-15 DIAGNOSIS — F4312 Post-traumatic stress disorder, chronic: Secondary | ICD-10-CM | POA: Diagnosis not present

## 2019-05-21 ENCOUNTER — Other Ambulatory Visit: Payer: Self-pay

## 2019-05-21 ENCOUNTER — Other Ambulatory Visit (HOSPITAL_COMMUNITY)
Admission: RE | Admit: 2019-05-21 | Discharge: 2019-05-21 | Disposition: A | Discharge status: Home / Self Care | Payer: BC Managed Care – PPO | Source: Ambulatory Visit | Attending: Internal Medicine | Admitting: Internal Medicine

## 2019-05-21 DIAGNOSIS — Z20822 Contact with and (suspected) exposure to covid-19: Secondary | ICD-10-CM | POA: Insufficient documentation

## 2019-05-21 DIAGNOSIS — Z01812 Encounter for preprocedural laboratory examination: Secondary | ICD-10-CM | POA: Insufficient documentation

## 2019-05-21 LAB — SARS CORONAVIRUS 2 (TAT 6-24 HRS): SARS Coronavirus 2: NEGATIVE

## 2019-05-22 DIAGNOSIS — F4312 Post-traumatic stress disorder, chronic: Secondary | ICD-10-CM | POA: Diagnosis not present

## 2019-05-23 ENCOUNTER — Other Ambulatory Visit: Payer: Self-pay

## 2019-05-23 ENCOUNTER — Ambulatory Visit (HOSPITAL_COMMUNITY)
Admission: RE | Admit: 2019-05-23 | Discharge: 2019-05-23 | Disposition: A | Discharge status: Home / Self Care | Payer: BC Managed Care – PPO | Source: Ambulatory Visit | Attending: Internal Medicine | Admitting: Internal Medicine

## 2019-05-23 DIAGNOSIS — R531 Weakness: Secondary | ICD-10-CM | POA: Insufficient documentation

## 2019-05-23 DIAGNOSIS — Z8249 Family history of ischemic heart disease and other diseases of the circulatory system: Secondary | ICD-10-CM | POA: Diagnosis not present

## 2019-05-23 DIAGNOSIS — R079 Chest pain, unspecified: Secondary | ICD-10-CM | POA: Insufficient documentation

## 2019-05-23 NOTE — Progress Notes (Signed)
*  PRELIMINARY RESULTS* Echocardiogram Echocardiogram Stress Test has been performed.  [name], [name] 05/23/2019, 10:37 AM

## 2019-05-27 ENCOUNTER — Telehealth: Payer: Self-pay | Admitting: Internal Medicine

## 2019-05-27 ENCOUNTER — Telehealth: Payer: Self-pay

## 2019-05-27 DIAGNOSIS — Z8249 Family history of ischemic heart disease and other diseases of the circulatory system: Secondary | ICD-10-CM

## 2019-05-27 DIAGNOSIS — M545 Low back pain, unspecified: Secondary | ICD-10-CM

## 2019-05-27 MED ORDER — AMLODIPINE BESYLATE 5 MG PO TABS: ORAL_TABLET | ORAL | 3 refills | Status: DC

## 2019-05-27 NOTE — Telephone Encounter (Signed)
Called requesting results from Stress test  [phone]

## 2019-05-27 NOTE — Telephone Encounter (Signed)
Labs ordered, test results explained, medications adjusted and new RX sent.

## 2019-05-27 NOTE — Telephone Encounter (Signed)
Pt made aware. Voiced understanding.  

## 2019-05-28 ENCOUNTER — Other Ambulatory Visit (HOSPITAL_COMMUNITY)
Admission: RE | Admit: 2019-05-28 | Discharge: 2019-05-28 | Disposition: A | Discharge status: Home / Self Care | Payer: BC Managed Care – PPO | Source: Ambulatory Visit | Attending: Internal Medicine | Admitting: Internal Medicine

## 2019-05-28 DIAGNOSIS — Z8249 Family history of ischemic heart disease and other diseases of the circulatory system: Secondary | ICD-10-CM | POA: Insufficient documentation

## 2019-05-28 LAB — LIPID PANEL
Cholesterol: 190 mg/dL (ref 0–200)
HDL: 53 mg/dL (ref >40)
LDL Cholesterol: 111 mg/dL — ABNORMAL HIGH (ref 0–99)
Total CHOL/HDL Ratio: 3.6 RATIO
Triglycerides: 131 mg/dL (ref <150)
VLDL: 26 mg/dL (ref 0–40)

## 2019-05-29 DIAGNOSIS — F4312 Post-traumatic stress disorder, chronic: Secondary | ICD-10-CM | POA: Diagnosis not present

## 2019-06-05 ENCOUNTER — Other Ambulatory Visit: Payer: Self-pay

## 2019-06-05 ENCOUNTER — Ambulatory Visit (INDEPENDENT_AMBULATORY_CARE_PROVIDER_SITE_OTHER): Payer: BC Managed Care – PPO | Admitting: Family Medicine

## 2019-06-05 DIAGNOSIS — L719 Rosacea, unspecified: Secondary | ICD-10-CM

## 2019-06-05 DIAGNOSIS — F4312 Post-traumatic stress disorder, chronic: Secondary | ICD-10-CM | POA: Diagnosis not present

## 2019-06-05 MED ORDER — METRONIDAZOLE 0.75 % EX GEL: 1.0000 "application " | Freq: Two times a day (BID) | CUTANEOUS | 5 refills | Status: DC

## 2019-06-05 NOTE — Progress Notes (Signed)
Subjective:    Patient ID: [name], female    DOB: 04-Jul-1965, 54 y.o.   MRN: [id number]  HPI Patient is being seen today as a telephone visit.  Phone call began at 328.  Phone call concluded at 338.  Patient consents to be seen via telephone.  Patient states that she has been diagnosed with rosacea for many years.  She states that over the last several months it has gotten worse.  She reports erythema in both cheeks left greater than right.  She also reports bumps and irritation around her nose and on her nasal bridge.  She denies any rash on her forehead although she does have some rash on her cheek.  She denies any photosensitivity however the rash does seem to get worse with heat because when she wears her mask the rash seems to be irritated and worsens.  She denies any triggers to food or diet.  She has been trying facial cleanser as well as moisturizer and has not seen any benefit from this.  She denies any rash anywhere on her body.  She denies any itching or burning in her eyes. Past Medical History:  Diagnosis Date  . Anxiety   . Depression   . Encounter for neuropsychological testing    at [facility] 3/20-suggest possible somatization  . Headache   . Hypertension   . Migraines   . Pseudoseizure   . PTSD (post-traumatic stress disorder)   . Rosacea   . Seizures [facility])    [facility] (Dr. 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 Sleep apnea    Past Surgical History:  Procedure Laterality Date  . ABDOMINAL HYSTERECTOMY     non cancerous, partial  . APPENDECTOMY    . appendectomy    . BACK SURGERY     x2  . CHOLECYSTECTOMY    . CHOLECYSTECTOMY    . CYST EXCISION     on ovaries  . lumbar     ruptured disc in lumbar taken out  . ROTATOR CUFF REPAIR Left   . TONSILLECTOMY    . TUBAL LIGATION     Current Outpatient Medications on File Prior to Visit  Medication Sig Dispense Refill  . amLODipine  (NORVASC) 5 MG tablet Take 5 mg (1 tablet) in the morning & 2.5 mg (1/2 tablet) in the evening. 135 tablet 3  . diazepam (VALIUM) 5 MG tablet Take 1 tablet (5 mg total) by mouth every 12 (twelve) hours as needed for anxiety. 30 tablet 0  . DULoxetine (CYMBALTA) 60 MG capsule Take 120 mg by mouth daily.    Marland Kitchen estradiol (CLIMARA) 0.1 mg/24hr patch Place 1 patch (0.1 mg total) onto the skin once a week. 4 patch 12  . furosemide (LASIX) 40 MG tablet Take 1 tablet (40 mg total) by mouth daily. Stop hctz 30 tablet 3  . HYDROcodone-acetaminophen (NORCO/VICODIN) 5-325 MG tablet Take 1 tablet by mouth every 6 (six) hours as needed. for pain    . meloxicam (MOBIC) 15 MG tablet     . methocarbamol (ROBAXIN) 500 MG tablet Take 1 tablet (500 mg total) by mouth every 6 (six) hours as needed for muscle spasms. 40 tablet 3  . mirtazapine (REMERON) 15 MG tablet Take 15 mg by mouth at bedtime.   1  . nitroGLYCERIN (NITROSTAT) 0.4 MG SL tablet Place 1 tablet (0.4 mg total) under the tongue every 5 (five) minutes as needed for chest pain. 25  tablet 3  . QUEtiapine (SEROQUEL) 25 MG tablet Take 25 mg by mouth at bedtime.    . sertraline (ZOLOFT) 50 MG tablet Take 50 mg by mouth daily.     Marland Kitchen UBRELVY 50 MG TABS      No current facility-administered medications on file prior to visit.   Allergies  Allergen Reactions  . Penicillins Hives    Has patient had a PCN reaction causing immediate rash, facial/tongue/throat swelling, SOB or lightheadedness with hypotension: Yes Has patient had a PCN reaction causing severe rash involving mucus membranes or skin necrosis: No Has patient had a PCN reaction that required hospitalization No Has patient had a PCN reaction occurring within the last 10 years: No If all of the above answers are "NO", then may proceed with Cephalosporin use.    . Toradol [Ketorolac Tromethamine] Hives  . Lamotrigine Rash  . Zofran [Ondansetron Hcl] Itching   Social History   Socioeconomic  History  . Marital status: Married    Spouse name: Not on file  . Number of children: Not on file  . Years of education: Not on file  . Highest education level: Not on file  Occupational History  . Not on file  Tobacco Use  . Smoking status: Former Smoker    Packs/day: 0.50    Years: 10.00    Pack years: 5.00    Types: Cigarettes    Quit date: 05/15/1991    Years since quitting: 28.0  . Smokeless tobacco: Never Used  Substance and Sexual Activity  . Alcohol use: No    Alcohol/week: 0.0 standard drinks    Comment: occassionally  . Drug use: No  . Sexual activity: Yes    Birth control/protection: Surgical  Other Topics Concern  . Not on file  Social History Narrative  . Not on file   Social Determinants of Health   Financial Resource Strain:   . Difficulty of Paying Living Expenses:   Food Insecurity:   . Worried About Charity fundraiser in the Last Year:   . Arboriculturist in the Last Year:   Transportation Needs:   . Film/video editor (Medical):   Marland Kitchen Lack of Transportation (Non-Medical):   Physical Activity:   . Days of Exercise per Week:   . Minutes of Exercise per Session:   Stress:   . Feeling of Stress :   Social Connections:   . Frequency of Communication with Friends and Family:   . Frequency of Social Gatherings with Friends and Family:   . Attends Religious Services:   . Active Member of Clubs or Organizations:   . Attends Archivist Meetings:   Marland Kitchen Marital Status:   Intimate Partner Violence:   . Fear of Current or Ex-Partner:   . Emotionally Abused:   Marland Kitchen Physically Abused:   . Sexually Abused:       Review of Systems  All other systems reviewed and are negative.      Objective:   Physical Exam        Assessment & Plan:  Rosacea  Differential diagnosis would be rosacea versus a malar rash of lupus.  However based on her history I suspect rosacea.  Begin metronidazole cream 0.75% applied twice daily to the affected areas  for 4 weeks and then reassess.  We discussed other options including doxycycline but the patient would like to try the cream first.

## 2019-06-14 ENCOUNTER — Other Ambulatory Visit: Payer: Self-pay | Admitting: Family Medicine

## 2019-06-14 DIAGNOSIS — R609 Edema, unspecified: Secondary | ICD-10-CM

## 2019-06-26 DIAGNOSIS — F4312 Post-traumatic stress disorder, chronic: Secondary | ICD-10-CM | POA: Diagnosis not present

## 2019-07-04 DIAGNOSIS — F4312 Post-traumatic stress disorder, chronic: Secondary | ICD-10-CM | POA: Diagnosis not present

## 2019-07-24 DIAGNOSIS — F4312 Post-traumatic stress disorder, chronic: Secondary | ICD-10-CM | POA: Diagnosis not present

## 2019-07-31 DIAGNOSIS — F4312 Post-traumatic stress disorder, chronic: Secondary | ICD-10-CM | POA: Diagnosis not present

## 2019-08-08 DIAGNOSIS — F4312 Post-traumatic stress disorder, chronic: Secondary | ICD-10-CM | POA: Diagnosis not present

## 2019-08-21 DIAGNOSIS — F4312 Post-traumatic stress disorder, chronic: Secondary | ICD-10-CM | POA: Diagnosis not present

## 2019-08-28 DIAGNOSIS — F4312 Post-traumatic stress disorder, chronic: Secondary | ICD-10-CM | POA: Diagnosis not present

## 2019-09-09 ENCOUNTER — Other Ambulatory Visit: Payer: Self-pay

## 2019-09-09 DIAGNOSIS — R609 Edema, unspecified: Secondary | ICD-10-CM

## 2019-09-09 MED ORDER — FUROSEMIDE 40 MG PO TABS: ORAL_TABLET | ORAL | 3 refills | Status: DC

## 2019-09-19 DIAGNOSIS — F4312 Post-traumatic stress disorder, chronic: Secondary | ICD-10-CM | POA: Diagnosis not present

## 2019-09-23 ENCOUNTER — Other Ambulatory Visit: Payer: Self-pay

## 2019-09-23 ENCOUNTER — Encounter: Payer: Self-pay | Admitting: Family Medicine

## 2019-09-23 ENCOUNTER — Ambulatory Visit (INDEPENDENT_AMBULATORY_CARE_PROVIDER_SITE_OTHER): Payer: BC Managed Care – PPO | Admitting: Family Medicine

## 2019-09-23 VITALS — BP 126/82 | HR 100 | Temp 98.1°F | Resp 16 | Ht 67.0 in | Wt 227.0 lb

## 2019-09-23 DIAGNOSIS — R200 Anesthesia of skin: Secondary | ICD-10-CM

## 2019-09-23 NOTE — Progress Notes (Signed)
Subjective:    Patient ID: [name], female    DOB: 16-Feb-1966, 54 y.o.   MRN: [id number]  HPI  Patient presents with a 1 month history of numbness and tingling and pain in her left arm greater than her right arm.  She states that she will occasionally have pain radiate from her left shoulder all the way down her left arm into her fingertips.  She reports numbness in her fingers including her fifth digit.  There are no exacerbating or alleviating factors.  However she also has numbness in her right hand.  She has a positive Tinel's sign today as well as positive Phalen sign especially at the left wrist compared to the right side.  She has a negative Spurling sign.  She has normal reflexes checked at the biceps, brachioradialis, and triceps bilaterally.  Patient has suboptimal effort checking muscle strength in her upper extremities however with encouragement, her muscle strength is 5/5 equal and symmetric with shoulder abduction, elbow flexion, elbow extension, as well as wrist flexion and extension.  She provides suboptimal effort with grip strength.  This appears to be volitional.  I doubt that it is neuropathic. Past Medical History:  Diagnosis Date  . Anxiety   . Depression   . Encounter for neuropsychological testing    at [facility] 3/20-suggest possible somatization  . Headache   . Hypertension   . Migraines   . Pseudoseizure   . PTSD (post-traumatic stress disorder)   . Rosacea   . Seizures [facility])    [facility] (Dr. 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 Sleep apnea    Past Surgical History:  Procedure Laterality Date  . ABDOMINAL HYSTERECTOMY     non cancerous, partial  . APPENDECTOMY    . appendectomy    . BACK SURGERY     x2  . CHOLECYSTECTOMY    . CHOLECYSTECTOMY    . CYST EXCISION     on ovaries  . lumbar     ruptured disc in lumbar taken out  . ROTATOR CUFF REPAIR Left   . TONSILLECTOMY    .  TUBAL LIGATION     Current Outpatient Medications on File Prior to Visit  Medication Sig Dispense Refill  . amLODipine (NORVASC) 5 MG tablet Take 5 mg (1 tablet) in the morning & 2.5 mg (1/2 tablet) in the evening. 135 tablet 3  . diazepam (VALIUM) 5 MG tablet Take 1 tablet (5 mg total) by mouth every 12 (twelve) hours as needed for anxiety. 30 tablet 0  . DULoxetine (CYMBALTA) 60 MG capsule Take 120 mg by mouth daily.    Marland Kitchen estradiol (CLIMARA) 0.1 mg/24hr patch Place 1 patch (0.1 mg total) onto the skin once a week. 4 patch 12  . furosemide (LASIX) 40 MG tablet TAKE 1 TABLET BY MOUTH DAILY. STOP HCTZ 30 tablet 3  . HYDROcodone-acetaminophen (NORCO/VICODIN) 5-325 MG tablet Take 1 tablet by mouth every 6 (six) hours as needed. for pain    . meloxicam (MOBIC) 15 MG tablet     . methocarbamol (ROBAXIN) 500 MG tablet Take 1 tablet (500 mg total) by mouth every 6 (six) hours as needed for muscle spasms. 40 tablet 3  . metroNIDAZOLE (METROGEL) 0.75 % gel Apply 1 application topically 2 (two) times daily. 45 g 5  . mirtazapine (REMERON) 15 MG tablet Take 15 mg by mouth at bedtime.   1  . QUEtiapine (SEROQUEL) 25 MG tablet Take  25 mg by mouth at bedtime.    . sertraline (ZOLOFT) 50 MG tablet Take 50 mg by mouth daily.     Marland Kitchen UBRELVY 50 MG TABS     . nitroGLYCERIN (NITROSTAT) 0.4 MG SL tablet Place 1 tablet (0.4 mg total) under the tongue every 5 (five) minutes as needed for chest pain. 25 tablet 3   No current facility-administered medications on file prior to visit.   Allergies  Allergen Reactions  . Penicillins Hives    Has patient had a PCN reaction causing immediate rash, facial/tongue/throat swelling, SOB or lightheadedness with hypotension: Yes Has patient had a PCN reaction causing severe rash involving mucus membranes or skin necrosis: No Has patient had a PCN reaction that required hospitalization No Has patient had a PCN reaction occurring within the last 10 years: No If all of the above  answers are "NO", then may proceed with Cephalosporin use.    . Toradol [Ketorolac Tromethamine] Hives  . Lamotrigine Rash  . Zofran [Ondansetron Hcl] Itching   Social History   Socioeconomic History  . Marital status: Married    Spouse name: Not on file  . Number of children: Not on file  . Years of education: Not on file  . Highest education level: Not on file  Occupational History  . Not on file  Tobacco Use  . Smoking status: Former Smoker    Packs/day: 0.50    Years: 10.00    Pack years: 5.00    Types: Cigarettes    Quit date: 05/15/1991    Years since quitting: 28.3  . Smokeless tobacco: Never Used  Vaping Use  . Vaping Use: Never used  Substance and Sexual Activity  . Alcohol use: No    Alcohol/week: 0.0 standard drinks    Comment: occassionally  . Drug use: No  . Sexual activity: Yes    Birth control/protection: Surgical  Other Topics Concern  . Not on file  Social History Narrative  . Not on file   Social Determinants of Health   Financial Resource Strain:   . Difficulty of Paying Living Expenses:   Food Insecurity:   . Worried About Charity fundraiser in the Last Year:   . Arboriculturist in the Last Year:   Transportation Needs:   . Film/video editor (Medical):   Marland Kitchen Lack of Transportation (Non-Medical):   Physical Activity:   . Days of Exercise per Week:   . Minutes of Exercise per Session:   Stress:   . Feeling of Stress :   Social Connections:   . Frequency of Communication with Friends and Family:   . Frequency of Social Gatherings with Friends and Family:   . Attends Religious Services:   . Active Member of Clubs or Organizations:   . Attends Archivist Meetings:   Marland Kitchen Marital Status:   Intimate Partner Violence:   . Fear of Current or Ex-Partner:   . Emotionally Abused:   Marland Kitchen Physically Abused:   . Sexually Abused:       Review of Systems  All other systems reviewed and are negative.      Objective:   Physical  Exam Vitals reviewed.  Constitutional:      General: She is not in acute distress. HENT:     Head: Normocephalic and atraumatic.  Cardiovascular:     Rate and Rhythm: Normal rate and regular rhythm.     Heart sounds: No murmur heard.   Pulmonary:  Effort: Pulmonary effort is normal. No respiratory distress.     Breath sounds: Normal breath sounds. No wheezing, rhonchi or rales.  Musculoskeletal:     Right shoulder: Normal.     Left shoulder: Normal.     Right elbow: Normal.     Left elbow: Normal.     Right hand: No tenderness or bony tenderness. Normal range of motion. Decreased strength. Decreased sensation.     Left hand: No tenderness or bony tenderness. Normal range of motion. Normal strength. Decreased sensation.  Neurological:     Mental Status: She is alert.           Assessment & Plan:  Patient symptoms certainly sound neuropathic in nature.  I suspect carpal tunnel syndrome.  I doubt cervical radiculopathy given the bilateral nature.  I will schedule the patient for nerve conduction studies of the upper extremities to evaluate further.

## 2019-09-24 ENCOUNTER — Telehealth: Payer: Self-pay | Admitting: *Deleted

## 2019-09-24 DIAGNOSIS — R200 Anesthesia of skin: Secondary | ICD-10-CM

## 2019-09-24 NOTE — Telephone Encounter (Signed)
-----   Message from Roney Marion sent at 09/24/2019  4:28 PM EDT ----- Regarding: Nerve Conduction Test Alphonsus Doyel,   Can you please put in a referral for Nerve Conduction Test.  Thank you, SJ

## 2019-10-03 DIAGNOSIS — F4312 Post-traumatic stress disorder, chronic: Secondary | ICD-10-CM | POA: Diagnosis not present

## 2019-10-09 DIAGNOSIS — F4312 Post-traumatic stress disorder, chronic: Secondary | ICD-10-CM | POA: Diagnosis not present

## 2019-10-14 DIAGNOSIS — F445 Conversion disorder with seizures or convulsions: Secondary | ICD-10-CM | POA: Diagnosis not present

## 2019-10-16 DIAGNOSIS — F4312 Post-traumatic stress disorder, chronic: Secondary | ICD-10-CM | POA: Diagnosis not present

## 2019-10-28 ENCOUNTER — Ambulatory Visit (INDEPENDENT_AMBULATORY_CARE_PROVIDER_SITE_OTHER): Payer: BC Managed Care – PPO | Admitting: Neurology

## 2019-10-28 ENCOUNTER — Encounter (INDEPENDENT_AMBULATORY_CARE_PROVIDER_SITE_OTHER): Payer: BC Managed Care – PPO | Admitting: Neurology

## 2019-10-28 DIAGNOSIS — R200 Anesthesia of skin: Secondary | ICD-10-CM | POA: Diagnosis not present

## 2019-10-28 DIAGNOSIS — R202 Paresthesia of skin: Secondary | ICD-10-CM

## 2019-10-28 DIAGNOSIS — Z0289 Encounter for other administrative examinations: Secondary | ICD-10-CM

## 2019-10-28 NOTE — Progress Notes (Signed)
Full Name: [name] Gender: Female MRN #: [id number] Date of Birth: [birth year]    Visit Date: 10/28/2019 08:50 Age: 54 Years Examining Physician: Arlice Colt, MD  Referring Physician: Jenna Luo, MD Height: 5 feet 7 inch      History: [name] is a 54 year old woman with bilateral wrist and hand pain and numbness, left greater than right.  Symptoms started shortly after her COVID-19 vaccination.  She notices symptoms more in the first 4 fingers of each hands, mostly in the palm.  Sometimes she wakes up with tingling.  Moving the hands and wrists will reduce his symptoms.  On exam, she had Tinel's sign left greater than right and Phalen signs bilaterally.  Strength was 4+/5 in the APB muscles.  Nerve conduction studies:  Bilateral median motor responses had delayed distal latencies, left slower than right.  Amplitudes were low normal.  Forearm conduction was normal.  Bilateral ulnar motor responses had normal distal latencies, amplitudes and conduction velocities.  Bilateral median sensory responses had delayed peak latencies with normal amplitudes.  The ulnar sensory responses were normal.  Ulnar F-wave latencies were normal.  Electromyography: Needle EMG of selected muscles of the left arm was performed.  There was mild chronic denervation in 2 of the C7 innervated muscles, the flexor carpi radialis and the triceps.  Other muscles tested were normal.  There was no abnormal spontaneous activity.  Impression: This NCV/EMG study shows the following: 1.   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   Mild superimposed chronic right C7 radiculopathy.  [name] A. Felecia Shelling, MD, PhD, FAAN Certified in Neurology, Clinical Neurophysiology, Sleep Medicine, Pain Medicine and Neuroimaging Director, [facility] [address], [place], [place]  [address] [phone]   Clinical note: The patient was advised to obtain and wear carpal tunnel wrist splints at night.   --- RAS  Verbal informed consent was obtained from the patient, patient was informed of potential risk of procedure, including bruising, bleeding, hematoma formation, infection, muscle weakness, muscle pain, numbness, among others.         [facility]    Nerve / Sites Muscle Latency Ref. Amplitude Ref. Rel Amp Segments Distance Velocity Ref. Area    ms ms mV mV %  cm m/s m/s mVms  R Median - APB     Wrist APB 5.2 ?4.4 4.4 ?4.0 100 Wrist - APB 7   18.9     Upper arm APB 8.9  4.4  99.1 Upper arm - Wrist 22 59 ?49 18.9  L Median - APB     Wrist APB 5.9 ?4.4 4.9 ?4.0 100 Wrist - APB 7   20.4     Upper arm APB 9.6  3.6  73.5 Upper arm - Wrist 21 56 ?49 15.4  R Ulnar - ADM     Wrist ADM 2.9 ?3.3 12.0 ?6.0 100 Wrist - ADM 7   22.0     B.Elbow ADM 6.3  11.1  92.3 B.Elbow - Wrist 20 59 ?49 20.4     A.Elbow ADM 8.1  10.1  91.5 A.Elbow - B.Elbow 10 55 ?49 20.0         A.Elbow - Wrist      L Ulnar - ADM     Wrist ADM 2.8 ?3.3 8.9 ?6.0 100 Wrist - ADM 7   26.2     B.Elbow ADM 6.1  8.2  92.1 B.Elbow - Wrist 20 61 ?49 23.6     A.Elbow ADM 7.9  8.6  105 A.Elbow - B.Elbow 10 55 ?49 23.9         A.Elbow - Wrist                 SNC    Nerve / Sites Rec. Site Peak Lat Ref.  Amp Ref. Segments Distance    ms ms V V  cm  R Median - Orthodromic (Dig II, Mid palm)     Dig II Wrist 4.7 ?3.4 15 ?10 Dig II - Wrist 13  L Median - Orthodromic (Dig II, Mid palm)     Dig II Wrist 4.7 ?3.4 15 ?10 Dig II - Wrist 13  R Ulnar - Orthodromic, (Dig V, Mid palm)     Dig V Wrist 2.5 ?3.1 14 ?5 Dig V - Wrist 11  L Ulnar - Orthodromic, (Dig V, Mid palm)     Dig V Wrist 2.4 ?3.1 14 ?5 Dig V - Wrist 68             F  Wave    Nerve F Lat Ref.   ms ms  R Ulnar - ADM 27.4 ?32.0  L Ulnar - ADM 26.9 ?32.0         EMG Summary Table    Spontaneous MUAP Recruitment  Muscle IA Fib PSW Fasc Other Amp Dur.  Poly Pattern  L. Deltoid Normal None None None _______ Normal Normal Normal Normal  L. Triceps brachii Normal None None None _______ Increased Increased 1+ Reduced  L. Biceps brachii Normal None None None _______ Normal Normal 1+ Normal  L. Extensor digitorum communis Normal None None None _______ Normal Normal Normal Normal  L. Flexor carpi ulnaris Normal None None None _______ Normal Increased 1+ Reduced  L. First dorsal interosseous Normal None None None _______ Normal Normal Normal Normal  R. First dorsal interosseous Normal None None None _______ Normal Normal Normal Normal  R. Abductor pollicis brevis Normal None None None _______ Normal Normal Normal Normal  L. Abductor pollicis brevis Normal None None None _______ Normal Normal Normal Normal

## 2019-10-30 DIAGNOSIS — F4312 Post-traumatic stress disorder, chronic: Secondary | ICD-10-CM | POA: Diagnosis not present

## 2019-11-06 DIAGNOSIS — F4312 Post-traumatic stress disorder, chronic: Secondary | ICD-10-CM | POA: Diagnosis not present

## 2019-11-11 ENCOUNTER — Encounter (HOSPITAL_COMMUNITY): Payer: Self-pay | Admitting: *Deleted

## 2019-11-11 ENCOUNTER — Emergency Department (HOSPITAL_COMMUNITY): Payer: BC Managed Care – PPO

## 2019-11-11 ENCOUNTER — Other Ambulatory Visit: Payer: Self-pay

## 2019-11-11 ENCOUNTER — Observation Stay (HOSPITAL_COMMUNITY)
Admission: EM | Admit: 2019-11-11 | Discharge: 2019-11-12 | Disposition: A | Discharge status: Home / Self Care | Payer: BC Managed Care – PPO | Attending: Internal Medicine | Admitting: Internal Medicine

## 2019-11-11 DIAGNOSIS — Z87891 Personal history of nicotine dependence: Secondary | ICD-10-CM | POA: Diagnosis not present

## 2019-11-11 DIAGNOSIS — S066X0D Traumatic subarachnoid hemorrhage without loss of consciousness, subsequent encounter: Secondary | ICD-10-CM

## 2019-11-11 DIAGNOSIS — I10 Essential (primary) hypertension: Secondary | ICD-10-CM | POA: Insufficient documentation

## 2019-11-11 DIAGNOSIS — E6609 Other obesity due to excess calories: Secondary | ICD-10-CM

## 2019-11-11 DIAGNOSIS — G4730 Sleep apnea, unspecified: Secondary | ICD-10-CM | POA: Diagnosis not present

## 2019-11-11 DIAGNOSIS — Z20822 Contact with and (suspected) exposure to covid-19: Secondary | ICD-10-CM | POA: Insufficient documentation

## 2019-11-11 DIAGNOSIS — R569 Unspecified convulsions: Secondary | ICD-10-CM

## 2019-11-11 DIAGNOSIS — G43909 Migraine, unspecified, not intractable, without status migrainosus: Secondary | ICD-10-CM | POA: Diagnosis not present

## 2019-11-11 DIAGNOSIS — F329 Major depressive disorder, single episode, unspecified: Secondary | ICD-10-CM | POA: Insufficient documentation

## 2019-11-11 DIAGNOSIS — Y93I9 Activity, other involving external motion: Secondary | ICD-10-CM | POA: Diagnosis not present

## 2019-11-11 DIAGNOSIS — S8992XA Unspecified injury of left lower leg, initial encounter: Secondary | ICD-10-CM | POA: Diagnosis not present

## 2019-11-11 DIAGNOSIS — F4310 Post-traumatic stress disorder, unspecified: Secondary | ICD-10-CM | POA: Diagnosis not present

## 2019-11-11 DIAGNOSIS — S066X0A Traumatic subarachnoid hemorrhage without loss of consciousness, initial encounter: Principal | ICD-10-CM | POA: Insufficient documentation

## 2019-11-11 DIAGNOSIS — E119 Type 2 diabetes mellitus without complications: Secondary | ICD-10-CM

## 2019-11-11 DIAGNOSIS — Y92410 Unspecified street and highway as the place of occurrence of the external cause: Secondary | ICD-10-CM | POA: Insufficient documentation

## 2019-11-11 DIAGNOSIS — Z79899 Other long term (current) drug therapy: Secondary | ICD-10-CM | POA: Insufficient documentation

## 2019-11-11 DIAGNOSIS — Y999 Unspecified external cause status: Secondary | ICD-10-CM | POA: Diagnosis not present

## 2019-11-11 DIAGNOSIS — Z041 Encounter for examination and observation following transport accident: Secondary | ICD-10-CM | POA: Diagnosis not present

## 2019-11-11 DIAGNOSIS — I2510 Atherosclerotic heart disease of native coronary artery without angina pectoris: Secondary | ICD-10-CM | POA: Diagnosis not present

## 2019-11-11 DIAGNOSIS — M25562 Pain in left knee: Secondary | ICD-10-CM | POA: Diagnosis not present

## 2019-11-11 DIAGNOSIS — S066XAA Traumatic subarachnoid hemorrhage with loss of consciousness status unknown, initial encounter: Secondary | ICD-10-CM | POA: Diagnosis present

## 2019-11-11 DIAGNOSIS — R519 Headache, unspecified: Secondary | ICD-10-CM | POA: Diagnosis not present

## 2019-11-11 DIAGNOSIS — F3181 Bipolar II disorder: Secondary | ICD-10-CM | POA: Diagnosis present

## 2019-11-11 DIAGNOSIS — Z6834 Body mass index (BMI) 34.0-34.9, adult: Secondary | ICD-10-CM

## 2019-11-11 DIAGNOSIS — R079 Chest pain, unspecified: Secondary | ICD-10-CM | POA: Diagnosis not present

## 2019-11-11 DIAGNOSIS — G43009 Migraine without aura, not intractable, without status migrainosus: Secondary | ICD-10-CM

## 2019-11-11 LAB — CBC WITH DIFFERENTIAL/PLATELET
Abs Immature Granulocytes: 0.03 K/uL (ref 0.00–0.07)
Basophils Absolute: 0.1 K/uL (ref 0.0–0.1)
Basophils Relative: 1 %
Eosinophils Absolute: 0.3 K/uL (ref 0.0–0.5)
Eosinophils Relative: 3 %
HCT: 44 % (ref 36.0–46.0)
Hemoglobin: 14.5 g/dL (ref 12.0–15.0)
Immature Granulocytes: 0 %
Lymphocytes Relative: 17 %
Lymphs Abs: 1.5 K/uL (ref 0.7–4.0)
MCH: 29 pg (ref 26.0–34.0)
MCHC: 33 g/dL (ref 30.0–36.0)
MCV: 88 fL (ref 80.0–100.0)
Monocytes Absolute: 0.4 K/uL (ref 0.1–1.0)
Monocytes Relative: 4 %
Neutro Abs: 6.2 K/uL (ref 1.7–7.7)
Neutrophils Relative %: 75 %
Platelets: 308 K/uL (ref 150–400)
RBC: 5 MIL/uL (ref 3.87–5.11)
RDW: 13.9 % (ref 11.5–15.5)
WBC: 8.4 K/uL (ref 4.0–10.5)
nRBC: 0 % (ref 0.0–0.2)

## 2019-11-11 LAB — BASIC METABOLIC PANEL WITH GFR
Anion gap: 8 (ref 5–15)
BUN: 9 mg/dL (ref 6–20)
CO2: 27 mmol/L (ref 22–32)
Calcium: 8.7 mg/dL — ABNORMAL LOW (ref 8.9–10.3)
Chloride: 99 mmol/L (ref 98–111)
Creatinine, Ser: 0.56 mg/dL (ref 0.44–1.00)
GFR calc Af Amer: >60 mL/min
GFR calc non Af Amer: >60 mL/min
Glucose, Bld: 175 mg/dL — ABNORMAL HIGH (ref 70–99)
Potassium: 3.5 mmol/L (ref 3.5–5.1)
Sodium: 134 mmol/L — ABNORMAL LOW (ref 135–145)

## 2019-11-11 LAB — MAGNESIUM: Magnesium: 2.2 mg/dL (ref 1.7–2.4)

## 2019-11-11 LAB — PHOSPHORUS: Phosphorus: 2.9 mg/dL (ref 2.5–4.6)

## 2019-11-11 LAB — SARS CORONAVIRUS 2 BY RT PCR (HOSPITAL ORDER, PERFORMED IN ~~LOC~~ HOSPITAL LAB): SARS Coronavirus 2: NEGATIVE

## 2019-11-11 LAB — TSH: TSH: 1.593 u[IU]/mL (ref 0.350–4.500)

## 2019-11-11 MED ORDER — AMLODIPINE BESYLATE 5 MG PO TABS
2.5000 mg | ORAL_TABLET | Freq: Every day | ORAL | Status: DC
  Administered 2019-11-11: 2.5 mg via ORAL
  Filled 2019-11-11: qty 1

## 2019-11-11 MED ORDER — ACETAMINOPHEN 650 MG RE SUPP: 650.0000 mg | Freq: Four times a day (QID) | RECTAL | Status: DC | PRN

## 2019-11-11 MED ORDER — METHOCARBAMOL 1000 MG/10ML IJ SOLN
500.0000 mg | Freq: Three times a day (TID) | INTRAVENOUS | Status: DC | PRN
  Filled 2019-11-11: qty 5

## 2019-11-11 MED ORDER — INSULIN ASPART 100 UNIT/ML ~~LOC~~ SOLN: 0.0000 [IU] | Freq: Every day | SUBCUTANEOUS | Status: DC

## 2019-11-11 MED ORDER — HYDROMORPHONE HCL 1 MG/ML IJ SOLN
1.0000 mg | Freq: Once | INTRAMUSCULAR | Status: AC
  Administered 2019-11-11: 1 mg via INTRAMUSCULAR
  Filled 2019-11-11: qty 1

## 2019-11-11 MED ORDER — PROCHLORPERAZINE EDISYLATE 10 MG/2ML IJ SOLN: 10.0000 mg | Freq: Four times a day (QID) | INTRAMUSCULAR | Status: DC | PRN

## 2019-11-11 MED ORDER — QUETIAPINE FUMARATE 25 MG PO TABS
25.0000 mg | ORAL_TABLET | Freq: Every day | ORAL | Status: DC
  Administered 2019-11-11: 25 mg via ORAL
  Filled 2019-11-11: qty 1

## 2019-11-11 MED ORDER — SODIUM CHLORIDE 0.9 % IV SOLN: INTRAVENOUS | Status: AC

## 2019-11-11 MED ORDER — INSULIN ASPART 100 UNIT/ML ~~LOC~~ SOLN
0.0000 [IU] | Freq: Three times a day (TID) | SUBCUTANEOUS | Status: DC
  Administered 2019-11-12 (×2): 2 [IU] via SUBCUTANEOUS

## 2019-11-11 MED ORDER — LEVETIRACETAM 500 MG PO TABS
500.0000 mg | ORAL_TABLET | Freq: Two times a day (BID) | ORAL | Status: DC
  Administered 2019-11-11 – 2019-11-12 (×2): 500 mg via ORAL
  Filled 2019-11-11 (×3): qty 1

## 2019-11-11 MED ORDER — METOCLOPRAMIDE HCL 10 MG PO TABS
10.0000 mg | ORAL_TABLET | Freq: Once | ORAL | Status: AC
  Administered 2019-11-11: 10 mg via ORAL
  Filled 2019-11-11: qty 1

## 2019-11-11 MED ORDER — SUMATRIPTAN SUCCINATE 50 MG PO TABS
50.0000 mg | ORAL_TABLET | ORAL | Status: DC | PRN
  Filled 2019-11-11: qty 1

## 2019-11-11 MED ORDER — UBROGEPANT 50 MG PO TABS: 1.0000 | ORAL_TABLET | Freq: Every day | ORAL | Status: DC

## 2019-11-11 MED ORDER — OXYCODONE HCL 5 MG PO TABS: 5.0000 mg | ORAL_TABLET | Freq: Four times a day (QID) | ORAL | Status: DC | PRN

## 2019-11-11 MED ORDER — DIAZEPAM 5 MG PO TABS: 5.0000 mg | ORAL_TABLET | Freq: Two times a day (BID) | ORAL | Status: DC | PRN

## 2019-11-11 MED ORDER — HYDROMORPHONE HCL 1 MG/ML IJ SOLN
1.0000 mg | INTRAMUSCULAR | Status: DC | PRN
  Administered 2019-11-11 – 2019-11-12 (×3): 1 mg via INTRAVENOUS
  Filled 2019-11-11 (×2): qty 1

## 2019-11-11 MED ORDER — OXYCODONE-ACETAMINOPHEN 5-325 MG PO TABS
1.0000 | ORAL_TABLET | Freq: Once | ORAL | Status: AC
  Administered 2019-11-11: 1 via ORAL
  Filled 2019-11-11: qty 1

## 2019-11-11 MED ORDER — HYDROMORPHONE HCL 1 MG/ML IJ SOLN
1.0000 mg | Freq: Once | INTRAMUSCULAR | Status: DC
  Filled 2019-11-11: qty 1

## 2019-11-11 MED ORDER — ACETAMINOPHEN 325 MG PO TABS
650.0000 mg | ORAL_TABLET | Freq: Four times a day (QID) | ORAL | Status: DC | PRN
  Filled 2019-11-11: qty 2

## 2019-11-11 MED ORDER — SERTRALINE HCL 50 MG PO TABS
200.0000 mg | ORAL_TABLET | Freq: Every day | ORAL | Status: DC
  Administered 2019-11-12: 200 mg via ORAL
  Filled 2019-11-11: qty 4

## 2019-11-11 MED ORDER — AMLODIPINE BESYLATE 5 MG PO TABS
5.0000 mg | ORAL_TABLET | Freq: Every day | ORAL | Status: DC
  Administered 2019-11-12: 5 mg via ORAL
  Filled 2019-11-11: qty 1

## 2019-11-11 NOTE — ED Notes (Signed)
Pt transported to xray 
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
## 2019-11-12 ENCOUNTER — Observation Stay (HOSPITAL_COMMUNITY): Payer: BC Managed Care – PPO

## 2019-11-12 DIAGNOSIS — S066X0D Traumatic subarachnoid hemorrhage without loss of consciousness, subsequent encounter: Secondary | ICD-10-CM

## 2019-11-12 DIAGNOSIS — G43909 Migraine, unspecified, not intractable, without status migrainosus: Secondary | ICD-10-CM | POA: Diagnosis not present

## 2019-11-12 DIAGNOSIS — F4310 Post-traumatic stress disorder, unspecified: Secondary | ICD-10-CM | POA: Diagnosis not present

## 2019-11-12 DIAGNOSIS — F329 Major depressive disorder, single episode, unspecified: Secondary | ICD-10-CM | POA: Diagnosis not present

## 2019-11-12 DIAGNOSIS — G4730 Sleep apnea, unspecified: Secondary | ICD-10-CM | POA: Diagnosis not present

## 2019-11-12 DIAGNOSIS — I10 Essential (primary) hypertension: Secondary | ICD-10-CM | POA: Diagnosis not present

## 2019-11-12 DIAGNOSIS — Z79899 Other long term (current) drug therapy: Secondary | ICD-10-CM | POA: Diagnosis not present

## 2019-11-12 DIAGNOSIS — S066X0A Traumatic subarachnoid hemorrhage without loss of consciousness, initial encounter: Secondary | ICD-10-CM | POA: Diagnosis not present

## 2019-11-12 DIAGNOSIS — Y999 Unspecified external cause status: Secondary | ICD-10-CM | POA: Diagnosis not present

## 2019-11-12 DIAGNOSIS — E119 Type 2 diabetes mellitus without complications: Secondary | ICD-10-CM

## 2019-11-12 DIAGNOSIS — I2510 Atherosclerotic heart disease of native coronary artery without angina pectoris: Secondary | ICD-10-CM | POA: Diagnosis not present

## 2019-11-12 DIAGNOSIS — Z20822 Contact with and (suspected) exposure to covid-19: Secondary | ICD-10-CM | POA: Diagnosis not present

## 2019-11-12 DIAGNOSIS — Z87891 Personal history of nicotine dependence: Secondary | ICD-10-CM | POA: Diagnosis not present

## 2019-11-12 DIAGNOSIS — Y92410 Unspecified street and highway as the place of occurrence of the external cause: Secondary | ICD-10-CM | POA: Diagnosis not present

## 2019-11-12 LAB — COMPREHENSIVE METABOLIC PANEL
ALT: 47 U/L — ABNORMAL HIGH (ref 0–44)
AST: 44 U/L — ABNORMAL HIGH (ref 15–41)
Albumin: 3.6 g/dL (ref 3.5–5.0)
Alkaline Phosphatase: 117 U/L (ref 38–126)
Anion gap: 11 (ref 5–15)
BUN: 12 mg/dL (ref 6–20)
CO2: 30 mmol/L (ref 22–32)
Calcium: 8.5 mg/dL — ABNORMAL LOW (ref 8.9–10.3)
Chloride: 100 mmol/L (ref 98–111)
Creatinine, Ser: 0.69 mg/dL (ref 0.44–1.00)
GFR calc Af Amer: >60 mL/min (ref >60)
GFR calc non Af Amer: >60 mL/min (ref >60)
Glucose, Bld: 182 mg/dL — ABNORMAL HIGH (ref 70–99)
Potassium: 3.7 mmol/L (ref 3.5–5.1)
Sodium: 141 mmol/L (ref 135–145)
Total Bilirubin: 0.8 mg/dL (ref 0.3–1.2)
Total Protein: 6.7 g/dL (ref 6.5–8.1)

## 2019-11-12 LAB — CBC
HCT: 40.7 % (ref 36.0–46.0)
Hemoglobin: 12.8 g/dL (ref 12.0–15.0)
MCH: 28.8 pg (ref 26.0–34.0)
MCHC: 31.4 g/dL (ref 30.0–36.0)
MCV: 91.7 fL (ref 80.0–100.0)
Platelets: 256 10*3/uL (ref 150–400)
RBC: 4.44 MIL/uL (ref 3.87–5.11)
RDW: 14 % (ref 11.5–15.5)
WBC: 6.8 10*3/uL (ref 4.0–10.5)
nRBC: 0 % (ref 0.0–0.2)

## 2019-11-12 LAB — HEMOGLOBIN A1C
Hgb A1c MFr Bld: 7.5 % — ABNORMAL HIGH (ref 4.8–5.6)
Mean Plasma Glucose: 168.55 mg/dL

## 2019-11-12 LAB — GLUCOSE, CAPILLARY
Glucose-Capillary: 159 mg/dL — ABNORMAL HIGH (ref 70–99)
Glucose-Capillary: 186 mg/dL — ABNORMAL HIGH (ref 70–99)

## 2019-11-12 LAB — HIV ANTIBODY (ROUTINE TESTING W REFLEX): HIV Screen 4th Generation wRfx: NONREACTIVE

## 2019-11-12 MED ORDER — METFORMIN HCL 500 MG PO TABS: 500.0000 mg | ORAL_TABLET | Freq: Two times a day (BID) | ORAL | 0 refills | Status: DC

## 2019-11-12 MED ORDER — LEVETIRACETAM 500 MG PO TABS: 500.0000 mg | ORAL_TABLET | Freq: Two times a day (BID) | ORAL | 0 refills | Status: DC

## 2019-11-12 NOTE — Progress Notes (Signed)
  NEUROSURGERY PROGRESS NOTE   Repeat head CT reviewed and compared to prior. Previously noted right frontal SAH is unchanged. She can be discharged home. She will need to complete 7 day course of keppra for routine seizure prophylaxis. No outpatient f/u necessary. Please call for any concerns.

## 2019-11-12 NOTE — Discharge Instructions (Signed)
Subarachnoid Hemorrhage  Subarachnoid hemorrhage is bleeding between the brain and the layer that covers the brain. The bleeding puts pressure on the brain, and it stops blood from going to some areas of the brain. If this bleeding is not treated, it may cause brain damage, stroke, or death. This is an emergency. You must be treated in the hospital right away. You are more likely to get this condition if you:  Smoke.  Have high blood pressure.  Drink too much alcohol.  Are older than age 52.  Are female, especially if you have stopped getting your period for a year or longer (menopause).  Have a family history of burst blood vessels (aneurysms).  Have a certain syndrome that leads to one of these: ? Kidney disease. ? Disease of tissues like bones, blood, and fat (connective tissues). Signs of this bleeding condition include:  Sudden, very bad headache. It may feel like the worst headache you have ever had.  Feeling sick to your stomach (nausea) or throwing up (vomiting), especially if you have other signs such as a headache.  Sudden weakness or loss of feeling (numbness) in your face, arm, or leg, especially on one side of the body.  Sudden trouble with any of these: ? Walking. ? Moving an arm or leg. ? Talking. ? Understanding what people say. ? Swallowing. ? Seeing out of one eye or both eyes.  Sudden confusion.  Seeing double.  Loss of balance.  Sensitivity to light.  Stiff neck.  Trouble staying awake.  Passing out (fainting). Follow these instructions at home: Medicines  Take over-the-counter and prescription medicines only as told by your doctor.  Do not take any medicines that contain aspirin or NSAIDs (like ibuprofen) unless your doctor says that it is safe to take them. Lifestyle  Do not use any products that have nicotine or tobacco. These include cigarettes and e-cigarettes. If you need help quitting, ask your doctor.  Limit alcohol to 1 drink a  day for nonpregnant women and 2 drinks a day for men. One drink is equal to: ? 12 oz of beer. ? 5 oz of wine. ? 1 oz of hard liquor. Eating and drinking  Ask your doctor if it is safe for you to eat and drink. You may need tests to make sure that you can swallow safely (swallow studies). Driving  Do not drive until your doctor says that it is safe to drive.  Do not drive or use heavy machinery while taking prescription pain medicine. General instructions  Do therapy as recommended. This may include: ? Physical therapy (PT). ? Occupational therapy (OT). ? Speech-language therapy.  Rest and limit activity as told by your doctor. Rest helps your brain to heal. Make sure you: ? Get plenty of sleep. ? Avoid activities that cause stress to your body or mind.  Check your blood pressure as told by your doctor. Write down your blood pressure.  Keep all follow-up visits as told by your doctors. This is important. Contact a doctor if:  You have a stiff neck.  You have a cough.  You have a fever. Get help right away if:  You have any signs of a stroke. "BE FAST" is an easy way to remember the main warning signs: ? B - Balance. Signs are dizziness, sudden trouble walking, or loss of balance. ? E - Eyes. Signs are trouble seeing or a sudden change in how you see. ? F - Face. Signs are sudden weakness or loss  of feeling of the face, or the face or eyelid drooping on one side. ? A - Arms. Signs are weakness or loss of feeling in an arm. This happens suddenly and usually on one side of the body. ? S - Speech. Signs are sudden trouble speaking, slurred speech, or trouble understanding what people say. ? T - Time. Time to call emergency services. Write down what time symptoms started.  You have other signs of a stroke, such as: ? A sudden, very bad headache with no known cause. ? Feeling sick to your stomach. ? Throwing up. ? Jerky movements you cannot control (seizure). These symptoms  may be an emergency. Do not wait to see if the symptoms will go away. Get medical help right away. Call your local emergency services (911 in the [place] Do not drive yourself to the hospital. Summary  Subarachnoid hemorrhage is bleeding in the brain. It is an emergency. You must be treated in the hospital right away.  Follow instructions from your doctor about eating, resting, and taking medicines.  Do not take any medicines that contain aspirin or NSAIDs (like ibuprofen) unless your doctor says that it is safe to take them. This information is not intended to replace advice given to you by your health care provider. Make sure you discuss any questions you have with your health care provider. Document Revised: 02/02/2017 Document Reviewed: 11/30/2016 Elsevier Patient Education  2020 [employer].

## 2019-11-12 NOTE — Progress Notes (Signed)
Head ache complaints today and received pain medication.  CT of head unchanged from yesterday.  No seizure activity.  Husband has been at bedside and brought breakfast and lunch.  IV removed and discharge instructions reviewed.  Scripts sent to pharmacy.  Husband to drive home
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
## 2019-11-13 DIAGNOSIS — F4312 Post-traumatic stress disorder, chronic: Secondary | ICD-10-CM | POA: Diagnosis not present

## 2019-11-15 ENCOUNTER — Emergency Department (HOSPITAL_COMMUNITY)
Admission: EM | Admit: 2019-11-15 | Discharge: 2019-11-16 | Disposition: A | Discharge status: Home / Self Care | Payer: BC Managed Care – PPO | Attending: Emergency Medicine | Admitting: Emergency Medicine

## 2019-11-15 ENCOUNTER — Emergency Department (HOSPITAL_COMMUNITY): Payer: BC Managed Care – PPO

## 2019-11-15 ENCOUNTER — Other Ambulatory Visit: Payer: Self-pay

## 2019-11-15 ENCOUNTER — Encounter (HOSPITAL_COMMUNITY): Payer: Self-pay

## 2019-11-15 DIAGNOSIS — G44309 Post-traumatic headache, unspecified, not intractable: Secondary | ICD-10-CM | POA: Diagnosis not present

## 2019-11-15 DIAGNOSIS — Z7984 Long term (current) use of oral hypoglycemic drugs: Secondary | ICD-10-CM | POA: Diagnosis not present

## 2019-11-15 DIAGNOSIS — F0781 Postconcussional syndrome: Secondary | ICD-10-CM | POA: Diagnosis not present

## 2019-11-15 DIAGNOSIS — I10 Essential (primary) hypertension: Secondary | ICD-10-CM | POA: Diagnosis not present

## 2019-11-15 DIAGNOSIS — Z79899 Other long term (current) drug therapy: Secondary | ICD-10-CM | POA: Insufficient documentation

## 2019-11-15 DIAGNOSIS — Z87891 Personal history of nicotine dependence: Secondary | ICD-10-CM | POA: Insufficient documentation

## 2019-11-15 DIAGNOSIS — E119 Type 2 diabetes mellitus without complications: Secondary | ICD-10-CM | POA: Insufficient documentation

## 2019-11-15 DIAGNOSIS — Z8669 Personal history of other diseases of the nervous system and sense organs: Secondary | ICD-10-CM | POA: Diagnosis not present

## 2019-11-15 DIAGNOSIS — R519 Headache, unspecified: Secondary | ICD-10-CM | POA: Diagnosis not present

## 2019-11-15 MED ORDER — HYDROMORPHONE HCL 1 MG/ML IJ SOLN
1.0000 mg | Freq: Once | INTRAMUSCULAR | Status: AC
  Administered 2019-11-16: 1 mg via INTRAVENOUS
  Filled 2019-11-15: qty 1

## 2019-11-15 MED ORDER — SODIUM CHLORIDE 0.9 % IV SOLN: INTRAVENOUS | Status: DC

## 2019-11-15 MED ORDER — METOCLOPRAMIDE HCL 5 MG/ML IJ SOLN
10.0000 mg | Freq: Once | INTRAMUSCULAR | Status: AC
  Administered 2019-11-15: 10 mg via INTRAVENOUS
  Filled 2019-11-15: qty 2

## 2019-11-15 MED ORDER — DIPHENHYDRAMINE HCL 50 MG/ML IJ SOLN
12.5000 mg | Freq: Once | INTRAMUSCULAR | Status: AC
  Administered 2019-11-15: 12.5 mg via INTRAVENOUS
  Filled 2019-11-15: qty 1

## 2019-11-15 MED ORDER — PROMETHAZINE HCL 25 MG PO TABS: 25.0000 mg | ORAL_TABLET | Freq: Four times a day (QID) | ORAL | 0 refills | Status: DC | PRN

## 2019-11-15 MED ORDER — HYDROCODONE-ACETAMINOPHEN 5-325 MG PO TABS: 1.0000 | ORAL_TABLET | ORAL | 0 refills | Status: DC | PRN

## 2019-11-15 MED ORDER — MORPHINE SULFATE (PF) 4 MG/ML IV SOLN
4.0000 mg | Freq: Once | INTRAVENOUS | Status: AC
  Administered 2019-11-15: 4 mg via INTRAVENOUS
  Filled 2019-11-15: qty 1

## 2019-11-15 MED ORDER — SODIUM CHLORIDE 0.9 % IV BOLUS
1000.0000 mL | Freq: Once | INTRAVENOUS | Status: AC
  Administered 2019-11-15: 1000 mL via INTRAVENOUS
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
## 2019-11-15 NOTE — ED Notes (Signed)
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

## 2019-11-15 NOTE — ED Notes (Signed)
To CT

## 2019-11-16 LAB — CBC
HCT: 40.6 % (ref 36.0–46.0)
Hemoglobin: 13.2 g/dL (ref 12.0–15.0)
MCH: 28.9 pg (ref 26.0–34.0)
MCHC: 32.5 g/dL (ref 30.0–36.0)
MCV: 88.8 fL (ref 80.0–100.0)
Platelets: 312 10*3/uL (ref 150–400)
RBC: 4.57 MIL/uL (ref 3.87–5.11)
RDW: 13.9 % (ref 11.5–15.5)
WBC: 8.2 10*3/uL (ref 4.0–10.5)
nRBC: 0 % (ref 0.0–0.2)

## 2019-11-16 LAB — BASIC METABOLIC PANEL
Anion gap: 11 (ref 5–15)
BUN: 13 mg/dL (ref 6–20)
CO2: 28 mmol/L (ref 22–32)
Calcium: 9.5 mg/dL (ref 8.9–10.3)
Chloride: 100 mmol/L (ref 98–111)
Creatinine, Ser: 0.57 mg/dL (ref 0.44–1.00)
GFR calc Af Amer: >60 mL/min (ref >60)
GFR calc non Af Amer: >60 mL/min (ref >60)
Glucose, Bld: 128 mg/dL — ABNORMAL HIGH (ref 70–99)
Potassium: 3.2 mmol/L — ABNORMAL LOW (ref 3.5–5.1)
Sodium: 139 mmol/L (ref 135–145)

## 2019-11-17 ENCOUNTER — Ambulatory Visit (INDEPENDENT_AMBULATORY_CARE_PROVIDER_SITE_OTHER): Payer: BC Managed Care – PPO | Admitting: Family Medicine

## 2019-11-17 ENCOUNTER — Encounter: Payer: Self-pay | Admitting: Family Medicine

## 2019-11-17 ENCOUNTER — Other Ambulatory Visit: Payer: Self-pay

## 2019-11-17 VITALS — BP 126/80 | HR 90 | Resp 16 | Ht 67.0 in | Wt 221.0 lb

## 2019-11-17 DIAGNOSIS — S066X0D Traumatic subarachnoid hemorrhage without loss of consciousness, subsequent encounter: Secondary | ICD-10-CM

## 2019-11-17 DIAGNOSIS — R911 Solitary pulmonary nodule: Secondary | ICD-10-CM

## 2019-11-17 DIAGNOSIS — E119 Type 2 diabetes mellitus without complications: Secondary | ICD-10-CM | POA: Diagnosis not present

## 2019-11-17 MED ORDER — HYDROCODONE-ACETAMINOPHEN 5-325 MG PO TABS
1.0000 | ORAL_TABLET | ORAL | 0 refills | Status: DC | PRN
Start: 2019-11-17 — End: 2021-06-24

## 2019-11-17 MED ORDER — PROMETHAZINE HCL 25 MG PO TABS: 25.0000 mg | ORAL_TABLET | Freq: Four times a day (QID) | ORAL | 0 refills | Status: DC | PRN
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
## 2019-11-20 ENCOUNTER — Encounter: Payer: Self-pay | Admitting: Family Medicine

## 2019-11-20 DIAGNOSIS — F4312 Post-traumatic stress disorder, chronic: Secondary | ICD-10-CM | POA: Diagnosis not present

## 2019-11-25 ENCOUNTER — Other Ambulatory Visit: Payer: Self-pay | Admitting: Family Medicine

## 2019-11-25 DIAGNOSIS — S066X0D Traumatic subarachnoid hemorrhage without loss of consciousness, subsequent encounter: Secondary | ICD-10-CM

## 2019-11-25 DIAGNOSIS — G40909 Epilepsy, unspecified, not intractable, without status epilepticus: Secondary | ICD-10-CM

## 2019-12-01 ENCOUNTER — Telehealth (INDEPENDENT_AMBULATORY_CARE_PROVIDER_SITE_OTHER): Payer: BC Managed Care – PPO | Admitting: Family Medicine

## 2019-12-01 DIAGNOSIS — S066X0D Traumatic subarachnoid hemorrhage without loss of consciousness, subsequent encounter: Secondary | ICD-10-CM | POA: Diagnosis not present

## 2019-12-01 DIAGNOSIS — F0781 Postconcussional syndrome: Secondary | ICD-10-CM | POA: Diagnosis not present

## 2019-12-01 MED ORDER — GABAPENTIN 300 MG PO CAPS: 300.0000 mg | ORAL_CAPSULE | Freq: Three times a day (TID) | ORAL | 3 refills | Status: DC | PRN

## 2019-12-01 MED ORDER — BLOOD GLUCOSE TEST VI STRP: ORAL_STRIP | 3 refills | Status: DC

## 2019-12-01 MED ORDER — BLOOD GLUCOSE SYSTEM PAK KIT: PACK | 1 refills | Status: DC

## 2019-12-01 MED ORDER — LANCETS MISC: 0 refills | Status: AC
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
## 2019-12-01 NOTE — Addendum Note (Signed)
Addended by: Sheral Flow on: 12/01/2019 03:05 PM   Modules accepted: Orders

## 2019-12-05 ENCOUNTER — Other Ambulatory Visit: Payer: Self-pay | Admitting: Infectious Diseases

## 2019-12-05 ENCOUNTER — Other Ambulatory Visit: Payer: BC Managed Care – PPO

## 2019-12-05 ENCOUNTER — Encounter: Payer: Self-pay | Admitting: Family Medicine

## 2019-12-05 ENCOUNTER — Other Ambulatory Visit: Payer: Self-pay

## 2019-12-05 ENCOUNTER — Telehealth: Payer: Self-pay | Admitting: Infectious Diseases

## 2019-12-05 DIAGNOSIS — Z20822 Contact with and (suspected) exposure to covid-19: Secondary | ICD-10-CM

## 2019-12-05 DIAGNOSIS — E119 Type 2 diabetes mellitus without complications: Secondary | ICD-10-CM

## 2019-12-05 DIAGNOSIS — U071 COVID-19: Secondary | ICD-10-CM

## 2019-12-05 DIAGNOSIS — I10 Essential (primary) hypertension: Secondary | ICD-10-CM

## 2019-12-05 NOTE — Progress Notes (Signed)
I connected by phone with [name] on 12/05/2019 at 10:38 AM to discuss the potential use of a new treatment for mild to moderate COVID-19 viral infection in non-hospitalized patients.  This patient is a 54 y.o. female that meets the FDA criteria for Emergency Use Authorization of COVID monoclonal antibody casirivimab/imdevimab or bamlanivimab/eteseviamb.  Has a (+) direct SARS-CoV-2 viral test result  Has mild or moderate COVID-19   Is NOT hospitalized due to COVID-19  Is within 10 days of symptom onset  Has at least one of the high risk factor(s) for progression to severe COVID-19 and/or hospitalization as defined in EUA.  Specific high risk criteria : BMI > 25, Diabetes and Cardiovascular disease or hypertension   I have spoken and communicated the following to the patient or parent/caregiver regarding COVID monoclonal antibody treatment:  1. FDA has authorized the emergency use for the treatment of mild to moderate COVID-19 in adults and pediatric patients with positive results of direct SARS-CoV-2 viral testing who are 30 years of age and older weighing at least 40 kg, and who are at high risk for progressing to severe COVID-19 and/or hospitalization.  2. The significant known and potential risks and benefits of COVID monoclonal antibody, and the extent to which such potential risks and benefits are unknown.  3. Information on available alternative treatments and the risks and benefits of those alternatives, including clinical trials.  4. Patients treated with COVID monoclonal antibody should continue to self-isolate and use infection control measures (e.g., wear mask, isolate, social distance, avoid sharing personal items, clean and disinfect "high touch" surfaces, and frequent handwashing) according to CDC guidelines.   5. The patient or parent/caregiver has the option to accept or refuse COVID monoclonal antibody treatment.  After reviewing this information with the  patient, the patient has agreed to receive one of the available covid 19 monoclonal antibodies and will be provided an appropriate fact sheet prior to infusion. [name], NP 12/05/2019 10:38 AM

## 2019-12-05 NOTE — Telephone Encounter (Signed)
Called to Discuss with patient about Covid symptoms and the use of the monoclonal antibody infusion for those with mild to moderate Covid symptoms and at a high risk of hospitalization.     Pt appears to qualify for this infusion due to co-morbid conditions and/or a member of an at-risk group in accordance with the FDA Emergency Use Authorization.    I spoke with patient's husband as he came up on our positive Covid test algorithm.  She is actually more symptomatic than he is with more risk factors including diabetes, obesity, hypertension, multiple others. Symptoms for her started on Tuesday and are more respiratory driven.  She has a appointment to get a test this evening.  I offered them treatment today on presumption that she is high risk positive given she is staying in the same home with her husband and with classic symptoms.  They decided to schedule Saturday morning at 830 so they can come together.  If results are available she will bring them.  Unable to find at home test from pharmacy.    [name], MSN, NP-C [facility] [email or web address] Pager: [phone] Office: [phone] [place]: [phone]

## 2019-12-06 ENCOUNTER — Ambulatory Visit (HOSPITAL_COMMUNITY)
Admission: RE | Admit: 2019-12-06 | Discharge: 2019-12-06 | Disposition: A | Discharge status: Home / Self Care | Payer: BC Managed Care – PPO | Source: Ambulatory Visit | Attending: Pulmonary Disease | Admitting: Pulmonary Disease

## 2019-12-06 DIAGNOSIS — U071 COVID-19: Secondary | ICD-10-CM | POA: Insufficient documentation

## 2019-12-06 DIAGNOSIS — E119 Type 2 diabetes mellitus without complications: Secondary | ICD-10-CM | POA: Diagnosis not present

## 2019-12-06 DIAGNOSIS — I10 Essential (primary) hypertension: Secondary | ICD-10-CM | POA: Diagnosis not present

## 2019-12-06 MED ORDER — METHYLPREDNISOLONE SODIUM SUCC 125 MG IJ SOLR: 125.0000 mg | Freq: Once | INTRAMUSCULAR | Status: DC | PRN

## 2019-12-06 MED ORDER — SODIUM CHLORIDE 0.9 % IV SOLN
1200.0000 mg | Freq: Once | INTRAVENOUS | Status: AC
  Administered 2019-12-06: 1200 mg via INTRAVENOUS

## 2019-12-06 MED ORDER — FAMOTIDINE IN NACL 20-0.9 MG/50ML-% IV SOLN: 20.0000 mg | Freq: Once | INTRAVENOUS | Status: DC | PRN

## 2019-12-06 MED ORDER — ALBUTEROL SULFATE HFA 108 (90 BASE) MCG/ACT IN AERS: 2.0000 | INHALATION_SPRAY | Freq: Once | RESPIRATORY_TRACT | Status: DC | PRN

## 2019-12-06 MED ORDER — ACETAMINOPHEN 325 MG PO TABS: 650.0000 mg | ORAL_TABLET | Freq: Four times a day (QID) | ORAL | Status: DC | PRN

## 2019-12-06 MED ORDER — DIPHENHYDRAMINE HCL 50 MG/ML IJ SOLN: 50.0000 mg | Freq: Once | INTRAMUSCULAR | Status: DC | PRN

## 2019-12-06 MED ORDER — EPINEPHRINE 0.3 MG/0.3ML IJ SOAJ: 0.3000 mg | Freq: Once | INTRAMUSCULAR | Status: DC | PRN

## 2019-12-06 MED ORDER — SODIUM CHLORIDE 0.9 % IV SOLN: INTRAVENOUS | Status: DC | PRN

## 2019-12-06 NOTE — Progress Notes (Signed)
  Diagnosis: COVID-19  Physician: Dr Joya Gaskins  Procedure: Cov[facility] Infusion Clinic Med: casirivimab\imdevimab infusion - Provided patient with casirivimab\imdevimab fact sheet for patients, parents and caregivers prior to infusion.  Complications: No immediate complications noted.  Discharge: Discharged home   [name] 12/06/2019

## 2019-12-06 NOTE — Discharge Instructions (Signed)

## 2019-12-07 LAB — SARS-COV-2, NAA 2 DAY TAT

## 2019-12-07 LAB — NOVEL CORONAVIRUS, NAA: SARS-CoV-2, NAA: DETECTED — AB

## 2019-12-08 ENCOUNTER — Encounter: Payer: Self-pay | Admitting: Family Medicine

## 2019-12-13 ENCOUNTER — Other Ambulatory Visit: Payer: Self-pay | Admitting: Family Medicine

## 2019-12-13 DIAGNOSIS — R609 Edema, unspecified: Secondary | ICD-10-CM

## 2019-12-15 ENCOUNTER — Encounter: Payer: Self-pay | Admitting: Family Medicine

## 2019-12-26 ENCOUNTER — Ambulatory Visit (INDEPENDENT_AMBULATORY_CARE_PROVIDER_SITE_OTHER): Payer: BC Managed Care – PPO | Admitting: Family Medicine

## 2019-12-26 ENCOUNTER — Other Ambulatory Visit: Payer: Self-pay

## 2019-12-26 VITALS — BP 130/90 | HR 72 | Temp 98.3°F | Ht 67.0 in | Wt 221.0 lb

## 2019-12-26 DIAGNOSIS — E119 Type 2 diabetes mellitus without complications: Secondary | ICD-10-CM | POA: Diagnosis not present

## 2019-12-26 DIAGNOSIS — F0781 Postconcussional syndrome: Secondary | ICD-10-CM

## 2019-12-26 DIAGNOSIS — S066X0D Traumatic subarachnoid hemorrhage without loss of consciousness, subsequent encounter: Secondary | ICD-10-CM

## 2019-12-26 MED ORDER — METFORMIN HCL 500 MG PO TABS: 500.0000 mg | ORAL_TABLET | Freq: Two times a day (BID) | ORAL | 11 refills | Status: DC
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
## 2020-01-23 ENCOUNTER — Encounter: Payer: Self-pay | Admitting: Family Medicine

## 2020-02-12 ENCOUNTER — Institutional Professional Consult (permissible substitution): Payer: BC Managed Care – PPO | Admitting: Neurology

## 2020-02-16 ENCOUNTER — Other Ambulatory Visit: Payer: Self-pay

## 2020-02-16 ENCOUNTER — Ambulatory Visit (INDEPENDENT_AMBULATORY_CARE_PROVIDER_SITE_OTHER): Payer: BC Managed Care – PPO | Admitting: Family Medicine

## 2020-02-16 ENCOUNTER — Encounter: Payer: Self-pay | Admitting: Family Medicine

## 2020-02-16 VITALS — BP 130/90 | HR 76 | Temp 98.4°F | Ht 67.0 in | Wt 222.0 lb

## 2020-02-16 DIAGNOSIS — F0781 Postconcussional syndrome: Secondary | ICD-10-CM

## 2020-02-16 DIAGNOSIS — E119 Type 2 diabetes mellitus without complications: Secondary | ICD-10-CM | POA: Diagnosis not present

## 2020-02-16 DIAGNOSIS — S066X0D Traumatic subarachnoid hemorrhage without loss of consciousness, subsequent encounter: Secondary | ICD-10-CM
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
## 2020-02-17 ENCOUNTER — Encounter: Payer: Self-pay | Admitting: Family Medicine

## 2020-02-17 ENCOUNTER — Other Ambulatory Visit: Payer: Self-pay

## 2020-02-17 DIAGNOSIS — E7800 Pure hypercholesterolemia, unspecified: Secondary | ICD-10-CM

## 2020-02-17 LAB — LIPID PANEL
Cholesterol: 200 mg/dL — ABNORMAL HIGH (ref <200)
HDL: 58 mg/dL (ref >=50)
LDL Cholesterol (Calc): 112 mg/dL (calc) — ABNORMAL HIGH
Non-HDL Cholesterol (Calc): 142 mg/dL (calc) — ABNORMAL HIGH (ref <130)
Total CHOL/HDL Ratio: 3.4 (calc) (ref <5.0)
Triglycerides: 180 mg/dL — ABNORMAL HIGH (ref <150)

## 2020-02-17 LAB — CBC WITH DIFFERENTIAL/PLATELET
Absolute Monocytes: 382 cells/uL (ref 200–950)
Basophils Absolute: 43 cells/uL (ref 0–200)
Basophils Relative: 0.6 %
Eosinophils Absolute: 194 cells/uL (ref 15–500)
Eosinophils Relative: 2.7 %
HCT: 37.6 % (ref 35.0–45.0)
Hemoglobin: 12.8 g/dL (ref 11.7–15.5)
Lymphs Abs: 2376 cells/uL (ref 850–3900)
MCH: 29.4 pg (ref 27.0–33.0)
MCHC: 34 g/dL (ref 32.0–36.0)
MCV: 86.4 fL (ref 80.0–100.0)
MPV: 10 fL (ref 7.5–12.5)
Monocytes Relative: 5.3 %
Neutro Abs: 4205 cells/uL (ref 1500–7800)
Neutrophils Relative %: 58.4 %
Platelets: 316 10*3/uL (ref 140–400)
RBC: 4.35 10*6/uL (ref 3.80–5.10)
RDW: 13.1 % (ref 11.0–15.0)
Total Lymphocyte: 33 %
WBC: 7.2 10*3/uL (ref 3.8–10.8)

## 2020-02-17 LAB — COMPLETE METABOLIC PANEL WITH GFR
AG Ratio: 1.5 (calc) (ref 1.0–2.5)
ALT: 36 U/L — ABNORMAL HIGH (ref 6–29)
AST: 21 U/L (ref 10–35)
Albumin: 3.9 g/dL (ref 3.6–5.1)
Alkaline phosphatase (APISO): 140 U/L (ref 37–153)
BUN: 8 mg/dL (ref 7–25)
CO2: 26 mmol/L (ref 20–32)
Calcium: 9.6 mg/dL (ref 8.6–10.4)
Chloride: 106 mmol/L (ref 98–110)
Creat: 0.58 mg/dL (ref 0.50–1.05)
GFR, Est African American: 123 mL/min/{1.73_m2} (ref >=60)
GFR, Est Non African American: 106 mL/min/{1.73_m2} (ref >=60)
Globulin: 2.6 g/dL (calc) (ref 1.9–3.7)
Glucose, Bld: 104 mg/dL — ABNORMAL HIGH (ref 65–99)
Potassium: 4.2 mmol/L (ref 3.5–5.3)
Sodium: 141 mmol/L (ref 135–146)
Total Bilirubin: 0.4 mg/dL (ref 0.2–1.2)
Total Protein: 6.5 g/dL (ref 6.1–8.1)

## 2020-02-17 LAB — MICROALBUMIN, URINE: Microalb, Ur: 0.9 mg/dL

## 2020-02-17 LAB — HEMOGLOBIN A1C
Hgb A1c MFr Bld: 6.1 % of total Hgb — ABNORMAL HIGH (ref <5.7)
Mean Plasma Glucose: 128 mg/dL
eAG (mmol/L): 7.1 mmol/L

## 2020-02-17 MED ORDER — ATORVASTATIN CALCIUM 20 MG PO TABS: 20.0000 mg | ORAL_TABLET | Freq: Every day | ORAL | 3 refills | Status: DC

## 2020-02-23 ENCOUNTER — Encounter: Payer: Self-pay | Admitting: Family Medicine

## 2020-03-12 ENCOUNTER — Other Ambulatory Visit: Payer: BC Managed Care – PPO

## 2020-03-12 DIAGNOSIS — Z20822 Contact with and (suspected) exposure to covid-19: Secondary | ICD-10-CM | POA: Diagnosis not present

## 2020-03-16 ENCOUNTER — Encounter: Payer: Self-pay | Admitting: Family Medicine

## 2020-03-16 LAB — NOVEL CORONAVIRUS, NAA: SARS-CoV-2, NAA: DETECTED — AB

## 2020-04-01 ENCOUNTER — Emergency Department (HOSPITAL_COMMUNITY): Payer: BC Managed Care – PPO

## 2020-04-01 ENCOUNTER — Emergency Department (HOSPITAL_COMMUNITY)
Admission: EM | Admit: 2020-04-01 | Discharge: 2020-04-01 | Disposition: A | Discharge status: Home / Self Care | Payer: BC Managed Care – PPO | Attending: Emergency Medicine | Admitting: Emergency Medicine

## 2020-04-01 ENCOUNTER — Encounter (HOSPITAL_COMMUNITY): Payer: Self-pay | Admitting: *Deleted

## 2020-04-01 ENCOUNTER — Other Ambulatory Visit: Payer: Self-pay

## 2020-04-01 DIAGNOSIS — Z7984 Long term (current) use of oral hypoglycemic drugs: Secondary | ICD-10-CM | POA: Insufficient documentation

## 2020-04-01 DIAGNOSIS — R079 Chest pain, unspecified: Secondary | ICD-10-CM | POA: Diagnosis not present

## 2020-04-01 DIAGNOSIS — E119 Type 2 diabetes mellitus without complications: Secondary | ICD-10-CM | POA: Diagnosis not present

## 2020-04-01 DIAGNOSIS — Z79899 Other long term (current) drug therapy: Secondary | ICD-10-CM | POA: Insufficient documentation

## 2020-04-01 DIAGNOSIS — R531 Weakness: Secondary | ICD-10-CM | POA: Diagnosis not present

## 2020-04-01 DIAGNOSIS — I10 Essential (primary) hypertension: Secondary | ICD-10-CM | POA: Diagnosis not present

## 2020-04-01 DIAGNOSIS — Z87891 Personal history of nicotine dependence: Secondary | ICD-10-CM | POA: Diagnosis not present

## 2020-04-01 DIAGNOSIS — R42 Dizziness and giddiness: Secondary | ICD-10-CM | POA: Insufficient documentation

## 2020-04-01 DIAGNOSIS — Z8616 Personal history of COVID-19: Secondary | ICD-10-CM | POA: Insufficient documentation

## 2020-04-01 HISTORY — DX: Type 2 diabetes mellitus without complications: E11.9

## 2020-04-01 LAB — BASIC METABOLIC PANEL
Anion gap: 11 (ref 5–15)
BUN: 10 mg/dL (ref 6–20)
CO2: 25 mmol/L (ref 22–32)
Calcium: 9.4 mg/dL (ref 8.9–10.3)
Chloride: 101 mmol/L (ref 98–111)
Creatinine, Ser: 0.65 mg/dL (ref 0.44–1.00)
GFR, Estimated: >60 mL/min (ref >60)
Glucose, Bld: 100 mg/dL — ABNORMAL HIGH (ref 70–99)
Potassium: 3.4 mmol/L — ABNORMAL LOW (ref 3.5–5.1)
Sodium: 137 mmol/L (ref 135–145)

## 2020-04-01 LAB — CBC
HCT: 43.2 % (ref 36.0–46.0)
Hemoglobin: 14.1 g/dL (ref 12.0–15.0)
MCH: 29.3 pg (ref 26.0–34.0)
MCHC: 32.6 g/dL (ref 30.0–36.0)
MCV: 89.6 fL (ref 80.0–100.0)
Platelets: 394 10*3/uL (ref 150–400)
RBC: 4.82 MIL/uL (ref 3.87–5.11)
RDW: 13.2 % (ref 11.5–15.5)
WBC: 9.4 10*3/uL (ref 4.0–10.5)
nRBC: 0 % (ref 0.0–0.2)

## 2020-04-01 LAB — TROPONIN I (HIGH SENSITIVITY): Troponin I (High Sensitivity): <2 ng/L (ref <18)

## 2020-04-01 NOTE — Discharge Instructions (Signed)
Your tests are normal, Come back for worsening pain See Dr. Dennard Schaumann to talk about your anxiety

## 2020-04-01 NOTE — ED Triage Notes (Addendum)
Pt reports left sided chest pain radiating to left arm, lightheadedness, and weakness since yesterday afternoon.  Denies any cough or fever.  Pt reports had covid 3 weeks ago.

## 2020-04-01 NOTE — ED Provider Notes (Signed)
[facility] EMERGENCY DEPARTMENT Provider Note   CSN: [id number] Arrival date & time: 04/01/20  1204     History Chief Complaint  Patient presents with  . Chest Pain    [name] is a 55 y.o. female.  HPI   This patient is a 55 year old female with a history of diabetes and hypertension as well as anxiety and depression.  She had COVID approximately 1 month ago, lost her grandmother about 1 week ago who she states was like a mother to her.  She was in her usual state of health until 24 hours ago when she started to feel lightheaded and dizzy, she overnight developed some chest discomfort in the middle of her chest which seems to come and go like a squeezing which squeezes and releases intermittently, it is not exertional, not positional and not related with breathing.  There is no shortness of breath or coughing.  She has no radiation of the discomfort.  She reports that she tried to check her blood pressure on her arm and after checking her blood pressure now she feels some arm discomfort as well.  She denies any swelling in her legs, has had no fevers or chills, has had no nausea vomiting or diarrhea.  No medications given prior to arrival, no history of cardiac disease though she does state the cardiac disease runs in her family.  She is not actively having any chest pain at this time  Past Medical History:  Diagnosis Date  . Anxiety   . Depression   . Diabetes mellitus without complication ([place])   . Encounter for neuropsychological testing    at [facility] 3/20-suggest possible somatization  . Headache   . Hypertension   . Migraines   . Pseudoseizure   . PTSD (post-traumatic stress disorder)   . Rosacea   . Seizures [facility])    [facility] (Dr. 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 Sleep apnea     Patient Active Problem List   Diagnosis Date Noted  . COVID-19 virus infection 12/05/2019  . New onset type 2  diabetes mellitus ([place]) 11/12/2019  . Subarachnoid hemorrhage following injury ([place]) 11/11/2019  . Class 1 obesity due to excess calories with body mass index (BMI) of 34.0 to 34.9 in adult 11/11/2019  . Encounter for monitoring postmenopausal estrogen replacement therapy 01/27/2019  . Weight loss counseling, encounter for 01/27/2019  . Vaginal odor 12/02/2018  . Hot flashes due to menopause 12/02/2018  . No energy 12/02/2018  . Decreased libido 12/02/2018  . Counseling for estrogen replacement therapy 12/02/2018  . History of seizures 12/02/2018  . Encounter for neuropsychological testing   . Lumbar stenosis with neurogenic claudication 05/23/2018  . Seizures ([place])   . 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 Jerking movements of extremities 11/11/2015  . Major depressive disorder, recurrent severe without psychotic features ([place])   . Bipolar II disorder ([place]) 08/30/2015  . Suicide attempt by drug ingestion ([place]) 08/30/2015  . Suicidal ideation 08/30/2015  . Migraines 09/29/2014  . Migraine 05/19/2013  . Left-sided weakness 05/18/2013  . Numbness and tingling of left arm and leg 05/18/2013  . CVA (cerebral infarction) 05/18/2013  . Hypertension   . Anxiety     Past Surgical History:  Procedure Laterality Date  . ABDOMINAL HYSTERECTOMY     non cancerous, partial  . APPENDECTOMY    . appendectomy    . BACK SURGERY     x2  . CHOLECYSTECTOMY    .  CHOLECYSTECTOMY    . CYST EXCISION     on ovaries  . lumbar     ruptured disc in lumbar taken out  . ROTATOR CUFF REPAIR Left   . TONSILLECTOMY    . TUBAL LIGATION       OB History    Gravida  2   Para  2   Term      Preterm      AB      Living  2     SAB      IAB      Ectopic      Multiple      Live Births              Family History  Problem Relation Age of Onset  . Diabetes Father   . Heart disease Father 74  . Hypertension Father   . Cancer Maternal Grandfather        colon cancer (70's)  . Diabetes  Paternal Grandmother   . Diabetes Paternal Grandfather   . Cancer Cousin        breast  . Breast cancer Cousin   . Breast cancer Paternal Aunt   . Breast cancer Cousin   . Breast cancer Cousin     Social History   Tobacco Use  . Smoking status: Former Smoker    Packs/day: 0.50    Years: 10.00    Pack years: 5.00    Types: Cigarettes    Quit date: 05/15/1991    Years since quitting: 28.9  . Smokeless tobacco: Never Used  Vaping Use  . Vaping Use: Never used  Substance Use Topics  . Alcohol use: No    Alcohol/week: 0.0 standard drinks    Comment: occassionally  . Drug use: No    Home Medications Prior to Admission medications   Medication Sig Start Date End Date Taking? Authorizing Provider  amLODipine (NORVASC) 5 MG tablet Take 5 mg (1 tablet) in the morning & 2.5 mg (1/2 tablet) in the evening. 05/27/19   Fay Records, MD  atorvastatin (LIPITOR) 20 MG tablet Take 1 tablet (20 mg total) by mouth daily. 02/17/20   [name], MD  Blood Glucose Monitoring Suppl (BLOOD GLUCOSE SYSTEM PAK) KIT Please dispense based on patient and insurance preference. Use as directed to monitor FSBS 1x daily. Dx: R73.09. 12/01/19   [name], MD  diazepam (VALIUM) 5 MG tablet Take 1 tablet (5 mg total) by mouth every 12 (twelve) hours as needed for anxiety. 01/13/19   [name], MD  estradiol (CLIMARA) 0.1 mg/24hr patch Place 1 patch (0.1 mg total) onto the skin once a week. Patient not taking: No sig reported 12/02/18   Derrek Monaco A, NP  furosemide (LASIX) 40 MG tablet TAKE 1 TABLET BY MOUTH DAILY. STOP HCTZ 12/17/19   [name], MD  gabapentin (NEURONTIN) 300 MG capsule Take 1 capsule (300 mg total) by mouth 3 (three) times daily as needed (headaches). 12/01/19   [name], MD  Glucose Blood (BLOOD GLUCOSE TEST STRIPS) STRP Please dispense based on patient and insurance preference. Use as directed to monitor FSBS 1x daily. Dx: R73.09. 12/01/19   [name], MD  HYDROcodone-acetaminophen (NORCO/VICODIN) 5-325 MG tablet Take 1 tablet by mouth every 4 (four) hours as needed. 11/17/19   [name], MD  Lancets MISC Please dispense based on patient and insurance preference. Use as directed to monitor FSBS 1x daily. Dx: R73.09.  12/01/19   [name], MD  metFORMIN (GLUCOPHAGE) 500 MG tablet Take 1 tablet (500 mg total) by mouth 2 (two) times daily with a meal. 12/26/19 01/25/20  [name], MD  promethazine (PHENERGAN) 25 MG tablet Take 1 tablet (25 mg total) by mouth every 6 (six) hours as needed for nausea or vomiting. 11/17/19   [name], MD  sertraline (ZOLOFT) 100 MG tablet Take 200 mg by mouth daily. Patient not taking: Reported on 02/16/2020 09/25/19   [provider]  UBRELVY 100 MG TABS Take 1 tablet by mouth daily. 11/12/19   [provider]    Allergies    Penicillins, Toradol [ketorolac tromethamine], Lamotrigine, and Zofran [ondansetron hcl]  Review of Systems   Review of Systems  All other systems reviewed and are negative.   Physical Exam Updated Vital Signs BP 106/68   Pulse 72   Temp 98.2 F (36.8 C) (Oral)   Resp 13   Ht 1.702 m ('5\' 7"' )   Wt 99.8 kg   SpO2 99%   BMI 34.46 kg/m   Physical Exam Vitals and nursing note reviewed.  Constitutional:      General: She is not in acute distress.    Appearance: She is well-developed and well-nourished.  HENT:     Head: Normocephalic and atraumatic.     Mouth/Throat:     Mouth: Oropharynx is clear and moist.     Pharynx: No oropharyngeal exudate.  Eyes:     General: No scleral icterus.       Right eye: No discharge.        Left eye: No discharge.     Extraocular Movements: EOM normal.     Conjunctiva/sclera: Conjunctivae normal.     Pupils: Pupils are equal, round, and reactive to light.  Neck:     Thyroid: No thyromegaly.     Vascular: No JVD.  Cardiovascular:     Rate and Rhythm: Normal rate and regular rhythm.      Pulses: Intact distal pulses.     Heart sounds: Normal heart sounds. No murmur heard. No friction rub. No gallop.   Pulmonary:     Effort: Pulmonary effort is normal. No respiratory distress.     Breath sounds: Normal breath sounds. No wheezing or rales.  Abdominal:     General: Bowel sounds are normal. There is no distension.     Palpations: Abdomen is soft. There is no mass.     Tenderness: There is no abdominal tenderness.  Musculoskeletal:        General: No tenderness or edema. Normal range of motion.     Cervical back: Normal range of motion and neck supple.  Lymphadenopathy:     Cervical: No cervical adenopathy.  Skin:    General: Skin is warm and dry.     Findings: No erythema or rash.  Neurological:     Mental Status: She is alert.     Coordination: Coordination normal.  Psychiatric:        Mood and Affect: Mood and affect normal.        Behavior: Behavior normal.     ED Results / Procedures / Treatments   Labs (all labs ordered are listed, but only abnormal results are displayed) Labs Reviewed  BASIC METABOLIC PANEL - Abnormal; Notable for the following components:      Result Value   Potassium 3.4 (*)    Glucose, Bld 100 (*)    All other components within normal  limits  CBC  TROPONIN I (HIGH SENSITIVITY)    EKG EKG Interpretation  Date/Time:  Thursday April 01 2020 13:39:54 EST Ventricular Rate:  91 PR Interval:  140 QRS Duration: 82 QT Interval:  362 QTC Calculation: 445 R Axis:   45 Text Interpretation: Normal sinus rhythm Normal ECG compared to 9/77/21, now has a Q wave in 3 Confirmed by Noemi Chapel [phone]) on 04/01/2020 3:15:24 PM   Radiology DG Chest 2 View  Result Date: 04/01/2020 CLINICAL DATA:  Left-sided chest pain, lightheadedness and weakness. EXAM: CHEST - 2 VIEW COMPARISON:  Chest radiograph 01/17/2019 FINDINGS: Stable cardiomediastinal contours. The lungs are clear. No pneumothorax or pleural effusion. No acute finding in the  visualized skeleton. IMPRESSION: No active cardiopulmonary disease. Electronically Signed   By: Audie Pinto M.D.   On: 04/01/2020 14:07    Procedures Procedures   Medications Ordered in ED Medications - No data to display  ED Course  I have reviewed the triage vital signs and the nursing notes.  Pertinent labs & imaging results that were available during my care of the patient were reviewed by me and considered in my medical decision making (see chart for details).    MDM Rules/Calculators/A&P                          This patient has no edema, lungs are clear, heart is regular, no murmurs, chest x-ray is unremarkable, I have personally viewed the images there is no signs of acute infiltrates pneumothorax or other significant abnormalities.  Labs have been ordered, they are pending at this time including troponin CBC and basic metabolic panel.  The patient has an EKG which is unremarkable and shows no signs of ischemia or arrhythmia.  Covid sx have resolved, no historical or clinical or EKG fidnings of pericarditis - and no hypoxia, tachycardia, pleuritic CP or swelling or other RF for PE.  Sx are non cardiac in description, no objective findings to suggest need for inpatient evaluation.  Labs normal Pt informed Stable for d/c.  Final Clinical Impression(s) / ED Diagnoses Final diagnoses:  Chest pain, unspecified type      Noemi Chapel, MD 04/01/20 1801

## 2020-04-02 ENCOUNTER — Telehealth: Payer: Self-pay

## 2020-04-02 NOTE — Telephone Encounter (Signed)
Transition Care Management Follow-up Telephone Call  Date of discharge and from where: 04/01/20 from [facility]  How have you been since you were released from the hospital? Pt stated that she is feeling good since she has been home.   Any questions or concerns? No  Items Reviewed:  Did the pt receive and understand the discharge instructions provided? Yes   Medications obtained and verified? No   Other? No   Any new allergies since your discharge? No   Dietary orders reviewed? Yes  Do you have support at home? Yes   Functional Questionnaire: (I = Independent and D = Dependent) ADLs: I  Bathing/Dressing- I  Meal Prep- I  Eating- I  Maintaining continence- I  Transferring/Ambulation- I  Managing Meds- I   Follow up appointments reviewed:   PCP Hospital f/u appt confirmed? No  Patient is calling PCP to schedule an appointment.   Are transportation arrangements needed? No   If their condition worsens, is the pt aware to call PCP or go to the Emergency Dept.? Yes  Was the patient provided with contact information for the PCP's office or ED? Yes  Was to pt encouraged to call back with questions or concerns? Yes

## 2020-04-05 ENCOUNTER — Other Ambulatory Visit: Payer: Self-pay

## 2020-04-05 ENCOUNTER — Ambulatory Visit (INDEPENDENT_AMBULATORY_CARE_PROVIDER_SITE_OTHER): Payer: BC Managed Care – PPO | Admitting: Family Medicine

## 2020-04-05 ENCOUNTER — Encounter: Payer: Self-pay | Admitting: Family Medicine

## 2020-04-05 VITALS — BP 122/78 | HR 79 | Temp 97.7°F | Ht 67.0 in | Wt 218.0 lb

## 2020-04-05 DIAGNOSIS — F418 Other specified anxiety disorders: Secondary | ICD-10-CM | POA: Diagnosis not present

## 2020-04-05 DIAGNOSIS — E119 Type 2 diabetes mellitus without complications: Secondary | ICD-10-CM | POA: Diagnosis not present

## 2020-04-05 MED ORDER — SITAGLIPTIN PHOSPHATE 100 MG PO TABS: 100.0000 mg | ORAL_TABLET | Freq: Every day | ORAL | 3 refills | Status: DC

## 2020-04-05 MED ORDER — LORAZEPAM 0.5 MG PO TABS: 0.5000 mg | ORAL_TABLET | Freq: Three times a day (TID) | ORAL | 0 refills | Status: DC | PRN

## 2020-04-05 MED ORDER — VENLAFAXINE HCL ER 75 MG PO CP24: 150.0000 mg | ORAL_CAPSULE | Freq: Every day | ORAL | 1 refills | Status: DC

## 2020-04-05 NOTE — Progress Notes (Signed)
Subjective:    Patient ID: [name], female    DOB: [birth year], 55 y.o.   MRN: [id number]  HPI  Patient reports increasing depression and anxiety.  In the last few months she has lost her grandmother as well as her uncle.  She was extremely close to both of them.  Since that time she reports feeling anhedonia and sadness and depression but she also reports increased anxiety.  She is having 2-3 panic attacks a week.  One panic attack sent her to the emergency room.  She also states that she needs to switch Metformin.  The Metformin is causing nausea and diarrhea.  Her A1c in December was excellent at 6.1 however she could not tolerate the medication.  She denies any suicidal ideation or delusions or hallucinations or mania.  She has tried Zoloft with no benefit.  She cannot tolerate Xanax.  She has taken Ativan before and found it helped with her anxiety.  She believes that she has been on Cymbalta and Lexapro. Past Medical History:  Diagnosis Date  . Anxiety   . Depression   . Diabetes mellitus without complication ([place])   . Encounter for neuropsychological testing    at [facility] 3/20-suggest possible somatization  . Headache   . Hypertension   . Migraines   . Pseudoseizure   . PTSD (post-traumatic stress disorder)   . Rosacea   . Seizures [facility])    [facility] (Dr. 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 Sleep apnea    Past Surgical History:  Procedure Laterality Date  . ABDOMINAL HYSTERECTOMY     non cancerous, partial  . APPENDECTOMY    . appendectomy    . BACK SURGERY     x2  . CHOLECYSTECTOMY    . CHOLECYSTECTOMY    . CYST EXCISION     on ovaries  . lumbar     ruptured disc in lumbar taken out  . ROTATOR CUFF REPAIR Left   . TONSILLECTOMY    . TUBAL LIGATION     Current Outpatient Medications on File Prior to Visit  Medication Sig Dispense Refill  . amLODipine (NORVASC) 5 MG tablet Take 5 mg (1 tablet)  in the morning & 2.5 mg (1/2 tablet) in the evening. 135 tablet 3  . atorvastatin (LIPITOR) 20 MG tablet Take 1 tablet (20 mg total) by mouth daily. 90 tablet 3  . Blood Glucose Monitoring Suppl (BLOOD GLUCOSE SYSTEM PAK) KIT Please dispense based on patient and insurance preference. Use as directed to monitor FSBS 1x daily. Dx: R73.09. 1 kit 1  . estradiol (CLIMARA) 0.1 mg/24hr patch Place 1 patch (0.1 mg total) onto the skin once a week. 4 patch 12  . furosemide (LASIX) 40 MG tablet TAKE 1 TABLET BY MOUTH DAILY. STOP HCTZ 90 tablet 1  . gabapentin (NEURONTIN) 300 MG capsule Take 1 capsule (300 mg total) by mouth 3 (three) times daily as needed (headaches). 90 capsule 3  . Glucose Blood (BLOOD GLUCOSE TEST STRIPS) STRP Please dispense based on patient and insurance preference. Use as directed to monitor FSBS 1x daily. Dx: R73.09. 50 strip 3  . HYDROcodone-acetaminophen (NORCO/VICODIN) 5-325 MG tablet Take 1 tablet by mouth every 4 (four) hours as needed. 10 tablet 0  . Lancets MISC Please dispense based on patient and insurance preference. Use as directed to monitor FSBS 1x daily. Dx: R73.09. 50 each 0  . promethazine (PHENERGAN) 25 MG tablet Take 1 tablet (  25 mg total) by mouth every 6 (six) hours as needed for nausea or vomiting. 30 tablet 0  . UBRELVY 100 MG TABS Take 1 tablet by mouth daily.     No current facility-administered medications on file prior to visit.   Allergies  Allergen Reactions  . Penicillins Hives    Has patient had a PCN reaction causing immediate rash, facial/tongue/throat swelling, SOB or lightheadedness with hypotension: Yes Has patient had a PCN reaction causing severe rash involving mucus membranes or skin necrosis: No Has patient had a PCN reaction that required hospitalization No Has patient had a PCN reaction occurring within the last 10 years: No If all of the above answers are "NO", then may proceed with Cephalosporin use.    . Toradol [Ketorolac  Tromethamine] Hives  . Lamotrigine Rash  . Zofran [Ondansetron Hcl] Itching   Social History   Socioeconomic History  . Marital status: Married    Spouse name: Not on file  . Number of children: Not on file  . Years of education: Not on file  . Highest education level: Not on file  Occupational History  . Not on file  Tobacco Use  . Smoking status: Former Smoker    Packs/day: 0.50    Years: 10.00    Pack years: 5.00    Types: Cigarettes    Quit date: 05/15/1991    Years since quitting: 28.9  . Smokeless tobacco: Never Used  Vaping Use  . Vaping Use: Never used  Substance and Sexual Activity  . Alcohol use: No    Alcohol/week: 0.0 standard drinks    Comment: occassionally  . Drug use: No  . Sexual activity: Yes    Birth control/protection: Surgical  Other Topics Concern  . Not on file  Social History Narrative  . Not on file   Social Determinants of Health   Financial Resource Strain: Not on file  Food Insecurity: Not on file  Transportation Needs: Not on file  Physical Activity: Not on file  Stress: Not on file  Social Connections: Not on file  Intimate Partner Violence: Not on file      Review of Systems  All other systems reviewed and are negative.      Objective:    Physical Exam Vitals reviewed.  Constitutional:      General: She is not in acute distress.    Appearance: Normal appearance. She is normal weight. She is not ill-appearing, toxic-appearing or diaphoretic.  HENT:     Head: Normocephalic.  Cardiovascular:     Rate and Rhythm: Normal rate and regular rhythm.     Heart sounds: No murmur heard.   Pulmonary:     Effort: Pulmonary effort is normal. No respiratory distress.     Breath sounds: Normal breath sounds.   Neurological:     General: No focal deficit present.     Mental Status: She is alert and oriented to person, place, and time. Mental status is at baseline.     Cranial Nerves: No cranial nerve deficit.     Motor: No  weakness.     Coordination: Coordination normal.     Gait: Gait normal.  Psychiatric:        Mood and Affect: Mood normal.        Behavior: Behavior normal.        Thought Content: Thought content normal.        Judgment: Judgment normal.         Assessment & Plan:  New onset type 2 diabetes mellitus ([place])  Depression with anxiety  Discontinue Metformin and switch to Januvia 100 mg a day hopefully this will improve her stomach upset.  Start Effexor extended release 75 mg p.o. every morning and increase to 150 mg p.o. every morning in 1 to 2 weeks if she is tolerating the medication.  Meanwhile use Ativan 0.5 mg every 8 hours as needed for panic attacks.  Reassess in 1 month or sooner if worsening

## 2020-04-20 ENCOUNTER — Other Ambulatory Visit: Payer: Self-pay | Admitting: Family Medicine

## 2020-04-20 ENCOUNTER — Ambulatory Visit (INDEPENDENT_AMBULATORY_CARE_PROVIDER_SITE_OTHER): Payer: BC Managed Care – PPO | Admitting: Neurology

## 2020-04-20 ENCOUNTER — Encounter: Payer: Self-pay | Admitting: Neurology

## 2020-04-20 VITALS — BP 118/71 | HR 85 | Ht 67.0 in | Wt 218.0 lb

## 2020-04-20 DIAGNOSIS — F445 Conversion disorder with seizures or convulsions: Secondary | ICD-10-CM | POA: Diagnosis not present

## 2020-04-20 DIAGNOSIS — Z8679 Personal history of other diseases of the circulatory system: Secondary | ICD-10-CM

## 2020-04-20 DIAGNOSIS — G44321 Chronic post-traumatic headache, intractable: Secondary | ICD-10-CM | POA: Diagnosis not present

## 2020-04-20 MED ORDER — ZONISAMIDE 100 MG PO CAPS: ORAL_CAPSULE | ORAL | 5 refills | Status: DC
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
## 2020-04-21 ENCOUNTER — Telehealth: Payer: Self-pay | Admitting: Neurology

## 2020-04-21 NOTE — Telephone Encounter (Signed)
medicare/BCBS pending faxed notes

## 2020-04-26 NOTE — Telephone Encounter (Signed)
medicare/BCBS Josem Kaufmann: [id number] (exp. 04/21/20 to 06/21/20) order sent to [facility]. They will reach out to the patient to schedule.

## 2020-04-27 ENCOUNTER — Other Ambulatory Visit: Payer: Self-pay | Admitting: Family Medicine

## 2020-04-27 ENCOUNTER — Telehealth: Payer: Self-pay | Admitting: Neurology

## 2020-04-27 NOTE — Telephone Encounter (Signed)
Pt. is asking for something to take before MRI as she believes she is claustrophobic. Please advise.

## 2020-04-27 NOTE — Telephone Encounter (Signed)
Dr. Felecia Shelling- FYI Called and spoke with pt. I reviewed her chart. She could not tolerate xanax in the past. She currently has Ativan on hand for anxiety. She will take this prior to MRI. She is aware she must have driver to and from test because it can make her drowsy.

## 2020-04-30 ENCOUNTER — Other Ambulatory Visit: Payer: Self-pay

## 2020-04-30 ENCOUNTER — Ambulatory Visit
Admission: RE | Admit: 2020-04-30 | Discharge: 2020-04-30 | Disposition: A | Discharge status: Home / Self Care | Payer: BC Managed Care – PPO | Source: Ambulatory Visit | Attending: Neurology | Admitting: Neurology

## 2020-04-30 DIAGNOSIS — G44321 Chronic post-traumatic headache, intractable: Secondary | ICD-10-CM | POA: Diagnosis not present

## 2020-04-30 MED ORDER — GADOBENATE DIMEGLUMINE 529 MG/ML IV SOLN
20.0000 mL | Freq: Once | INTRAVENOUS | Status: AC | PRN
  Administered 2020-04-30: 20 mL via INTRAVENOUS

## 2020-05-03 ENCOUNTER — Telehealth: Payer: Medicare Other | Admitting: Neurology

## 2020-05-03 DIAGNOSIS — H9201 Otalgia, right ear: Secondary | ICD-10-CM

## 2020-05-03 DIAGNOSIS — G44321 Chronic post-traumatic headache, intractable: Secondary | ICD-10-CM

## 2020-05-03 NOTE — Addendum Note (Signed)
Addended by: Wyvonnia Lora on: 05/03/2020 01:23 PM   Modules accepted: Orders

## 2020-05-03 NOTE — Telephone Encounter (Signed)
Called pt back. Relayed Dr. Garth Bigness message. She verbalized understanding. She will try Flonase. I placed referral to ENT.

## 2020-05-03 NOTE — Telephone Encounter (Signed)
Pt. left a vm requesting a call from RN.

## 2020-05-03 NOTE — Telephone Encounter (Signed)
Called patient back. Pain still present right side of head behind ear. Had episode of inner ear pain on right ear. Got dizzy/nauseaous. Has never seen ENT in the past. Still taking gabapentin. Currently taking twice daily. Sx usually only bother her in the afternoon/nighttime.  Saw MRI report that was normal in mychart. She has no signs/sx of infection. She is wanting to know what Dr. Felecia Shelling recommends at this point.

## 2020-05-03 NOTE — Telephone Encounter (Signed)
She does have some fluid in the mastoid air cells on the right.  Since she is also having right-sided ear pain she should go ahead and see ENT.  I also sent her a message to take Flonase to see if that helps reduce the fluid in the mastoids

## 2020-05-24 ENCOUNTER — Other Ambulatory Visit: Payer: Self-pay

## 2020-05-24 ENCOUNTER — Ambulatory Visit (INDEPENDENT_AMBULATORY_CARE_PROVIDER_SITE_OTHER): Payer: BC Managed Care – PPO | Admitting: Otolaryngology

## 2020-05-24 DIAGNOSIS — H6981 Other specified disorders of Eustachian tube, right ear: Secondary | ICD-10-CM

## 2020-05-24 DIAGNOSIS — G44321 Chronic post-traumatic headache, intractable: Secondary | ICD-10-CM
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
## 2020-06-08 ENCOUNTER — Encounter: Payer: Self-pay | Admitting: Family Medicine

## 2020-06-08 DIAGNOSIS — H5213 Myopia, bilateral: Secondary | ICD-10-CM | POA: Diagnosis not present

## 2020-06-08 DIAGNOSIS — E119 Type 2 diabetes mellitus without complications: Secondary | ICD-10-CM | POA: Diagnosis not present

## 2020-06-08 DIAGNOSIS — H524 Presbyopia: Secondary | ICD-10-CM | POA: Diagnosis not present

## 2020-06-08 LAB — HM DIABETES EYE EXAM

## 2020-06-11 ENCOUNTER — Other Ambulatory Visit: Payer: Self-pay | Admitting: Internal Medicine

## 2020-06-11 DIAGNOSIS — M79605 Pain in left leg: Secondary | ICD-10-CM

## 2020-06-14 ENCOUNTER — Other Ambulatory Visit: Payer: Self-pay | Admitting: Family Medicine

## 2020-06-17 ENCOUNTER — Other Ambulatory Visit: Payer: Self-pay

## 2020-06-17 ENCOUNTER — Ambulatory Visit (INDEPENDENT_AMBULATORY_CARE_PROVIDER_SITE_OTHER): Payer: BC Managed Care – PPO | Admitting: Nurse Practitioner

## 2020-06-17 VITALS — BP 122/86 | HR 97 | Temp 97.8°F | Ht 67.0 in | Wt 218.2 lb

## 2020-06-17 DIAGNOSIS — R079 Chest pain, unspecified: Secondary | ICD-10-CM | POA: Diagnosis not present

## 2020-06-17 DIAGNOSIS — R5383 Other fatigue: Secondary | ICD-10-CM | POA: Diagnosis not present

## 2020-06-17 NOTE — Patient Instructions (Addendum)
Continue Effexor Can start Tums/Maalox to help with acid reflux. Use lorazepam at night to help with anxiety.   Schedule follow up with Dr. Harrington Challenger (Cardiologits) Schedule follow up with Dr. Dennard Schaumann for next couple of weeks to touch base on chronic disease.

## 2020-06-17 NOTE — Progress Notes (Signed)
Subjective:    Patient ID: [name], female    DOB: 02-Dec-1965, 55 y.o.   MRN: [id number]  HPI: [name] is a 55 y.o. female presenting for  Chief Complaint  Patient presents with  . Fatigue    Feeling weak for 1 wk  . GI Problem    Having burning in the chest on Mon and Tues. Has bee off the effexor 2 wk, thinks this may be the reason for funny feeling.  . right ear    Has appt with ear doctor in May. Having pain and stopped up ear   GERD Reports Monday and Tuesday, felt like she was "on fire inside."  Was also having indigestion on Monday and Tuesday.  All of this improved by Wednesday.  Has also felt exhausted and tired this week. GERD control status: uncontrolled Satisfied with current treatment? Not currently on prescription treatment Heartburn frequency: on and off throughout the whole day  Medication side effects: no  Medication compliance: not currently on prescription treatment  Antacid use frequency: Not currently using Duration: hours Nature: burping Location: chest Heartburn duration: hours Alleviatiating factors:  Omeprazole and milk Aggravating factors: stress, restarting on Effexor Dysphagia: no Odynophagia:  no Hematemesis: no Blood in stool: no EGD: no  SHORTNESS OF BREATH Duration: 1 week Onset: sudden Description of breathing discomfort: short of breath, can't catch breath Severity: modeate Episode duration: with activity Frequency: with activtity Related to exertion: yes Cough: no Chest tightness: no Wheezing: no Fevers: no; night sweats  Chest pain: no Palpitations: no  Nausea: no Diaphoresis: no Deconditioning: yes; can usually walk to her mother's house without issue Status: stable Aggravating factors: activity Alleviating factors: rest Treatments attempted:  nothing  Allergies  Allergen Reactions  . Penicillins Hives    Has patient had a PCN reaction causing immediate rash, facial/tongue/throat swelling, SOB or  lightheadedness with hypotension: Yes Has patient had a PCN reaction causing severe rash involving mucus membranes or skin necrosis: No Has patient had a PCN reaction that required hospitalization No Has patient had a PCN reaction occurring within the last 10 years: No If all of the above answers are "NO", then may proceed with Cephalosporin use.    . Toradol [Ketorolac Tromethamine] Hives  . Lamotrigine Rash  . Zofran [Ondansetron Hcl] Itching    Outpatient Encounter Medications as of 06/17/2020  Medication Sig  . ACCU-CHEK GUIDE test strip USE AS DIRECTED TO MONITOR FSBS 1X DAILY. DX: R73.09.  Marland Kitchen amLODipine (NORVASC) 5 MG tablet TAKE 5 MG (1 TABLET) IN THE MORNING & 2.5 MG (1/2 TABLET) IN THE EVENING.  Marland Kitchen atorvastatin (LIPITOR) 20 MG tablet Take 1 tablet (20 mg total) by mouth daily.  . Blood Glucose Monitoring Suppl (BLOOD GLUCOSE SYSTEM PAK) KIT Please dispense based on patient and insurance preference. Use as directed to monitor FSBS 1x daily. Dx: R73.09.  . fluticasone (FLONASE) 50 MCG/ACT nasal spray Place into both nostrils daily.  . furosemide (LASIX) 40 MG tablet TAKE 1 TABLET BY MOUTH DAILY. STOP HCTZ  . HYDROcodone-acetaminophen (NORCO/VICODIN) 5-325 MG tablet Take 1 tablet by mouth every 4 (four) hours as needed.  . Lancets MISC Please dispense based on patient and insurance preference. Use as directed to monitor FSBS 1x daily. Dx: R73.09.  Marland Kitchen LORazepam (ATIVAN) 0.5 MG tablet Take 1 tablet (0.5 mg total) by mouth every 8 (eight) hours as needed for anxiety.  . promethazine (PHENERGAN) 25 MG tablet Take 1 tablet (25 mg total) by mouth every 6 (  six) hours as needed for nausea or vomiting.  . sitaGLIPtin (JANUVIA) 100 MG tablet Take 1 tablet (100 mg total) by mouth daily.  Marland Kitchen venlafaxine XR (EFFEXOR-XR) 75 MG 24 hr capsule TAKE 2 CAPSULES (150 MG TOTAL) BY MOUTH DAILY WITH BREAKFAST.  Marland Kitchen zonisamide (ZONEGRAN) 100 MG capsule Take one or two at bedtime  . gabapentin (NEURONTIN) 300 MG  capsule Take 1 capsule (300 mg total) by mouth 3 (three) times daily as needed (headaches).   No facility-administered encounter medications on file as of 06/17/2020.    Patient Active Problem List   Diagnosis Date Noted  . Intractable chronic post-traumatic headache 04/20/2020  . History of subarachnoid hemorrhage 04/20/2020  . COVID-19 virus infection 12/05/2019  . New onset type 2 diabetes mellitus ([place]) 11/12/2019  . Subarachnoid hemorrhage following injury ([place]) 11/11/2019  . Class 1 obesity due to excess calories with body mass index (BMI) of 34.0 to 34.9 in adult 11/11/2019  . Encounter for monitoring postmenopausal estrogen replacement therapy 01/27/2019  . Weight loss counseling, encounter for 01/27/2019  . Vaginal odor 12/02/2018  . Hot flashes due to menopause 12/02/2018  . No energy 12/02/2018  . Decreased libido 12/02/2018  . Counseling for estrogen replacement therapy 12/02/2018  . History of seizures 12/02/2018  . Encounter for neuropsychological testing   . Lumbar stenosis with neurogenic claudication 05/23/2018  . Seizures ([place])   . Psychogenic nonepileptic seizure 11/13/2015  . Jerking movements of extremities 11/11/2015  . Major depressive disorder, recurrent severe without psychotic features ([place])   . Bipolar II disorder ([place]) 08/30/2015  . Suicide attempt by drug ingestion ([place]) 08/30/2015  . Suicidal ideation 08/30/2015  . Migraines 09/29/2014  . Migraine 05/19/2013  . Left-sided weakness 05/18/2013  . Numbness and tingling of left arm and leg 05/18/2013  . CVA (cerebral infarction) 05/18/2013  . Hypertension   . Anxiety     Past Medical History:  Diagnosis Date  . Anxiety   . Depression   . Diabetes mellitus without complication ([place])   . Encounter for neuropsychological testing    at [facility] 3/20-suggest possible somatization  . Headache   . Hypertension   . Migraines   . Pseudoseizure   . PTSD (post-traumatic stress disorder)   . Rosacea   .  Seizures [facility])    [facility] (Dr. 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 Sleep apnea     Relevant past medical, surgical, family and social history reviewed and updated as indicated. Interim medical history since our last visit reviewed.  Review of Systems Per HPI unless specifically indicated above     Objective:    BP 122/86   Pulse 97   Temp 97.8 F (36.6 C)   Ht '5\' 7"'  (1.702 m)   Wt 218 lb 3.2 oz (99 kg)   SpO2 97%   BMI 34.17 kg/m   Wt Readings from Last 3 Encounters:  06/17/20 218 lb 3.2 oz (99 kg)  04/20/20 218 lb (98.9 kg)  04/05/20 218 lb (98.9 kg)    Physical Exam Vitals and nursing note reviewed.  Constitutional:      General: She is not in acute distress.    Appearance: Normal appearance. She is obese. She is not toxic-appearing.  HENT:     Head: Normocephalic and atraumatic.  Eyes:     General: No scleral icterus.    Extraocular Movements: Extraocular movements intact.  Neck:     Vascular: No carotid bruit.  Cardiovascular:     Rate and Rhythm: Normal rate and regular rhythm.     Heart sounds: Normal heart sounds. No murmur heard.   Pulmonary:     Effort: Pulmonary effort is normal. No respiratory distress.     Breath sounds: Normal breath sounds. No wheezing, rhonchi or rales.  Abdominal:     General: Abdomen is flat. Bowel sounds are normal. There is no distension.     Palpations: Abdomen is soft.     Tenderness: There is no right CVA tenderness or left CVA tenderness.  Musculoskeletal:     Cervical back: Normal range of motion.  Skin:    General: Skin is warm and dry.     Capillary Refill: Capillary refill takes less than 2 seconds.     Coloration: Skin is not jaundiced or pale.     Findings: No erythema.  Neurological:     Mental Status: She is alert and oriented to person, place, and time.     Motor: No weakness.     Gait: Gait normal.  Psychiatric:        Mood and  Affect: Mood normal.        Behavior: Behavior normal.        Thought Content: Thought content normal.        Judgment: Judgment normal.        Assessment & Plan:  1. Fatigue, unspecified type Acute.  Unclear etiology.  Seems most likely that this is due to stopping Effexor abruptly and then resuming earlier this week.  EKG today was normal.  Lungs are clear to auscultation and patient denies cough.  Will check TSH and CBC today to rule out thyroid abnormality acute infectious process.  Follow-up with PCP next week for chronic disease and also to reassess. - TSH - CBC with Differential  2. Chest pain, unspecified type Acute.  Patient also has a history of chest pain and was previously seen by Dr. Harrington Challenger with cardiology.  She has not seen her since 2020.  I recommend she call Dr. Alan Ripper office to schedule a follow-up appointment.  EKG today was normal.  Question if symptoms are related to anxiety and recent resumption of Effexor after she had stopped for a couple of weeks.  Continue Effexor.  Can also use lorazepam sparingly as needed to help with anxiety feeling.  Follow-up next week with PCP to reassess and for also chronic disease follow-up.  - EKG 12-Lead    Follow up plan: Return in about 2 weeks (around 07/01/2020) for follow up with PCP.   I spent more than 30 minutes with the patient today including discussing treatment plans, blood work we will be checking, EKG results, and collaboration with specialists for her chronic disease.

## 2020-06-18 LAB — CBC WITH DIFFERENTIAL/PLATELET
Absolute Monocytes: 543 cells/uL (ref 200–950)
Basophils Absolute: 58 cells/uL (ref 0–200)
Basophils Relative: 0.6 %
Eosinophils Absolute: 233 cells/uL (ref 15–500)
Eosinophils Relative: 2.4 %
HCT: 45 % (ref 35.0–45.0)
Hemoglobin: 14.6 g/dL (ref 11.7–15.5)
Lymphs Abs: 2929 cells/uL (ref 850–3900)
MCH: 28 pg (ref 27.0–33.0)
MCHC: 32.4 g/dL (ref 32.0–36.0)
MCV: 86.4 fL (ref 80.0–100.0)
MPV: 10.4 fL (ref 7.5–12.5)
Monocytes Relative: 5.6 %
Neutro Abs: 5936 cells/uL (ref 1500–7800)
Neutrophils Relative %: 61.2 %
Platelets: 396 10*3/uL (ref 140–400)
RBC: 5.21 10*6/uL — ABNORMAL HIGH (ref 3.80–5.10)
RDW: 13 % (ref 11.0–15.0)
Total Lymphocyte: 30.2 %
WBC: 9.7 10*3/uL (ref 3.8–10.8)

## 2020-06-18 LAB — TSH: TSH: 1.52 mIU/L

## 2020-07-02 ENCOUNTER — Other Ambulatory Visit: Payer: Self-pay | Admitting: Family Medicine

## 2020-07-05 ENCOUNTER — Other Ambulatory Visit: Payer: Self-pay

## 2020-07-05 ENCOUNTER — Ambulatory Visit (INDEPENDENT_AMBULATORY_CARE_PROVIDER_SITE_OTHER): Payer: BC Managed Care – PPO | Admitting: Otolaryngology

## 2020-07-05 ENCOUNTER — Encounter (INDEPENDENT_AMBULATORY_CARE_PROVIDER_SITE_OTHER): Payer: Self-pay | Admitting: Otolaryngology

## 2020-07-05 VITALS — Temp 97.2°F

## 2020-07-05 DIAGNOSIS — H9011 Conductive hearing loss, unilateral, right ear, with unrestricted hearing on the contralateral side: Secondary | ICD-10-CM | POA: Diagnosis not present

## 2020-07-05 DIAGNOSIS — H6521 Chronic serous otitis media, right ear: Secondary | ICD-10-CM | POA: Diagnosis not present

## 2020-07-05 NOTE — Progress Notes (Signed)
HPI: [name] is a 55 y.o. female who returns today for evaluation of right ear complaints.  I had seen her previously a little over a month ago with a right serous otitis.  She had an MRI scan that showed right mastoid effusion.  Her ear still feels blocked.  She has occasional discomfort on that side.  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 She has been using Flonase since previous visit.  Past Medical History:  Diagnosis Date  . Anxiety   . Depression   . Diabetes mellitus without complication ([place])   . Encounter for neuropsychological testing    at [facility] 3/20-suggest possible somatization  . Headache   . Hypertension   . Migraines   . Pseudoseizure   . PTSD (post-traumatic stress disorder)   . Rosacea   . Seizures [facility])    [facility] (Dr. 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 Sleep apnea    Past Surgical History:  Procedure Laterality Date  . ABDOMINAL HYSTERECTOMY     non cancerous, partial  . APPENDECTOMY    . appendectomy    . BACK SURGERY     x2  . CHOLECYSTECTOMY    . CHOLECYSTECTOMY    . CYST EXCISION     on ovaries  . lumbar     ruptured disc in lumbar taken out  . ROTATOR CUFF REPAIR Left   . TONSILLECTOMY    . TUBAL LIGATION     Social History   Socioeconomic History  . Marital status: Married    Spouse name: Not on file  . Number of children: 2  . Years of education: Associates   . Highest education level: Not on file  Occupational History  . Not on file  Tobacco Use  . Smoking status: Former Smoker    Packs/day: 0.50    Years: 10.00    Pack years: 5.00    Types: Cigarettes    Quit date: 05/15/1991    Years since quitting: 29.1  . Smokeless tobacco: Never Used  Vaping Use  . Vaping Use: Never used  Substance and Sexual Activity  . Alcohol use: Not Currently     Alcohol/week: 0.0 standard drinks    Comment: occassionally/socially  . Drug use: No  . Sexual activity: Yes    Birth control/protection: Surgical  Other Topics Concern  . Not on file  Social History Narrative   Caffeine use: tea sometimes, soda sometimes   Right handed   Social Determinants of Health   Financial Resource Strain: Not on file  Food Insecurity: Not on file  Transportation Needs: Not on file  Physical Activity: Not on file  Stress: Not on file  Social Connections: Not on file   Family History  Problem Relation Age of Onset  . Diabetes Father   . Heart disease Father 71  . Hypertension Father   . Cancer Maternal Grandfather        colon cancer (70's)  . Diabetes Paternal Grandmother   . Diabetes Paternal Grandfather   . Cancer Cousin        breast  . Breast cancer Cousin   . Breast cancer Paternal Aunt   . Breast cancer Cousin   . Breast cancer Cousin    Allergies  Allergen Reactions  . Penicillins Hives    Has patient had a PCN reaction causing immediate  rash, facial/tongue/throat swelling, SOB or lightheadedness with hypotension: Yes Has patient had a PCN reaction causing severe rash involving mucus membranes or skin necrosis: No Has patient had a PCN reaction that required hospitalization No Has patient had a PCN reaction occurring within the last 10 years: No If all of the above answers are "NO", then may proceed with Cephalosporin use.    . Toradol [Ketorolac Tromethamine] Hives  . Lamotrigine Rash  . Zofran [Ondansetron Hcl] Itching   Prior to Admission medications   Medication Sig Start Date End Date Taking? Authorizing Provider  ACCU-CHEK GUIDE test strip USE AS DIRECTED TO MONITOR FSBS 1X DAILY. DX: R73.09. 04/20/20   [name], MD  amLODipine (NORVASC) 5 MG tablet TAKE 5 MG (1 TABLET) IN THE MORNING & 2.5 MG (1/2 TABLET) IN THE EVENING. 06/11/20   Fay Records, MD  atorvastatin (LIPITOR) 20 MG tablet Take 1 tablet (20 mg total) by  mouth daily. 02/17/20   [name], MD  Blood Glucose Monitoring Suppl (BLOOD GLUCOSE SYSTEM PAK) KIT Please dispense based on patient and insurance preference. Use as directed to monitor FSBS 1x daily. Dx: R73.09. 12/01/19   [name], MD  fluticasone Asencion Islam) 50 MCG/ACT nasal spray Place into both nostrils daily.    [provider]  furosemide (LASIX) 40 MG tablet TAKE 1 TABLET BY MOUTH DAILY. STOP HCTZ 12/17/19   [name], MD  gabapentin (NEURONTIN) 300 MG capsule Take 1 capsule (300 mg total) by mouth 3 (three) times daily as needed (headaches). 12/01/19   [name], MD  HYDROcodone-acetaminophen (NORCO/VICODIN) 5-325 MG tablet Take 1 tablet by mouth every 4 (four) hours as needed. 11/17/19   [name], MD  JANUVIA 100 MG tablet TAKE 1 TABLET BY MOUTH EVERY DAY 07/02/20   [name], MD  Lancets MISC Please dispense based on patient and insurance preference. Use as directed to monitor FSBS 1x daily. Dx: R73.09. 12/01/19   [name], MD  LORazepam (ATIVAN) 0.5 MG tablet Take 1 tablet (0.5 mg total) by mouth every 8 (eight) hours as needed for anxiety. 04/05/20   [name], MD  promethazine (PHENERGAN) 25 MG tablet Take 1 tablet (25 mg total) by mouth every 6 (six) hours as needed for nausea or vomiting. 11/17/19   [name], MD  venlafaxine XR (EFFEXOR-XR) 75 MG 24 hr capsule TAKE 2 CAPSULES (150 MG TOTAL) BY MOUTH DAILY WITH BREAKFAST. 06/14/20   [name], MD  zonisamide (ZONEGRAN) 100 MG capsule Take one or two at bedtime 04/20/20   [name], [name], MD     Positive ROS: Otherwise negative  All other systems have been reviewed and were otherwise negative with the exception of those mentioned in the HPI and as above.  Physical Exam: Constitutional: Alert, well-appearing, no acute distress Ears: External ears without lesions or tenderness.  Ear canals are clear bilaterally.  Right TM is retracted with a serous  otitis media.  Left TM is clear.  A right M&T was performed in the office today using phenol as a topical anesthetic and myringotomy was made the anterior inferior portion of the TM a serous effusion was aspirated.  A palpable type I tube was inserted.  Patient was able to insufflate some air on her own through the tube. Nasal: External nose without lesions. Septum relatively midline.. Clear nasal passages. Oral: Lips and gums without lesions. Tongue and palate mucosa without lesions. Posterior oropharynx clear. Neck:  No palpable adenopathy or masses Respiratory: Breathing comfortably  Skin: No facial/neck lesions or rash noted.  Myringotomy  Date/Time: 07/05/2020 1:49 PM Performed by: Rozetta Nunnery, MD Authorized by: Rozetta Nunnery, MD   Consent:    Consent obtained:  Verbal   Consent given by:  Patient   Risks discussed:  Bleeding and pain   Alternatives discussed:  No treatment Pre-procedure details:    Indications: serous otitis media   Anesthesia:    Anesthesia method:  Topical application   Topical anesthetic:  Phenol Procedure Details:    Location:  Right TM   Hole made in:  Anteroinferior aspect of TM   Tube:  Paparella Type 1 Findings:    Fluid:  Serous fluid Post-procedure details:    Patient tolerance of procedure:  Tolerated well, no immediate complications Comments:     Patient with right serous otitis media.    Assessment: Chronic right serous otitis media with intermittent pain and discomfort.  Plan: A right M&T was performed in the office today with a palpable type I tube. Prescribe Z-Pak and also ofloxacin eardrops to use if she has any drainage from the ear 4 to 5 drops twice daily for 5 days. She will follow-up in 7 to 10 days to recheck.   Radene Journey, MD

## 2020-07-06 ENCOUNTER — Encounter (INDEPENDENT_AMBULATORY_CARE_PROVIDER_SITE_OTHER): Payer: Self-pay

## 2020-07-08 ENCOUNTER — Other Ambulatory Visit: Payer: Self-pay | Admitting: Family Medicine

## 2020-07-13 ENCOUNTER — Encounter (INDEPENDENT_AMBULATORY_CARE_PROVIDER_SITE_OTHER): Payer: BC Managed Care – PPO | Admitting: Otolaryngology

## 2020-07-13 ENCOUNTER — Other Ambulatory Visit: Payer: Self-pay | Admitting: Family Medicine

## 2020-07-16 ENCOUNTER — Telehealth: Payer: Self-pay | Admitting: Family Medicine

## 2020-07-16 ENCOUNTER — Other Ambulatory Visit: Payer: Self-pay

## 2020-07-16 ENCOUNTER — Ambulatory Visit (INDEPENDENT_AMBULATORY_CARE_PROVIDER_SITE_OTHER): Payer: BC Managed Care – PPO | Admitting: Otolaryngology

## 2020-07-16 VITALS — Temp 97.5°F

## 2020-07-16 DIAGNOSIS — Z4889 Encounter for other specified surgical aftercare: Secondary | ICD-10-CM

## 2020-07-16 MED ORDER — VENLAFAXINE HCL ER 75 MG PO CP24: ORAL_CAPSULE | ORAL | 1 refills | Status: DC

## 2020-07-16 NOTE — Progress Notes (Signed)
HPI: [name] is a 55 y.o. female who returns today for evaluation of right ear discomfort status post right M&T performed 10 days ago.  She is not having any drainage from the ear and use the antibiotic eardrops.  She took a Z-Pak for 5 days.  She had previously been diagnosed with mastoid effusion over a month ago with an MRI scan.  And on last visit had persistent right serous otitis media with conductive hearing loss.  She had a myringotomy tube placed in the right ear 10 days ago.  She still feels congested on the right side and some discomfort down in the ear canal as well as in the posterior right neck more in the neck and over the mastoid area..  Past Medical History:  Diagnosis Date  . Anxiety   . Depression   . Diabetes mellitus without complication ([place])   . Encounter for neuropsychological testing    at [facility] 3/20-suggest possible somatization  . Headache   . Hypertension   . Migraines   . Pseudoseizure   . PTSD (post-traumatic stress disorder)   . Rosacea   . Seizures [facility])    [facility] (Dr. 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 Sleep apnea    Past Surgical History:  Procedure Laterality Date  . ABDOMINAL HYSTERECTOMY     non cancerous, partial  . APPENDECTOMY    . appendectomy    . BACK SURGERY     x2  . CHOLECYSTECTOMY    . CHOLECYSTECTOMY    . CYST EXCISION     on ovaries  . lumbar     ruptured disc in lumbar taken out  . ROTATOR CUFF REPAIR Left   . TONSILLECTOMY    . TUBAL LIGATION     Social History   Socioeconomic History  . Marital status: Married    Spouse name: Not on file  . Number of children: 2  . Years of education: Associates   . Highest education level: Not on file  Occupational History  . Not on file  Tobacco Use  . Smoking status: Former Smoker    Packs/day: 0.50    Years: 10.00    Pack years: 5.00    Types: Cigarettes    Quit date: 05/15/1991    Years since  quitting: 29.1  . Smokeless tobacco: Never Used  Vaping Use  . Vaping Use: Never used  Substance and Sexual Activity  . Alcohol use: Not Currently    Alcohol/week: 0.0 standard drinks    Comment: occassionally/socially  . Drug use: No  . Sexual activity: Yes    Birth control/protection: Surgical  Other Topics Concern  . Not on file  Social History Narrative   Caffeine use: tea sometimes, soda sometimes   Right handed   Social Determinants of Health   Financial Resource Strain: Not on file  Food Insecurity: Not on file  Transportation Needs: Not on file  Physical Activity: Not on file  Stress: Not on file  Social Connections: Not on file   Family History  Problem Relation Age of Onset  . Diabetes Father   . Heart disease Father 14  . Hypertension Father   . Cancer Maternal Grandfather        colon cancer (70's)  . Diabetes Paternal Grandmother   . Diabetes Paternal Grandfather   . Cancer Cousin        breast  . Breast cancer Cousin   . Breast  cancer Paternal Aunt   . Breast cancer Cousin   . Breast cancer Cousin    Allergies  Allergen Reactions  . Penicillins Hives    Has patient had a PCN reaction causing immediate rash, facial/tongue/throat swelling, SOB or lightheadedness with hypotension: Yes Has patient had a PCN reaction causing severe rash involving mucus membranes or skin necrosis: No Has patient had a PCN reaction that required hospitalization No Has patient had a PCN reaction occurring within the last 10 years: No If all of the above answers are "NO", then may proceed with Cephalosporin use.    . Toradol [Ketorolac Tromethamine] Hives  . Lamotrigine Rash  . Zofran [Ondansetron Hcl] Itching   Prior to Admission medications   Medication Sig Start Date End Date Taking? Authorizing Provider  ACCU-CHEK GUIDE test strip USE AS DIRECTED TO MONITOR FSBS 1X DAILY. DX: R73.09. 04/20/20   [name], MD  amLODipine (NORVASC) 5 MG tablet TAKE 5 MG (1  TABLET) IN THE MORNING & 2.5 MG (1/2 TABLET) IN THE EVENING. 06/11/20   Fay Records, MD  atorvastatin (LIPITOR) 20 MG tablet Take 1 tablet (20 mg total) by mouth daily. 02/17/20   [name], MD  Blood Glucose Monitoring Suppl (BLOOD GLUCOSE SYSTEM PAK) KIT Please dispense based on patient and insurance preference. Use as directed to monitor FSBS 1x daily. Dx: R73.09. 12/01/19   [name], MD  fluticasone Asencion Islam) 50 MCG/ACT nasal spray Place into both nostrils daily.    [provider]  furosemide (LASIX) 40 MG tablet TAKE 1 TABLET BY MOUTH DAILY. STOP HCTZ 12/17/19   [name], MD  gabapentin (NEURONTIN) 300 MG capsule Take 1 capsule (300 mg total) by mouth 3 (three) times daily as needed (headaches). 12/01/19   [name], MD  HYDROcodone-acetaminophen (NORCO/VICODIN) 5-325 MG tablet Take 1 tablet by mouth every 4 (four) hours as needed. 11/17/19   [name], MD  JANUVIA 100 MG tablet TAKE 1 TABLET BY MOUTH EVERY DAY 07/02/20   [name], MD  Lancets MISC Please dispense based on patient and insurance preference. Use as directed to monitor FSBS 1x daily. Dx: R73.09. 12/01/19   [name], MD  LORazepam (ATIVAN) 0.5 MG tablet Take 1 tablet (0.5 mg total) by mouth every 8 (eight) hours as needed for anxiety. 04/05/20   [name], MD  promethazine (PHENERGAN) 25 MG tablet Take 1 tablet (25 mg total) by mouth every 6 (six) hours as needed for nausea or vomiting. 11/17/19   [name], MD  venlafaxine XR (EFFEXOR-XR) 75 MG 24 hr capsule TAKE 2 CAPSULES EVERY DAY WITH BREAKFAST 07/16/20   [name], MD  zonisamide (ZONEGRAN) 100 MG capsule Take one or two at bedtime 04/20/20   [name], [name], MD     Positive ROS: Otherwise negative  All other systems have been reviewed and were otherwise negative with the exception of those mentioned in the HPI and as above.  Physical Exam: Constitutional: Alert, well-appearing, no acute  distress Ears: External ears without lesions or tenderness.  Right ear canal is clear.  Right palpable type I tube is clear with no active drainage noted.  There is no swelling or pain over the mastoid region.  The discomfort she has is more posterior to the mastoid tip and in the occipital area. Nasal: External nose without lesions.. Clear nasal passages Oral: Lips and gums without lesions. Tongue and palate mucosa without lesions. Posterior oropharynx  clear. Neck: No palpable adenopathy or masses.  No adenopathy in the neck.  No swelling of the parotid area.  She has some right TMJ dysmotility but no right TMJ pain.  The ear is not tender to palpation or wiggling. Respiratory: Breathing comfortably  Skin: No facial/neck lesions or rash noted.  Procedures  Assessment: Questionable etiology of right ear discomfort as there is no acute infection noted on clinical exam. She still feels congested more so on the right side.  Plan: Prescribed Ceftin 500 mg twice daily for 10 days and will have her follow-up in 2 to 3 weeks for recheck and audiologic testing.   Radene Journey, MD

## 2020-07-16 NOTE — Telephone Encounter (Signed)
Patient called to advise provider that her refill quantity needs to be adjusted to a 90 day supply for venlafaxine XR (EFFEXOR-XR) 75 MG 24 hr capsule [[id number]]   Pharmacy confirmed as  [id number] - [place], [address] AT [facility]  [place], [place] [address]  Phone:  [phone] Fax:  [phone]  DEA #:  [id number]  Patient has one dose left for Sat, 07/17/20.  Please advise patient when Rx called in at [phone].

## 2020-07-16 NOTE — Telephone Encounter (Signed)
Prescription corrected and sent to pharmacy.   

## 2020-07-29 ENCOUNTER — Encounter: Payer: Self-pay | Admitting: Family Medicine

## 2020-07-29 ENCOUNTER — Other Ambulatory Visit: Payer: Self-pay | Admitting: Family Medicine

## 2020-07-29 MED ORDER — HYDROCOD POLST-CPM POLST ER 10-8 MG/5ML PO SUER: 5.0000 mL | Freq: Two times a day (BID) | ORAL | 0 refills | Status: DC | PRN

## 2020-08-04 ENCOUNTER — Ambulatory Visit (INDEPENDENT_AMBULATORY_CARE_PROVIDER_SITE_OTHER): Payer: BC Managed Care – PPO | Admitting: Otolaryngology

## 2020-08-20 ENCOUNTER — Other Ambulatory Visit: Payer: Self-pay | Admitting: Internal Medicine

## 2020-08-20 DIAGNOSIS — M545 Low back pain, unspecified: Secondary | ICD-10-CM

## 2020-08-24 ENCOUNTER — Ambulatory Visit (INDEPENDENT_AMBULATORY_CARE_PROVIDER_SITE_OTHER): Payer: BC Managed Care – PPO | Admitting: Neurology

## 2020-08-24 ENCOUNTER — Encounter: Payer: Self-pay | Admitting: Neurology

## 2020-08-24 VITALS — BP 105/67 | HR 82 | Ht 67.0 in | Wt 220.5 lb

## 2020-08-24 DIAGNOSIS — Z8679 Personal history of other diseases of the circulatory system: Secondary | ICD-10-CM | POA: Diagnosis not present

## 2020-08-24 DIAGNOSIS — Z87898 Personal history of other specified conditions: Secondary | ICD-10-CM | POA: Diagnosis not present

## 2020-08-24 DIAGNOSIS — G43009 Migraine without aura, not intractable, without status migrainosus: Secondary | ICD-10-CM

## 2020-08-24 DIAGNOSIS — Z87828 Personal history of other (healed) physical injury and trauma: Secondary | ICD-10-CM

## 2020-08-24 MED ORDER — BUTALBITAL-APAP-CAFFEINE 50-325-40 MG PO TABS: ORAL_TABLET | ORAL | 5 refills | Status: DC
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
## 2020-08-26 ENCOUNTER — Other Ambulatory Visit: Payer: Self-pay | Admitting: Internal Medicine

## 2020-08-26 DIAGNOSIS — M545 Low back pain, unspecified: Secondary | ICD-10-CM

## 2020-08-26 DIAGNOSIS — M79605 Pain in left leg: Secondary | ICD-10-CM

## 2020-08-31 ENCOUNTER — Ambulatory Visit (INDEPENDENT_AMBULATORY_CARE_PROVIDER_SITE_OTHER): Payer: BC Managed Care – PPO | Admitting: Otolaryngology

## 2020-08-31 ENCOUNTER — Other Ambulatory Visit: Payer: Self-pay

## 2020-08-31 DIAGNOSIS — H9071 Mixed conductive and sensorineural hearing loss, unilateral, right ear, with unrestricted hearing on the contralateral side: Secondary | ICD-10-CM
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
## 2020-09-19 ENCOUNTER — Other Ambulatory Visit: Payer: Self-pay | Admitting: Neurology

## 2020-10-11 ENCOUNTER — Other Ambulatory Visit: Payer: Self-pay | Admitting: Family Medicine

## 2020-10-11 ENCOUNTER — Encounter: Payer: Self-pay | Admitting: Family Medicine

## 2020-10-13 ENCOUNTER — Encounter: Payer: Self-pay | Admitting: Family Medicine

## 2020-10-15 MED ORDER — SITAGLIPTIN PHOSPHATE 100 MG PO TABS: 100.0000 mg | ORAL_TABLET | Freq: Every day | ORAL | 11 refills | Status: DC

## 2020-10-18 ENCOUNTER — Other Ambulatory Visit: Payer: Self-pay

## 2020-10-18 ENCOUNTER — Encounter: Payer: Self-pay | Admitting: Family Medicine

## 2020-10-18 ENCOUNTER — Ambulatory Visit (INDEPENDENT_AMBULATORY_CARE_PROVIDER_SITE_OTHER): Payer: BC Managed Care – PPO | Admitting: Family Medicine

## 2020-10-18 VITALS — BP 142/88 | HR 82 | Temp 98.7°F | Resp 14 | Ht 67.0 in | Wt 222.0 lb

## 2020-10-18 DIAGNOSIS — E118 Type 2 diabetes mellitus with unspecified complications: Secondary | ICD-10-CM

## 2020-10-18 DIAGNOSIS — R6889 Other general symptoms and signs: Secondary | ICD-10-CM | POA: Diagnosis not present

## 2020-10-18 DIAGNOSIS — R5383 Other fatigue: Secondary | ICD-10-CM | POA: Diagnosis not present

## 2020-10-18 MED ORDER — TRULICITY 0.75 MG/0.5ML ~~LOC~~ SOAJ: 0.7500 mg | SUBCUTANEOUS | 3 refills | Status: DC

## 2020-10-18 NOTE — Progress Notes (Signed)
Subjective:    Patient ID: [name], female    DOB: Mar 25, 1965, 55 y.o.   MRN: [id number]  Patient is interested for medication for weight loss.  She is currently on Januvia for type 2 diabetes.  Although the medication seems to be helping to manage her blood sugar she is certainly not seen any weight loss with the.  She also reports fatigue.  She states that she would like me to give her something to help with her energy level.  She is not exercising at present.  She states that if she walks more than a quarter of a mile "she is give out".  She denies any chest pain however.  She denies any polyuria polydipsia or blurry vision.  She denies any hypoglycemic episodes. Past Medical History:  Diagnosis Date   Anxiety    Depression    Diabetes mellitus without complication ([place])    Encounter for neuropsychological testing    at [facility] 3/20-suggest possible somatization   Headache    Hypertension    Migraines    Pseudoseizure    PTSD (post-traumatic stress disorder)    Rosacea    Seizures ([place])    [facility] (Dr. 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 DX: R73.09. 100 strip 1   amLODipine (NORVASC) 5 MG tablet TAKE 1 TABLET IN THE MORNING & 1/2 A TABLET IN THE EVENING 30 tablet 11   atorvastatin (LIPITOR) 20 MG tablet Take 1 tablet (20 mg total) by mouth daily. 90 tablet 3   Blood Glucose Monitoring  Suppl (BLOOD GLUCOSE SYSTEM PAK) KIT Please dispense based on patient and insurance preference. Use as directed to monitor FSBS 1x daily. Dx: R73.09. 1 kit 1   butalbital-acetaminophen-caffeine (FIORICET) 50-325-40 MG tablet One or two po prn headache.  No more than 20/month 20 tablet 5   chlorpheniramine-HYDROcodone (TUSSIONEX PENNKINETIC ER) 10-8 MG/5ML SUER Take 5 mLs by mouth every 12 (twelve) hours as needed for cough. 115 mL 0   fluticasone (FLONASE) 50 MCG/ACT nasal spray Place into both nostrils daily.     furosemide (LASIX) 40 MG tablet TAKE 1 TABLET BY MOUTH DAILY. STOP HCTZ 90 tablet 1   HYDROcodone-acetaminophen (NORCO/VICODIN) 5-325 MG tablet Take 1 tablet by mouth every 4 (four) hours as needed. 10 tablet 0   Lancets MISC Please dispense based on patient and insurance preference. Use as directed to monitor FSBS 1x daily. Dx: R73.09. 50 each 0   LORazepam (ATIVAN) 0.5 MG tablet Take 1 tablet (0.5 mg total) by mouth every 8 (eight) hours as needed for anxiety. 30 tablet 0   promethazine (PHENERGAN) 25 MG tablet Take 1 tablet (25 mg total) by mouth every 6 (six) hours as needed for nausea or vomiting. [place]  tablet 0   sitaGLIPtin (JANUVIA) 100 MG tablet Take 1 tablet (100 mg total) by mouth daily. 30 tablet 11   venlafaxine XR (EFFEXOR-XR) 150 MG 24 hr capsule Take 1 capsule (150 mg total) by mouth daily with breakfast. TAKE 2 CAPSULES EVERY DAY WITH BREAKFAST 90 capsule 1   zonisamide (ZONEGRAN) 100 MG capsule TAKE ONE OR TWO CAPSULES AT BEDTIME 180 capsule 1   No current facility-administered medications on file prior to visit.   Allergies  Allergen Reactions   Penicillins Hives    Has patient had a PCN reaction causing immediate rash, facial/tongue/throat swelling, SOB or lightheadedness with hypotension: Yes Has patient had a PCN reaction causing severe rash involving mucus membranes or skin necrosis: No Has patient had a PCN reaction that required hospitalization No Has patient had  a PCN reaction occurring within the last 10 years: No If all of the above answers are "NO", then may proceed with Cephalosporin use.     Toradol [Ketorolac Tromethamine] Hives   Lamotrigine Rash   Zofran [Ondansetron Hcl] Itching   Social History   Socioeconomic History   Marital status: Married    Spouse name: Not on file   Number of children: 2   Years of education: Associates    Highest education level: Not on file  Occupational History   Not on file  Tobacco Use   Smoking status: Former    Packs/day: 0.50    Years: 10.00    Pack years: 5.00    Types: Cigarettes    Quit date: 05/15/1991    Years since quitting: 29.4   Smokeless tobacco: Never  Vaping Use   Vaping Use: Never used  Substance and Sexual Activity   Alcohol use: Not Currently    Alcohol/week: 0.0 standard drinks    Comment: occassionally/socially   Drug use: No   Sexual activity: Yes    Birth control/protection: Surgical  Other Topics Concern   Not on file  Social History Narrative   Caffeine use: tea sometimes, soda sometimes   Right handed   Social Determinants of Health   Financial Resource Strain: Not on file  Food Insecurity: Not on file  Transportation Needs: Not on file  Physical Activity: Not on file  Stress: Not on file  Social Connections: Not on file  Intimate Partner Violence: Not on file      Review of Systems  All other systems reviewed and are negative.     Objective:    Physical Exam Vitals reviewed.  Constitutional:      General: She is not in acute distress.    Appearance: Normal appearance. She is normal weight. She is not ill-appearing, toxic-appearing or diaphoretic.  HENT:     Head: Normocephalic.  Cardiovascular:     Rate and Rhythm: Normal rate and regular rhythm.     Heart sounds: No murmur heard.   Pulmonary:     Effort: Pulmonary effort is normal. No respiratory distress.     Breath sounds: Normal breath sounds.   Neurological:     General: No focal  deficit present.     Mental Status: She is alert and oriented to person, place, and time. Mental status is at baseline.     Cranial Nerves: No cranial nerve deficit.     Motor: No weakness.     Coordination: Coordination normal.     Gait: Gait normal.  Psychiatric:        Mood and Affect: Mood normal.  Behavior: Behavior normal.        Thought Content: Thought content normal.        Judgment: Judgment normal.         Assessment & Plan:  Fatigue, unspecified type - Plan: Vitamin B12, CBC with Differential/Platelet, COMPLETE METABOLIC PANEL WITH GFR, TSH  Controlled type 2 diabetes mellitus with complication, without long-term current use of insulin (HCC) - Plan: Hemoglobin A1c To help achieve weight loss, of asked the patient to discontinue Januvia and replace with Trulicity 4.47 mg subcu weekly.  In 1 month I would increase to 1.5 mg subcu weekly.  Regarding her fatigue I will check a TSH, B12, CBC, CMP.  If these are normal, I would recommend trying to address her deconditioning through gradually increasing aerobic exercise ultimately to a goal of 1 hour a day 5 days a week

## 2020-10-19 ENCOUNTER — Other Ambulatory Visit: Payer: Self-pay | Admitting: *Deleted

## 2020-10-19 DIAGNOSIS — R7989 Other specified abnormal findings of blood chemistry: Secondary | ICD-10-CM

## 2020-10-19 LAB — CBC WITH DIFFERENTIAL/PLATELET
Absolute Monocytes: 362 cells/uL (ref 200–950)
Basophils Absolute: 47 cells/uL (ref 0–200)
Basophils Relative: 0.7 %
Eosinophils Absolute: 201 cells/uL (ref 15–500)
Eosinophils Relative: 3 %
HCT: 41 % (ref 35.0–45.0)
Hemoglobin: 13.6 g/dL (ref 11.7–15.5)
Lymphs Abs: 2010 cells/uL (ref 850–3900)
MCH: 29.2 pg (ref 27.0–33.0)
MCHC: 33.2 g/dL (ref 32.0–36.0)
MCV: 88 fL (ref 80.0–100.0)
MPV: 10.3 fL (ref 7.5–12.5)
Monocytes Relative: 5.4 %
Neutro Abs: 4080 cells/uL (ref 1500–7800)
Neutrophils Relative %: 60.9 %
Platelets: 304 10*3/uL (ref 140–400)
RBC: 4.66 10*6/uL (ref 3.80–5.10)
RDW: 12.7 % (ref 11.0–15.0)
Total Lymphocyte: 30 %
WBC: 6.7 10*3/uL (ref 3.8–10.8)

## 2020-10-19 LAB — COMPLETE METABOLIC PANEL WITH GFR
AG Ratio: 1.7 (calc) (ref 1.0–2.5)
ALT: 30 U/L — ABNORMAL HIGH (ref 6–29)
AST: 21 U/L (ref 10–35)
Albumin: 4.4 g/dL (ref 3.6–5.1)
Alkaline phosphatase (APISO): 182 U/L — ABNORMAL HIGH (ref 37–153)
BUN: 10 mg/dL (ref 7–25)
CO2: 25 mmol/L (ref 20–32)
Calcium: 9.5 mg/dL (ref 8.6–10.4)
Chloride: 106 mmol/L (ref 98–110)
Creat: 0.6 mg/dL (ref 0.50–1.03)
Globulin: 2.6 g/dL (calc) (ref 1.9–3.7)
Glucose, Bld: 174 mg/dL — ABNORMAL HIGH (ref 65–99)
Potassium: 4.1 mmol/L (ref 3.5–5.3)
Sodium: 142 mmol/L (ref 135–146)
Total Bilirubin: 0.4 mg/dL (ref 0.2–1.2)
Total Protein: 7 g/dL (ref 6.1–8.1)
eGFR: 108 mL/min/{1.73_m2} (ref >=60)

## 2020-10-19 LAB — HEMOGLOBIN A1C
Hgb A1c MFr Bld: 6.3 % of total Hgb — ABNORMAL HIGH (ref <5.7)
Mean Plasma Glucose: 134 mg/dL
eAG (mmol/L): 7.4 mmol/L

## 2020-10-19 LAB — TSH: TSH: 1.18 mIU/L

## 2020-10-19 LAB — VITAMIN B12: Vitamin B-12: 515 pg/mL (ref 200–1100)

## 2020-10-20 ENCOUNTER — Ambulatory Visit
Admission: RE | Admit: 2020-10-20 | Discharge: 2020-10-20 | Disposition: A | Discharge status: Home / Self Care | Payer: BC Managed Care – PPO | Source: Ambulatory Visit | Attending: Family Medicine | Admitting: Family Medicine

## 2020-10-20 DIAGNOSIS — R7989 Other specified abnormal findings of blood chemistry: Secondary | ICD-10-CM

## 2020-10-21 ENCOUNTER — Telehealth: Payer: Self-pay | Admitting: *Deleted

## 2020-10-21 NOTE — Telephone Encounter (Signed)
Received request from pharmacy for PA on Trulicity.   PA submitted.   Dx: E11.9- DM.   Your information has been submitted to [place]. To check for an updated outcome later, reopen this PA request from your dashboard.  If [employer] has not responded to your request within 24 hours, contact [place] at [phone]. If you think there may be a problem with your PA request, use our live chat feature at the bottom right.

## 2020-10-22 MED ORDER — TRULICITY 0.75 MG/0.5ML ~~LOC~~ SOAJ: 0.7500 mg | SUBCUTANEOUS | 3 refills | Status: DC

## 2020-10-22 NOTE — Telephone Encounter (Signed)
Received PA determination.   PA approved 10/21/2020- 10/22/2023.

## 2020-10-22 NOTE — Addendum Note (Signed)
Addended by: Sheral Flow on: 10/22/2020 12:12 PM   Modules accepted: Orders

## 2020-10-28 ENCOUNTER — Other Ambulatory Visit: Payer: Self-pay

## 2020-10-28 ENCOUNTER — Encounter: Payer: Self-pay | Admitting: Family Medicine

## 2020-10-28 ENCOUNTER — Ambulatory Visit (INDEPENDENT_AMBULATORY_CARE_PROVIDER_SITE_OTHER): Payer: BC Managed Care – PPO | Admitting: Family Medicine

## 2020-10-28 VITALS — BP 136/84 | HR 92 | Temp 98.1°F | Resp 16 | Ht 67.0 in | Wt 221.0 lb

## 2020-10-28 DIAGNOSIS — Z8679 Personal history of other diseases of the circulatory system: Secondary | ICD-10-CM

## 2020-10-28 DIAGNOSIS — E118 Type 2 diabetes mellitus with unspecified complications: Secondary | ICD-10-CM

## 2020-10-28 DIAGNOSIS — Z0001 Encounter for general adult medical examination with abnormal findings: Secondary | ICD-10-CM

## 2020-10-28 DIAGNOSIS — Z1231 Encounter for screening mammogram for malignant neoplasm of breast: Secondary | ICD-10-CM

## 2020-10-28 DIAGNOSIS — Z87828 Personal history of other (healed) physical injury and trauma: Secondary | ICD-10-CM

## 2020-10-28 DIAGNOSIS — Z1211 Encounter for screening for malignant neoplasm of colon: Secondary | ICD-10-CM

## 2020-10-28 DIAGNOSIS — Z0000 Encounter for general adult medical examination without abnormal findings: Secondary | ICD-10-CM

## 2020-10-28 NOTE — Progress Notes (Signed)
Subjective:    Patient ID: [name], female    DOB: 05/07/1965, 55 y.o.   MRN: [id number] 10/18/20 Patient is interested for medication for weight loss.  She is currently on Januvia for type 2 diabetes.  Although the medication seems to be helping to manage her blood sugar she is certainly not seen any weight loss with the.  She also reports fatigue.  She states that she would like me to give her something to help with her energy level.  She is not exercising at present.  She states that if she walks more than a quarter of a mile "she is give out".  She denies any chest pain however.  She denies any polyuria polydipsia or blurry vision.  She denies any hypoglycemic episodes.  At that time, my plan was:  To help achieve weight loss, of asked the patient to discontinue Januvia and replace with Trulicity 6.60 mg subcu weekly.  In 1 month I would increase to 1.5 mg subcu weekly.  Regarding her fatigue I will check a TSH, B12, CBC, CMP.  If these are normal, I would recommend trying to address her deconditioning through gradually increasing aerobic exercise ultimately to a goal of 1 hour a day 5 days a week  10/28/20 Office Visit on 10/18/2020  Component Date Value Ref Range Status   Vitamin B-12 10/18/2020 515  200 - 1,100 pg/mL Final   WBC 10/18/2020 6.7  3.8 - 10.8 Thousand/uL Final   RBC 10/18/2020 4.66  3.80 - 5.10 Million/uL Final   Hemoglobin 10/18/2020 13.6  11.7 - 15.5 g/dL Final   HCT 10/18/2020 41.0  35.0 - 45.0 % Final   MCV 10/18/2020 88.0  80.0 - 100.0 fL Final   MCH 10/18/2020 29.2  27.0 - 33.0 pg Final   MCHC 10/18/2020 33.2  32.0 - 36.0 g/dL Final   RDW 10/18/2020 12.7  11.0 - 15.0 % Final   Platelets 10/18/2020 304  140 - 400 Thousand/uL Final   MPV 10/18/2020 10.3  7.5 - 12.5 fL Final   Neutro Abs 10/18/2020 4,080  1,500 - 7,800 cells/uL Final   Lymphs Abs 10/18/2020 2,010  850 - 3,900 cells/uL Final   Absolute Monocytes 10/18/2020 362  200 - 950 cells/uL Final   Eosinophils  Absolute 10/18/2020 201  15 - 500 cells/uL Final   Basophils Absolute 10/18/2020 47  0 - 200 cells/uL Final   Neutrophils Relative % 10/18/2020 60.9  % Final   Total Lymphocyte 10/18/2020 30.0  % Final   Monocytes Relative 10/18/2020 5.4  % Final   Eosinophils Relative 10/18/2020 3.0  % Final   Basophils Relative 10/18/2020 0.7  % Final   Glucose, Bld 10/18/2020 174 (A) 65 - 99 mg/dL Final   Comment: .            Fasting reference interval . For someone without known diabetes, a glucose value >125 mg/dL indicates that they may have diabetes and this should be confirmed with a follow-up test. .    BUN 10/18/2020 10  7 - 25 mg/dL Final   Creat 10/18/2020 0.60  0.50 - 1.03 mg/dL Final   eGFR 10/18/2020 108  > OR = 60 mL/min/1.90m Final   Comment: The eGFR is based on the CKD-EPI 2021 equation. To calculate  the new eGFR from a previous Creatinine or Cystatin C result, go to https://www.kidney.org/professionals/ kdoqi/gfr%5Fcalculator    BUN/Creatinine Ratio 063/01/6010NOT APPLICABLE  6 - 22 (calc) Final   Sodium 10/18/2020 142  135 - 146 mmol/L Final   Potassium 10/18/2020 4.1  3.5 - 5.3 mmol/L Final   Chloride 10/18/2020 106  98 - 110 mmol/L Final   CO2 10/18/2020 25  20 - 32 mmol/L Final   Calcium 10/18/2020 9.5  8.6 - 10.4 mg/dL Final   Total Protein 10/18/2020 7.0  6.1 - 8.1 g/dL Final   Albumin 10/18/2020 4.4  3.6 - 5.1 g/dL Final   Globulin 10/18/2020 2.6  1.9 - 3.7 g/dL (calc) Final   AG Ratio 10/18/2020 1.7  1.0 - 2.5 (calc) Final   Total Bilirubin 10/18/2020 0.4  0.2 - 1.2 mg/dL Final   Alkaline phosphatase (APISO) 10/18/2020 182 (A) 37 - 153 U/L Final   AST 10/18/2020 21  10 - 35 U/L Final   ALT 10/18/2020 30 (A) 6 - 29 U/L Final   TSH 10/18/2020 1.18  mIU/L Final   Comment:           Reference Range .           > or = 20 Years  0.40-4.50 .                Pregnancy Ranges           First trimester    0.26-2.66           Second trimester   0.55-2.73            Third trimester    0.43-2.91    Hgb A1c MFr Bld 10/18/2020 6.3 (A) <5.7 % of total Hgb Final   Comment: For someone without known diabetes, a hemoglobin  A1c value between 5.7% and 6.4% is consistent with prediabetes and should be confirmed with a  follow-up test. . For someone with known diabetes, a value <7% indicates that their diabetes is well controlled. A1c targets should be individualized based on duration of diabetes, age, comorbid conditions, and other considerations. . This assay result is consistent with an increased risk of diabetes. . Currently, no consensus exists regarding use of hemoglobin A1c for diagnosis of diabetes for children. .    Mean Plasma Glucose 10/18/2020 134  mg/dL Final   eAG (mmol/L) 10/18/2020 7.4  mmol/L Final    Patient is here today for a complete physical exam.  Its been more than a year since she had a mammogram.  She is asking me to schedule this.  She states that she had a colonoscopy more than 10 years ago.  I can find no record of this in the chart.  Therefore she is overdue for a colonoscopy.  She states her last Pap smear was performed by her gynecologist less than 3 years ago.  Therefore this is up-to-date.  She is due for a flu shot, tetanus shot, and a COVID-vaccine.  She declines these today.  I reviewed her lab work with her from above.  Her hemoglobin A1c is excellent at 6.3 but we are switching to Trulicity to try to facilitate some weight loss.  She does have some mild elevations in her liver function test which suggest fatty liver disease.  We discussed at length a low carbohydrate low saturated fat diet rich in fruits and vegetables along with aerobic exercise to manage this.  Otherwise her lab work looks pretty good.  She does not smoke. Past Medical History:  Diagnosis Date   Anxiety    Depression    Diabetes mellitus without complication ([place])    Encounter for neuropsychological testing    at [facility]  possible  somatization   Headache    Hypertension    Migraines    Pseudoseizure    PTSD (post-traumatic stress disorder)    Rosacea    Seizures ([place])    [facility] (Dr. 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 DX: R73.09. 100 strip 1   amLODipine (NORVASC) 5 MG tablet TAKE 1 TABLET IN THE MORNING & 1/2 A TABLET IN THE EVENING 30 tablet 11   atorvastatin (LIPITOR) 20 MG tablet Take 1 tablet (20 mg total) by mouth daily. 90 tablet 3   Blood Glucose Monitoring Suppl (BLOOD GLUCOSE SYSTEM PAK) KIT Please dispense based on patient and insurance preference. Use as directed to monitor FSBS 1x daily. Dx: R73.09. 1 kit 1   butalbital-acetaminophen-caffeine (FIORICET) 50-325-40 MG tablet One or two po prn headache.  No more than 20/month 20 tablet 5   Dulaglutide (TRULICITY) 1.11 NB/5.6PO SOPN Inject 0.75 mg into the skin once a week. 2 mL 3   furosemide (LASIX) 40 MG tablet TAKE 1 TABLET BY MOUTH DAILY. STOP HCTZ 90 tablet 1   HYDROcodone-acetaminophen (NORCO/VICODIN) 5-325 MG tablet Take 1 tablet by mouth every 4 (four) hours as needed. 10 tablet 0   Lancets MISC Please dispense based on patient and insurance preference. Use as directed to monitor FSBS 1x daily. Dx: R73.09. 50 each 0   LORazepam (ATIVAN) 0.5 MG tablet Take 1 tablet (0.5 mg total) by mouth every 8 (eight) hours as needed for anxiety. 30 tablet 0    promethazine (PHENERGAN) 25 MG tablet Take 1 tablet (25 mg total) by mouth every 6 (six) hours as needed for nausea or vomiting. 30 tablet 0   sitaGLIPtin (JANUVIA) 100 MG tablet Take 1 tablet (100 mg total) by mouth daily. 30 tablet 11   venlafaxine XR (EFFEXOR-XR) 150 MG 24 hr capsule Take 1 capsule (150 mg total) by mouth daily with breakfast. TAKE 2 CAPSULES EVERY DAY WITH BREAKFAST 90 capsule 1   zonisamide (ZONEGRAN) 100 MG capsule TAKE ONE OR TWO CAPSULES AT BEDTIME 180 capsule 1   No current facility-administered medications on file prior to visit.   Allergies  Allergen Reactions   Penicillins Hives    Has patient had a PCN reaction causing immediate rash, facial/tongue/throat swelling, SOB or lightheadedness with hypotension: Yes Has patient had a PCN reaction causing severe rash involving mucus membranes or skin necrosis: No Has patient had a PCN reaction that required hospitalization No Has patient had a PCN reaction occurring within the last 10 years: No If all of the above answers are "NO", then may proceed with Cephalosporin use.     Toradol [Ketorolac Tromethamine] Hives   Lamotrigine Rash   Zofran [Ondansetron Hcl] Itching   Social History  Socioeconomic History   Marital status: Married    Spouse name: Not on file   Number of children: 2   Years of education: Associates    Highest education level: Not on file  Occupational History   Not on file  Tobacco Use   Smoking status: Former    Packs/day: 0.50    Years: 10.00    Pack years: 5.00    Types: Cigarettes    Quit date: 05/15/1991    Years since quitting: 29.4   Smokeless tobacco: Never  Vaping Use   Vaping Use: Never used  Substance and Sexual Activity   Alcohol use: Not Currently    Alcohol/week: 0.0 standard drinks    Comment: occassionally/socially   Drug use: No   Sexual activity: Yes    Birth control/protection: Surgical  Other Topics Concern   Not on file  Social History Narrative   Caffeine  use: tea sometimes, soda sometimes   Right handed   Social Determinants of Health   Financial Resource Strain: Not on file  Food Insecurity: Not on file  Transportation Needs: Not on file  Physical Activity: Not on file  Stress: Not on file  Social Connections: Not on file  Intimate Partner Violence: Not on file      Review of Systems  All other systems reviewed and are negative.     Objective:    Physical Exam Vitals reviewed.  Constitutional:      General: She is not in acute distress.    Appearance: Normal appearance. She is normal weight. She is not ill-appearing, toxic-appearing or diaphoretic.  HENT:     Head: Normocephalic.  Cardiovascular:     Rate and Rhythm: Normal rate and regular rhythm.     Heart sounds: No murmur heard.   Pulmonary:     Effort: Pulmonary effort is normal. No respiratory distress.     Breath sounds: Normal breath sounds.   Neurological:     General: No focal deficit present.     Mental Status: She is alert and oriented to person, place, and time. Mental status is at baseline.     Cranial Nerves: No cranial nerve deficit.     Motor: No weakness.     Coordination: Coordination normal.     Gait: Gait normal.  Psychiatric:        Mood and Affect: Mood normal.        Behavior: Behavior normal.        Thought Content: Thought content normal.        Judgment: Judgment normal.         Assessment & Plan:  Encounter for screening mammogram for malignant neoplasm of breast - Plan: MM Digital Screening  Colon cancer screening - Plan: Ambulatory referral to Gastroenterology  Controlled type 2 diabetes mellitus with complication, without long-term current use of insulin ([place]) - Plan: Lipid panel  History of subarachnoid hemorrhage  History of head injury  General medical exam Guarding or cancer screening, I recommended a mammogram and a colonoscopy.  I will schedule these.  Regarding her immunizations I recommended a flu shot, tetanus  shot, and a COVID booster.  She declines these.  Her Pap smear is up-to-date.  Her A1c is excellent.  I would like to switch to Trulicity and recheck an A1c in 3 months.  Diabetic foot exam was performed today and is normal.  I would like her to come by fasting at any time so that we can get a baseline fasting  lipid panel.  Patient is currently taking atorvastatin.  I would like to ensure that her LDL cholesterol is less than 100

## 2020-11-01 ENCOUNTER — Encounter: Payer: Self-pay | Admitting: Internal Medicine

## 2020-11-11 ENCOUNTER — Telehealth: Payer: Self-pay | Admitting: *Deleted

## 2020-11-11 NOTE — Telephone Encounter (Signed)
Received request from pharmacy for PA on Trulicity.   PA submitted.   Dx: E11.9- DM.   Patient is unable to tolerate Metformin.   Your information has been submitted to [place]. To check for an updated outcome later, reopen this PA request from your dashboard.  If [employer] has not responded to your request within 24 hours, contact [place] at [phone]

## 2020-11-16 ENCOUNTER — Encounter (INDEPENDENT_AMBULATORY_CARE_PROVIDER_SITE_OTHER): Payer: Self-pay

## 2020-11-17 NOTE — Telephone Encounter (Signed)
Received PA determination.   PA cancelled as prior PA has approval.   PA approved 10/21/2020- 10/22/2023.

## 2020-11-26 ENCOUNTER — Encounter: Payer: Self-pay | Admitting: Family Medicine

## 2020-11-26 ENCOUNTER — Ambulatory Visit (INDEPENDENT_AMBULATORY_CARE_PROVIDER_SITE_OTHER): Payer: BC Managed Care – PPO | Admitting: Family Medicine

## 2020-11-26 ENCOUNTER — Other Ambulatory Visit: Payer: Self-pay

## 2020-11-26 VITALS — BP 130/66 | HR 92 | Temp 98.1°F | Resp 18 | Ht 67.0 in | Wt 222.0 lb

## 2020-11-26 DIAGNOSIS — J00 Acute nasopharyngitis [common cold]: Secondary | ICD-10-CM

## 2020-11-26 MED ORDER — AZITHROMYCIN 250 MG PO TABS: ORAL_TABLET | ORAL | 0 refills | Status: DC

## 2020-11-26 NOTE — Progress Notes (Signed)
Subjective:    Patient ID: [name], female    DOB: 1965-10-07, 55 y.o.   MRN: [id number]  Symptoms started 7 days ago roughly around the time the patient took care of her family member who had RSV.  She reports head congestion and rhinorrhea.  She reports sinus pain and sinus pressure.  She reports headaches.  She reports subjective fevers although she has not recorded an actual fever.  She denies any sore throat.  She is having a cough occasionally productive of yellow and green sputum.  Today on exam, her lungs are clear to auscultation although she is obviously congested. Past Medical History:  Diagnosis Date   Anxiety    Depression    Diabetes mellitus without complication ([place])    Encounter for neuropsychological testing    at [facility] 3/20-suggest possible somatization   Headache    Hypertension    Migraines    Pseudoseizure    PTSD (post-traumatic stress disorder)    Rosacea    Seizures ([place])    [facility] (Dr. 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 DX: R73.09. 100 strip 1   amLODipine (NORVASC) 5 MG tablet TAKE 1 TABLET IN THE MORNING & 1/2 A TABLET IN THE EVENING 30 tablet 11   atorvastatin (LIPITOR) 20 MG tablet Take 1 tablet (20 mg total) by mouth daily. 90 tablet 3   Blood Glucose Monitoring Suppl (BLOOD GLUCOSE SYSTEM PAK) KIT Please dispense based on patient and insurance preference.  Use as directed to monitor FSBS 1x daily. Dx: R73.09. 1 kit 1   butalbital-acetaminophen-caffeine (FIORICET) 50-325-40 MG tablet One or two po prn headache.  No more than 20/month 20 tablet 5   Dulaglutide (TRULICITY) 3.32 RJ/1.8AC SOPN Inject 0.75 mg into the skin once a week. 2 mL 3   furosemide (LASIX) 40 MG tablet TAKE 1 TABLET BY MOUTH DAILY. STOP HCTZ 90 tablet 1   HYDROcodone-acetaminophen (NORCO/VICODIN) 5-325 MG tablet Take 1 tablet by mouth every 4 (four) hours as needed. 10 tablet 0   Lancets MISC Please dispense based on patient and insurance preference. Use as directed to monitor FSBS 1x daily. Dx: R73.09. 50 each 0   LORazepam (ATIVAN) 0.5 MG tablet Take 1 tablet (0.5 mg total) by mouth every 8 (eight) hours as needed for anxiety. 30 tablet 0   promethazine (PHENERGAN) 25 MG tablet Take 1 tablet (25 mg total) by mouth every 6 (six) hours as needed for nausea or vomiting. 30 tablet 0   sitaGLIPtin (JANUVIA) 100 MG tablet Take 1 tablet (100 mg total) by mouth daily. 30 tablet 11   venlafaxine XR (EFFEXOR-XR) 150 MG 24 hr capsule Take 1 capsule (150 mg total) by mouth daily with breakfast. TAKE 2 CAPSULES EVERY  DAY WITH BREAKFAST 90 capsule 1   zonisamide (ZONEGRAN) 100 MG capsule TAKE ONE OR TWO CAPSULES AT BEDTIME 180 capsule 1   No current facility-administered medications on file prior to visit.   Allergies  Allergen Reactions   Penicillins Hives    Has patient had a PCN reaction causing immediate rash, facial/tongue/throat swelling, SOB or lightheadedness with hypotension: Yes Has patient had a PCN reaction causing severe rash involving mucus membranes or skin necrosis: No Has patient had a PCN reaction that required hospitalization No Has patient had a PCN reaction occurring within the last 10 years: No If all of the above answers are "NO", then may proceed with Cephalosporin use.     Toradol [Ketorolac Tromethamine] Hives   Lamotrigine Rash   Zofran [Ondansetron Hcl]  Itching   Social History   Socioeconomic History   Marital status: Married    Spouse name: Not on file   Number of children: 2   Years of education: Associates    Highest education level: Not on file  Occupational History   Not on file  Tobacco Use   Smoking status: Former    Packs/day: 0.50    Years: 10.00    Pack years: 5.00    Types: Cigarettes    Quit date: 05/15/1991    Years since quitting: 29.5   Smokeless tobacco: Never  Vaping Use   Vaping Use: Never used  Substance and Sexual Activity   Alcohol use: Not Currently    Alcohol/week: 0.0 standard drinks    Comment: occassionally/socially   Drug use: No   Sexual activity: Yes    Birth control/protection: Surgical  Other Topics Concern   Not on file  Social History Narrative   Caffeine use: tea sometimes, soda sometimes   Right handed   Social Determinants of Health   Financial Resource Strain: Not on file  Food Insecurity: Not on file  Transportation Needs: Not on file  Physical Activity: Not on file  Stress: Not on file  Social Connections: Not on file  Intimate Partner Violence: Not on file      Review of Systems  All other systems reviewed and are negative.     Objective:    Physical Exam Vitals reviewed.  Constitutional:      General: She is not in acute distress.    Appearance: Normal appearance. She is normal weight. She is not ill-appearing, toxic-appearing or diaphoretic.  HENT:     Head: Normocephalic.  Cardiovascular:     Rate and Rhythm: Normal rate and regular rhythm.     Heart sounds: No murmur heard.   Pulmonary:     Effort: Pulmonary effort is normal. No respiratory distress.     Breath sounds: Normal breath sounds.   Neurological:     General: No focal deficit present.     Mental Status: She is alert and oriented to person, place, and time. Mental status is at baseline.     Cranial Nerves: No cranial nerve deficit.     Motor: No weakness.     Coordination: Coordination  normal.     Gait: Gait normal.  Psychiatric:        Mood and Affect: Mood normal.        Behavior: Behavior normal.        Thought Content: Thought content normal.        Judgment: Judgment normal.   Significant head congestion and rhinorrhea.  Swollen turbinates with increased nasal mucosa and rhinorrhea.  Tenderness to  palpation over her maxillary and frontal sinuses.      Assessment & Plan:  Acute nasopharyngitis - Plan: SARS-COV-2 RNA,(COVID-19) QUAL NAAT I believe the patient likely has an upper respiratory infection secondary to RSV.  I recommended Coricidin and Mucinex and tincture of time.  If the headaches and sinus pain worsen she can take a Z-Pak for secondary bacterial sinusitis but I explained to her that 90% of all upper respiratory infections and sinus infections improve in 10 days regardless of antibiotics.

## 2020-11-27 LAB — SARS-COV-2 RNA,(COVID-19) QUALITATIVE NAAT: SARS CoV2 RNA: NOT DETECTED

## 2020-12-03 ENCOUNTER — Encounter: Payer: Self-pay | Admitting: Family Medicine

## 2020-12-06 MED ORDER — PROMETHAZINE HCL 25 MG PO TABS: 25.0000 mg | ORAL_TABLET | Freq: Four times a day (QID) | ORAL | 0 refills | Status: DC | PRN

## 2020-12-13 MED ORDER — TRULICITY 1.5 MG/0.5ML ~~LOC~~ SOAJ: 1.5000 mg | SUBCUTANEOUS | 6 refills | Status: DC

## 2020-12-31 ENCOUNTER — Other Ambulatory Visit: Payer: Self-pay

## 2020-12-31 ENCOUNTER — Ambulatory Visit (INDEPENDENT_AMBULATORY_CARE_PROVIDER_SITE_OTHER): Payer: BC Managed Care – PPO

## 2020-12-31 VITALS — Ht 67.0 in | Wt 222.0 lb

## 2020-12-31 DIAGNOSIS — Z0000 Encounter for general adult medical examination without abnormal findings: Secondary | ICD-10-CM

## 2020-12-31 NOTE — Patient Instructions (Signed)
[name] , Thank you for taking time to come for your Medicare Wellness Visit. I appreciate your ongoing commitment to your health goals. Please review the following plan we discussed and let me know if I can assist you in the future.   Screening recommendations/referrals: Colonoscopy: Ordered 10/28/20 Mammogram: Scheduled for 01/03/2021. Bone Density: Not required until age 53.  Recommended yearly ophthalmology/optometry visit for glaucoma screening and checkup Recommended yearly dental visit for hygiene and checkup  Vaccinations: Influenza vaccine: Due Repeat annually  Pneumococcal vaccine: Not due until age 62 Tdap vaccine: Due Repeat in 10 years  Shingles vaccine: Shingrix discussed. Please contact your pharmacy for coverage information.    Covid-19: Done 06/13/2019 and 07/16/2019  Advanced directives: Advance directive discussed with you today. Even though you declined this today, please call our office should you change your mind, and we can give you the proper paperwork for you to fill out.   Conditions/risks identified: Aim for 30 minutes of exercise or brisk walking each day, drink 6-8 glasses of water and eat lots of fruits and vegetables.   Next appointment: Follow up in one year for your annual wellness visit. 2023.  Preventive Care 40-64 Years, Female Preventive care refers to lifestyle choices and visits with your health care provider that can promote health and wellness. What does preventive care include? A yearly physical exam. This is also called an annual well check. Dental exams once or twice a year. Routine eye exams. Ask your health care provider how often you should have your eyes checked. Personal lifestyle choices, including: Daily care of your teeth and gums. Regular physical activity. Eating a healthy diet. Avoiding tobacco and drug use. Limiting alcohol use. Practicing safe sex. Taking low-dose aspirin daily starting at age 84. Taking vitamin and  mineral supplements as recommended by your health care provider. What happens during an annual well check? The services and screenings done by your health care provider during your annual well check will depend on your age, overall health, lifestyle risk factors, and family history of disease. Counseling  Your health care provider may ask you questions about your: Alcohol use. Tobacco use. Drug use. Emotional well-being. Home and relationship well-being. Sexual activity. Eating habits. Work and work Statistician. Method of birth control. Menstrual cycle. Pregnancy history. Screening  You may have the following tests or measurements: Height, weight, and BMI. Blood pressure. Lipid and cholesterol levels. These may be checked every 5 years, or more frequently if you are over 49 years old. Skin check. Lung cancer screening. You may have this screening every year starting at age 92 if you have a 30-pack-year history of smoking and currently smoke or have quit within the past 15 years. Fecal occult blood test (FOBT) of the stool. You may have this test every year starting at age 18. Flexible sigmoidoscopy or colonoscopy. You may have a sigmoidoscopy every 5 years or a colonoscopy every 10 years starting at age 37. Hepatitis C blood test. Hepatitis B blood test. Sexually transmitted disease (STD) testing. Diabetes screening. This is done by checking your blood sugar (glucose) after you have not eaten for a while (fasting). You may have this done every 1-3 years. Mammogram. This may be done every 1-2 years. Talk to your health care provider about when you should start having regular mammograms. This may depend on whether you have a family history of breast cancer. BRCA-related cancer screening. This may be done if you have a family history of breast, ovarian, tubal, or peritoneal  cancers. Pelvic exam and Pap test. This may be done every 3 years starting at age 69. Starting at age 35, this may  be done every 5 years if you have a Pap test in combination with an HPV test. Bone density scan. This is done to screen for osteoporosis. You may have this scan if you are at high risk for osteoporosis. Discuss your test results, treatment options, and if necessary, the need for more tests with your health care provider. Vaccines  Your health care provider may recommend certain vaccines, such as: Influenza vaccine. This is recommended every year. Tetanus, diphtheria, and acellular pertussis (Tdap, Td) vaccine. You may need a Td booster every 10 years. Zoster vaccine. You may need this after age 73. Pneumococcal 13-valent conjugate (PCV13) vaccine. You may need this if you have certain conditions and were not previously vaccinated. Pneumococcal polysaccharide (PPSV23) vaccine. You may need one or two doses if you smoke cigarettes or if you have certain conditions. Talk to your health care provider about which screenings and vaccines you need and how often you need them. This information is not intended to replace advice given to you by your health care provider. Make sure you discuss any questions you have with your health care provider. Document Released: 03/19/2015 Document Revised: 11/10/2015 Document Reviewed: 12/22/2014 Elsevier Interactive Patient Education  2017 [place] Prevention in the Home Falls can cause injuries. They can happen to people of all ages. There are many things you can do to make your home safe and to help prevent falls. What can I do on the outside of my home? Regularly fix the edges of walkways and driveways and fix any cracks. Remove anything that might make you trip as you walk through a door, such as a raised step or threshold. Trim any bushes or trees on the path to your home. Use bright outdoor lighting. Clear any walking paths of anything that might make someone trip, such as rocks or tools. Regularly check to see if handrails are loose or  broken. Make sure that both sides of any steps have handrails. Any raised decks and porches should have guardrails on the edges. Have any leaves, snow, or ice cleared regularly. Use sand or salt on walking paths during winter. Clean up any spills in your garage right away. This includes oil or grease spills. What can I do in the bathroom? Use night lights. Install grab bars by the toilet and in the tub and shower. Do not use towel bars as grab bars. Use non-skid mats or decals in the tub or shower. If you need to sit down in the shower, use a plastic, non-slip stool. Keep the floor dry. Clean up any water that spills on the floor as soon as it happens. Remove soap buildup in the tub or shower regularly. Attach bath mats securely with double-sided non-slip rug tape. Do not have throw rugs and other things on the floor that can make you trip. What can I do in the bedroom? Use night lights. Make sure that you have a light by your bed that is easy to reach. Do not use any sheets or blankets that are too big for your bed. They should not hang down onto the floor. Have a firm chair that has side arms. You can use this for support while you get dressed. Do not have throw rugs and other things on the floor that can make you trip. What can I do in the  kitchen? Clean up any spills right away. Avoid walking on wet floors. Keep items that you use a lot in easy-to-reach places. If you need to reach something above you, use a strong step stool that has a grab bar. Keep electrical cords out of the way. Do not use floor polish or wax that makes floors slippery. If you must use wax, use non-skid floor wax. Do not have throw rugs and other things on the floor that can make you trip. What can I do with my stairs? Do not leave any items on the stairs. Make sure that there are handrails on both sides of the stairs and use them. Fix handrails that are broken or loose. Make sure that handrails are as long as  the stairways. Check any carpeting to make sure that it is firmly attached to the stairs. Fix any carpet that is loose or worn. Avoid having throw rugs at the top or bottom of the stairs. If you do have throw rugs, attach them to the floor with carpet tape. Make sure that you have a light switch at the top of the stairs and the bottom of the stairs. If you do not have them, ask someone to add them for you. What else can I do to help prevent falls? Wear shoes that: Do not have high heels. Have rubber bottoms. Are comfortable and fit you well. Are closed at the toe. Do not wear sandals. If you use a stepladder: Make sure that it is fully opened. Do not climb a closed stepladder. Make sure that both sides of the stepladder are locked into place. Ask someone to hold it for you, if possible. Clearly mark and make sure that you can see: Any grab bars or handrails. First and last steps. Where the edge of each step is. Use tools that help you move around (mobility aids) if they are needed. These include: Canes. Walkers. Scooters. Crutches. Turn on the lights when you go into a dark area. Replace any light bulbs as soon as they burn out. Set up your furniture so you have a clear path. Avoid moving your furniture around. If any of your floors are uneven, fix them. If there are any pets around you, be aware of where they are. Review your medicines with your doctor. Some medicines can make you feel dizzy. This can increase your chance of falling. Ask your doctor what other things that you can do to help prevent falls. This information is not intended to replace advice given to you by your health care provider. Make sure you discuss any questions you have with your health care provider. Document Released: 12/17/2008 Document Revised: 07/29/2015 Document Reviewed: 03/27/2014 Elsevier Interactive Patient Education  2017 [employer].
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
## 2021-01-03 ENCOUNTER — Other Ambulatory Visit: Payer: Self-pay

## 2021-01-03 ENCOUNTER — Ambulatory Visit
Admission: RE | Admit: 2021-01-03 | Discharge: 2021-01-03 | Disposition: A | Discharge status: Home / Self Care | Payer: BC Managed Care – PPO | Source: Ambulatory Visit | Attending: Family Medicine | Admitting: Family Medicine

## 2021-01-03 DIAGNOSIS — Z1231 Encounter for screening mammogram for malignant neoplasm of breast: Secondary | ICD-10-CM | POA: Diagnosis not present

## 2021-01-10 ENCOUNTER — Ambulatory Visit (INDEPENDENT_AMBULATORY_CARE_PROVIDER_SITE_OTHER): Payer: BC Managed Care – PPO | Admitting: Family Medicine

## 2021-01-10 ENCOUNTER — Encounter: Payer: Self-pay | Admitting: Family Medicine

## 2021-01-10 ENCOUNTER — Ambulatory Visit: Payer: Self-pay | Admitting: Family Medicine

## 2021-01-10 ENCOUNTER — Other Ambulatory Visit: Payer: Self-pay

## 2021-01-10 VITALS — BP 130/68 | HR 88 | Temp 98.2°F | Resp 14 | Ht 67.0 in | Wt 219.0 lb

## 2021-01-10 DIAGNOSIS — R296 Repeated falls: Secondary | ICD-10-CM

## 2021-01-10 DIAGNOSIS — W19XXXA Unspecified fall, initial encounter: Secondary | ICD-10-CM | POA: Diagnosis not present

## 2021-01-10 MED ORDER — TRULICITY 3 MG/0.5ML ~~LOC~~ SOAJ: 3.0000 mg | SUBCUTANEOUS | 2 refills | Status: DC

## 2021-01-10 NOTE — Progress Notes (Signed)
Subjective:    Patient ID: [name], female    DOB: 08/31/1965, 55 y.o.   MRN: [id number] Patient has fallen twice in the last few weeks.  Once she was walking down the sidewalk.  She did not lose consciousness.  She does not remember tripping however she suddenly lost her balance and fell landing on her knees.  She did not strike her face or her head or suffer loss of consciousness.  There was no witnessed seizure activity.  She did not bite her tongue or suffer bowel or bladder dysfunction.  Her grandson was with her the entire time and he did not witness her acting "differently" or talking incoherently.  There was no postictal phase.  The second time she was walking into a store and she lost her balance and fell.  Again she landed on her knees and her hands.  She did not suffer any head trauma.  She did not lose consciousness.  This was witnessed by 2 police officers who came to her assistance.  They denied any seizure activity or bowel or bladder incontinence or postictal phase.  In both instances, the patient was alert and awake.  She was able to control her fall and braced herself to prevent from suffering any major injury or head trauma.  There was no confusion.  Patient maintained full control of her faculties.  She denied any chest pain shortness of breath or dyspnea on exertion.  Today Romberg testing is normal.  Finger-nose testing is normal.  Heel-to-toe walk is normal.  The remainder of her neurologic exam is completely normal. Past Medical History:  Diagnosis Date   Anxiety    Depression    Diabetes mellitus without complication ([place])    Encounter for neuropsychological testing    at [facility] 3/20-suggest possible somatization   Headache    Hypertension    Migraines    Pseudoseizure    PTSD (post-traumatic stress disorder)    Rosacea    Seizures ([place])    [facility] (Dr. 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 DX: R73.09. 100 strip 1   amLODipine (NORVASC) 5 MG tablet TAKE 1 TABLET IN THE MORNING & 1/2 A TABLET IN THE EVENING 30 tablet 11   atorvastatin (LIPITOR) 20 MG tablet Take 1 tablet (20 mg total) by mouth daily. 90 tablet 3   azithromycin (ZITHROMAX) 250 MG tablet 2 tabs poqday1, 1 tab poqday 2-5 6 tablet 0   Blood Glucose Monitoring Suppl (BLOOD GLUCOSE SYSTEM PAK) KIT Please dispense based on patient and insurance preference. Use as directed to monitor FSBS 1x daily. Dx: R73.09. 1 kit 1   butalbital-acetaminophen-caffeine (FIORICET) 50-325-40 MG tablet One or two po prn headache.  No more than 20/month 20  tablet 5   furosemide (LASIX) 40 MG tablet TAKE 1 TABLET BY MOUTH DAILY. STOP HCTZ 90 tablet 1   HYDROcodone-acetaminophen (NORCO/VICODIN) 5-325 MG tablet Take 1 tablet by mouth every 4 (four) hours as needed. 10 tablet 0   Lancets MISC Please dispense based on patient and insurance preference. Use as directed to monitor FSBS 1x daily. Dx: R73.09. 50 each 0   LORazepam (ATIVAN) 0.5 MG tablet Take 1 tablet (0.5 mg total) by mouth every 8 (eight) hours as needed for anxiety. 30 tablet 0   promethazine (PHENERGAN) 25 MG tablet Take 1 tablet (25 mg total) by mouth every 6 (six) hours as needed for nausea or vomiting. 30 tablet 0   sitaGLIPtin (JANUVIA) 100 MG tablet Take 1 tablet (100 mg total) by mouth daily. 30 tablet 11   venlafaxine XR (EFFEXOR-XR) 150 MG 24 hr capsule Take 1  capsule (150 mg total) by mouth daily with breakfast. TAKE 2 CAPSULES EVERY DAY WITH BREAKFAST 90 capsule 1   zonisamide (ZONEGRAN) 100 MG capsule TAKE ONE OR TWO CAPSULES AT BEDTIME 180 capsule 1   No current facility-administered medications on file prior to visit.   Allergies  Allergen Reactions   Penicillins Hives    Has patient had a PCN reaction causing immediate rash, facial/tongue/throat swelling, SOB or lightheadedness with hypotension: Yes Has patient had a PCN reaction causing severe rash involving mucus membranes or skin necrosis: No Has patient had a PCN reaction that required hospitalization No Has patient had a PCN reaction occurring within the last 10 years: No If all of the above answers are "NO", then may proceed with Cephalosporin use.     Toradol [Ketorolac Tromethamine] Hives   Lamotrigine Rash   Zofran [Ondansetron Hcl] Itching   Social History   Socioeconomic History   Marital status: Married    Spouse name: Not on file   Number of children: 2   Years of education: Associates    Highest education level: Not on file  Occupational History   Not on file  Tobacco Use   Smoking status: Former    Packs/day: 0.50    Years: 10.00    Pack years: 5.00    Types: Cigarettes    Quit date: 05/15/1991    Years since quitting: 29.6   Smokeless tobacco: Never  Vaping Use   Vaping Use: Never used  Substance and Sexual Activity   Alcohol use: Not Currently    Alcohol/week: 0.0 standard drinks    Comment: occassionally/socially   Drug use: No   Sexual activity: Yes    Birth control/protection: Surgical  Other Topics Concern   Not on file  Social History Narrative   Caffeine use: tea sometimes, soda sometimes   Right handed   Married x 5 years 01/27/21   1 son and 1 daughter. 5 grandchildren.    Social Determinants of Health   Financial Resource Strain: Low Risk    Difficulty of Paying Living Expenses: Not hard at all  Food Insecurity: No Food Insecurity    Worried About Charity fundraiser in the Last Year: Never true   [place] in the Last Year: Never true  Transportation Needs: No Transportation Needs   Lack of Transportation (Medical): No   Lack of Transportation (Non-Medical): No  Physical Activity: Insufficiently Active   Days of Exercise per Week: 3 days   Minutes of Exercise per Session: 30 min  Stress: No Stress Concern Present   Feeling of Stress :  Only a little  Social Connections: Engineer, building services of Communication with Friends and Family: More than three times a week   Frequency of Social Gatherings with Friends and Family: More than three times a week   Attends Religious Services: 1 to 4 times per year   Active Member of [employer] or Organizations: Yes   Attends Archivist Meetings: 1 to 4 times per year   Marital Status: Married  Human resources officer Violence: Not At Risk   Fear of Current or Ex-Partner: No   Emotionally Abused: No   Physically Abused: No   Sexually Abused: No      Review of Systems  All other systems reviewed and are negative.     Objective:    Physical Exam Vitals reviewed.  Constitutional:      General: She is not in acute distress.    Appearance: Normal appearance. She is normal weight. She is not ill-appearing, toxic-appearing or diaphoretic.  HENT:     Head: Normocephalic.  Cardiovascular:     Rate and Rhythm: Normal rate and regular rhythm.     Heart sounds: No murmur heard.   Pulmonary:     Effort: Pulmonary effort is normal. No respiratory distress.     Breath sounds: Normal breath sounds.   Neurological:     General: No focal deficit present.     Mental Status: She is alert and oriented to person, place, and time. Mental status is at baseline.     Cranial Nerves: No cranial nerve deficit.     Motor: No weakness.     Coordination: Coordination normal.     Gait: Gait normal.  Psychiatric:        Mood and Affect: Mood normal.        Behavior: Behavior  normal.        Thought Content: Thought content normal.        Judgment: Judgment normal.         Assessment & Plan:  Fall, initial encounter Based on her history and physical exam I do not feel that these falls are evidence of any drop attacks or seizures or syncope.  At the present time I will treat them as simple falls.  If persistent, we may want to do some further testing however today her neurologic exam is completely normal.

## 2021-01-19 ENCOUNTER — Encounter: Payer: Self-pay | Admitting: Family Medicine

## 2021-01-19 ENCOUNTER — Other Ambulatory Visit: Payer: Self-pay | Admitting: Family Medicine

## 2021-01-31 ENCOUNTER — Other Ambulatory Visit: Payer: Self-pay | Admitting: Family Medicine

## 2021-02-01 ENCOUNTER — Other Ambulatory Visit: Payer: Self-pay

## 2021-02-14 ENCOUNTER — Other Ambulatory Visit: Payer: Self-pay | Admitting: Family Medicine

## 2021-02-14 MED ORDER — ONDANSETRON HCL 4 MG PO TABS: 4.0000 mg | ORAL_TABLET | Freq: Three times a day (TID) | ORAL | 0 refills | Status: DC | PRN

## 2021-02-14 NOTE — Telephone Encounter (Signed)
OK to send in [place]? Thanks!

## 2021-02-23 ENCOUNTER — Encounter: Payer: Self-pay | Admitting: Family Medicine

## 2021-02-23 ENCOUNTER — Ambulatory Visit (INDEPENDENT_AMBULATORY_CARE_PROVIDER_SITE_OTHER): Payer: BC Managed Care – PPO | Admitting: Family Medicine

## 2021-02-23 ENCOUNTER — Ambulatory Visit: Payer: Medicare Other | Admitting: Family Medicine

## 2021-02-23 VITALS — BP 143/87 | HR 71 | Ht 67.0 in | Wt 218.0 lb

## 2021-02-23 DIAGNOSIS — G43009 Migraine without aura, not intractable, without status migrainosus: Secondary | ICD-10-CM | POA: Diagnosis not present

## 2021-02-23 DIAGNOSIS — Z8679 Personal history of other diseases of the circulatory system: Secondary | ICD-10-CM | POA: Diagnosis not present

## 2021-02-23 DIAGNOSIS — F445 Conversion disorder with seizures or convulsions: Secondary | ICD-10-CM | POA: Diagnosis not present

## 2021-02-23 MED ORDER — ZONISAMIDE 100 MG PO CAPS: ORAL_CAPSULE | ORAL | 3 refills | Status: DC

## 2021-02-23 NOTE — Patient Instructions (Addendum)
Below is our plan:  We will continue zonisamide. Continue Fioricet per PCP direction. Please do not exceed 10-20 tablets a month. Consider discussion with PCP regarding brain fog. This could be from a number of things. May consider formal neurocognitive testing with neuropsychology. I have included memory compensation strategies below.   Please make sure you are staying well hydrated. I recommend 50-60 ounces daily. Well balanced diet and regular exercise encouraged. Consistent sleep schedule with 6-8 hours recommended.   Please continue follow up with care team as directed.   Follow up with [place] in 1 year  You may receive a survey regarding today's visit. I encourage you to leave honest feed back as I do use this information to improve patient care. Thank you for seeing me today!   Management of Memory Problems   There are some general things you can do to help manage your memory problems.  Your memory may not in fact recover, but by using techniques and strategies you will be able to manage your memory difficulties better.   1)  Establish a routine. Try to establish and then stick to a regular routine.  By doing this, you will get used to what to expect and you will reduce the need to rely on your memory.  Also, try to do things at the same time of day, such as taking your medication or checking your calendar first thing in the morning. Think about think that you can do as a part of a regular routine and make a list.  Then enter them into a daily planner to remind you.  This will help you establish a routine.   2)  Organize your environment. Organize your environment so that it is uncluttered.  Decrease visual stimulation.  Place everyday items such as keys or cell phone in the same place every day (ie.  Basket next to front door) Use post it notes with a brief message to yourself (ie. Turn off light, lock the door) Use labels to indicate where things go (ie. Which cupboards are for food,  dishes, etc.) Keep a notepad and pen by the telephone to take messages   3)  Memory Aids A diary or journal/notebook/daily planner Making a list (shopping list, chore list, to do list that needs to be done) Using an alarm as a reminder (kitchen timer or cell phone alarm) Using cell phone to store information (Notes, Calendar, Reminders) Calendar/White board placed in a prominent position Post-it notes   In order for memory aids to be useful, you need to have good habits.  It's no good remembering to make a note in your journal if you don't remember to look in it.  Try setting aside a certain time of day to look in journal.   4)  Improving mood and managing fatigue. There may be other factors that contribute to memory difficulties.  Factors, such as anxiety, depression and tiredness can affect memory. Regular gentle exercise can help improve your mood and give you more energy. Simple relaxation techniques may help relieve symptoms of anxiety Try to get back to completing activities or hobbies you enjoyed doing in the past. Learn to pace yourself through activities to decrease fatigue. Find out about some local support groups where you can share experiences with others. Try and achieve 7-8 hours of sleep at night.
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
## 2021-03-02 ENCOUNTER — Encounter: Payer: Self-pay | Admitting: Gastroenterology

## 2021-03-08 ENCOUNTER — Encounter: Payer: Self-pay | Admitting: Family Medicine

## 2021-03-08 ENCOUNTER — Ambulatory Visit (INDEPENDENT_AMBULATORY_CARE_PROVIDER_SITE_OTHER): Payer: BC Managed Care – PPO | Admitting: Family Medicine

## 2021-03-08 ENCOUNTER — Other Ambulatory Visit: Payer: Self-pay

## 2021-03-08 VITALS — BP 138/86 | HR 83 | Ht 67.0 in | Wt 216.0 lb

## 2021-03-08 DIAGNOSIS — R413 Other amnesia: Secondary | ICD-10-CM

## 2021-03-08 DIAGNOSIS — E7800 Pure hypercholesterolemia, unspecified: Secondary | ICD-10-CM

## 2021-03-08 DIAGNOSIS — E118 Type 2 diabetes mellitus with unspecified complications: Secondary | ICD-10-CM

## 2021-03-08 MED ORDER — SITAGLIPTIN PHOSPHATE 100 MG PO TABS: 100.0000 mg | ORAL_TABLET | Freq: Every day | ORAL | 3 refills | Status: DC

## 2021-03-08 MED ORDER — ATORVASTATIN CALCIUM 20 MG PO TABS: 20.0000 mg | ORAL_TABLET | Freq: Every day | ORAL | 3 refills | Status: DC
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
## 2021-03-09 LAB — BASIC METABOLIC PANEL WITH GFR
BUN: 9 mg/dL (ref 7–25)
CO2: 26 mmol/L (ref 20–32)
Calcium: 9.3 mg/dL (ref 8.6–10.4)
Chloride: 104 mmol/L (ref 98–110)
Creat: 0.62 mg/dL (ref 0.50–1.03)
Glucose, Bld: 131 mg/dL — ABNORMAL HIGH (ref 65–99)
Potassium: 3.7 mmol/L (ref 3.5–5.3)
Sodium: 142 mmol/L (ref 135–146)
eGFR: 106 mL/min/{1.73_m2} (ref >=60)

## 2021-03-09 LAB — HEMOGLOBIN A1C
Hgb A1c MFr Bld: 5.9 % of total Hgb — ABNORMAL HIGH (ref <5.7)
Mean Plasma Glucose: 123 mg/dL
eAG (mmol/L): 6.8 mmol/L

## 2021-03-18 ENCOUNTER — Ambulatory Visit: Payer: BC Managed Care – PPO | Admitting: Gastroenterology

## 2021-04-12 ENCOUNTER — Ambulatory Visit
Admission: EM | Admit: 2021-04-12 | Discharge: 2021-04-12 | Disposition: A | Discharge status: Home / Self Care | Payer: BC Managed Care – PPO | Attending: Family Medicine | Admitting: Family Medicine

## 2021-04-12 ENCOUNTER — Encounter: Payer: Self-pay | Admitting: Family Medicine

## 2021-04-12 ENCOUNTER — Other Ambulatory Visit: Payer: Self-pay

## 2021-04-12 DIAGNOSIS — J0100 Acute maxillary sinusitis, unspecified: Secondary | ICD-10-CM | POA: Diagnosis not present

## 2021-04-12 DIAGNOSIS — J069 Acute upper respiratory infection, unspecified: Secondary | ICD-10-CM

## 2021-04-12 DIAGNOSIS — H9190 Unspecified hearing loss, unspecified ear: Secondary | ICD-10-CM

## 2021-04-12 MED ORDER — FLUTICASONE PROPIONATE 50 MCG/ACT NA SUSP: 1.0000 | Freq: Two times a day (BID) | NASAL | 0 refills | Status: DC

## 2021-04-12 MED ORDER — GUAIFENESIN ER 600 MG PO TB12: 600.0000 mg | ORAL_TABLET | Freq: Two times a day (BID) | ORAL | 0 refills | Status: DC | PRN

## 2021-04-12 MED ORDER — AZITHROMYCIN 250 MG PO TABS: ORAL_TABLET | ORAL | 0 refills | Status: DC

## 2021-04-12 NOTE — ED Triage Notes (Signed)
Pt presents with c/o cough sore throat , headache and nasal congestion for past week, negative home covid test

## 2021-04-12 NOTE — ED Provider Notes (Signed)
[facility]    CSN: [id number] Arrival date & time: 04/12/21  0802      History   Chief Complaint Chief Complaint  Patient presents with   Sore Throat   Headache   Cough    HPI [name] is a 56 y.o. female.   Presenting today with over a week of progressively worsening sore throat, hoarseness, sinus headache, nasal congestion, sinus pain and pressure.  Denies fever, chills, chest pain, shortness of breath, abdominal pain, nausea vomiting or diarrhea.  So far trying over-the-counter Coricidin HBP with minimal relief.  Took a home COVID test that was negative.  No known sick contacts recently.   Past Medical History:  Diagnosis Date   Anxiety    Depression    Diabetes mellitus without complication ([place])    Encounter for neuropsychological testing    at [facility] 3/20-suggest possible somatization   Headache    Hypertension    Migraines    Pseudoseizure    PTSD (post-traumatic stress disorder)    Rosacea    Seizures ([name])    [facility] (Dr. 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 on eeg   Sleep apnea     Patient Active Problem List   Diagnosis Date Noted   Migraine without aura and without status migrainosus, not intractable 08/24/2020   History of head injury 08/24/2020   Intractable chronic post-traumatic headache 04/20/2020   History of subarachnoid hemorrhage 04/20/2020   COVID-19 virus infection 12/05/2019   New onset type 2 diabetes mellitus ([name]) 11/12/2019   Subarachnoid hemorrhage following injury 11/11/2019   Class 1 obesity due to excess calories with body mass index (BMI) of 34.0 to 34.9 in adult 11/11/2019   Encounter for monitoring postmenopausal estrogen replacement therapy 01/27/2019   Weight loss counseling, encounter for 01/27/2019   Vaginal odor 12/02/2018   Hot flashes due to menopause 12/02/2018   No energy 12/02/2018   Decreased libido 12/02/2018   Counseling for estrogen  replacement therapy 12/02/2018   History of seizure 12/02/2018   Encounter for neuropsychological testing    Lumbar stenosis with neurogenic claudication 05/23/2018   Pain in left knee 07/26/2017   Seizures ([place])    Psychogenic nonepileptic seizure 11/13/2015   Jerking movements of extremities 11/11/2015   Major depressive disorder, recurrent severe without psychotic features ([name])    Bipolar II disorder ([name]) 08/30/2015   Suicide attempt by drug ingestion ([name]) 08/30/2015   Suicidal ideation 08/30/2015   Migraines 09/29/2014   Migraine 05/19/2013   Left-sided weakness 05/18/2013   Numbness and tingling of left arm and leg 05/18/2013   CVA (cerebral infarction) 05/18/2013   Hypertension    Anxiety     Past Surgical History:  Procedure Laterality Date   ABDOMINAL HYSTERECTOMY     non cancerous, partial   APPENDECTOMY     appendectomy     BACK SURGERY     x2   CHOLECYSTECTOMY     CHOLECYSTECTOMY     CYST EXCISION     on ovaries   lumbar     ruptured disc in lumbar taken out   ROTATOR CUFF REPAIR Left    TONSILLECTOMY     TUBAL LIGATION      OB History     Gravida  2   Para  2   Term      Preterm      AB      Living  2      SAB  IAB      Ectopic      Multiple      Live Births               Home Medications    Prior to Admission medications   Medication Sig Start Date End Date Taking? Authorizing Provider  azithromycin (ZITHROMAX) 250 MG tablet Take first 2 tablets together, then 1 every day until finished. 04/12/21  Yes Volney American, PA-C  fluticasone [facility]) 50 MCG/ACT nasal spray Place 1 spray into both nostrils 2 (two) times daily. 04/12/21  Yes Volney American, PA-C  guaiFENesin (MUCINEX) 600 MG 12 hr tablet Take 1 tablet (600 mg total) by mouth 2 (two) times daily as needed. 04/12/21  Yes Volney American, PA-C  ACCU-CHEK GUIDE test strip USE AS DIRECTED TO MONITOR FSBS 1X DAILY. DX: R73.09. 02/01/21   [name], MD  amLODipine (NORVASC) 5 MG tablet TAKE 1 TABLET IN THE MORNING & 1/2 A TABLET IN THE EVENING 08/27/20   Fay Records, MD  atorvastatin (LIPITOR) 20 MG tablet Take 1 tablet (20 mg total) by mouth daily. 03/08/21   [name], MD  Blood Glucose Monitoring Suppl (BLOOD GLUCOSE SYSTEM PAK) KIT Please dispense based on patient and insurance preference. Use as directed to monitor FSBS 1x daily. Dx: R73.09. 12/01/19   [name], MD  butalbital-acetaminophen-caffeine (FIORICET) [phone] MG tablet One or two po prn headache.  No more than 20/month 08/24/20   [name], [name], MD  furosemide (LASIX) 40 MG tablet TAKE 1 TABLET BY MOUTH DAILY. STOP HCTZ 12/17/19   [name], MD  HYDROcodone-acetaminophen (NORCO/VICODIN) 5-325 MG tablet Take 1 tablet by mouth every 4 (four) hours as needed. 11/17/19   [name], MD  Lancets MISC Please dispense based on patient and insurance preference. Use as directed to monitor FSBS 1x daily. Dx: R73.09. 12/01/19   [name], MD  LORazepam (ATIVAN) 0.5 MG tablet Take 1 tablet (0.5 mg total) by mouth every 8 (eight) hours as needed for anxiety. 04/05/20   [name], MD  ondansetron (ZOFRAN) 4 MG tablet Take 1 tablet (4 mg total) by mouth every 8 (eight) hours as needed for nausea or vomiting. 02/14/21   [name], MD  promethazine (PHENERGAN) 25 MG tablet Take 1 tablet (25 mg total) by mouth every 6 (six) hours as needed for nausea or vomiting. 12/06/20   [name], MD  sitaGLIPtin (JANUVIA) 100 MG tablet Take 1 tablet (100 mg total) by mouth daily. 03/08/21   [name], MD  venlafaxine XR (EFFEXOR-XR) 150 MG 24 hr capsule Take 2 capsules (300 mg total) by mouth daily with breakfast. 01/19/21   [name], MD  zonisamide (ZONEGRAN) 100 MG capsule Take $RemoveBefo'200mg'woeHxFyHOKg$  daily at bedtime 02/23/21   [name], [name], NP    Family History Family History  Problem Relation Age of Onset   Diabetes Father    Heart  disease Father 46   Hypertension Father    Cancer Maternal Grandfather        colon cancer (70's)   Diabetes Paternal Grandmother    Diabetes Paternal Grandfather    Cancer Cousin        breast   Breast cancer Cousin    Breast cancer Paternal Aunt    Breast cancer Cousin    Breast cancer Cousin     Social History Social History   Tobacco Use   Smoking status:  Former    Packs/day: 0.50    Years: 10.00    Pack years: 5.00    Types: Cigarettes    Quit date: 05/15/1991    Years since quitting: 29.9   Smokeless tobacco: Never  Vaping Use   Vaping Use: Never used  Substance Use Topics   Alcohol use: Not Currently    Alcohol/week: 0.0 standard drinks    Comment: occassionally/socially   Drug use: No     Allergies   Penicillins, Toradol [ketorolac tromethamine], Lamotrigine, and Zofran [ondansetron hcl]   Review of Systems Review of Systems Per HPI  Physical Exam Triage Vital Signs ED Triage Vitals  Enc Vitals Group     BP 04/12/21 0816 (!) 141/87     Pulse Rate 04/12/21 0816 87     Resp 04/12/21 0816 18     Temp 04/12/21 0816 97.8 F (36.6 C)     Temp src --      SpO2 04/12/21 0816 98 %     Weight --      Height --      Head Circumference --      Peak Flow --      Pain Score 04/12/21 0813 8     Pain Loc --      Pain Edu? --      Excl. in [place]? --    No data found.  Updated Vital Signs BP (!) 141/87    Pulse 87    Temp 97.8 F (36.6 C)    Resp 18    SpO2 98%   Visual Acuity Right Eye Distance:   Left Eye Distance:   Bilateral Distance:    Right Eye Near:   Left Eye Near:    Bilateral Near:     Physical Exam Vitals and nursing note reviewed.  Constitutional:      Appearance: Normal appearance.  HENT:     Head: Atraumatic.     Right Ear: Tympanic membrane and external ear normal.     Left Ear: Tympanic membrane and external ear normal.     Nose: Congestion present.     Mouth/Throat:     Mouth: Mucous membranes are moist.     Pharynx:  Posterior oropharyngeal erythema present.  Eyes:     Extraocular Movements: Extraocular movements intact.     Conjunctiva/sclera: Conjunctivae normal.  Cardiovascular:     Rate and Rhythm: Normal rate and regular rhythm.     Heart sounds: Normal heart sounds.  Pulmonary:     Effort: Pulmonary effort is normal.     Breath sounds: Normal breath sounds. No wheezing or rales.  Musculoskeletal:        General: Normal range of motion.     Cervical back: Normal range of motion and neck supple.  Skin:    General: Skin is warm and dry.  Neurological:     Mental Status: She is alert and oriented to person, place, and time.  Psychiatric:        Mood and Affect: Mood normal.        Thought Content: Thought content normal.     UC Treatments / Results  Labs (all labs ordered are listed, but only abnormal results are displayed) Labs Reviewed  COVID-19, FLU A+B NAA    EKG   Radiology No results found.  Procedures Procedures (including critical care time)  Medications Ordered in UC Medications - No data to display  Initial Impression / Assessment and Plan / UC Course  I have  reviewed the triage vital signs and the nursing notes.  Pertinent labs & imaging results that were available during my care of the patient were reviewed by me and considered in my medical decision making (see chart for details).     Suspect viral upper respiratory infection progressing into a maxillary sinusitis.  COVID and flu testing pending for FYI purposes, patient aware that this would not change management even if positive.  We will treat with azithromycin, Flonase, Mucinex, supportive home care and return precautions reviewed.  Final Clinical Impressions(s) / UC Diagnoses   Final diagnoses:  Viral URI with cough  Acute maxillary sinusitis, recurrence not specified   Discharge Instructions   None    ED Prescriptions     Medication Sig Dispense Auth. Provider   azithromycin (ZITHROMAX) 250 MG  tablet Take first 2 tablets together, then 1 every day until finished. 6 tablet Volney American, PA-C   fluticasone [facility]) 50 MCG/ACT nasal spray Place 1 spray into both nostrils 2 (two) times daily. 16 g Volney American, PA-C   guaiFENesin (MUCINEX) 600 MG 12 hr tablet Take 1 tablet (600 mg total) by mouth 2 (two) times daily as needed. 30 tablet Volney American, [place]      PDMP not reviewed this encounter.   Merrie Roof [place], [place] 04/12/21 [phone]

## 2021-04-13 LAB — COVID-19, FLU A+B NAA
Influenza A, NAA: NOT DETECTED
Influenza B, NAA: NOT DETECTED
SARS-CoV-2, NAA: NOT DETECTED

## 2021-04-19 DIAGNOSIS — H9011 Conductive hearing loss, unilateral, right ear, with unrestricted hearing on the contralateral side: Secondary | ICD-10-CM | POA: Diagnosis not present

## 2021-04-21 ENCOUNTER — Other Ambulatory Visit: Payer: Self-pay

## 2021-04-21 ENCOUNTER — Ambulatory Visit (INDEPENDENT_AMBULATORY_CARE_PROVIDER_SITE_OTHER): Payer: BC Managed Care – PPO | Admitting: Family Medicine

## 2021-04-21 ENCOUNTER — Encounter: Payer: Self-pay | Admitting: Family Medicine

## 2021-04-21 VITALS — BP 128/112 | HR 104 | Temp 97.3°F | Resp 18 | Ht 67.0 in | Wt 216.0 lb

## 2021-04-21 DIAGNOSIS — Z87898 Personal history of other specified conditions: Secondary | ICD-10-CM | POA: Diagnosis not present

## 2021-04-21 DIAGNOSIS — G44321 Chronic post-traumatic headache, intractable: Secondary | ICD-10-CM

## 2021-04-21 DIAGNOSIS — R413 Other amnesia: Secondary | ICD-10-CM

## 2021-04-21 DIAGNOSIS — Z87828 Personal history of other (healed) physical injury and trauma: Secondary | ICD-10-CM

## 2021-04-21 DIAGNOSIS — Z8679 Personal history of other diseases of the circulatory system: Secondary | ICD-10-CM | POA: Diagnosis not present
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
## 2021-05-04 ENCOUNTER — Other Ambulatory Visit: Payer: Self-pay | Admitting: Family Medicine

## 2021-06-21 ENCOUNTER — Other Ambulatory Visit: Payer: Self-pay | Admitting: Family Medicine

## 2021-06-24 ENCOUNTER — Ambulatory Visit (INDEPENDENT_AMBULATORY_CARE_PROVIDER_SITE_OTHER): Payer: BC Managed Care – PPO | Admitting: Family Medicine

## 2021-06-24 VITALS — BP 134/100 | HR 79 | Temp 97.8°F | Ht 67.0 in | Wt 221.6 lb

## 2021-06-24 DIAGNOSIS — M25512 Pain in left shoulder: Secondary | ICD-10-CM | POA: Diagnosis not present

## 2021-06-24 MED ORDER — HYDROCODONE-ACETAMINOPHEN 5-325 MG PO TABS: 1.0000 | ORAL_TABLET | ORAL | 0 refills | Status: DC | PRN

## 2021-06-24 NOTE — Addendum Note (Signed)
Addended by: Jenna Luo T on: 06/24/2021 02:41 PM ? ? Modules accepted: Orders ? ?

## 2021-06-24 NOTE — Progress Notes (Signed)
? ?Subjective:  ? ? Patient ID: [name], female    DOB: March 15, 1965, 56 y.o.   MRN: [id number] ?Patient reports 8 out of 10 pain in her left shoulder.  She states that is been going on now for more than a week.  She has been taking ibuprofen with no relief.  It hurts to abduct the shoulder greater than 90 degrees.  It hurts with internal and external rotation.  Even passive abduction of the shoulder are greater than 90 degrees suggesting some element of impingement syndrome.  She has pain with Hawkins maneuver.  She has pain with empty can testing.  She also has pain with passive range of motion but no palpable crepitus ? ?Past Medical History:  ?Diagnosis Date  ? Anxiety   ? Depression   ? Diabetes mellitus without complication ([place])   ? Encounter for neuropsychological testing   ? at [facility] 3/20-suggest possible somatization  ? Headache   ? Hypertension   ? Migraines   ? Pseudoseizure   ? PTSD (post-traumatic stress disorder)   ? Rosacea   ? Seizures ([place])   ? [facility] (Dr. 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 on eeg  ? Sleep apnea   ? ?Past Surgical History:  ?Procedure Laterality Date  ? ABDOMINAL HYSTERECTOMY    ? non cancerous, partial  ? APPENDECTOMY    ? appendectomy    ? BACK SURGERY    ? x2  ? CHOLECYSTECTOMY    ? CHOLECYSTECTOMY    ? CYST EXCISION    ? on ovaries  ? lumbar    ? ruptured disc in lumbar taken out  ? ROTATOR CUFF REPAIR Left   ? TONSILLECTOMY    ? TUBAL LIGATION    ? ?Current Outpatient Medications on File Prior to Visit  ?Medication Sig Dispense Refill  ? ACCU-CHEK GUIDE test strip USE AS DIRECTED TO MONITOR FSBS 1X DAILY. DX: R73.09. 100 strip 11  ? amLODipine (NORVASC) 5 MG tablet TAKE 1 TABLET IN THE MORNING & 1/2 A TABLET IN THE EVENING 30 tablet 11  ? atorvastatin (LIPITOR) 20 MG tablet Take 1 tablet (20 mg total) by mouth daily. 90 tablet 3  ? Blood Glucose Monitoring Suppl (BLOOD GLUCOSE SYSTEM PAK) KIT Please dispense  based on patient and insurance preference. Use as directed to monitor FSBS 1x daily. Dx: R73.09. 1 kit 1  ? butalbital-acetaminophen-caffeine (FIORICET) 50-325-40 MG tablet One or two po prn headache.  No more than 20/month 20 tablet 5  ? fluticasone (FLONASE) 50 MCG/ACT nasal spray PLACE 1 SPRAY INTO BOTH NOSTRILS 2 (TWO) TIMES DAILY 16 mL 11  ? furosemide (LASIX) 40 MG tablet TAKE 1 TABLET BY MOUTH DAILY. STOP HCTZ 90 tablet 1  ? HYDROcodone-acetaminophen (NORCO/VICODIN) 5-325 MG tablet Take 1 tablet by mouth every 4 (four) hours as needed. 10 tablet 0  ? Lancets MISC Please dispense based on patient and insurance preference. Use as directed to monitor FSBS 1x daily. Dx: R73.09. 50 each 0  ? LORazepam (ATIVAN) 0.5 MG tablet Take 1 tablet (0.5 mg total) by mouth every 8 (eight) hours as needed for anxiety. 30 tablet 0  ? ondansetron (ZOFRAN) 4 MG tablet Take 1 tablet (4 mg total) by mouth every 8 (eight) hours as needed for nausea or vomiting. 20 tablet 0  ? promethazine (PHENERGAN) 25 MG tablet TAKE 1 TABLET BY MOUTH EVERY 6 HOURS AS NEEDED FOR NAUSEA OR VOMITING. 30 tablet 0  ? sitaGLIPtin (JANUVIA)  100 MG tablet Take 1 tablet (100 mg total) by mouth daily. 90 tablet 3  ? venlafaxine XR (EFFEXOR-XR) 150 MG 24 hr capsule Take 2 capsules (300 mg total) by mouth daily with breakfast. 180 capsule 1  ? zonisamide (ZONEGRAN) 100 MG capsule Take 233m daily at bedtime 180 capsule 3  ? ?No current facility-administered medications on file prior to visit.  ? ?Allergies  ?Allergen Reactions  ? Penicillins Hives  ?  Has patient had a PCN reaction causing immediate rash, facial/tongue/throat swelling, SOB or lightheadedness with hypotension: Yes ?Has patient had a PCN reaction causing severe rash involving mucus membranes or skin necrosis: No ?Has patient had a PCN reaction that required hospitalization No ?Has patient had a PCN reaction occurring within the last 10 years: No ?If all of the above answers are "NO", then may  proceed with Cephalosporin use. ? ?  ? Toradol [Ketorolac Tromethamine] Hives  ? Lamotrigine Rash  ? Zofran [Ondansetron Hcl] Itching  ? ?Social History  ? ?Socioeconomic History  ? Marital status: Married  ?  Spouse name: Not on file  ? Number of children: 2  ? Years of education: Associates   ? Highest education level: Not on file  ?Occupational History  ? Not on file  ?Tobacco Use  ? Smoking status: Former  ?  Packs/day: 0.50  ?  Years: 10.00  ?  Pack years: 5.00  ?  Types: Cigarettes  ?  Quit date: 05/15/1991  ?  Years since quitting: 30.1  ? Smokeless tobacco: Never  ?Vaping Use  ? Vaping Use: Never used  ?Substance and Sexual Activity  ? Alcohol use: Not Currently  ?  Alcohol/week: 0.0 standard drinks  ?  Comment: occassionally/socially  ? Drug use: No  ? Sexual activity: Yes  ?  Birth control/protection: Surgical  ?Other Topics Concern  ? Not on file  ?Social History Narrative  ? Caffeine use: tea sometimes, soda sometimes  ? Right handed  ? Married x 5 years 01/27/21  ? 1 son and 1 daughter. 5 grandchildren.   ? ?Social Determinants of Health  ? ?Financial Resource Strain: Low Risk   ? Difficulty of Paying Living Expenses: Not hard at all  ?Food Insecurity: No Food Insecurity  ? Worried About RCharity fundraiserin the Last Year: Never true  ? Ran Out of Food in the Last Year: Never true  ?Transportation Needs: No Transportation Needs  ? Lack of Transportation (Medical): No  ? Lack of Transportation (Non-Medical): No  ?Physical Activity: Insufficiently Active  ? Days of Exercise per Week: 3 days  ? Minutes of Exercise per Session: 30 min  ?Stress: No Stress Concern Present  ? Feeling of Stress : Only a little  ?Social Connections: Socially Integrated  ? Frequency of Communication with Friends and Family: More than three times a week  ? Frequency of Social Gatherings with Friends and Family: More than three times a week  ? Attends Religious Services: 1 to 4 times per year  ? Active Member of Clubs or  Organizations: Yes  ? Attends CArchivistMeetings: 1 to 4 times per year  ? Marital Status: Married  ?Intimate Partner Violence: Not At Risk  ? Fear of Current or Ex-Partner: No  ? Emotionally Abused: No  ? Physically Abused: No  ? Sexually Abused: No  ? ? ? ? ?Review of Systems  ?All other systems reviewed and are negative. ? ?   ?Objective:  ? ? ?Physical Exam ?  Vitals reviewed.  ?Constitutional:   ?   General: She is not in acute distress. ?   Appearance: Normal appearance. She is normal weight. She is not ill-appearing, toxic-appearing or diaphoretic.  ?HENT:  ?   Head: Normocephalic.  ?Cardiovascular:  ?   Rate and Rhythm: Normal rate and regular rhythm.  ?   Heart sounds: No murmur heard.  ? ?Pulmonary:  ?   Effort: Pulmonary effort is normal. No respiratory distress.  ?   Breath sounds: Normal breath sounds.  ? ?Please see history of present illness, the patient has signs of impingement syndrome with pain with passive range of motion in abduction greater than 90 degrees.  Pain with resisted abduction.  Pain with Hawkins maneuver and positive empty can sign ? ? ? ?   ?A/p ?Acute pain of left shoulder ? 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  We discussed options and patient elects to receive a cortisone injection.  Using sterile technique, I injected the subacromial space with 2 cc of lidocaine, 2 cc of Marcaine, and 2 cc of 40 mg/mL Kenalog. ?

## 2021-06-27 ENCOUNTER — Encounter: Payer: Self-pay | Admitting: Family Medicine

## 2021-06-27 ENCOUNTER — Ambulatory Visit: Payer: Medicare Other | Admitting: Family Medicine

## 2021-06-27 DIAGNOSIS — M25512 Pain in left shoulder: Secondary | ICD-10-CM

## 2021-06-28 NOTE — Telephone Encounter (Signed)
Referral order for Dr Augustin Schooling with [facility]  ?

## 2021-07-02 NOTE — H&P (View-Only) (Signed)
? ?Referring Provider:  [name], MD ?Primary Care Physician:  [name], MD ?Primary Gastroenterologist:  Dr. Gala Romney ? ?Chief Complaint  ?Patient presents with  ? Colonoscopy  ? ? ?HPI:   ?[name] is a 56 y.o. female presenting today at the request of  [name], MD for colon cancer screening. ? ?Patient reports colonoscopy over 10 years ago at [facility]. Pretty sure she had polyps. Also states she had to go back to the hospital due to bleeding issues thereafter.  ? ?Reports chronic history of constipation. Bowels move every 3-4 days. Stools can be hard or soft. Doesn't take anything for constipation. MirALAX and stool softeners have helped in the past. No brbpr or melena. Intermittent lower abdominal pain when constipated. This improves with a BM.  ? ?Nausea with headaches. No vomiting, dysphagia, or heartburn.  ? ?Elevated LFTs:  ?Alk phos elevated at 182 and ALT elevated at 30 in August 2022.  In December 2021, ALT elevated at 36, alk phos within normal limits.  RUQ ultrasound August 2022 with fatty liver. Weight is staying the same. Wants to lose weight, but having trouble with this. No routine alcohol use. Maybe once every few months. No Hx of illicit drug use. Takes 1000 mg tylenol couple days a week for HAs.  No OTC supplements, herbal teas.  ? ?Past Medical History:  ?Diagnosis Date  ? Anxiety   ? Depression   ? Diabetes mellitus without complication ([place])   ? Encounter for neuropsychological testing   ? at [facility] 3/20-suggest possible somatization  ? Headache   ? Hypertension   ? Migraines   ? Pseudoseizure   ? PTSD (post-traumatic stress disorder)   ? Rosacea   ? Seizures ([place])   ? [facility] (Dr. 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 on eeg  ? Sleep apnea   ? ? ?Past Surgical History:  ?Procedure Laterality Date  ? ABDOMINAL HYSTERECTOMY    ? non cancerous, partial  ? APPENDECTOMY    ? BACK SURGERY    ? x2  ?  CHOLECYSTECTOMY    ? CYST EXCISION    ? on ovaries  ? lumbar    ? ruptured disc in lumbar taken out  ? ROTATOR CUFF REPAIR Left   ? TONSILLECTOMY    ? TUBAL LIGATION    ? ? ?Current Outpatient Medications  ?Medication Sig Dispense Refill  ? ACCU-CHEK GUIDE test strip USE AS DIRECTED TO MONITOR FSBS 1X DAILY. DX: R73.09. 100 strip 11  ? amLODipine (NORVASC) 5 MG tablet TAKE 1 TABLET IN THE MORNING & 1/2 A TABLET IN THE EVENING 30 tablet 11  ? atorvastatin (LIPITOR) 20 MG tablet Take 1 tablet (20 mg total) by mouth daily. 90 tablet 3  ? Blood Glucose Monitoring Suppl (BLOOD GLUCOSE SYSTEM PAK) KIT Please dispense based on patient and insurance preference. Use as directed to monitor FSBS 1x daily. Dx: R73.09. 1 kit 1  ? butalbital-acetaminophen-caffeine (FIORICET) 50-325-40 MG tablet One or two po prn headache.  No more than 20/month 20 tablet 5  ? fluticasone (FLONASE) 50 MCG/ACT nasal spray PLACE 1 SPRAY INTO BOTH NOSTRILS 2 (TWO) TIMES DAILY 16 mL 11  ? furosemide (LASIX) 40 MG tablet TAKE 1 TABLET BY MOUTH DAILY. STOP HCTZ 90 tablet 1  ? Lancets MISC Please dispense based on patient and insurance preference. Use as directed to monitor FSBS 1x daily. Dx: R73.09. 50 each 0  ? LORazepam (ATIVAN) 0.5  MG tablet Take 1 tablet (0.5 mg total) by mouth every 8 (eight) hours as needed for anxiety. 30 tablet 0  ? ondansetron (ZOFRAN) 4 MG tablet Take 1 tablet (4 mg total) by mouth every 8 (eight) hours as needed for nausea or vomiting. 20 tablet 0  ? promethazine (PHENERGAN) 25 MG tablet TAKE 1 TABLET BY MOUTH EVERY 6 HOURS AS NEEDED FOR NAUSEA OR VOMITING. 30 tablet 0  ? sitaGLIPtin (JANUVIA) 100 MG tablet Take 1 tablet (100 mg total) by mouth daily. 90 tablet 3  ? venlafaxine XR (EFFEXOR-XR) 150 MG 24 hr capsule Take 2 capsules (300 mg total) by mouth daily with breakfast. 180 capsule 1  ? zonisamide (ZONEGRAN) 100 MG capsule Take 289m daily at bedtime 180 capsule 3  ? ?No current facility-administered medications for this  visit.  ? ? ?Allergies as of 07/04/2021 - Review Complete 07/04/2021  ?Allergen Reaction Noted  ? Penicillins Hives 11/14/2010  ? Toradol [ketorolac tromethamine] Hives 03/14/2016  ? Lamotrigine Rash 12/23/2015  ? Zofran [ondansetron hcl] Itching 05/31/2016  ? ? ?Family History  ?Problem Relation Age of Onset  ? Diabetes Father   ? Heart disease Father 571 ? Hypertension Father   ? Cancer Maternal Grandfather   ?     colon cancer (716's  ? Diabetes Paternal Grandmother   ? Diabetes Paternal Grandfather   ? Cancer Cousin   ?     breast  ? Breast cancer Cousin   ? Breast cancer Paternal Aunt   ? Breast cancer Cousin   ? Breast cancer Cousin   ? ? ?Social History  ? ?Socioeconomic History  ? Marital status: Married  ?  Spouse name: Not on file  ? Number of children: 2  ? Years of education: Associates   ? Highest education level: Not on file  ?Occupational History  ? Not on file  ?Tobacco Use  ? Smoking status: Former  ?  Packs/day: 0.50  ?  Years: 10.00  ?  Pack years: 5.00  ?  Types: Cigarettes  ?  Quit date: 05/15/1991  ?  Years since quitting: 30.1  ? Smokeless tobacco: Never  ?Vaping Use  ? Vaping Use: Never used  ?Substance and Sexual Activity  ? Alcohol use: Yes  ?  Comment: occassionally/socially, once every few months.  ? Drug use: No  ? Sexual activity: Yes  ?  Birth control/protection: Surgical  ?Other Topics Concern  ? Not on file  ?Social History Narrative  ? Caffeine use: tea sometimes, soda sometimes  ? Right handed  ? Married x 5 years 01/27/21  ? 1 son and 1 daughter. 5 grandchildren.   ? ?Social Determinants of Health  ? ?Financial Resource Strain: Low Risk   ? Difficulty of Paying Living Expenses: Not hard at all  ?Food Insecurity: No Food Insecurity  ? Worried About RCharity fundraiserin the Last Year: Never true  ? Ran Out of Food in the Last Year: Never true  ?Transportation Needs: No Transportation Needs  ? Lack of Transportation (Medical): No  ? Lack of Transportation (Non-Medical): No   ?Physical Activity: Insufficiently Active  ? Days of Exercise per Week: 3 days  ? Minutes of Exercise per Session: 30 min  ?Stress: No Stress Concern Present  ? Feeling of Stress : Only a little  ?Social Connections: Socially Integrated  ? Frequency of Communication with Friends and Family: More than three times a week  ? Frequency of Social Gatherings with Friends and  Family: More than three times a week  ? Attends Religious Services: 1 to 4 times per year  ? Active Member of Clubs or Organizations: Yes  ? Attends Archivist Meetings: 1 to 4 times per year  ? Marital Status: Married  ?Intimate Partner Violence: Not At Risk  ? Fear of Current or Ex-Partner: No  ? Emotionally Abused: No  ? Physically Abused: No  ? Sexually Abused: No  ? ? ?Review of Systems: ?Gen: Denies any fever, chills, cold or flulike symptoms, presyncope, syncope. ?CV: Denies chest pain, heart palpitations. ?Resp: Denies shortness of breath or cough. ?GI: See HPI ?GU : Denies urinary burning, urinary frequency, urinary hesitancy ?MS: Denies joint pain. ?Derm: Denies rash. ?Psych: Admits to history of anxiety/depression, doing well with medications.  ?Heme: See HPI ? ?Physical Exam: ?BP (!) 136/93   Pulse 92   Temp (!) 97.4 ?F (36.3 ?C) (Temporal)   Ht '5\' 7"'  (1.702 m)   Wt 219 lb (99.3 kg)   BMI 34.30 kg/m?  ?General:   Alert and oriented. Pleasant and cooperative. Well-nourished and well-developed.  ?Head:  Normocephalic and atraumatic. ?Eyes:  Without icterus, sclera clear and conjunctiva pink.  ?Ears:  Normal auditory acuity. ?Lungs:  Clear to auscultation bilaterally. No wheezes, rales, or rhonchi. No distress.  ?Heart:  S1, S2 present without murmurs appreciated.  ?Abdomen:  +BS, soft, non-tender and non-distended. No HSM noted. No guarding or rebound. No masses appreciated.  ?Rectal:  Deferred  ?Msk:  Symmetrical without gross deformities. Normal posture. ?Extremities:  Without edema. ?Neurologic:  Alert and  oriented x4;   grossly normal neurologically. ?Skin:  Intact without significant lesions or rashes. ?Psych: Normal mood and affect. ? ? ? ?Assessment:  ?56 year old female with history of diabetes, seizures, HTN, anxiety/depression, PTS

## 2021-07-02 NOTE — Progress Notes (Signed)
? ?Referring Provider:  [name], MD ?Primary Care Physician:  [name], MD ?Primary Gastroenterologist:  Dr. Gala Romney ? ?Chief Complaint  ?Patient presents with  ? Colonoscopy  ? ? ?HPI:   ?[name] is a 56 y.o. female presenting today at the request of  [name], MD for colon cancer screening. ? ?Patient reports colonoscopy over 10 years ago at [facility]. Pretty sure she had polyps. Also states she had to go back to the hospital due to bleeding issues thereafter.  ? ?Reports chronic history of constipation. Bowels move every 3-4 days. Stools can be hard or soft. Doesn't take anything for constipation. MirALAX and stool softeners have helped in the past. No brbpr or melena. Intermittent lower abdominal pain when constipated. This improves with a BM.  ? ?Nausea with headaches. No vomiting, dysphagia, or heartburn.  ? ?Elevated LFTs:  ?Alk phos elevated at 182 and ALT elevated at 30 in August 2022.  In December 2021, ALT elevated at 36, alk phos within normal limits.  RUQ ultrasound August 2022 with fatty liver. Weight is staying the same. Wants to lose weight, but having trouble with this. No routine alcohol use. Maybe once every few months. No Hx of illicit drug use. Takes 1000 mg tylenol couple days a week for HAs.  No OTC supplements, herbal teas.  ? ?Past Medical History:  ?Diagnosis Date  ? Anxiety   ? Depression   ? Diabetes mellitus without complication ([place])   ? Encounter for neuropsychological testing   ? at [facility] 3/20-suggest possible somatization  ? Headache   ? Hypertension   ? Migraines   ? Pseudoseizure   ? PTSD (post-traumatic stress disorder)   ? Rosacea   ? Seizures ([place])   ? [facility] (Dr. 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 on eeg  ? Sleep apnea   ? ? ?Past Surgical History:  ?Procedure Laterality Date  ? ABDOMINAL HYSTERECTOMY    ? non cancerous, partial  ? APPENDECTOMY    ? BACK SURGERY    ? x2  ?  CHOLECYSTECTOMY    ? CYST EXCISION    ? on ovaries  ? lumbar    ? ruptured disc in lumbar taken out  ? ROTATOR CUFF REPAIR Left   ? TONSILLECTOMY    ? TUBAL LIGATION    ? ? ?Current Outpatient Medications  ?Medication Sig Dispense Refill  ? ACCU-CHEK GUIDE test strip USE AS DIRECTED TO MONITOR FSBS 1X DAILY. DX: R73.09. 100 strip 11  ? amLODipine (NORVASC) 5 MG tablet TAKE 1 TABLET IN THE MORNING & 1/2 A TABLET IN THE EVENING 30 tablet 11  ? atorvastatin (LIPITOR) 20 MG tablet Take 1 tablet (20 mg total) by mouth daily. 90 tablet 3  ? Blood Glucose Monitoring Suppl (BLOOD GLUCOSE SYSTEM PAK) KIT Please dispense based on patient and insurance preference. Use as directed to monitor FSBS 1x daily. Dx: R73.09. 1 kit 1  ? butalbital-acetaminophen-caffeine (FIORICET) 50-325-40 MG tablet One or two po prn headache.  No more than 20/month 20 tablet 5  ? fluticasone (FLONASE) 50 MCG/ACT nasal spray PLACE 1 SPRAY INTO BOTH NOSTRILS 2 (TWO) TIMES DAILY 16 mL 11  ? furosemide (LASIX) 40 MG tablet TAKE 1 TABLET BY MOUTH DAILY. STOP HCTZ 90 tablet 1  ? Lancets MISC Please dispense based on patient and insurance preference. Use as directed to monitor FSBS 1x daily. Dx: R73.09. 50 each 0  ? LORazepam (ATIVAN) 0.5  MG tablet Take 1 tablet (0.5 mg total) by mouth every 8 (eight) hours as needed for anxiety. 30 tablet 0  ? ondansetron (ZOFRAN) 4 MG tablet Take 1 tablet (4 mg total) by mouth every 8 (eight) hours as needed for nausea or vomiting. 20 tablet 0  ? promethazine (PHENERGAN) 25 MG tablet TAKE 1 TABLET BY MOUTH EVERY 6 HOURS AS NEEDED FOR NAUSEA OR VOMITING. 30 tablet 0  ? sitaGLIPtin (JANUVIA) 100 MG tablet Take 1 tablet (100 mg total) by mouth daily. 90 tablet 3  ? venlafaxine XR (EFFEXOR-XR) 150 MG 24 hr capsule Take 2 capsules (300 mg total) by mouth daily with breakfast. 180 capsule 1  ? zonisamide (ZONEGRAN) 100 MG capsule Take 274m daily at bedtime 180 capsule 3  ? ?No current facility-administered medications for this  visit.  ? ? ?Allergies as of 07/04/2021 - Review Complete 07/04/2021  ?Allergen Reaction Noted  ? Penicillins Hives 11/14/2010  ? Toradol [ketorolac tromethamine] Hives 03/14/2016  ? Lamotrigine Rash 12/23/2015  ? Zofran [ondansetron hcl] Itching 05/31/2016  ? ? ?Family History  ?Problem Relation Age of Onset  ? Diabetes Father   ? Heart disease Father 572 ? Hypertension Father   ? Cancer Maternal Grandfather   ?     colon cancer (758's  ? Diabetes Paternal Grandmother   ? Diabetes Paternal Grandfather   ? Cancer Cousin   ?     breast  ? Breast cancer Cousin   ? Breast cancer Paternal Aunt   ? Breast cancer Cousin   ? Breast cancer Cousin   ? ? ?Social History  ? ?Socioeconomic History  ? Marital status: Married  ?  Spouse name: Not on file  ? Number of children: 2  ? Years of education: Associates   ? Highest education level: Not on file  ?Occupational History  ? Not on file  ?Tobacco Use  ? Smoking status: Former  ?  Packs/day: 0.50  ?  Years: 10.00  ?  Pack years: 5.00  ?  Types: Cigarettes  ?  Quit date: 05/15/1991  ?  Years since quitting: 30.1  ? Smokeless tobacco: Never  ?Vaping Use  ? Vaping Use: Never used  ?Substance and Sexual Activity  ? Alcohol use: Yes  ?  Comment: occassionally/socially, once every few months.  ? Drug use: No  ? Sexual activity: Yes  ?  Birth control/protection: Surgical  ?Other Topics Concern  ? Not on file  ?Social History Narrative  ? Caffeine use: tea sometimes, soda sometimes  ? Right handed  ? Married x 5 years 01/27/21  ? 1 son and 1 daughter. 5 grandchildren.   ? ?Social Determinants of Health  ? ?Financial Resource Strain: Low Risk   ? Difficulty of Paying Living Expenses: Not hard at all  ?Food Insecurity: No Food Insecurity  ? Worried About RCharity fundraiserin the Last Year: Never true  ? Ran Out of Food in the Last Year: Never true  ?Transportation Needs: No Transportation Needs  ? Lack of Transportation (Medical): No  ? Lack of Transportation (Non-Medical): No   ?Physical Activity: Insufficiently Active  ? Days of Exercise per Week: 3 days  ? Minutes of Exercise per Session: 30 min  ?Stress: No Stress Concern Present  ? Feeling of Stress : Only a little  ?Social Connections: Socially Integrated  ? Frequency of Communication with Friends and Family: More than three times a week  ? Frequency of Social Gatherings with Friends and  Family: More than three times a week  ? Attends Religious Services: 1 to 4 times per year  ? Active Member of Clubs or Organizations: Yes  ? Attends Archivist Meetings: 1 to 4 times per year  ? Marital Status: Married  ?Intimate Partner Violence: Not At Risk  ? Fear of Current or Ex-Partner: No  ? Emotionally Abused: No  ? Physically Abused: No  ? Sexually Abused: No  ? ? ?Review of Systems: ?Gen: Denies any fever, chills, cold or flulike symptoms, presyncope, syncope. ?CV: Denies chest pain, heart palpitations. ?Resp: Denies shortness of breath or cough. ?GI: See HPI ?GU : Denies urinary burning, urinary frequency, urinary hesitancy ?MS: Denies joint pain. ?Derm: Denies rash. ?Psych: Admits to history of anxiety/depression, doing well with medications.  ?Heme: See HPI ? ?Physical Exam: ?BP (!) 136/93   Pulse 92   Temp (!) 97.4 ?F (36.3 ?C) (Temporal)   Ht '5\' 7"'  (1.702 m)   Wt 219 lb (99.3 kg)   BMI 34.30 kg/m?  ?General:   Alert and oriented. Pleasant and cooperative. Well-nourished and well-developed.  ?Head:  Normocephalic and atraumatic. ?Eyes:  Without icterus, sclera clear and conjunctiva pink.  ?Ears:  Normal auditory acuity. ?Lungs:  Clear to auscultation bilaterally. No wheezes, rales, or rhonchi. No distress.  ?Heart:  S1, S2 present without murmurs appreciated.  ?Abdomen:  +BS, soft, non-tender and non-distended. No HSM noted. No guarding or rebound. No masses appreciated.  ?Rectal:  Deferred  ?Msk:  Symmetrical without gross deformities. Normal posture. ?Extremities:  Without edema. ?Neurologic:  Alert and  oriented x4;   grossly normal neurologically. ?Skin:  Intact without significant lesions or rashes. ?Psych: Normal mood and affect. ? ? ? ?Assessment:  ?56 year old female with history of diabetes, seizures, HTN, anxiety/depression, PTS

## 2021-07-04 ENCOUNTER — Ambulatory Visit (INDEPENDENT_AMBULATORY_CARE_PROVIDER_SITE_OTHER): Payer: BC Managed Care – PPO | Admitting: Gastroenterology

## 2021-07-04 ENCOUNTER — Telehealth: Payer: Self-pay | Admitting: *Deleted

## 2021-07-04 ENCOUNTER — Encounter: Payer: Self-pay | Admitting: Gastroenterology

## 2021-07-04 ENCOUNTER — Other Ambulatory Visit: Payer: Self-pay | Admitting: *Deleted

## 2021-07-04 VITALS — BP 136/93 | HR 92 | Temp 97.4°F | Ht 67.0 in | Wt 219.0 lb

## 2021-07-04 DIAGNOSIS — K760 Fatty (change of) liver, not elsewhere classified: Secondary | ICD-10-CM | POA: Diagnosis not present

## 2021-07-04 DIAGNOSIS — E669 Obesity, unspecified: Secondary | ICD-10-CM

## 2021-07-04 DIAGNOSIS — K5900 Constipation, unspecified: Secondary | ICD-10-CM

## 2021-07-04 DIAGNOSIS — E119 Type 2 diabetes mellitus without complications: Secondary | ICD-10-CM

## 2021-07-04 DIAGNOSIS — R7989 Other specified abnormal findings of blood chemistry: Secondary | ICD-10-CM | POA: Diagnosis not present

## 2021-07-04 DIAGNOSIS — Z86010 Personal history of colonic polyps: Secondary | ICD-10-CM

## 2021-07-04 MED ORDER — PEG 3350-KCL-NA BICARB-NACL 420 G PO SOLR: ORAL | 0 refills | Status: DC

## 2021-07-04 NOTE — Patient Instructions (Addendum)
Please have blood work completed at [facility] ? ?We will arrange for you to have a colonoscopy in the near future with Dr. Gala Romney. ?1 day prior to procedure: Take Januvia as prescribed. ?Day of procedure: Do not take morning diabetes medications. ? ?We will provide you with samples of Linzess 145 mcg.  4 days prior to starting your colon prep, take 1 capsule of Linzess 145 mcg each morning 30 minutes before breakfast.  If you start having frequent diarrhea, you do not have to take all 4 days of Linzess. ?You do not need to take MiraLAX while you are taking Linzess.  ? ?For constipation: ?Start MiraLAX 1 capful (17 g) daily in 8 ounces of water. ?Please let me know if MiraLAX doesn't work well for you.  ? ?Instructions for fatty liver: ?Recommend 1-2# weight loss per week until ideal body weight through exercise & diet. ?Low fat/cholesterol diet.   ?Avoid sweets, sodas, fruit juices, sweetened beverages like tea, etc. ?Gradually increase exercise from 15 min daily up to 1 hr per day 5 days/week. ?Continue to limit alcohol use. ? ?I am referring you to healthy weight and wellness to help with weight loss. ? ?We will see you back in 4 months.  Do not hesitate to call if you have questions or concerns prior to your next visit. ? ?It was a pleasure meeting you today! ? ?Aliene Altes, PA-C ?[facility] ? ? ?

## 2021-07-04 NOTE — Telephone Encounter (Signed)
Patient returned call. She has been scheduled for 5/17 at 10:00am. Patient reports she was not given linzess samples at her OV to start prior to prep. She will stop by to pick up[ the linzess (left at front desk). Aware needs lab work prior. Orders placed. Rx sent to [facility] per pt request for prep. Instructions sent via mychart. ?

## 2021-07-04 NOTE — Telephone Encounter (Signed)
LMOVM to call back to schedule TCS with Dr. Rourk, ASA 2 

## 2021-07-05 NOTE — Telephone Encounter (Signed)
PA submitted via ameriben. Reference #[id number]  ?

## 2021-07-07 ENCOUNTER — Other Ambulatory Visit: Payer: Self-pay | Admitting: Internal Medicine

## 2021-07-07 DIAGNOSIS — S066XAS Traumatic subarachnoid hemorrhage with loss of consciousness status unknown, sequela: Secondary | ICD-10-CM | POA: Diagnosis not present

## 2021-07-07 DIAGNOSIS — R232 Flushing: Secondary | ICD-10-CM | POA: Diagnosis not present

## 2021-07-07 DIAGNOSIS — R419 Unspecified symptoms and signs involving cognitive functions and awareness: Secondary | ICD-10-CM | POA: Diagnosis not present

## 2021-07-07 DIAGNOSIS — M545 Low back pain, unspecified: Secondary | ICD-10-CM

## 2021-07-07 DIAGNOSIS — R6883 Chills (without fever): Secondary | ICD-10-CM | POA: Diagnosis not present

## 2021-07-07 DIAGNOSIS — F445 Conversion disorder with seizures or convulsions: Secondary | ICD-10-CM | POA: Diagnosis not present

## 2021-07-12 DIAGNOSIS — K760 Fatty (change of) liver, not elsewhere classified: Secondary | ICD-10-CM | POA: Diagnosis not present

## 2021-07-12 DIAGNOSIS — R7989 Other specified abnormal findings of blood chemistry: Secondary | ICD-10-CM | POA: Diagnosis not present

## 2021-07-12 NOTE — Telephone Encounter (Signed)
Auth Not Required ?

## 2021-07-13 LAB — HEPATIC FUNCTION PANEL
AG Ratio: 1.4 (calc) (ref 1.0–2.5)
ALT: 24 U/L (ref 6–29)
AST: 17 U/L (ref 10–35)
Albumin: 3.9 g/dL (ref 3.6–5.1)
Alkaline phosphatase (APISO): 185 U/L — ABNORMAL HIGH (ref 37–153)
Bilirubin, Direct: 0.1 mg/dL (ref 0.0–0.2)
Globulin: 2.7 g/dL (calc) (ref 1.9–3.7)
Indirect Bilirubin: 0.2 mg/dL (calc) (ref 0.2–1.2)
Total Bilirubin: 0.3 mg/dL (ref 0.2–1.2)
Total Protein: 6.6 g/dL (ref 6.1–8.1)

## 2021-07-13 LAB — BASIC METABOLIC PANEL
BUN: 16 mg/dL (ref 7–25)
CO2: 25 mmol/L (ref 20–32)
Calcium: 10.2 mg/dL (ref 8.6–10.4)
Chloride: 105 mmol/L (ref 98–110)
Creat: 0.83 mg/dL (ref 0.50–1.03)
Glucose, Bld: 148 mg/dL — ABNORMAL HIGH (ref 65–99)
Potassium: 4.3 mmol/L (ref 3.5–5.3)
Sodium: 139 mmol/L (ref 135–146)

## 2021-07-19 DIAGNOSIS — M25512 Pain in left shoulder: Secondary | ICD-10-CM | POA: Insufficient documentation

## 2021-07-19 DIAGNOSIS — M5412 Radiculopathy, cervical region: Secondary | ICD-10-CM | POA: Diagnosis not present

## 2021-07-20 ENCOUNTER — Ambulatory Visit (HOSPITAL_COMMUNITY)
Admission: RE | Admit: 2021-07-20 | Discharge: 2021-07-20 | Disposition: A | Discharge status: Home / Self Care | Payer: BC Managed Care – PPO | Attending: Internal Medicine | Admitting: Internal Medicine

## 2021-07-20 ENCOUNTER — Other Ambulatory Visit: Payer: Self-pay

## 2021-07-20 ENCOUNTER — Encounter (HOSPITAL_COMMUNITY): Payer: Self-pay | Admitting: Internal Medicine

## 2021-07-20 ENCOUNTER — Encounter (HOSPITAL_COMMUNITY)
Admission: RE | Disposition: A | Discharge status: Home / Self Care | Payer: Self-pay | Source: Home / Self Care | Attending: Internal Medicine

## 2021-07-20 ENCOUNTER — Ambulatory Visit (HOSPITAL_COMMUNITY): Payer: BC Managed Care – PPO | Admitting: Anesthesiology

## 2021-07-20 DIAGNOSIS — Z1211 Encounter for screening for malignant neoplasm of colon: Secondary | ICD-10-CM | POA: Diagnosis not present

## 2021-07-20 DIAGNOSIS — R45851 Suicidal ideations: Secondary | ICD-10-CM

## 2021-07-20 DIAGNOSIS — R200 Anesthesia of skin: Secondary | ICD-10-CM

## 2021-07-20 DIAGNOSIS — Z87898 Personal history of other specified conditions: Secondary | ICD-10-CM

## 2021-07-20 DIAGNOSIS — K760 Fatty (change of) liver, not elsewhere classified: Secondary | ICD-10-CM | POA: Diagnosis not present

## 2021-07-20 DIAGNOSIS — Z860100 Personal history of colon polyps, unspecified: Secondary | ICD-10-CM

## 2021-07-20 DIAGNOSIS — D124 Benign neoplasm of descending colon: Secondary | ICD-10-CM | POA: Insufficient documentation

## 2021-07-20 DIAGNOSIS — Z8679 Personal history of other diseases of the circulatory system: Secondary | ICD-10-CM

## 2021-07-20 DIAGNOSIS — E669 Obesity, unspecified: Secondary | ICD-10-CM | POA: Insufficient documentation

## 2021-07-20 DIAGNOSIS — K5730 Diverticulosis of large intestine without perforation or abscess without bleeding: Secondary | ICD-10-CM | POA: Insufficient documentation

## 2021-07-20 DIAGNOSIS — D120 Benign neoplasm of cecum: Secondary | ICD-10-CM | POA: Diagnosis not present

## 2021-07-20 DIAGNOSIS — K5909 Other constipation: Secondary | ICD-10-CM | POA: Diagnosis not present

## 2021-07-20 DIAGNOSIS — Z87891 Personal history of nicotine dependence: Secondary | ICD-10-CM | POA: Diagnosis not present

## 2021-07-20 DIAGNOSIS — R202 Paresthesia of skin: Secondary | ICD-10-CM

## 2021-07-20 DIAGNOSIS — Z6834 Body mass index (BMI) 34.0-34.9, adult: Secondary | ICD-10-CM | POA: Insufficient documentation

## 2021-07-20 DIAGNOSIS — Z7984 Long term (current) use of oral hypoglycemic drugs: Secondary | ICD-10-CM | POA: Diagnosis not present

## 2021-07-20 DIAGNOSIS — R252 Cramp and spasm: Secondary | ICD-10-CM

## 2021-07-20 DIAGNOSIS — E119 Type 2 diabetes mellitus without complications: Secondary | ICD-10-CM

## 2021-07-20 DIAGNOSIS — F419 Anxiety disorder, unspecified: Secondary | ICD-10-CM

## 2021-07-20 DIAGNOSIS — G43909 Migraine, unspecified, not intractable, without status migrainosus: Secondary | ICD-10-CM

## 2021-07-20 DIAGNOSIS — R569 Unspecified convulsions: Secondary | ICD-10-CM

## 2021-07-20 DIAGNOSIS — R7989 Other specified abnormal findings of blood chemistry: Secondary | ICD-10-CM

## 2021-07-20 DIAGNOSIS — G44321 Chronic post-traumatic headache, intractable: Secondary | ICD-10-CM

## 2021-07-20 DIAGNOSIS — Z7189 Other specified counseling: Secondary | ICD-10-CM

## 2021-07-20 DIAGNOSIS — K635 Polyp of colon: Secondary | ICD-10-CM | POA: Diagnosis not present

## 2021-07-20 DIAGNOSIS — U071 COVID-19: Secondary | ICD-10-CM

## 2021-07-20 DIAGNOSIS — F445 Conversion disorder with seizures or convulsions: Secondary | ICD-10-CM

## 2021-07-20 DIAGNOSIS — R531 Weakness: Secondary | ICD-10-CM

## 2021-07-20 DIAGNOSIS — G43009 Migraine without aura, not intractable, without status migrainosus: Secondary | ICD-10-CM

## 2021-07-20 DIAGNOSIS — E6609 Other obesity due to excess calories: Secondary | ICD-10-CM

## 2021-07-20 DIAGNOSIS — M48062 Spinal stenosis, lumbar region with neurogenic claudication: Secondary | ICD-10-CM

## 2021-07-20 DIAGNOSIS — R5383 Other fatigue: Secondary | ICD-10-CM

## 2021-07-20 DIAGNOSIS — I10 Essential (primary) hypertension: Secondary | ICD-10-CM | POA: Diagnosis not present

## 2021-07-20 DIAGNOSIS — Z5181 Encounter for therapeutic drug level monitoring: Secondary | ICD-10-CM

## 2021-07-20 DIAGNOSIS — Z800 Family history of malignant neoplasm of digestive organs: Secondary | ICD-10-CM | POA: Diagnosis not present

## 2021-07-20 DIAGNOSIS — Z0189 Encounter for other specified special examinations: Secondary | ICD-10-CM

## 2021-07-20 DIAGNOSIS — M25562 Pain in left knee: Secondary | ICD-10-CM

## 2021-07-20 DIAGNOSIS — R6882 Decreased libido: Secondary | ICD-10-CM

## 2021-07-20 DIAGNOSIS — F3181 Bipolar II disorder: Secondary | ICD-10-CM

## 2021-07-20 DIAGNOSIS — K5900 Constipation, unspecified: Secondary | ICD-10-CM

## 2021-07-20 DIAGNOSIS — N951 Menopausal and female climacteric states: Secondary | ICD-10-CM

## 2021-07-20 DIAGNOSIS — F332 Major depressive disorder, recurrent severe without psychotic features: Secondary | ICD-10-CM

## 2021-07-20 DIAGNOSIS — Z713 Dietary counseling and surveillance: Secondary | ICD-10-CM

## 2021-07-20 DIAGNOSIS — N898 Other specified noninflammatory disorders of vagina: Secondary | ICD-10-CM

## 2021-07-20 DIAGNOSIS — Z87828 Personal history of other (healed) physical injury and trauma: Secondary | ICD-10-CM

## 2021-07-20 DIAGNOSIS — T50902A Poisoning by unspecified drugs, medicaments and biological substances, intentional self-harm, initial encounter: Secondary | ICD-10-CM

## 2021-07-20 DIAGNOSIS — E66811 Obesity, class 1: Secondary | ICD-10-CM

## 2021-07-20 DIAGNOSIS — S066XAA Traumatic subarachnoid hemorrhage with loss of consciousness status unknown, initial encounter: Secondary | ICD-10-CM

## 2021-07-20 HISTORY — PX: POLYPECTOMY: SHX5525

## 2021-07-20 HISTORY — PX: COLONOSCOPY WITH PROPOFOL: SHX5780

## 2021-07-20 LAB — GLUCOSE, CAPILLARY: Glucose-Capillary: 171 mg/dL — ABNORMAL HIGH (ref 70–99)

## 2021-07-20 SURGERY — COLONOSCOPY WITH PROPOFOL
Anesthesia: General

## 2021-07-20 MED ORDER — PROPOFOL 500 MG/50ML IV EMUL
INTRAVENOUS | Status: DC | PRN
  Administered 2021-07-20: 200 ug/kg/min via INTRAVENOUS

## 2021-07-20 MED ORDER — LACTATED RINGERS IV SOLN: INTRAVENOUS | Status: DC

## 2021-07-20 MED ORDER — PROPOFOL 10 MG/ML IV BOLUS
INTRAVENOUS | Status: DC | PRN
  Administered 2021-07-20: 40 mg via INTRAVENOUS
  Administered 2021-07-20: 60 mg via INTRAVENOUS

## 2021-07-20 MED ORDER — PROPOFOL 500 MG/50ML IV EMUL
INTRAVENOUS | Status: AC
  Filled 2021-07-20: qty 50

## 2021-07-20 NOTE — Interval H&P Note (Signed)
History and Physical Interval Note: ? ?07/20/2021 ?10:02 AM ? ?[name]  has presented today for surgery, with the diagnosis of history colon polyps.  The various methods of treatment have been discussed with the patient and family. After consideration of risks, benefits and other options for treatment, the patient has consented to  Procedure(s) with comments: ?COLONOSCOPY WITH PROPOFOL (N/A) - 10:00am as a surgical intervention.  The patient's history has been reviewed, patient examined, no change in status, stable for surgery.  I have reviewed the patient's chart and labs.  Questions were answered to the patient's satisfaction.   ? ? ?Herbie Baltimore Andros Channing ? ?No change.  Surveillance colonoscopy today per plan. ?The risks, benefits, limitations, alternatives and imponderables have been reviewed with the patient. Questions have been answered. All parties are agreeable.   ?

## 2021-07-20 NOTE — Discharge Instructions (Signed)
?  Colonoscopy ?Discharge Instructions ? ?Read the instructions outlined below and refer to this sheet in the next few weeks. These discharge instructions provide you with general information on caring for yourself after you leave the hospital. Your doctor may also give you specific instructions. While your treatment has been planned according to the most current medical practices available, unavoidable complications occasionally occur. If you have any problems or questions after discharge, call Dr. Gala Romney at [phone]. ?ACTIVITY ?You may resume your regular activity, but move at a slower pace for the next 24 hours.  ?Take frequent rest periods for the next 24 hours.  ?Walking will help get rid of the air and reduce the bloated feeling in your belly (abdomen).  ?No driving for 24 hours (because of the medicine (anesthesia) used during the test).   ?Do not sign any important legal documents or operate any machinery for 24 hours (because of the anesthesia used during the test).  ?NUTRITION ?Drink plenty of fluids.  ?You may resume your normal diet as instructed by your doctor.  ?Begin with a light meal and progress to your normal diet. Heavy or fried foods are harder to digest and may make you feel sick to your stomach (nauseated).  ?Avoid alcoholic beverages for 24 hours or as instructed.  ?MEDICATIONS ?You may resume your normal medications unless your doctor tells you otherwise.  ?WHAT YOU CAN EXPECT TODAY ?Some feelings of bloating in the abdomen.  ?Passage of more gas than usual.  ?Spotting of blood in your stool or on the toilet paper.  ?IF YOU HAD POLYPS REMOVED DURING THE COLONOSCOPY: ?No aspirin products for 7 days or as instructed.  ?No alcohol for 7 days or as instructed.  ?Eat a soft diet for the next 24 hours.  ?FINDING OUT THE RESULTS OF YOUR TEST ?Not all test results are available during your visit. If your test results are not back during the visit, make an appointment with your caregiver to find out the  results. Do not assume everything is normal if you have not heard from your caregiver or the medical facility. It is important for you to follow up on all of your test results.  ?SEEK IMMEDIATE MEDICAL ATTENTION IF: ?You have more than a spotting of blood in your stool.  ?Your belly is swollen (abdominal distention).  ?You are nauseated or vomiting.  ?You have a temperature over 101.  ?You have abdominal pain or discomfort that is severe or gets worse throughout the day.   ? ? ?3 polyps removed in your colon today ? ?Colon polyp and diverticulosis information provided ? ?The recommendations to follow pending review of pathology report ? ?At patient request, I called [facility] at [phone] -reviewed findings and recommendations ?

## 2021-07-20 NOTE — Anesthesia Preprocedure Evaluation (Addendum)
Anesthesia Evaluation  ?Patient identified by MRN, date of birth, ID band ?Patient awake ? ? ? ?Reviewed: ?Allergy & Precautions, NPO status , Patient's Chart, lab work & pertinent test results ? ?Airway ?Mallampati: II ? ?TM Distance: >3 FB ?Neck ROM: Full ? ? ? Dental ? ?(+) Missing, Chipped, Dental Advisory Given,  ?  ?Pulmonary ?sleep apnea , former smoker,  ?  ?Pulmonary exam normal ?breath sounds clear to auscultation ? ? ? ? ? ? Cardiovascular ?Exercise Tolerance: Good ?hypertension, Pt. on medications ? ?Rhythm:Regular Rate:Normal ? ? ?  ?Neuro/Psych ? 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 GI/Hepatic ?negative GI ROS, Neg liver ROS,   ?Endo/Other  ?diabetes, Well Controlled, Type 2, Oral Hypoglycemic Agents ? Renal/GU ?negative Renal ROS  ?negative genitourinary ?  ?Musculoskeletal ?negative musculoskeletal ROS ?(+)  ? Abdominal ?  ?Peds ?negative pediatric ROS ?(+)  Hematology ?negative hematology ROS ?(+)   ?Anesthesia Other Findings ? ? Reproductive/Obstetrics ?negative OB ROS ? ?  ? ? ? ? ? ? ? ? ? ? ? ? ? ?  ?  ? ? ? ? ? ? ? ?Anesthesia Physical ?Anesthesia Plan ? ?ASA: 2 ? ?Anesthesia Plan: General  ? ?Post-op Pain Management: Minimal or no pain anticipated  ? ?Induction: Intravenous ? ?PONV Risk Score and Plan: Propofol infusion ? ?Airway Management Planned: Nasal Cannula and Natural Airway ? ?Additional Equipment:  ? ?Intra-op Plan:  ? ?Post-operative Plan:  ? ?Informed Consent: I have reviewed the patients History and Physical, chart, labs and discussed the procedure including the risks, benefits and alternatives for the proposed anesthesia with the patient or authorized representative who has indicated his/her understanding and acceptance.  ? ? ? ?Dental advisory given ? ?Plan Discussed with: CRNA and Surgeon ? ?Anesthesia Plan Comments:   ? ? ? ? ? ? ?Anesthesia  Quick Evaluation ? ?

## 2021-07-20 NOTE — Transfer of Care (Signed)
Immediate Anesthesia Transfer of Care Note ? ?Patient: [name] ? ?Procedure(s) Performed: COLONOSCOPY WITH PROPOFOL ?POLYPECTOMY ? ?Patient Location: PACU ? ?Anesthesia Type:General ? ?Level of Consciousness: awake, alert  and oriented ? ?Airway & Oxygen Therapy: Patient Spontanous Breathing ? ?Post-op Assessment: Report given to RN, Post -op Vital signs reviewed and stable, Patient moving all extremities X 4 and Patient able to stick tongue midline ? ?Post vital signs: Reviewed ? ?Last Vitals:  ?Vitals Value Taken Time  ?BP 92/67   ?Temp 97.8   ?Pulse 97   ?Resp 13   ?SpO2 96   ? ? ?Last Pain:  ?Vitals:  ? 07/20/21 0957  ?TempSrc:   ?PainSc: 0-No pain  ?   ? ?Patients Stated Pain Goal: 6 (07/20/21 0857) ? ?Complications: No notable events documented. ?

## 2021-07-20 NOTE — Anesthesia Postprocedure Evaluation (Signed)
Anesthesia Post Note ? ?Patient: [name] ? ?Procedure(s) Performed: COLONOSCOPY WITH PROPOFOL ?POLYPECTOMY ? ?Patient location during evaluation: Phase II ?Anesthesia Type: General ?Level of consciousness: awake and alert and oriented ?Pain management: pain level controlled ?Vital Signs Assessment: post-procedure vital signs reviewed and stable ?Respiratory status: spontaneous breathing, nonlabored ventilation and respiratory function stable ?Cardiovascular status: blood pressure returned to baseline and stable ?Postop Assessment: no apparent nausea or vomiting ?Anesthetic complications: no ? ? ?No notable events documented. ? ? ?Last Vitals:  ?Vitals:  ? 07/20/21 1034 07/20/21 1037  ?BP: 92/67 105/66  ?Pulse: 97 97  ?Resp: 17 18  ?Temp: 36.6 ?C   ?SpO2: 96% 96%  ?  ?Last Pain:  ?Vitals:  ? 07/20/21 1037  ?TempSrc:   ?PainSc: 0-No pain  ? ? ?  ?  ?  ?  ?  ?  ? ?Chuck Caban C Tesa Meadors ? ? ? ? ?

## 2021-07-20 NOTE — Op Note (Signed)
[facility] ?Patient Name: [name] ?Procedure Date: 07/20/2021 9:56 AM ?MRN: [id number] ?Date of Birth: 05-25-1965 ?Attending MD: Norvel Richards , MD ?CSN: [id number] ?Age: 56 ?Admit Type: Outpatient ?Procedure:                Colonoscopy ?Indications:              High risk colon cancer surveillance: Personal  ?                          history of colonic polyps ?Providers:                Norvel Richards, MD, Janeece Riggers, RN, Tammy  ?                          Vaught, RN ?Referring MD:              ?Medicines:                Propofol per Anesthesia ?Complications:            No immediate complications. ?Estimated Blood Loss:     Estimated blood loss was minimal. ?Procedure:                Pre-Anesthesia Assessment: ?                          - Prior to the procedure, a History and Physical  ?                          was performed, and patient medications and  ?                          allergies were reviewed. The patient's tolerance of  ?                          previous anesthesia was also reviewed. The risks  ?                          and benefits of the procedure and the sedation  ?                          options and risks were discussed with the patient.  ?                          All questions were answered, and informed consent  ?                          was obtained. Prior Anticoagulants: The patient has  ?                          taken no previous anticoagulant or antiplatelet  ?                          agents. ASA Grade Assessment: II - A patient with  ?  mild systemic disease. After reviewing the risks  ?                          and benefits, the patient was deemed in  ?                          satisfactory condition to undergo the procedure. ?                          After obtaining informed consent, the colonoscope  ?                          was passed under direct vision. Throughout the  ?                          procedure, the patient's blood  pressure, pulse, and  ?                          oxygen saturations were monitored continuously. The  ?                          [id number]) scope was introduced through  ?                          the anus and advanced to the the cecum, identified  ?                          by appendiceal orifice and ileocecal valve. The  ?                          colonoscopy was performed without difficulty. The  ?                          patient tolerated the procedure well. The quality  ?                          of the bowel preparation was adequate. ?Scope In: 10:13:42 AM ?Scope Out: 10:30:28 AM ?Scope Withdrawal Time: 0 hours 10 minutes 52 seconds  ?Total Procedure Duration: 0 hours 16 minutes 46 seconds  ?Findings: ?     The perianal and digital rectal examinations were normal. ?     Three semi-pedunculated polyps were found in the descending colon and  ?     cecum. The polyps were 6 to 8 mm in size. These polyps were removed with  ?     a cold and hot snare. Resection and retrieval were complete. Estimated  ?     blood loss was minimal. ?     Scattered medium-mouthed diverticula were found in the entire colon. ?     The exam was otherwise without abnormality on direct and retroflexion  ?     views. ?Impression:               - Three 6 to 8 mm polyps in the descending colon  ?  and in the cecum, removed with a cold and hot  ?                          snare. Resected and retrieved. ?                          - Diverticulosis in the entire examined colon. ?                          - The examination was otherwise normal on direct  ?                          and retroflexion views. ?Moderate Sedation: ?     Moderate (conscious) sedation was personally administered by an  ?     anesthesia professional. The following parameters were monitored: oxygen  ?     saturation, heart rate, blood pressure, respiratory rate, EKG, adequacy  ?     of pulmonary ventilation, and response to  care. ?Recommendation:           - Patient has a contact number available for  ?                          emergencies. The signs and symptoms of potential  ?                          delayed complications were discussed with the  ?                          patient. Return to normal activities tomorrow.  ?                          Written discharge instructions were provided to the  ?                          patient. ?                          - Resume previous diet. ?                          - Continue present medications. ?                          - Repeat colonoscopy date to be determined after  ?                          pending pathology results are reviewed for  ?                          surveillance. ?                          - Return to GI office after studies are complete. ?Procedure Code(s):        --- Professional --- ?  45385, Colonoscopy, flexible; with removal of  ?                          tumor(s), polyp(s), or other lesion(s) by snare  ?                          technique ?Diagnosis Code(s):        --- Professional --- ?                          Z86.010, Personal history of colonic polyps ?                          K63.5, Polyp of colon ?                          K57.30, Diverticulosis of large intestine without  ?                          perforation or abscess without bleeding ?CPT copyright 2019 [employer]. All rights reserved. ?The codes documented in this report are preliminary and upon coder review may  ?be revised to meet current compliance requirements. ?Cristopher Estimable. Lezley Bedgood, MD ?Norvel Richards, MD ?07/20/2021 10:38:05 AM ?This report has been signed electronically. ?Number of Addenda: 0 ?

## 2021-07-21 ENCOUNTER — Other Ambulatory Visit: Payer: Self-pay

## 2021-07-21 ENCOUNTER — Encounter: Payer: Self-pay | Admitting: Family Medicine

## 2021-07-21 ENCOUNTER — Other Ambulatory Visit: Payer: Self-pay | Admitting: Family Medicine

## 2021-07-21 ENCOUNTER — Encounter: Payer: Self-pay | Admitting: Internal Medicine

## 2021-07-21 LAB — SURGICAL PATHOLOGY

## 2021-07-21 MED ORDER — OZEMPIC (0.25 OR 0.5 MG/DOSE) 2 MG/1.5ML ~~LOC~~ SOPN: 0.5000 mg | PEN_INJECTOR | SUBCUTANEOUS | 2 refills | Status: DC

## 2021-07-21 NOTE — Telephone Encounter (Signed)
Per chart patient stopped Trulicity d/t nausea and being "unable to function".   Please advise, thanks!

## 2021-07-26 ENCOUNTER — Encounter (HOSPITAL_COMMUNITY): Payer: Self-pay | Admitting: Internal Medicine

## 2021-08-02 IMAGING — CT CT HEAD W/O CM
3 series · 16 of 47 positions shown, 19 images · non-contrast
Comparison: 11/11/2019

CLINICAL DATA: Follow-up subarachnoid hemorrhage

EXAM:
CT HEAD WITHOUT CONTRAST
TECHNIQUE: Contiguous axial images were obtained from the base of the skull
through the vertex without intravenous contrast.

[Series 2: head w o · axial · 0.51mm/px · z∈[+4,+139]mm · 10 of 33 slices shown, 13 images]
[im 3/33  brain]
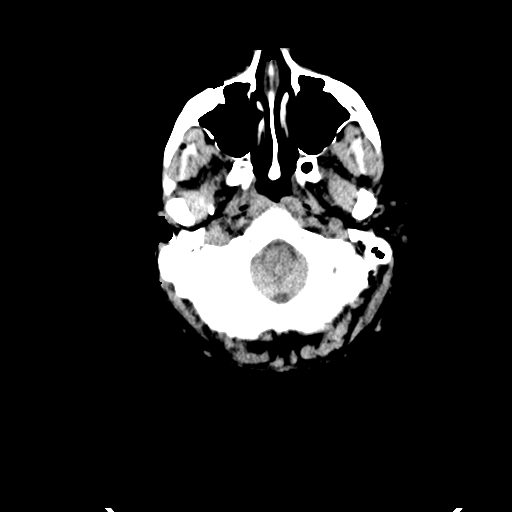
[im 3/33  bone]
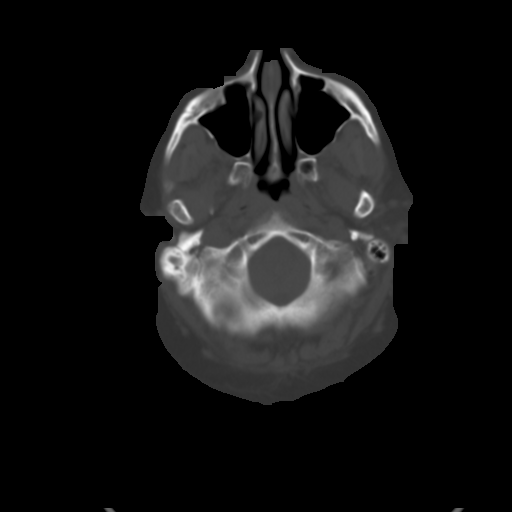
[im 6/33  brain]
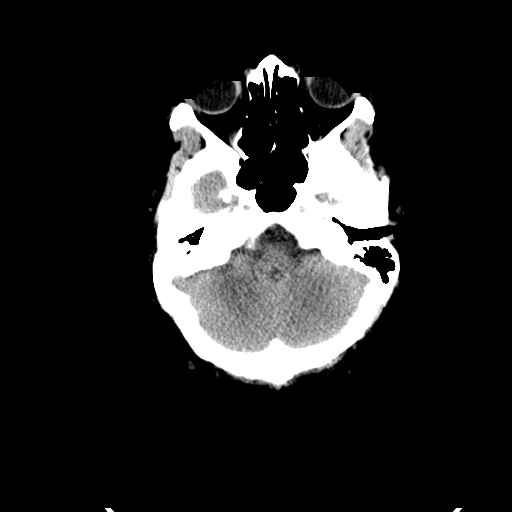
[im 9/33  brain]
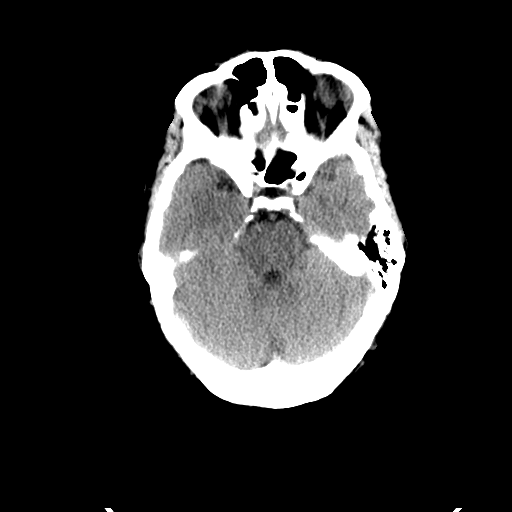
[im 12/33  brain]
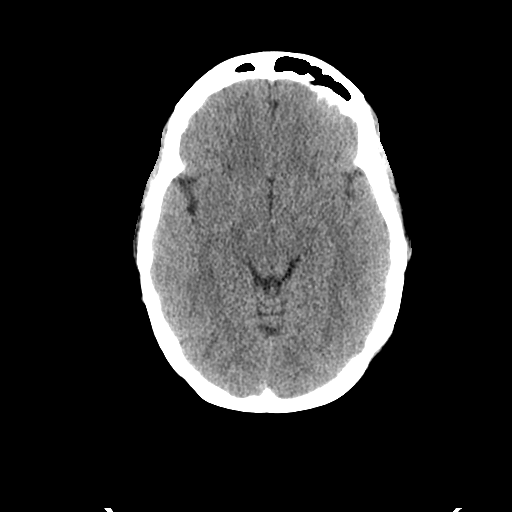
[im 15/33  brain]
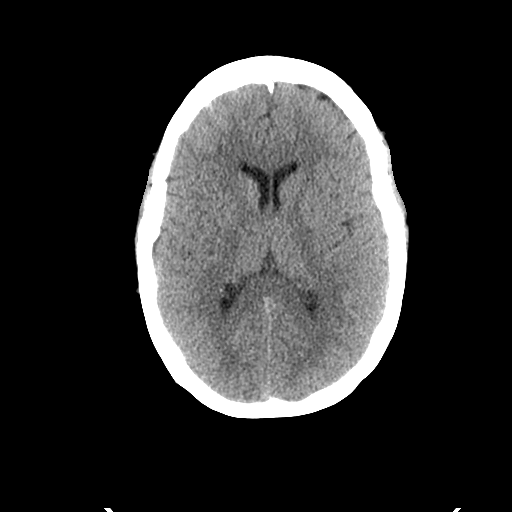
[im 15/33  bone]
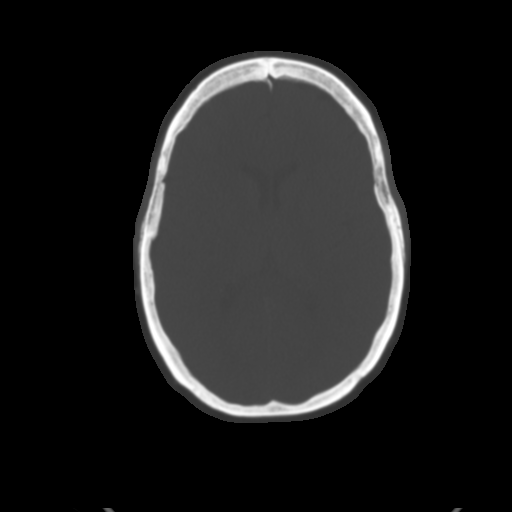
[im 18/33  brain]
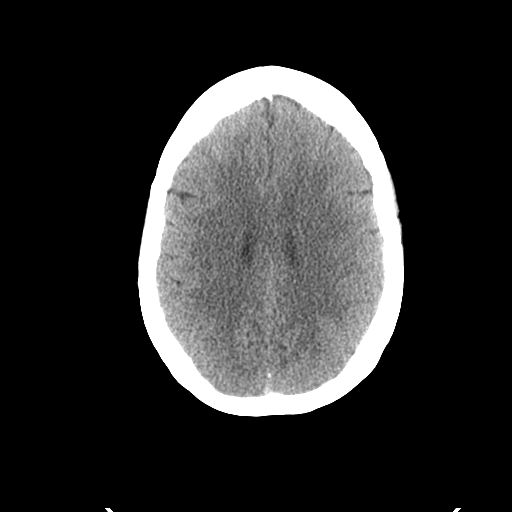
[im 21/33  brain]
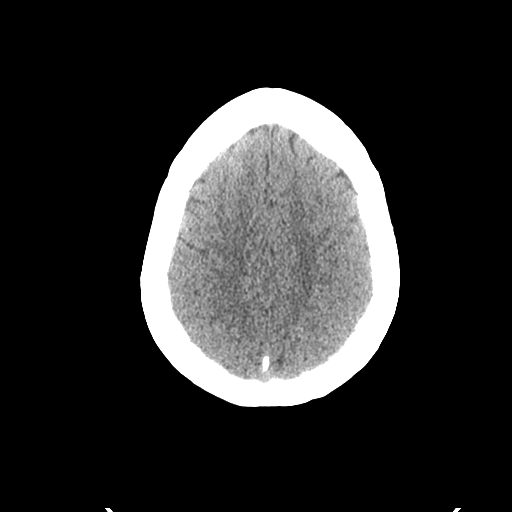
[im 25/33  brain]
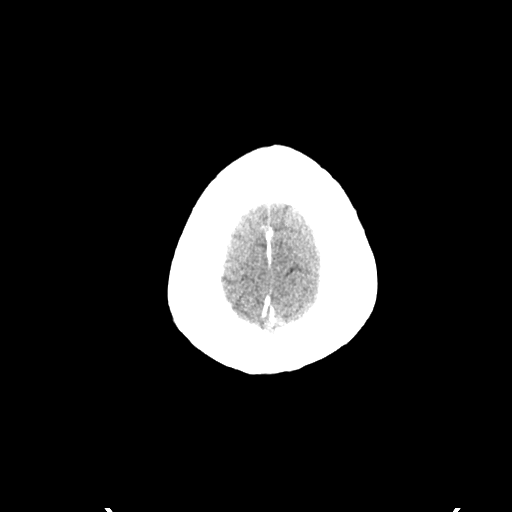
[im 27/33  brain]
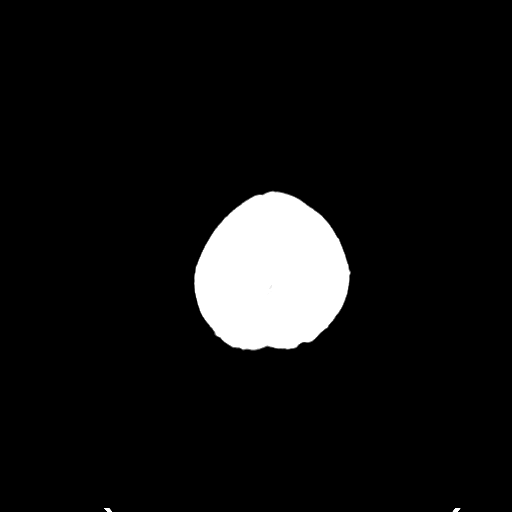
[im 27/33  bone]
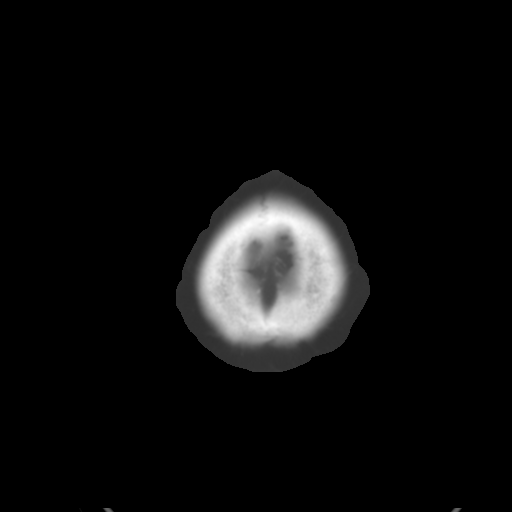
[im 30/33  brain]
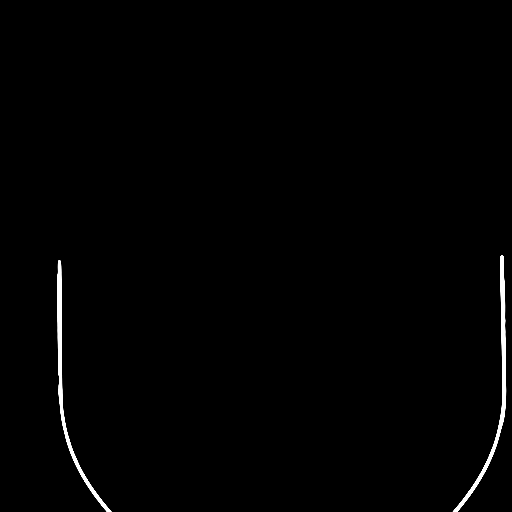

[Series 4: coronal soft · coronal · 0.33mm/px · 3 of 68 slices shown]
[im 23/68  brain]
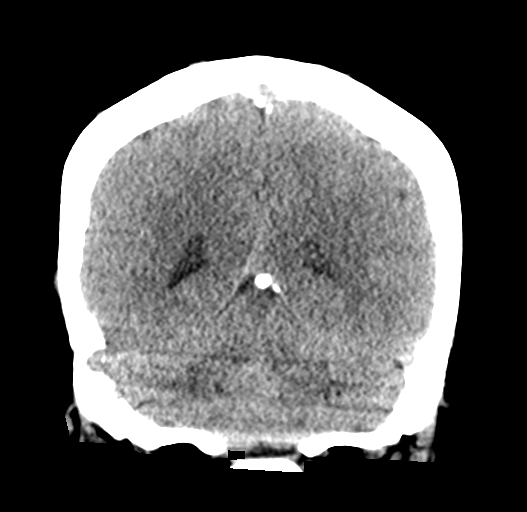
[im 30/68  brain]
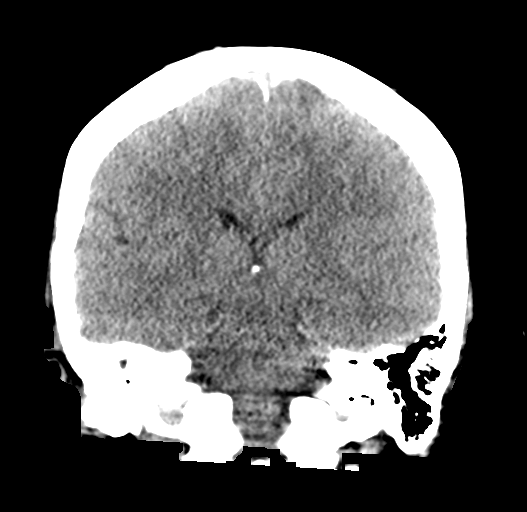
[im 38/68  brain]
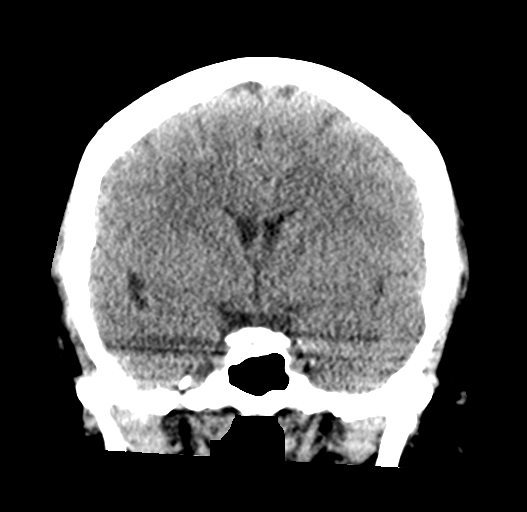

[Series 5: sagittal soft · sagittal · 0.36mm/px · 3 of 58 slices shown]
[im 20/58  brain]
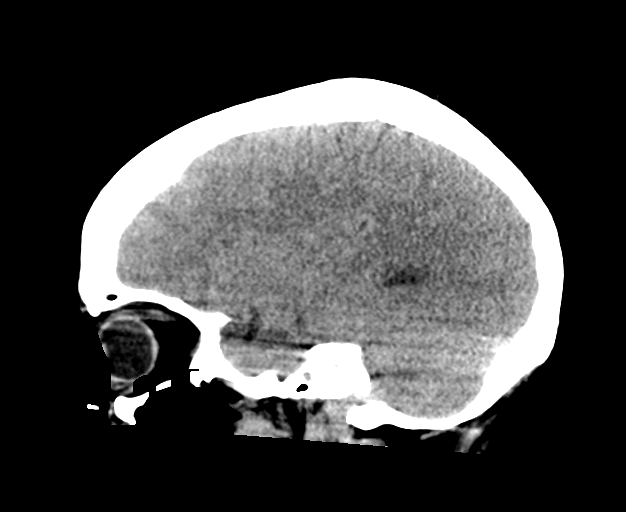
[im 29/58  brain]
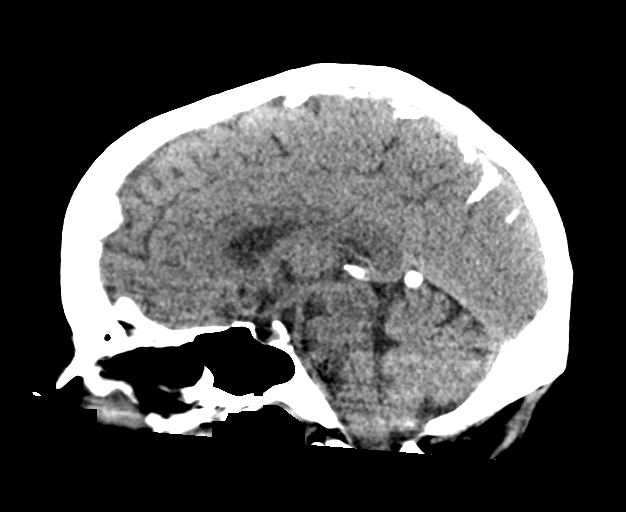
[im 39/58  brain]
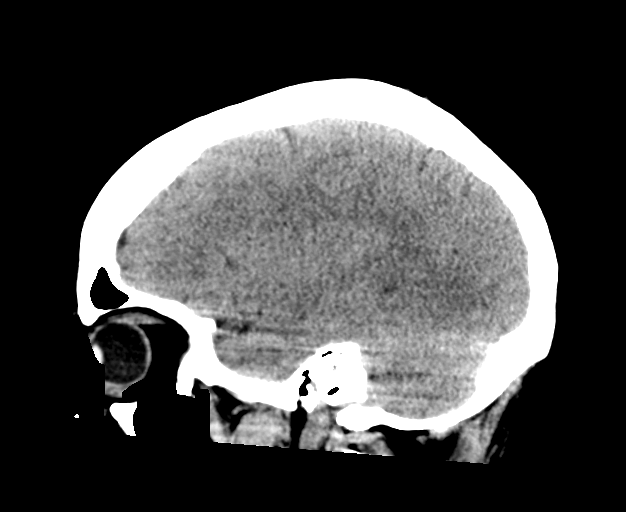

[16 of 47 positions shown; findings below may reference images not displayed]

FINDINGS: Brain: Minimal right subarachnoid hemorrhage is noted in the frontal
convexity anteriorly similar to that noted on the prior exam. No new
significant increase in subarachnoid hemorrhage is noted. No mass
lesion or findings of acute infarct are seen. 7

Vascular: No hyperdense vessel or unexpected calcification.

Skull: Normal. Negative for fracture or focal lesion.

Sinuses/Orbits: No acute finding.

Other: None.
IMPRESSION: Persistent minimal right subarachnoid hemorrhage as described stable
from the previous day. No new focal abnormality is seen.

## 2021-08-08 ENCOUNTER — Encounter: Payer: Self-pay | Admitting: *Deleted

## 2021-08-22 DIAGNOSIS — M5412 Radiculopathy, cervical region: Secondary | ICD-10-CM | POA: Diagnosis not present

## 2021-09-13 DIAGNOSIS — M5412 Radiculopathy, cervical region: Secondary | ICD-10-CM | POA: Diagnosis not present

## 2021-09-21 DIAGNOSIS — M545 Low back pain, unspecified: Secondary | ICD-10-CM | POA: Insufficient documentation

## 2021-09-21 DIAGNOSIS — M47812 Spondylosis without myelopathy or radiculopathy, cervical region: Secondary | ICD-10-CM | POA: Diagnosis not present

## 2021-10-14 ENCOUNTER — Other Ambulatory Visit: Payer: Self-pay | Admitting: Family Medicine

## 2021-10-14 NOTE — Telephone Encounter (Signed)
Requested medications are due for refill today.  yes  Requested medications are on the active medications list.  yes  Last refill. 02/14/2021 #20 0 refills  Future visit scheduled.   no  Notes to clinic.  Refill not delegated.    Requested Prescriptions  Pending Prescriptions Disp Refills   ondansetron (ZOFRAN) 4 MG tablet [Pharmacy Med Name: ONDANSETRON HCL 4 MG TABLET] 18 tablet 1    Sig: TAKE 1 TABLET BY MOUTH EVERY 8 HOURS AS NEEDED FOR NAUSEA AND VOMITING     Not Delegated - Gastroenterology: Antiemetics - ondansetron Failed - 10/14/2021 10:52 AM      Failed - This refill cannot be delegated      Passed - AST in normal range and within 360 days    AST  Date Value Ref Range Status  07/12/2021 17 10 - 35 U/L Final         Passed - ALT in normal range and within 360 days    ALT  Date Value Ref Range Status  07/12/2021 24 6 - 29 U/L Final         Passed - Valid encounter within last 6 months    Recent Outpatient Visits           3 months ago Acute pain of left shoulder   [place] [facility], Cammie Mcgee, MD   5 months ago Memory loss   [place], Warren T, MD   7 months ago Controlled type 2 diabetes mellitus with complication, without long-term current use of insulin ([place])   [place] [facility], Cammie Mcgee, MD   9 months ago Fall, initial encounter   [place] [facility], Cammie Mcgee, MD   10 months ago Acute nasopharyngitis   [place] [facility], Cammie Mcgee, MD

## 2021-10-18 ENCOUNTER — Encounter: Payer: Self-pay | Admitting: Family Medicine

## 2021-10-18 DIAGNOSIS — L718 Other rosacea: Secondary | ICD-10-CM | POA: Diagnosis not present

## 2021-10-19 ENCOUNTER — Other Ambulatory Visit: Payer: Self-pay | Admitting: Family Medicine

## 2021-10-19 NOTE — Telephone Encounter (Signed)
Requested Prescriptions  Pending Prescriptions Disp Refills  . OZEMPIC, 0.25 OR 0.5 MG/DOSE, 2 MG/3ML SOPN [Pharmacy Med Name: OZEMPIC 0.25-0.5 MG/DOSE PEN] 1.5 mL 2    Sig: INJECT 0.5 MG INTO THE SKIN ONCE A WEEK.     Endocrinology:  Diabetes - GLP-1 Receptor Agonists - semaglutide Failed - 10/19/2021  1:54 AM      Failed - HBA1C in normal range and within 180 days    Hgb A1c MFr Bld  Date Value Ref Range Status  03/08/2021 5.9 (H) <5.7 % of total Hgb Final    Comment:    For someone without known diabetes, a hemoglobin  A1c value between 5.7% and 6.4% is consistent with prediabetes and should be confirmed with a  follow-up test. . For someone with known diabetes, a value <7% indicates that their diabetes is well controlled. A1c targets should be individualized based on duration of diabetes, age, comorbid conditions, and other considerations. . This assay result is consistent with an increased risk of diabetes. . Currently, no consensus exists regarding use of hemoglobin A1c for diagnosis of diabetes for children. .          Passed - Cr in normal range and within 360 days    Creat  Date Value Ref Range Status  07/12/2021 0.83 0.50 - 1.03 mg/dL Final         Passed - Valid encounter within last 6 months    Recent Outpatient Visits          3 months ago Acute pain of left shoulder   [place] [facility], Cammie Mcgee, MD   6 months ago Memory loss   [place], Warren T, MD   7 months ago Controlled type 2 diabetes mellitus with complication, without long-term current use of insulin ([place])   [place] [facility] Cammie Mcgee, MD   9 months ago Fall, initial encounter   [place] Pick[facility], Cammie Mcgee, MD   10 months ago Acute nasopharyngitis   [place] [facility], Cammie Mcgee, MD

## 2021-11-16 ENCOUNTER — Encounter: Payer: Self-pay | Admitting: Gastroenterology

## 2021-11-16 NOTE — [facility] Notes (Signed)
Referring Provider: [name], MD Primary Care Physician:  [name], MD Primary GI Physician: Dr. Gala Romney  Chief Complaint  Patient presents with   Follow-up    HPI:   [name] is a 56 y.o. female with history of diabetes, seizures, HTN, anxiety/depression, PTSD, chronic constipation, adenomatous colon polyps, elevated LFTs, fatty liver, presenting today for follow-up.  Last seen in our office 07/04/2021.  She was not taking anything to help with constipation and her bowels are moving every 3 to 4 days.  Reported MiraLAX and stool softeners have helped in the past.  Intermittent lower abdominal pain when constipated, but no alarm symptoms.  No significant upper GI symptoms.  Regarding elevated liver enzymes/fatty liver, she reported she was trying to lose weight, but was having trouble with this.  No routine alcohol use, maybe once every few months, no history of illicit drug use.  1000 g of Tylenol a couple days a week for headaches.  No over-the-counter supplements or herbal teas.  Recommended starting MiraLAX daily, proceed with colonoscopy, update HFP, referred to healthy weight and wellness.  Labs completed 07/12/2021: Alk phos elevated at 185, AST and ALT within normal limits.  Recommended focusing on weight loss through diet and exercise, follow-up with healthy weight and wellness, and we will repeat liver enzymes at follow-up in 4 months.  Colonoscopy 07/20/2021: Three 6-8 mm polyps in the descending colon and cecum removed, diverticulosis in the entire colon, otherwise normal exam.  Pathology with tubular adenomas.  Recommended 5-year surveillance.  Today:  Constipation:  Bowels moving 2-3 times a week. Not taking MiraLAX as much now. Was working well when taking routinely. No abdominal pain, brbpr, or melena.   Fatty liver/elevated LFTs:  Working on weight loss. Down 10 lbs since April 2023. Feels weight is plateauing. Started Ozempic since I saw her last.  Thinks she started some time in May. Reports last A1C was in the 5 range.   Alcohol use: Rare   No illicit drug use, OTC supplements, herbal teas.   Advil 600 mg nightly.  Tylenol occasionally in Fioricet or Norco.  Past Medical History:  Diagnosis Date   Anxiety    Depression    Diabetes mellitus without complication ([place])    Encounter for neuropsychological testing    at [facility] 3/20-suggest possible somatization   Headache    Hypertension    Migraines    Pseudoseizure    PTSD (post-traumatic stress disorder)    Rosacea    Seizures ([place])    [facility] (Dr. 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 on eeg   Sleep apnea     Past Surgical History:  Procedure Laterality Date   ABDOMINAL HYSTERECTOMY     non cancerous, partial   APPENDECTOMY     BACK SURGERY     x2   CHOLECYSTECTOMY     COLONOSCOPY WITH PROPOFOL N/A 07/20/2021   Surgeon: Daneil Dolin, MD;  Three 6-8 mm polyps in the descending colon and cecum removed, diverticulosis in the entire colon, otherwise normal exam.  Pathology with tubular adenomas.  Recommended 5-year surveillance.   CYST EXCISION     on ovaries   lumbar     ruptured disc in lumbar taken out   POLYPECTOMY  07/20/2021   Procedure: POLYPECTOMY;  Surgeon: Daneil Dolin, MD;  [facility];  Service: Endoscopy;;   ROTATOR CUFF REPAIR Left    TONSILLECTOMY  TUBAL LIGATION      Current Outpatient Medications  Medication Sig Dispense Refill   ACCU-CHEK GUIDE test strip USE AS DIRECTED TO MONITOR FSBS 1X DAILY. DX: R73.09. 100 strip 11   amLODipine (NORVASC) 5 MG tablet TAKE 1 TABLET IN THE MORNING & 1/2 A TABLET IN THE EVENING 45 tablet 0   atorvastatin (LIPITOR) 20 MG tablet Take 1 tablet (20 mg total) by mouth daily. 90 tablet 3   Blood Glucose Monitoring Suppl (BLOOD GLUCOSE SYSTEM PAK) KIT Please dispense based on patient and insurance preference. Use as directed to  monitor FSBS 1x daily. Dx: R73.09. 1 kit 1   butalbital-acetaminophen-caffeine (FIORICET) 50-325-40 MG tablet Take 2 tablets by mouth 2 (two) times daily as needed for headache.     fluticasone (FLONASE) 50 MCG/ACT nasal spray PLACE 1 SPRAY INTO BOTH NOSTRILS 2 (TWO) TIMES DAILY 16 mL 11   furosemide (LASIX) 40 MG tablet TAKE 1 TABLET BY MOUTH DAILY. STOP HCTZ 90 tablet 1   HYDROcodone-acetaminophen (NORCO/VICODIN) 5-325 MG tablet Take 1 tablet by mouth every 4 (four) hours as needed.     Lancets MISC Please dispense based on patient and insurance preference. Use as directed to monitor FSBS 1x daily. Dx: R73.09. 50 each 0   LORazepam (ATIVAN) 0.5 MG tablet Take 1 tablet (0.5 mg total) by mouth every 8 (eight) hours as needed for anxiety. 30 tablet 0   methocarbamol (ROBAXIN) 500 MG tablet Take by mouth.     ondansetron (ZOFRAN) 4 MG tablet TAKE 1 TABLET BY MOUTH EVERY 8 HOURS AS NEEDED FOR NAUSEA AND VOMITING 18 tablet 1   OZEMPIC, 0.25 OR 0.5 MG/DOSE, 2 MG/3ML SOPN INJECT 0.5 MG INTO THE SKIN ONCE A WEEK. 1.5 mL 2   promethazine (PHENERGAN) 25 MG tablet TAKE 1 TABLET BY MOUTH EVERY 6 HOURS AS NEEDED FOR NAUSEA OR VOMITING. 30 tablet 0   venlafaxine XR (EFFEXOR-XR) 150 MG 24 hr capsule Take 2 capsules (300 mg total) by mouth daily with breakfast. 180 capsule 1   zonisamide (ZONEGRAN) 100 MG capsule Take 269m daily at bedtime 180 capsule 3   No current facility-administered medications for this visit.    Allergies as of 11/17/2021 - Review Complete 11/17/2021  Allergen Reaction Noted   Penicillins Hives 11/14/2010   Toradol [ketorolac tromethamine] Hives 03/14/2016   Hydromorphone  07/07/2021   Lamotrigine Rash 12/23/2015   Zofran [ondansetron hcl] Itching 05/31/2016    Family History  Problem Relation Age of Onset   Diabetes Father    Heart disease Father 590  Hypertension Father    Cancer Maternal Grandfather        colon cancer (752's   Diabetes Paternal Grandmother    Diabetes  Paternal Grandfather    Breast cancer Paternal Aunt    Cancer Cousin        breast   Breast cancer Cousin    Breast cancer Cousin    Breast cancer Cousin    Liver cancer Neg Hx     Social History   Socioeconomic History   Marital status: Married    Spouse name: Not on file   Number of children: 2   Years of education: Associates    Highest education level: Not on file  Occupational History   Not on file  Tobacco Use   Smoking status: Former    Packs/day: 0.50    Years: 10.00    Total pack years: 5.00    Types: Cigarettes    Quit date:  05/15/1991    Years since quitting: 30.5   Smokeless tobacco: Never  Vaping Use   Vaping Use: Never used  Substance and Sexual Activity   Alcohol use: Yes    Comment: occassionally/socially, once every few months.   Drug use: No   Sexual activity: Yes    Birth control/protection: Surgical  Other Topics Concern   Not on file  Social History Narrative   Caffeine use: tea sometimes, soda sometimes   Right handed   Married x 5 years 01/27/21   1 son and 1 daughter. 5 grandchildren.    Social Determinants of Health   Financial Resource Strain: Low Risk  (12/31/2020)   Overall Financial Resource Strain (CARDIA)    Difficulty of Paying Living Expenses: Not hard at all  Food Insecurity: No Food Insecurity (12/31/2020)   Hunger Vital Sign    Worried About Running Out of Food in the Last Year: Never true    Ran Out of Food in the Last Year: Never true  Transportation Needs: No Transportation Needs (12/31/2020)   PRAPARE - Hydrologist (Medical): No    Lack of Transportation (Non-Medical): No  Physical Activity: Insufficiently Active (12/31/2020)   Exercise Vital Sign    Days of Exercise per Week: 3 days    Minutes of Exercise per Session: 30 min  Stress: No Stress Concern Present (12/31/2020)   [place]    Feeling of Stress : Only a little   Social Connections: Socially Integrated (12/31/2020)   Social Connection and Isolation Panel [NHANES]    Frequency of Communication with Friends and Family: More than three times a week    Frequency of Social Gatherings with Friends and Family: More than three times a week    Attends Religious Services: 1 to 4 times per year    Active Member of [employer] or Organizations: Yes    Attends Archivist Meetings: 1 to 4 times per year    Marital Status: Married    Review of Systems: Gen: Denies fever, chills, cold or flulike symptoms, presyncope, syncope. CV: Denies chest pain, palpitations. Resp: Denies dyspnea, cough. GI: See HPI Heme: See HPI  Physical Exam: BP (!) 144/92 (BP Location: Right Arm, Patient Position: Sitting, Cuff Size: Normal)   Pulse 80   Temp 97.9 F (36.6 C) (Temporal)   Ht 5' 6" (1.676 m)   Wt 212 lb 3.2 oz (96.3 kg)   SpO2 96%   BMI 34.25 kg/m  General:   Alert and oriented. No distress noted. Pleasant and cooperative.  Head:  Normocephalic and atraumatic. Eyes:  Conjuctiva clear without scleral icterus. Heart:  S1, S2 present without murmurs appreciated. Lungs:  Clear to auscultation bilaterally. No wheezes, rales, or rhonchi. No distress.  Abdomen:  +BS, soft, non-tender and non-distended. No rebound or guarding. No HSM or masses noted. Msk:  Symmetrical without gross deformities. Normal posture. Extremities:  Without edema. Neurologic:  Alert and  oriented x4 Psych:  Normal mood and affect.    Assessment:  56 year old female with history of diabetes, seizures, HTN, anxiety/depression, PTSD, chronic constipation, adenomatous colon polyps with colonoscopy up to date, elevated LFTs, fatty liver, presenting today for follow-up.  Elevated LFTs/fatty liver: RUQ ultrasound August 2022 with fatty liver.  Alk phos elevated at 182 and ALT elevated at 30 in August 2022.  Most recent labs 07/12/2021 with AST and ALT within normal limits, alk phos elevated at  185.  Suspect elevated alk phos is likely secondary to fatty liver or possibly diabetes.  No significant alcohol use.  Denies illicit drug use, OTC supplements, herbal teas.  Intermittent Tylenol use, but nothing significant.  No signs or symptoms of decompensated liver disease.  No Fhx of liver disease. Started Ozempic around May 2023 and has lost about 10 pounds though she feels her weight has plateaued.  Previously referred to healthy weight and wellness, but patient has yet to see them though states she would be interested in this. Diabetes is well controlled.   We will plan to repeat HFP at this time.  If persistent alk phos elevation, we will consider additional evaluation to rule out PBC.  Constipation: Responded very well to MiraLAX, but no longer taking routinely and having bowel movements 2-3 times a week.  No alarm symptoms.  Recommended resuming MiraLAX daily.   Plan:  HFP fasting. Provided patient the number to healthy weight and wellness to call and make an appointment. Counseled on fatty liver with the importance of weight loss through diet and exercise.  Separate instructions provided.  See AVS. Continue to avoid alcohol for now. Avoid OTC supplements and herbal teas. Start MiraLAX 17 g daily. Up in 3 months or sooner if needed.   Aliene Altes, PA-C [facility] 11/17/2021

## 2021-11-17 ENCOUNTER — Encounter: Payer: Self-pay | Admitting: Family Medicine

## 2021-11-17 ENCOUNTER — Ambulatory Visit (INDEPENDENT_AMBULATORY_CARE_PROVIDER_SITE_OTHER): Payer: BC Managed Care – PPO | Admitting: Gastroenterology

## 2021-11-17 ENCOUNTER — Encounter: Payer: Self-pay | Admitting: Gastroenterology

## 2021-11-17 VITALS — BP 144/92 | HR 80 | Temp 97.9°F | Ht 66.0 in | Wt 212.2 lb

## 2021-11-17 DIAGNOSIS — K760 Fatty (change of) liver, not elsewhere classified: Secondary | ICD-10-CM

## 2021-11-17 DIAGNOSIS — R7989 Other specified abnormal findings of blood chemistry: Secondary | ICD-10-CM

## 2021-11-17 DIAGNOSIS — K5900 Constipation, unspecified: Secondary | ICD-10-CM | POA: Diagnosis not present

## 2021-11-17 NOTE — Patient Instructions (Signed)
Have blood work completed at [facility].   I would like for you to see healthy weight and wellness to continue to help you with weight loss. Their number is [phone]. You can call to make an appointment.   Instructions for fatty liver: Recommend 1-2# weight loss per week until ideal body weight through exercise & diet. Low fat/cholesterol diet.   Avoid sweets, sodas, fruit juices, sweetened beverages like tea, etc. Gradually increase exercise from 15 min daily up to 1 hr per day 5 days/week. Continue to avoid alcohol use for now.  Avoid over the counter supplements and herbal teas.   For constipation, start MiraLAX 17 g daily in 8 oz of water, coffee, juice, Gatorade, or other non-carbonated beverage.   We will see you back in 3 months or sooner if needed.   It was good to see you again today!   Aliene Altes, PA-C [facility]

## 2021-11-21 DIAGNOSIS — H5213 Myopia, bilateral: Secondary | ICD-10-CM | POA: Diagnosis not present

## 2021-11-21 DIAGNOSIS — K760 Fatty (change of) liver, not elsewhere classified: Secondary | ICD-10-CM | POA: Diagnosis not present

## 2021-11-21 DIAGNOSIS — E119 Type 2 diabetes mellitus without complications: Secondary | ICD-10-CM | POA: Diagnosis not present

## 2021-11-21 DIAGNOSIS — R7989 Other specified abnormal findings of blood chemistry: Secondary | ICD-10-CM | POA: Diagnosis not present

## 2021-11-21 DIAGNOSIS — H524 Presbyopia: Secondary | ICD-10-CM | POA: Diagnosis not present

## 2021-11-21 DIAGNOSIS — H52203 Unspecified astigmatism, bilateral: Secondary | ICD-10-CM | POA: Diagnosis not present

## 2021-11-21 DIAGNOSIS — Z7984 Long term (current) use of oral hypoglycemic drugs: Secondary | ICD-10-CM | POA: Diagnosis not present

## 2021-11-22 ENCOUNTER — Ambulatory Visit (INDEPENDENT_AMBULATORY_CARE_PROVIDER_SITE_OTHER): Payer: BC Managed Care – PPO | Admitting: Family Medicine

## 2021-11-22 VITALS — BP 126/82 | HR 79 | Temp 98.3°F | Ht 67.0 in | Wt 210.0 lb

## 2021-11-22 DIAGNOSIS — E118 Type 2 diabetes mellitus with unspecified complications: Secondary | ICD-10-CM | POA: Diagnosis not present

## 2021-11-22 LAB — HEPATIC FUNCTION PANEL
AG Ratio: 1.8 (calc) (ref 1.0–2.5)
ALT: 27 U/L (ref 6–29)
AST: 22 U/L (ref 10–35)
Albumin: 4.3 g/dL (ref 3.6–5.1)
Alkaline phosphatase (APISO): 145 U/L (ref 37–153)
Bilirubin, Direct: 0.1 mg/dL (ref 0.0–0.2)
Globulin: 2.4 g/dL (calc) (ref 1.9–3.7)
Indirect Bilirubin: 0.6 mg/dL (calc) (ref 0.2–1.2)
Total Bilirubin: 0.7 mg/dL (ref 0.2–1.2)
Total Protein: 6.7 g/dL (ref 6.1–8.1)

## 2021-11-22 MED ORDER — SEMAGLUTIDE (1 MG/DOSE) 4 MG/3ML ~~LOC~~ SOPN: 1.0000 mg | PEN_INJECTOR | SUBCUTANEOUS | 3 refills | Status: DC
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
## 2021-11-23 LAB — BASIC METABOLIC PANEL WITH GFR
BUN: 8 mg/dL (ref 7–25)
CO2: 27 mmol/L (ref 20–32)
Calcium: 9.6 mg/dL (ref 8.6–10.4)
Chloride: 106 mmol/L (ref 98–110)
Creat: 0.55 mg/dL (ref 0.50–1.03)
Glucose, Bld: 94 mg/dL (ref 65–99)
Potassium: 4.9 mmol/L (ref 3.5–5.3)
Sodium: 140 mmol/L (ref 135–146)
eGFR: 109 mL/min/{1.73_m2} (ref >=60)

## 2021-11-23 LAB — HEMOGLOBIN A1C
Hgb A1c MFr Bld: 5.4 % of total Hgb (ref <5.7)
Mean Plasma Glucose: 108 mg/dL
eAG (mmol/L): 6 mmol/L

## 2021-11-24 ENCOUNTER — Encounter: Payer: Self-pay | Admitting: Family Medicine

## 2021-11-29 ENCOUNTER — Ambulatory Visit (INDEPENDENT_AMBULATORY_CARE_PROVIDER_SITE_OTHER): Payer: Medicare Other | Admitting: Family Medicine

## 2021-11-29 ENCOUNTER — Encounter (INDEPENDENT_AMBULATORY_CARE_PROVIDER_SITE_OTHER): Payer: Self-pay | Admitting: Family Medicine

## 2021-11-29 VITALS — BP 130/86 | HR 85 | Temp 98.3°F | Ht 66.0 in | Wt 208.0 lb

## 2021-11-29 DIAGNOSIS — R5383 Other fatigue: Secondary | ICD-10-CM

## 2021-11-29 DIAGNOSIS — E669 Obesity, unspecified: Secondary | ICD-10-CM

## 2021-11-29 DIAGNOSIS — Z6833 Body mass index (BMI) 33.0-33.9, adult: Secondary | ICD-10-CM

## 2021-11-29 DIAGNOSIS — L718 Other rosacea: Secondary | ICD-10-CM | POA: Diagnosis not present

## 2021-11-30 NOTE — Progress Notes (Addendum)
Marjory Sneddon, D.O. [address] [place],  [place]  [address] Office: [phone]  /  Fax: [phone]    Initial Bariatric Medicine Consultation Visit  [name] was seen in clinic today to evaluate for obesity. She is interested in losing weight to improve overall health and reduce the risk of weight related complications.   She presents today to review program treatment options, initial physical assessment, and evaluation.     [name] endorses a significant level of fatigue in which she "has no desire to do much of anything for the past year or so."  Pt had a sleep apnea test, cardiac stress test, etc and work-ups were all reassuring.    Patient Active Problem List   Diagnosis Date Noted   Pain in joint of left shoulder 07/19/2021   Cervical radiculopathy 07/19/2021   Elevated LFTs 07/04/2021   Fatty liver 07/04/2021   Constipation 07/04/2021   History of colonic polyps 07/04/2021   Migraine without aura and without status migrainosus, not intractable 08/24/2020   History of head injury 08/24/2020   Intractable chronic post-traumatic headache 04/20/2020   History of subarachnoid hemorrhage 04/20/2020   COVID-19 virus infection 12/05/2019   New onset type 2 diabetes mellitus ([place]) 11/12/2019   Subarachnoid hemorrhage following injury ([place]) 11/11/2019   Class 1 obesity due to excess calories with body mass index (BMI) of 34.0 to 34.9 in adult 11/11/2019   Encounter for monitoring postmenopausal estrogen replacement therapy 01/27/2019   Weight loss counseling, encounter for 01/27/2019   Vaginal odor 12/02/2018   Hot flashes due to menopause 12/02/2018   No energy 12/02/2018   Decreased libido 12/02/2018   Counseling for estrogen replacement therapy 12/02/2018   History of seizure 12/02/2018   Encounter for neuropsychological testing    Lumbar stenosis with neurogenic claudication 05/23/2018   Pain in left knee 07/26/2017   Seizures ([place])    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) 08/30/2015   Suicide attempt by drug ingestion ([place]) 08/30/2015   Suicidal ideation 08/30/2015   Migraines 09/29/2014   Migraine 05/19/2013   Left-sided weakness 05/18/2013   Numbness and tingling of left arm and leg 05/18/2013   CVA (cerebral infarction) 05/18/2013   Hypertension    Anxiety      Past Surgical History:  Procedure Laterality Date   ABDOMINAL HYSTERECTOMY     non cancerous, partial   APPENDECTOMY     BACK SURGERY     x2   CHOLECYSTECTOMY     COLONOSCOPY WITH PROPOFOL N/A 07/20/2021   Surgeon: Daneil Dolin, MD;  Three 6-8 mm polyps in the descending colon and cecum removed, diverticulosis in the entire colon, otherwise normal exam.  Pathology with tubular adenomas.  Recommended 5-year surveillance.   CYST EXCISION     on ovaries   lumbar     ruptured disc in lumbar taken out   POLYPECTOMY  07/20/2021   Procedure: POLYPECTOMY;  Surgeon: Daneil Dolin, MD;  Location: [facility];  Service: Endoscopy;;   ROTATOR CUFF REPAIR Left    TONSILLECTOMY     TUBAL LIGATION        Current Outpatient Medications:    ACCU-CHEK GUIDE test strip, USE AS DIRECTED TO MONITOR FSBS 1X DAILY. DX: R73.09., Disp: 100 strip, Rfl: 11   amLODipine (NORVASC) 5 MG tablet, TAKE 1 TABLET IN THE  MORNING & 1/2 A TABLET IN THE EVENING, Disp: 45 tablet, Rfl: 0   atorvastatin (LIPITOR) 20 MG tablet, Take 1 tablet (20 mg total) by mouth daily., Disp: 90 tablet, Rfl: 3   Blood Glucose Monitoring Suppl (BLOOD GLUCOSE SYSTEM PAK) KIT, Please dispense based on patient and insurance preference. Use as directed to monitor FSBS 1x daily. Dx: R73.09., Disp: 1 kit, Rfl: 1   butalbital-acetaminophen-caffeine (FIORICET) 50-325-40 MG tablet, Take 2 tablets by mouth 2 (two) times daily as needed for headache., Disp: ,  Rfl:    fluticasone (FLONASE) 50 MCG/ACT nasal spray, PLACE 1 SPRAY INTO BOTH NOSTRILS 2 (TWO) TIMES DAILY, Disp: 16 mL, Rfl: 11   furosemide (LASIX) 40 MG tablet, TAKE 1 TABLET BY MOUTH DAILY. STOP HCTZ, Disp: 90 tablet, Rfl: 1   HYDROcodone-acetaminophen (NORCO/VICODIN) 5-325 MG tablet, Take 1 tablet by mouth every 4 (four) hours as needed., Disp: , Rfl:    Lancets MISC, Please dispense based on patient and insurance preference. Use as directed to monitor FSBS 1x daily. Dx: R73.09., Disp: 50 each, Rfl: 0   LORazepam (ATIVAN) 0.5 MG tablet, Take 1 tablet (0.5 mg total) by mouth every 8 (eight) hours as needed for anxiety., Disp: 30 tablet, Rfl: 0   methocarbamol (ROBAXIN) 500 MG tablet, Take by mouth., Disp: , Rfl:    ondansetron (ZOFRAN) 4 MG tablet, TAKE 1 TABLET BY MOUTH EVERY 8 HOURS AS NEEDED FOR NAUSEA AND VOMITING, Disp: 18 tablet, Rfl: 1   OZEMPIC, 0.25 OR 0.5 MG/DOSE, 2 MG/3ML SOPN, INJECT 0.5 MG INTO THE SKIN ONCE A WEEK., Disp: 1.5 mL, Rfl: 2   promethazine (PHENERGAN) 25 MG tablet, TAKE 1 TABLET BY MOUTH EVERY 6 HOURS AS NEEDED FOR NAUSEA OR VOMITING., Disp: 30 tablet, Rfl: 0   Semaglutide, 1 MG/DOSE, 4 MG/3ML SOPN, Inject 1 mg as directed once a week., Disp: 3 mL, Rfl: 3   venlafaxine XR (EFFEXOR-XR) 150 MG 24 hr capsule, Take 2 capsules (300 mg total) by mouth daily with breakfast., Disp: 180 capsule, Rfl: 1   zonisamide (ZONEGRAN) 100 MG capsule, Take 272m daily at bedtime, Disp: 180 capsule, Rfl: 3    Allergies  Allergen Reactions   Penicillins Hives    Has patient had a PCN reaction causing immediate rash, facial/tongue/throat swelling, SOB or lightheadedness with hypotension: Yes Has patient had a PCN reaction causing severe rash involving mucus membranes or skin necrosis: No Has patient had a PCN reaction that required hospitalization No Has patient had a PCN reaction occurring within the last 10 years: No If all of the above answers are "NO", then may proceed with  Cephalosporin use.     Toradol [Ketorolac Tromethamine] Hives   Hydromorphone     Other reaction(s): Hallucination   Lamotrigine Rash   Zofran [Ondansetron Hcl] Itching       Objective:  BP 130/86   Pulse 85   Temp 98.3 F (36.8 C) (Oral)   Ht '5\' 6"'  (1.676 m)   Wt 208 lb (94.3 kg)   SpO2 100%   BMI 33.57 kg/m   She was weighed on the bioimpedance scale:   - Body mass index is 33.57 kg/m.  - Visceral fat rating: 11   General: Well Developed, well nourished, and in no acute distress.  HEENT: Normocephalic, atraumatic Skin: Warm and dry, cap RF less 2 sec, good turgor Chest:  Normal excursion, shape, no gross abn Respiratory: speaking in full sentences, no conversational dyspnea NeuroM-Sk: Ambulates w/o assistance, moves * 4 Psych:  A and O *3, insight good, mood-full    Assessment and Plan:  1. Fatigue, unspecified type - PR RHYTHM ECG, REPORT  2. Class 1 obesity with serious comorbidity and body mass index (BMI) of 33.0 to 33.9 in adult, unspecified obesity type-    BMI: 33.57     Obesity Treatment Plan:  She was encouraged to garner support from family and friends to begin weight loss journey. Work on eliminating or reducing the presence of highly processed, calorie dense foods in the home. Complete provided nutritional and psychosocial assessment questionnaire.    [name] will follow up in the next 1-2 weeks to review the above steps and continue evaluation and treatment.   Obesity Education Performed Today:  She was weighed on the bioimpedance scale and results were discussed and documented in the synopsis.  We discussed obesity as a disease and the importance of a more detailed evaluation of all the factors contributing to the disease.  We discussed the importance of long term lifestyle changes which include nutrition, exercise and behavioral modifications as well as the importance of customizing this to her specific health and social needs.  We  discussed the benefits of reaching a healthier weight to alleviate the symptoms of existing conditions and reduce the risks of the biomechanical, metabolic and psychological effects of obesity.  We discussed the goals of this program is to improve her overall health and prevent disease; not simply achieve a specific BMI.  Frequent visits are very important to patient success. I plan to see her every 2 weeks for the first 3 months and then evaluate the visit frequency after that time. I explained obesity is a life-long chronic disease and long term treatments would be required.  Educated [name] that anti-obesity medications to help her follow the eating plan may be offered in future as medically appropriate but are not always indicated.  All medication decisions will be made together after the initial workup is done and benefits and side effects are discussed in depth.  The clinic policies were reviewed with the patient including the late, no-show and cancellation policies, as well as any applicable initial program fees.  [name] appears to currently be in the action stage of change, and states she is ready to start intensive lifestyle and behavorial modifications necessary to achieve their weight loss and health goals.   45 minutes were spent today on this visit including the above counseling, all applicable pre- and post- visit chart review, and in medical documentation  Marjory Sneddon, D.O.  [place] was signed into law in 2016 which includes the topic of electronic health records.  This provides immediate access to information in MyChart.  This includes consultation notes, operative notes, office notes, lab results and pathology reports.  If you have any questions about what you read please let us know at your next visit so we can discuss your concerns and take corrective action if need be.   We are right here with you!

## 2021-12-01 ENCOUNTER — Ambulatory Visit (INDEPENDENT_AMBULATORY_CARE_PROVIDER_SITE_OTHER): Payer: BC Managed Care – PPO | Admitting: Family Medicine

## 2021-12-01 ENCOUNTER — Encounter: Payer: Self-pay | Admitting: Family Medicine

## 2021-12-01 VITALS — BP 146/89 | HR 79 | Ht 66.0 in | Wt 212.4 lb

## 2021-12-01 DIAGNOSIS — F445 Conversion disorder with seizures or convulsions: Secondary | ICD-10-CM | POA: Diagnosis not present

## 2021-12-01 DIAGNOSIS — F419 Anxiety disorder, unspecified: Secondary | ICD-10-CM | POA: Diagnosis not present

## 2021-12-01 DIAGNOSIS — G44209 Tension-type headache, unspecified, not intractable: Secondary | ICD-10-CM | POA: Diagnosis not present

## 2021-12-01 DIAGNOSIS — G43009 Migraine without aura, not intractable, without status migrainosus: Secondary | ICD-10-CM

## 2021-12-01 MED ORDER — ZONISAMIDE 100 MG PO CAPS: ORAL_CAPSULE | ORAL | 3 refills | Status: DC

## 2021-12-01 NOTE — Patient Instructions (Signed)
Below is our plan:  We will continue zonisamide '200mg'$  daily. Continue weight loss efforts. Continue discussion with PCP regarding amitriptyline if needed. I would encourage you to find a mental health provider to help with stress management. Consider psychiatry versus psychology. Keep an eye on your blood pressure.   Please make sure you are staying well hydrated. I recommend 50-60 ounces daily. Well balanced diet and regular exercise encouraged. Consistent sleep schedule with 6-8 hours recommended.   Please continue follow up with care team as directed.   Follow up with me in 1 year   You may receive a survey regarding today's visit. I encourage you to leave honest feed back as I do use this information to improve patient care. Thank you for seeing me today!
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
## 2022-01-16 ENCOUNTER — Other Ambulatory Visit: Payer: Self-pay

## 2022-01-16 NOTE — Telephone Encounter (Signed)
  Prescription Request  01/16/2022  Is this a "Controlled Substance" medicine? No  LOV: 11/22/2021   What is the name of the medication or equipment? venlafaxine XR (EFFEXOR-XR) 150 MG 24 hr capsule [[id number]]   Have you contacted your pharmacy to request a refill? Yes   Which pharmacy would you like this sent to?  [id number] [place] [address] [place] [address]Phone: [phone]: [phone]  Patient notified that their request is being sent to the clinical staff for review and that they should receive a response within 2 business days.   Please advise at [phone](mobile)

## 2022-01-17 NOTE — Telephone Encounter (Signed)
Requested medication (s) are due for refill today - yes  Requested medication (s) are on the active medication list -yes  Future visit scheduled -yes  Last refill: 01/19/21 #180 1RF  Notes to clinic: fails lab protocol- over 1 year -02/16/20  Requested Prescriptions  Pending Prescriptions Disp Refills   venlafaxine XR (EFFEXOR-XR) 150 MG 24 hr capsule 180 capsule 1    Sig: Take 2 capsules (300 mg total) by mouth daily with breakfast.     Psychiatry: Antidepressants - SNRI - desvenlafaxine & venlafaxine Failed - 01/17/2022 10:30 AM      Failed - Last BP in normal range    BP Readings from Last 1 Encounters:  12/01/21 (!) 146/89         Failed - Valid encounter within last 6 months    Recent Outpatient Visits           6 months ago Acute pain of left shoulder   [place] [name], MD   9 months ago Memory loss   [place], Cammie Mcgee, MD   10 months ago Controlled type 2 diabetes mellitus with complication, without long-term current use of insulin ([place])   [place] Pickard, Cammie Mcgee, MD   1 year ago Fall, initial encounter   [place] Pickard, Cammie Mcgee, MD   1 year ago Acute nasopharyngitis   [place], Cammie Mcgee, MD       Future Appointments             In 2 days [name], MD [place], [facility]            Failed - Lipid Panel in normal range within the last 12 months    Cholesterol  Date Value Ref Range Status  02/16/2020 200 (H) <200 mg/dL Final   LDL Cholesterol (Calc)  Date Value Ref Range Status  02/16/2020 112 (H) mg/dL (calc) Final    Comment:    Reference range: <100 . Desirable range <100 mg/dL for primary prevention;   <70 mg/dL for patients with CHD or diabetic patients  with > or = 2 CHD risk factors. [place] Kitchen LDL-C is now calculated using the Martin-Hopkins  calculation, which is a validated novel method  providing  better accuracy than the Friedewald equation in the  estimation of LDL-C.  Cresenciano Genre et al. Annamaria Helling. 6734;193(79): 2061-2068  (http://education.[facility].com/faq/FAQ164)    HDL  Date Value Ref Range Status  02/16/2020 58 > OR = 50 mg/dL Final   Triglycerides  Date Value Ref Range Status  02/16/2020 180 (H) <150 mg/dL Final         Passed - Cr in normal range and within 360 days    Creat  Date Value Ref Range Status  11/22/2021 0.55 0.50 - 1.03 mg/dL Final         Passed - Completed PHQ-2 or PHQ-9 in the last 360 days         Requested Prescriptions  Pending Prescriptions Disp Refills   venlafaxine XR (EFFEXOR-XR) 150 MG 24 hr capsule 180 capsule 1    Sig: Take 2 capsules (300 mg total) by mouth daily with breakfast.     Psychiatry: Antidepressants - SNRI - desvenlafaxine & venlafaxine Failed - 01/17/2022 10:30 AM      Failed - Last BP in normal range    BP Readings from Last 1 Encounters:  12/01/21 (!) 146/89  Failed - Valid encounter within last 6 months    Recent Outpatient Visits           6 months ago Acute pain of left shoulder   [place] Pickard, Cammie Mcgee, MD   9 months ago Memory loss   [place], Cammie Mcgee, MD   10 months ago Controlled type 2 diabetes mellitus with complication, without long-term current use of insulin ([place])   [place] Pickard, Cammie Mcgee, MD   1 year ago Fall, initial encounter   [place] Pickard, Cammie Mcgee, MD   1 year ago Acute nasopharyngitis   [place], Cammie Mcgee, MD       Future Appointments             In 2 days [name], MD [place], [facility]            Failed - Lipid Panel in normal range within the last 12 months    Cholesterol  Date Value Ref Range Status  02/16/2020 200 (H) <200 mg/dL Final   LDL Cholesterol (Calc)  Date Value Ref Range Status   02/16/2020 112 (H) mg/dL (calc) Final    Comment:    Reference range: <100 . Desirable range <100 mg/dL for primary prevention;   <70 mg/dL for patients with CHD or diabetic patients  with > or = 2 CHD risk factors. Mar[place] Kitchen LDL-C is now calculated using the Martin-Hopkins  calculation, which is a validated novel method providing  better accuracy than the Friedewald equation in the  estimation of LDL-C.  Cresenciano Genre et al. Annamaria Helling. 5945;859(29): 2061-2068  (http://education.[facility].com/faq/FAQ164)    HDL  Date Value Ref Range Status  02/16/2020 58 > OR = 50 mg/dL Final   Triglycerides  Date Value Ref Range Status  02/16/2020 180 (H) <150 mg/dL Final         Passed - Cr in normal range and within 360 days    Creat  Date Value Ref Range Status  11/22/2021 0.55 0.50 - 1.03 mg/dL Final         Passed - Completed PHQ-2 or PHQ-9 in the last 360 days

## 2022-01-19 ENCOUNTER — Other Ambulatory Visit: Payer: Self-pay

## 2022-01-19 ENCOUNTER — Ambulatory Visit (INDEPENDENT_AMBULATORY_CARE_PROVIDER_SITE_OTHER): Payer: BC Managed Care – PPO | Admitting: Family Medicine

## 2022-01-19 ENCOUNTER — Telehealth: Payer: Self-pay

## 2022-01-19 VITALS — BP 118/76 | HR 74 | Ht 66.0 in | Wt 207.6 lb

## 2022-01-19 DIAGNOSIS — F32A Anxiety disorder, unspecified: Secondary | ICD-10-CM

## 2022-01-19 DIAGNOSIS — F419 Anxiety disorder, unspecified: Secondary | ICD-10-CM

## 2022-01-19 DIAGNOSIS — L304 Erythema intertrigo: Secondary | ICD-10-CM | POA: Diagnosis not present

## 2022-01-19 MED ORDER — BUPROPION HCL ER (XL) 150 MG PO TB24: 150.0000 mg | ORAL_TABLET | Freq: Every day | ORAL | 3 refills | Status: DC

## 2022-01-19 MED ORDER — CLOTRIMAZOLE-BETAMETHASONE 1-0.05 % EX CREA: 1.0000 | TOPICAL_CREAM | Freq: Two times a day (BID) | CUTANEOUS | 0 refills | Status: DC

## 2022-01-19 MED ORDER — VENLAFAXINE HCL ER 150 MG PO CP24: 300.0000 mg | ORAL_CAPSULE | Freq: Every day | ORAL | 1 refills | Status: DC

## 2022-01-19 NOTE — Progress Notes (Signed)
Subjective:    Patient ID: Lovie Macadamia, female    DOB: 07-16-1965, 56 y.o.   MRN: [id number] Patient has a rash in her groin.  It is weeks.  It is a salmon-colored patch with serpiginous borders that spreads onto the perineum and onto the upper thigh bilaterally.  It is consistent with intertrigo.  She also reports increasing stress and depression.  She is caring for her mother.  She has stress at home.  She has stress associated with work.  She feels sad and anxious.  She is on 300 mg a day of venlafaxine. Past Medical History:  Diagnosis Date   Anxiety    Depression    Diabetes mellitus without complication ([place])    Encounter for neuropsychological testing    at [facility] 3/20-suggest possible somatization   Headache    Hypertension    Migraines    Pseudoseizure    PTSD (post-traumatic stress disorder)    Rosacea    Seizures ([place])    [facility] (Dr. 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  Pathology with tubular adenomas.  Recommended 5-year surveillance.   CYST EXCISION     on ovaries   lumbar     ruptured disc in lumbar taken out   POLYPECTOMY  07/20/2021   Procedure: POLYPECTOMY;  Surgeon: Daneil Dolin, MD;  Location: [facility];  Service: Endoscopy;;   ROTATOR CUFF REPAIR Left    TONSILLECTOMY     TUBAL LIGATION     Current Outpatient Medications on File Prior to Visit  Medication Sig Dispense Refill   ACCU-CHEK GUIDE test strip USE AS DIRECTED TO MONITOR FSBS 1X DAILY. DX: R73.09. 100 strip 11   amLODipine (NORVASC) 5 MG tablet  TAKE 1 TABLET IN THE MORNING & 1/2 A TABLET IN THE EVENING 45 tablet 0   atorvastatin (LIPITOR) 20 MG tablet Take 1 tablet (20 mg total) by mouth daily. 90 tablet 3   Blood Glucose Monitoring Suppl (BLOOD GLUCOSE SYSTEM PAK) KIT Please dispense based on patient and insurance preference. Use as directed to monitor FSBS 1x daily. Dx: R73.09. 1 kit 1   butalbital-acetaminophen-caffeine (FIORICET) 50-325-40 MG tablet Take 2 tablets by mouth 2 (two) times daily as needed for headache.     fluticasone (FLONASE) 50 MCG/ACT nasal spray PLACE 1 SPRAY INTO BOTH NOSTRILS 2 (TWO) TIMES DAILY 16 mL 11   furosemide (LASIX) 40 MG tablet TAKE 1 TABLET BY MOUTH DAILY. STOP HCTZ 90 tablet 1   HYDROcodone-acetaminophen (NORCO/VICODIN) 5-325 MG tablet Take 1 tablet by mouth every 4 (four) hours as needed.     Lancets MISC Please dispense based on patient and insurance preference. Use as directed to monitor FSBS 1x daily. Dx: R73.09. 50 each 0   LORazepam (ATIVAN) 0.5 MG tablet Take 1 tablet (0.5 mg total) by mouth every 8 (eight) hours as needed for anxiety. 30 tablet 0   methocarbamol (ROBAXIN) 500 MG tablet Take by mouth.     ondansetron (ZOFRAN) 4 MG tablet  TAKE 1 TABLET BY MOUTH EVERY 8 HOURS AS NEEDED FOR NAUSEA AND VOMITING 18 tablet 1   OZEMPIC, 0.25 OR 0.5 MG/DOSE, 2 MG/3ML SOPN INJECT 0.5 MG INTO THE SKIN ONCE A WEEK. 1.5 mL 2   promethazine (PHENERGAN) 25 MG tablet TAKE 1 TABLET BY MOUTH EVERY 6 HOURS AS NEEDED FOR NAUSEA OR VOMITING. 30 tablet 0   Semaglutide, 1 MG/DOSE, 4 MG/3ML SOPN Inject 1 mg as directed once a week. 3 mL 3   venlafaxine XR (EFFEXOR-XR) 150 MG 24 hr capsule Take 2 capsules (300 mg total) by mouth daily with breakfast. 180 capsule 1   zonisamide (ZONEGRAN) 100 MG capsule Take 251m daily at bedtime 180 capsule 3   No current facility-administered medications on file prior to visit.   Allergies  Allergen Reactions   Penicillins Hives    Has patient had a PCN reaction causing immediate  rash, facial/tongue/throat swelling, SOB or lightheadedness with hypotension: Yes Has patient had a PCN reaction causing severe rash involving mucus membranes or skin necrosis: No Has patient had a PCN reaction that required hospitalization No Has patient had a PCN reaction occurring within the last 10 years: No If all of the above answers are "NO", then may proceed with Cephalosporin use.     Toradol [Ketorolac Tromethamine] Hives   Hydromorphone     Other reaction(s): Hallucination   Lamotrigine Rash   Zofran [Ondansetron Hcl] Itching   Social History   Socioeconomic History   Marital status: Married    Spouse name: Not on file   Number of children: 2   Years of education: Associates    Highest education level: Not on file  Occupational History   Not on file  Tobacco Use   Smoking status: Former    Packs/day: 0.50    Years: 10.00    Total pack years: 5.00    Types: Cigarettes    Quit date: 05/15/1991    Years since quitting: 30.7   Smokeless tobacco: Never  Vaping Use   Vaping Use: Never used  Substance and Sexual Activity   Alcohol use: Yes    Comment: occassionally/socially, once every few months.   Drug use: No   Sexual activity: Yes    Birth control/protection: Surgical  Other Topics Concern   Not on file  Social History Narrative   Caffeine use: tea sometimes, soda sometimes   Right handed   Married x 5 years 01/27/21   1 son and 1 daughter. 5 grandchildren.    Social Determinants of Health   Financial Resource Strain: Low Risk  (12/31/2020)   Overall Financial Resource Strain (CARDIA)    Difficulty of Paying Living Expenses: Not hard at all  Food Insecurity: No Food Insecurity (12/31/2020)   Hunger Vital Sign    Worried About Running Out of Food in the Last Year: Never true    Ran Out of Food in the Last Year: Never true  Transportation Needs: No Transportation Needs (12/31/2020)   PRAPARE - THydrologist(Medical): No     Lack of Transportation (Non-Medical): No  Physical Activity: Insufficiently Active (12/31/2020)   Exercise Vital Sign    Days of Exercise per Week: 3 days    Minutes of Exercise per Session: 30 min  Stress: No Stress Concern Present (12/31/2020)   [place]   Feeling of Stress : Only a little  Social Connections: Socially Integrated (12/31/2020)   Social Connection  and Isolation Panel [NHANES]    Frequency of Communication with Friends and Family: More than three times a week    Frequency of Social Gatherings with Friends and Family: More than three times a week    Attends Religious Services: 1 to 4 times per year    Active Member of [employer] or Organizations: Yes    Attends Archivist Meetings: 1 to 4 times per year    Marital Status: Married  Human resources officer Violence: Not At Risk (12/31/2020)   Humiliation, Afraid, Rape, and Kick questionnaire    Fear of Current or Ex-Partner: No    Emotionally Abused: No    Physically Abused: No    Sexually Abused: No      Review of Systems  All other systems reviewed and are negative.      Objective:    Physical Exam Vitals reviewed.  Constitutional:      General: She is not in acute distress.    Appearance: Normal appearance. She is normal weight. She is not ill-appearing, toxic-appearing or diaphoretic.  HENT:     Head: Normocephalic.  Cardiovascular:     Rate and Rhythm: Normal rate and regular rhythm.     Heart sounds: No murmur heard.   Pulmonary:     Effort: Pulmonary effort is normal. No respiratory distress.     Breath sounds: Normal breath sounds.   Neurological:     General: No focal deficit present.     Mental Status: She is alert and oriented to person, place, and time. Mental status is at baseline.     Cranial Nerves: No cranial nerve deficit.     Motor: No weakness.     Coordination: Coordination normal.     Gait: Gait normal.   Psychiatric:        Mood and Affect: Mood normal.        Behavior: Behavior normal.        Thought Content: Thought content normal.        Judgment: Judgment normal.   Intertrigo like rash on the upper medial thighs bilaterally     Assessment & Plan:  Intertrigo  Anxiety and depression We will treat intertrigo with lifestyle changes and also Lotrisone cream twice daily for 10 to 14 days.  We discussed options this.  She has a history of a rash to Lamictal.  I do not feel that the stress rises to the level to require Rexulti.  She does not want to completely abandon venlafaxin.  Therefore we decided to add Wellbutrin XL grams daily to the Effexor.  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  Monitor for any abnormal reaction to the medication.  Also recommended that she see a counselor and gave her a recommendation of an office to call

## 2022-01-19 NOTE — Telephone Encounter (Signed)
  Prescription Request  01/19/2022  Is this a "Controlled Substance" medicine? No  LOV: 01/19/2022   What is the name of the medication or equipment? venlafaxine XR (EFFEXOR-XR) 150 MG 24 hr capsule [[id number]]   Have you contacted your pharmacy to request a refill? Yes   Which pharmacy would you like this sent to?  [id number] [place] [address] [address]Phone: [phone]: [phone]  Patient notified that their request is being sent to the clinical staff for review and that they should receive a response within 2 business days.   Please advise at [phone]

## 2022-02-01 DIAGNOSIS — F331 Major depressive disorder, recurrent, moderate: Secondary | ICD-10-CM | POA: Diagnosis not present

## 2022-02-09 ENCOUNTER — Ambulatory Visit: Payer: BC Managed Care – PPO | Admitting: Physician Assistant

## 2022-02-09 DIAGNOSIS — F331 Major depressive disorder, recurrent, moderate: Secondary | ICD-10-CM | POA: Diagnosis not present

## 2022-02-10 ENCOUNTER — Telehealth: Payer: Self-pay | Admitting: Family Medicine

## 2022-02-10 ENCOUNTER — Ambulatory Visit (INDEPENDENT_AMBULATORY_CARE_PROVIDER_SITE_OTHER): Payer: BC Managed Care – PPO

## 2022-02-10 ENCOUNTER — Other Ambulatory Visit: Payer: Self-pay | Admitting: Family Medicine

## 2022-02-10 VITALS — BP 130/100 | HR 79 | Temp 98.0°F | Ht 66.0 in | Wt 209.0 lb

## 2022-02-10 DIAGNOSIS — Z1231 Encounter for screening mammogram for malignant neoplasm of breast: Secondary | ICD-10-CM

## 2022-02-10 DIAGNOSIS — R928 Other abnormal and inconclusive findings on diagnostic imaging of breast: Secondary | ICD-10-CM

## 2022-02-10 DIAGNOSIS — Z0000 Encounter for general adult medical examination without abnormal findings: Secondary | ICD-10-CM

## 2022-02-10 NOTE — Progress Notes (Signed)
Subjective:   [name] is a 56 y.o. female who presents for Medicare Annual (Subsequent) preventive examination.  Review of Systems     Cardiac Risk Factors include: diabetes mellitus;obesity (BMI >30kg/m2);hypertension     Objective:    Today's Vitals   02/10/22 0913 02/10/22 0914  BP: (!) 124/98 (!) 130/100  Pulse: 79   Temp: 98 F (36.7 C)   TempSrc: Oral   SpO2: 97%   Weight: 209 lb (94.8 kg)   Height: _0  (1.676 m)   PainSc:  5    Body mass index is 33.73 kg/m.     07/20/2021    8:47 AM 12/31/2020    8:32 AM 04/01/2020    1:45 PM 11/15/2019    8:07 PM 11/11/2019    3:43 PM 11/11/2019    9:40 AM 01/17/2019    7:39 PM  Advanced Directives  Does Patient Have a Medical Advance Directive? _1  No No  Would patient like information on creating a medical advance directive? No - Patient declined No - Patient declined  No - Patient declined No - Patient declined      Current Medications (verified) Outpatient Encounter Medications as of 02/10/2022  Medication Sig   ACCU-CHEK GUIDE test strip USE AS DIRECTED TO MONITOR FSBS 1X DAILY. DX: R73.09.   amLODipine (NORVASC) 5 MG tablet TAKE 1 TABLET IN THE MORNING & 1/2 A TABLET IN THE EVENING   atorvastatin (LIPITOR) 20 MG tablet Take 1 tablet (20 mg total) by mouth daily.   Blood Glucose Monitoring Suppl (BLOOD GLUCOSE SYSTEM PAK) KIT Please dispense based on patient and insurance preference. Use as directed to monitor FSBS 1x daily. Dx: R73.09.   buPROPion (WELLBUTRIN XL) 150 MG 24 hr tablet Take 1 tablet (150 mg total) by mouth daily.   butalbital-acetaminophen-caffeine (FIORICET) 50-325-40 MG tablet Take 2 tablets by mouth 2 (two) times daily as needed for headache.   clotrimazole-betamethasone (LOTRISONE) cream Apply 1 Application topically 2 (two) times daily.   fluticasone (FLONASE) 50 MCG/ACT nasal spray PLACE 1 SPRAY INTO BOTH NOSTRILS 2 (TWO) TIMES DAILY   furosemide (LASIX) 40 MG tablet TAKE 1 TABLET BY  MOUTH DAILY. STOP HCTZ   HYDROcodone-acetaminophen (NORCO/VICODIN) 5-325 MG tablet Take 1 tablet by mouth every 4 (four) hours as needed.   Lancets MISC Please dispense based on patient and insurance preference. Use as directed to monitor FSBS 1x daily. Dx: R73.09.   LORazepam (ATIVAN) 0.5 MG tablet Take 1 tablet (0.5 mg total) by mouth every 8 (eight) hours as needed for anxiety.   methocarbamol (ROBAXIN) 500 MG tablet Take by mouth.   ondansetron (ZOFRAN) 4 MG tablet TAKE 1 TABLET BY MOUTH EVERY 8 HOURS AS NEEDED FOR NAUSEA AND VOMITING   OZEMPIC, 0.25 OR 0.5 MG/DOSE, 2 MG/3ML SOPN INJECT 0.5 MG INTO THE SKIN ONCE A WEEK.   promethazine (PHENERGAN) 25 MG tablet TAKE 1 TABLET BY MOUTH EVERY 6 HOURS AS NEEDED FOR NAUSEA OR VOMITING.   Semaglutide, 1 MG/DOSE, 4 MG/3ML SOPN Inject 1 mg as directed once a week.   venlafaxine XR (EFFEXOR-XR) 150 MG 24 hr capsule Take 2 capsules (300 mg total) by mouth daily with breakfast.   zonisamide (ZONEGRAN) 100 MG capsule Take 229m daily at bedtime   No facility-administered encounter medications on file as of 02/10/2022.    Allergies (verified) Penicillins, Toradol [ketorolac tromethamine], Hydromorphone, Lamotrigine, and Zofran [ondansetron hcl]   History: Past Medical History:  Diagnosis Date  Anxiety    Depression    Diabetes mellitus without complication ([place])    Encounter for neuropsychological testing    at [facility] 3/20-suggest possible somatization   Headache    Hypertension    Migraines    Pseudoseizure    PTSD (post-traumatic stress disorder)    Rosacea    Seizures ([place])    [facility] (Dr. 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  Pathology with tubular adenomas.  Recommended 5-year surveillance.   CYST EXCISION     on ovaries   lumbar     ruptured disc in lumbar taken out   POLYPECTOMY  07/20/2021   Procedure: POLYPECTOMY;  Surgeon: Daneil Dolin, MD;  Location: [facility];  Service: Endoscopy;;   ROTATOR CUFF REPAIR Left    TONSILLECTOMY     TUBAL LIGATION     Family History  Problem Relation Age of Onset   Diabetes Father    Heart disease Father 20   Hypertension Father    Cancer Maternal Grandfather        colon cancer (70's)   Diabetes Paternal Grandmother    Diabetes Paternal Grandfather    Breast cancer Paternal Aunt    Cancer Cousin        breast   Breast cancer Cousin    Breast cancer Cousin    Breast cancer Cousin    Liver cancer Neg Hx    Social History   Socioeconomic History   Marital status: Married    Spouse name: Not on file   Number of children: 2   Years of education: Associates    Highest education level: Not on file  Occupational History   Not on file  Tobacco Use   Smoking status: Former    Packs/day: 0.50    Years: 10.00    Total pack years: 5.00    Types: Cigarettes    Quit date: 05/15/1991    Years since quitting: 30.7   Smokeless tobacco: Never  Vaping Use   Vaping Use: Never used  Substance and Sexual Activity   Alcohol use: Yes    Comment: occassionally/socially, once every few months.   Drug use: No   Sexual activity: Yes    Birth control/protection: Surgical  Other Topics Concern   Not on file  Social History Narrative   Caffeine use: tea sometimes, soda sometimes   Right handed   Married x 5 years 01/27/21   1 son and 1 daughter. 5 grandchildren.    Social Determinants of Health   Financial Resource Strain: Low Risk  (02/10/2022)   Overall Financial Resource Strain (CARDIA)    Difficulty of Paying Living  Expenses: Not hard at all  Food Insecurity: No Food Insecurity (02/10/2022)   Hunger Vital Sign    Worried About Running Out of Food in the Last Year: Never true    Ran Out of Food in the Last Year: Never true  Transportation Needs: No Transportation Needs (02/10/2022)   PRAPARE - Transportation  Lack of Transportation (Medical): No    Lack of Transportation (Non-Medical): No  Physical Activity: Insufficiently Active (02/10/2022)   Exercise Vital Sign    Days of Exercise per Week: 2 days    Minutes of Exercise per Session: 30 min  Stress: Stress Concern Present (02/10/2022)   [place]    Feeling of Stress : Very much  Social Connections: Moderately Integrated (02/10/2022)   Social Connection and Isolation Panel [NHANES]    Frequency of Communication with Friends and Family: More than three times a week    Frequency of Social Gatherings with Friends and Family: More than three times a week    Attends Religious Services: 1 to 4 times per year    Active Member of [employer] or Organizations: No    Attends Music therapist: Never    Marital Status: Married    Tobacco Counseling Counseling given: Yes   Clinical Intake:  Pre-visit preparation completed: Yes  Pain : 0-10 (5-r-hip pain) Pain Score: 5  Pain Type: Acute pain Pain Location: Hip Pain Orientation: Right Pain Descriptors / Indicators: Radiating, Aching Pain Onset: Other (comment) (about 2 weeks ago) Pain Frequency: Intermittent Pain Relieving Factors: OTC ibuprofen Effect of Pain on Daily Activities: n/a  Pain Relieving Factors: OTC ibuprofen  BMI - recorded: 33.7 Nutritional Status: BMI > 30  Obese Diabetes: Yes CBG done?: Yes CBG resulted in Enter/ Edit results?: Yes Did pt. bring in CBG monitor from home?: No  How often do you need to have someone help you when you read instructions, pamphlets, or other written materials from your  doctor or pharmacy?: 1 - Never What is the last grade level you completed in school?: associate Degree  Diabetic?yes  Interpreter Needed?: No      Activities of Daily Living    02/10/2022    9:24 AM  In your present state of health, do you have any difficulty performing the following activities:  Hearing? 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 0  Difficulty concentrating or making decisions? 1  Walking or climbing stairs? 1  Comment somewhat due to hip pain  Dressing or bathing? 0  Doing errands, shopping? 0  Preparing Food and eating ? N  Using the Toilet? N  In the past six months, have you accidently leaked urine? N  Do you have problems with loss of bowel control? N  Managing your Medications? N  Managing your Finances? N  Housekeeping or managing your Housekeeping? N    Patient Care Team: [name], MD as PCP - General (Family Medicine) Gala Romney Cristopher Estimable, MD as Consulting Physician (Gastroenterology)  Indicate any recent Medical Services you may have received from other than [facility] providers in the past year (date may be approximate).     Assessment:   This is a routine wellness examination for TRUE.  Hearing/Vision screen No results found.  Dietary issues and exercise activities discussed: Exercise limited by: orthopedic condition(s)   Goals Addressed             This Visit's Progress    Coping Skills Enhanced       Evidence-based guidance:  Acknowledge, normalize and validate difficulty of making life-long lifestyle changes.  Identify current effective and ineffective coping strategies.  Assist with developing and using positive coping strategies.  Encourage child and parent participation in care to increase self-esteem, confidence and feelings of control.  Encourage a lifestyle that includes active  play, healthy diet, relaxation, leisure activities and social-support-building activities.  Consider alternative and complementary  therapy approaches such as meditation, mindfulness or yoga.  Provide anticipatory guidance regarding the benefits of secure parent-child attachment.  Promote strategies such as consistent response to and acceptance of emotional needs, offering expressions of love, caring and support.  Discuss spirituality; be present as concerns are identified; encourage journaling, prayer, worship services, meditation or pastoral counseling.  Encourage participation in cognitive behavioral therapy to foster a positive identity, increase self-awareness and bolster self-esteem, confidence and self-efficacy.   Notes:        Depression Screen    02/10/2022    8:54 AM 01/19/2022    9:51 AM 11/22/2021    2:08 PM 12/31/2020    8:24 AM 10/28/2020    3:36 PM 11/17/2019   12:01 PM 12/02/2018   10:12 AM  PHQ 2/9 Scores  PHQ - 2 Score 0 _0 0 0 3  PHQ- 9 Score 0 12   0  11    Fall Risk    02/10/2022    8:53 AM 01/19/2022    9:51 AM 12/31/2020    8:33 AM 10/28/2020    3:36 PM 04/05/2020    8:16 AM  Fall Risk   Falls in the past year? 0 0 0 0 0  Number falls in past yr:  0 0 0 0  Injury with Fall?  0 0 0 0  Risk for fall due to :  No Fall Risks Impaired balance/gait No Fall Risks   Follow up  Falls prevention discussed Falls prevention discussed Falls evaluation completed     FALL RISK PREVENTION PERTAINING TO THE HOME:  Any stairs in or around the home? No  If so, are there any without handrails? No  Home free of loose throw rugs in walkways, pet beds, electrical cords, etc? Yes  Adequate lighting in your home to reduce risk of falls? Yes   ASSISTIVE DEVICES UTILIZED TO PREVENT FALLS:  Life alert? No  Use of a cane, walker or w/c? No  Grab bars in the bathroom? Yes  Shower chair or bench in shower? Yes  Elevated toilet seat or a handicapped toilet? No   TIMED UP AND GO:  Was the test performed? Yes .  Length of time to ambulate 10 feet: 2 sec.   Gait steady and fast without use of assistive  device  Cognitive Function:        02/10/2022    9:27 AM 02/10/2022    8:58 AM 12/31/2020    8:38 AM  6CIT Screen  What Year? 0 points 0 points 0 points  What month? 0 points 0 points 0 points  What time? 0 points 0 points 0 points  Count back from 20 0 points 0 points 0 points  Months in reverse 0 points 0 points 0 points  Repeat phrase 0 points 0 points 0 points  Total Score 0 points 0 points 0 points    Immunizations Immunization History  Administered Date(s) Administered   Hepatitis B, PED/ADOLESCENT 07/16/2014   Moderna Sars-Covid-2 Vaccination 06/13/2019, 07/16/2019   PPD Test 06/16/2014, 07/16/2014    TDAP status: Due, Education has been provided regarding the importance of this vaccine. Advised may receive this vaccine at local pharmacy or Health Dept. Aware to provide a copy of the vaccination record if obtained from local pharmacy or Health Dept. Verbalized acceptance and understanding.  Flu Vaccine status: Due, Education has been provided regarding the importance of  this vaccine. Advised may receive this vaccine at local pharmacy or Health Dept. Aware to provide a copy of the vaccination record if obtained from local pharmacy or Health Dept. Verbalized acceptance and understanding.  Pneumococcal vaccine status: Due, Education has been provided regarding the importance of this vaccine. Advised may receive this vaccine at local pharmacy or Health Dept. Aware to provide a copy of the vaccination record if obtained from local pharmacy or Health Dept. Verbalized acceptance and understanding.  Covid-19 vaccine status: Information provided on how to obtain vaccines.   Qualifies for Shingles Vaccine? No   Zostavax completed No   Shingrix Completed?: No.    Education has been provided regarding the importance of this vaccine. Patient has been advised to call insurance company to determine out of pocket expense if they have not yet received this vaccine. Advised may also receive  vaccine at local pharmacy or Health Dept. Verbalized acceptance and understanding.  Screening Tests Health Maintenance  Topic Date Due   Diabetic kidney evaluation - Urine ACR  02/11/2022 (Originally 11/10/1983)   FOOT EXAM  02/11/2022 (Originally 10/28/2021)   COVID-19 Vaccine (3 - 2023-24 season) 02/26/2022 (Originally 11/04/2021)   Zoster Vaccines- Shingrix (1 of 2) 05/12/2022 (Originally 11/10/2017)   INFLUENZA VACCINE  06/04/2022 (Originally 10/04/2021)   HEMOGLOBIN A1C  05/23/2022   OPHTHALMOLOGY EXAM  11/22/2022   Diabetic kidney evaluation - GFR measurement  11/23/2022   MAMMOGRAM  01/04/2023   Medicare Annual Wellness (AWV)  02/11/2023   COLONOSCOPY (Pts 45-71yr Insurance coverage will need to be confirmed)  07/21/2031   Hepatitis C Screening  Completed   HIV Screening  Completed   HPV VACCINES  Aged Out   DTaP/Tdap/Td  Discontinued   PAP SMEAR-Modifier  Discontinued    Health Maintenance  There are no preventive care reminders to display for this patient.   Colorectal cancer screening: Type of screening: Colonoscopy. Completed 5/23. Repeat every 10 years  Mammogram status: Completed 10/22. Repeat every year-275yr Bone Density status: Ordered advice pt to talk to pt, not yet needed until 67. Pt provided with contact info and advised to call to schedule appt.  Lung Cancer Screening: (Low Dose CT Chest recommended if Age 54-80ears, 30 pack-year currently smoking OR have quit w/in 15years.) does not qualify.   Lung Cancer Screening Referral: n/a  Additional Screening:  Hepatitis C Screening: does not qualify; Completed n/a  Vision Screening: Recommended annual ophthalmology exams for early detection of glaucoma and other disorders of the eye. Is the patient up to date with their annual eye exam?  Yes  Who is the provider or what is the name of the office in which the patient attends annual eye exams? Dr. ShGershon Cranef pt is not established with a provider, would they like to  be referred to a provider to establish care? No .   Dental Screening: Recommended annual dental exams for proper oral hygiene  Community Resource Referral / Chronic Care Management: CRR required this visit?  No   CCM required this visit?  No      Plan:     I have personally reviewed and noted the following in the patient's chart:   Medical and social history Use of alcohol, tobacco or illicit drugs  Current medications and supplements including opioid prescriptions. Patient is currently taking opioid prescriptions. Information provided to patient regarding non-opioid alternatives. Patient advised to discuss non-opioid treatment plan with their provider. Functional ability and status Nutritional status Physical activity Advanced directives List of  other physicians Hospitalizations, surgeries, and ER visits in previous 12 months Vitals Screenings to include cognitive, depression, and falls Referrals and appointments  In addition, I have reviewed and discussed with patient certain preventive protocols, quality metrics, and best practice recommendations. A written personalized care plan for preventive services as well as general preventive health recommendations were provided to patient.     Colman Cater, [facility]   02/10/2022   Nurse Notes: pt came in today for AWV, c/o of R-hip pain due to movements, ie standing/or standing a certain way. Pt also c/o of bp has been running high, especially the Diastolic number. Advice pt to contact the office to make an OV appt w/pcp and I will give the msg to pcp

## 2022-02-10 NOTE — Telephone Encounter (Signed)
Mobile mammo scheduled for 03/28/22 at 11:50am. Left message on patient's voicemail to give date and time. Requested call back.

## 2022-02-15 ENCOUNTER — Ambulatory Visit: Payer: BC Managed Care – PPO | Admitting: Gastroenterology

## 2022-02-16 ENCOUNTER — Ambulatory Visit (INDEPENDENT_AMBULATORY_CARE_PROVIDER_SITE_OTHER): Payer: BC Managed Care – PPO

## 2022-02-16 VITALS — BP 150/92 | HR 73 | Ht 66.0 in | Wt 209.0 lb

## 2022-02-16 DIAGNOSIS — I10 Essential (primary) hypertension: Secondary | ICD-10-CM

## 2022-02-17 ENCOUNTER — Ambulatory Visit (INDEPENDENT_AMBULATORY_CARE_PROVIDER_SITE_OTHER): Payer: BC Managed Care – PPO | Admitting: Family Medicine

## 2022-02-17 ENCOUNTER — Other Ambulatory Visit (INDEPENDENT_AMBULATORY_CARE_PROVIDER_SITE_OTHER): Payer: BC Managed Care – PPO | Admitting: Family Medicine

## 2022-02-17 VITALS — BP 142/109 | HR 93 | Ht 65.0 in | Wt 210.0 lb

## 2022-02-17 DIAGNOSIS — M7061 Trochanteric bursitis, right hip: Secondary | ICD-10-CM

## 2022-02-17 DIAGNOSIS — I10 Essential (primary) hypertension: Secondary | ICD-10-CM

## 2022-02-17 MED ORDER — HYDROCODONE-ACETAMINOPHEN 5-325 MG PO TABS: 1.0000 | ORAL_TABLET | ORAL | 0 refills | Status: DC | PRN

## 2022-02-17 MED ORDER — HYDROCHLOROTHIAZIDE 25 MG PO TABS: 25.0000 mg | ORAL_TABLET | Freq: Every day | ORAL | 3 refills | Status: DC

## 2022-02-17 NOTE — Progress Notes (Signed)
See note

## 2022-02-17 NOTE — Progress Notes (Signed)
Subjective:    Patient ID: [name], female    DOB: 19-Jan-1966, 56 y.o.   MRN: [id number]   Patient presents today complaining of right hip pain.  The pain is located over the greater trochanteric bursa.  She has been taking ibuprofen and Aleve at night as well as Tylenol.  It aches and throbs at night.  She states that she cannot sleep on her side.  It hurts when she tries to massage creams into her hip.  She also has had elevated blood pressure recently with systolic blood pressures into the 160 range.  She denies any chest pain.  She thinks the pain may be raising her blood pressure and she is also under more stress. Past Medical History:  Diagnosis Date   Anxiety    Depression    Diabetes mellitus without complication ([place])    Encounter for neuropsychological testing    at [facility] 3/20-suggest possible somatization   Headache    Hypertension    Migraines    Pseudoseizure    PTSD (post-traumatic stress disorder)    Rosacea    Seizures ([place])    [facility] (Dr. 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  Pathology with tubular adenomas.  Recommended 5-year surveillance.   CYST EXCISION     on ovaries   lumbar     ruptured disc in lumbar taken out   POLYPECTOMY  07/20/2021   Procedure: POLYPECTOMY;  Surgeon: Daneil Dolin, MD;  [facility];  Service: Endoscopy;;   ROTATOR CUFF REPAIR Left    TONSILLECTOMY     TUBAL LIGATION     Current Outpatient Medications on File Prior to Visit  Medication Sig Dispense Refill   ACCU-CHEK  GUIDE test strip USE AS DIRECTED TO MONITOR FSBS 1X DAILY. DX: R73.09. 100 strip 11   amLODipine (NORVASC) 5 MG tablet TAKE 1 TABLET IN THE MORNING & 1/2 A TABLET IN THE EVENING 45 tablet 0   atorvastatin (LIPITOR) 20 MG tablet Take 1 tablet (20 mg total) by mouth daily. 90 tablet 3   Blood Glucose Monitoring Suppl (BLOOD GLUCOSE SYSTEM PAK) KIT Please dispense based on patient and insurance preference. Use as directed to monitor FSBS 1x daily. Dx: R73.09. 1 kit 1   buPROPion (WELLBUTRIN XL) 150 MG 24 hr tablet Take 1 tablet (150 mg total) by mouth daily. 30 tablet 3   butalbital-acetaminophen-caffeine (FIORICET) 50-325-40 MG tablet Take 2 tablets by mouth 2 (two) times daily as needed for headache.     clotrimazole-betamethasone (LOTRISONE) cream Apply 1 Application topically 2 (two) times daily. 30 g 0   fluticasone (FLONASE) 50 MCG/ACT nasal spray PLACE 1 SPRAY INTO BOTH NOSTRILS 2 (TWO) TIMES DAILY 16 mL 11   hydrochlorothiazide (HYDRODIURIL) 25 MG tablet Take 1 tablet (25 mg total) by mouth daily. 90 tablet 3   Lancets MISC Please dispense based on patient and insurance preference. Use as directed to monitor FSBS 1x daily. Dx: R73.09. 50 each 0   LORazepam (  ATIVAN) 0.5 MG tablet Take 1 tablet (0.5 mg total) by mouth every 8 (eight) hours as needed for anxiety. 30 tablet 0   methocarbamol (ROBAXIN) 500 MG tablet Take by mouth.     ondansetron (ZOFRAN) 4 MG tablet TAKE 1 TABLET BY MOUTH EVERY 8 HOURS AS NEEDED FOR NAUSEA AND VOMITING 18 tablet 1   OZEMPIC, 0.25 OR 0.5 MG/DOSE, 2 MG/3ML SOPN INJECT 0.5 MG INTO THE SKIN ONCE A WEEK. 1.5 mL 2   promethazine (PHENERGAN) 25 MG tablet TAKE 1 TABLET BY MOUTH EVERY 6 HOURS AS NEEDED FOR NAUSEA OR VOMITING. 30 tablet 0   Semaglutide, 1 MG/DOSE, 4 MG/3ML SOPN Inject 1 mg as directed once a week. 3 mL 3   venlafaxine XR (EFFEXOR-XR) 150 MG 24 hr capsule Take 2 capsules (300 mg total) by mouth daily with breakfast. 180 capsule 1   zonisamide (ZONEGRAN) 100  MG capsule Take 232m daily at bedtime 180 capsule 3   No current facility-administered medications on file prior to visit.   Allergies  Allergen Reactions   Penicillins Hives    Has patient had a PCN reaction causing immediate rash, facial/tongue/throat swelling, SOB or lightheadedness with hypotension: Yes Has patient had a PCN reaction causing severe rash involving mucus membranes or skin necrosis: No Has patient had a PCN reaction that required hospitalization No Has patient had a PCN reaction occurring within the last 10 years: No If all of the above answers are "NO", then may proceed with Cephalosporin use.     Toradol [Ketorolac Tromethamine] Hives   Hydromorphone     Other reaction(s): Hallucination   Lamotrigine Rash   Zofran [Ondansetron Hcl] Itching   Social History   Socioeconomic History   Marital status: Married    Spouse name: Not on file   Number of children: 2   Years of education: Associates    Highest education level: Not on file  Occupational History   Not on file  Tobacco Use   Smoking status: Former    Packs/day: 0.50    Years: 10.00    Total pack years: 5.00    Types: Cigarettes    Quit date: 05/15/1991    Years since quitting: 30.7   Smokeless tobacco: Never  Vaping Use   Vaping Use: Never used  Substance and Sexual Activity   Alcohol use: Yes    Comment: occassionally/socially, once every few months.   Drug use: No   Sexual activity: Yes    Birth control/protection: Surgical  Other Topics Concern   Not on file  Social History Narrative   Caffeine use: tea sometimes, soda sometimes   Right handed   Married x 5 years 01/27/21   1 son and 1 daughter. 5 grandchildren.    Social Determinants of Health   Financial Resource Strain: Low Risk  (02/10/2022)   Overall Financial Resource Strain (CARDIA)    Difficulty of Paying Living Expenses: Not hard at all  Food Insecurity: No Food Insecurity (02/10/2022)   Hunger Vital Sign    Worried About  Running Out of Food in the Last Year: Never true    Ran Out of Food in the Last Year: Never true  Transportation Needs: No Transportation Needs (02/10/2022)   PRAPARE - THydrologist(Medical): No    Lack of Transportation (Non-Medical): No  Physical Activity: Insufficiently Active (02/10/2022)   Exercise Vital Sign    Days of Exercise per Week: 2 days    Minutes of Exercise  per Session: 30 min  Stress: Stress Concern Present (02/10/2022)   [place]    Feeling of Stress : Very much  Social Connections: Moderately Integrated (02/10/2022)   Social Connection and Isolation Panel [NHANES]    Frequency of Communication with Friends and Family: More than three times a week    Frequency of Social Gatherings with Friends and Family: More than three times a week    Attends Religious Services: 1 to 4 times per year    Active Member of [employer] or Organizations: No    Attends Archivist Meetings: Never    Marital Status: Married  Human resources officer Violence: Not At Risk (02/10/2022)   Humiliation, Afraid, Rape, and Kick questionnaire    Fear of Current or Ex-Partner: No    Emotionally Abused: No    Physically Abused: No    Sexually Abused: No      Review of Systems  All other systems reviewed and are negative.      Objective:    Physical Exam Vitals reviewed.  Constitutional:      General: She is not in acute distress.    Appearance: Normal appearance. She is normal weight. She is not ill-appearing, toxic-appearing or diaphoretic.  HENT:     Head: Normocephalic.  Cardiovascular:     Rate and Rhythm: Normal rate and regular rhythm.     Heart sounds: No murmur heard.   Pulmonary:     Effort: Pulmonary effort is normal. No respiratory distress.     Breath sounds: Normal breath sounds.   Neurological:     General: No focal deficit present.     Mental Status: She is alert and  oriented to person, place, and time. Mental status is at baseline.     Cranial Nerves: No cranial nerve deficit.     Motor: No weakness.     Coordination: Coordination normal.     Gait: Gait normal.  Psychiatric:        Mood and Affect: Mood normal.        Behavior: Behavior normal.        Thought Content: Thought content normal.        Judgment: Judgment normal.  Patient is tender to palpation over the greater trochanter in the right hip.  There is no tenderness to palpation over the sciatic notch.  She is reports that the pain radiates down the iliotibial band to the knee area.  She has no pain with flexion or extension of the hip.    Assessment & Plan:  Benign essential HTN  Trochanteric bursitis of right hip I recommended a cortisone shot in the hip but I recommended she see her orthopedist for this.  Her blood pressure is elevated so I we will add hydrochlorothiazide 25 mg daily to amlodipine and recheck in 1 week.  I will give the patient 15 hydrocodone for pain and see if she can see her orthopedist.  She can continue Aleve and/or Tylenol for pain

## 2022-02-21 DIAGNOSIS — F331 Major depressive disorder, recurrent, moderate: Secondary | ICD-10-CM | POA: Diagnosis not present

## 2022-02-22 DIAGNOSIS — M25551 Pain in right hip: Secondary | ICD-10-CM | POA: Insufficient documentation

## 2022-02-23 ENCOUNTER — Ambulatory Visit: Payer: Medicare Other | Admitting: Family Medicine

## 2022-03-01 DIAGNOSIS — F331 Major depressive disorder, recurrent, moderate: Secondary | ICD-10-CM | POA: Diagnosis not present

## 2022-03-14 ENCOUNTER — Other Ambulatory Visit: Payer: Self-pay | Admitting: Family Medicine

## 2022-03-14 DIAGNOSIS — Z1231 Encounter for screening mammogram for malignant neoplasm of breast: Secondary | ICD-10-CM

## 2022-03-26 ENCOUNTER — Other Ambulatory Visit: Payer: Self-pay | Admitting: Family Medicine

## 2022-03-27 ENCOUNTER — Ambulatory Visit: Payer: Medicare Other | Admitting: Gastroenterology

## 2022-03-27 ENCOUNTER — Other Ambulatory Visit: Payer: Self-pay | Admitting: Family Medicine

## 2022-03-27 MED ORDER — SEMAGLUTIDE (1 MG/DOSE) 4 MG/3ML ~~LOC~~ SOPN: 1.0000 mg | PEN_INJECTOR | SUBCUTANEOUS | 3 refills | Status: DC

## 2022-03-27 MED ORDER — SEMAGLUTIDE (1 MG/DOSE) 4 MG/3ML ~~LOC~~ SOPN: 1.0000 mg | PEN_INJECTOR | SUBCUTANEOUS | 11 refills | Status: DC

## 2022-03-27 NOTE — Telephone Encounter (Signed)
Do you want to increase dose? Pls advice?

## 2022-03-28 ENCOUNTER — Ambulatory Visit
Admission: RE | Admit: 2022-03-28 | Discharge: 2022-03-28 | Disposition: A | Discharge status: Home / Self Care | Payer: Medicare Other | Source: Ambulatory Visit

## 2022-03-28 DIAGNOSIS — Z1231 Encounter for screening mammogram for malignant neoplasm of breast: Secondary | ICD-10-CM

## 2022-03-30 ENCOUNTER — Ambulatory Visit (INDEPENDENT_AMBULATORY_CARE_PROVIDER_SITE_OTHER): Payer: Commercial Managed Care - PPO | Admitting: Family Medicine

## 2022-03-30 ENCOUNTER — Encounter: Payer: Self-pay | Admitting: Family Medicine

## 2022-03-30 VITALS — BP 118/76 | HR 90 | Temp 98.5°F | Ht 65.0 in | Wt 205.0 lb

## 2022-03-30 DIAGNOSIS — R509 Fever, unspecified: Secondary | ICD-10-CM

## 2022-03-30 LAB — INFLUENZA A AND B AG, IMMUNOASSAY
INFLUENZA A ANTIGEN: NOT DETECTED
INFLUENZA B ANTIGEN: NOT DETECTED

## 2022-03-30 MED ORDER — PROMETHAZINE HCL 25 MG PO TABS: 25.0000 mg | ORAL_TABLET | Freq: Four times a day (QID) | ORAL | 0 refills | Status: DC | PRN

## 2022-03-30 NOTE — Progress Notes (Signed)
Subjective:    Patient ID: [name], female    DOB: 26-Apr-1965, 57 y.o.   MRN: [id number] Patient reports a 2-day history of fever, chills, dull headache, body aches, and cough.  She took a COVID test last night that was negative.  Flu test this morning is negative.  She denies any shortness of breath or chest pain.  She denies any sinus pain.  She denies any otalgia or sore throat. Past Medical History:  Diagnosis Date   Anxiety    Depression    Diabetes mellitus without complication ([place])    Encounter for neuropsychological testing    at [facility] 3/20-suggest possible somatization   Headache    Hypertension    Migraines    Pseudoseizure    PTSD (post-traumatic stress disorder)    Rosacea    Seizures ([place])    [facility] (Dr. 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  Pathology with tubular adenomas.  Recommended 5-year surveillance.   CYST EXCISION     on ovaries   lumbar     ruptured disc in lumbar taken out   POLYPECTOMY  07/20/2021   Procedure: POLYPECTOMY;  Surgeon: Daneil Dolin, MD;  [facility];  Service: Endoscopy;;   ROTATOR CUFF REPAIR Left    TONSILLECTOMY     TUBAL LIGATION     Current Outpatient Medications on File Prior to Visit  Medication Sig Dispense Refill   ACCU-CHEK GUIDE test strip USE AS DIRECTED TO MONITOR FSBS 1X DAILY. DX: R73.09. 100 strip 11   amLODipine (NORVASC) 5 MG tablet TAKE 1 TABLET IN THE MORNING & 1/2 A TABLET IN THE EVENING 45 tablet 0   atorvastatin (LIPITOR) 20 MG tablet Take 1 tablet  (20 mg total) by mouth daily. 90 tablet 3   Blood Glucose Monitoring Suppl (BLOOD GLUCOSE SYSTEM PAK) KIT Please dispense based on patient and insurance preference. Use as directed to monitor FSBS 1x daily. Dx: R73.09. 1 kit 1   buPROPion (WELLBUTRIN XL) 150 MG 24 hr tablet Take 1 tablet (150 mg total) by mouth daily. 30 tablet 3   butalbital-acetaminophen-caffeine (FIORICET) 50-325-40 MG tablet Take 2 tablets by mouth 2 (two) times daily as needed for headache.     clotrimazole-betamethasone (LOTRISONE) cream Apply 1 Application topically 2 (two) times daily. 30 g 0   fluticasone (FLONASE) 50 MCG/ACT nasal spray PLACE 1 SPRAY INTO BOTH NOSTRILS 2 (TWO) TIMES DAILY 16 mL 11   hydrochlorothiazide (HYDRODIURIL) 25 MG tablet Take 1 tablet (25 mg total) by mouth daily. 90 tablet 3   HYDROcodone-acetaminophen (NORCO/VICODIN) 5-325 MG tablet Take 1 tablet by mouth every 4 (four) hours as needed. 15 tablet 0   Lancets MISC Please dispense based on patient and insurance preference. Use as directed to monitor FSBS 1x daily. Dx: R73.09. 50 each 0   LORazepam (ATIVAN) 0.5 MG tablet Take 1 tablet (0.5 mg total) by mouth every 8 (eight) hours as needed for anxiety. 30 tablet 0   methocarbamol (ROBAXIN)  500 MG tablet Take by mouth.     ondansetron (ZOFRAN) 4 MG tablet TAKE 1 TABLET BY MOUTH EVERY 8 HOURS AS NEEDED FOR NAUSEA AND VOMITING 18 tablet 1   OZEMPIC, 0.25 OR 0.5 MG/DOSE, 2 MG/3ML SOPN INJECT 0.5 MG INTO THE SKIN ONCE A WEEK. 1.5 mL 2   Semaglutide, 1 MG/DOSE, 4 MG/3ML SOPN Inject 1 mg as directed once a week. 3 mL 11   Semaglutide, 1 MG/DOSE, 4 MG/3ML SOPN Inject 1 mg as directed once a week. 3 mL 3   venlafaxine XR (EFFEXOR-XR) 150 MG 24 hr capsule Take 2 capsules (300 mg total) by mouth daily with breakfast. 180 capsule 1   zonisamide (ZONEGRAN) 100 MG capsule Take '200mg'$  daily at bedtime 180 capsule 3   No current facility-administered medications on file prior to visit.   Allergies  Allergen  Reactions   Penicillins Hives    Has patient had a PCN reaction causing immediate rash, facial/tongue/throat swelling, SOB or lightheadedness with hypotension: Yes Has patient had a PCN reaction causing severe rash involving mucus membranes or skin necrosis: No Has patient had a PCN reaction that required hospitalization No Has patient had a PCN reaction occurring within the last 10 years: No If all of the above answers are "NO", then may proceed with Cephalosporin use.     Toradol [Ketorolac Tromethamine] Hives   Hydromorphone     Other reaction(s): Hallucination   Lamotrigine Rash   Zofran [Ondansetron Hcl] Itching   Social History   Socioeconomic History   Marital status: Married    Spouse name: Not on file   Number of children: 2   Years of education: Associates    Highest education level: Not on file  Occupational History   Not on file  Tobacco Use   Smoking status: Former    Packs/day: 0.50    Years: 10.00    Total pack years: 5.00    Types: Cigarettes    Quit date: 05/15/1991    Years since quitting: 30.8   Smokeless tobacco: Never  Vaping Use   Vaping Use: Never used  Substance and Sexual Activity   Alcohol use: Yes    Comment: occassionally/socially, once every few months.   Drug use: No   Sexual activity: Yes    Birth control/protection: Surgical  Other Topics Concern   Not on file  Social History Narrative   Caffeine use: tea sometimes, soda sometimes   Right handed   Married x 5 years 01/27/21   1 son and 1 daughter. 5 grandchildren.    Social Determinants of Health   Financial Resource Strain: Low Risk  (02/10/2022)   Overall Financial Resource Strain (CARDIA)    Difficulty of Paying Living Expenses: Not hard at all  Food Insecurity: No Food Insecurity (02/10/2022)   Hunger Vital Sign    Worried About Running Out of Food in the Last Year: Never true    Ran Out of Food in the Last Year: Never true  Transportation Needs: No Transportation Needs  (02/10/2022)   PRAPARE - Hydrologist (Medical): No    Lack of Transportation (Non-Medical): No  Physical Activity: Insufficiently Active (02/10/2022)   Exercise Vital Sign    Days of Exercise per Week: 2 days    Minutes of Exercise per Session: 30 min  Stress: Stress Concern Present (02/10/2022)   [place]    Feeling of Stress : Very much  Social  Connections: Moderately Integrated (02/10/2022)   Social Connection and Isolation Panel [NHANES]    Frequency of Communication with Friends and Family: More than three times a week    Frequency of Social Gatherings with Friends and Family: More than three times a week    Attends Religious Services: 1 to 4 times per year    Active Member of [employer] or Organizations: No    Attends Archivist Meetings: Never    Marital Status: Married  Human resources officer Violence: Not At Risk (02/10/2022)   Humiliation, Afraid, Rape, and Kick questionnaire    Fear of Current or Ex-Partner: No    Emotionally Abused: No    Physically Abused: No    Sexually Abused: No      Review of Systems  All other systems reviewed and are negative.      Objective:    Physical Exam Vitals reviewed.  Constitutional:      General: She is not in acute distress.    Appearance: Normal appearance. She is normal weight. She is not ill-appearing, toxic-appearing or diaphoretic.  HENT:     Head: Normocephalic.  TMs and canals are clear to examination bilaterally.  There is no erythema or exudate in the posterior oropharynx.  There is minimal nasal congestion.  There is no sinus tenderness. Cardiovascular:     Rate and Rhythm: Normal rate and regular rhythm.     Heart sounds: No murmur heard.   Pulmonary:     Effort: Pulmonary effort is normal. No respiratory distress.     Breath sounds: Normal breath sounds.   Neurological:     General: No focal deficit present.      Mental Status: She is alert and oriented to person, place, and time. Mental status is at baseline.     Cranial Nerves: No cranial nerve deficit.     Motor: No weakness.     Coordination: Coordination normal.     Gait: Gait normal.  Psychiatric:        Mood and Affect: Mood normal.        Behavior: Behavior normal.        Thought Content: Thought content normal.        Judgment: Judgment normal.   Assessment & Plan:  Fever, unspecified fever cause - Plan: Influenza A and B Ag, Immunoassay Flu test is negative.  COVID test at home was negative.  I suspect the patient has a viral upper respiratory infection.  I recommended rest.  I recommended that she push fluids.  Use ibuprofen 800 mg every 8 hours as needed for fever and headaches and bodyaches.  Anticipate gradual improvement over the next 5 days

## 2022-04-18 ENCOUNTER — Ambulatory Visit (INDEPENDENT_AMBULATORY_CARE_PROVIDER_SITE_OTHER): Payer: Commercial Managed Care - PPO | Admitting: Family Medicine

## 2022-04-18 ENCOUNTER — Encounter: Payer: Self-pay | Admitting: Family Medicine

## 2022-04-18 VITALS — BP 128/72 | HR 84 | Temp 98.2°F | Ht 65.0 in | Wt 208.0 lb

## 2022-04-18 DIAGNOSIS — R5383 Other fatigue: Secondary | ICD-10-CM

## 2022-04-18 DIAGNOSIS — E118 Type 2 diabetes mellitus with unspecified complications: Secondary | ICD-10-CM

## 2022-04-18 NOTE — Progress Notes (Signed)
Subjective:    Patient ID: [name], female    DOB: October 27, 1965, 57 y.o.   MRN: [id number]  Patient is here today to discuss her weight loss.  She is on Ozempic and she states this causes daily nausea to the point that she has to take Zofran.  She is unable to tolerate it so she discontinued the medicine.  She was previously on Ozempic for prediabetes and weight loss.  She has been off medication now for 2 weeks.  She is interested in other options for weight loss.  She also complains of severe fatigue.  She states that she just does not have any energy.  She has not been exercising however she reports that she is eating grilled and baked fish and chicken.  She tends to avoid beef and pork and junk food. Past Medical History:  Diagnosis Date   Anxiety    Depression    Diabetes mellitus without complication ([place])    Encounter for neuropsychological testing    at [facility] 3/20-suggest possible somatization   Headache    Hypertension    Migraines    Pseudoseizure    PTSD (post-traumatic stress disorder)    Rosacea    Seizures ([place])    [facility] (Dr. 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  Pathology with tubular adenomas.  Recommended 5-year surveillance.   CYST EXCISION     on ovaries   lumbar     ruptured disc in lumbar taken out   POLYPECTOMY  07/20/2021   Procedure: POLYPECTOMY;  Surgeon: Daneil Dolin, MD;  [facility];  Service: Endoscopy;;   ROTATOR CUFF REPAIR Left    TONSILLECTOMY     TUBAL LIGATION     Current  Outpatient Medications on File Prior to Visit  Medication Sig Dispense Refill   ACCU-CHEK GUIDE test strip USE AS DIRECTED TO MONITOR FSBS 1X DAILY. DX: R73.09. 100 strip 11   amLODipine (NORVASC) 5 MG tablet TAKE 1 TABLET IN THE MORNING & 1/2 A TABLET IN THE EVENING 45 tablet 0   atorvastatin (LIPITOR) 20 MG tablet Take 1 tablet (20 mg total) by mouth daily. 90 tablet 3   Blood Glucose Monitoring Suppl (BLOOD GLUCOSE SYSTEM PAK) KIT Please dispense based on patient and insurance preference. Use as directed to monitor FSBS 1x daily. Dx: R73.09. 1 kit 1   buPROPion (WELLBUTRIN XL) 150 MG 24 hr tablet Take 1 tablet (150 mg total) by mouth daily. 30 tablet 3   butalbital-acetaminophen-caffeine (FIORICET) 50-325-40 MG tablet Take 2 tablets by mouth 2 (two) times daily as needed for headache.     clotrimazole-betamethasone (LOTRISONE) cream Apply 1 Application topically 2 (two) times daily. 30 g 0   fluticasone (FLONASE) 50 MCG/ACT nasal spray PLACE 1 SPRAY INTO BOTH NOSTRILS 2 (TWO) TIMES DAILY 16 mL 11   hydrochlorothiazide (HYDRODIURIL) 25 MG tablet Take 1 tablet (25 mg total) by mouth daily. 90 tablet 3   HYDROcodone-acetaminophen (NORCO/VICODIN) 5-325 MG tablet Take 1 tablet  by mouth every 4 (four) hours as needed. 15 tablet 0   Lancets MISC Please dispense based on patient and insurance preference. Use as directed to monitor FSBS 1x daily. Dx: R73.09. 50 each 0   LORazepam (ATIVAN) 0.5 MG tablet Take 1 tablet (0.5 mg total) by mouth every 8 (eight) hours as needed for anxiety. 30 tablet 0   methocarbamol (ROBAXIN) 500 MG tablet Take by mouth.     ondansetron (ZOFRAN) 4 MG tablet TAKE 1 TABLET BY MOUTH EVERY 8 HOURS AS NEEDED FOR NAUSEA AND VOMITING 18 tablet 1   OZEMPIC, 0.25 OR 0.5 MG/DOSE, 2 MG/3ML SOPN INJECT 0.5 MG INTO THE SKIN ONCE A WEEK. 1.5 mL 2   promethazine (PHENERGAN) 25 MG tablet Take 1 tablet (25 mg total) by mouth every 6 (six) hours as needed. 30 tablet 0   Semaglutide, 1 MG/DOSE,  4 MG/3ML SOPN Inject 1 mg as directed once a week. 3 mL 11   Semaglutide, 1 MG/DOSE, 4 MG/3ML SOPN Inject 1 mg as directed once a week. 3 mL 3   venlafaxine XR (EFFEXOR-XR) 150 MG 24 hr capsule Take 2 capsules (300 mg total) by mouth daily with breakfast. 180 capsule 1   zonisamide (ZONEGRAN) 100 MG capsule Take 292m daily at bedtime 180 capsule 3   No current facility-administered medications on file prior to visit.   Allergies  Allergen Reactions   Penicillins Hives    Has patient had a PCN reaction causing immediate rash, facial/tongue/throat swelling, SOB or lightheadedness with hypotension: Yes Has patient had a PCN reaction causing severe rash involving mucus membranes or skin necrosis: No Has patient had a PCN reaction that required hospitalization No Has patient had a PCN reaction occurring within the last 10 years: No If all of the above answers are "NO", then may proceed with Cephalosporin use.     Toradol [Ketorolac Tromethamine] Hives   Hydromorphone     Other reaction(s): Hallucination   Lamotrigine Rash   Zofran [Ondansetron Hcl] Itching   Social History   Socioeconomic History   Marital status: Married    Spouse name: Not on file   Number of children: 2   Years of education: Associates    Highest education level: Not on file  Occupational History   Not on file  Tobacco Use   Smoking status: Former    Packs/day: 0.50    Years: 10.00    Total pack years: 5.00    Types: Cigarettes    Quit date: 05/15/1991    Years since quitting: 30.9   Smokeless tobacco: Never  Vaping Use   Vaping Use: Never used  Substance and Sexual Activity   Alcohol use: Yes    Comment: occassionally/socially, once every few months.   Drug use: No   Sexual activity: Yes    Birth control/protection: Surgical  Other Topics Concern   Not on file  Social History Narrative   Caffeine use: tea sometimes, soda sometimes   Right handed   Married x 5 years 01/27/21   1 son and 1  daughter. 5 grandchildren.    Social Determinants of Health   Financial Resource Strain: Low Risk  (02/10/2022)   Overall Financial Resource Strain (CARDIA)    Difficulty of Paying Living Expenses: Not hard at all  Food Insecurity: No Food Insecurity (02/10/2022)   Hunger Vital Sign    Worried About Running Out of Food in the Last Year: Never true    Ran Out of Food in the Last  Year: Never true  Transportation Needs: No Transportation Needs (02/10/2022)   PRAPARE - Hydrologist (Medical): No    Lack of Transportation (Non-Medical): No  Physical Activity: Insufficiently Active (02/10/2022)   Exercise Vital Sign    Days of Exercise per Week: 2 days    Minutes of Exercise per Session: 30 min  Stress: Stress Concern Present (02/10/2022)   [place]    Feeling of Stress : Very much  Social Connections: Moderately Integrated (02/10/2022)   Social Connection and Isolation Panel [NHANES]    Frequency of Communication with Friends and Family: More than three times a week    Frequency of Social Gatherings with Friends and Family: More than three times a week    Attends Religious Services: 1 to 4 times per year    Active Member of [employer] or Organizations: No    Attends Archivist Meetings: Never    Marital Status: Married  Human resources officer Violence: Not At Risk (02/10/2022)   Humiliation, Afraid, Rape, and Kick questionnaire    Fear of Current or Ex-Partner: No    Emotionally Abused: No    Physically Abused: No    Sexually Abused: No      Review of Systems  All other systems reviewed and are negative.      Objective:    Physical Exam Vitals reviewed.  Constitutional:      General: She is not in acute distress.    Appearance: Normal appearance. She is normal weight. She is not ill-appearing, toxic-appearing or diaphoretic.  HENT:     Head: Normocephalic.  Cardiovascular:      Rate and Rhythm: Normal rate and regular rhythm.     Heart sounds: No murmur heard.   Pulmonary:     Effort: Pulmonary effort is normal. No respiratory distress.     Breath sounds: Normal breath sounds.   Neurological:     General: No focal deficit present.     Mental Status: She is alert and oriented to person, place, and time. Mental status is at baseline.     Cranial Nerves: No cranial nerve deficit.     Motor: No weakness.     Coordination: Coordination normal.     Gait: Gait normal.  Psychiatric:        Mood and Affect: Mood normal.        Behavior: Behavior normal.        Thought Content: Thought content normal.        Judgment: Judgment normal.     Assessment & Plan:  Controlled type 2 diabetes mellitus with complication, without long-term current use of insulin (HCC) - Plan: COMPLETE METABOLIC PANEL WITH GFR, Lipid panel, Hemoglobin A1c, Protein / Creatinine Ratio, Urine  Other fatigue - Plan: Vitamin B12, TSH I would like to check her blood sugar to ensure that her diabetes is well-controlled.  We discussed options and she would be willing to try Victoza to see if she can tolerate that better to continue to facilitate weight loss and manage her blood sugar.  Given her fatigue we will check a TSH and a B12.  However to improve her weight loss I recommended a Mediterranean diet.  I recommended limiting her calories to less than 200 today.  Recommended exercise 30 minutes a day of aerobic exercise.  I believe all this will also help her energy level gradually over time

## 2022-04-19 LAB — LIPID PANEL
Cholesterol: 184 mg/dL (ref <200)
HDL: 73 mg/dL (ref >=50)
LDL Cholesterol (Calc): 94 mg/dL (calc)
Non-HDL Cholesterol (Calc): 111 mg/dL (calc) (ref <130)
Total CHOL/HDL Ratio: 2.5 (calc) (ref <5.0)
Triglycerides: 81 mg/dL (ref <150)

## 2022-04-19 LAB — HEMOGLOBIN A1C
Hgb A1c MFr Bld: 5.7 % of total Hgb — ABNORMAL HIGH (ref <5.7)
Mean Plasma Glucose: 117 mg/dL
eAG (mmol/L): 6.5 mmol/L

## 2022-04-19 LAB — TSH: TSH: 1.12 mIU/L

## 2022-04-19 LAB — COMPLETE METABOLIC PANEL WITH GFR
AG Ratio: 1.5 (calc) (ref 1.0–2.5)
ALT: 15 U/L (ref 6–29)
AST: 12 U/L (ref 10–35)
Albumin: 3.9 g/dL (ref 3.6–5.1)
Alkaline phosphatase (APISO): 141 U/L (ref 37–153)
BUN: 9 mg/dL (ref 7–25)
CO2: 23 mmol/L (ref 20–32)
Calcium: 9.2 mg/dL (ref 8.6–10.4)
Chloride: 107 mmol/L (ref 98–110)
Creat: 0.68 mg/dL (ref 0.50–1.03)
Globulin: 2.6 g/dL (calc) (ref 1.9–3.7)
Glucose, Bld: 105 mg/dL — ABNORMAL HIGH (ref 65–99)
Potassium: 4.9 mmol/L (ref 3.5–5.3)
Sodium: 141 mmol/L (ref 135–146)
Total Bilirubin: 0.5 mg/dL (ref 0.2–1.2)
Total Protein: 6.5 g/dL (ref 6.1–8.1)
eGFR: 103 mL/min/{1.73_m2} (ref >=60)

## 2022-04-19 LAB — PROTEIN / CREATININE RATIO, URINE
Creatinine, Urine: 147 mg/dL (ref 20–275)
Protein/Creat Ratio: 82 mg/g creat (ref 24–184)
Protein/Creatinine Ratio: 0.082 mg/mg creat (ref 0.024–0.184)
Total Protein, Urine: 12 mg/dL (ref 5–24)

## 2022-04-19 LAB — VITAMIN B12: Vitamin B-12: 347 pg/mL (ref 200–1100)

## 2022-04-21 ENCOUNTER — Encounter: Payer: Self-pay | Admitting: Family Medicine

## 2022-04-25 ENCOUNTER — Ambulatory Visit: Payer: Medicare Other | Admitting: Gastroenterology

## 2022-04-28 ENCOUNTER — Other Ambulatory Visit: Payer: Self-pay | Admitting: Family Medicine

## 2022-04-28 ENCOUNTER — Telehealth: Payer: Self-pay

## 2022-04-28 MED ORDER — ONDANSETRON HCL 4 MG PO TABS: ORAL_TABLET | ORAL | 1 refills | Status: DC

## 2022-04-28 MED ORDER — VICTOZA 18 MG/3ML ~~LOC~~ SOPN: 0.6000 mg | PEN_INJECTOR | Freq: Every day | SUBCUTANEOUS | 1 refills | Status: DC

## 2022-04-28 NOTE — Telephone Encounter (Signed)
Pt called asking if she can go ahead and start Victoza. Pt also asks if an Rx for Zofran can be called in in case the Victoza causes nausea? Pt asks if she should take a B12 supplement since her B12 was on the low end of normal? Thank you.

## 2022-04-28 NOTE — Progress Notes (Signed)
Pt c/o elevated BP for last 2 days. Pt states BP's have been in 150-160's-90-100's. Pt advised per Dr. Dennard Schaumann to increase Amlodipine to 10 mg per day. Pt verbalized understanding of all.

## 2022-04-28 NOTE — Progress Notes (Unsigned)
GI Office Note    Referring Provider: [name], MD Primary Care Physician:  [name], MD  Primary Gastroenterologist: Garfield Cornea, MD   Chief Complaint   No chief complaint on file.   History of Present Illness   [name] is a 57 y.o. female presenting today for follow-up.  Last seen in September 2023.  She has a history of chronic constipation, adenomatous colon polyps, elevated LFTs, fatty liver.  Hepatitis C antibody negative remotely.  Right upper quadrant ultrasound August 2022: Diffuse hepatic steatosis with probable fatty sparing along the gallbladder fossa.    Colonoscopy 07/20/2021: Three 6-8 mm polyps in the descending colon and cecum removed, diverticulosis in the entire colon, otherwise normal exam. Pathology with tubular adenomas. Recommended 5-year surveillance.   Medications   Current Outpatient Medications  Medication Sig Dispense Refill   ACCU-CHEK GUIDE test strip USE AS DIRECTED TO MONITOR FSBS 1X DAILY. DX: R73.09. 100 strip 11   amLODipine (NORVASC) 5 MG tablet TAKE 1 TABLET IN THE MORNING & 1/2 A TABLET IN THE EVENING 45 tablet 0   atorvastatin (LIPITOR) 20 MG tablet Take 1 tablet (20 mg total) by mouth daily. 90 tablet 3   Blood Glucose Monitoring Suppl (BLOOD GLUCOSE SYSTEM PAK) KIT Please dispense based on patient and insurance preference. Use as directed to monitor FSBS 1x daily. Dx: R73.09. 1 kit 1   buPROPion (WELLBUTRIN XL) 150 MG 24 hr tablet Take 1 tablet (150 mg total) by mouth daily. 30 tablet 3   butalbital-acetaminophen-caffeine (FIORICET) 50-325-40 MG tablet Take 2 tablets by mouth 2 (two) times daily as needed for headache.     clotrimazole-betamethasone (LOTRISONE) cream Apply 1 Application topically 2 (two) times daily. 30 g 0   fluticasone (FLONASE) 50 MCG/ACT nasal spray PLACE 1 SPRAY INTO BOTH NOSTRILS 2 (TWO) TIMES DAILY 16 mL 11   hydrochlorothiazide (HYDRODIURIL) 25 MG tablet Take 1 tablet (25 mg total) by  mouth daily. 90 tablet 3   HYDROcodone-acetaminophen (NORCO/VICODIN) 5-325 MG tablet Take 1 tablet by mouth every 4 (four) hours as needed. 15 tablet 0   Lancets MISC Please dispense based on patient and insurance preference. Use as directed to monitor FSBS 1x daily. Dx: R73.09. 50 each 0   LORazepam (ATIVAN) 0.5 MG tablet Take 1 tablet (0.5 mg total) by mouth every 8 (eight) hours as needed for anxiety. 30 tablet 0   methocarbamol (ROBAXIN) 500 MG tablet Take by mouth.     ondansetron (ZOFRAN) 4 MG tablet TAKE 1 TABLET BY MOUTH EVERY 8 HOURS AS NEEDED FOR NAUSEA AND VOMITING (Patient not taking: Reported on 04/18/2022) 18 tablet 1   promethazine (PHENERGAN) 25 MG tablet Take 1 tablet (25 mg total) by mouth every 6 (six) hours as needed. (Patient not taking: Reported on 04/18/2022) 30 tablet 0   Semaglutide, 1 MG/DOSE, 4 MG/3ML SOPN Inject 1 mg as directed once a week. (Patient not taking: Reported on 04/18/2022) 3 mL 11   Semaglutide, 1 MG/DOSE, 4 MG/3ML SOPN Inject 1 mg as directed once a week. (Patient not taking: Reported on 04/18/2022) 3 mL 3   venlafaxine XR (EFFEXOR-XR) 150 MG 24 hr capsule Take 2 capsules (300 mg total) by mouth daily with breakfast. 180 capsule 1   zonisamide (ZONEGRAN) 100 MG capsule Take '200mg'$  daily at bedtime 180 capsule 3   No current facility-administered medications for this visit.    Allergies   Allergies as of 05/01/2022 - Review Complete 04/18/2022  Allergen Reaction Noted   Penicillins Hives 11/14/2010   Toradol [ketorolac tromethamine] Hives 03/14/2016   Hydromorphone  07/07/2021   Lamotrigine Rash 12/23/2015   Zofran [ondansetron hcl] Itching 05/31/2016     Past Medical History   Past Medical History:  Diagnosis Date   Anxiety    Depression    Diabetes mellitus without complication ([place])    Encounter for neuropsychological testing    at [facility] 3/20-suggest possible somatization   Headache    Hypertension    Migraines    Pseudoseizure    PTSD  (post-traumatic stress disorder)    Rosacea    Seizures ([place])    [facility] (Dr. 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  Pathology with tubular adenomas.  Recommended 5-year surveillance.   CYST EXCISION     on ovaries   lumbar     ruptured disc in lumbar taken out   POLYPECTOMY  07/20/2021   Procedure: POLYPECTOMY;  Surgeon: Daneil Dolin, MD;  [facility];  Service: Endoscopy;;   ROTATOR CUFF REPAIR Left    TONSILLECTOMY     TUBAL LIGATION      Past Family History   Family History  Problem Relation Age of Onset   Diabetes Father    Heart disease Father 61   Hypertension Father    Breast cancer Paternal Aunt        ? age of dx   Cancer Maternal Grandfather        colon cancer (70's)   Diabetes Paternal Grandmother    Diabetes Paternal Grandfather    Cancer Cousin        breast   Breast cancer Cousin    Breast cancer Cousin        paternal   Breast cancer Cousin    Liver cancer Neg Hx     Past Social History   Social History   Socioeconomic History   Marital status: Married    Spouse name: Not on file   Number of children: 2   Years of education: Associates    Highest education level: Not on file  Occupational History   Not on file  Tobacco Use   Smoking status: Former    Packs/day: 0.50    Years: 10.00    Total pack years: 5.00    Types: Cigarettes    Quit date: 05/15/1991    Years since quitting: 30.9   Smokeless tobacco: Never  Vaping Use   Vaping Use: Never used   Substance and Sexual Activity   Alcohol use: Yes    Comment: occassionally/socially, once every few months.   Drug use: No   Sexual activity: Yes    Birth control/protection: Surgical  Other Topics Concern   Not on file  Social History Narrative   Caffeine use: tea sometimes, soda sometimes   Right handed   Married x 5 years 01/27/21   1 son and 1 daughter. 5 grandchildren.    Social Determinants  of Health   Financial Resource Strain: Low Risk  (02/10/2022)   Overall Financial Resource Strain (CARDIA)    Difficulty of Paying Living Expenses: Not hard at all  Food Insecurity: No Food Insecurity (02/10/2022)   Hunger Vital Sign    Worried About Running Out of Food in the Last Year: Never true    Ran Out of Food in the Last Year: Never true  Transportation Needs: No Transportation Needs (02/10/2022)   PRAPARE - Hydrologist (Medical): No    Lack of Transportation (Non-Medical): No  Physical Activity: Insufficiently Active (02/10/2022)   Exercise Vital Sign    Days of Exercise per Week: 2 days    Minutes of Exercise per Session: 30 min  Stress: Stress Concern Present (02/10/2022)   [place]    Feeling of Stress : Very much  Social Connections: Moderately Integrated (02/10/2022)   Social Connection and Isolation Panel [NHANES]    Frequency of Communication with Friends and Family: More than three times a week    Frequency of Social Gatherings with Friends and Family: More than three times a week    Attends Religious Services: 1 to 4 times per year    Active Member of [employer] or Organizations: No    Attends Archivist Meetings: Never    Marital Status: Married  Human resources officer Violence: Not At Risk (02/10/2022)   Humiliation, Afraid, Rape, and Kick questionnaire    Fear of Current or Ex-Partner: No    Emotionally Abused: No    Physically Abused: No    Sexually Abused: No     Review of Systems   General: Negative for anorexia, weight loss, fever, chills, fatigue, weakness. ENT: Negative for hoarseness, difficulty swallowing , nasal congestion. CV: Negative for chest pain, angina, palpitations, dyspnea on exertion, peripheral edema.  Respiratory: Negative for dyspnea at rest, dyspnea on exertion, cough, sputum, wheezing.  GI: See history of present illness. GU:  Negative for dysuria, hematuria, urinary incontinence, urinary frequency, nocturnal urination.  Endo: Negative for unusual weight change.     Physical Exam   There were no vitals taken for this visit.   General: Well-nourished, well-developed in no acute distress.  Eyes: No icterus. Mouth: Oropharyngeal mucosa moist and pink , no lesions erythema or exudate. Lungs: Clear to auscultation bilaterally.  Heart: Regular rate and rhythm, no murmurs rubs or gallops.  Abdomen: Bowel sounds are normal, nontender, nondistended, no hepatosplenomegaly or masses,  no abdominal bruits or hernia , no rebound or guarding.  Rectal: ***  Extremities: No lower extremity edema. No clubbing or deformities. Neuro: Alert and oriented x 4   Skin: Warm and dry, no jaundice.   Psych: Alert and cooperative, normal mood and affect.  Labs   Lab Results  Component Value Date   TSH 1.12 04/18/2022   Lab Results  Component Value Date   CREATININE 0.68 04/18/2022   BUN 9 04/18/2022   NA 141 04/18/2022   K 4.9 04/18/2022   CL 107 04/18/2022   CO2 23 04/18/2022   Lab Results  Component Value Date   ALT 15 04/18/2022   AST 12 04/18/2022   ALKPHOS 117 11/12/2019   BILITOT 0.5 04/18/2022   Lab Results  Component Value Date   WBC 6.7 10/18/2020   HGB 13.6 10/18/2020   HCT 41.0 10/18/2020   MCV 88.0 10/18/2020   PLT 304 10/18/2020   Lab Results  Component Value Date  HGBA1C 5.7 (H) 04/18/2022   Lab Results  Component Value Date   VITAMINB12 347 04/18/2022    Imaging Studies   No results  found.  Assessment       PLAN   ***AMA   Laureen Ochs. Bobby Rumpf, [place], [facility]

## 2022-04-28 NOTE — Telephone Encounter (Signed)
ERROR, PLEASE DISREGARD

## 2022-05-01 ENCOUNTER — Ambulatory Visit (INDEPENDENT_AMBULATORY_CARE_PROVIDER_SITE_OTHER): Payer: Commercial Managed Care - PPO | Admitting: Gastroenterology

## 2022-05-01 ENCOUNTER — Encounter: Payer: Self-pay | Admitting: Gastroenterology

## 2022-05-01 VITALS — BP 119/82 | HR 73 | Temp 98.1°F | Ht 66.0 in | Wt 205.0 lb

## 2022-05-01 DIAGNOSIS — K760 Fatty (change of) liver, not elsewhere classified: Secondary | ICD-10-CM

## 2022-05-01 DIAGNOSIS — R7989 Other specified abnormal findings of blood chemistry: Secondary | ICD-10-CM | POA: Diagnosis not present

## 2022-05-01 DIAGNOSIS — K5900 Constipation, unspecified: Secondary | ICD-10-CM

## 2022-05-01 NOTE — Patient Instructions (Signed)
For constipation, please start Miralax one capful twice daily until soft stool, then continue once daily. If you are unable to have soft regular stools with this regimen, please call and we will send in prescription option.  We will update labs in 3 months, you will receive reminder closer to due date. Return office visit in six months.   It was a pleasure to see you today. I want to create trusting relationships with patients and provide genuine, compassionate, and quality care. I truly value your feedback, so please be on the lookout for a survey regarding your visit with me today. I appreciate your time in completing this!

## 2022-05-04 ENCOUNTER — Other Ambulatory Visit: Payer: Self-pay | Admitting: Family Medicine

## 2022-05-04 ENCOUNTER — Other Ambulatory Visit: Payer: Self-pay

## 2022-05-04 ENCOUNTER — Telehealth: Payer: Self-pay | Admitting: Family Medicine

## 2022-05-04 DIAGNOSIS — E118 Type 2 diabetes mellitus with unspecified complications: Secondary | ICD-10-CM

## 2022-05-04 MED ORDER — BD PEN NEEDLE NANO 2ND GEN 32G X 4 MM MISC: 1 refills | Status: DC

## 2022-05-04 NOTE — Telephone Encounter (Signed)
Prescription Request  05/04/2022   LOV: 04/18/2022  What is the name of the medication or equipment?   BD NANO 2 GEL PEN NDL 32G 4MM **PATIENT NEEDS PEN NEEDLES TO INJECT VICTOZA**  Have you contacted your pharmacy to request a refill? Yes   Which pharmacy would you like this sent to?  [id number] [place] [address]Phone: [phone]: [phone]   Patient notified that their request is being sent to the clinical staff for review and that they should receive a response within 2 business days.   Please advise pharmacist at [phone]

## 2022-05-15 ENCOUNTER — Other Ambulatory Visit: Payer: Self-pay

## 2022-05-15 DIAGNOSIS — E118 Type 2 diabetes mellitus with unspecified complications: Secondary | ICD-10-CM

## 2022-05-15 MED ORDER — SITAGLIPTIN PHOSPHATE 100 MG PO TABS: 100.0000 mg | ORAL_TABLET | Freq: Every day | ORAL | 1 refills | Status: DC

## 2022-07-17 ENCOUNTER — Other Ambulatory Visit (HOSPITAL_COMMUNITY): Payer: Self-pay | Admitting: Neurological Surgery

## 2022-07-17 ENCOUNTER — Other Ambulatory Visit: Payer: Self-pay

## 2022-07-17 ENCOUNTER — Other Ambulatory Visit: Payer: Self-pay | Admitting: Neurological Surgery

## 2022-07-17 DIAGNOSIS — M5416 Radiculopathy, lumbar region: Secondary | ICD-10-CM

## 2022-07-17 DIAGNOSIS — R7989 Other specified abnormal findings of blood chemistry: Secondary | ICD-10-CM

## 2022-07-17 DIAGNOSIS — K760 Fatty (change of) liver, not elsewhere classified: Secondary | ICD-10-CM

## 2022-07-18 ENCOUNTER — Telehealth (HOSPITAL_COMMUNITY): Payer: Self-pay | Admitting: *Deleted

## 2022-07-19 ENCOUNTER — Other Ambulatory Visit (HOSPITAL_COMMUNITY): Payer: Self-pay | Admitting: Neurological Surgery

## 2022-07-19 ENCOUNTER — Ambulatory Visit (HOSPITAL_COMMUNITY)
Admission: RE | Admit: 2022-07-19 | Discharge: 2022-07-19 | Disposition: A | Discharge status: Home / Self Care | Payer: Commercial Managed Care - PPO | Source: Ambulatory Visit | Attending: Neurological Surgery | Admitting: Neurological Surgery

## 2022-07-19 ENCOUNTER — Other Ambulatory Visit: Payer: Self-pay

## 2022-07-19 DIAGNOSIS — M5416 Radiculopathy, lumbar region: Secondary | ICD-10-CM

## 2022-07-19 DIAGNOSIS — M48061 Spinal stenosis, lumbar region without neurogenic claudication: Secondary | ICD-10-CM | POA: Diagnosis not present

## 2022-07-19 DIAGNOSIS — M5116 Intervertebral disc disorders with radiculopathy, lumbar region: Secondary | ICD-10-CM | POA: Insufficient documentation

## 2022-07-19 DIAGNOSIS — N200 Calculus of kidney: Secondary | ICD-10-CM | POA: Insufficient documentation

## 2022-07-19 LAB — GLUCOSE, CAPILLARY: Glucose-Capillary: 138 mg/dL — ABNORMAL HIGH (ref 70–99)

## 2022-07-19 MED ORDER — DIAZEPAM 5 MG PO TABS
ORAL_TABLET | ORAL | Status: AC
  Administered 2022-07-19: 10 mg via ORAL
  Filled 2022-07-19: qty 2

## 2022-07-19 MED ORDER — ONDANSETRON HCL 4 MG/2ML IJ SOLN
INTRAMUSCULAR | Status: AC
  Administered 2022-07-19: 4 mg
  Filled 2022-07-19: qty 2

## 2022-07-19 MED ORDER — ONDANSETRON HCL 4 MG/2ML IJ SOLN: 4.0000 mg | Freq: Four times a day (QID) | INTRAMUSCULAR | Status: DC | PRN

## 2022-07-19 MED ORDER — SODIUM CHLORIDE 0.9 % IV SOLN: INTRAVENOUS | Status: DC

## 2022-07-19 MED ORDER — HYDROCODONE-ACETAMINOPHEN 5-325 MG PO TABS
ORAL_TABLET | ORAL | Status: AC
  Filled 2022-07-19: qty 1

## 2022-07-19 MED ORDER — LIDOCAINE HCL (PF) 1 % IJ SOLN
5.0000 mL | Freq: Once | INTRAMUSCULAR | Status: AC
  Administered 2022-07-19: 5 mL via INTRADERMAL

## 2022-07-19 MED ORDER — IOHEXOL 180 MG/ML  SOLN
20.0000 mL | Freq: Once | INTRAMUSCULAR | Status: AC | PRN
  Administered 2022-07-19: 12 mL via INTRATHECAL

## 2022-07-19 MED ORDER — HYDROCODONE-ACETAMINOPHEN 5-325 MG PO TABS
1.0000 | ORAL_TABLET | ORAL | Status: DC | PRN
  Administered 2022-07-19: 1 via ORAL

## 2022-07-19 MED ORDER — DIAZEPAM 5 MG PO TABS
10.0000 mg | ORAL_TABLET | Freq: Once | ORAL | Status: AC
  Filled 2022-07-19: qty 2

## 2022-07-19 NOTE — Progress Notes (Signed)
States nausea gone and pain 7/10 and states has 6-8/10 pain always

## 2022-07-19 NOTE — Progress Notes (Signed)
Dr Danielle Dess notified of client c/o 9/10 back pain and he states he will call in prescription for pain med; orders noted now for client c/o nausea

## 2022-07-19 NOTE — Progress Notes (Signed)
Dr Danielle Dess notified of client cont c/o back pain and no nausea and ok to d/c home

## 2022-07-19 NOTE — Procedures (Signed)
[name] is a 57 year old individual whose had previous decompression fusion at L5-S1 and L4-L5 and at L3-L4.  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  A lumbar myelogram is now being performed.  Pre op Dx: Lumbar radiculopathy status post arthrodesis L2 to sacrum Post op Dx: Same Procedure: Lumbar myelogram Surgeon: Dameon Soltis Puncture level: L2-3 Fluid color: Colorless Injection: Iohexol 180, 12 mL Findings: Relative stenosis L2-L3 further evaluation with CT scanning.

## 2022-07-19 NOTE — Discharge Instructions (Addendum)
Myelogram and Lumbar Puncture Discharge Instructions  Go home and rest quietly for the next 24 hours.  It is important to lie flat for the next 24 hours.  Get up only to go to the restroom.  You may lie in the bed or on a couch on your back, your stomach, your left side or your right side.  You may have one pillow under your head.  You may have pillows between your knees while you are on your side or under your knees while you are on your back.  DO NOT drive today.  Recline the seat as far back as it will go, while still wearing your seat belt, on the way home.  You may get up to go to the bathroom as needed.  You may sit up for 10 minutes to eat.  You may resume your normal diet and medications unless otherwise indicated.  The incidence of headache, nausea, or vomiting is about 5% (one in 20 patients).  If you develop a headache, lie flat and drink plenty of fluids until the headache goes away.  Caffeinated beverages may be helpful.  If you develop severe nausea and vomiting or a headache that does not go away with flat bed rest, call Dr Elsner.  You may resume normal activities after your 24 hours of bed rest is over; however, do not exert yourself strongly or do any heavy lifting tomorrow.  Call your physician for a follow-up appointment.  The results of your myelogram will be sent directly to your physician by the following day.  If you have any questions or if complications develop after you arrive home, please call Dr Elsner.  Discharge instructions have been explained to the patient.  The patient, or the person responsible for the patient, fully understands these instructions.   

## 2022-07-24 ENCOUNTER — Other Ambulatory Visit: Payer: Self-pay | Admitting: Neurological Surgery

## 2022-07-26 NOTE — Pre-Procedure Instructions (Addendum)
Surgical Instructions    Your procedure is scheduled on Aug 03, 2022.  Report to [facility] Main Entrance "A" at 12:00 P.M., then check in with the Admitting office.  Call this number if you have problems the morning of surgery:  [phone]  If you have any questions prior to your surgery date call [phone]: Open Monday-Friday 8am-4pm If you experience any cold or flu symptoms such as cough, fever, chills, shortness of breath, etc. between now and your scheduled surgery, please notify us at the above number.     Remember:  Do not eat after midnight the night before your surgery  You may drink clear liquids until 11:00 AM the morning of your surgery.   Clear liquids allowed are: Water, Non-Citrus Juices (without pulp), Carbonated Beverages, Clear Tea, Black Coffee Only (NO MILK, CREAM OR POWDERED CREAMER of any kind), and Gatorade.     Take these medicines the morning of surgery with A SIP OF WATER:  amLODipine (NORVASC)   buPROPion (WELLBUTRIN XL)   ORACEA   venlafaxine XR [facility])    May take these medicines IF NEEDED:  HYDROcodone-acetaminophen (NORCO/VICODIN)   ondansetron (ZOFRAN)   traMADol (ULTRAM)    As of today, STOP taking any Aspirin (unless otherwise instructed by your surgeon) Aleve, Naproxen, Ibuprofen, Motrin, Advil, Goody's, BC's, all herbal medications, fish oil, and all vitamins.   WHAT DO I DO ABOUT MY DIABETES MEDICATION?    DO NOT taking your sitaGLIPtin (JANUVIA) the morning of surgery.   HOW TO MANAGE YOUR DIABETES BEFORE AND AFTER SURGERY  Why is it important to control my blood sugar before and after surgery? Improving blood sugar levels before and after surgery helps healing and can limit problems. A way of improving blood sugar control is eating a healthy diet by:  Eating less sugar and carbohydrates  Increasing activity/exercise  Talking with your doctor about reaching your blood sugar goals High blood sugars (greater than 180  mg/dL) can raise your risk of infections and slow your recovery, so you will need to focus on controlling your diabetes during the weeks before surgery. Make sure that the doctor who takes care of your diabetes knows about your planned surgery including the date and location.  How do I manage my blood sugar before surgery? Check your blood sugar at least 4 times a day, starting 2 days before surgery, to make sure that the level is not too high or low.  Check your blood sugar the morning of your surgery when you wake up and every 2 hours until you get to the Short Stay unit.  If your blood sugar is less than 70 mg/dL, you will need to treat for low blood sugar: Do not take insulin. Treat a low blood sugar (less than 70 mg/dL) with  cup of clear juice (cranberry or apple), 4 glucose tablets, OR glucose gel. Recheck blood sugar in 15 minutes after treatment (to make sure it is greater than 70 mg/dL). If your blood sugar is not greater than 70 mg/dL on recheck, call [phone] for further instructions. Report your blood sugar to the short stay nurse when you get to Short Stay.  If you are admitted to the hospital after surgery: Your blood sugar will be checked by the staff and you will probably be given insulin after surgery (instead of oral diabetes medicines) to make sure you have good blood sugar levels. The goal for blood sugar control after surgery is 80-180 mg/dL.  Do NOT Smoke (Tobacco/Vaping) for 24 hours prior to your procedure.  If you use a CPAP at night, you may bring your mask/headgear for your overnight stay.   Contacts, glasses, piercing's, hearing aid's, dentures or partials may not be worn into surgery, please bring cases for these belongings.    For patients admitted to the hospital, discharge time will be determined by your treatment team.   Patients discharged the day of surgery will not be allowed to drive home, and someone needs to stay with them  for 24 hours.  SURGICAL WAITING ROOM VISITATION Patients having surgery or a procedure may have no more than 2 support people in the waiting area - these visitors may rotate.   Children under the age of 64 must have an adult with them who is not the patient. If the patient needs to stay at the hospital during part of their recovery, the visitor guidelines for inpatient rooms apply. Pre-op nurse will coordinate an appropriate time for 1 support person to accompany patient in pre-op.  This support person may not rotate.   Please refer to the [facility] website for the visitor guidelines for Inpatients (after your surgery is over and you are in a regular room).   If you received a COVID test during your pre-op visit  it is requested that you wear a mask when out in public, stay away from anyone that may not be feeling well and notify your surgeon if you develop symptoms. If you have been in contact with anyone that has tested positive in the last 10 days please notify you surgeon.    Pre-operative 5 CHG Bath Instructions   You can play a key role in reducing the risk of infection after surgery. Your skin needs to be as free of germs as possible. You can reduce the number of germs on your skin by washing with CHG (chlorhexidine gluconate) soap before surgery. CHG is an antiseptic soap that kills germs and continues to kill germs even after washing.   DO NOT use if you have an allergy to chlorhexidine/CHG or antibacterial soaps. If your skin becomes reddened or irritated, stop using the CHG and notify one of our RNs at [phone].   Please shower with the CHG soap starting 4 days before surgery using the following schedule:     Please keep in mind the following:  DO NOT shave, including legs and underarms, starting the day of your first shower.   You may shave your face at any point before/day of surgery.  Place clean sheets on your bed the day you start using CHG soap. Use a clean washcloth  (not used since being washed) for each shower. DO NOT sleep with pets once you start using the CHG.   CHG Shower Instructions:  If you choose to wash your hair and private area, wash first with your normal shampoo/soap.  After you use shampoo/soap, rinse your hair and body thoroughly to remove shampoo/soap residue.  Turn the water OFF and apply about 3 tablespoons (45 ml) of CHG soap to a CLEAN washcloth.  Apply CHG soap ONLY FROM YOUR NECK DOWN TO YOUR TOES (washing for 3-5 minutes)  DO NOT use CHG soap on face, private areas, open wounds, or sores.  Pay special attention to the area where your surgery is being performed.  If you are having back surgery, having someone wash your back for you may be helpful. Wait 2 minutes after CHG soap is applied, then you may rinse  off the CHG soap.  Pat dry with a clean towel  Put on clean clothes/pajamas   If you choose to wear lotion, please use ONLY the CHG-compatible lotions on the back of this paper.     Additional instructions for the day of surgery: DO NOT APPLY any lotions, deodorants, cologne, or perfumes.   Do not wear jewelry or makeup Do not bring valuables to the hospital. [facility] is not responsible for any belongings or valuables. Do not wear nail polish, gel polish, artificial nails, or any other type of covering on natural nails (fingers and toes) Put on clean/comfortable clothes.  Brush your teeth.  Ask your nurse before applying any prescription medications to the skin.      CHG Compatible Lotions   Aveeno Moisturizing lotion  Cetaphil Moisturizing Cream  Cetaphil Moisturizing Lotion  Clairol Herbal Essence Moisturizing Lotion, Dry Skin  Clairol Herbal Essence Moisturizing Lotion, Extra Dry Skin  Clairol Herbal Essence Moisturizing Lotion, Normal Skin  Curel Age Defying Therapeutic Moisturizing Lotion with Alpha Hydroxy  Curel Extreme Care Body Lotion  Curel Soothing Hands Moisturizing Hand Lotion  Curel Therapeutic  Moisturizing Cream, Fragrance-Free  Curel Therapeutic Moisturizing Lotion, Fragrance-Free  Curel Therapeutic Moisturizing Lotion, Original Formula  Eucerin Daily Replenishing Lotion  Eucerin Dry Skin Therapy Plus Alpha Hydroxy Crme  Eucerin Dry Skin Therapy Plus Alpha Hydroxy Lotion  Eucerin Original Crme  Eucerin Original Lotion  Eucerin Plus Crme Eucerin Plus Lotion  Eucerin TriLipid Replenishing Lotion  Keri Anti-Bacterial Hand Lotion  Keri Deep Conditioning Original Lotion Dry Skin Formula Softly Scented  Keri Deep Conditioning Original Lotion, Fragrance Free Sensitive Skin Formula  Keri Lotion Fast Absorbing Fragrance Free Sensitive Skin Formula  Keri Lotion Fast Absorbing Softly Scented Dry Skin Formula  Keri Original Lotion  Keri Skin Renewal Lotion Keri Silky Smooth Lotion  Keri Silky Smooth Sensitive Skin Lotion  Nivea Body Creamy Conditioning Oil  Nivea Body Extra Enriched Lotion  Nivea Body Original Lotion  Nivea Body Sheer Moisturizing Lotion Nivea Crme  Nivea Skin Firming Lotion  NutraDerm 30 Skin Lotion  NutraDerm Skin Lotion  NutraDerm Therapeutic Skin Cream  NutraDerm Therapeutic Skin Lotion  ProShield Protective Hand Cream  Provon moisturizing lotion     Please read over the following fact sheets that you were given.

## 2022-07-27 ENCOUNTER — Encounter (HOSPITAL_COMMUNITY): Payer: Self-pay

## 2022-07-27 ENCOUNTER — Other Ambulatory Visit: Payer: Self-pay

## 2022-07-27 ENCOUNTER — Encounter (HOSPITAL_COMMUNITY)
Admission: RE | Admit: 2022-07-27 | Discharge: 2022-07-27 | Disposition: A | Discharge status: Home / Self Care | Payer: Commercial Managed Care - PPO | Source: Ambulatory Visit | Attending: Neurological Surgery | Admitting: Neurological Surgery

## 2022-07-27 VITALS — BP 125/84 | HR 88 | Temp 98.5°F | Resp 18 | Ht 66.0 in | Wt 214.4 lb

## 2022-07-27 DIAGNOSIS — E119 Type 2 diabetes mellitus without complications: Secondary | ICD-10-CM | POA: Insufficient documentation

## 2022-07-27 DIAGNOSIS — Z01818 Encounter for other preprocedural examination: Secondary | ICD-10-CM | POA: Insufficient documentation

## 2022-07-27 HISTORY — DX: Dyspnea, unspecified: R06.00

## 2022-07-27 HISTORY — DX: Bipolar disorder, unspecified: F31.9

## 2022-07-27 LAB — CBC
HCT: 44 % (ref 36.0–46.0)
Hemoglobin: 14.4 g/dL (ref 12.0–15.0)
MCH: 29.1 pg (ref 26.0–34.0)
MCHC: 32.7 g/dL (ref 30.0–36.0)
MCV: 88.9 fL (ref 80.0–100.0)
Platelets: 360 10*3/uL (ref 150–400)
RBC: 4.95 MIL/uL (ref 3.87–5.11)
RDW: 13.6 % (ref 11.5–15.5)
WBC: 11.5 10*3/uL — ABNORMAL HIGH (ref 4.0–10.5)
nRBC: 0 % (ref 0.0–0.2)

## 2022-07-27 LAB — COMPREHENSIVE METABOLIC PANEL
ALT: 22 U/L (ref 0–44)
AST: 16 U/L (ref 15–41)
Albumin: 3.3 g/dL — ABNORMAL LOW (ref 3.5–5.0)
Alkaline Phosphatase: 127 U/L — ABNORMAL HIGH (ref 38–126)
Anion gap: 9 (ref 5–15)
BUN: 9 mg/dL (ref 6–20)
CO2: 27 mmol/L (ref 22–32)
Calcium: 9.2 mg/dL (ref 8.9–10.3)
Chloride: 105 mmol/L (ref 98–111)
Creatinine, Ser: 0.92 mg/dL (ref 0.44–1.00)
GFR, Estimated: >60 mL/min (ref >60)
Glucose, Bld: 136 mg/dL — ABNORMAL HIGH (ref 70–99)
Potassium: 3.3 mmol/L — ABNORMAL LOW (ref 3.5–5.1)
Sodium: 141 mmol/L (ref 135–145)
Total Bilirubin: 0.3 mg/dL (ref 0.3–1.2)
Total Protein: 6.7 g/dL (ref 6.5–8.1)

## 2022-07-27 LAB — SURGICAL PCR SCREEN
MRSA, PCR: NEGATIVE
Staphylococcus aureus: NEGATIVE

## 2022-07-27 LAB — TYPE AND SCREEN
ABO/RH(D): O POS
Antibody Screen: NEGATIVE

## 2022-07-27 LAB — GLUCOSE, CAPILLARY: Glucose-Capillary: 175 mg/dL — ABNORMAL HIGH (ref 70–99)

## 2022-07-27 NOTE — Progress Notes (Signed)
PCP - Dr. Lynnea Ferrier Cardiologist - Dr. Dietrich Pates (Last seen in 2021 for CP. She received echo and stress test both which were normal. PRN follow-up)  PPM/ICD - Denies Device Orders - n/a Rep Notified - n/a  Chest x-ray - n/a EKG - 07/27/2022 Stress Test - 05/23/2019 ECHO - 05/23/2019 Cardiac Cath - Denies  Sleep Study - Per pt, she was borderline +OSA in 2020 and decided to not wear CPAP.   Pt is DM2. She checks her blood sugar daily. Normal fasting range is 100-110. CBG at pre-op appointment 175. For lunch, pt had a ham sandwich, piece of cake and water  Last dose of GLP1 agonist-  n/a GLP1 instructions: n/a   Blood Thinner Instructions: n/a Aspirin Instructions: n/a  ERAS Protcol - Clear liquids until 1100 morning of surgery PRE-SURGERY Ensure or G2- n/a  COVID TEST- n/a   Anesthesia review: No.  Patient denies shortness of breath, fever, cough and chest pain at PAT appointment. Pt endorses a runny nose related to seasonal allergies, but no respiratory illness/infection in the last two months. Pt instructed to let us know if she develops additional symptoms such as cough, fever, sore throat.   All instructions explained to the patient, with a verbal understanding of the material. Patient agrees to go over the instructions while at home for a better understanding. Patient also instructed to self quarantine after being tested for COVID-19. The opportunity to ask questions was provided.

## 2022-07-28 LAB — HEMOGLOBIN A1C
Hgb A1c MFr Bld: 6.2 % — ABNORMAL HIGH (ref 4.8–5.6)
Mean Plasma Glucose: 131 mg/dL

## 2022-08-03 ENCOUNTER — Inpatient Hospital Stay (HOSPITAL_COMMUNITY)
Admission: RE | Admit: 2022-08-03 | Discharge: 2022-08-04 | DRG: 460 | Disposition: A | Discharge status: Home / Self Care | Payer: Commercial Managed Care - PPO | Attending: Neurological Surgery | Admitting: Neurological Surgery

## 2022-08-03 ENCOUNTER — Encounter (HOSPITAL_COMMUNITY)
Admission: RE | Disposition: A | Discharge status: Home / Self Care | Payer: Self-pay | Source: Home / Self Care | Attending: Neurological Surgery

## 2022-08-03 ENCOUNTER — Other Ambulatory Visit: Payer: Self-pay

## 2022-08-03 ENCOUNTER — Inpatient Hospital Stay (HOSPITAL_COMMUNITY): Payer: Commercial Managed Care - PPO

## 2022-08-03 ENCOUNTER — Encounter (HOSPITAL_COMMUNITY): Payer: Self-pay | Admitting: Neurological Surgery

## 2022-08-03 DIAGNOSIS — Z803 Family history of malignant neoplasm of breast: Secondary | ICD-10-CM

## 2022-08-03 DIAGNOSIS — Z888 Allergy status to other drugs, medicaments and biological substances status: Secondary | ICD-10-CM | POA: Diagnosis not present

## 2022-08-03 DIAGNOSIS — K219 Gastro-esophageal reflux disease without esophagitis: Secondary | ICD-10-CM | POA: Diagnosis present

## 2022-08-03 DIAGNOSIS — M4317 Spondylolisthesis, lumbosacral region: Secondary | ICD-10-CM | POA: Diagnosis present

## 2022-08-03 DIAGNOSIS — M48062 Spinal stenosis, lumbar region with neurogenic claudication: Secondary | ICD-10-CM

## 2022-08-03 DIAGNOSIS — M4316 Spondylolisthesis, lumbar region: Secondary | ICD-10-CM | POA: Diagnosis present

## 2022-08-03 DIAGNOSIS — I10 Essential (primary) hypertension: Secondary | ICD-10-CM

## 2022-08-03 DIAGNOSIS — F319 Bipolar disorder, unspecified: Secondary | ICD-10-CM | POA: Diagnosis present

## 2022-08-03 DIAGNOSIS — M4726 Other spondylosis with radiculopathy, lumbar region: Secondary | ICD-10-CM | POA: Diagnosis present

## 2022-08-03 DIAGNOSIS — G4730 Sleep apnea, unspecified: Secondary | ICD-10-CM | POA: Diagnosis present

## 2022-08-03 DIAGNOSIS — Z880 Allergy status to penicillin: Secondary | ICD-10-CM | POA: Diagnosis not present

## 2022-08-03 DIAGNOSIS — Z87891 Personal history of nicotine dependence: Secondary | ICD-10-CM

## 2022-08-03 DIAGNOSIS — Z800 Family history of malignant neoplasm of digestive organs: Secondary | ICD-10-CM

## 2022-08-03 DIAGNOSIS — Z79899 Other long term (current) drug therapy: Secondary | ICD-10-CM

## 2022-08-03 DIAGNOSIS — F419 Anxiety disorder, unspecified: Secondary | ICD-10-CM | POA: Diagnosis present

## 2022-08-03 DIAGNOSIS — Z833 Family history of diabetes mellitus: Secondary | ICD-10-CM | POA: Diagnosis not present

## 2022-08-03 DIAGNOSIS — Z8719 Personal history of other diseases of the digestive system: Secondary | ICD-10-CM | POA: Diagnosis not present

## 2022-08-03 DIAGNOSIS — Z9049 Acquired absence of other specified parts of digestive tract: Secondary | ICD-10-CM | POA: Diagnosis not present

## 2022-08-03 DIAGNOSIS — E119 Type 2 diabetes mellitus without complications: Secondary | ICD-10-CM | POA: Diagnosis present

## 2022-08-03 DIAGNOSIS — Z90711 Acquired absence of uterus with remaining cervical stump: Secondary | ICD-10-CM | POA: Diagnosis not present

## 2022-08-03 DIAGNOSIS — M5116 Intervertebral disc disorders with radiculopathy, lumbar region: Secondary | ICD-10-CM | POA: Diagnosis present

## 2022-08-03 DIAGNOSIS — Z885 Allergy status to narcotic agent status: Secondary | ICD-10-CM

## 2022-08-03 DIAGNOSIS — Z8249 Family history of ischemic heart disease and other diseases of the circulatory system: Secondary | ICD-10-CM

## 2022-08-03 DIAGNOSIS — Z7984 Long term (current) use of oral hypoglycemic drugs: Secondary | ICD-10-CM | POA: Diagnosis not present

## 2022-08-03 DIAGNOSIS — Z794 Long term (current) use of insulin: Secondary | ICD-10-CM

## 2022-08-03 DIAGNOSIS — Z981 Arthrodesis status: Secondary | ICD-10-CM

## 2022-08-03 HISTORY — PX: ANTERIOR LAT LUMBAR FUSION: SHX1168

## 2022-08-03 LAB — GLUCOSE, CAPILLARY
Glucose-Capillary: 75 mg/dL (ref 70–99)
Glucose-Capillary: 109 mg/dL — ABNORMAL HIGH (ref 70–99)
Glucose-Capillary: 206 mg/dL — ABNORMAL HIGH (ref 70–99)

## 2022-08-03 SURGERY — ANTERIOR LATERAL LUMBAR FUSION 1 LEVEL
Anesthesia: General

## 2022-08-03 MED ORDER — LIDOCAINE-EPINEPHRINE 1 %-1:100000 IJ SOLN
INTRAMUSCULAR | Status: DC | PRN
  Administered 2022-08-03: 5 mL

## 2022-08-03 MED ORDER — HYDROMORPHONE HCL 1 MG/ML IJ SOLN
INTRAMUSCULAR | Status: AC
  Filled 2022-08-03: qty 0.5

## 2022-08-03 MED ORDER — ONDANSETRON HCL 4 MG/2ML IJ SOLN
INTRAMUSCULAR | Status: AC
  Filled 2022-08-03: qty 2

## 2022-08-03 MED ORDER — MENTHOL 3 MG MT LOZG: 1.0000 | LOZENGE | OROMUCOSAL | Status: DC | PRN

## 2022-08-03 MED ORDER — METHOCARBAMOL 1000 MG/10ML IJ SOLN: 500.0000 mg | Freq: Four times a day (QID) | INTRAVENOUS | Status: DC | PRN

## 2022-08-03 MED ORDER — ROCURONIUM BROMIDE 10 MG/ML (PF) SYRINGE
PREFILLED_SYRINGE | INTRAVENOUS | Status: AC
  Filled 2022-08-03: qty 10

## 2022-08-03 MED ORDER — SODIUM CHLORIDE 0.9% FLUSH: 3.0000 mL | INTRAVENOUS | Status: DC | PRN

## 2022-08-03 MED ORDER — VANCOMYCIN HCL IN DEXTROSE 1-5 GM/200ML-% IV SOLN
1000.0000 mg | INTRAVENOUS | Status: AC
  Administered 2022-08-03: 1000 mg via INTRAVENOUS
  Filled 2022-08-03: qty 200

## 2022-08-03 MED ORDER — PHENYLEPHRINE 80 MCG/ML (10ML) SYRINGE FOR IV PUSH (FOR BLOOD PRESSURE SUPPORT)
PREFILLED_SYRINGE | INTRAVENOUS | Status: AC
  Filled 2022-08-03: qty 10

## 2022-08-03 MED ORDER — 0.9 % SODIUM CHLORIDE (POUR BTL) OPTIME
TOPICAL | Status: DC | PRN
  Administered 2022-08-03: 1000 mL

## 2022-08-03 MED ORDER — OXYCODONE HCL 5 MG PO TABS: 5.0000 mg | ORAL_TABLET | Freq: Once | ORAL | Status: DC | PRN

## 2022-08-03 MED ORDER — THROMBIN 5000 UNITS EX SOLR
OROMUCOSAL | Status: DC | PRN
  Administered 2022-08-03: 5 mL via TOPICAL

## 2022-08-03 MED ORDER — LACTATED RINGERS IV SOLN: INTRAVENOUS | Status: DC

## 2022-08-03 MED ORDER — LIDOCAINE 2% (20 MG/ML) 5 ML SYRINGE
INTRAMUSCULAR | Status: DC | PRN
  Administered 2022-08-03: 40 mg via INTRAVENOUS

## 2022-08-03 MED ORDER — AMLODIPINE BESYLATE 10 MG PO TABS
10.0000 mg | ORAL_TABLET | Freq: Every day | ORAL | Status: DC
  Administered 2022-08-03: 10 mg via ORAL
  Filled 2022-08-03 (×2): qty 1

## 2022-08-03 MED ORDER — DEXAMETHASONE SODIUM PHOSPHATE 10 MG/ML IJ SOLN
INTRAMUSCULAR | Status: DC | PRN
  Administered 2022-08-03: 5 mg via INTRAVENOUS

## 2022-08-03 MED ORDER — SCOPOLAMINE 1 MG/3DAYS TD PT72
1.0000 | MEDICATED_PATCH | TRANSDERMAL | Status: DC
  Administered 2022-08-03: 1.5 mg via TRANSDERMAL
  Filled 2022-08-03: qty 1

## 2022-08-03 MED ORDER — FENTANYL CITRATE (PF) 250 MCG/5ML IJ SOLN
INTRAMUSCULAR | Status: DC | PRN
  Administered 2022-08-03: 250 ug via INTRAVENOUS

## 2022-08-03 MED ORDER — ONDANSETRON HCL 4 MG/2ML IJ SOLN: 4.0000 mg | Freq: Four times a day (QID) | INTRAMUSCULAR | Status: DC | PRN

## 2022-08-03 MED ORDER — POLYETHYLENE GLYCOL 3350 17 G PO PACK: 17.0000 g | PACK | Freq: Every day | ORAL | Status: DC | PRN

## 2022-08-03 MED ORDER — PHENOL 1.4 % MT LIQD: 1.0000 | OROMUCOSAL | Status: DC | PRN

## 2022-08-03 MED ORDER — DOXYCYCLINE HYCLATE 50 MG PO CAPS
50.0000 mg | ORAL_CAPSULE | Freq: Every day | ORAL | Status: DC
  Administered 2022-08-04: 50 mg via ORAL
  Filled 2022-08-03: qty 1

## 2022-08-03 MED ORDER — CHLORHEXIDINE GLUCONATE CLOTH 2 % EX PADS: 6.0000 | MEDICATED_PAD | Freq: Once | CUTANEOUS | Status: DC

## 2022-08-03 MED ORDER — LINAGLIPTIN 5 MG PO TABS
5.0000 mg | ORAL_TABLET | Freq: Every day | ORAL | Status: DC
  Administered 2022-08-03 – 2022-08-04 (×2): 5 mg via ORAL
  Filled 2022-08-03 (×2): qty 1

## 2022-08-03 MED ORDER — MEPERIDINE HCL 25 MG/ML IJ SOLN: 6.2500 mg | INTRAMUSCULAR | Status: DC | PRN

## 2022-08-03 MED ORDER — KETAMINE HCL 50 MG/5ML IJ SOSY
PREFILLED_SYRINGE | INTRAMUSCULAR | Status: AC
  Filled 2022-08-03: qty 5

## 2022-08-03 MED ORDER — PROMETHAZINE HCL 25 MG/ML IJ SOLN: 6.2500 mg | INTRAMUSCULAR | Status: DC | PRN

## 2022-08-03 MED ORDER — LIDOCAINE 2% (20 MG/ML) 5 ML SYRINGE
INTRAMUSCULAR | Status: AC
  Filled 2022-08-03: qty 5

## 2022-08-03 MED ORDER — ONDANSETRON HCL 4 MG PO TABS: 4.0000 mg | ORAL_TABLET | Freq: Four times a day (QID) | ORAL | Status: DC | PRN

## 2022-08-03 MED ORDER — SODIUM CHLORIDE 0.9 % IV SOLN: 250.0000 mL | INTRAVENOUS | Status: DC

## 2022-08-03 MED ORDER — ORAL CARE MOUTH RINSE: 15.0000 mL | Freq: Once | OROMUCOSAL | Status: AC

## 2022-08-03 MED ORDER — MIDAZOLAM HCL 2 MG/2ML IJ SOLN
INTRAMUSCULAR | Status: AC
  Filled 2022-08-03: qty 2

## 2022-08-03 MED ORDER — BUPIVACAINE HCL (PF) 0.5 % IJ SOLN
INTRAMUSCULAR | Status: DC | PRN
  Administered 2022-08-03: 5 mL

## 2022-08-03 MED ORDER — HYDROMORPHONE HCL 1 MG/ML IJ SOLN
INTRAMUSCULAR | Status: DC | PRN
  Administered 2022-08-03 (×2): .5 mg via INTRAVENOUS

## 2022-08-03 MED ORDER — OXYCODONE HCL 5 MG/5ML PO SOLN: 5.0000 mg | Freq: Once | ORAL | Status: DC | PRN

## 2022-08-03 MED ORDER — SUCCINYLCHOLINE CHLORIDE 200 MG/10ML IV SOSY
PREFILLED_SYRINGE | INTRAVENOUS | Status: DC | PRN
  Administered 2022-08-03: 140 mg via INTRAVENOUS

## 2022-08-03 MED ORDER — FENTANYL CITRATE (PF) 250 MCG/5ML IJ SOLN
INTRAMUSCULAR | Status: AC
  Filled 2022-08-03: qty 5

## 2022-08-03 MED ORDER — ONDANSETRON HCL 4 MG/2ML IJ SOLN
INTRAMUSCULAR | Status: DC | PRN
  Administered 2022-08-03: 4 mg via INTRAVENOUS

## 2022-08-03 MED ORDER — INSULIN ASPART 100 UNIT/ML IJ SOLN: 0.0000 [IU] | INTRAMUSCULAR | Status: DC | PRN

## 2022-08-03 MED ORDER — MIDAZOLAM HCL 5 MG/5ML IJ SOLN
INTRAMUSCULAR | Status: DC | PRN
  Administered 2022-08-03: 2 mg via INTRAVENOUS

## 2022-08-03 MED ORDER — FLEET ENEMA 7-19 GM/118ML RE ENEM: 1.0000 | ENEMA | Freq: Once | RECTAL | Status: DC | PRN

## 2022-08-03 MED ORDER — DOXYCYCLINE (ROSACEA) 40 MG PO CPDR: 40.0000 mg | DELAYED_RELEASE_CAPSULE | Freq: Every day | ORAL | Status: DC

## 2022-08-03 MED ORDER — BISACODYL 10 MG RE SUPP: 10.0000 mg | Freq: Every day | RECTAL | Status: DC | PRN

## 2022-08-03 MED ORDER — ACETAMINOPHEN 650 MG RE SUPP: 650.0000 mg | RECTAL | Status: DC | PRN

## 2022-08-03 MED ORDER — DOCUSATE SODIUM 100 MG PO CAPS
100.0000 mg | ORAL_CAPSULE | Freq: Two times a day (BID) | ORAL | Status: DC
  Administered 2022-08-04: 100 mg via ORAL
  Filled 2022-08-03: qty 1

## 2022-08-03 MED ORDER — SODIUM CHLORIDE 0.9% FLUSH
3.0000 mL | Freq: Two times a day (BID) | INTRAVENOUS | Status: DC
  Administered 2022-08-03: 3 mL via INTRAVENOUS

## 2022-08-03 MED ORDER — FENTANYL CITRATE (PF) 100 MCG/2ML IJ SOLN
25.0000 ug | INTRAMUSCULAR | Status: DC | PRN
  Administered 2022-08-03 (×2): 50 ug via INTRAVENOUS

## 2022-08-03 MED ORDER — ZONISAMIDE 100 MG PO CAPS
200.0000 mg | ORAL_CAPSULE | Freq: Every day | ORAL | Status: DC
  Administered 2022-08-03: 200 mg via ORAL
  Filled 2022-08-03 (×2): qty 2

## 2022-08-03 MED ORDER — ACETAMINOPHEN 500 MG PO TABS
1000.0000 mg | ORAL_TABLET | Freq: Once | ORAL | Status: AC
  Administered 2022-08-03: 1000 mg via ORAL
  Filled 2022-08-03: qty 2

## 2022-08-03 MED ORDER — OXYCODONE HCL 5 MG/5ML PO SOLN
ORAL | Status: AC
  Filled 2022-08-03: qty 5

## 2022-08-03 MED ORDER — FENTANYL CITRATE (PF) 100 MCG/2ML IJ SOLN
INTRAMUSCULAR | Status: AC
  Filled 2022-08-03: qty 2

## 2022-08-03 MED ORDER — ACETAMINOPHEN 325 MG PO TABS: 650.0000 mg | ORAL_TABLET | ORAL | Status: DC | PRN

## 2022-08-03 MED ORDER — DEXAMETHASONE SODIUM PHOSPHATE 10 MG/ML IJ SOLN
INTRAMUSCULAR | Status: AC
  Filled 2022-08-03: qty 1

## 2022-08-03 MED ORDER — MORPHINE SULFATE (PF) 2 MG/ML IV SOLN
2.0000 mg | INTRAVENOUS | Status: DC | PRN
  Administered 2022-08-03: 2 mg via INTRAVENOUS
  Filled 2022-08-03: qty 1

## 2022-08-03 MED ORDER — PHENYLEPHRINE HCL-NACL 20-0.9 MG/250ML-% IV SOLN
INTRAVENOUS | Status: DC | PRN
  Administered 2022-08-03: 30 ug/min via INTRAVENOUS
  Administered 2022-08-03 (×2): 80 ug via INTRAVENOUS

## 2022-08-03 MED ORDER — LIDOCAINE-EPINEPHRINE 1 %-1:100000 IJ SOLN
INTRAMUSCULAR | Status: AC
  Filled 2022-08-03: qty 1

## 2022-08-03 MED ORDER — FLUTICASONE PROPIONATE 50 MCG/ACT NA SUSP: 1.0000 | Freq: Two times a day (BID) | NASAL | Status: DC

## 2022-08-03 MED ORDER — ONDANSETRON HCL 4 MG PO TABS: 4.0000 mg | ORAL_TABLET | Freq: Three times a day (TID) | ORAL | Status: DC | PRN

## 2022-08-03 MED ORDER — METHOCARBAMOL 500 MG PO TABS
500.0000 mg | ORAL_TABLET | Freq: Four times a day (QID) | ORAL | Status: DC | PRN
  Administered 2022-08-03 – 2022-08-04 (×2): 500 mg via ORAL
  Filled 2022-08-03 (×2): qty 1

## 2022-08-03 MED ORDER — LACTATED RINGERS IV SOLN: INTRAVENOUS | Status: DC | PRN

## 2022-08-03 MED ORDER — PROPOFOL 10 MG/ML IV BOLUS
INTRAVENOUS | Status: DC | PRN
  Administered 2022-08-03: 140 mg via INTRAVENOUS

## 2022-08-03 MED ORDER — CEFAZOLIN SODIUM-DEXTROSE 2-4 GM/100ML-% IV SOLN
2.0000 g | Freq: Three times a day (TID) | INTRAVENOUS | Status: AC
  Administered 2022-08-03 – 2022-08-04 (×2): 2 g via INTRAVENOUS
  Filled 2022-08-03 (×2): qty 100

## 2022-08-03 MED ORDER — PROPOFOL 500 MG/50ML IV EMUL
INTRAVENOUS | Status: DC | PRN
  Administered 2022-08-03: 100 ug/kg/min via INTRAVENOUS

## 2022-08-03 MED ORDER — BUPIVACAINE HCL (PF) 0.5 % IJ SOLN
INTRAMUSCULAR | Status: AC
  Filled 2022-08-03: qty 30

## 2022-08-03 MED ORDER — SENNA 8.6 MG PO TABS
1.0000 | ORAL_TABLET | Freq: Two times a day (BID) | ORAL | Status: DC
  Administered 2022-08-04: 8.6 mg via ORAL
  Filled 2022-08-03: qty 1

## 2022-08-03 MED ORDER — VENLAFAXINE HCL ER 75 MG PO CP24
300.0000 mg | ORAL_CAPSULE | Freq: Every day | ORAL | Status: DC
  Administered 2022-08-04: 300 mg via ORAL
  Filled 2022-08-03: qty 4

## 2022-08-03 MED ORDER — MIDAZOLAM HCL 2 MG/2ML IJ SOLN: 0.5000 mg | Freq: Once | INTRAMUSCULAR | Status: DC | PRN

## 2022-08-03 MED ORDER — PROPOFOL 10 MG/ML IV BOLUS
INTRAVENOUS | Status: AC
  Filled 2022-08-03: qty 20

## 2022-08-03 MED ORDER — THROMBIN 5000 UNITS EX SOLR
CUTANEOUS | Status: AC
  Filled 2022-08-03: qty 5000

## 2022-08-03 MED ORDER — HYDROCHLOROTHIAZIDE 25 MG PO TABS
25.0000 mg | ORAL_TABLET | Freq: Every day | ORAL | Status: DC
  Administered 2022-08-03: 25 mg via ORAL
  Filled 2022-08-03 (×2): qty 1

## 2022-08-03 MED ORDER — CHLORHEXIDINE GLUCONATE 0.12 % MT SOLN
15.0000 mL | Freq: Once | OROMUCOSAL | Status: AC
  Administered 2022-08-03: 15 mL via OROMUCOSAL
  Filled 2022-08-03: qty 15

## 2022-08-03 MED ORDER — ALUM & MAG HYDROXIDE-SIMETH 200-200-20 MG/5ML PO SUSP: 30.0000 mL | Freq: Four times a day (QID) | ORAL | Status: DC | PRN

## 2022-08-03 MED ORDER — HYDROCODONE-ACETAMINOPHEN 5-325 MG PO TABS
1.0000 | ORAL_TABLET | ORAL | Status: DC | PRN
  Administered 2022-08-03 – 2022-08-04 (×4): 1 via ORAL
  Filled 2022-08-03 (×4): qty 1

## 2022-08-03 SURGICAL SUPPLY — 50 items
ADH SKN CLS APL DERMABOND .7 (GAUZE/BANDAGES/DRESSINGS) ×1
BAG COUNTER SPONGE SURGICOUNT (BAG) ×1 IMPLANT
BAG SPNG CNTER NS LX DISP (BAG) ×2
BLADE CLIPPER SURG (BLADE) IMPLANT
BOLT 5.0X45 SPINAL (Bolt) IMPLANT
BOLT DECADE 5.5X40 (Bolt) IMPLANT
BOLT PLATE DECADE 5.0X40M (Bolt) IMPLANT
BOLT SPNL LRG 45X5.5XPLAT NS (Screw) IMPLANT
BONE MATRIX OSTEOCEL PRO LRG (Bone Implant) IMPLANT
CAGE MODULUS XLW 12X22X45 - 10 (Cage) IMPLANT
CLIP NEUROVISION LG (NEUROSURGERY SUPPLIES) IMPLANT
DERMABOND ADVANCED .7 DNX12 (GAUZE/BANDAGES/DRESSINGS) ×1 IMPLANT
DRAPE C-ARM 42X72 X-RAY (DRAPES) ×1 IMPLANT
DRAPE C-ARMOR (DRAPES) ×1 IMPLANT
DRAPE LAPAROTOMY 100X72X124 (DRAPES) ×1 IMPLANT
DURAPREP 26ML APPLICATOR (WOUND CARE) ×1 IMPLANT
ELECT REM PT RETURN 9FT ADLT (ELECTROSURGICAL) ×1
ELECTRODE REM PT RTRN 9FT ADLT (ELECTROSURGICAL) ×1 IMPLANT
GAUZE 4X4 16PLY ~~LOC~~+RFID DBL (SPONGE) IMPLANT
GLOVE BIOGEL PI IND STRL 8.5 (GLOVE) ×1 IMPLANT
GLOVE ECLIPSE 8.5 STRL (GLOVE) ×1 IMPLANT
GLOVE EXAM NITRILE XL STR (GLOVE) IMPLANT
GOWN STRL REUS W/ TWL LRG LVL3 (GOWN DISPOSABLE) IMPLANT
GOWN STRL REUS W/ TWL XL LVL3 (GOWN DISPOSABLE) ×1 IMPLANT
GOWN STRL REUS W/TWL 2XL LVL3 (GOWN DISPOSABLE) ×1 IMPLANT
GOWN STRL REUS W/TWL LRG LVL3 (GOWN DISPOSABLE)
GOWN STRL REUS W/TWL XL LVL3 (GOWN DISPOSABLE) ×1
HEMOSTAT POWDER KIT SURGIFOAM (HEMOSTASIS) IMPLANT
KIT BASIN OR (CUSTOM PROCEDURE TRAY) ×1 IMPLANT
KIT DILATOR XLIF 5 (KITS) IMPLANT
KIT SURGICAL ACCESS MAXCESS 4 (KITS) IMPLANT
KIT TURNOVER KIT B (KITS) ×1 IMPLANT
MODULE NVM5 NEXT GEN EMG (NEUROSURGERY SUPPLIES) IMPLANT
NDL HYPO 25X1 1.5 SAFETY (NEEDLE) ×1 IMPLANT
NEEDLE HYPO 25X1 1.5 SAFETY (NEEDLE) ×1 IMPLANT
NS IRRIG 1000ML POUR BTL (IV SOLUTION) ×1 IMPLANT
PACK LAMINECTOMY NEURO (CUSTOM PROCEDURE TRAY) ×1 IMPLANT
PLATE DECADE 4HOLE SZ12 XLIF (Plate) IMPLANT
PROBE BALL TIP NVM5 SNG USE (NEUROSURGERY SUPPLIES) IMPLANT
SCREW 45MM (Screw) ×1 IMPLANT
SOL ELECTROSURG ANTI STICK (MISCELLANEOUS)
SOLUTION ELECTROSURG ANTI STCK (MISCELLANEOUS) ×1 IMPLANT
SPONGE T-LAP 4X18 ~~LOC~~+RFID (SPONGE) IMPLANT
SUT VIC AB 3-0 SH 8-18 (SUTURE) ×1 IMPLANT
SUT VIC AB 4-0 RB1 18 (SUTURE) ×1 IMPLANT
TAPE CLOTH 4X10 WHT NS (GAUZE/BANDAGES/DRESSINGS) ×2 IMPLANT
TOWEL GREEN STERILE (TOWEL DISPOSABLE) ×1 IMPLANT
TOWEL GREEN STERILE FF (TOWEL DISPOSABLE) ×1 IMPLANT
TRAY FOLEY MTR SLVR 16FR STAT (SET/KITS/TRAYS/PACK) ×1 IMPLANT
WATER STERILE IRR 1000ML POUR (IV SOLUTION) ×1 IMPLANT

## 2022-08-03 NOTE — Op Note (Signed)
Date of surgery: 08/03/2022 Preoperative diagnosis: Myelosis with stenosis L2-L3 lumbar radiculopathy, neurogenic claudication.  History of fusion L3 to sacrum Postoperative diagnosis: Same Procedure: Anterolateral decompression L2-L3 and interbody arthrodesis using titanium spacer and allograft with XLIF technique.  Lateral plate fixation Z6-X0 with fluoroscopic guidance EMG neuromonitoring. Surgeon: Barnett Abu Anesthesia: General tracheal Indications: [name] is a 57 year old female who has had progressively worsening back pain and lumbar radiculopathy in addition to symptoms of neurogenic claudication.  She has had previous decompression and fusion from L3 to the sacrum with a process that initially started at L3-4 followed by surgical spondylolisthesis at L5-S1 which required fusion and finally L4-5 which had degenerative changes.  She now has advanced to disease to the level of L2-L3.  Has been advised regarding an anterolateral decompression and arthrodesis using an XLIF technique.  Procedure: Patient was brought to the operating room supine on the stretcher.  After the smooth induction of general endotracheal anesthesia, she was carefully turned into the right lateral decubitus position.  Fluoroscopy scopic imaging was obtained to verify it orthogonal at the end to mark the skin on the lateral aspect for an entry site directly over the L2-L3 interspace.  Once she was secured in position with tape and the table was broken in order to open up the L4-L3 space the skin was prepped with alcohol DuraPrep and draped in a sterile fashion.  A vertical incision was created in the area chosen and the dissection was carried down through the superficial subcutaneous fat.  A second incision was created posteriorly to penetrate the retroperitoneal space posteriorly.  This then allowed placement of an initial guide to through the retroperitoneum into the psoas muscle overlying the L2-3 space.  Positioning of this  initial probe was checked and verified with fluoroscopy and then stimulation was performed make sure that no elements of the lumbar plexus where being irritated by the probe.  A K wire was then placed into the L2-3 disc space to secure the probe.  A series of dilators was then passed to enlarge this opening to an 18 mm diameter.  Each of the dilators was stimulated electrically to see if there was an EMG activity in any of the lumbar plexus nerve roots.  None was noted.  Then a 150 mm deep retractor was placed over the 18 mm probe and this was gradually opened.  The area could then be directly inspected and the area near the posterior blade of the 3 bladed retractor was stimulated to allow placement of a shim into the disc space at L2-L3.  When no neural activity was noted the Chane was placed and checked radiographically.  The retractor was then opened and some of the soft tissue overlying the lateral aspect of the disc base at L2-L3 was cleared.  A 15 blade was then used to open the disc space.  A substantial quantity of severely degenerated and desiccated disc material was removed from the L2-L3 interspace.  Once that the space was provisionally opened and decompressed a sizing spacer was used and was felt ultimately that after several dilations and further resection of disc material from within the interspace a 12 mm tall spacer measuring 22 mm anterior posterior dimension and 45 mm in lateral dimension with 10 degrees of lordosis fit best into this interval.  The endplates were prepared by being decorticated and is much of the disc in this area that could be removed was removed to clean the interspace.  Then the final titanium  spacer filled with Osteocel allograft was placed into the interspace.  This was confirmed radiographically.  During this time EMG sounds was noted.  Once the spacer was placed and its positioning was checked the lateral aspect of the vertebral bodies was secured with a 12 mm tall lateral  plate.  The plate had 5.0 x 40 mm screws placed posteriorly at L3 5.5 x 40 mm screw placed anteriorly at L3 and 5.5 x 45 mm screw placed anteriorly at L2 and 5.0 x 40 mm placed posteriorly at L2.  The plate was tightened in a neutral construct.  Final radiographs were obtained in the AP and lateral projection showed good alignment of the vertebrae and good distraction of the disc space.  With this the retractors were removed the area was carefully inspected for hemostasis and then the deep fascia was closed with 3-0 Vicryl in interrupted fashion and 3-0 Vicryl was used in the subcuticular skin on the lateral and posterior incisions.  Dermabond was applied over the incisions.  Patient was then returned to the supine position awakened and returned to the recovery room in stable condition.  Blood loss for the procedure was estimated at less than 50 cc

## 2022-08-03 NOTE — Anesthesia Preprocedure Evaluation (Addendum)
Anesthesia Evaluation  Patient identified by MRN, date of birth, ID band Patient awake    Reviewed: Allergy & Precautions, NPO status , Patient's Chart, lab work & pertinent test results  History of Anesthesia Complications Negative for: history of anesthetic complications  Airway Mallampati: III  TM Distance: >3 FB Neck ROM: Full    Dental  (+) Dental Advisory Given   Pulmonary sleep apnea (does not use CPAP) , former smoker   breath sounds clear to auscultation       Cardiovascular hypertension, Pt. on medications (-) angina  Rhythm:Regular Rate:Normal     Neuro/Psych  Headaches, Seizures -,   Anxiety Depression Bipolar Disorder   H/o SAH    GI/Hepatic Neg liver ROS,GERD  Controlled,,  Endo/Other  diabetes (glu 75), Insulin Dependent  BMI 34.5 januvia  Renal/GU negative Renal ROS     Musculoskeletal   Abdominal   Peds  Hematology negative hematology ROS (+) Hb 14.4, plt 360k   Anesthesia Other Findings   Reproductive/Obstetrics                             Anesthesia Physical Anesthesia Plan  ASA: 3  Anesthesia Plan: General   Post-op Pain Management: Tylenol PO (pre-op)*   Induction: Intravenous  PONV Risk Score and Plan: 3 and Ondansetron, Dexamethasone and Scopolamine patch - Pre-op  Airway Management Planned: Oral ETT  Additional Equipment: None  Intra-op Plan:   Post-operative Plan: Extubation in OR  Informed Consent: I have reviewed the patients History and Physical, chart, labs and discussed the procedure including the risks, benefits and alternatives for the proposed anesthesia with the patient or authorized representative who has indicated his/her understanding and acceptance.     Dental advisory given  Plan Discussed with: CRNA and Surgeon  Anesthesia Plan Comments:        Anesthesia Quick Evaluation

## 2022-08-03 NOTE — Transfer of Care (Signed)
Immediate Anesthesia Transfer of Care Note  Patient: [name]  Procedure(s) Performed: Lumbar two-three extreme Lateral Interbody Fusion  Patient Location: PACU  Anesthesia Type:General  Level of Consciousness: awake, drowsy, patient cooperative, and responds to stimulation  Airway & Oxygen Therapy: Patient Spontanous Breathing and Patient connected to nasal cannula oxygen  Post-op Assessment: Report given to RN, Post -op Vital signs reviewed and stable, Patient moving all extremities X 4, and Patient able to stick tongue midline  Post vital signs: Reviewed  Last Vitals:  Vitals Value Taken Time  BP 97/66   Temp 97.8   Pulse 92 08/03/22 1651  Resp 17 08/03/22 1651  SpO2 96 % 08/03/22 1651  Vitals shown include unvalidated device data.  Last Pain:  Vitals:   08/03/22 1322  TempSrc:   PainSc: 7          Complications: There were no known notable events for this encounter.

## 2022-08-03 NOTE — Anesthesia Procedure Notes (Addendum)
Procedure Name: Intubation Date/Time: 08/03/2022 2:41 PM  Performed by: Cy Blamer, CRNAPre-anesthesia Checklist: Patient identified, Emergency Drugs available, Suction available and Patient being monitored Patient Re-evaluated:Patient Re-evaluated prior to induction Oxygen Delivery Method: Circle system utilized Preoxygenation: Pre-oxygenation with 100% oxygen Induction Type: IV induction Ventilation: Mask ventilation without difficulty and Oral airway inserted - appropriate to patient size Laryngoscope Size: Mac and 3 Grade View: Grade I Tube type: Oral Tube size: 7.0 mm Number of attempts: 1 Airway Equipment and Method: Stylet, Oral airway and Bite block Placement Confirmation: ETT inserted through vocal cords under direct vision, positive ETCO2 and breath sounds checked- equal and bilateral Secured at: 21 cm Tube secured with: Tape Dental Injury: Teeth and Oropharynx as per pre-operative assessment  Comments: Preformed by Charlann Noss, SRNA - CRNA and MDA present for procedure

## 2022-08-03 NOTE — Anesthesia Postprocedure Evaluation (Signed)
Anesthesia Post Note  Patient: [name]  Procedure(s) Performed: Lumbar two-three extreme Lateral Interbody Fusion     Patient location during evaluation: PACU Anesthesia Type: General Level of consciousness: awake and alert, patient cooperative and oriented Pain management: pain level controlled Vital Signs Assessment: post-procedure vital signs reviewed and stable Respiratory status: spontaneous breathing, nonlabored ventilation, respiratory function stable and patient connected to nasal cannula oxygen Cardiovascular status: blood pressure returned to baseline and stable Postop Assessment: no apparent nausea or vomiting Anesthetic complications: no   There were no known notable events for this encounter.  Last Vitals:  Vitals:   08/03/22 1730 08/03/22 1745  BP: 106/77 110/70  Pulse: 90 95  Resp: 13 15  Temp:    SpO2: 92% 96%    Last Pain:  Vitals:   08/03/22 1730  TempSrc:   PainSc: 7                  Ashlley Booher,E. Elysia Grand

## 2022-08-03 NOTE — H&P (Signed)
[name] is an 57 y.o. female.   Chief Complaint: Back and bilateral lower extremity pain HPI: [name] is a 57 year old individual who has had significant problems with degenerative disc disease in the back in the past.  She has had initial fusion at L3-4 a subsequent fusion at L5-S1 and more recently decompression fusion for spondylolisthesis at L4-L5.  She has done well after each of those surgeries however in the past year she has developed significant radicular pathic symptoms in the anterior thighs and the lower back with back pain radiating into the glutei.  Myelogram was recently completed which demonstrates that she is advancing some disc degenerative changes at L2-L3 above her arthrodesis there is some broad-based disc bulging noted.  She has mild facet hypertrophy but no significant lateral recess stenosis.  Having reviewed all these findings and failed conservative therapy with physical therapy and a number of injections I have advised surgical decompression arthrodesis using an anterolateral approach at the L2-L3 level with lateral plating.  She is now admitted for that procedure.  Past Medical History:  Diagnosis Date   Anxiety    Bipolar disorder (HCC)    Depression    Diabetes mellitus without complication (HCC)    Type 2   Dyspnea    r/t anxiety   Encounter for neuropsychological testing    at [facility] 3/20-suggest possible somatization   Headache    Hypertension    Migraines    Pseudoseizure    Last seizure March/April 2024   PTSD (post-traumatic stress disorder)    Rosacea    Seizures (HCC)    [facility] (Dr. 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 1990s   APPENDECTOMY     BACK SURGERY     x2   CHOLECYSTECTOMY     COLONOSCOPY WITH PROPOFOL N/A 07/20/2021    Surgeon: Corbin Ade, MD;  Three 6-8 mm polyps in the descending colon and cecum removed, diverticulosis in the entire colon, otherwise normal exam.  Pathology with tubular adenomas.  Recommended 5-year surveillance.   CYST EXCISION     on ovaries   lumbar     ruptured disc in lumbar taken out   POLYPECTOMY  07/20/2021   Procedure: POLYPECTOMY;  Surgeon: Corbin Ade, MD;  Location: [facility];  Service: Endoscopy;;   ROTATOR CUFF REPAIR Left    TONSILLECTOMY     TUBAL LIGATION      Family History  Problem Relation Age of Onset   Diabetes Father    Heart disease Father 64   Hypertension Father    Breast cancer Paternal Aunt        ? age of dx   Cancer Maternal Grandfather        colon cancer (70's)   Diabetes Paternal Grandmother    Diabetes Paternal Grandfather    Cancer Cousin        breast   Breast cancer Cousin    Breast cancer Cousin        paternal   Breast cancer Cousin    Liver cancer Neg Hx    Social History:  reports that she quit smoking about 31 years ago. Her smoking use included cigarettes. She has a 5.00 pack-year smoking history. She has never used smokeless tobacco. She reports current alcohol  use. She reports that she does not use drugs.  Allergies:  Allergies  Allergen Reactions   Penicillins Hives    Has patient had a PCN reaction causing immediate rash, facial/tongue/throat swelling, SOB or lightheadedness with hypotension: Yes Has patient had a PCN reaction causing severe rash involving mucus membranes or skin necrosis: No Has patient had a PCN reaction that required hospitalization No Has patient had a PCN reaction occurring within the last 10 years: No If all of the above answers are "NO", then may proceed with Cephalosporin use.     Toradol [Ketorolac Tromethamine] Hives   Hydromorphone     Other reaction(s): Hallucination   Lamotrigine Rash    Medications Prior to Admission  Medication Sig Dispense Refill   amLODipine (NORVASC) 5  MG tablet Take 10 mg by mouth daily.     buPROPion (WELLBUTRIN XL) 150 MG 24 hr tablet TAKE 1 TABLET BY MOUTH EVERY DAY 90 tablet 2   hydrochlorothiazide (HYDRODIURIL) 25 MG tablet Take 1 tablet (25 mg total) by mouth daily. 90 tablet 3   HYDROcodone-acetaminophen (NORCO/VICODIN) 5-325 MG tablet Take 1 tablet by mouth every 4 (four) hours as needed. (Patient taking differently: Take 1 tablet by mouth every 6 (six) hours as needed for moderate pain or severe pain.) 15 tablet 0   ondansetron (ZOFRAN) 4 MG tablet TAKE 1 TABLET BY MOUTH EVERY 8 HOURS AS NEEDED FOR NAUSEA AND VOMITING 20 tablet 1   ORACEA 40 MG CPDR Take 40 mg by mouth daily.     sitaGLIPtin (JANUVIA) 100 MG tablet Take 1 tablet (100 mg total) by mouth daily. 90 tablet 1   traMADol (ULTRAM) 50 MG tablet Take 50 mg by mouth every 6 (six) hours as needed for moderate pain or severe pain.     venlafaxine XR (EFFEXOR-XR) 150 MG 24 hr capsule Take 2 capsules (300 mg total) by mouth daily with breakfast. 180 capsule 1   zonisamide (ZONEGRAN) 100 MG capsule Take 200mg  daily at bedtime 180 capsule 3   ACCU-CHEK GUIDE test strip USE AS DIRECTED TO MONITOR FSBS 1X DAILY. DX: R73.09. 100 strip 11   Blood Glucose Monitoring Suppl (BLOOD GLUCOSE SYSTEM PAK) KIT Please dispense based on patient and insurance preference. Use as directed to monitor FSBS 1x daily. Dx: R73.09. 1 kit 1   clotrimazole-betamethasone (LOTRISONE) cream Apply 1 Application topically 2 (two) times daily. (Patient not taking: Reported on 07/25/2022) 30 g 0   fluticasone (FLONASE) 50 MCG/ACT nasal spray PLACE 1 SPRAY INTO BOTH NOSTRILS 2 (TWO) TIMES DAILY (Patient not taking: Reported on 07/25/2022) 16 mL 11   Insulin Pen Needle (BD PEN NEEDLE NANO 2ND GEN) 32G X 4 MM MISC Use to inject Victoza into the skin daily. 90 each 1   Lancets MISC Please dispense based on patient and insurance preference. Use as directed to monitor FSBS 1x daily. Dx: R73.09. 50 each 0    Results for orders  placed or performed during the hospital encounter of 08/03/22 (from the past 48 hour(s))  Glucose, capillary     Status: None   Collection Time: 08/03/22 12:32 PM  Result Value Ref Range   Glucose-Capillary 75 70 - 99 mg/dL    Comment: Glucose reference range applies only to samples taken after fasting for at least 8 hours.   No results found.  Review of Systems  Constitutional:  Positive for activity change.  HENT: Negative.    Eyes: Negative.   Respiratory: Negative.    Cardiovascular: Negative.  Gastrointestinal: Negative.   Endocrine: Negative.   Genitourinary: Negative.   Musculoskeletal:  Positive for back pain and myalgias.  Allergic/Immunologic: Negative.   Neurological:  Positive for weakness and numbness.  Hematological: Negative.   Psychiatric/Behavioral: Negative.      Blood pressure 116/76, pulse 83, temperature 98.1 F (36.7 C), temperature source Oral, resp. rate 18, height 5\' 6"  (1.676 m), weight 97.1 kg, SpO2 96 %. Physical Exam Constitutional:      Appearance: Normal appearance. She is obese.  HENT:     Head: Normocephalic and atraumatic.     Right Ear: Tympanic membrane, ear canal and external ear normal.     Left Ear: Tympanic membrane, ear canal and external ear normal.     Nose: Nose normal.     Mouth/Throat:     Mouth: Mucous membranes are moist.     Pharynx: Oropharynx is clear.  Eyes:     Extraocular Movements: Extraocular movements intact.     Conjunctiva/sclera: Conjunctivae normal.     Pupils: Pupils are equal, round, and reactive to light.  Cardiovascular:     Rate and Rhythm: Normal rate and regular rhythm.     Pulses: Normal pulses.     Heart sounds: Normal heart sounds.  Pulmonary:     Effort: Pulmonary effort is normal.     Breath sounds: Normal breath sounds.  Abdominal:     General: Abdomen is flat.     Palpations: Abdomen is soft.  Musculoskeletal:        General: Normal range of motion.     Cervical back: Normal range of  motion and neck supple.  Skin:    General: Skin is warm and dry.     Capillary Refill: Capillary refill takes less than 2 seconds.  Neurological:     General: No focal deficit present.     Mental Status: She is alert.  Psychiatric:        Mood and Affect: Mood normal.        Behavior: Behavior normal.        Thought Content: Thought content normal.        Judgment: Judgment normal.      Assessment/Plan Degenerative disc disease L2-L3 with lumbar radiculopathy.  Anterolateral decompression L2-L3 with XLIF technique and lateral plate fixation.  Stefani Dama, MD 08/03/2022, 2:09 PM

## 2022-08-03 NOTE — Progress Notes (Signed)
Orthopedic Tech Progress Note Patient Details:  [name] Apr 07, 1965 [id number]  Ortho Devices Type of Ortho Device: Lumbar corsett Ortho Device/Splint Location: adjusted, in pt room for use Ortho Device/Splint Interventions: Ordered, Adjustment   Post Interventions Instructions Provided: Care of device, Adjustment of device  Docia Furl 08/03/2022, 8:03 PM

## 2022-08-03 NOTE — Progress Notes (Signed)
Patient ID: [name], female   DOB: Feb 05, 1966, 57 y.o.   MRN: [id number] Patient is waking up nicely moving off extremities well comfortable in regards to back.

## 2022-08-04 ENCOUNTER — Encounter (HOSPITAL_COMMUNITY): Payer: Self-pay | Admitting: Neurological Surgery

## 2022-08-04 LAB — GLUCOSE, CAPILLARY
Glucose-Capillary: 156 mg/dL — ABNORMAL HIGH (ref 70–99)
Glucose-Capillary: 113 mg/dL — ABNORMAL HIGH (ref 70–99)

## 2022-08-04 MED ORDER — HYDROCODONE-ACETAMINOPHEN 5-325 MG PO TABS
1.0000 | ORAL_TABLET | ORAL | Status: DC | PRN
  Administered 2022-08-04: 2 via ORAL
  Filled 2022-08-04: qty 2

## 2022-08-04 MED ORDER — HYDROCODONE-ACETAMINOPHEN 10-325 MG PO TABS: 1.0000 | ORAL_TABLET | ORAL | 0 refills | Status: DC | PRN

## 2022-08-04 NOTE — Plan of Care (Signed)
  Problem: Education: Goal: Ability to verbalize activity precautions or restrictions will improve Outcome: Completed/Met Goal: Knowledge of the prescribed therapeutic regimen will improve Outcome: Completed/Met Goal: Understanding of discharge needs will improve Outcome: Completed/Met   Problem: Activity: Goal: Ability to avoid complications of mobility impairment will improve Outcome: Completed/Met Goal: Ability to tolerate increased activity will improve Outcome: Completed/Met Goal: Will remain free from falls Outcome: Completed/Met   Problem: Bowel/Gastric: Goal: Gastrointestinal status for postoperative course will improve Outcome: Completed/Met   Problem: Clinical Measurements: Goal: Ability to maintain clinical measurements within normal limits will improve Outcome: Completed/Met Goal: Postoperative complications will be avoided or minimized Outcome: Completed/Met Goal: Diagnostic test results will improve Outcome: Completed/Met   Problem: Pain Management: Goal: Pain level will decrease Outcome: Completed/Met   Problem: Health Behavior/Discharge Planning: Goal: Identification of resources available to assist in meeting health care needs will improve Outcome: Completed/Met   Problem: Bladder/Genitourinary: Goal: Urinary functional status for postoperative course will improve Outcome: Completed/Met Patient alert and oriented, void, ambulate. Surgical site clean and dry. D/c instructions explain and given all questions answered. Patient d/c home per order.

## 2022-08-04 NOTE — Plan of Care (Signed)
Problem: Education: Goal: Knowledge of General Education information will improve Description: Including pain rating scale, medication(s)/side effects and non-pharmacologic comfort measures 08/04/2022 0116 by Yvone Neu, RN Outcome: Progressing 08/04/2022 0115 by Yvone Neu, RN Outcome: Progressing   Problem: Health Behavior/Discharge Planning: Goal: Ability to manage health-related needs will improve 08/04/2022 0116 by Yvone Neu, RN Outcome: Progressing 08/04/2022 0115 by Yvone Neu, RN Outcome: Progressing   Problem: Clinical Measurements: Goal: Ability to maintain clinical measurements within normal limits will improve 08/04/2022 0116 by Yvone Neu, RN Outcome: Progressing 08/04/2022 0115 by Yvone Neu, RN Outcome: Progressing Goal: Will remain free from infection 08/04/2022 0116 by Yvone Neu, RN Outcome: Progressing 08/04/2022 0115 by Yvone Neu, RN Outcome: Progressing Goal: Diagnostic test results will improve 08/04/2022 0116 by Yvone Neu, RN Outcome: Progressing 08/04/2022 0115 by Yvone Neu, RN Outcome: Progressing Goal: Respiratory complications will improve 08/04/2022 0116 by Yvone Neu, RN Outcome: Progressing 08/04/2022 0115 by Yvone Neu, RN Outcome: Progressing Goal: Cardiovascular complication will be avoided 08/04/2022 0116 by Yvone Neu, RN Outcome: Progressing 08/04/2022 0115 by Yvone Neu, RN Outcome: Progressing   Problem: Activity: Goal: Risk for activity intolerance will decrease 08/04/2022 0116 by Yvone Neu, RN Outcome: Progressing 08/04/2022 0115 by Yvone Neu, RN Outcome: Progressing   Problem: Nutrition: Goal: Adequate nutrition will be maintained 08/04/2022 0116 by Yvone Neu, RN Outcome: Progressing 08/04/2022 0115 by Yvone Neu, RN Outcome: Progressing   Problem: Coping: Goal: Level of anxiety will decrease 08/04/2022 0116 by Yvone Neu, RN Outcome: Progressing 08/04/2022 0115 by Yvone Neu, RN Outcome: Progressing   Problem: Elimination: Goal: Will not experience complications related to bowel motility 08/04/2022 0116 by Yvone Neu, RN Outcome: Progressing 08/04/2022 0115 by Yvone Neu, RN Outcome: Progressing Goal: Will not experience complications related to urinary retention 08/04/2022 0116 by Yvone Neu, RN Outcome: Progressing 08/04/2022 0115 by Yvone Neu, RN Outcome: Progressing   Problem: Pain Managment: Goal: General experience of comfort will improve 08/04/2022 0116 by Yvone Neu, RN Outcome: Progressing 08/04/2022 0115 by Yvone Neu, RN Outcome: Progressing   Problem: Safety: Goal: Ability to remain free from injury will improve 08/04/2022 0116 by Yvone Neu, RN Outcome: Progressing 08/04/2022 0115 by Yvone Neu, RN Outcome: Progressing   Problem: Skin Integrity: Goal: Risk for impaired skin integrity will decrease 08/04/2022 0116 by Yvone Neu, RN Outcome: Progressing 08/04/2022 0115 by Yvone Neu, RN Outcome: Progressing   Problem: Education: Goal: Ability to verbalize activity precautions or restrictions will improve 08/04/2022 0116 by Yvone Neu, RN Outcome: Progressing 08/04/2022 0115 by Yvone Neu, RN Outcome: Progressing Goal: Knowledge of the prescribed therapeutic regimen will improve 08/04/2022 0116 by Yvone Neu, RN Outcome: Progressing 08/04/2022 0115 by Yvone Neu, RN Outcome: Progressing Goal: Understanding of discharge needs will improve 08/04/2022 0116 by Yvone Neu, RN Outcome: Progressing 08/04/2022 0115 by Yvone Neu, RN Outcome: Progressing   Problem: Activity: Goal: Ability to avoid complications of mobility impairment will improve 08/04/2022 0116 by Yvone Neu, RN Outcome: Progressing 08/04/2022 0115 by Yvone Neu, RN Outcome: Progressing Goal: Ability to  tolerate increased activity will improve 08/04/2022 0116 by Yvone Neu, RN Outcome: Progressing 08/04/2022 0115 by Yvone Neu, RN Outcome: Progressing Goal: Will remain free from falls 08/04/2022 0116 by Yvone Neu, RN Outcome: Progressing 08/04/2022 0115 by Yvone Neu,  RN Outcome: Progressing   Problem: Bowel/Gastric: Goal: Gastrointestinal status for postoperative course will improve 08/04/2022 0116 by Yvone Neu, RN Outcome: Progressing 08/04/2022 0115 by Yvone Neu, RN Outcome: Progressing   Problem: Clinical Measurements: Goal: Ability to maintain clinical measurements within normal limits will improve 08/04/2022 0116 by Yvone Neu, RN Outcome: Progressing 08/04/2022 0115 by Yvone Neu, RN Outcome: Progressing Goal: Postoperative complications will be avoided or minimized 08/04/2022 0116 by Yvone Neu, RN Outcome: Progressing 08/04/2022 0115 by Yvone Neu, RN Outcome: Progressing Goal: Diagnostic test results will improve 08/04/2022 0116 by Yvone Neu, RN Outcome: Progressing 08/04/2022 0115 by Yvone Neu, RN Outcome: Progressing   Problem: Pain Management: Goal: Pain level will decrease 08/04/2022 0116 by Yvone Neu, RN Outcome: Progressing 08/04/2022 0115 by Yvone Neu, RN Outcome: Progressing   Problem: Skin Integrity: Goal: Will show signs of wound healing 08/04/2022 0116 by Yvone Neu, RN Outcome: Progressing 08/04/2022 0115 by Yvone Neu, RN Outcome: Progressing   Problem: Health Behavior/Discharge Planning: Goal: Identification of resources available to assist in meeting health care needs will improve 08/04/2022 0116 by Yvone Neu, RN Outcome: Progressing 08/04/2022 0115 by Yvone Neu, RN Outcome: Progressing   Problem: Bladder/Genitourinary: Goal: Urinary functional status for postoperative course will improve 08/04/2022 0116 by Yvone Neu, RN Outcome:  Progressing 08/04/2022 0115 by Yvone Neu, RN Outcome: Progressing

## 2022-08-04 NOTE — Evaluation (Signed)
Physical Therapy Evaluation/ discharge Patient Details Name: [name] MRN: [id number] DOB: April 07, 1965 Today's Date: 08/04/2022  History of Present Illness  57 yo female admitted 5/30 for L2-3 lateral fusion due to back and anterior thigh pain. PMhx: L3-S1 fusions, bipolar d/o, T2Dm, HTN, PTSD, Sz  Clinical Impression  Pt very pleasant, enjoys crafts and walking [place]. Pt lives with spouse and has assist of mom when spouse is at work. Pt with Rt anterior thigh pain and additional Lt thigh pain with gait. Pt educated for brace wear, transfers, ambulation and stairs with handout provided and all questions answered. Pt safe for return home without further therapy needs at this time with pt aware and agreeable. Progressive ambulation encouraged.        Recommendations for follow up therapy are one component of a multi-disciplinary discharge planning process, led by the attending physician.  Recommendations may be updated based on patient status, additional functional criteria and insurance authorization.  Follow Up Recommendations       Assistance Recommended at Discharge PRN  Patient can return home with the following  Assistance with cooking/housework;Assist for transportation;Help with stairs or ramp for entrance    Equipment Recommendations BSC/3in1  Recommendations for Other Services       Functional Status Assessment Patient has not had a recent decline in their functional status     Precautions / Restrictions Precautions Precautions: Back Precaution Booklet Issued: Yes (comment) Required Braces or Orthoses: Spinal Brace Spinal Brace: Lumbar corset;Applied in sitting position      Mobility  Bed Mobility Overal bed mobility: Modified Independent                  Transfers Overall transfer level: Modified independent                      Ambulation/Gait Ambulation/Gait assistance: Modified independent (Device/Increase time) Gait Distance (Feet):  400 Feet Assistive device: None Gait Pattern/deviations: Step-through pattern, Decreased stride length   Gait velocity interpretation: 1.31 - 2.62 ft/sec, indicative of limited community ambulator   General Gait Details: slow cautious gait, good posture, pt able to self-determine distance  Stairs Stairs: Yes Stairs assistance: Min assist Stair Management: Forwards, Step to pattern Number of Stairs: 4 General stair comments: HHA with good stability on stairs  Wheelchair Mobility    Modified Rankin (Stroke Patients Only)       Balance Overall balance assessment: No apparent balance deficits (not formally assessed)                                           Pertinent Vitals/Pain Pain Assessment Pain Assessment: 0-10 Pain Score: 8  Pain Location: bil anterior thighs Pain Descriptors / Indicators: Aching, Constant Pain Intervention(s): Limited activity within patient's tolerance, Monitored during session, Repositioned, Patient requesting pain meds-RN notified    Home Living Family/patient expects to be discharged to:: Private residence Living Arrangements: Spouse/significant other Available Help at Discharge: Family;Available 24 hours/day (spouse works and will stay with mom while he is gone) Type of Home: House Home Access: Stairs to enter Entrance Stairs-Rails: None Entrance Stairs-Number of Steps: 5   Home Layout: One level Home Equipment: Shower seat      Prior Function Prior Level of Function : Independent/Modified Independent  Hand Dominance        Extremity/Trunk Assessment   Upper Extremity Assessment Upper Extremity Assessment: Overall WFL for tasks assessed    Lower Extremity Assessment Lower Extremity Assessment: Overall WFL for tasks assessed    Cervical / Trunk Assessment Cervical / Trunk Assessment: Back Surgery  Communication   Communication: No difficulties  Cognition Arousal/Alertness:  Awake/alert Behavior During Therapy: WFL for tasks assessed/performed Overall Cognitive Status: Within Functional Limits for tasks assessed                                          General Comments      Exercises     Assessment/Plan    PT Assessment Patient does not need any further PT services  PT Problem List         PT Treatment Interventions      PT Goals (Current goals can be found in the Care Plan section)  Acute Rehab PT Goals PT Goal Formulation: All assessment and education complete, DC therapy    Frequency       Co-evaluation               AM-PAC PT "6 Clicks" Mobility  Outcome Measure Help needed turning from your back to your side while in a flat bed without using bedrails?: None Help needed moving from lying on your back to sitting on the side of a flat bed without using bedrails?: None Help needed moving to and from a bed to a chair (including a wheelchair)?: None Help needed standing up from a chair using your arms (e.g., wheelchair or bedside chair)?: None Help needed to walk in hospital room?: A Little Help needed climbing 3-5 steps with a railing? : A Little 6 Click Score: 22    End of Session Equipment Utilized During Treatment: Back brace Activity Tolerance: Patient tolerated treatment well Patient left: in chair;with call bell/phone within reach Nurse Communication: Mobility status PT Visit Diagnosis: Other abnormalities of gait and mobility (R26.89)    Time: 4098-1191 PT Time Calculation (min) (ACUTE ONLY): 24 min   Charges:   PT Evaluation $PT Eval Low Complexity: 1 Low PT Treatments $Therapeutic Activity: 8-22 mins        Merryl Hacker, PT Acute Rehabilitation Services Office: [phone]   Cristine Polio 08/04/2022, 8:18 AM

## 2022-08-04 NOTE — Evaluation (Signed)
Occupational Therapy Evaluation Patient Details Name: [name] MRN: [id number] DOB: [birth year] Today's Date: 08/04/2022   History of Present Illness 57 yo female admitted 5/30 for L2-3 lateral fusion due to back and anterior thigh pain. PMhx: L3-S1 fusions, bipolar d/o, T2Dm, HTN, PTSD, Sz   Clinical Impression   Patient evaluated by Occupational Therapy with no further acute OT needs identified. All education has been completed and the patient has no further questions. Discussed back precautions pertaining to ADL completion. Education provided on pain  management techniques and positioning techniques for bed. Use ice instead of heat for at last 4 weeks unless MD states others. No follow-up Occupational Therapy. OT is signing off. Thank you for this referral.       Recommendations for follow up therapy are one component of a multi-disciplinary discharge planning process, led by the attending physician.  Recommendations may be updated based on patient status, additional functional criteria and insurance authorization.   Assistance Recommended at Discharge PRN  Patient can return home with the following Assistance with cooking/housework;Assist for transportation    Functional Status Assessment  Patient has had a recent decline in their functional status and demonstrates the ability to make significant improvements in function in a reasonable and predictable amount of time.  Equipment Recommendations  BSC/3in1       Precautions / Restrictions Precautions Precautions: Back Precaution Booklet Issued: Yes (comment) Precaution Comments: PT provided handout during evaluation. OT provided verbal instructions and reviewed back precautions Required Braces or Orthoses: Spinal Brace Spinal Brace: Lumbar corset;Applied in sitting position Restrictions Weight Bearing Restrictions: No      Mobility Bed Mobility Overal bed mobility: Modified Independent    General bed mobility comments: able  to verbalize back precautions. OT did provide VC regarding no twisting when pt was transitioning from sidelying to supine. Patient Response: Cooperative  Transfers Overall transfer level: Modified independent Equipment used: None           Balance Overall balance assessment: No apparent balance deficits (not formally assessed)         ADL either performed or assessed with clinical judgement   ADL Overall ADL's : Modified independent           Vision Baseline Vision/History: 0 No visual deficits Ability to See in Adequate Light: 0 Adequate Patient Visual Report: No change from baseline Vision Assessment?: No apparent visual deficits            Pertinent Vitals/Pain Pain Assessment Pain Assessment: Faces Faces Pain Scale: Hurts even more Pain Location: low back primarily focused on right side and down right thigh Pain Descriptors / Indicators: Discomfort, Constant Pain Intervention(s): Repositioned, Ice applied, Monitored during session     Hand Dominance Right   Extremity/Trunk Assessment Upper Extremity Assessment Upper Extremity Assessment: Overall WFL for tasks assessed   Lower Extremity Assessment Lower Extremity Assessment: Overall WFL for tasks assessed   Cervical / Trunk Assessment Cervical / Trunk Assessment: Back Surgery   Communication Communication Communication: No difficulties   Cognition Arousal/Alertness: Awake/alert Behavior During Therapy: WFL for tasks assessed/performed Overall Cognitive Status: Within Functional Limits for tasks assessed          General Comments  Lumbar incision open to air and closed.            Home Living Family/patient expects to be discharged to:: Private residence Living Arrangements: Spouse/significant other Available Help at Discharge: Family;Available 24 hours/day (Mother is able to assist when spouse is at work if needed.) Type  of Home: House Home Access: Stairs to enter [employer] of  Steps: 5 Entrance Stairs-Rails: None Home Layout: One level     Bathroom Shower/Tub: Producer, television/film/video: Standard     Home Equipment: None   Additional Comments: Pt reports during OT eval that she does not have a shower seat      Prior Functioning/Environment Prior Level of Function : Independent/Modified Independent  Mobility Comments: independent ADLs Comments: independent        OT Problem List: Pain;Decreased activity tolerance      OT Treatment/Interventions:   Self care/home management   OT Goals(Current goals can be found in the care plan section) Acute Rehab OT Goals Patient Stated Goal: to decrease pain  OT Frequency:  1X visit       AM-PAC OT "6 Clicks" Daily Activity     Outcome Measure Help from another person eating meals?: None Help from another person taking care of personal grooming?: None Help from another person toileting, which includes using toliet, bedpan, or urinal?: None Help from another person bathing (including washing, rinsing, drying)?: None Help from another person to put on and taking off regular upper body clothing?: None Help from another person to put on and taking off regular lower body clothing?: None 6 Click Score: 24   End of Session Equipment Utilized During Treatment: Back brace Nurse Communication: Mobility status  Activity Tolerance: Patient tolerated treatment well Patient left: in bed;with call bell/phone within reach  OT Visit Diagnosis: Pain Pain - Right/Left: Right Pain - part of body: Leg (low back)                Time: 4098-1191 OT Time Calculation (min): 17 min Charges:  OT General Charges $OT Visit: 1 Visit OT Evaluation $OT Eval Low Complexity: 1 Low OT Treatments $Self Care/Home Management : 8-22 mins  Limmie Patricia, OTR/L,CBIS  Supplemental OT - [facility] and [facility] Secure Chat Preferred    Ilham Roughton, Charisse March 08/04/2022, 9:02 AM

## 2022-08-04 NOTE — Discharge Summary (Signed)
Physician Discharge Summary  Patient ID: [name] MRN: [id number] DOB/AGE: 08/27/1965 57 y.o.  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 Principal Problem:   Lumbar stenosis with neurogenic claudication   Discharged Condition: good  Hospital Course: Patient was admitted to undergo surgery which she tolerated well  Consults: None  Significant Diagnostic Studies: None  Treatments: surgery: See op note  Discharge Exam: Blood pressure 111/74, pulse 81, temperature 97.9 F (36.6 C), temperature source Oral, resp. rate 20, height 5\' 6"  (1.676 m), weight 97.1 kg, SpO2 98 %. Incision is clean and dry motor function is intact  Disposition: Discharge disposition: 01-Home or Self Care       Discharge Instructions     Call MD for:  redness, tenderness, or signs of infection (pain, swelling, redness, odor or green/yellow discharge around incision site)   Complete by: As directed    Call MD for:  severe uncontrolled pain   Complete by: As directed    Call MD for:  temperature >100.4   Complete by: As directed    Diet - low sodium heart healthy   Complete by: As directed    Discharge instructions   Complete by: As directed    Okay to shower. Do not apply salves or appointments to incision. No heavy lifting with the upper extremities greater than 15 pounds. May resume driving when not requiring pain medication and patient feels comfortable with doing so.   Incentive spirometry RT   Complete by: As directed    Increase activity slowly   Complete by: As directed       Allergies as of 08/04/2022       Reactions   Penicillins Hives   Has patient had a PCN reaction causing immediate rash, facial/tongue/throat swelling, SOB or lightheadedness with hypotension: Yes Has patient had a PCN reaction  causing severe rash involving mucus membranes or skin necrosis: No Has patient had a PCN reaction that required hospitalization No Has patient had a PCN reaction occurring within the last 10 years: No If all of the above answers are "NO", then may proceed with Cephalosporin use.   Toradol [ketorolac Tromethamine] Hives   Hydromorphone    Other reaction(s): Hallucination   Lamotrigine Rash        Medication List     STOP taking these medications    HYDROcodone-acetaminophen 5-325 MG tablet Commonly known as: NORCO/VICODIN Replaced by: HYDROcodone-acetaminophen 10-325 MG tablet       TAKE these medications    Accu-Chek Guide test strip Generic drug: glucose blood USE AS DIRECTED TO MONITOR FSBS 1X DAILY. DX: R73.09.   amLODipine 5 MG tablet Commonly known as: NORVASC Take 10 mg by mouth daily.   BD Pen Needle Nano 2nd Gen 32G X 4 MM Misc Generic drug: Insulin Pen Needle Use to inject Victoza into the skin daily.   Blood Glucose System Pak Kit Please dispense based on patient and insurance preference. Use as directed to monitor FSBS 1x daily. Dx: R73.09.   buPROPion 150 MG 24 hr tablet Commonly known as: WELLBUTRIN XL TAKE 1 TABLET BY MOUTH EVERY DAY   clotrimazole-betamethasone cream Commonly known as: Lotrisone Apply 1 Application topically 2 (two) times daily.   fluticasone 50 MCG/ACT nasal spray Commonly known as: FLONASE PLACE 1 SPRAY INTO BOTH NOSTRILS 2 (TWO) TIMES DAILY   hydrochlorothiazide 25 MG tablet Commonly known as: HYDRODIURIL  Take 1 tablet (25 mg total) by mouth daily.   HYDROcodone-acetaminophen 10-325 MG tablet Commonly known as: Norco Take 1 tablet by mouth every 4 (four) hours as needed for severe pain. Replaces: HYDROcodone-acetaminophen 5-325 MG tablet   Lancets Misc Please dispense based on patient and insurance preference. Use as directed to monitor FSBS 1x daily. Dx: R73.09.   ondansetron 4 MG tablet Commonly known as:  ZOFRAN TAKE 1 TABLET BY MOUTH EVERY 8 HOURS AS NEEDED FOR NAUSEA AND VOMITING   Oracea 40 MG Cpdr Generic drug: Doxycycline (Rosacea) Take 40 mg by mouth daily.   sitaGLIPtin 100 MG tablet Commonly known as: Januvia Take 1 tablet (100 mg total) by mouth daily.   traMADol 50 MG tablet Commonly known as: ULTRAM Take 50 mg by mouth every 6 (six) hours as needed for moderate pain or severe pain.   venlafaxine XR 150 MG 24 hr capsule Commonly known as: EFFEXOR-XR Take 2 capsules (300 mg total) by mouth daily with breakfast.   zonisamide 100 MG capsule Commonly known as: ZONEGRAN Take 200mg  daily at bedtime         Signed: Stefani Dama 08/04/2022, 3:13 PM

## 2022-08-04 NOTE — Plan of Care (Signed)

## 2022-08-07 ENCOUNTER — Telehealth: Payer: Self-pay

## 2022-08-07 NOTE — Transitions of Care (Post Inpatient/ED Visit) (Signed)
08/07/2022  Name: [name] MRN: [id number] DOB: 07/23/1965  Today's TOC FU Call Status: Today's TOC FU Call Status:: Successful TOC FU Call Competed TOC FU Call Complete Date: 08/07/22  Transition Care Management Follow-up Telephone Call Date of Discharge: 08/04/22 Discharge Facility: [facility]) Type of Discharge: Inpatient Admission Primary Inpatient Discharge Diagnosis:: Diabetes Reason for ED Visit: Other: How have you been since you were released from the hospital?: Better Any questions or concerns?: No  Items Reviewed: Did you receive and understand the discharge instructions provided?: Yes Medications obtained,verified, and reconciled?: Yes (Medications Reviewed) Any new allergies since your discharge?: No Dietary orders reviewed?: NA Do you have support at home?: Yes  Medications Reviewed Today: Medications Reviewed Today     Reviewed by Annabell Sabal, CMA (Certified Medical Assistant) on 08/07/22 at [phone]  Med List Status: <None>   Medication Order Taking? Sig Documenting Provider Last Dose Status Informant  ACCU-CHEK GUIDE test strip [id number] Yes USE AS DIRECTED TO MONITOR FSBS 1X DAILY. DX: R73.09. Donita Brooks, MD Taking Active Self  amLODipine (NORVASC) 5 MG tablet [id number] Yes Take 10 mg by mouth daily. [provider] Taking Active Self  Blood Glucose Monitoring Suppl (BLOOD GLUCOSE SYSTEM PAK) KIT [id number] Yes Please dispense based on patient and insurance preference. Use as directed to monitor FSBS 1x daily. Dx: R73.09. Donita Brooks, MD Taking Active Self  buPROPion (WELLBUTRIN XL) 150 MG 24 hr tablet [id number] Yes TAKE 1 TABLET BY MOUTH EVERY DAY Donita Brooks, MD Taking Active Self  clotrimazole-betamethasone (LOTRISONE) cream [id number] Yes Apply 1 Application topically 2 (two) times daily. Donita Brooks, MD Taking Active Self  fluticasone [facility]) 50 MCG/ACT nasal spray [id number] Yes PLACE 1 SPRAY INTO BOTH NOSTRILS 2  (TWO) TIMES DAILY Donita Brooks, MD Taking Active Self  hydrochlorothiazide (HYDRODIURIL) 25 MG tablet [id number] Yes Take 1 tablet (25 mg total) by mouth daily. Donita Brooks, MD Taking Active Self  HYDROcodone-acetaminophen [facility]) 10-325 MG tablet [id number] Yes Take 1 tablet by mouth every 4 (four) hours as needed for severe pain. Barnett Abu, MD Taking Active   Insulin Pen Needle (BD PEN NEEDLE NANO 2ND GEN) 32G X 4 MM MISC [id number] Yes Use to inject Victoza into the skin daily. Donita Brooks, MD Taking Active Self  Lancets MISC [id number] Yes Please dispense based on patient and insurance preference. Use as directed to monitor FSBS 1x daily. Dx: R73.09. Donita Brooks, MD Taking Active Self  ondansetron (ZOFRAN) 4 MG tablet [id number] Yes TAKE 1 TABLET BY MOUTH EVERY 8 HOURS AS NEEDED FOR NAUSEA AND VOMITING Donita Brooks, MD Taking Active Self  ORACEA 40 MG CPDR [id number] Yes Take 40 mg by mouth daily. [provider] Taking Active Self  sitaGLIPtin (JANUVIA) 100 MG tablet [id number] Yes Take 1 tablet (100 mg total) by mouth daily. Donita Brooks, MD Taking Active Self  traMADol Janean Sark) 50 MG tablet [id number] Yes Take 50 mg by mouth every 6 (six) hours as needed for moderate pain or severe pain. [provider] Taking Active Self  venlafaxine XR (EFFEXOR-XR) 150 MG 24 hr capsule [id number] Yes Take 2 capsules (300 mg total) by mouth daily with breakfast. Donita Brooks, MD Taking Active Self  zonisamide (ZONEGRAN) 100 MG capsule [id number] Yes Take 200mg  daily at bedtime [name], [name], NP Taking Active Self            Home Care and Equipment/Supplies: Were Home Health Services Ordered?: NA Any  new equipment or medical supplies ordered?: Yes Name of Medical supply agency?: unknown Were you able to get the equipment/medical supplies?: Yes Do you have any questions related to the use of the equipment/supplies?: No  Functional Questionnaire: Do you  need assistance with bathing/showering or dressing?: No Do you need assistance with meal preparation?: No Do you need assistance with eating?: No Do you have difficulty maintaining continence: No Do you need assistance with getting out of bed/getting out of a chair/moving?: No Do you have difficulty managing or taking your medications?: Yes  Follow up appointments reviewed: PCP Follow-up appointment confirmed?: No MD Provider Line [phone] Given: Yes Specialist Hospital Follow-up appointment confirmed?: Yes Date of Specialist follow-up appointment?: 08/18/22 Follow-Up Specialty Provider:: [facility] Do you need transportation to your follow-up appointment?: No Do you understand care options if your condition(s) worsen?: Yes-patient verbalized understanding    SIGNATURE Fredirick Maudlin

## 2022-08-31 ENCOUNTER — Other Ambulatory Visit: Payer: Self-pay | Admitting: Family Medicine

## 2022-09-04 NOTE — Telephone Encounter (Signed)
Requested medication (s) are due for refill today: yes  Requested medication (s) are on the active medication list:yes  Last refill:  01/19/22 30 g  Future visit scheduled:no  Notes to clinic:  med not assigned to a protocol   Requested Prescriptions  Pending Prescriptions Disp Refills   clotrimazole-betamethasone (LOTRISONE) cream [Pharmacy Med Name: CLOTRIMAZOLE-BETAMETHASONE CRM] 30 g 0    Sig: APPLY TO AFFECTED AREA TWICE A DAY     Off-Protocol Failed - 08/31/2022  6:47 PM      Failed - Medication not assigned to a protocol, review manually.      Failed - Valid encounter within last 12 months    Recent Outpatient Visits           1 year ago Acute pain of left shoulder   [facility], [name], MD   1 year ago Memory loss   [facility] Donita Brooks, MD   1 year ago Controlled type 2 diabetes mellitus with complication, without long-term current use of insulin (HCC)   [facility] [place]ard, [name], MD   1 year ago Fall, initial encounter   [facility] Pickard, [name], MD   1 year ago Acute nasopharyngitis   [facility] [place]ard, [name], MD

## 2022-09-26 ENCOUNTER — Encounter: Payer: Self-pay | Admitting: Family Medicine

## 2022-09-29 ENCOUNTER — Ambulatory Visit (HOSPITAL_COMMUNITY): Payer: Commercial Managed Care - PPO | Attending: Neurological Surgery | Admitting: Physical Therapy

## 2022-09-29 ENCOUNTER — Encounter (HOSPITAL_COMMUNITY): Payer: Self-pay | Admitting: Physical Therapy

## 2022-09-29 ENCOUNTER — Other Ambulatory Visit: Payer: Self-pay

## 2022-09-29 DIAGNOSIS — M6281 Muscle weakness (generalized): Secondary | ICD-10-CM | POA: Diagnosis present

## 2022-09-29 DIAGNOSIS — R2689 Other abnormalities of gait and mobility: Secondary | ICD-10-CM | POA: Diagnosis present

## 2022-09-29 DIAGNOSIS — M5459 Other low back pain: Secondary | ICD-10-CM | POA: Diagnosis not present

## 2022-09-29 DIAGNOSIS — R29898 Other symptoms and signs involving the musculoskeletal system: Secondary | ICD-10-CM | POA: Diagnosis present

## 2022-09-29 NOTE — Therapy (Signed)
OUTPATIENT PHYSICAL THERAPY THORACOLUMBAR EVALUATION   Patient Name: NATE BENTZEN MRN: [id number] DOB:[birth year], 57 y.o., female Today's Date: 09/29/2022  END OF SESSION:  PT End of Session - 09/29/22 1034     Visit Number 1    Number of Visits 12    Date for PT Re-Evaluation 11/10/22    Authorization Type Primary Medicare Secondary: [employer]    Progress Note Due on Visit 10    PT Start Time 1035    PT Stop Time 1135    PT Time Calculation (min) 60 min    Activity Tolerance Patient tolerated treatment well    Behavior During Therapy WFL for tasks assessed/performed             Past Medical History:  Diagnosis Date   Anxiety    Bipolar disorder (HCC)    Depression    Diabetes mellitus without complication (HCC)    Type 2   Dyspnea    r/t anxiety   Encounter for neuropsychological testing    at [facility] 3/20-suggest possible somatization   Headache    Hypertension    Migraines    Pseudoseizure    Last seizure March/April 2024   PTSD (post-traumatic stress disorder)    Rosacea    Seizures (HCC)    [facility] (Dr. 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 1990s   ANTERIOR LAT LUMBAR FUSION N/A 08/03/2022   Procedure: Lumbar two-three extreme Lateral Interbody Fusion;  Surgeon: Barnett Abu, MD;  Location: [facility] OR;  Service: Neurosurgery;  Laterality: N/A;   APPENDECTOMY     BACK SURGERY     x2   CHOLECYSTECTOMY     COLONOSCOPY WITH PROPOFOL N/A 07/20/2021   Surgeon: Corbin Ade, MD;  Three 6-8 mm polyps in the descending colon and cecum removed, diverticulosis in the entire colon, otherwise normal exam.  Pathology with tubular adenomas.  Recommended 5-year surveillance.   CYST EXCISION     on ovaries   lumbar     ruptured disc in lumbar taken out    POLYPECTOMY  07/20/2021   Procedure: POLYPECTOMY;  Surgeon: Corbin Ade, MD;  Location: [facility];  Service: Endoscopy;;   ROTATOR CUFF REPAIR Left    TONSILLECTOMY     TUBAL LIGATION     Patient Active Problem List   Diagnosis Date Noted   Pain of right hip joint 02/22/2022   Low back pain 09/21/2021   Pain in joint of left shoulder 07/19/2021   Cervical radiculopathy 07/19/2021   Elevated LFTs 07/04/2021   Fatty liver 07/04/2021   Constipation 07/04/2021   History of colonic polyps 07/04/2021   Migraine without aura and without status migrainosus, not intractable 08/24/2020   History of head injury 08/24/2020   Intractable chronic post-traumatic headache 04/20/2020   History of subarachnoid hemorrhage 04/20/2020   COVID-19 virus infection 12/05/2019   New onset type 2 diabetes mellitus (HCC) 11/12/2019   Subarachnoid hemorrhage following injury (HCC) 11/11/2019   Class 1 obesity due to excess calories with body mass index (BMI) of 34.0 to 34.9 in adult 11/11/2019   Encounter for monitoring postmenopausal estrogen replacement therapy 01/27/2019   Weight loss counseling, encounter for 01/27/2019   Vaginal odor 12/02/2018   Hot flashes due to menopause 12/02/2018   No energy 12/02/2018  Decreased libido 12/02/2018   Counseling for estrogen replacement therapy 12/02/2018   History of seizure 12/02/2018   Encounter for neuropsychological testing    Lumbar stenosis with neurogenic claudication 05/23/2018   Pain in left knee 07/26/2017   Seizures (HCC)    Psychogenic nonepileptic seizure 11/13/2015   Jerking movements of extremities 11/11/2015   Major depressive disorder, recurrent severe without psychotic features (HCC)    Bipolar II disorder (HCC) 08/30/2015   Suicide attempt by drug ingestion (HCC) 08/30/2015   Suicidal ideation 08/30/2015   Migraines 09/29/2014   Migraine 05/19/2013   Left-sided weakness 05/18/2013   Numbness and tingling of left arm and leg  05/18/2013   CVA (cerebral infarction) 05/18/2013   Hypertension    Anxiety     PCP: Lynnea Ferrier MD  REFERRING PROVIDER: Barnett Abu, MD  REFERRING DIAG: [phone] (ICD-10-CM) - Spinal stenosis, lumbar region with neurogenic claudication  Rationale for Evaluation and Treatment: Rehabilitation  THERAPY DIAG:  Other low back pain  Muscle weakness (generalized)  Other abnormalities of gait and mobility  Other symptoms and signs involving the musculoskeletal system  ONSET DATE: DOS 08/03/22  SUBJECTIVE:                                                                                                                                                                                           SUBJECTIVE STATEMENT: Patient states she continues to remain sore since surgery. Trouble with getting off the toilet. Legs and back are weak. Trouble getting off the floor, bed mobility, transfers. Was unable to get home health after surgery. Numbness in front of thigh since surgery. Back feels like its getting better, still pain on right side.   PERTINENT HISTORY:  initial fusion at L3-4 a subsequent fusion at L5-S1 and more recently decompression fusion for spondylolisthesis at L4-L5. And then L2-L3 on 08/03/22; bipolar disorder, depression, DM, HTN, Hx seizures, PTSD  PAIN:  Are you having pain? Yes: NPRS scale: 1-2/10 Pain location: R posterior/lateral flank Pain description: unable to state Aggravating factors: walking, bending, bed mobility Relieving factors: advil  PRECAUTIONS: None  WEIGHT BEARING RESTRICTIONS: No  FALLS:  Has patient fallen in last 6 months? No  OCCUPATION: Disability  PLOF: Independent  PATIENT GOALS: get back and legs stronger  OBJECTIVE:   PATIENT SURVEYS:  FOTO 59% function  SCREENING FOR RED FLAGS: Bowel or bladder incontinence: No Spinal tumors: No Cauda equina syndrome: No Compression fracture: No Abdominal aneurysm: No  COGNITION: Overall  cognitive status: Within functional limits for tasks assessed     SENSATION: WFL Numb: L L2-L3  POSTURE: rounded shoulders, forward head, and increased  thoracic kyphosis  PALPATION: TTP R QL   LUMBAR ROM:   AROM eval  Flexion 50% limited (Pain/stiff in mid -T/sp  Extension 75% limited  Right lateral flexion 25% limited  Left lateral flexion 25% limited  Right rotation 25% limited  Left rotation 25% limited   (Blank rows = not tested)  LOWER EXTREMITY ROM:   decreased hip extension ROM bilaterally   LOWER EXTREMITY MMT:    MMT Right eval Left eval  Hip flexion 4 4-  Hip extension 4- 3+  Hip abduction 4- 4-  Hip adduction    Hip internal rotation    Hip external rotation    Knee flexion 5 4+  Knee extension 5 4+  Ankle dorsiflexion 5 4+  Ankle plantarflexion    Ankle inversion    Ankle eversion     (Blank rows = not tested)  LUMBAR SPECIAL TESTS:  Femoral nerve tension test: no change in symptoms on L   FUNCTIONAL TESTS:  5 times sit to stand: 12.19 seconds with UE use; 2 minute walk test: 450 feet Stairs: unilateral UE support STS: labored with bilateral UE support; able to complete without UE support with dynamic knee valgus bilaterally  GAIT: Distance walked: 450 feet Assistive device utilized: None Level of assistance: Complete Independence Comments:  TODAY'S TREATMENT:                                                                                                                              DATE:  09/29/22  Eval and education   PATIENT EDUCATION:  Education details: Patient educated on exam findings, POC, scope of PT, HEP, and beginning a walking program/any mobility in the morning. Person educated: Patient Education method: Explanation, Demonstration, and Handouts Education comprehension: verbalized understanding, returned demonstration, verbal cues required, and tactile cues required  HOME EXERCISE PROGRAM: Beginning a walking/mobility  program  ASSESSMENT:  CLINICAL IMPRESSION: Patient a 57 y.o. y.o. female who was seen today for physical therapy evaluation and treatment for LBP. Patient presents with pain limited deficits in bilateral LE and lumbar spine strength, ROM, endurance, activity tolerance, and functional mobility with ADL. Patient is having to modify and restrict ADL as indicated by outcome measure score as well as subjective information and objective measures which is affecting overall participation. Patient will benefit from skilled physical therapy in order to improve function and reduce impairment.  OBJECTIVE IMPAIRMENTS: decreased activity tolerance, decreased balance, decreased mobility, difficulty walking, decreased ROM, decreased strength, increased muscle spasms, impaired flexibility, improper body mechanics, postural dysfunction, and pain.   ACTIVITY LIMITATIONS: carrying, lifting, bending, squatting, stairs, transfers, bed mobility, reach over head, hygiene/grooming, locomotion level, and caring for others  PARTICIPATION LIMITATIONS: meal prep, cleaning, laundry, driving, shopping, community activity, and yard work  PERSONAL FACTORS: Fitness, Past/current experiences, Time since onset of injury/illness/exacerbation, and 3+ comorbidities: 4 lumbar surgeries, bipolar disorder, depression, DM, HTN, Hx seizures, PTSD  are also affecting patient's functional outcome.  REHAB POTENTIAL: Good  CLINICAL DECISION MAKING: Stable/uncomplicated  EVALUATION COMPLEXITY: Low   GOALS: Goals reviewed with patient? Yes  SHORT TERM GOALS: Target date: 10/20/2022    Patient will be independent with HEP in order to improve functional outcomes. Baseline:  Goal status: INITIAL  2.  Patient will report at least 25% improvement in symptoms for improved quality of life. Baseline:  Goal status: INITIAL    LONG TERM GOALS: Target date: 11/10/2022    Patient will report at least 75% improvement in symptoms for  improved quality of life. Baseline:  Goal status: INITIAL  2.  Patient will improve FOTO score by at least 5 points in order to indicate improved tolerance to activity. Baseline: 59% function Goal status: INITIAL  3.  Patient will demonstrate grade of 4+/5 MMT grade in all tested musculature as evidence of improved strength to assist with stair ambulation and gait.   Baseline: see above Goal status: INITIAL  4.  Patient will be able to complete 5x STS in under 11.4 seconds in order to demonstrate improved functional strength. Baseline: 12.19 seconds with UE use; Goal status: INITIAL     PLAN:  PT FREQUENCY: 2x/week  PT DURATION: 6 weeks  PLANNED INTERVENTIONS: Therapeutic exercises, Therapeutic activity, Neuromuscular re-education, Balance training, Gait training, Patient/Family education, Joint manipulation, Joint mobilization, Stair training, Orthotic/Fit training, DME instructions, Aquatic Therapy, Dry Needling, Electrical stimulation, Spinal manipulation, Spinal mobilization, Cryotherapy, Moist heat, Compression bandaging, scar mobilization, Splintting, Taping, Traction, Ultrasound, Ionotophoresis 4mg /ml Dexamethasone, and Manual therapy  PLAN FOR NEXT SESSION: postural, core and hips strength, functional strength, LLE strength   Reola Mosher Areonna Bran, PT 09/29/2022, 11:39 AM

## 2022-10-03 ENCOUNTER — Ambulatory Visit (HOSPITAL_COMMUNITY): Payer: Commercial Managed Care - PPO | Admitting: Physical Therapy

## 2022-10-03 DIAGNOSIS — M5459 Other low back pain: Secondary | ICD-10-CM

## 2022-10-03 DIAGNOSIS — M6281 Muscle weakness (generalized): Secondary | ICD-10-CM

## 2022-10-03 DIAGNOSIS — R2689 Other abnormalities of gait and mobility: Secondary | ICD-10-CM

## 2022-10-03 NOTE — Therapy (Signed)
OUTPATIENT PHYSICAL THERAPY TREATMENT   Patient Name: [name] MRN: [id number] DOB:1965-09-10, 57 y.o., female Today's Date: 10/03/2022  END OF SESSION:  PT End of Session - 10/03/22 1611     Visit Number 2    Number of Visits 12    Date for PT Re-Evaluation 11/10/22    Authorization Type Primary Medicare Secondary: [employer]    Progress Note Due on Visit 10    PT Start Time 1602    PT Stop Time 1645    PT Time Calculation (min) 43 min    Activity Tolerance Patient tolerated treatment well    Behavior During Therapy WFL for tasks assessed/performed              Past Medical History:  Diagnosis Date   Anxiety    Bipolar disorder (HCC)    Depression    Diabetes mellitus without complication (HCC)    Type 2   Dyspnea    r/t anxiety   Encounter for neuropsychological testing    at [facility] 3/20-suggest possible somatization   Headache    Hypertension    Migraines    Pseudoseizure    Last seizure March/April 2024   PTSD (post-traumatic stress disorder)    Rosacea    Seizures (HCC)    [facility] (Dr. 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 1990s   ANTERIOR LAT LUMBAR FUSION N/A 08/03/2022   Procedure: Lumbar two-three extreme Lateral Interbody Fusion;  Surgeon: Barnett Abu, MD;  Location: [facility] OR;  Service: Neurosurgery;  Laterality: N/A;   APPENDECTOMY     BACK SURGERY     x2   CHOLECYSTECTOMY     COLONOSCOPY WITH PROPOFOL N/A 07/20/2021   Surgeon: Corbin Ade, MD;  Three 6-8 mm polyps in the descending colon and cecum removed, diverticulosis in the entire colon, otherwise normal exam.  Pathology with tubular adenomas.  Recommended 5-year surveillance.   CYST EXCISION     on ovaries   lumbar     ruptured disc in lumbar taken out   POLYPECTOMY   07/20/2021   Procedure: POLYPECTOMY;  Surgeon: Corbin Ade, MD;  Location: [facility];  Service: Endoscopy;;   ROTATOR CUFF REPAIR Left    TONSILLECTOMY     TUBAL LIGATION     Patient Active Problem List   Diagnosis Date Noted   Pain of right hip joint 02/22/2022   Low back pain 09/21/2021   Pain in joint of left shoulder 07/19/2021   Cervical radiculopathy 07/19/2021   Elevated LFTs 07/04/2021   Fatty liver 07/04/2021   Constipation 07/04/2021   History of colonic polyps 07/04/2021   Migraine without aura and without status migrainosus, not intractable 08/24/2020   History of head injury 08/24/2020   Intractable chronic post-traumatic headache 04/20/2020   History of subarachnoid hemorrhage 04/20/2020   COVID-19 virus infection 12/05/2019   New onset type 2 diabetes mellitus (HCC) 11/12/2019   Subarachnoid hemorrhage following injury (HCC) 11/11/2019   Class 1 obesity due to excess calories with body mass index (BMI) of 34.0 to 34.9 in adult 11/11/2019   Encounter for monitoring postmenopausal estrogen replacement therapy 01/27/2019   Weight loss counseling, encounter for 01/27/2019   Vaginal odor 12/02/2018   Hot flashes due to menopause 12/02/2018   No energy 12/02/2018  Decreased libido 12/02/2018   Counseling for estrogen replacement therapy 12/02/2018   History of seizure 12/02/2018   Encounter for neuropsychological testing    Lumbar stenosis with neurogenic claudication 05/23/2018   Pain in left knee 07/26/2017   Seizures (HCC)    Psychogenic nonepileptic seizure 11/13/2015   Jerking movements of extremities 11/11/2015   Major depressive disorder, recurrent severe without psychotic features (HCC)    Bipolar II disorder (HCC) 08/30/2015   Suicide attempt by drug ingestion (HCC) 08/30/2015   Suicidal ideation 08/30/2015   Migraines 09/29/2014   Migraine 05/19/2013   Left-sided weakness 05/18/2013   Numbness and tingling of left arm and leg 05/18/2013   CVA  (cerebral infarction) 05/18/2013   Hypertension    Anxiety     PCP: Lynnea Ferrier MD  REFERRING PROVIDER: Barnett Abu, MD  REFERRING DIAG: [phone] (ICD-10-CM) - Spinal stenosis, lumbar region with neurogenic claudication  Rationale for Evaluation and Treatment: Rehabilitation  THERAPY DIAG:  Other low back pain  Other abnormalities of gait and mobility  Muscle weakness (generalized)  ONSET DATE: DOS 08/03/22  SUBJECTIVE:                                                                                                                                                                                           SUBJECTIVE STATEMENT: Pt states she's walked 3 days since her last appt (Sat, Sun and yesterday) approx 1/4 mile each day.  Reports her pain is currently 3/10 in Rt lower back/lumbar region.   Evaluation: Patient states she continues to remain sore since surgery. Trouble with getting off the toilet. Legs and back are weak. Trouble getting off the floor, bed mobility, transfers. Was unable to get home health after surgery. Numbness in front of thigh since surgery. Back feels like its getting better, still pain on right side.   PERTINENT HISTORY:  initial fusion at L3-4 a subsequent fusion at L5-S1 and more recently decompression fusion for spondylolisthesis at L4-L5. And then L2-L3 on 08/03/22; bipolar disorder, depression, DM, HTN, Hx seizures, PTSD  PAIN:  Are you having pain? Yes: NPRS scale: 1-2/10 Pain location: R posterior/lateral flank Pain description: unable to state Aggravating factors: walking, bending, bed mobility Relieving factors: advil  PRECAUTIONS: None  WEIGHT BEARING RESTRICTIONS: No  FALLS:  Has patient fallen in last 6 months? No  OCCUPATION: Disability  PLOF: Independent  PATIENT GOALS: get back and legs stronger  OBJECTIVE:   PATIENT SURVEYS:  FOTO 59% function  SCREENING FOR RED FLAGS: Bowel or bladder incontinence: No Spinal tumors:  No Cauda equina syndrome: No Compression fracture: No Abdominal aneurysm: No  COGNITION: Overall  cognitive status: Within functional limits for tasks assessed     SENSATION: WFL Numb: L L2-L3  POSTURE: rounded shoulders, forward head, and increased thoracic kyphosis  PALPATION: TTP R QL   LUMBAR ROM:   AROM eval  Flexion 50% limited (Pain/stiff in mid -T/sp  Extension 75% limited  Right lateral flexion 25% limited  Left lateral flexion 25% limited  Right rotation 25% limited  Left rotation 25% limited   (Blank rows = not tested)  LOWER EXTREMITY ROM:   decreased hip extension ROM bilaterally   LOWER EXTREMITY MMT:    MMT Right eval Left eval  Hip flexion 4 4-  Hip extension 4- 3+  Hip abduction 4- 4-  Hip adduction    Hip internal rotation    Hip external rotation    Knee flexion 5 4+  Knee extension 5 4+  Ankle dorsiflexion 5 4+  Ankle plantarflexion    Ankle inversion    Ankle eversion     (Blank rows = not tested)  LUMBAR SPECIAL TESTS:  Femoral nerve tension test: no change in symptoms on L   FUNCTIONAL TESTS:  5 times sit to stand: 12.19 seconds with UE use; 2 minute walk test: 450 feet Stairs: unilateral UE support STS: labored with bilateral UE support; able to complete without UE support with dynamic knee valgus bilaterally  GAIT: Distance walked: 450 feet Assistive device utilized: None Level of assistance: Complete Independence Comments:  TODAY'S TREATMENT:                                                                                                                              DATE:  10/02/21 Goal review   09/29/22  Eval and education   PATIENT EDUCATION:  Education details: Patient educated on exam findings, POC, scope of PT, HEP, and beginning a walking program/any mobility in the morning. Person educated: Patient Education method: Explanation, Demonstration, and Handouts Education comprehension: verbalized  understanding, returned demonstration, verbal cues required, and tactile cues required  HOME EXERCISE PROGRAM: Evaluation:  Beginning a walking/mobility program  Access Code: [email or web address] URL: [email or web address] Date: 10/03/2022 Prepared by: Emeline Gins Exercises - Supine Transversus Abdominis Bracing - Hands on Stomach  - 2 x daily - 7 x weekly - 1 sets - 10 reps - 5 sec hold - Beginner Bridge  - 2 x daily - 7 x weekly - 1 sets - 10 reps - Supine March  - 2 x daily - 7 x weekly - 1 sets - 10 reps - Active Straight Leg Raise with Quad Set  - 2 x daily - 7 x weekly - 1 sets - 10 reps  Hip excursions for lumbar mobility 10X each direction  ASSESSMENT:  CLINICAL IMPRESSION: Reviewed goals and POC moving forward.  Began mobility exercises for lumbar spine, core and LE strengthening.  Updated HEP to include these to improve stabiility.  Minimal cues needed with good core stabilization maintained throughout.  No pain  during or at conclusion of session today.   Patient will benefit from skilled physical therapy in order to improve function and reduce impairment.  OBJECTIVE IMPAIRMENTS: decreased activity tolerance, decreased balance, decreased mobility, difficulty walking, decreased ROM, decreased strength, increased muscle spasms, impaired flexibility, improper body mechanics, postural dysfunction, and pain.   ACTIVITY LIMITATIONS: carrying, lifting, bending, squatting, stairs, transfers, bed mobility, reach over head, hygiene/grooming, locomotion level, and caring for others  PARTICIPATION LIMITATIONS: meal prep, cleaning, laundry, driving, shopping, community activity, and yard work  PERSONAL FACTORS: Fitness, Past/current experiences, Time since onset of injury/illness/exacerbation, and 3+ comorbidities: 4 lumbar surgeries, bipolar disorder, depression, DM, HTN, Hx seizures, PTSD  are also affecting patient's functional outcome.   REHAB POTENTIAL: Good  CLINICAL DECISION  MAKING: Stable/uncomplicated  EVALUATION COMPLEXITY: Low   GOALS: Goals reviewed with patient? Yes  SHORT TERM GOALS: Target date: 10/20/2022    Patient will be independent with HEP in order to improve functional outcomes. Baseline:  Goal status: IN PROGRESS  2.  Patient will report at least 25% improvement in symptoms for improved quality of life. Baseline:  Goal status: IN PROGRESS    LONG TERM GOALS: Target date: 11/10/2022    Patient will report at least 75% improvement in symptoms for improved quality of life. Baseline:  Goal status: IN PROGRESS  2.  Patient will improve FOTO score by at least 5 points in order to indicate improved tolerance to activity. Baseline: 59% function Goal status: IN PROGRESS  3.  Patient will demonstrate grade of 4+/5 MMT grade in all tested musculature as evidence of improved strength to assist with stair ambulation and gait.   Baseline: see above Goal status: IN PROGRESS  4.  Patient will be able to complete 5x STS in under 11.4 seconds in order to demonstrate improved functional strength. Baseline: 12.19 seconds with UE use; Goal status: IN PROGRESS     PLAN:  PT FREQUENCY: 2x/week  PT DURATION: 6 weeks  PLANNED INTERVENTIONS: Therapeutic exercises, Therapeutic activity, Neuromuscular re-education, Balance training, Gait training, Patient/Family education, Joint manipulation, Joint mobilization, Stair training, Orthotic/Fit training, DME instructions, Aquatic Therapy, Dry Needling, Electrical stimulation, Spinal manipulation, Spinal mobilization, Cryotherapy, Moist heat, Compression bandaging, scar mobilization, Splintting, Taping, Traction, Ultrasound, Ionotophoresis 4mg /ml Dexamethasone, and Manual therapy  PLAN FOR NEXT SESSION: postural, core and hips strength, functional strength, LLE strength.  Body mechanics education.  Lurena Nida, PTA/CLT [facility] Ph:  [phone]   Lurena Nida, PTA 10/03/2022, 4:12 PM

## 2022-10-09 ENCOUNTER — Ambulatory Visit (HOSPITAL_COMMUNITY): Payer: Commercial Managed Care - PPO | Attending: Neurological Surgery | Admitting: Physical Therapy

## 2022-10-09 ENCOUNTER — Encounter (HOSPITAL_COMMUNITY): Payer: Self-pay | Admitting: Physical Therapy

## 2022-10-09 DIAGNOSIS — M6281 Muscle weakness (generalized): Secondary | ICD-10-CM | POA: Insufficient documentation

## 2022-10-09 DIAGNOSIS — M5459 Other low back pain: Secondary | ICD-10-CM | POA: Diagnosis present

## 2022-10-09 DIAGNOSIS — R29898 Other symptoms and signs involving the musculoskeletal system: Secondary | ICD-10-CM | POA: Insufficient documentation

## 2022-10-09 DIAGNOSIS — R2689 Other abnormalities of gait and mobility: Secondary | ICD-10-CM | POA: Diagnosis present

## 2022-10-09 NOTE — Therapy (Signed)
OUTPATIENT PHYSICAL THERAPY TREATMENT   Patient Name: [name] MRN: [id number] DOB:1965-03-16, 57 y.o., female Today's Date: 10/09/2022  END OF SESSION:  PT End of Session - 10/09/22 0819     Visit Number 3    Number of Visits 12    Date for PT Re-Evaluation 11/10/22    Authorization Type Primary Medicare Secondary: [employer]    Progress Note Due on Visit 10    PT Start Time 0820    PT Stop Time 0858    PT Time Calculation (min) 38 min    Activity Tolerance Patient tolerated treatment well    Behavior During Therapy WFL for tasks assessed/performed              Past Medical History:  Diagnosis Date   Anxiety    Bipolar disorder (HCC)    Depression    Diabetes mellitus without complication (HCC)    Type 2   Dyspnea    r/t anxiety   Encounter for neuropsychological testing    at [facility] 3/20-suggest possible somatization   Headache    Hypertension    Migraines    Pseudoseizure    Last seizure March/April 2024   PTSD (post-traumatic stress disorder)    Rosacea    Seizures (HCC)    [facility] (Dr. 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 1990s   ANTERIOR LAT LUMBAR FUSION N/A 08/03/2022   Procedure: Lumbar two-three extreme Lateral Interbody Fusion;  Surgeon: Barnett Abu, MD;  Location: [facility] OR;  Service: Neurosurgery;  Laterality: N/A;   APPENDECTOMY     BACK SURGERY     x2   CHOLECYSTECTOMY     COLONOSCOPY WITH PROPOFOL N/A 07/20/2021   Surgeon: Corbin Ade, MD;  Three 6-8 mm polyps in the descending colon and cecum removed, diverticulosis in the entire colon, otherwise normal exam.  Pathology with tubular adenomas.  Recommended 5-year surveillance.   CYST EXCISION     on ovaries   lumbar     ruptured disc in lumbar taken out   POLYPECTOMY   07/20/2021   Procedure: POLYPECTOMY;  Surgeon: Corbin Ade, MD;  Location: [facility] SUITE;  Service: Endoscopy;;   ROTATOR CUFF REPAIR Left    TONSILLECTOMY     TUBAL LIGATION     Patient Active Problem List   Diagnosis Date Noted   Pain of right hip joint 02/22/2022   Low back pain 09/21/2021   Pain in joint of left shoulder 07/19/2021   Cervical radiculopathy 07/19/2021   Elevated LFTs 07/04/2021   Fatty liver 07/04/2021   Constipation 07/04/2021   History of colonic polyps 07/04/2021   Migraine without aura and without status migrainosus, not intractable 08/24/2020   History of head injury 08/24/2020   Intractable chronic post-traumatic headache 04/20/2020   History of subarachnoid hemorrhage 04/20/2020   COVID-19 virus infection 12/05/2019   New onset type 2 diabetes mellitus (HCC) 11/12/2019   Subarachnoid hemorrhage following injury (HCC) 11/11/2019   Class 1 obesity due to excess calories with body mass index (BMI) of 34.0 to 34.9 in adult 11/11/2019   Encounter for monitoring postmenopausal estrogen replacement therapy 01/27/2019   Weight loss counseling, encounter for 01/27/2019   Vaginal odor 12/02/2018   Hot flashes due to menopause 12/02/2018   No energy 12/02/2018  Decreased libido 12/02/2018   Counseling for estrogen replacement therapy 12/02/2018   History of seizure 12/02/2018   Encounter for neuropsychological testing    Lumbar stenosis with neurogenic claudication 05/23/2018   Pain in left knee 07/26/2017   Seizures (HCC)    Psychogenic nonepileptic seizure 11/13/2015   Jerking movements of extremities 11/11/2015   Major depressive disorder, recurrent severe without psychotic features (HCC)    Bipolar II disorder (HCC) 08/30/2015   Suicide attempt by drug ingestion (HCC) 08/30/2015   Suicidal ideation 08/30/2015   Migraines 09/29/2014   Migraine 05/19/2013   Left-sided weakness 05/18/2013   Numbness and tingling of left arm and leg 05/18/2013   CVA  (cerebral infarction) 05/18/2013   Hypertension    Anxiety     PCP: Lynnea Ferrier MD  REFERRING PROVIDER: Barnett Abu, MD  REFERRING DIAG: [phone] (ICD-10-CM) - Spinal stenosis, lumbar region with neurogenic claudication  Rationale for Evaluation and Treatment: Rehabilitation  THERAPY DIAG:  Other low back pain  Other abnormalities of gait and mobility  Muscle weakness (generalized)  Other symptoms and signs involving the musculoskeletal system  ONSET DATE: DOS 08/03/22  SUBJECTIVE:                                                                                                                                                                                           SUBJECTIVE STATEMENT: Pt states HEP going well. Patient states she has been walking some, not sure how far exactly. Been doing about 1/4 mile and then not even 1/4 mile to hill  and then about 1/8 mile to power pole and then back.   Evaluation: Patient states she continues to remain sore since surgery. Trouble with getting off the toilet. Legs and back are weak. Trouble getting off the floor, bed mobility, transfers. Was unable to get home health after surgery. Numbness in front of thigh since surgery. Back feels like its getting better, still pain on right side.   PERTINENT HISTORY:  initial fusion at L3-4 a subsequent fusion at L5-S1 and more recently decompression fusion for spondylolisthesis at L4-L5. And then L2-L3 on 08/03/22; bipolar disorder, depression, DM, HTN, Hx seizures, PTSD  PAIN:  Are you having pain? Yes: NPRS scale: 1-2/10 Pain location: R posterior/lateral flank Pain description: soreness Aggravating factors: walking, bending, bed mobility Relieving factors: advil  PRECAUTIONS: None  WEIGHT BEARING RESTRICTIONS: No  FALLS:  Has patient fallen in last 6 months? No  OCCUPATION: Disability  PLOF: Independent  PATIENT GOALS: get back and legs stronger  OBJECTIVE:   PATIENT SURVEYS:   FOTO 59% function  SCREENING FOR RED FLAGS: Bowel or bladder  incontinence: No Spinal tumors: No Cauda equina syndrome: No Compression fracture: No Abdominal aneurysm: No  COGNITION: Overall cognitive status: Within functional limits for tasks assessed     SENSATION: WFL Numb: L L2-L3  POSTURE: rounded shoulders, forward head, and increased thoracic kyphosis  PALPATION: TTP R QL   LUMBAR ROM:   AROM eval  Flexion 50% limited (Pain/stiff in mid -T/sp  Extension 75% limited  Right lateral flexion 25% limited  Left lateral flexion 25% limited  Right rotation 25% limited  Left rotation 25% limited   (Blank rows = not tested)  LOWER EXTREMITY ROM:   decreased hip extension ROM bilaterally   LOWER EXTREMITY MMT:    MMT Right eval Left eval  Hip flexion 4 4-  Hip extension 4- 3+  Hip abduction 4- 4-  Hip adduction    Hip internal rotation    Hip external rotation    Knee flexion 5 4+  Knee extension 5 4+  Ankle dorsiflexion 5 4+  Ankle plantarflexion    Ankle inversion    Ankle eversion     (Blank rows = not tested)  LUMBAR SPECIAL TESTS:  Femoral nerve tension test: no change in symptoms on L   FUNCTIONAL TESTS:  5 times sit to stand: 12.19 seconds with UE use; 2 minute walk test: 450 feet Stairs: unilateral UE support STS: labored with bilateral UE support; able to complete without UE support with dynamic knee valgus bilaterally  GAIT: Distance walked: 450 feet Assistive device utilized: None Level of assistance: Complete Independence Comments:  TODAY'S TREATMENT:                                                                                                                              DATE:  10/09/22 Bridge 2 x 10  Ab set 10 x 5 second holds March with ab set 2 x 10 bilateral  SLR 1 x 10 bilateral  LTR 1 x 10 with 5 second holds DKTC with heels on green ball 10 x 5 second holds Standing hip abduction 2 x 10 Standing hip extension 2 x 10   STS 2 x 10   10/02/21 Goal review   09/29/22  Eval and education   PATIENT EDUCATION:  Education details: 10/09/22: HEP, walking program progressions EVAL: Patient educated on exam findings, POC, scope of PT, HEP, and beginning a walking program/any mobility in the morning. Person educated: Patient Education method: Explanation, Demonstration, and Handouts Education comprehension: verbalized understanding, returned demonstration, verbal cues required, and tactile cues required  HOME EXERCISE PROGRAM: Evaluation:  Beginning a walking/mobility program  Access Code: [email or web address] URL: [email or web address] Date: 10/03/2022 - Supine Transversus Abdominis Bracing - Hands on Stomach  - 2 x daily - 7 x weekly - 1 sets - 10 reps - 5 sec hold - Beginner Bridge  - 2 x daily - 7 x weekly - 1 sets - 10 reps - Supine March  - 2 x daily -  7 x weekly - 1 sets - 10 reps - Active Straight Leg Raise with Quad Set  - 2 x daily - 7 x weekly - 1 sets - 10 reps  Hip excursions for lumbar mobility 10X each direction  10/09/22 - Supine Lower Trunk Rotation  - 1 x daily - 7 x weekly - 10 reps - 5 second hold - Supine Hamstring Swiss Ball Curls/Dynamic Mobilization  - 1 x daily - 7 x weekly - 1-2 sets - 10 reps - 5 second hold - Standing Hip Abduction with Counter Support  - 1 x daily - 7 x weekly - 2 sets - 10 reps - Standing Hip Extension with Counter Support  - 1 x daily - 7 x weekly - 2 sets - 10 reps - Sit to Stand with Arms Crossed  - 1 x daily - 7 x weekly - 2 sets - 10 reps   ASSESSMENT:  CLINICAL IMPRESSION: Educated on walking program progressions. Continued with supine core exercises which are tolerated well. Began additional core and glute strengthening today and provided intermittent cueing for prior muscle engagement. Patient will continue to benefit from physical therapy in order to improve function and reduce impairment.   OBJECTIVE IMPAIRMENTS: decreased activity tolerance,  decreased balance, decreased mobility, difficulty walking, decreased ROM, decreased strength, increased muscle spasms, impaired flexibility, improper body mechanics, postural dysfunction, and pain.   ACTIVITY LIMITATIONS: carrying, lifting, bending, squatting, stairs, transfers, bed mobility, reach over head, hygiene/grooming, locomotion level, and caring for others  PARTICIPATION LIMITATIONS: meal prep, cleaning, laundry, driving, shopping, community activity, and yard work  PERSONAL FACTORS: Fitness, Past/current experiences, Time since onset of injury/illness/exacerbation, and 3+ comorbidities: 4 lumbar surgeries, bipolar disorder, depression, DM, HTN, Hx seizures, PTSD  are also affecting patient's functional outcome.   REHAB POTENTIAL: Good  CLINICAL DECISION MAKING: Stable/uncomplicated  EVALUATION COMPLEXITY: Low   GOALS: Goals reviewed with patient? Yes  SHORT TERM GOALS: Target date: 10/20/2022    Patient will be independent with HEP in order to improve functional outcomes. Baseline:  Goal status: IN PROGRESS  2.  Patient will report at least 25% improvement in symptoms for improved quality of life. Baseline:  Goal status: IN PROGRESS    LONG TERM GOALS: Target date: 11/10/2022    Patient will report at least 75% improvement in symptoms for improved quality of life. Baseline:  Goal status: IN PROGRESS  2.  Patient will improve FOTO score by at least 5 points in order to indicate improved tolerance to activity. Baseline: 59% function Goal status: IN PROGRESS  3.  Patient will demonstrate grade of 4+/5 MMT grade in all tested musculature as evidence of improved strength to assist with stair ambulation and gait.   Baseline: see above Goal status: IN PROGRESS  4.  Patient will be able to complete 5x STS in under 11.4 seconds in order to demonstrate improved functional strength. Baseline: 12.19 seconds with UE use; Goal status: IN PROGRESS     PLAN:  PT  FREQUENCY: 2x/week  PT DURATION: 6 weeks  PLANNED INTERVENTIONS: Therapeutic exercises, Therapeutic activity, Neuromuscular re-education, Balance training, Gait training, Patient/Family education, Joint manipulation, Joint mobilization, Stair training, Orthotic/Fit training, DME instructions, Aquatic Therapy, Dry Needling, Electrical stimulation, Spinal manipulation, Spinal mobilization, Cryotherapy, Moist heat, Compression bandaging, scar mobilization, Splintting, Taping, Traction, Ultrasound, Ionotophoresis 4mg /ml Dexamethasone, and Manual therapy  PLAN FOR NEXT SESSION: postural, core and hips strength, functional strength, LLE strength.  Body mechanics education.    Reola Mosher , PT 10/09/2022, 8:19  AM

## 2022-10-13 ENCOUNTER — Encounter (HOSPITAL_COMMUNITY): Payer: Self-pay

## 2022-10-13 ENCOUNTER — Ambulatory Visit (HOSPITAL_COMMUNITY): Payer: Commercial Managed Care - PPO

## 2022-10-13 DIAGNOSIS — M6281 Muscle weakness (generalized): Secondary | ICD-10-CM

## 2022-10-13 DIAGNOSIS — R2689 Other abnormalities of gait and mobility: Secondary | ICD-10-CM

## 2022-10-13 DIAGNOSIS — M5459 Other low back pain: Secondary | ICD-10-CM | POA: Diagnosis not present

## 2022-10-13 DIAGNOSIS — R29898 Other symptoms and signs involving the musculoskeletal system: Secondary | ICD-10-CM

## 2022-10-13 NOTE — Therapy (Signed)
OUTPATIENT PHYSICAL THERAPY TREATMENT   Patient Name: [name] MRN: [id number] DOB:1966-01-01, 57 y.o., female Today's Date: 10/13/2022  END OF SESSION:  PT End of Session - 10/13/22 1449     Visit Number 4    Number of Visits 12    Date for PT Re-Evaluation 11/10/22    Authorization Type Primary Medicare Secondary: [employer]    Progress Note Due on Visit 10    PT Start Time 1450    PT Stop Time 1530    PT Time Calculation (min) 40 min    Activity Tolerance Patient tolerated treatment well    Behavior During Therapy WFL for tasks assessed/performed              Past Medical History:  Diagnosis Date   Anxiety    Bipolar disorder (HCC)    Depression    Diabetes mellitus without complication (HCC)    Type 2   Dyspnea    r/t anxiety   Encounter for neuropsychological testing    at [facility] 3/20-suggest possible somatization   Headache    Hypertension    Migraines    Pseudoseizure    Last seizure March/April 2024   PTSD (post-traumatic stress disorder)    Rosacea    Seizures (HCC)    [facility] (Dr. 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 1990s   ANTERIOR LAT LUMBAR FUSION N/A 08/03/2022   Procedure: Lumbar two-three extreme Lateral Interbody Fusion;  Surgeon: Barnett Abu, MD;  Location: [facility] OR;  Service: Neurosurgery;  Laterality: N/A;   APPENDECTOMY     BACK SURGERY     x2   CHOLECYSTECTOMY     COLONOSCOPY WITH PROPOFOL N/A 07/20/2021   Surgeon: Corbin Ade, MD;  Three 6-8 mm polyps in the descending colon and cecum removed, diverticulosis in the entire colon, otherwise normal exam.  Pathology with tubular adenomas.  Recommended 5-year surveillance.   CYST EXCISION     on ovaries   lumbar     ruptured disc in lumbar taken out   POLYPECTOMY   07/20/2021   Procedure: POLYPECTOMY;  Surgeon: Corbin Ade, MD;  Location: [facility] SUITE;  Service: Endoscopy;;   ROTATOR CUFF REPAIR Left    TONSILLECTOMY     TUBAL LIGATION     Patient Active Problem List   Diagnosis Date Noted   Pain of right hip joint 02/22/2022   Low back pain 09/21/2021   Pain in joint of left shoulder 07/19/2021   Cervical radiculopathy 07/19/2021   Elevated LFTs 07/04/2021   Fatty liver 07/04/2021   Constipation 07/04/2021   History of colonic polyps 07/04/2021   Migraine without aura and without status migrainosus, not intractable 08/24/2020   History of head injury 08/24/2020   Intractable chronic post-traumatic headache 04/20/2020   History of subarachnoid hemorrhage 04/20/2020   COVID-19 virus infection 12/05/2019   New onset type 2 diabetes mellitus (HCC) 11/12/2019   Subarachnoid hemorrhage following injury (HCC) 11/11/2019   Class 1 obesity due to excess calories with body mass index (BMI) of 34.0 to 34.9 in adult 11/11/2019   Encounter for monitoring postmenopausal estrogen replacement therapy 01/27/2019   Weight loss counseling, encounter for 01/27/2019   Vaginal odor 12/02/2018   Hot flashes due to menopause 12/02/2018   No energy 12/02/2018  Decreased libido 12/02/2018   Counseling for estrogen replacement therapy 12/02/2018   History of seizure 12/02/2018   Encounter for neuropsychological testing    Lumbar stenosis with neurogenic claudication 05/23/2018   Pain in left knee 07/26/2017   Seizures (HCC)    Psychogenic nonepileptic seizure 11/13/2015   Jerking movements of extremities 11/11/2015   Major depressive disorder, recurrent severe without psychotic features (HCC)    Bipolar II disorder (HCC) 08/30/2015   Suicide attempt by drug ingestion (HCC) 08/30/2015   Suicidal ideation 08/30/2015   Migraines 09/29/2014   Migraine 05/19/2013   Left-sided weakness 05/18/2013   Numbness and tingling of left arm and leg 05/18/2013   CVA  (cerebral infarction) 05/18/2013   Hypertension    Anxiety     PCP: Lynnea Ferrier MD  REFERRING PROVIDER: Barnett Abu, MD  REFERRING DIAG: [phone] (ICD-10-CM) - Spinal stenosis, lumbar region with neurogenic claudication  Rationale for Evaluation and Treatment: Rehabilitation  THERAPY DIAG:  Other low back pain  Other abnormalities of gait and mobility  Muscle weakness (generalized)  Other symptoms and signs involving the musculoskeletal system  ONSET DATE: DOS 08/03/22  SUBJECTIVE:                                                                                                                                                                                           SUBJECTIVE STATEMENT: Reports she had increased pain yesterday and today, unsure if the weather played a part.  Reports she timed the walking program, about 20-25 minutes.  Stated her mother has a ball, needs to be blown up some.    Evaluation: Patient states she continues to remain sore since surgery. Trouble with getting off the toilet. Legs and back are weak. Trouble getting off the floor, bed mobility, transfers. Was unable to get home health after surgery. Numbness in front of thigh since surgery. Back feels like its getting better, still pain on right side.   PERTINENT HISTORY:  initial fusion at L3-4 a subsequent fusion at L5-S1 and more recently decompression fusion for spondylolisthesis at L4-L5. And then L2-L3 on 08/03/22; bipolar disorder, depression, DM, HTN, Hx seizures, PTSD  PAIN:  Are you having pain? Yes: NPRS scale: 1-2/10 Pain location: R posterior/lateral flank Pain description: soreness Aggravating factors: walking, bending, bed mobility Relieving factors: advil  PRECAUTIONS: None  WEIGHT BEARING RESTRICTIONS: No  FALLS:  Has patient fallen in last 6 months? No  OCCUPATION: Disability  PLOF: Independent  PATIENT GOALS: get back and legs stronger  OBJECTIVE:   PATIENT SURVEYS:   FOTO 59% function  SCREENING FOR RED FLAGS: Bowel or bladder incontinence: No Spinal  tumors: No Cauda equina syndrome: No Compression fracture: No Abdominal aneurysm: No  COGNITION: Overall cognitive status: Within functional limits for tasks assessed     SENSATION: WFL Numb: L L2-L3  POSTURE: rounded shoulders, forward head, and increased thoracic kyphosis  PALPATION: TTP R QL   LUMBAR ROM:   AROM eval  Flexion 50% limited (Pain/stiff in mid -T/sp  Extension 75% limited  Right lateral flexion 25% limited  Left lateral flexion 25% limited  Right rotation 25% limited  Left rotation 25% limited   (Blank rows = not tested)  LOWER EXTREMITY ROM:   decreased hip extension ROM bilaterally   LOWER EXTREMITY MMT:    MMT Right eval Left eval  Hip flexion 4 4-  Hip extension 4- 3+  Hip abduction 4- 4-  Hip adduction    Hip internal rotation    Hip external rotation    Knee flexion 5 4+  Knee extension 5 4+  Ankle dorsiflexion 5 4+  Ankle plantarflexion    Ankle inversion    Ankle eversion     (Blank rows = not tested)  LUMBAR SPECIAL TESTS:  Femoral nerve tension test: no change in symptoms on L   FUNCTIONAL TESTS:  5 times sit to stand: 12.19 seconds with UE use; 2 minute walk test: 450 feet Stairs: unilateral UE support STS: labored with bilateral UE support; able to complete without UE support with dynamic knee valgus bilaterally  GAIT: Distance walked: 450 feet Assistive device utilized: None Level of assistance: Complete Independence Comments:  TODAY'S TREATMENT:                                                                                                                              DATE:  10/13/22: Standing: 3D hip excursion 10x (weight shift, rotate, STS, lumbar extension) 10x  Sidelying:  Clam 10x5"  Supine:  LTR 1 x 10 with 5 second holds with LE on green ball DKTC with heels on green ball 10 x 5 second hold Isometric rectus  abdominis with green ball 10x 5" Isometric obliques with green ball 10x 5" Ab sets 10x 5" Bridge 15x March with ab set 10x 5"  10/09/22 Bridge 2 x 10  Ab set 10 x 5 second holds March with ab set 2 x 10 bilateral  SLR 1 x 10 bilateral  LTR 1 x 10 with 5 second holds DKTC with heels on green ball 10 x 5 second holds Standing hip abduction 2 x 10 Standing hip extension 2 x 10  STS 2 x 10   10/02/21 Goal review   09/29/22  Eval and education   PATIENT EDUCATION:  Education details: 10/09/22: HEP, walking program progressions EVAL: Patient educated on exam findings, POC, scope of PT, HEP, and beginning a walking program/any mobility in the morning. Person educated: Patient Education method: Explanation, Demonstration, and Handouts Education comprehension: verbalized understanding, returned demonstration, verbal cues required, and tactile cues required  HOME EXERCISE  PROGRAM: Evaluation:  Beginning a walking/mobility program  Access Code: [email or web address] URL: [email or web address] Date: 10/03/2022 - Supine Transversus Abdominis Bracing - Hands on Stomach  - 2 x daily - 7 x weekly - 1 sets - 10 reps - 5 sec hold - Beginner Bridge  - 2 x daily - 7 x weekly - 1 sets - 10 reps - Supine March  - 2 x daily - 7 x weekly - 1 sets - 10 reps - Active Straight Leg Raise with Quad Set  - 2 x daily - 7 x weekly - 1 sets - 10 reps  Hip excursions for lumbar mobility 10X each direction  10/09/22 - Supine Lower Trunk Rotation  - 1 x daily - 7 x weekly - 10 reps - 5 second hold - Supine Hamstring Swiss Ball Curls/Dynamic Mobilization  - 1 x daily - 7 x weekly - 1-2 sets - 10 reps - 5 second hold - Standing Hip Abduction with Counter Support  - 1 x daily - 7 x weekly - 2 sets - 10 reps - Standing Hip Extension with Counter Support  - 1 x daily - 7 x weekly - 2 sets - 10 reps - Sit to Stand with Arms Crossed  - 1 x daily - 7 x weekly - 2 sets - 10 reps  10/13/22: - Supine Hip and Knee Flexion  AROM with Swiss Ball  - 1 x daily - 7 x weekly - 3 sets - 10 reps - Supine Lower Trunk Rotation with Swiss Ball  - 1 x daily - 7 x weekly - 3 sets - 10 reps   ASSESSMENT:  CLINICAL IMPRESSION:  Session focus with core stability and lumbar mobility exercises.  Added exercises for mobility and proximal strengthening with initial cueing for form and mechanics.  Pt tolerated well with reports of pain reduced at EOS.  Pt with theraball at home, added theraball based exercises to HEP.    OBJECTIVE IMPAIRMENTS: decreased activity tolerance, decreased balance, decreased mobility, difficulty walking, decreased ROM, decreased strength, increased muscle spasms, impaired flexibility, improper body mechanics, postural dysfunction, and pain.   ACTIVITY LIMITATIONS: carrying, lifting, bending, squatting, stairs, transfers, bed mobility, reach over head, hygiene/grooming, locomotion level, and caring for others  PARTICIPATION LIMITATIONS: meal prep, cleaning, laundry, driving, shopping, community activity, and yard work  PERSONAL FACTORS: Fitness, Past/current experiences, Time since onset of injury/illness/exacerbation, and 3+ comorbidities: 4 lumbar surgeries, bipolar disorder, depression, DM, HTN, Hx seizures, PTSD  are also affecting patient's functional outcome.   REHAB POTENTIAL: Good  CLINICAL DECISION MAKING: Stable/uncomplicated  EVALUATION COMPLEXITY: Low   GOALS: Goals reviewed with patient? Yes  SHORT TERM GOALS: Target date: 10/20/2022    Patient will be independent with HEP in order to improve functional outcomes. Baseline:  Goal status: IN PROGRESS  2.  Patient will report at least 25% improvement in symptoms for improved quality of life. Baseline:  Goal status: IN PROGRESS    LONG TERM GOALS: Target date: 11/10/2022    Patient will report at least 75% improvement in symptoms for improved quality of life. Baseline:  Goal status: IN PROGRESS  2.  Patient will improve FOTO  score by at least 5 points in order to indicate improved tolerance to activity. Baseline: 59% function Goal status: IN PROGRESS  3.  Patient will demonstrate grade of 4+/5 MMT grade in all tested musculature as evidence of improved strength to assist with stair ambulation and gait.   Baseline: see above Goal status: IN PROGRESS  4.  Patient will be able to complete 5x STS in under 11.4 seconds in order to demonstrate improved functional strength. Baseline: 12.19 seconds with UE use; Goal status: IN PROGRESS     PLAN:  PT FREQUENCY: 2x/week  PT DURATION: 6 weeks  PLANNED INTERVENTIONS: Therapeutic exercises, Therapeutic activity, Neuromuscular re-education, Balance training, Gait training, Patient/Family education, Joint manipulation, Joint mobilization, Stair training, Orthotic/Fit training, DME instructions, Aquatic Therapy, Dry Needling, Electrical stimulation, Spinal manipulation, Spinal mobilization, Cryotherapy, Moist heat, Compression bandaging, scar mobilization, Splintting, Taping, Traction, Ultrasound, Ionotophoresis 4mg /ml Dexamethasone, and Manual therapy  PLAN FOR NEXT SESSION: postural, core and hips strength, functional strength, LLE strength.  Body mechanics education.   Becky Sax, LPTA/CLT; CBIS [phone]  Juel Burrow, PTA 10/13/2022, 3:45 PM

## 2022-10-16 ENCOUNTER — Ambulatory Visit: Payer: Commercial Managed Care - PPO | Admitting: Gastroenterology

## 2022-10-16 ENCOUNTER — Encounter: Payer: Self-pay | Admitting: Gastroenterology

## 2022-10-16 VITALS — BP 121/83 | HR 77 | Temp 97.7°F | Ht 66.0 in | Wt 222.4 lb

## 2022-10-16 DIAGNOSIS — K5900 Constipation, unspecified: Secondary | ICD-10-CM

## 2022-10-16 DIAGNOSIS — K760 Fatty (change of) liver, not elsewhere classified: Secondary | ICD-10-CM

## 2022-10-16 DIAGNOSIS — R7989 Other specified abnormal findings of blood chemistry: Secondary | ICD-10-CM

## 2022-10-16 NOTE — Patient Instructions (Addendum)
For constipation, try adding a fiber supplement, 3-4 grams per day. Best options or chewables, or powder. It will take few weeks for fiber to improve your bowel function but if you are still struggling with constipation, please let me know.  Continue weight loss efforts. I will look into Trimova and will shoot you a mychart message.  We will be in touch with lab results as available.  Return office visit in one year. At that time I would recommend updating your liver ultrasound with a special test called elastography, to determine if any fibrosis from the fatty liver.

## 2022-10-16 NOTE — Progress Notes (Signed)
GI Office Note    Referring Provider: Donita Brooks, MD Primary Care Physician:  Donita Brooks, MD  Primary Gastroenterologist: Roetta Sessions, MD   Chief Complaint   Chief Complaint  Patient presents with   Follow-up    Doing well, no issues    History of Present Illness   [name] is a 56 y.o. female presenting today for follow up. Last seen in 04/2022. She has h/o chronic constipation, elevated LFTs, fatty liver, adenomatous colon polyps.   RUQ U/S 10/2020: diffuse hepatic steatosis with probable fatty sparing along gallbladder fossa.   Has struggled with weight loss efforts. Did not tolerate ozempic due to nausea. She was given phenergan which caused her to be too sleepy. Towards the end of being on ozempic she was given zofran. She felt like she could tolerate ozempic with use of zofran but has not went back to talk with her PC about restarting. In the interim she found Trimova which she has ordered and wants our opinion about it. At her lowest she got down to 204 pounds.    Since we last saw her she had back surgery. She is in PT now. May finish up in few weeks. Rarely on pain medication at this time. Has chronic constipation which did get worse on pain medication. Stools hard, has to strain.BM few times per week, bristol 1. At times very soft stools. At times stools can be large.Did use miralax while on pain medications but did not like taste. Right now not taking anything for constipation.  H/o elevated intermittently elevated alk phos, previously requested labs to rule out PBC, she completed labs today.  Colonoscopy 07/20/2021: Three 6-8 mm polyps in the descending colon and cecum removed, diverticulosis in the entire colon, otherwise normal exam. Pathology with tubular adenomas. Recommended 5-year surveillance.     Medications   Current Outpatient Medications  Medication Sig Dispense Refill   ACCU-CHEK GUIDE test strip USE AS DIRECTED TO MONITOR FSBS 1X  DAILY. DX: R73.09. 100 strip 11   amLODipine (NORVASC) 5 MG tablet Take 10 mg by mouth daily.     Blood Glucose Monitoring Suppl (BLOOD GLUCOSE SYSTEM PAK) KIT Please dispense based on patient and insurance preference. Use as directed to monitor FSBS 1x daily. Dx: R73.09. 1 kit 1   buPROPion (WELLBUTRIN XL) 150 MG 24 hr tablet TAKE 1 TABLET BY MOUTH EVERY DAY 90 tablet 2   clotrimazole-betamethasone (LOTRISONE) cream APPLY TO AFFECTED AREA TWICE A DAY 30 g 0   fluticasone (FLONASE) 50 MCG/ACT nasal spray PLACE 1 SPRAY INTO BOTH NOSTRILS 2 (TWO) TIMES DAILY 16 mL 11   gabapentin (NEURONTIN) 300 MG capsule one tid     hydrochlorothiazide (HYDRODIURIL) 25 MG tablet Take 1 tablet (25 mg total) by mouth daily. 90 tablet 3   HYDROcodone-acetaminophen (NORCO) 10-325 MG tablet Take 1 tablet by mouth every 4 (four) hours as needed for severe pain. 40 tablet 0   Lancets MISC Please dispense based on patient and insurance preference. Use as directed to monitor FSBS 1x daily. Dx: R73.09. 50 each 0   ondansetron (ZOFRAN) 4 MG tablet TAKE 1 TABLET BY MOUTH EVERY 8 HOURS AS NEEDED FOR NAUSEA AND VOMITING 20 tablet 1   ORACEA 40 MG CPDR Take 40 mg by mouth daily.     sitaGLIPtin (JANUVIA) 100 MG tablet Take 1 tablet (100 mg total) by mouth daily. 90 tablet 1   traMADol (ULTRAM) 50 MG tablet Take 50 mg  by mouth every 6 (six) hours as needed for moderate pain or severe pain.     venlafaxine XR (EFFEXOR-XR) 150 MG 24 hr capsule Take 2 capsules (300 mg total) by mouth daily with breakfast. 180 capsule 1   zonisamide (ZONEGRAN) 100 MG capsule Take 200mg  daily at bedtime 180 capsule 3   No current facility-administered medications for this visit.    Allergies   Allergies as of 10/16/2022 - Review Complete 10/16/2022  Allergen Reaction Noted   Penicillins Hives 11/14/2010   Toradol [ketorolac tromethamine] Hives 03/14/2016   Hydromorphone  07/07/2021   Lamotrigine Rash 12/23/2015        Review of Systems    General: Negative for anorexia, weight loss, fever, chills, fatigue, weakness. ENT: Negative for hoarseness, difficulty swallowing , nasal congestion. CV: Negative for chest pain, angina, palpitations, dyspnea on exertion, peripheral edema.  Respiratory: Negative for dyspnea at rest, dyspnea on exertion, cough, sputum, wheezing.  GI: See history of present illness. GU:  Negative for dysuria, hematuria, urinary incontinence, urinary frequency, nocturnal urination.  Endo: Negative for unusual weight change.     Physical Exam   BP 121/83 (BP Location: Right Arm, Patient Position: Sitting, Cuff Size: Normal)   Pulse 77   Temp 97.7 F (36.5 C) (Oral)   Ht 5\' 6"  (1.676 m)   Wt 222 lb 6.4 oz (100.9 kg)   SpO2 97%   BMI 35.90 kg/m    General: Well-nourished, well-developed in no acute distress.  Eyes: No icterus. Mouth: Oropharyngeal mucosa moist and pink   Abdomen: Bowel sounds are normal, nontender, nondistended, no hepatosplenomegaly or masses,  no abdominal bruits or hernia , no rebound or guarding.  Rectal: not performed Extremities: No lower extremity edema. No clubbing or deformities. Neuro: Alert and oriented x 4   Skin: Warm and dry, no jaundice.   Psych: Alert and cooperative, normal mood and affect.  Labs   Lab Results  Component Value Date   NA 141 07/27/2022   CL 105 07/27/2022   K 3.3 (L) 07/27/2022   CO2 27 07/27/2022   BUN 9 07/27/2022   CREATININE 0.92 07/27/2022   GFRNONAA >60 07/27/2022   CALCIUM 9.2 07/27/2022   PHOS 2.9 11/11/2019   ALBUMIN 3.3 (L) 07/27/2022   GLUCOSE 136 (H) 07/27/2022   Lab Results  Component Value Date   ALT 22 07/27/2022   AST 16 07/27/2022   ALKPHOS 127 (H) 07/27/2022   BILITOT 0.3 07/27/2022   Lab Results  Component Value Date   WBC 11.5 (H) 07/27/2022   HGB 14.4 07/27/2022   HCT 44.0 07/27/2022   MCV 88.9 07/27/2022   PLT 360 07/27/2022   Lab Results  Component Value Date   TSH 1.12 04/18/2022   Lab Results   Component Value Date   VITAMINB12 347 04/18/2022   Lab Results  Component Value Date   HGBA1C 6.2 (H) 07/27/2022    Imaging Studies   No results found.  Assessment   *Fatty liver *Elevated LFTs *Constipation  Struggles with her weight. She has crept up 8 pounds since her last ov. Likely at least in part due to back surgery and decreased physical activity plus being off ozempic.Will look at [facility] per her request.   Follow up on today's labs, recheck LFTs, rule out PBC. Given fatty liver, consider updating u/s with elastography and fibrosis score next year. Give her some time for better weight management in the meantime as well.  She did not like miralax and does  not want to add a prescription option for her constipation but her symptoms are not well managed. She is agreeable to fiber supplement.    PLAN    Fiber supplement, 3-4 grams per day. She will let me know if constipation not well managed.  Continue weight loss efforts.  Increase physical activity. Follow up pending labs.  Consider u/s with elastography and fibrosis score labs next year for fatty liver.  Return to office in one year.    Leanna Battles. Melvyn Neth, MHS, PA-C [facility]

## 2022-10-17 ENCOUNTER — Ambulatory Visit (HOSPITAL_COMMUNITY): Payer: Commercial Managed Care - PPO | Admitting: Physical Therapy

## 2022-10-17 DIAGNOSIS — R2689 Other abnormalities of gait and mobility: Secondary | ICD-10-CM

## 2022-10-17 DIAGNOSIS — R29898 Other symptoms and signs involving the musculoskeletal system: Secondary | ICD-10-CM

## 2022-10-17 DIAGNOSIS — M5459 Other low back pain: Secondary | ICD-10-CM | POA: Diagnosis not present

## 2022-10-17 DIAGNOSIS — M6281 Muscle weakness (generalized): Secondary | ICD-10-CM

## 2022-10-17 NOTE — Therapy (Signed)
OUTPATIENT PHYSICAL THERAPY TREATMENT   Patient Name: [name] MRN: [id number] DOB:1966-02-08, 57 y.o., female Today's Date: 10/17/2022  END OF SESSION:  PT End of Session - 10/17/22 0811     Visit Number 5    Number of Visits 12    Date for PT Re-Evaluation 11/10/22    Authorization Type Primary Medicare Secondary: [employer]    Progress Note Due on Visit 10    PT Start Time 0728    PT Stop Time 0806    PT Time Calculation (min) 38 min    Activity Tolerance Patient tolerated treatment well    Behavior During Therapy [facility] for tasks assessed/performed               Past Medical History:  Diagnosis Date   Anxiety    Bipolar disorder (HCC)    Depression    Diabetes mellitus without complication (HCC)    Type 2   Dyspnea    r/t anxiety   Encounter for neuropsychological testing    at [facility] 3/20-suggest possible somatization   Headache    Hypertension    Migraines    Pseudoseizure    Last seizure March/April 2024   PTSD (post-traumatic stress disorder)    Rosacea    Seizures (HCC)    [facility] (Dr. 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 1990s   ANTERIOR LAT LUMBAR FUSION N/A 08/03/2022   Procedure: Lumbar two-three extreme Lateral Interbody Fusion;  Surgeon: Barnett Abu, MD;  Location: [facility] OR;  Service: Neurosurgery;  Laterality: N/A;   APPENDECTOMY     BACK SURGERY     x2   CHOLECYSTECTOMY     COLONOSCOPY WITH PROPOFOL N/A 07/20/2021   Surgeon: Corbin Ade, MD;  Three 6-8 mm polyps in the descending colon and cecum removed, diverticulosis in the entire colon, otherwise normal exam.  Pathology with tubular adenomas.  Recommended 5-year surveillance.   CYST EXCISION     on ovaries   lumbar     ruptured disc in lumbar taken out   POLYPECTOMY   07/20/2021   Procedure: POLYPECTOMY;  Surgeon: Corbin Ade, MD;  Location: [facility] SUITE;  Service: Endoscopy;;   ROTATOR CUFF REPAIR Left    TONSILLECTOMY     TUBAL LIGATION     Patient Active Problem List   Diagnosis Date Noted   Pain of right hip joint 02/22/2022   Low back pain 09/21/2021   Pain in joint of left shoulder 07/19/2021   Cervical radiculopathy 07/19/2021   Elevated LFTs 07/04/2021   Fatty liver 07/04/2021   Constipation 07/04/2021   History of colonic polyps 07/04/2021   Migraine without aura and without status migrainosus, not intractable 08/24/2020   History of head injury 08/24/2020   Intractable chronic post-traumatic headache 04/20/2020   History of subarachnoid hemorrhage 04/20/2020   COVID-19 virus infection 12/05/2019   New onset type 2 diabetes mellitus (HCC) 11/12/2019   Subarachnoid hemorrhage following injury (HCC) 11/11/2019   Class 1 obesity due to excess calories with body mass index (BMI) of 34.0 to 34.9 in adult 11/11/2019   Encounter for monitoring postmenopausal estrogen replacement therapy 01/27/2019   Weight loss counseling, encounter for 01/27/2019   Vaginal odor 12/02/2018   Hot flashes due to menopause 12/02/2018   No energy  12/02/2018   Decreased libido 12/02/2018   Counseling for estrogen replacement therapy 12/02/2018   History of seizure 12/02/2018   Encounter for neuropsychological testing    Lumbar stenosis with neurogenic claudication 05/23/2018   Pain in left knee 07/26/2017   Seizures (HCC)    Psychogenic nonepileptic seizure 11/13/2015   Jerking movements of extremities 11/11/2015   Major depressive disorder, recurrent severe without psychotic features (HCC)    Bipolar II disorder (HCC) 08/30/2015   Suicide attempt by drug ingestion (HCC) 08/30/2015   Suicidal ideation 08/30/2015   Migraines 09/29/2014   Migraine 05/19/2013   Left-sided weakness 05/18/2013   Numbness and tingling of left arm and leg 05/18/2013   CVA  (cerebral infarction) 05/18/2013   Hypertension    Anxiety     PCP: Lynnea Ferrier MD  REFERRING PROVIDER: Barnett Abu, MD  REFERRING DIAG: [phone] (ICD-10-CM) - Spinal stenosis, lumbar region with neurogenic claudication  Rationale for Evaluation and Treatment: Rehabilitation  THERAPY DIAG:  Other low back pain  Other abnormalities of gait and mobility  Muscle weakness (generalized)  Other symptoms and signs involving the musculoskeletal system  ONSET DATE: DOS 08/03/22  SUBJECTIVE:                                                                                                                                                                                           SUBJECTIVE STATEMENT: Patient reports of increased low back pain during the night time. Patient states she does her walks in the evening then follows up with her HEP. Walks for about 45 minutes  Evaluation: Patient states she continues to remain sore since surgery. Trouble with getting off the toilet. Legs and back are weak. Trouble getting off the floor, bed mobility, transfers. Was unable to get home health after surgery. Numbness in front of thigh since surgery. Back feels like its getting better, still pain on right side.   PERTINENT HISTORY:  initial fusion at L3-4 a subsequent fusion at L5-S1 and more recently decompression fusion for spondylolisthesis at L4-L5. And then L2-L3 on 08/03/22; bipolar disorder, depression, DM, HTN, Hx seizures, PTSD  PAIN:  Are you having pain? Yes: NPRS scale: 1-2/10 Pain location: R posterior/lateral flank Pain description: soreness Aggravating factors: walking, bending, bed mobility Relieving factors: advil  PRECAUTIONS: None  WEIGHT BEARING RESTRICTIONS: No  FALLS:  Has patient fallen in last 6 months? No  OCCUPATION: Disability  PLOF: Independent  PATIENT GOALS: get back and legs stronger  OBJECTIVE:   PATIENT SURVEYS:  FOTO 59% function  SCREENING FOR RED  FLAGS: Bowel or bladder incontinence: No Spinal tumors: No Cauda equina syndrome: No  Compression fracture: No Abdominal aneurysm: No  COGNITION: Overall cognitive status: Within functional limits for tasks assessed     SENSATION: WFL Numb: L L2-L3  POSTURE: rounded shoulders, forward head, and increased thoracic kyphosis  PALPATION: TTP R QL   LUMBAR ROM:   AROM eval  Flexion 50% limited (Pain/stiff in mid -T/sp  Extension 75% limited  Right lateral flexion 25% limited  Left lateral flexion 25% limited  Right rotation 25% limited  Left rotation 25% limited   (Blank rows = not tested)  LOWER EXTREMITY ROM:   decreased hip extension ROM bilaterally   LOWER EXTREMITY MMT:    MMT Right eval Left eval  Hip flexion 4 4-  Hip extension 4- 3+  Hip abduction 4- 4-  Hip adduction    Hip internal rotation    Hip external rotation    Knee flexion 5 4+  Knee extension 5 4+  Ankle dorsiflexion 5 4+  Ankle plantarflexion    Ankle inversion    Ankle eversion     (Blank rows = not tested)  LUMBAR SPECIAL TESTS:  Femoral nerve tension test: no change in symptoms on L   FUNCTIONAL TESTS:  5 times sit to stand: 12.19 seconds with UE use; 2 minute walk test: 450 feet Stairs: unilateral UE support STS: labored with bilateral UE support; able to complete without UE support with dynamic knee valgus bilaterally  GAIT: Distance walked: 450 feet Assistive device utilized: None Level of assistance: Complete Independence Comments:  TODAY'S TREATMENT:                                                                                                                              DATE:   10/17/22 Therapeutic Exercise Supine LTR x10 each side  Supine SL bridge x10 each side  Sidelying Clamshells vs Red TB 2x10 each side  Isometric rectus abdominis with green ball 10x 3" with exhale      10/13/22: Standing: 3D hip excursion 10x (weight shift, rotate, STS, lumbar  extension) 10x  Sidelying:  Clam 10x5"  Supine:  LTR 1 x 10 with 5 second holds with LE on green ball DKTC with heels on green ball 10 x 5 second hold Isometric rectus abdominis with green ball 10x 5" Isometric obliques with green ball 10x 5" Ab sets 10x 5" Bridge 15x March with ab set 10x 5"  10/09/22 Bridge 2 x 10  Ab set 10 x 5 second holds March with ab set 2 x 10 bilateral  SLR 1 x 10 bilateral  LTR 1 x 10 with 5 second holds DKTC with heels on green ball 10 x 5 second holds Standing hip abduction 2 x 10 Standing hip extension 2 x 10  STS 2 x 10   10/02/21 Goal review   09/29/22  Eval and education   PATIENT EDUCATION:  Education details: 10/09/22: HEP, walking program progressions EVAL: Patient educated on exam findings, POC, scope of PT,  HEP, and beginning a walking program/any mobility in the morning. Person educated: Patient Education method: Explanation, Demonstration, and Handouts Education comprehension: verbalized understanding, returned demonstration, verbal cues required, and tactile cues required  HOME EXERCISE PROGRAM: Evaluation:  Beginning a walking/mobility program  Access Code: [email or web address] URL: [email or web address] Date: 10/03/2022 - Supine Transversus Abdominis Bracing - Hands on Stomach  - 2 x daily - 7 x weekly - 1 sets - 10 reps - 5 sec hold - Beginner Bridge  - 2 x daily - 7 x weekly - 1 sets - 10 reps - Supine March  - 2 x daily - 7 x weekly - 1 sets - 10 reps - Active Straight Leg Raise with Quad Set  - 2 x daily - 7 x weekly - 1 sets - 10 reps  Hip excursions for lumbar mobility 10X each direction  10/09/22 - Supine Lower Trunk Rotation  - 1 x daily - 7 x weekly - 10 reps - 5 second hold - Supine Hamstring Swiss Ball Curls/Dynamic Mobilization  - 1 x daily - 7 x weekly - 1-2 sets - 10 reps - 5 second hold - Standing Hip Abduction with Counter Support  - 1 x daily - 7 x weekly - 2 sets - 10 reps - Standing Hip Extension with  Counter Support  - 1 x daily - 7 x weekly - 2 sets - 10 reps - Sit to Stand with Arms Crossed  - 1 x daily - 7 x weekly - 2 sets - 10 reps  10/13/22: - Supine Hip and Knee Flexion AROM with Swiss Ball  - 1 x daily - 7 x weekly - 3 sets - 10 reps - Supine Lower Trunk Rotation with Swiss Ball  - 1 x daily - 7 x weekly - 3 sets - 10 reps  10/17/22 - Clamshell with Resistance  - 1 x daily - 7 x weekly - 2 sets - 10 reps (added to HEP today)  ASSESSMENT:  CLINICAL IMPRESSION:  Session focused on adding difficulty to current exercise routine. PT trialed both single leg bridges and sidelying clamshells with resistance. Patient tolerated session well. PT added sidelying clamshells to current HEP.    OBJECTIVE IMPAIRMENTS: decreased activity tolerance, decreased balance, decreased mobility, difficulty walking, decreased ROM, decreased strength, increased muscle spasms, impaired flexibility, improper body mechanics, postural dysfunction, and pain.   ACTIVITY LIMITATIONS: carrying, lifting, bending, squatting, stairs, transfers, bed mobility, reach over head, hygiene/grooming, locomotion level, and caring for others  PARTICIPATION LIMITATIONS: meal prep, cleaning, laundry, driving, shopping, community activity, and yard work  PERSONAL FACTORS: Fitness, Past/current experiences, Time since onset of injury/illness/exacerbation, and 3+ comorbidities: 4 lumbar surgeries, bipolar disorder, depression, DM, HTN, Hx seizures, PTSD  are also affecting patient's functional outcome.   REHAB POTENTIAL: Good  CLINICAL DECISION MAKING: Stable/uncomplicated  EVALUATION COMPLEXITY: Low   GOALS: Goals reviewed with patient? Yes  SHORT TERM GOALS: Target date: 10/20/2022    Patient will be independent with HEP in order to improve functional outcomes. Baseline:  Goal status: IN PROGRESS  2.  Patient will report at least 25% improvement in symptoms for improved quality of life. Baseline:  Goal status: IN  PROGRESS    LONG TERM GOALS: Target date: 11/10/2022    Patient will report at least 75% improvement in symptoms for improved quality of life. Baseline:  Goal status: IN PROGRESS  2.  Patient will improve FOTO score by at least 5 points in order to indicate improved tolerance to activity. Baseline:  59% function Goal status: IN PROGRESS  3.  Patient will demonstrate grade of 4+/5 MMT grade in all tested musculature as evidence of improved strength to assist with stair ambulation and gait.   Baseline: see above Goal status: IN PROGRESS  4.  Patient will be able to complete 5x STS in under 11.4 seconds in order to demonstrate improved functional strength. Baseline: 12.19 seconds with UE use; Goal status: IN PROGRESS     PLAN:  PT FREQUENCY: 2x/week  PT DURATION: 6 weeks  PLANNED INTERVENTIONS: Therapeutic exercises, Therapeutic activity, Neuromuscular re-education, Balance training, Gait training, Patient/Family education, Joint manipulation, Joint mobilization, Stair training, Orthotic/Fit training, DME instructions, Aquatic Therapy, Dry Needling, Electrical stimulation, Spinal manipulation, Spinal mobilization, Cryotherapy, Moist heat, Compression bandaging, scar mobilization, Splintting, Taping, Traction, Ultrasound, Ionotophoresis 4mg /ml Dexamethasone, and Manual therapy  PLAN FOR NEXT SESSION: Update and modify HEP based on what patient can tolerate   Seymour Bars, PT 10/17/2022, 8:19 AM

## 2022-10-20 ENCOUNTER — Encounter (HOSPITAL_COMMUNITY): Payer: Medicare Other

## 2022-10-24 ENCOUNTER — Encounter (HOSPITAL_COMMUNITY): Payer: Medicare Other | Admitting: Physical Therapy

## 2022-10-26 ENCOUNTER — Encounter (HOSPITAL_COMMUNITY): Payer: Medicare Other | Admitting: Physical Therapy

## 2022-10-27 ENCOUNTER — Encounter (HOSPITAL_COMMUNITY): Payer: Self-pay

## 2022-10-27 ENCOUNTER — Ambulatory Visit (HOSPITAL_COMMUNITY): Payer: Commercial Managed Care - PPO

## 2022-10-27 DIAGNOSIS — M6281 Muscle weakness (generalized): Secondary | ICD-10-CM

## 2022-10-27 DIAGNOSIS — R2689 Other abnormalities of gait and mobility: Secondary | ICD-10-CM

## 2022-10-27 DIAGNOSIS — M5459 Other low back pain: Secondary | ICD-10-CM

## 2022-10-27 DIAGNOSIS — R29898 Other symptoms and signs involving the musculoskeletal system: Secondary | ICD-10-CM

## 2022-10-27 NOTE — Therapy (Signed)
OUTPATIENT PHYSICAL THERAPY TREATMENT   Patient Name: [name] MRN: [id number] DOB:02-04-1966, 57 y.o., female Today's Date: 10/27/2022  END OF SESSION:  PT End of Session - 10/27/22 0813     Visit Number 6    Number of Visits 12    Date for PT Re-Evaluation 11/10/22    Authorization Type Primary Medicare Secondary: [employer]    Progress Note Due on Visit 10    PT Start Time 0814    PT Stop Time 0855    PT Time Calculation (min) 41 min    Activity Tolerance Patient tolerated treatment well    Behavior During Therapy [facility] for tasks assessed/performed               Past Medical History:  Diagnosis Date   Anxiety    Bipolar disorder (HCC)    Depression    Diabetes mellitus without complication (HCC)    Type 2   Dyspnea    r/t anxiety   Encounter for neuropsychological testing    at [facility] 3/20-suggest possible somatization   Headache    Hypertension    Migraines    Pseudoseizure    Last seizure March/April 2024   PTSD (post-traumatic stress disorder)    Rosacea    Seizures (HCC)    [facility] (Dr. 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 1990s   ANTERIOR LAT LUMBAR FUSION N/A 08/03/2022   Procedure: Lumbar two-three extreme Lateral Interbody Fusion;  Surgeon: Barnett Abu, MD;  Location: [facility] OR;  Service: Neurosurgery;  Laterality: N/A;   APPENDECTOMY     BACK SURGERY     x2   CHOLECYSTECTOMY     COLONOSCOPY WITH PROPOFOL N/A 07/20/2021   Surgeon: Corbin Ade, MD;  Three 6-8 mm polyps in the descending colon and cecum removed, diverticulosis in the entire colon, otherwise normal exam.  Pathology with tubular adenomas.  Recommended 5-year surveillance.   CYST EXCISION     on ovaries   lumbar     ruptured disc in lumbar taken out   POLYPECTOMY   07/20/2021   Procedure: POLYPECTOMY;  Surgeon: Corbin Ade, MD;  Location: [facility] SUITE;  Service: Endoscopy;;   ROTATOR CUFF REPAIR Left    TONSILLECTOMY     TUBAL LIGATION     Patient Active Problem List   Diagnosis Date Noted   Pain of right hip joint 02/22/2022   Low back pain 09/21/2021   Pain in joint of left shoulder 07/19/2021   Cervical radiculopathy 07/19/2021   Elevated LFTs 07/04/2021   Fatty liver 07/04/2021   Constipation 07/04/2021   History of colonic polyps 07/04/2021   Migraine without aura and without status migrainosus, not intractable 08/24/2020   History of head injury 08/24/2020   Intractable chronic post-traumatic headache 04/20/2020   History of subarachnoid hemorrhage 04/20/2020   COVID-19 virus infection 12/05/2019   New onset type 2 diabetes mellitus (HCC) 11/12/2019   Subarachnoid hemorrhage following injury (HCC) 11/11/2019   Class 1 obesity due to excess calories with body mass index (BMI) of 34.0 to 34.9 in adult 11/11/2019   Encounter for monitoring postmenopausal estrogen replacement therapy 01/27/2019   Weight loss counseling, encounter for 01/27/2019   Vaginal odor 12/02/2018   Hot flashes due to menopause 12/02/2018   No energy  12/02/2018   Decreased libido 12/02/2018   Counseling for estrogen replacement therapy 12/02/2018   History of seizure 12/02/2018   Encounter for neuropsychological testing    Lumbar stenosis with neurogenic claudication 05/23/2018   Pain in left knee 07/26/2017   Seizures (HCC)    Psychogenic nonepileptic seizure 11/13/2015   Jerking movements of extremities 11/11/2015   Major depressive disorder, recurrent severe without psychotic features (HCC)    Bipolar II disorder (HCC) 08/30/2015   Suicide attempt by drug ingestion (HCC) 08/30/2015   Suicidal ideation 08/30/2015   Migraines 09/29/2014   Migraine 05/19/2013   Left-sided weakness 05/18/2013   Numbness and tingling of left arm and leg 05/18/2013   CVA  (cerebral infarction) 05/18/2013   Hypertension    Anxiety     PCP: Lynnea Ferrier MD  REFERRING PROVIDER: Barnett Abu, MD  REFERRING DIAG: [phone] (ICD-10-CM) - Spinal stenosis, lumbar region with neurogenic claudication  Rationale for Evaluation and Treatment: Rehabilitation  THERAPY DIAG:  Other low back pain  Other abnormalities of gait and mobility  Muscle weakness (generalized)  Other symptoms and signs involving the musculoskeletal system  ONSET DATE: DOS 08/03/22  SUBJECTIVE:                                                                                                                                                                                           SUBJECTIVE STATEMENT: Pt stated she has had a sinus headache and reports a decrease in HEP compliance.  Has been walking shorter amounts this week.  Reports increased ease getting off toilet.  Stated her mother owns a theraball, plans to get soon.    Evaluation: Patient states she continues to remain sore since surgery. Trouble with getting off the toilet. Legs and back are weak. Trouble getting off the floor, bed mobility, transfers. Was unable to get home health after surgery. Numbness in front of thigh since surgery. Back feels like its getting better, still pain on right side.   PERTINENT HISTORY:  initial fusion at L3-4 a subsequent fusion at L5-S1 and more recently decompression fusion for spondylolisthesis at L4-L5. And then L2-L3 on 08/03/22; bipolar disorder, depression, DM, HTN, Hx seizures, PTSD  PAIN:  Are you having pain? Yes: NPRS scale: 1-2/10 Pain location: R posterior/lateral flank Pain description: soreness Aggravating factors: walking, bending, bed mobility Relieving factors: advil  PRECAUTIONS: None  WEIGHT BEARING RESTRICTIONS: No  FALLS:  Has patient fallen in last 6 months? No  OCCUPATION: Disability  PLOF: Independent  PATIENT GOALS: get back and legs stronger  OBJECTIVE:    PATIENT SURVEYS:  FOTO 59% function  SCREENING FOR RED FLAGS:  Bowel or bladder incontinence: No Spinal tumors: No Cauda equina syndrome: No Compression fracture: No Abdominal aneurysm: No  COGNITION: Overall cognitive status: Within functional limits for tasks assessed     SENSATION: WFL Numb: L L2-L3  POSTURE: rounded shoulders, forward head, and increased thoracic kyphosis  PALPATION: TTP R QL   LUMBAR ROM:   AROM eval  Flexion 50% limited (Pain/stiff in mid -T/sp  Extension 75% limited  Right lateral flexion 25% limited  Left lateral flexion 25% limited  Right rotation 25% limited  Left rotation 25% limited   (Blank rows = not tested)  LOWER EXTREMITY ROM:   decreased hip extension ROM bilaterally   LOWER EXTREMITY MMT:    MMT Right eval Left eval  Hip flexion 4 4-  Hip extension 4- 3+  Hip abduction 4- 4-  Hip adduction    Hip internal rotation    Hip external rotation    Knee flexion 5 4+  Knee extension 5 4+  Ankle dorsiflexion 5 4+  Ankle plantarflexion    Ankle inversion    Ankle eversion     (Blank rows = not tested)  LUMBAR SPECIAL TESTS:  Femoral nerve tension test: no change in symptoms on L   FUNCTIONAL TESTS:  5 times sit to stand: 12.19 seconds with UE use; 2 minute walk test: 450 feet Stairs: unilateral UE support STS: labored with bilateral UE support; able to complete without UE support with dynamic knee valgus bilaterally  GAIT: Distance walked: 450 feet Assistive device utilized: None Level of assistance: Complete Independence Comments:  TODAY'S TREATMENT:                                                                                                                              DATE: 10/27/22  3D hip excursion (weight shift, rotate, STS eccentric control, extension) Heel raise 10x 3" March 10x 3" alternating with ab set.    Supine: Supine SL bridge x10 each side Feet on theraball heel slide with ab set Bridge  with feet on theraball 10x 5" Isometric rectus abdominis with green ball 10x 5" Isometric obliques with green ball 10x 5"  Sidelying: Hip abduction 10x each  Seated: Theraball posture Ankle pumps Weight shifting Bouncing  Marching   10/17/22 Therapeutic Exercise Supine LTR x10 each side  Supine SL bridge x10 each side  Sidelying Clamshells vs Red TB 2x10 each side  Isometric rectus abdominis with green ball 10x 3" with exhale      10/13/22: Standing: 3D hip excursion 10x (weight shift, rotate, STS, lumbar extension) 10x  Sidelying:  Clam 10x5"  Supine:  LTR 1 x 10 with 5 second holds with LE on green ball DKTC with heels on green ball 10 x 5 second hold Isometric rectus abdominis with green ball 10x 5" Isometric obliques with green ball 10x 5" Ab sets 10x 5" Bridge 15x March with ab set 10x 5"  10/09/22 Bridge 2 x 10  Ab set 10 x 5 second holds March with ab set 2 x 10 bilateral  SLR 1 x 10 bilateral  LTR 1 x 10 with 5 second holds DKTC with heels on green ball 10 x 5 second holds Standing hip abduction 2 x 10 Standing hip extension 2 x 10  STS 2 x 10   10/02/21 Goal review   09/29/22  Eval and education   PATIENT EDUCATION:  Education details: 10/09/22: HEP, walking program progressions EVAL: Patient educated on exam findings, POC, scope of PT, HEP, and beginning a walking program/any mobility in the morning. Person educated: Patient Education method: Explanation, Demonstration, and Handouts Education comprehension: verbalized understanding, returned demonstration, verbal cues required, and tactile cues required  HOME EXERCISE PROGRAM: Evaluation:  Beginning a walking/mobility program  Access Code: [email or web address] URL: [email or web address] Date: 10/03/2022 - Supine Transversus Abdominis Bracing - Hands on Stomach  - 2 x daily - 7 x weekly - 1 sets - 10 reps - 5 sec hold - Beginner Bridge  - 2 x daily - 7 x weekly - 1 sets - 10 reps - Supine  March  - 2 x daily - 7 x weekly - 1 sets - 10 reps - Active Straight Leg Raise with Quad Set  - 2 x daily - 7 x weekly - 1 sets - 10 reps  Hip excursions for lumbar mobility 10X each direction  10/09/22 - Supine Lower Trunk Rotation  - 1 x daily - 7 x weekly - 10 reps - 5 second hold - Supine Hamstring Swiss Ball Curls/Dynamic Mobilization  - 1 x daily - 7 x weekly - 1-2 sets - 10 reps - 5 second hold - Standing Hip Abduction with Counter Support  - 1 x daily - 7 x weekly - 2 sets - 10 reps - Standing Hip Extension with Counter Support  - 1 x daily - 7 x weekly - 2 sets - 10 reps - Sit to Stand with Arms Crossed  - 1 x daily - 7 x weekly - 2 sets - 10 reps  10/13/22: - Supine Hip and Knee Flexion AROM with Swiss Ball  - 1 x daily - 7 x weekly - 3 sets - 10 reps - Supine Lower Trunk Rotation with Swiss Ball  - 1 x daily - 7 x weekly - 3 sets - 10 reps  10/17/22 - Clamshell with Resistance  - 1 x daily - 7 x weekly - 2 sets - 10 reps (added to HEP today)  ASSESSMENT:  CLINICAL IMPRESSION: Session focus with core and proximal strengthening that was tolerated well.  Added theraball based activities as pt reports she owns one and interested in additional exercises.  Pt tolerated well with no reports of pain through session.  OBJECTIVE IMPAIRMENTS: decreased activity tolerance, decreased balance, decreased mobility, difficulty walking, decreased ROM, decreased strength, increased muscle spasms, impaired flexibility, improper body mechanics, postural dysfunction, and pain.   ACTIVITY LIMITATIONS: carrying, lifting, bending, squatting, stairs, transfers, bed mobility, reach over head, hygiene/grooming, locomotion level, and caring for others  PARTICIPATION LIMITATIONS: meal prep, cleaning, laundry, driving, shopping, community activity, and yard work  PERSONAL FACTORS: Fitness, Past/current experiences, Time since onset of injury/illness/exacerbation, and 3+ comorbidities: 4 lumbar surgeries, bipolar  disorder, depression, DM, HTN, Hx seizures, PTSD  are also affecting patient's functional outcome.   REHAB POTENTIAL: Good  CLINICAL DECISION MAKING: Stable/uncomplicated  EVALUATION COMPLEXITY: Low   GOALS: Goals reviewed with patient? Yes  SHORT TERM GOALS: Target date: 10/20/2022  Patient will be independent with HEP in order to improve functional outcomes. Baseline:  Goal status: IN PROGRESS  2.  Patient will report at least 25% improvement in symptoms for improved quality of life. Baseline:  Goal status: IN PROGRESS    LONG TERM GOALS: Target date: 11/10/2022    Patient will report at least 75% improvement in symptoms for improved quality of life. Baseline:  Goal status: IN PROGRESS  2.  Patient will improve FOTO score by at least 5 points in order to indicate improved tolerance to activity. Baseline: 59% function Goal status: IN PROGRESS  3.  Patient will demonstrate grade of 4+/5 MMT grade in all tested musculature as evidence of improved strength to assist with stair ambulation and gait.   Baseline: see above Goal status: IN PROGRESS  4.  Patient will be able to complete 5x STS in under 11.4 seconds in order to demonstrate improved functional strength. Baseline: 12.19 seconds with UE use; Goal status: IN PROGRESS     PLAN:  PT FREQUENCY: 2x/week  PT DURATION: 6 weeks  PLANNED INTERVENTIONS: Therapeutic exercises, Therapeutic activity, Neuromuscular re-education, Balance training, Gait training, Patient/Family education, Joint manipulation, Joint mobilization, Stair training, Orthotic/Fit training, DME instructions, Aquatic Therapy, Dry Needling, Electrical stimulation, Spinal manipulation, Spinal mobilization, Cryotherapy, Moist heat, Compression bandaging, scar mobilization, Splintting, Taping, Traction, Ultrasound, Ionotophoresis 4mg /ml Dexamethasone, and Manual therapy  PLAN FOR NEXT SESSION: Update and modify HEP based on what patient can  tolerate  Becky Sax, LPTA/CLT; CBIS [phone]  Juel Burrow, PTA 10/27/2022, 11:22 AM

## 2022-10-30 ENCOUNTER — Ambulatory Visit (HOSPITAL_COMMUNITY): Payer: Commercial Managed Care - PPO

## 2022-10-30 DIAGNOSIS — M5459 Other low back pain: Secondary | ICD-10-CM

## 2022-10-30 DIAGNOSIS — R2689 Other abnormalities of gait and mobility: Secondary | ICD-10-CM

## 2022-10-30 DIAGNOSIS — R29898 Other symptoms and signs involving the musculoskeletal system: Secondary | ICD-10-CM

## 2022-10-30 NOTE — Therapy (Signed)
OUTPATIENT PHYSICAL THERAPY TREATMENT   Patient Name: [name] MRN: [id number] DOB:1965/05/20, 57 y.o., female Today's Date: 10/30/2022  END OF SESSION:  PT End of Session - 10/30/22 0825     Visit Number 7    Number of Visits 12    Date for PT Re-Evaluation 11/10/22    Authorization Type Primary Medicare Secondary: [employer]    Progress Note Due on Visit 10    PT Start Time 0820    PT Stop Time 0900    PT Time Calculation (min) 40 min    Activity Tolerance Patient tolerated treatment well    Behavior During Therapy WFL for tasks assessed/performed                Past Medical History:  Diagnosis Date   Anxiety    Bipolar disorder (HCC)    Depression    Diabetes mellitus without complication (HCC)    Type 2   Dyspnea    r/t anxiety   Encounter for neuropsychological testing    at [facility] 3/20-suggest possible somatization   Headache    Hypertension    Migraines    Pseudoseizure    Last seizure March/April 2024   PTSD (post-traumatic stress disorder)    Rosacea    Seizures (HCC)    [facility] (Dr. 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 1990s   ANTERIOR LAT LUMBAR FUSION N/A 08/03/2022   Procedure: Lumbar two-three extreme Lateral Interbody Fusion;  Surgeon: Barnett Abu, MD;  Location: [facility] OR;  Service: Neurosurgery;  Laterality: N/A;   APPENDECTOMY     BACK SURGERY     x2   CHOLECYSTECTOMY     COLONOSCOPY WITH PROPOFOL N/A 07/20/2021   Surgeon: Corbin Ade, MD;  Three 6-8 mm polyps in the descending colon and cecum removed, diverticulosis in the entire colon, otherwise normal exam.  Pathology with tubular adenomas.  Recommended 5-year surveillance.   CYST EXCISION     on ovaries   lumbar     ruptured disc in lumbar taken out   POLYPECTOMY   07/20/2021   Procedure: POLYPECTOMY;  Surgeon: Corbin Ade, MD;  Location: [facility];  Service: Endoscopy;;   ROTATOR CUFF REPAIR Left    TONSILLECTOMY     TUBAL LIGATION     Patient Active Problem List   Diagnosis Date Noted   Pain of right hip joint 02/22/2022   Low back pain 09/21/2021   Pain in joint of left shoulder 07/19/2021   Cervical radiculopathy 07/19/2021   Elevated LFTs 07/04/2021   Fatty liver 07/04/2021   Constipation 07/04/2021   History of colonic polyps 07/04/2021   Migraine without aura and without status migrainosus, not intractable 08/24/2020   History of head injury 08/24/2020   Intractable chronic post-traumatic headache 04/20/2020   History of subarachnoid hemorrhage 04/20/2020   COVID-19 virus infection 12/05/2019   New onset type 2 diabetes mellitus (HCC) 11/12/2019   Subarachnoid hemorrhage following injury (HCC) 11/11/2019   Class 1 obesity due to excess calories with body mass index (BMI) of 34.0 to 34.9 in adult 11/11/2019   Encounter for monitoring postmenopausal estrogen replacement therapy 01/27/2019   Weight loss counseling, encounter for 01/27/2019   Vaginal odor 12/02/2018   Hot flashes due to menopause 12/02/2018   No  energy 12/02/2018   Decreased libido 12/02/2018   Counseling for estrogen replacement therapy 12/02/2018   History of seizure 12/02/2018   Encounter for neuropsychological testing    Lumbar stenosis with neurogenic claudication 05/23/2018   Pain in left knee 07/26/2017   Seizures (HCC)    Psychogenic nonepileptic seizure 11/13/2015   Jerking movements of extremities 11/11/2015   Major depressive disorder, recurrent severe without psychotic features (HCC)    Bipolar II disorder (HCC) 08/30/2015   Suicide attempt by drug ingestion (HCC) 08/30/2015   Suicidal ideation 08/30/2015   Migraines 09/29/2014   Migraine 05/19/2013   Left-sided weakness 05/18/2013   Numbness and tingling of left arm and leg 05/18/2013   CVA  (cerebral infarction) 05/18/2013   Hypertension    Anxiety     PCP: Lynnea Ferrier MD  REFERRING PROVIDER: Barnett Abu, MD  REFERRING DIAG: [phone] (ICD-10-CM) - Spinal stenosis, lumbar region with neurogenic claudication  Rationale for Evaluation and Treatment: Rehabilitation  THERAPY DIAG:  No diagnosis found.  ONSET DATE: DOS 08/03/22  SUBJECTIVE:                                                                                                                                                                                           SUBJECTIVE STATEMENT: Pt stated she has had a sinus headache and reports a decrease in HEP compliance. Patient also states her HEP has become "easy"     Evaluation: Patient states she continues to remain sore since surgery. Trouble with getting off the toilet. Legs and back are weak. Trouble getting off the floor, bed mobility, transfers. Was unable to get home health after surgery. Numbness in front of thigh since surgery. Back feels like its getting better, still pain on right side.   PERTINENT HISTORY:  initial fusion at L3-4 a subsequent fusion at L5-S1 and more recently decompression fusion for spondylolisthesis at L4-L5. And then L2-L3 on 08/03/22; bipolar disorder, depression, DM, HTN, Hx seizures, PTSD  PAIN:  Are you having pain? Yes: NPRS scale: 1-2/10 Pain location: R posterior/lateral flank Pain description: soreness Aggravating factors: walking, bending, bed mobility Relieving factors: advil  PRECAUTIONS: None  WEIGHT BEARING RESTRICTIONS: No  FALLS:  Has patient fallen in last 6 months? No  OCCUPATION: Disability  PLOF: Independent  PATIENT GOALS: get back and legs stronger  OBJECTIVE:   PATIENT SURVEYS:  FOTO 59% function  SCREENING FOR RED FLAGS: Bowel or bladder incontinence: No Spinal tumors: No Cauda equina syndrome: No Compression fracture: No Abdominal aneurysm: No  COGNITION: Overall cognitive status:  Within functional limits for tasks assessed     SENSATION: WFL Numb: L  L2-L3  POSTURE: rounded shoulders, forward head, and increased thoracic kyphosis  PALPATION: TTP R QL   LUMBAR ROM:   AROM eval  Flexion 50% limited (Pain/stiff in mid -T/sp  Extension 75% limited  Right lateral flexion 25% limited  Left lateral flexion 25% limited  Right rotation 25% limited  Left rotation 25% limited   (Blank rows = not tested)  LOWER EXTREMITY ROM:   decreased hip extension ROM bilaterally   LOWER EXTREMITY MMT:    MMT Right eval Left eval  Hip flexion 4 4-  Hip extension 4- 3+  Hip abduction 4- 4-  Hip adduction    Hip internal rotation    Hip external rotation    Knee flexion 5 4+  Knee extension 5 4+  Ankle dorsiflexion 5 4+  Ankle plantarflexion    Ankle inversion    Ankle eversion     (Blank rows = not tested)  LUMBAR SPECIAL TESTS:  Femoral nerve tension test: no change in symptoms on L   FUNCTIONAL TESTS:  5 times sit to stand: 12.19 seconds with UE use; 2 minute walk test: 450 feet Stairs: unilateral UE support STS: labored with bilateral UE support; able to complete without UE support with dynamic knee valgus bilaterally  GAIT: Distance walked: 450 feet Assistive device utilized: None Level of assistance: Complete Independence Comments:  TODAY'S TREATMENT:                                                                                                                              DATE: 10/30/22   Therapeutic exercise x72min - Figure 4 Bridge  20 reps - Clamshell with Resistance  20 reps  Neuromuscular re-education x41min - Side Plank on Knees  10 reps - 3" hold - Standard Plank 10 reps - 3" hold   PATIENT EDUCATION:  Education details: 10/09/22: HEP, walking program progressions EVAL: Patient educated on exam findings, POC, scope of PT, HEP, and beginning a walking program/any mobility in the morning. Person educated: Patient Education  method: Explanation, Demonstration, and Handouts Education comprehension: verbalized understanding, returned demonstration, verbal cues required, and tactile cues required  HOME EXERCISE PROGRAM: Access Code: [email or web address] URL: [email or web address] Date: 10/30/2022 Prepared by: Seymour Bars  Exercises(Updated 10/30/22) - Supine Lower Trunk Rotation  - 1 x daily - 7 x weekly - 10 reps - Supine March  - 1 x daily - 7 x weekly - 10 reps - Figure 4 Bridge  - 1 x daily - 7 x weekly - 20 reps - Clamshell with Resistance  - 1 x daily - 7 x weekly - 20 reps - Side Plank on Knees  - 1 x daily - 7 x weekly - 10 reps - 3" hold - Standard Plank  - 1 x daily - 7 x weekly - 10 reps - 3" hold  ASSESSMENT:  CLINICAL IMPRESSION: Session was focused on updating pt's HEP to progress into lateral/rotational core/lumbar stability. PT introduced plank  variations as well as removed basic HEP exercises. Pt requires minimal verbal/visual cues on proper form during planks. Pt tolerated well with no reports of pain through session.  OBJECTIVE IMPAIRMENTS: decreased activity tolerance, decreased balance, decreased mobility, difficulty walking, decreased ROM, decreased strength, increased muscle spasms, impaired flexibility, improper body mechanics, postural dysfunction, and pain.   ACTIVITY LIMITATIONS: carrying, lifting, bending, squatting, stairs, transfers, bed mobility, reach over head, hygiene/grooming, locomotion level, and caring for others  PARTICIPATION LIMITATIONS: meal prep, cleaning, laundry, driving, shopping, community activity, and yard work  PERSONAL FACTORS: Fitness, Past/current experiences, Time since onset of injury/illness/exacerbation, and 3+ comorbidities: 4 lumbar surgeries, bipolar disorder, depression, DM, HTN, Hx seizures, PTSD  are also affecting patient's functional outcome.   REHAB POTENTIAL: Good  CLINICAL DECISION MAKING: Stable/uncomplicated  EVALUATION COMPLEXITY:  Low   GOALS: Goals reviewed with patient? Yes  SHORT TERM GOALS: Target date: 10/20/2022    Patient will be independent with HEP in order to improve functional outcomes. Baseline:  Goal status: IN PROGRESS  2.  Patient will report at least 25% improvement in symptoms for improved quality of life. Baseline:  Goal status: IN PROGRESS    LONG TERM GOALS: Target date: 11/10/2022    Patient will report at least 75% improvement in symptoms for improved quality of life. Baseline:  Goal status: IN PROGRESS  2.  Patient will improve FOTO score by at least 5 points in order to indicate improved tolerance to activity. Baseline: 59% function Goal status: IN PROGRESS  3.  Patient will demonstrate grade of 4+/5 MMT grade in all tested musculature as evidence of improved strength to assist with stair ambulation and gait.   Baseline: see above Goal status: IN PROGRESS  4.  Patient will be able to complete 5x STS in under 11.4 seconds in order to demonstrate improved functional strength. Baseline: 12.19 seconds with UE use; Goal status: IN PROGRESS     PLAN:  PT FREQUENCY: 2x/week  PT DURATION: 6 weeks  PLANNED INTERVENTIONS: Therapeutic exercises, Therapeutic activity, Neuromuscular re-education, Balance training, Gait training, Patient/Family education, Joint manipulation, Joint mobilization, Stair training, Orthotic/Fit training, DME instructions, Aquatic Therapy, Dry Needling, Electrical stimulation, Spinal manipulation, Spinal mobilization, Cryotherapy, Moist heat, Compression bandaging, scar mobilization, Splintting, Taping, Traction, Ultrasound, Ionotophoresis 4mg /ml Dexamethasone, and Manual therapy  PLAN FOR NEXT SESSION: Review HEP to confirm patient is tolerating new exercises well     Seymour Bars, PT 10/30/2022, 8:25 AM

## 2022-11-01 ENCOUNTER — Other Ambulatory Visit: Payer: Self-pay

## 2022-11-01 DIAGNOSIS — R7989 Other specified abnormal findings of blood chemistry: Secondary | ICD-10-CM

## 2022-11-01 DIAGNOSIS — K760 Fatty (change of) liver, not elsewhere classified: Secondary | ICD-10-CM

## 2022-11-03 ENCOUNTER — Encounter (HOSPITAL_COMMUNITY): Payer: Medicare Other | Admitting: Physical Therapy

## 2022-11-07 ENCOUNTER — Telehealth: Payer: Self-pay | Admitting: Family Medicine

## 2022-11-07 ENCOUNTER — Encounter (HOSPITAL_COMMUNITY): Payer: Medicare Other | Admitting: Physical Therapy

## 2022-11-07 ENCOUNTER — Other Ambulatory Visit: Payer: Self-pay

## 2022-11-07 DIAGNOSIS — F419 Anxiety disorder, unspecified: Secondary | ICD-10-CM

## 2022-11-07 MED ORDER — VENLAFAXINE HCL ER 150 MG PO CP24
300.0000 mg | ORAL_CAPSULE | Freq: Every day | ORAL | 1 refills | Status: DC
Start: 2022-11-07 — End: 2023-08-31

## 2022-11-07 NOTE — Telephone Encounter (Signed)
Prescription Request  11/07/2022  LOV: 04/18/2022  What is the name of the medication or equipment?   venlafaxine XR (EFFEXOR-XR) 150 MG 24 hr capsule  **90 day script requested**  Have you contacted your pharmacy to request a refill? Yes   Which pharmacy would you like this sent to?  [id number] - [place], [place] - [address] AT [facility] [place] [facility] [address] [place] [address] Phone: [phone] Fax: [phone]    Patient notified that their request is being sent to the clinical staff for review and that they should receive a response within 2 business days.   Please advise pharmacist.

## 2022-11-09 ENCOUNTER — Encounter (HOSPITAL_COMMUNITY): Payer: Medicare Other | Admitting: Physical Therapy

## 2022-11-09 ENCOUNTER — Ambulatory Visit
Admission: EM | Admit: 2022-11-09 | Discharge: 2022-11-09 | Disposition: A | Discharge status: Home / Self Care | Payer: Commercial Managed Care - PPO | Attending: Nurse Practitioner | Admitting: Nurse Practitioner

## 2022-11-09 ENCOUNTER — Encounter: Payer: Self-pay | Admitting: Emergency Medicine

## 2022-11-09 ENCOUNTER — Other Ambulatory Visit: Payer: Self-pay

## 2022-11-09 ENCOUNTER — Ambulatory Visit: Payer: Commercial Managed Care - PPO

## 2022-11-09 DIAGNOSIS — R0602 Shortness of breath: Secondary | ICD-10-CM

## 2022-11-09 DIAGNOSIS — Z20822 Contact with and (suspected) exposure to covid-19: Secondary | ICD-10-CM | POA: Diagnosis present

## 2022-11-09 DIAGNOSIS — R0789 Other chest pain: Secondary | ICD-10-CM | POA: Insufficient documentation

## 2022-11-09 DIAGNOSIS — B349 Viral infection, unspecified: Secondary | ICD-10-CM | POA: Diagnosis present

## 2022-11-09 DIAGNOSIS — R5383 Other fatigue: Secondary | ICD-10-CM | POA: Insufficient documentation

## 2022-11-09 LAB — POCT URINALYSIS DIP (MANUAL ENTRY)
Blood, UA: NEGATIVE
Glucose, UA: NEGATIVE mg/dL
Nitrite, UA: NEGATIVE
Spec Grav, UA: 1.02 (ref 1.010–1.025)
Urobilinogen, UA: 2 U/dL — AB
pH, UA: 7.5 (ref 5.0–8.0)

## 2022-11-09 LAB — POCT FASTING CBG KUC MANUAL ENTRY: POCT Glucose (KUC): 204 mg/dL — AB (ref 70–99)

## 2022-11-09 NOTE — ED Triage Notes (Signed)
Fatigue, nausea, frequent stools (no diarrhea) no appetite, since Tuesday.  Also c/o headache.

## 2022-11-09 NOTE — ED Provider Notes (Signed)
[facility]    CSN: [id number] Arrival date & time: 11/09/22  1453      History   Chief Complaint No chief complaint on file.   HPI [name] is a 57 y.o. female.   The history is provided by the patient.   Patient presents for complaints of fatigue, headache, chest tightness, SOB, nausea, decreased appetite, and loose stools that been present for the past 2 days.  Patient denies fever, chills, nasal congestion, runny nose, cough, vomiting, constipation, or urinary symptoms.  She reports that she has had intermittent abdominal discomfort since her symptoms started.  Patient reports that she did have an hot dog from her family's hot dog stand, which she normally does not eat, and had a roast beef sandwich prior to her symptoms starting.  She was concerned that she may have food poisoning.  Patient reports that she does have a history of diabetes.  She states her blood glucose levels run around 120.  She states she has not checked her blood glucose level today.  She states the chest tightness has also been present, worsening with lying down.  She states that she does have a history of cardiac disease in her family.  Past Medical History:  Diagnosis Date   Anxiety    Bipolar disorder (HCC)    Depression    Diabetes mellitus without complication (HCC)    Type 2   Dyspnea    r/t anxiety   Encounter for neuropsychological testing    at [facility] 3/20-suggest possible somatization   Headache    Hypertension    Migraines    Pseudoseizure    Last seizure March/April 2024   PTSD (post-traumatic stress disorder)    Rosacea    Seizures (HCC)    [facility] (Dr. Estella Husk) complex partial seizure with epileptiform discharges seen in fronto-central region on eeg   Sleep apnea    borderline    Patient Active Problem List   Diagnosis Date Noted   Pain of right hip joint 02/22/2022   Low back pain 09/21/2021   Pain in joint of left shoulder 07/19/2021   Cervical  radiculopathy 07/19/2021   Elevated LFTs 07/04/2021   Fatty liver 07/04/2021   Constipation 07/04/2021   History of colonic polyps 07/04/2021   Migraine without aura and without status migrainosus, not intractable 08/24/2020   History of head injury 08/24/2020   Intractable chronic post-traumatic headache 04/20/2020   History of subarachnoid hemorrhage 04/20/2020   COVID-19 virus infection 12/05/2019   New onset type 2 diabetes mellitus (HCC) 11/12/2019   Subarachnoid hemorrhage following injury (HCC) 11/11/2019   Class 1 obesity due to excess calories with body mass index (BMI) of 34.0 to 34.9 in adult 11/11/2019   Encounter for monitoring postmenopausal estrogen replacement therapy 01/27/2019   Weight loss counseling, encounter for 01/27/2019   Vaginal odor 12/02/2018   Hot flashes due to menopause 12/02/2018   No energy 12/02/2018   Decreased libido 12/02/2018   Counseling for estrogen replacement therapy 12/02/2018   History of seizure 12/02/2018   Encounter for neuropsychological testing    Lumbar stenosis with neurogenic claudication 05/23/2018   Pain in left knee 07/26/2017   Seizures (HCC)    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 1990s   ANTERIOR LAT LUMBAR FUSION N/A 08/03/2022   Procedure: Lumbar two-three extreme Lateral Interbody Fusion;  Surgeon: Barnett Abu, MD;  Location: [facility] OR;  Service: Neurosurgery;   Laterality: N/A;   APPENDECTOMY     BACK SURGERY     x2   CHOLECYSTECTOMY     COLONOSCOPY WITH PROPOFOL N/A 07/20/2021   Surgeon: Corbin Ade, MD;  Three 6-8 mm polyps in the descending colon and cecum removed, diverticulosis in the entire colon, otherwise normal exam.  Pathology with tubular adenomas.  Recommended 5-year surveillance.   CYST EXCISION     on ovaries   lumbar     ruptured disc in lumbar taken out   POLYPECTOMY  07/20/2021   Procedure: POLYPECTOMY;  Surgeon: Corbin Ade, MD;  Location: [facility];  Service: Endoscopy;;   ROTATOR CUFF REPAIR Left    TONSILLECTOMY     TUBAL LIGATION      OB History     Gravida  2   Para  2   Term      Preterm      AB      Living  2      SAB      IAB      Ectopic      Multiple      Live Births               Home Medications    Prior to Admission medications   Medication Sig Start Date End Date Taking? Authorizing Provider  ACCU-CHEK GUIDE test strip USE AS DIRECTED TO MONITOR FSBS 1X DAILY. DX: R73.09. 02/01/21   Donita Brooks, MD  amLODipine (NORVASC) 5 MG tablet Take 10 mg by mouth daily.    [provider]  Blood Glucose Monitoring Suppl (BLOOD GLUCOSE SYSTEM PAK) KIT Please dispense based on patient and insurance preference. Use as directed to monitor FSBS 1x daily. Dx: R73.09. 12/01/19   Donita Brooks, MD  buPROPion (WELLBUTRIN XL) 150 MG 24 hr tablet TAKE 1 TABLET BY MOUTH EVERY DAY 05/04/22   Donita Brooks, MD  clotrimazole-betamethasone (LOTRISONE) cream APPLY TO AFFECTED AREA TWICE A DAY 09/04/22   Donita Brooks, MD  hydrochlorothiazide (HYDRODIURIL) 25 MG tablet Take 1 tablet (25 mg total) by mouth daily. 02/17/22   Donita Brooks, MD  HYDROcodone-acetaminophen (NORCO) 10-325 MG tablet Take 1 tablet by mouth every 4 (four) hours as needed for severe pain. 08/04/22   Barnett Abu, MD  Lancets MISC Please dispense based on patient and insurance preference. Use as  directed to monitor FSBS 1x daily. Dx: R73.09. 12/01/19   Donita Brooks, MD  ondansetron (ZOFRAN) 4 MG tablet TAKE 1 TABLET BY MOUTH EVERY 8 HOURS AS NEEDED FOR NAUSEA AND VOMITING 04/28/22   Donita Brooks, MD  ORACEA 40 MG CPDR Take 40 mg by mouth daily. 07/20/22   [provider]  sitaGLIPtin (JANUVIA) 100 MG tablet Take 1 tablet (100 mg total) by mouth daily. 05/15/22   Donita Brooks, MD  venlafaxine XR (EFFEXOR-XR) 150 MG 24 hr capsule Take 2 capsules (300 mg total) by mouth daily with breakfast.  11/07/22   Donita Brooks, MD  zonisamide (ZONEGRAN) 100 MG capsule Take 200mg  daily at bedtime 12/01/21   [name], [name], NP    Family History Family History  Problem Relation Age of Onset   Diabetes Father    Heart disease Father 30   Hypertension Father    Breast cancer Paternal Aunt        ? age of dx   Cancer Maternal Grandfather        colon cancer (70's)   Diabetes Paternal Grandmother    Diabetes Paternal Grandfather    Cancer Cousin        breast   Breast cancer Cousin    Breast cancer Cousin        paternal   Breast cancer Cousin    Liver cancer Neg Hx     Social History Social History   Tobacco Use   Smoking status: Former    Current packs/day: 0.00    Average packs/day: 0.5 packs/day for 10.0 years (5.0 ttl pk-yrs)    Types: Cigarettes    Start date: 05/14/1981    Quit date: 05/15/1991    Years since quitting: 31.5   Smokeless tobacco: Never  Vaping Use   Vaping status: Never Used  Substance Use Topics   Alcohol use: Yes    Comment: socially   Drug use: No     Allergies   Penicillins, Toradol [ketorolac tromethamine], Hydromorphone, and Lamotrigine   Review of Systems Review of Systems Per HPI  Physical Exam Triage Vital Signs ED Triage Vitals  Encounter Vitals Group     BP 11/09/22 1457 (!) 164/110     Systolic BP Percentile --      Diastolic BP Percentile --      Pulse Rate 11/09/22 1457 95     Resp 11/09/22 1457 18     Temp  11/09/22 1457 98.4 F (36.9 C)     Temp Source 11/09/22 1457 Oral     SpO2 11/09/22 1457 98 %     Weight --      Height --      Head Circumference --      Peak Flow --      Pain Score 11/09/22 1500 6     Pain Loc --      Pain Education --      Exclude from Growth Chart --    No data found.  Updated Vital Signs BP (!) 145/96 (BP Location: Right Arm)   Pulse 95   Temp 98.4 F (36.9 C) (Oral)   Resp 18   SpO2 98%   Visual Acuity Right Eye Distance:   Left Eye Distance:   Bilateral Distance:    Right Eye Near:   Left Eye Near:    Bilateral Near:     Physical Exam Vitals and nursing note reviewed.  Constitutional:      General: She is not in acute distress.    Appearance: Normal appearance.     Comments: Appears fatigued  HENT:     Head: Normocephalic.     Right Ear: Tympanic membrane, ear canal and external ear normal.     Left Ear: Tympanic membrane, ear canal and external ear normal.     Nose: Nose normal.     Mouth/Throat:     Mouth: Mucous membranes are moist.  Eyes:     Extraocular Movements: Extraocular movements intact.     Pupils: Pupils are equal, round, and reactive to light.  Cardiovascular:  Rate and Rhythm: Normal rate and regular rhythm.     Pulses: Normal pulses.     Heart sounds: Normal heart sounds.  Pulmonary:     Effort: Pulmonary effort is normal. No respiratory distress.     Breath sounds: Normal breath sounds. No stridor. No wheezing, rhonchi or rales.  Abdominal:     General: Bowel sounds are normal.     Palpations: Abdomen is soft.     Tenderness: There is no abdominal tenderness.  Musculoskeletal:     Cervical back: Normal range of motion.  Lymphadenopathy:     Cervical: No cervical adenopathy.  Skin:    General: Skin is warm and dry.  Neurological:     General: No focal deficit present.     Mental Status: She is alert and oriented to person, place, and time.  Psychiatric:        Mood and Affect: Mood normal.         Behavior: Behavior normal.      UC Treatments / Results  Labs (all labs ordered are listed, but only abnormal results are displayed) Labs Reviewed  POCT FASTING CBG [facility] MANUAL ENTRY - Abnormal; Notable for the following components:      Result Value   POCT Glucose ([facility]) 204 (*)    All other components within normal limits  POCT URINALYSIS DIP (MANUAL ENTRY) - Abnormal; Notable for the following components:   Bilirubin, UA small (*)    Ketones, POC UA small (15) (*)    Protein Ur, POC trace (*)    Urobilinogen, UA 2.0 (*)    Leukocytes, UA Trace (*)    All other components within normal limits  SARS CORONAVIRUS 2 (TAT 6-24 HRS)  URINE CULTURE  CBC WITH DIFFERENTIAL/PLATELET  BASIC METABOLIC PANEL    EKG: Normal sinus rhythm without ectopy, no STEMI.  Compared to EKGs performed on 07/27/2022 and 06/17/2020   Radiology DG Chest 2 View  Result Date: 11/09/2022 CLINICAL DATA:  Chest tightness.  Shortness of breath. EXAM: CHEST - 2 VIEW COMPARISON:  04/01/2020 FINDINGS: Both lungs are clear. Heart and mediastinum are within normal limits. Trachea is midline. No large pleural effusions. No acute bone abnormality. Surgical clips in the right upper abdomen. IMPRESSION: No active cardiopulmonary disease. Electronically Signed   By: Richarda Overlie M.D.   On: 11/09/2022 17:12    Procedures Procedures (including critical care time)  Medications Ordered in UC Medications - No data to display  Initial Impression / Assessment and Plan / UC Course  I have reviewed the triage vital signs and the nursing notes.  Pertinent labs & imaging results that were available during my care of the patient were reviewed by me and considered in my medical decision making (see chart for details).  Patient presents for a 2-day history of fatigue, chest tightness, shortness of breath, decreased appetite, headache, and nausea.  Urinalysis did not indicate a urinary tract infection, urine culture is pending.  Chest  x-ray was negative for active cardiopulmonary disease.  EKG shows normal sinus rhythm without ectopy.  COVID test is pending.  Suspect a viral illness at this time based on the patient's current presentation and duration of symptoms.  Supportive care recommendations were provided and discussed with the patient to include increasing her fluid intake, allowing for plenty of rest, and continuing over-the-counter analgesics for pain or discomfort.  Patient is able to receive Paxlovid if her COVID test is positive.  Discussed viral etiology with the patient and advised  that if symptoms are not improving over the next 3 to 5 days, would recommend that she follow-up with her primary care physician for further evaluation.  Patient was advised to go to the emergency department for worsening chest pain, chest tightness, or shortness of breath.  Patient was in agreement with this plan of care and verbalizes understanding.  All questions were answered.  Patient stable for discharge.  After discussion with patient at time of discharge, CBC and BMP were added.  Patient was advised she will be contacted if the pending test results are abnormal.  Final Clinical Impressions(s) / UC Diagnoses   Final diagnoses:  Exposure to COVID-19 virus  Other fatigue  Chest tightness  Viral illness     Discharge Instructions      The EKG shows normal sinus rhythm.  Urine culture and COVID test are pending.  You will be contacted if the pending test results are positive.  You will also have access to the results via MyChart. Increase fluids and allow for plenty of rest.  Make sure you are drinking at least 8-10 8 ounce glasses of water to stay well-hydrated. Continue over-the-counter Tylenol or ibuprofen as needed for pain, fever, or general discomfort. Make sure you are eating foods to increase the fiber in your diet.  This includes fruits, vegetables, and wheat toast. As discussed, your symptoms appear to be of a viral  etiology.  If your symptoms are not improving over the next 3 to 5 days, I would like for you to follow-up with your primary care physician for further evaluation. Go to the emergency department immediately if you experience worsening chest tightness, shortness of breath, or other concerns. Follow-up as needed.     ED Prescriptions   None    PDMP not reviewed this encounter.   Abran Cantor, NP 11/09/22 1732

## 2022-11-09 NOTE — Discharge Instructions (Addendum)
The EKG shows normal sinus rhythm.  Urine culture and COVID test are pending.  You will be contacted if the pending test results are positive.  You will also have access to the results via MyChart. Increase fluids and allow for plenty of rest.  Make sure you are drinking at least 8-10 8 ounce glasses of water to stay well-hydrated. Continue over-the-counter Tylenol or ibuprofen as needed for pain, fever, or general discomfort. Make sure you are eating foods to increase the fiber in your diet.  This includes fruits, vegetables, and wheat toast. As discussed, your symptoms appear to be of a viral etiology.  If your symptoms are not improving over the next 3 to 5 days, I would like for you to follow-up with your primary care physician for further evaluation. Go to the emergency department immediately if you experience worsening chest tightness, shortness of breath, or other concerns. Follow-up as needed.

## 2022-11-10 ENCOUNTER — Encounter (HOSPITAL_COMMUNITY): Payer: Medicare Other

## 2022-11-10 LAB — BASIC METABOLIC PANEL
BUN/Creatinine Ratio: 12 (ref 9–23)
BUN: 7 mg/dL (ref 6–24)
CO2: 19 mmol/L — ABNORMAL LOW (ref 20–29)
Calcium: 9.6 mg/dL (ref 8.7–10.2)
Chloride: 107 mmol/L — ABNORMAL HIGH (ref 96–106)
Creatinine, Ser: 0.6 mg/dL (ref 0.57–1.00)
Glucose: 138 mg/dL — ABNORMAL HIGH (ref 70–99)
Potassium: 4.1 mmol/L (ref 3.5–5.2)
Sodium: 142 mmol/L (ref 134–144)
eGFR: 107 mL/min/{1.73_m2} (ref >59)

## 2022-11-10 LAB — CBC WITH DIFFERENTIAL/PLATELET
Basophils Absolute: 0.1 10*3/uL (ref 0.0–0.2)
Basos: 1 %
EOS (ABSOLUTE): 0.1 10*3/uL (ref 0.0–0.4)
Eos: 1 %
Hematocrit: 43.2 % (ref 34.0–46.6)
Hemoglobin: 14.4 g/dL (ref 11.1–15.9)
Immature Grans (Abs): 0 10*3/uL (ref 0.0–0.1)
Immature Granulocytes: 0 %
Lymphocytes Absolute: 2.4 10*3/uL (ref 0.7–3.1)
Lymphs: 26 %
MCH: 28.7 pg (ref 26.6–33.0)
MCHC: 33.3 g/dL (ref 31.5–35.7)
MCV: 86 fL (ref 79–97)
Monocytes Absolute: 0.4 10*3/uL (ref 0.1–0.9)
Monocytes: 5 %
Neutrophils Absolute: 6.3 10*3/uL (ref 1.4–7.0)
Neutrophils: 67 %
Platelets: 350 10*3/uL (ref 150–450)
RBC: 5.02 x10E6/uL (ref 3.77–5.28)
RDW: 13 % (ref 11.7–15.4)
WBC: 9.3 10*3/uL (ref 3.4–10.8)

## 2022-11-10 LAB — URINE CULTURE: Culture: <10000 — AB

## 2022-11-10 LAB — SARS CORONAVIRUS 2 (TAT 6-24 HRS): SARS Coronavirus 2: NEGATIVE

## 2022-11-13 ENCOUNTER — Ambulatory Visit (HOSPITAL_COMMUNITY): Payer: Commercial Managed Care - PPO | Attending: Neurological Surgery | Admitting: Physical Therapy

## 2022-11-13 DIAGNOSIS — M6281 Muscle weakness (generalized): Secondary | ICD-10-CM | POA: Insufficient documentation

## 2022-11-13 DIAGNOSIS — M5459 Other low back pain: Secondary | ICD-10-CM | POA: Insufficient documentation

## 2022-11-13 DIAGNOSIS — R2689 Other abnormalities of gait and mobility: Secondary | ICD-10-CM | POA: Diagnosis present

## 2022-11-13 DIAGNOSIS — R29898 Other symptoms and signs involving the musculoskeletal system: Secondary | ICD-10-CM | POA: Insufficient documentation

## 2022-11-13 NOTE — Therapy (Signed)
OUTPATIENT PHYSICAL THERAPY TREATMENT/Progress   Patient Name: [name] MRN: [id number] DOB:[birth year], 57 y.o., female Today's Date: 11/13/2022 Progress Note Reporting Period 09/29/22 to 11/13/22  See note below for Objective Data and Assessment of Progress/Goals.     END OF SESSION:  PT End of Session - 11/13/22 0855    Visit Number 8    Number of Visits 12    Date for PT Re-Evaluation 11/10/22    Authorization Type Primary Medicare Secondary: [employer]    Progress Note Due on Visit 10    PT Start Time 0815    PT Stop Time 0855    PT Time Calculation (min) 40 min    Activity Tolerance Patient tolerated treatment well    Behavior During Therapy WFL for tasks assessed/performed                Past Medical History:  Diagnosis Date   Anxiety    Bipolar disorder (HCC)    Depression    Diabetes mellitus without complication (HCC)    Type 2   Dyspnea    r/t anxiety   Encounter for neuropsychological testing    at [facility] 3/20-suggest possible somatization   Headache    Hypertension    Migraines    Pseudoseizure    Last seizure March/April 2024   PTSD (post-traumatic stress disorder)    Rosacea    Seizures (HCC)    [facility] (Dr. 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 1990s   ANTERIOR LAT LUMBAR FUSION N/A 08/03/2022   Procedure: Lumbar two-three extreme Lateral Interbody Fusion;  Surgeon: Barnett Abu, MD;  Location: [facility] OR;  Service: Neurosurgery;  Laterality: N/A;   APPENDECTOMY     BACK SURGERY     x2   CHOLECYSTECTOMY     COLONOSCOPY WITH PROPOFOL N/A 07/20/2021   Surgeon: Corbin Ade, MD;  Three 6-8 mm polyps in the descending colon and cecum removed, diverticulosis in the entire colon, otherwise normal exam.  Pathology with tubular adenomas.   Recommended 5-year surveillance.   CYST EXCISION     on ovaries   lumbar     ruptured disc in lumbar taken out   POLYPECTOMY  07/20/2021   Procedure: POLYPECTOMY;  Surgeon: Corbin Ade, MD;  Location: [facility]ITE;  Service: Endoscopy;;   ROTATOR CUFF REPAIR Left    TONSILLECTOMY     TUBAL LIGATION     Patient Active Problem List   Diagnosis Date Noted   Pain of right hip joint 02/22/2022   Low back pain 09/21/2021   Pain in joint of left shoulder 07/19/2021   Cervical radiculopathy 07/19/2021   Elevated LFTs 07/04/2021   Fatty liver 07/04/2021   Constipation 07/04/2021   History of colonic polyps 07/04/2021   Migraine without aura and without status migrainosus, not intractable 08/24/2020   History of head injury 08/24/2020   Intractable chronic post-traumatic headache 04/20/2020   History of subarachnoid hemorrhage 04/20/2020   COVID-19 virus infection 12/05/2019   New onset type 2 diabetes mellitus (HCC) 11/12/2019   Subarachnoid hemorrhage following injury (HCC) 11/11/2019   Class 1 obesity due to excess calories with body mass index (BMI) of 34.0 to 34.9 in adult 11/11/2019   Encounter for monitoring postmenopausal estrogen replacement therapy 01/27/2019   Weight loss  counseling, encounter for 01/27/2019   Vaginal odor 12/02/2018   Hot flashes due to menopause 12/02/2018   No energy 12/02/2018   Decreased libido 12/02/2018   Counseling for estrogen replacement therapy 12/02/2018   History of seizure 12/02/2018   Encounter for neuropsychological testing    Lumbar stenosis with neurogenic claudication 05/23/2018   Pain in left knee 07/26/2017   Seizures (HCC)    Psychogenic nonepileptic seizure 11/13/2015   Jerking movements of extremities 11/11/2015   Major depressive disorder, recurrent severe without psychotic features (HCC)    Bipolar II disorder (HCC) 08/30/2015   Suicide attempt by drug ingestion (HCC) 08/30/2015   Suicidal ideation 08/30/2015   Migraines  09/29/2014   Migraine 05/19/2013   Left-sided weakness 05/18/2013   Numbness and tingling of left arm and leg 05/18/2013   CVA (cerebral infarction) 05/18/2013   Hypertension    Anxiety     PCP: Lynnea Ferrier MD  REFERRING PROVIDER: Barnett Abu, MD  REFERRING DIAG: [phone] (ICD-10-CM) - Spinal stenosis, lumbar region with neurogenic claudication  Rationale for Evaluation and Treatment: Rehabilitation  THERAPY DIAG:  Other low back pain - Plan: PT plan of care cert/re-cert  Other abnormalities of gait and mobility - Plan: PT plan of care cert/re-cert  Other symptoms and signs involving the musculoskeletal system - Plan: PT plan of care cert/re-cert  Muscle weakness (generalized) - Plan: PT plan of care cert/re-cert  ONSET DATE: DOS 08/03/22  SUBJECTIVE:     PT states that she feels that she feels that she feels that she is about 50% better.   She is still having difficulty getting off the floor .                                                                                                                                                                                        SUBJECTIVE STATEMENT:PT has no complaints   Evaluation: Patient states she continues to remain sore since surgery. Trouble with getting off the toilet. Legs and back are weak. Trouble getting off the floor, bed mobility, transfers. Was unable to get home health after surgery. Numbness in front of thigh since surgery. Back feels like its getting better, still pain on right side.   PERTINENT HISTORY:  initial fusion at L3-4 a subsequent fusion at L5-S1 and more recently decompression fusion for spondylolisthesis at L4-L5. And then L2-L3 on 08/03/22; bipolar disorder, depression, DM, HTN, Hx seizures, PTSD  PAIN:  Are you having pain? Yes: NPRS scale: 0/10 Pain location: R posterior/lateral flank Pain description: soreness Aggravating factors: walking, bending, bed mobility Relieving factors:  advil  PRECAUTIONS: None  WEIGHT BEARING RESTRICTIONS: No  FALLS:  Has patient fallen in last 6 months? No  OCCUPATION: Disability  PLOF: Independent  PATIENT GOALS: get back and legs stronger  OBJECTIVE:   PATIENT SURVEYS:  FOTO 59% function 11/13/22: 68%  SCREENING FOR RED FLAGS: Bowel or bladder incontinence: No Spinal tumors: No Cauda equina syndrome: No Compression fracture: No Abdominal aneurysm: No  COGNITION: Overall cognitive status: Within functional limits for tasks assessed     SENSATION: WFL Numb: L L2-L3  POSTURE: rounded shoulders, forward head, and increased thoracic kyphosis  PALPATION: TTP R QL   LUMBAR ROM:   AROM eval 11/13/22  Flexion 50% limited (Pain/stiff in mid -T/sp 25% limited  Extension 75% limited 20%limited  Right lateral flexion 25% limited wfl  Left lateral flexion 25% limited wfl  Right rotation 25% limited wfl  Left rotation 25% limited Wfl    (Blank rows = not tested)  LOWER EXTREMITY ROM:   decreased hip extension ROM bilaterally   LOWER EXTREMITY MMT:    MMT Right eval Right 9/9 Left eval Left  9/9  Hip flexion 4 5 4- 5  Hip extension 4- 4 3+ 3+  Hip abduction 4- 5 4- 4+  Hip adduction      Hip internal rotation      Hip external rotation      Knee flexion 5  4+ 5  Knee extension 5  4+ 5  Ankle dorsiflexion 5  4+ 5  Ankle plantarflexion      Ankle inversion      Ankle eversion       (Blank rows = not tested)  LUMBAR SPECIAL TESTS:  Femoral nerve tension test: no change in symptoms on L   FUNCTIONAL TESTS:  5 times sit to stand: 12.19 seconds with UE use; 5x sit to stand:   8.7 no UE  30" sit to stand:  14  (13 is below average for age and sex)  2 minute walk test: 450 feet 2 minute walk test: 646 ft  Stairs: unilateral UE support STS: labored with bilateral UE support; able to complete without UE support with dynamic knee valgus bilaterally  GAIT: Distance walked: 450 feet Assistive device  utilized: None Level of assistance: Complete Independence Comments:  TODAY'S TREATMENT:                                                                                                                              DATE: 11/10/22: reassessed see above Sit to stand x 10 Prone single leg raise x 10 Prone single arm/leg raise x 10   10/30/22 Therapeutic exercise x14min - Figure 4 Bridge  20 reps - Clamshell with Resistance  20 reps  Neuromuscular re-education x37min - Side Plank on Knees  10 reps - 3" hold - Standard Plank 10 reps - 3" hold   PATIENT EDUCATION:  Education details: 10/09/22: HEP, walking program progressions EVAL: Patient educated on exam findings, POC, scope of PT, HEP, and beginning  a walking program/any mobility in the morning. Person educated: Patient Education method: Explanation, Demonstration, and Handouts Education comprehension: verbalized understanding, returned demonstration, verbal cues required, and tactile cues required  HOME EXERCISE PROGRAM: Access Code: [email or web address] URL: [email or web address] Date: 10/30/2022 Prepared by: Seymour Bars  Exercises(Updated 10/30/22) - Supine Lower Trunk Rotation  - 1 x daily - 7 x weekly - 10 reps - Supine March  - 1 x daily - 7 x weekly - 10 reps - Figure 4 Bridge  - 1 x daily - 7 x weekly - 20 reps - Clamshell with Resistance  - 1 x daily - 7 x weekly - 20 reps - Side Plank on Knees  - 1 x daily - 7 x weekly - 10 reps - 3" hold - Standard Plank  - 1 x daily - 7 x weekly - 10 reps - 3" hold 11/13/22 - Prone Hip Extension with Pillow Under Abdomen  - 1 x daily - 7 x weekly - 1 sets - 10 reps - 3-5" hold - Prone Alternating Arm and Leg Lifts  - 1 x daily - 7 x weekly - 1 sets - 5-10 reps - 3-5" hold ASSESSMENT:  CLINICAL IMPRESSION: Pt reassessed with 2/2 STG and 2/4 LTG achieved.  Pt is still having difficulty with coming up from a squatted position, getting off the floor and gardening all needing strong  gluteal maximus mm strength which is limited at this time.  Pt will benefit from continued skilled therapy to work on body mechanics as well as higher level gluteal strengthening.   Pt tolerated well with no reports of pain through session.  OBJECTIVE IMPAIRMENTS: decreased activity tolerance, decreased balance, decreased mobility, difficulty walking, decreased ROM, decreased strength, increased muscle spasms, impaired flexibility, improper body mechanics, postural dysfunction, and pain.   ACTIVITY LIMITATIONS: carrying, lifting, bending, squatting, stairs, transfers, bed mobility, reach over head, hygiene/grooming, locomotion level, and caring for others  PARTICIPATION LIMITATIONS: meal prep, cleaning, laundry, driving, shopping, community activity, and yard work  PERSONAL FACTORS: Fitness, Past/current experiences, Time since onset of injury/illness/exacerbation, and 3+ comorbidities: 4 lumbar surgeries, bipolar disorder, depression, DM, HTN, Hx seizures, PTSD  are also affecting patient's functional outcome.   REHAB POTENTIAL: Good  CLINICAL DECISION MAKING: Stable/uncomplicated  EVALUATION COMPLEXITY: Low   GOALS: Goals reviewed with patient? Yes  SHORT TERM GOALS: Target date: 10/20/2022    Patient will be independent with HEP in order to improve functional outcomes. Baseline:  Goal status: MET  2.  Patient will report at least 25% improvement in symptoms for improved quality of life. Baseline:  Goal status: MET    LONG TERM GOALS: Target date: 11/10/2022    Patient will report at least 75% improvement in symptoms for improved quality of life. Baseline:  Goal status: IN PROGRESS  2.  Patient will improve FOTO score by at least 5 points in order to indicate improved tolerance to activity. Baseline: 59% function Goal status: MET  3.  Patient will demonstrate grade of 4+/5 MMT grade in all tested musculature as evidence of improved strength to assist with stair ambulation  and gait.   Baseline: see above Goal status: IN PROGRESS  4.  Patient will be able to complete 5x STS in under 11.4 seconds in order to demonstrate improved functional strength. Baseline: 12.19 seconds with UE use; Goal status: MET     PLAN:  PT FREQUENCY: 2x/week  PT DURATION: 6 weeks an additional 4 weeks   PLANNED INTERVENTIONS: Therapeutic exercises, Therapeutic activity, Neuromuscular  re-education, Balance training, Gait training, Patient/Family education, Joint manipulation, Joint mobilization, Stair training, Orthotic/Fit training, DME instructions, Aquatic Therapy, Dry Needling, Electrical stimulation, Spinal manipulation, Spinal mobilization, Cryotherapy, Moist heat, Compression bandaging, scar mobilization, Splintting, Taping, Traction, Ultrasound, Ionotophoresis 4mg /ml Dexamethasone, and Manual therapy  PLAN FOR NEXT SESSION: Review HEP to confirm patient is tolerating new exercises well    Virgina Organ, PT CLT [phone]  11/13/2022, 9:09 AM

## 2022-11-15 ENCOUNTER — Telehealth: Payer: Self-pay | Admitting: Family Medicine

## 2022-11-15 ENCOUNTER — Ambulatory Visit (HOSPITAL_COMMUNITY): Payer: Commercial Managed Care - PPO | Admitting: Physical Therapy

## 2022-11-15 DIAGNOSIS — M5459 Other low back pain: Secondary | ICD-10-CM | POA: Diagnosis not present

## 2022-11-15 DIAGNOSIS — M6281 Muscle weakness (generalized): Secondary | ICD-10-CM

## 2022-11-15 DIAGNOSIS — R2689 Other abnormalities of gait and mobility: Secondary | ICD-10-CM

## 2022-11-15 DIAGNOSIS — R29898 Other symptoms and signs involving the musculoskeletal system: Secondary | ICD-10-CM

## 2022-11-15 MED ORDER — ZONISAMIDE 100 MG PO CAPS: ORAL_CAPSULE | ORAL | 0 refills | Status: DC

## 2022-11-15 NOTE — Therapy (Signed)
OUTPATIENT PHYSICAL THERAPY TREATMENT   Patient Name: [name] MRN: [id number] DOB:[birth year], 57 y.o., female Today's Date: 11/15/2022   END OF SESSION:   PT End of Session - 11/15/22 0900     Visit Number 9    Number of Visits 16    Date for PT Re-Evaluation 12/14/22    Authorization Type Primary Medicare Secondary: [employer]    Progress Note Due on Visit 10    PT Start Time 0820    PT Stop Time 0900    PT Time Calculation (min) 40 min    Activity Tolerance Patient tolerated treatment well    Behavior During Therapy WFL for tasks assessed/performed              Past Medical History:  Diagnosis Date   Anxiety    Bipolar disorder (HCC)    Depression    Diabetes mellitus without complication (HCC)    Type 2   Dyspnea    r/t anxiety   Encounter for neuropsychological testing    at [facility] 3/20-suggest possible somatization   Headache    Hypertension    Migraines    Pseudoseizure    Last seizure March/April 2024   PTSD (post-traumatic stress disorder)    Rosacea    Seizures (HCC)    [facility] (Dr. 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 1990s   ANTERIOR LAT LUMBAR FUSION N/A 08/03/2022   Procedure: Lumbar two-three extreme Lateral Interbody Fusion;  Surgeon: Barnett Abu, MD;  Location: [facility] OR;  Service: Neurosurgery;  Laterality: N/A;   APPENDECTOMY     BACK SURGERY     x2   CHOLECYSTECTOMY     COLONOSCOPY WITH PROPOFOL N/A 07/20/2021   Surgeon: Corbin Ade, MD;  Three 6-8 mm polyps in the descending colon and cecum removed, diverticulosis in the entire colon, otherwise normal exam.  Pathology with tubular adenomas.  Recommended 5-year surveillance.   CYST EXCISION     on ovaries   lumbar     ruptured disc in lumbar taken out   POLYPECTOMY   07/20/2021   Procedure: POLYPECTOMY;  Surgeon: Corbin Ade, MD;  Location: [facility];  Service: Endoscopy;;   ROTATOR CUFF REPAIR Left    TONSILLECTOMY     TUBAL LIGATION     Patient Active Problem List   Diagnosis Date Noted   Pain of right hip joint 02/22/2022   Low back pain 09/21/2021   Pain in joint of left shoulder 07/19/2021   Cervical radiculopathy 07/19/2021   Elevated LFTs 07/04/2021   Fatty liver 07/04/2021   Constipation 07/04/2021   History of colonic polyps 07/04/2021   Migraine without aura and without status migrainosus, not intractable 08/24/2020   History of head injury 08/24/2020   Intractable chronic post-traumatic headache 04/20/2020   History of subarachnoid hemorrhage 04/20/2020   COVID-19 virus infection 12/05/2019   New onset type 2 diabetes mellitus (HCC) 11/12/2019   Subarachnoid hemorrhage following injury (HCC) 11/11/2019   Class 1 obesity due to excess calories with body mass index (BMI) of 34.0 to 34.9 in adult 11/11/2019   Encounter for monitoring postmenopausal estrogen replacement therapy 01/27/2019   Weight loss counseling, encounter for 01/27/2019   Vaginal odor 12/02/2018   Hot flashes due to menopause 12/02/2018   No  energy 12/02/2018   Decreased libido 12/02/2018   Counseling for estrogen replacement therapy 12/02/2018   History of seizure 12/02/2018   Encounter for neuropsychological testing    Lumbar stenosis with neurogenic claudication 05/23/2018   Pain in left knee 07/26/2017   Seizures (HCC)    Psychogenic nonepileptic seizure 11/13/2015   Jerking movements of extremities 11/11/2015   Major depressive disorder, recurrent severe without psychotic features (HCC)    Bipolar II disorder (HCC) 08/30/2015   Suicide attempt by drug ingestion (HCC) 08/30/2015   Suicidal ideation 08/30/2015   Migraines 09/29/2014   Migraine 05/19/2013   Left-sided weakness 05/18/2013   Numbness and tingling of left arm and leg 05/18/2013   CVA  (cerebral infarction) 05/18/2013   Hypertension    Anxiety     PCP: Lynnea Ferrier MD  REFERRING PROVIDER: Barnett Abu, MD  REFERRING DIAG: [phone] (ICD-10-CM) - Spinal stenosis, lumbar region with neurogenic claudication  Rationale for Evaluation and Treatment: Rehabilitation  THERAPY DIAG:  Other low back pain  Other symptoms and signs involving the musculoskeletal system  Other abnormalities of gait and mobility  Muscle weakness (generalized)  ONSET DATE: DOS 08/03/22  .                                                                                                                                                                                        SUBJECTIVE STATEMENT:PT states that she has been sore with the new exercises.   PERTINENT HISTORY:  initial fusion at L3-4 a subsequent fusion at L5-S1 and more recently decompression fusion for spondylolisthesis at L4-L5. And then L2-L3 on 08/03/22; bipolar disorder, depression, DM, HTN, Hx seizures, PTSD  PAIN:  Are you having pain? Yes: NPRS scale: 0/10 Pain location: R posterior/lateral flank Pain description: soreness Aggravating factors: walking, bending, bed mobility Relieving factors: advil  PRECAUTIONS: None  WEIGHT BEARING RESTRICTIONS: No  FALLS:  Has patient fallen in last 6 months? No  OCCUPATION: Disability  PLOF: Independent  PATIENT GOALS: get back and legs stronger  OBJECTIVE:   PATIENT SURVEYS:  FOTO 59% function 11/13/22: 68%  SCREENING FOR RED FLAGS: Bowel or bladder incontinence: No Spinal tumors: No Cauda equina syndrome: No Compression fracture: No Abdominal aneurysm: No  COGNITION: Overall cognitive status: Within functional limits for tasks assessed     SENSATION: WFL Numb: L L2-L3  POSTURE: rounded shoulders, forward head, and increased thoracic kyphosis  PALPATION: TTP R QL   LUMBAR ROM:   AROM eval 11/13/22  Flexion 50% limited (Pain/stiff in mid -T/sp 25% limited   Extension 75% limited 20%limited  Right lateral flexion 25% limited wfl  Left lateral flexion 25%  limited wfl  Right rotation 25% limited wfl  Left rotation 25% limited Wfl    (Blank rows = not tested)  LOWER EXTREMITY ROM:   decreased hip extension ROM bilaterally   LOWER EXTREMITY MMT:    MMT Right eval Right 9/9 Left eval Left  9/9  Hip flexion 4 5 4- 5  Hip extension 4- 4 3+ 3+  Hip abduction 4- 5 4- 4+  Hip adduction      Hip internal rotation      Hip external rotation      Knee flexion 5  4+ 5  Knee extension 5  4+ 5  Ankle dorsiflexion 5  4+ 5  Ankle plantarflexion      Ankle inversion      Ankle eversion       (Blank rows = not tested)  LUMBAR SPECIAL TESTS:  Femoral nerve tension test: no change in symptoms on L   FUNCTIONAL TESTS:  5 times sit to stand: 12.19 seconds with UE use; 5x sit to stand:   8.7 no UE  30" sit to stand:  14  (13 is below average for age and sex)  2 minute walk test: 450 feet 2 minute walk test: 646 ft  Stairs: unilateral UE support STS: labored with bilateral UE support; able to complete without UE support with dynamic knee valgus bilaterally  GAIT: Distance walked: 450 feet Assistive device utilized: None Level of assistance: Complete Independence Comments:  TODAY'S TREATMENT:                                                                                                                              DATE: 11/15/22 Quadriped: Opposite arm/leg raise x 10 Mad cat/old horse x 5 Prone: POE x 1 minute followed by 10 glut sets x 10 " in same position Heel squeeze x 10 Supine: Bridge with knee adduction x 10 Knee to chest x 30" x 3 Active hamstring stretch x 30"  Sitting: sit to stand x 10 Standing :   Heel raise x 10 Squat x 10  Nustep x 5 minutes level 3   11/10/22: reassessed see above Sit to stand x 10 Prone single leg raise x 10 Prone single arm/leg raise x 10   10/30/22 Therapeutic exercise x29min - Figure  4 Bridge  20 reps - Clamshell with Resistance  20 reps  Neuromuscular re-education x20min - Side Plank on Knees  10 reps - 3" hold - Standard Plank 10 reps - 3" hold   PATIENT EDUCATION:  Education details: 10/09/22: HEP, walking program progressions EVAL: Patient educated on exam findings, POC, scope of PT, HEP, and beginning a walking program/any mobility in the morning. Person educated: Patient Education method: Explanation, Demonstration, and Handouts Education comprehension: verbalized understanding, returned demonstration, verbal cues required, and tactile cues required  HOME EXERCISE PROGRAM: Access Code: [email or web address] URL: [email or web address] Date: 10/30/2022 Prepared by: Seymour Bars  Exercises(Updated 10/30/22) - Supine Lower Trunk Rotation  -  1 x daily - 7 x weekly - 10 reps - Supine March  - 1 x daily - 7 x weekly - 10 reps - Figure 4 Bridge  - 1 x daily - 7 x weekly - 20 reps - Clamshell with Resistance  - 1 x daily - 7 x weekly - 20 reps - Side Plank on Knees  - 1 x daily - 7 x weekly - 10 reps - 3" hold - Standard Plank  - 1 x daily - 7 x weekly - 10 reps - 3" hold 11/13/22 - Prone Hip Extension with Pillow Under Abdomen  - 1 x daily - 7 x weekly - 1 sets - 10 reps - 3-5" hold - Prone Alternating Arm and Leg Lifts  - 1 x daily - 7 x weekly - 1 sets - 5-10 reps - 3-5" hold 11/15/22 Access Code: [email or web address] URL: [email or web address] Date: 11/15/2022 Prepared by: Virgina Organ - Supine Single Knee to Chest Stretch  - 1 x daily - 7 x weekly - 1 sets - 3 reps - 30" hold - Supine Hamstring Stretch  - 1 x daily - 7 x weekly - 1 sets - 3 reps - 30" hold - Static Prone on Elbows  - 1 x daily - 7 x weekly - 1 sets - 3 reps - 30" hold ASSESSMENT:  CLINICAL IMPRESSION: Therapist focused on gluteal strengthening as well as stretching this session.  Began education on body mechaincs.  Added stretching exercises to HEP. Pt is still having difficulty with  coming up from a squatted position, getting off the floor and gardening all needing strong gluteal maximus mm strength which is limited at this time.  Pt will benefit from continued skilled therapy to work on body mechanics as well as higher level gluteal strengthening.   Pt tolerated well with no reports of pain through session.  OBJECTIVE IMPAIRMENTS: decreased activity tolerance, decreased balance, decreased mobility, difficulty walking, decreased ROM, decreased strength, increased muscle spasms, impaired flexibility, improper body mechanics, postural dysfunction, and pain.   ACTIVITY LIMITATIONS: carrying, lifting, bending, squatting, stairs, transfers, bed mobility, reach over head, hygiene/grooming, locomotion level, and caring for others  PARTICIPATION LIMITATIONS: meal prep, cleaning, laundry, driving, shopping, community activity, and yard work  PERSONAL FACTORS: Fitness, Past/current experiences, Time since onset of injury/illness/exacerbation, and 3+ comorbidities: 4 lumbar surgeries, bipolar disorder, depression, DM, HTN, Hx seizures, PTSD  are also affecting patient's functional outcome.   REHAB POTENTIAL: Good  CLINICAL DECISION MAKING: Stable/uncomplicated  EVALUATION COMPLEXITY: Low   GOALS: Goals reviewed with patient? Yes  SHORT TERM GOALS: Target date: 10/20/2022    Patient will be independent with HEP in order to improve functional outcomes. Baseline:  Goal status: MET  2.  Patient will report at least 25% improvement in symptoms for improved quality of life. Baseline:  Goal status: MET    LONG TERM GOALS: Target date: 11/10/2022    Patient will report at least 75% improvement in symptoms for improved quality of life. Baseline:  Goal status: IN PROGRESS  2.  Patient will improve FOTO score by at least 5 points in order to indicate improved tolerance to activity. Baseline: 59% function Goal status: MET  3.  Patient will demonstrate grade of 4+/5 MMT grade  in all tested musculature as evidence of improved strength to assist with stair ambulation and gait.   Baseline: see above Goal status: IN PROGRESS  4.  Patient will be able to complete 5x STS in under 11.4 seconds in order  to demonstrate improved functional strength. Baseline: 12.19 seconds with UE use; Goal status: MET     PLAN:  PT FREQUENCY: 2x/week  PT DURATION: 6 weeks an additional 4 weeks   PLANNED INTERVENTIONS: Therapeutic exercises, Therapeutic activity, Neuromuscular re-education, Balance training, Gait training, Patient/Family education, Joint manipulation, Joint mobilization, Stair training, Orthotic/Fit training, DME instructions, Aquatic Therapy, Dry Needling, Electrical stimulation, Spinal manipulation, Spinal mobilization, Cryotherapy, Moist heat, Compression bandaging, scar mobilization, Splintting, Taping, Traction, Ultrasound, Ionotophoresis 4mg /ml Dexamethasone, and Manual therapy  PLAN FOR NEXT SESSION: Review HEP to confirm patient is tolerating new exercises well    Virgina Organ, PT CLT [phone]  11/15/2022, 9:02 AM

## 2022-11-15 NOTE — Telephone Encounter (Signed)
Last seen on 12/01/21 Follow up scheduled on 11/28/22 Rx sent.

## 2022-11-15 NOTE — Telephone Encounter (Signed)
Pt is down to 3 pills, she is going out of town and would like to have the zonisamide (ZONEGRAN) 100 MG capsule to [id number]  To filled as soon as possible

## 2022-11-27 ENCOUNTER — Encounter (HOSPITAL_COMMUNITY): Payer: Medicare Other

## 2022-11-28 ENCOUNTER — Encounter: Payer: Self-pay | Admitting: Family Medicine

## 2022-11-28 ENCOUNTER — Ambulatory Visit (INDEPENDENT_AMBULATORY_CARE_PROVIDER_SITE_OTHER): Payer: No Typology Code available for payment source | Admitting: Family Medicine

## 2022-11-28 VITALS — BP 116/94 | HR 79 | Ht 66.0 in | Wt 217.0 lb

## 2022-11-28 DIAGNOSIS — G43009 Migraine without aura, not intractable, without status migrainosus: Secondary | ICD-10-CM
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
## 2022-11-28 NOTE — Patient Instructions (Addendum)
Below is our plan:  We will continue zonisamide 200mg  at bedtime   Please make sure you are staying well hydrated. I recommend 50-60 ounces daily. Well balanced diet and regular exercise encouraged. Consistent sleep schedule with 6-8 hours recommended.   Please continue follow up with care team as directed.   Follow up with me in 1 year   You may receive a survey regarding today's visit. I encourage you to leave honest feed back as I do use this information to improve patient care. Thank you for seeing me today!   GENERAL HEADACHE INFORMATION:   Natural supplements: Magnesium Oxide or Magnesium Glycinate 500 mg at bed (up to 800 mg daily) Coenzyme Q10 300 mg in AM Vitamin B2- 200 mg twice a day   Add 1 supplement at a time since even natural supplements can have undesirable side effects. You can sometimes buy supplements cheaper (especially Coenzyme Q10) at www.WebmailGuide.co.za or at [facility].  Migraine with aura: There is increased risk for stroke in women with migraine with aura and a contraindication for the combined contraceptive pill for use by women who have migraine with aura. The risk for women with migraine without aura is lower. However other risk factors like smoking are far more likely to increase stroke risk than migraine. There is a recommendation for no smoking and for the use of OCPs without estrogen such as progestogen only pills particularly for women with migraine with aura.Marland Kitchen People who have migraine headaches with auras may be 3 times more likely to have a stroke caused by a blood clot, compared to migraine patients who don't see auras. Women who take hormone-replacement therapy may be 30 percent more likely to suffer a clot-based stroke than women not taking medication containing estrogen. Other risk factors like smoking and high blood pressure may be  much more important.    Vitamins and herbs that show potential:   Magnesium: Magnesium (250 mg twice a day or 500 mg at bed)  has a relaxant effect on smooth muscles such as blood vessels. Individuals suffering from frequent or daily headache usually have low magnesium levels which can be increase with daily supplementation of 400-750 mg. Three trials found 40-90% average headache reduction  when used as a preventative. Magnesium may help with headaches are aura, the best evidence for magnesium is for migraine with aura is its thought to stop the cortical spreading depression we believe is the pathophysiology of migraine aura.Magnesium also demonstrated the benefit in menstrually related migraine.  Magnesium is part of the messenger system in the serotonin cascade and it is a good muscle relaxant.  It is also useful for constipation which can be a side effect of other medications used to treat migraine. Good sources include nuts, whole grains, and tomatoes. Side Effects: loose stool/diarrhea  Riboflavin (vitamin B 2) 200 mg twice a day. This vitamin assists nerve cells in the production of ATP a principal energy storing molecule.  It is necessary for many chemical reactions in the body.  There have been at least 3 clinical trials of riboflavin using 400 mg per day all of which suggested that migraine frequency can be decreased.  All 3 trials showed significant improvement in over half of migraine sufferers.  The supplement is found in bread, cereal, milk, meat, and poultry.  Most Americans get more riboflavin than the recommended daily allowance, however riboflavin deficiency is not necessary for the supplements to help prevent headache. Side effects: energizing, green urine   Coenzyme Q10: This  is present in almost all cells in the body and is critical component for the conversion of energy.  Recent studies have shown that a nutritional supplement of CoQ10 can reduce the frequency of migraine attacks by improving the energy production of cells as with riboflavin.  Doses of 150 mg twice a day have been shown to be effective.    Melatonin: Increasing evidence shows correlation between melatonin secretion and headache conditions.  Melatonin supplementation has decreased headache intensity and duration.  It is widely used as a sleep aid.  Sleep is natures way of dealing with migraine.  A dose of 3 mg is recommended to start for headaches including cluster headache. Higher doses up to 15 mg has been reviewed for use in Cluster headache and have been used. The rationale behind using melatonin for cluster is that many theories regarding the cause of Cluster headache center around the disruption of the normal circadian rhythm in the brain.  This helps restore the normal circadian rhythm.   HEADACHE DIET: Foods and beverages which may trigger migraine Note that only 20% of headache patients are food sensitive. You will know if you are food sensitive if you get a headache consistently 20 minutes to 2 hours after eating a certain food. Only cut out a food if it causes headaches, otherwise you might remove foods you enjoy! What matters most for diet is to eat a well balanced healthy diet full of vegetables and low fat protein, and to not miss meals.   Chocolate, other sweets ALL cheeses except cottage and cream cheese Dairy products, yogurt, sour cream, ice cream Liver Meat extracts (Bovril, Marmite, meat tenderizers) Meats or fish which have undergone aging, fermenting, pickling or smoking. These include: Hotdogs,salami,Lox,sausage, mortadellas,smoked salmon, pepperoni, Pickled herring Pods of broad bean (English beans, Chinese pea pods, [place] (fava) beans, lima and navy beans Ripe avocado, ripe banana Yeast extracts or active yeast preparations such as Brewer's or Fleishman's (commercial bakes goods are permitted) Tomato based foods, pizza (lasagna, etc.)   MSG (monosodium glutamate) is disguised as many things; look for these common aliases: Monopotassium glutamate Autolysed yeast Hydrolysed protein Sodium  caseinate "flavorings" "all natural preservatives" Nutrasweet   Avoid all other foods that convincingly provoke headaches.   Resources: The Dizzy [place]air Laundry Your Headache Diet, migrainestrong.com  https://zamora-andrews.com/   Caffeine and Migraine For patients that have migraine, caffeine intake more than 3 days per week can lead to dependency and increased migraine frequency. I would recommend cutting back on your caffeine intake as best you can. The recommended amount of caffeine is 200-300 mg daily, although migraine patients may experience dependency at even lower doses. While you may notice an increase in headache temporarily, cutting back will be helpful for headaches in the long run. For more information on caffeine and migraine, visit: https://americanmigrainefoundation.org/resource-library/caffeine-and-migraine/   Headache Prevention Strategies:   1. Maintain a headache diary; learn to identify and avoid triggers.  - This can be a simple note where you log when you had a headache, associated symptoms, and medications used - There are several smartphone apps developed to help track migraines: Migraine Buddy, Migraine Monitor, Curelator N1-Headache App   Common triggers include: Emotional triggers: Emotional/Upset family or friends Emotional/Upset occupation Business reversal/success Anticipation anxiety Crisis-serious Post-crisis periodNew job/position   Physical triggers: Vacation Day Weekend Strenuous Exercise High Altitude Location New Move Menstrual Day Physical Illness Oversleep/Not enough sleep Weather changes Light: Photophobia or light sesnitivity treatment involves a balance between desensitization and reduction in overly strong  input. Use dark polarized glasses outside, but not inside. Avoid bright or fluorescent light, but do not dim environment to the point that going into a normally lit room hurts. Consider  FL-41 tint lenses, which reduce the most irritating wavelengths without blocking too much light.  These can be obtained at axonoptics.com or theraspecs.com Foods: see list above.   2. Limit use of acute treatments (over-the-counter medications, triptans, etc.) to no more than 2 days per week or 10 days per month to prevent medication overuse headache (rebound headache).     3. Follow a regular schedule (including weekends and holidays): Don't skip meals. Eat a balanced diet. 8 hours of sleep nightly. Minimize stress. Exercise 30 minutes per day. Being overweight is associated with a 5 times increased risk of chronic migraine. Keep well hydrated and drink 6-8 glasses of water per day.   4. Initiate non-pharmacologic measures at the earliest onset of your headache. Rest and quiet environment. Relax and reduce stress. Breathe2Relax is a free app that can instruct you on    some simple relaxtion and breathing techniques. Http://Dawnbuse.com is a    free website that provides teaching videos on relaxation.  Also, there are  many apps that   can be downloaded for "mindful" relaxation.  An app called YOGA NIDRA will help walk you through mindfulness. Another app called Calm can be downloaded to give you a structured mindfulness guide with daily reminders and skill development. Headspace for guided meditation Mindfulness Based Stress Reduction Online Course: www.palousemindfulness.com Cold compresses.   5. Don't wait!! Take the maximum allowable dosage of prescribed medication at the first sign of migraine.   6. Compliance:  Take prescribed medication regularly as directed and at the first sign of a migraine.   7. Communicate:  Call your physician when problems arise, especially if your headaches change, increase in frequency/severity, or become associated with neurological symptoms (weakness, numbness, slurred speech, etc.). Proceed to emergency room if you experience new or worsening symptoms or  symptoms do not resolve, if you have new neurologic symptoms or if headache is severe, or for any concerning symptom.   8. Headache/pain management therapies: Consider various complementary methods, including medication, behavioral therapy, psychological counselling, biofeedback, massage therapy, acupuncture, dry needling, and other modalities.  Such measures may reduce the need for medications. Counseling for pain management, where patients learn to function and ignore/minimize their pain, seems to work very well.   9. Recommend changing family's attention and focus away from patient's headaches. Instead, emphasize daily activities. If first question of day is 'How are your headaches/Do you have a headache today?', then patient will constantly think about headaches, thus making them worse. Goal is to re-direct attention away from headaches, toward daily activities and other distractions.   10. Helpful Websites: www.AmericanHeadacheSociety.org PatentHood.ch www.headaches.org TightMarket.nl www.achenet.org

## 2022-11-29 ENCOUNTER — Encounter (HOSPITAL_COMMUNITY): Payer: Self-pay

## 2022-11-29 ENCOUNTER — Ambulatory Visit (HOSPITAL_COMMUNITY): Payer: Commercial Managed Care - PPO

## 2022-11-29 DIAGNOSIS — R2689 Other abnormalities of gait and mobility: Secondary | ICD-10-CM

## 2022-11-29 DIAGNOSIS — M6281 Muscle weakness (generalized): Secondary | ICD-10-CM

## 2022-11-29 DIAGNOSIS — M5459 Other low back pain: Secondary | ICD-10-CM

## 2022-11-29 DIAGNOSIS — R29898 Other symptoms and signs involving the musculoskeletal system: Secondary | ICD-10-CM

## 2022-11-29 NOTE — Therapy (Signed)
OUTPATIENT PHYSICAL THERAPY TREATMENT   Patient Name: [name] MRN: [id number] DOB:Nov 21, 1965, 57 y.o., female Today's Date: 11/29/2022   END OF SESSION: END OF SESSION:   PT End of Session - 11/29/22 0759     Visit Number 10    Number of Visits 16    Date for PT Re-Evaluation 12/14/22    Authorization Type Primary Medicare Secondary: [employer]    Progress Note Due on Visit 16   PN complete on visit #8   PT Start Time 0800    PT Stop Time 0839    PT Time Calculation (min) 39 min    Activity Tolerance Patient tolerated treatment well    Behavior During Therapy WFL for tasks assessed/performed             Past Medical History:  Diagnosis Date   Anxiety    Bipolar disorder (HCC)    Depression    Diabetes mellitus without complication (HCC)    Type 2   Dyspnea    r/t anxiety   Encounter for neuropsychological testing    at [facility] 3/20-suggest possible somatization   Headache    Hypertension    Migraines    Pseudoseizure    Last seizure March/April 2024   PTSD (post-traumatic stress disorder)    Rosacea    Seizures (HCC)    [facility] (Dr. 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 1990s   ANTERIOR LAT LUMBAR FUSION N/A 08/03/2022   Procedure: Lumbar two-three extreme Lateral Interbody Fusion;  Surgeon: Barnett Abu, MD;  Location: [facility] OR;  Service: Neurosurgery;  Laterality: N/A;   APPENDECTOMY     BACK SURGERY     x2   CHOLECYSTECTOMY     COLONOSCOPY WITH PROPOFOL N/A 07/20/2021   Surgeon: Corbin Ade, MD;  Three 6-8 mm polyps in the descending colon and cecum removed, diverticulosis in the entire colon, otherwise normal exam.  Pathology with tubular adenomas.  Recommended 5-year surveillance.   CYST EXCISION     on ovaries   lumbar     ruptured  disc in lumbar taken out   POLYPECTOMY  07/20/2021   Procedure: POLYPECTOMY;  Surgeon: Corbin Ade, MD;  Location: [facility] SUITE;  Service: Endoscopy;;   ROTATOR CUFF REPAIR Left    TONSILLECTOMY     TUBAL LIGATION     Patient Active Problem List   Diagnosis Date Noted   Pain of right hip joint 02/22/2022   Low back pain 09/21/2021   Pain in joint of left shoulder 07/19/2021   Cervical radiculopathy 07/19/2021   Elevated LFTs 07/04/2021   Fatty liver 07/04/2021   Constipation 07/04/2021   History of colonic polyps 07/04/2021   Migraine without aura and without status migrainosus, not intractable 08/24/2020   History of head injury 08/24/2020   Intractable chronic post-traumatic headache 04/20/2020   History of subarachnoid hemorrhage 04/20/2020   COVID-19 virus infection 12/05/2019   New onset type 2 diabetes mellitus (HCC) 11/12/2019   Subarachnoid hemorrhage following injury (HCC) 11/11/2019   Class 1 obesity due to excess calories with body mass index (BMI) of 34.0 to 34.9 in adult 11/11/2019   Encounter for monitoring postmenopausal estrogen replacement therapy 01/27/2019   Weight loss counseling, encounter for 01/27/2019   Vaginal odor 12/02/2018   Hot  flashes due to menopause 12/02/2018   No energy 12/02/2018   Decreased libido 12/02/2018   Counseling for estrogen replacement therapy 12/02/2018   History of seizure 12/02/2018   Encounter for neuropsychological testing    Lumbar stenosis with neurogenic claudication 05/23/2018   Pain in left knee 07/26/2017   Seizures (HCC)    Psychogenic nonepileptic seizure 11/13/2015   Jerking movements of extremities 11/11/2015   Major depressive disorder, recurrent severe without psychotic features (HCC)    Bipolar II disorder (HCC) 08/30/2015   Suicide attempt by drug ingestion (HCC) 08/30/2015   Suicidal ideation 08/30/2015   Migraines 09/29/2014   Migraine 05/19/2013   Left-sided weakness 05/18/2013   Numbness and  tingling of left arm and leg 05/18/2013   CVA (cerebral infarction) 05/18/2013   Hypertension    Anxiety     PCP: Lynnea Ferrier MD  REFERRING PROVIDER: Barnett Abu, MD  REFERRING DIAG: [phone] (ICD-10-CM) - Spinal stenosis, lumbar region with neurogenic claudication  Rationale for Evaluation and Treatment: Rehabilitation  THERAPY DIAG:  Other low back pain  Other symptoms and signs involving the musculoskeletal system  Other abnormalities of gait and mobility  Muscle weakness (generalized)  ONSET DATE: DOS 08/03/22  .                                                                                                                                                                                        SUBJECTIVE STATEMENT: Returned from cruise, reports she had increase swelling BLE.  Current LBP pain scale 5/10, sore and achy pain.  Admits she didn't complete any exercises while on cruise, no questions with new exercises.  PERTINENT HISTORY:  initial fusion at L3-4 a subsequent fusion at L5-S1 and more recently decompression fusion for spondylolisthesis at L4-L5. And then L2-L3 on 08/03/22; bipolar disorder, depression, DM, HTN, Hx seizures, PTSD  PAIN:  Are you having pain? Yes: NPRS scale: 0/10 Pain location: R posterior/lateral flank Pain description: soreness Aggravating factors: walking, bending, bed mobility Relieving factors: advil  PRECAUTIONS: None  WEIGHT BEARING RESTRICTIONS: No  FALLS:  Has patient fallen in last 6 months? No  OCCUPATION: Disability  PLOF: Independent  PATIENT GOALS: get back and legs stronger  OBJECTIVE:   PATIENT SURVEYS:  FOTO 59% function 11/13/22: 68%  SCREENING FOR RED FLAGS: Bowel or bladder incontinence: No Spinal tumors: No Cauda equina syndrome: No Compression fracture: No Abdominal aneurysm: No  COGNITION: Overall cognitive status: Within functional limits for tasks assessed     SENSATION: WFL Numb: L  L2-L3  POSTURE: rounded shoulders, forward head, and increased thoracic kyphosis  PALPATION: TTP R QL   LUMBAR ROM:  AROM eval 11/13/22  Flexion 50% limited (Pain/stiff in mid -T/sp 25% limited  Extension 75% limited 20%limited  Right lateral flexion 25% limited wfl  Left lateral flexion 25% limited wfl  Right rotation 25% limited wfl  Left rotation 25% limited Wfl    (Blank rows = not tested)  LOWER EXTREMITY ROM:   decreased hip extension ROM bilaterally   LOWER EXTREMITY MMT:    MMT Right eval Right 9/9 Left eval Left  9/9  Hip flexion 4 5 4- 5  Hip extension 4- 4 3+ 3+  Hip abduction 4- 5 4- 4+  Hip adduction      Hip internal rotation      Hip external rotation      Knee flexion 5  4+ 5  Knee extension 5  4+ 5  Ankle dorsiflexion 5  4+ 5  Ankle plantarflexion      Ankle inversion      Ankle eversion       (Blank rows = not tested)  LUMBAR SPECIAL TESTS:  Femoral nerve tension test: no change in symptoms on L   FUNCTIONAL TESTS:  5 times sit to stand: 12.19 seconds with UE use; 5x sit to stand:   8.7 no UE  30" sit to stand:  14  (13 is below average for age and sex)  2 minute walk test: 450 feet 2 minute walk test: 646 ft  Stairs: unilateral UE support STS: labored with bilateral UE support; able to complete without UE support with dynamic knee valgus bilaterally  GAIT: Distance walked: 450 feet Assistive device utilized: None Level of assistance: Complete Independence Comments:  TODAY'S TREATMENT:                                                                                                                              DATE: 11/29/22: Quadruped: Opposite arm/leg raise x 10 Cat/camel 10x Prone: Hip extension 10x Supine: Hamstring stretch 2x 30" hands behind knee Standing: Squat 10x Lumbar extension 10x Heel raise incline slope 10x 5" with ab set Lunge 10x SLS 30"+ Vector stance 2x 30"  11/15/22 Quadriped: Opposite arm/leg raise  x 10 Mad cat/old horse x 5 Prone: POE x 1 minute followed by 10 glut sets x 10 " in same position Heel squeeze x 10 Supine: Bridge with knee adduction x 10 Knee to chest x 30" x 3 Active hamstring stretch x 30"  Sitting: sit to stand x 10 Standing :   Heel raise x 10 Squat x 10  Nustep x 5 minutes level 3   11/10/22: reassessed see above Sit to stand x 10 Prone single leg raise x 10 Prone single arm/leg raise x 10   10/30/22 Therapeutic exercise x64min - Figure 4 Bridge  20 reps - Clamshell with Resistance  20 reps  Neuromuscular re-education x29min - Side Plank on Knees  10 reps - 3" hold - Standard Plank 10 reps - 3" hold  PATIENT EDUCATION:  Education details: 10/09/22: HEP, walking program progressions EVAL: Patient educated on exam findings, POC, scope of PT, HEP, and beginning a walking program/any mobility in the morning. Person educated: Patient Education method: Explanation, Demonstration, and Handouts Education comprehension: verbalized understanding, returned demonstration, verbal cues required, and tactile cues required  HOME EXERCISE PROGRAM: Access Code: [email or web address] URL: [email or web address] Date: 10/30/2022 Prepared by: Seymour Bars  Exercises(Updated 10/30/22) - Supine Lower Trunk Rotation  - 1 x daily - 7 x weekly - 10 reps - Supine March  - 1 x daily - 7 x weekly - 10 reps - Figure 4 Bridge  - 1 x daily - 7 x weekly - 20 reps - Clamshell with Resistance  - 1 x daily - 7 x weekly - 20 reps - Side Plank on Knees  - 1 x daily - 7 x weekly - 10 reps - 3" hold - Standard Plank  - 1 x daily - 7 x weekly - 10 reps - 3" hold 11/13/22 - Prone Hip Extension with Pillow Under Abdomen  - 1 x daily - 7 x weekly - 1 sets - 10 reps - 3-5" hold - Prone Alternating Arm and Leg Lifts  - 1 x daily - 7 x weekly - 1 sets - 5-10 reps - 3-5" hold 11/15/22 Access Code: [email or web address] URL: [email or web address] Date: 11/15/2022 Prepared by: Virgina Organ - Supine Single Knee to Chest Stretch  - 1 x daily - 7 x weekly - 1 sets - 3 reps - 30" hold - Supine Hamstring Stretch  - 1 x daily - 7 x weekly - 1 sets - 3 reps - 30" hold - Static Prone on Elbows  - 1 x daily - 7 x weekly - 1 sets - 3 reps - 30" hold ASSESSMENT:  CLINICAL IMPRESSION: Session focus with gluteal strengthening and stretches for lumbar mobility.  Added lunges for functional strengthening and SLS based activities for stability.  Pt tolerated well with pain reduced at EOS.    OBJECTIVE IMPAIRMENTS: decreased activity tolerance, decreased balance, decreased mobility, difficulty walking, decreased ROM, decreased strength, increased muscle spasms, impaired flexibility, improper body mechanics, postural dysfunction, and pain.   ACTIVITY LIMITATIONS: carrying, lifting, bending, squatting, stairs, transfers, bed mobility, reach over head, hygiene/grooming, locomotion level, and caring for others  PARTICIPATION LIMITATIONS: meal prep, cleaning, laundry, driving, shopping, community activity, and yard work  PERSONAL FACTORS: Fitness, Past/current experiences, Time since onset of injury/illness/exacerbation, and 3+ comorbidities: 4 lumbar surgeries, bipolar disorder, depression, DM, HTN, Hx seizures, PTSD  are also affecting patient's functional outcome.   REHAB POTENTIAL: Good  CLINICAL DECISION MAKING: Stable/uncomplicated  EVALUATION COMPLEXITY: Low   GOALS: Goals reviewed with patient? Yes  SHORT TERM GOALS: Target date: 10/20/2022    Patient will be independent with HEP in order to improve functional outcomes. Baseline:  Goal status: MET  2.  Patient will report at least 25% improvement in symptoms for improved quality of life. Baseline:  Goal status: MET    LONG TERM GOALS: Target date: 11/10/2022    Patient will report at least 75% improvement in symptoms for improved quality of life. Baseline:  Goal status: IN PROGRESS  2.  Patient will improve FOTO  score by at least 5 points in order to indicate improved tolerance to activity. Baseline: 59% function Goal status: MET  3.  Patient will demonstrate grade of 4+/5 MMT grade in all tested musculature as evidence of improved strength to assist with stair ambulation and gait.  Baseline: see above Goal status: IN PROGRESS  4.  Patient will be able to complete 5x STS in under 11.4 seconds in order to demonstrate improved functional strength. Baseline: 12.19 seconds with UE use; Goal status: MET     PLAN:  PT FREQUENCY: 2x/week  PT DURATION: 6 weeks an additional 4 weeks   PLANNED INTERVENTIONS: Therapeutic exercises, Therapeutic activity, Neuromuscular re-education, Balance training, Gait training, Patient/Family education, Joint manipulation, Joint mobilization, Stair training, Orthotic/Fit training, DME instructions, Aquatic Therapy, Dry Needling, Electrical stimulation, Spinal manipulation, Spinal mobilization, Cryotherapy, Moist heat, Compression bandaging, scar mobilization, Splintting, Taping, Traction, Ultrasound, Ionotophoresis 4mg /ml Dexamethasone, and Manual therapy  PLAN FOR NEXT SESSION: Review HEP to confirm patient is tolerating new exercises well.  Progress functional strengthening.   Becky Sax, LPTA/CLT; CBIS [phone]  Juel Burrow, PTA 11/29/2022, 11:42 AM  11/29/2022, 11:42 AM

## 2022-12-02 ENCOUNTER — Other Ambulatory Visit: Payer: Self-pay | Admitting: Family Medicine

## 2022-12-02 DIAGNOSIS — E118 Type 2 diabetes mellitus with unspecified complications: Secondary | ICD-10-CM

## 2022-12-04 NOTE — Telephone Encounter (Signed)
Requested Prescriptions  Pending Prescriptions Disp Refills   clotrimazole-betamethasone (LOTRISONE) cream [Pharmacy Med Name: CLOTRIMAZOLE-BETAMETHASONE CRM] 30 g 0    Sig: APPLY TO AFFECTED AREA TWICE A DAY     Off-Protocol Failed - 12/02/2022  3:07 PM      Failed - Medication not assigned to a protocol, review manually.      Failed - Valid encounter within last 12 months    Recent Outpatient Visits           1 year ago Acute pain of left shoulder   [facility] Pickard, [name], MD   1 year ago Memory loss   [facility] Donita Brooks, MD   1 year ago Controlled type 2 diabetes mellitus with complication, without long-term current use of insulin (HCC)   [facility] Pickard, [name], MD   1 year ago Fall, initial encounter   [facility] Pickard, [name], MD   2 years ago Acute nasopharyngitis   [facility] [name], [name], MD               sitaGLIPtin (JANUVIA) 100 MG tablet [Pharmacy Med Name: JANUVIA 100 MG TABLET] 90 tablet 0    Sig: TAKE 1 TABLET BY MOUTH EVERY DAY     Endocrinology:  Diabetes - DPP-4 Inhibitors Failed - 12/02/2022  3:07 PM      Failed - Valid encounter within last 6 months    Recent Outpatient Visits           1 year ago Acute pain of left shoulder   [facility] Donita Brooks, MD   1 year ago Memory loss   [facility] Donita Brooks, MD   1 year ago Controlled type 2 diabetes mellitus with complication, without long-term current use of insulin (HCC)   [facility] Donita Brooks, MD   1 year ago Fall, initial encounter   [facility] Donita Brooks, MD   2 years ago Acute nasopharyngitis   [facility] Pickard, [name], MD              Passed - HBA1C is between 0 and 7.9 and within 180 days    Hgb A1c MFr Bld  Date Value Ref Range Status  07/27/2022 6.2  (H) 4.8 - 5.6 % Final    Comment:    (NOTE)         Prediabetes: 5.7 - 6.4         Diabetes: >6.4         Glycemic control for adults with diabetes: <7.0          Passed - Cr in normal range and within 360 days    Creat  Date Value Ref Range Status  04/18/2022 0.68 0.50 - 1.03 mg/dL Final   Creatinine, Ser  Date Value Ref Range Status  11/09/2022 0.60 0.57 - 1.00 mg/dL Final   Creatinine, Urine  Date Value Ref Range Status  04/18/2022 147 20 - 275 mg/dL Final

## 2022-12-04 NOTE — Telephone Encounter (Signed)
Requested medication (s) are due for refill today: yes  Requested medication (s) are on the active medication list: yes  Last refill:  09/04/22  Future visit scheduled: no  Notes to clinic:  Medication not assigned to a protocol, review manually.      Requested Prescriptions  Pending Prescriptions Disp Refills   clotrimazole-betamethasone (LOTRISONE) cream [Pharmacy Med Name: CLOTRIMAZOLE-BETAMETHASONE CRM] 30 g 0    Sig: APPLY TO AFFECTED AREA TWICE A DAY     Off-Protocol Failed - 12/02/2022  3:07 PM      Failed - Medication not assigned to a protocol, review manually.      Failed - Valid encounter within last 12 months    Recent Outpatient Visits           1 year ago Acute pain of left shoulder   [facility] Pickard, [name], MD   1 year ago Memory loss   [facility] Donita Brooks, MD   1 year ago Controlled type 2 diabetes mellitus with complication, without long-term current use of insulin (HCC)   [facility] Pickard, [name], MD   1 year ago Fall, initial encounter   [facility] Pickard, [name], MD   2 years ago Acute nasopharyngitis   [facility] [name], [name], MD              Signed Prescriptions Disp Refills   sitaGLIPtin (JANUVIA) 100 MG tablet 90 tablet 0    Sig: TAKE 1 TABLET BY MOUTH EVERY DAY     Endocrinology:  Diabetes - DPP-4 Inhibitors Failed - 12/02/2022  3:07 PM      Failed - Valid encounter within last 6 months    Recent Outpatient Visits           1 year ago Acute pain of left shoulder   [facility] Donita Brooks, MD   1 year ago Memory loss   [facility] Donita Brooks, MD   1 year ago Controlled type 2 diabetes mellitus with complication, without long-term current use of insulin (HCC)   [facility] Donita Brooks, MD   1 year ago Fall, initial encounter   [facility]  Donita Brooks, MD   2 years ago Acute nasopharyngitis   [facility] Pickard, [name], MD              Passed - HBA1C is between 0 and 7.9 and within 180 days    Hgb A1c MFr Bld  Date Value Ref Range Status  07/27/2022 6.2 (H) 4.8 - 5.6 % Final    Comment:    (NOTE)         Prediabetes: 5.7 - 6.4         Diabetes: >6.4         Glycemic control for adults with diabetes: <7.0          Passed - Cr in normal range and within 360 days    Creat  Date Value Ref Range Status  04/18/2022 0.68 0.50 - 1.03 mg/dL Final   Creatinine, Ser  Date Value Ref Range Status  11/09/2022 0.60 0.57 - 1.00 mg/dL Final   Creatinine, Urine  Date Value Ref Range Status  04/18/2022 147 20 - 275 mg/dL Final

## 2022-12-05 ENCOUNTER — Encounter (HOSPITAL_COMMUNITY): Payer: Medicare Other

## 2022-12-06 ENCOUNTER — Encounter: Payer: Self-pay | Admitting: Adult Health

## 2022-12-06 ENCOUNTER — Ambulatory Visit: Payer: Commercial Managed Care - PPO | Admitting: Adult Health

## 2022-12-06 VITALS — BP 123/81 | HR 78 | Ht 66.5 in | Wt 221.0 lb

## 2022-12-06 DIAGNOSIS — Z0000 Encounter for general adult medical examination without abnormal findings: Secondary | ICD-10-CM

## 2022-12-06 DIAGNOSIS — Z1331 Encounter for screening for depression: Secondary | ICD-10-CM | POA: Diagnosis not present

## 2022-12-06 DIAGNOSIS — Z1211 Encounter for screening for malignant neoplasm of colon: Secondary | ICD-10-CM | POA: Diagnosis not present

## 2022-12-06 DIAGNOSIS — R61 Generalized hyperhidrosis: Secondary | ICD-10-CM | POA: Insufficient documentation

## 2022-12-06 DIAGNOSIS — R232 Flushing: Secondary | ICD-10-CM | POA: Diagnosis not present

## 2022-12-06 DIAGNOSIS — Z90710 Acquired absence of both cervix and uterus: Secondary | ICD-10-CM

## 2022-12-06 DIAGNOSIS — Z01419 Encounter for gynecological examination (general) (routine) without abnormal findings: Secondary | ICD-10-CM | POA: Insufficient documentation

## 2022-12-06 LAB — HEMOCCULT GUIAC POC 1CARD (OFFICE): Fecal Occult Blood, POC: NEGATIVE

## 2022-12-06 MED ORDER — ESTRADIOL 0.1 MG/24HR TD PTTW: 1.0000 | MEDICATED_PATCH | TRANSDERMAL | 6 refills | Status: DC

## 2022-12-06 NOTE — Progress Notes (Signed)
Patient ID: [name], female   DOB: 09/23/1965, 57 y.o.   MRN: [id number] History of Present Illness: [name] is a 57 year old white female, married, sp hysterectomy in for a well woman gyn exam.  PCP is Dr [name]   Current Medications, Allergies, Past Medical History, Past Surgical History, Family History and Social History were reviewed in [employer] electronic medical record.     Review of Systems: Patient denies any headaches, hearing loss,  blurred vision, shortness of breath, chest pain, abdominal pain, problems with bowel movements, urination, or intercourse. No joint pain or mood swings.  Has hot flashes and night sweats +tired Can't lose weight Had back surgery in May and feels numb in left groin area  Feels dry with sex(try good lubricate, like astroglide, coconut oil or olive oil)  Physical Exam:BP 123/81 (BP Location: Left Arm, Patient Position: Sitting, Cuff Size: Large)   Pulse 78   Ht 5' 6.5" (1.689 m)   Wt 221 lb (100.2 kg)   BMI 35.14 kg/m   General:  Well developed, well nourished, no acute distress Skin:  Warm and dry Neck:  Midline trachea, normal thyroid, good ROM, no lymphadenopathy Lungs; Clear to auscultation bilaterally Breast:  No dominant palpable mass, retraction, or nipple discharge, has cherry hemangiomas over breasts and trunk Cardiovascular: Regular rate and rhythm Abdomen:  Soft, non tender, no hepatosplenomegaly Pelvic:  External genitalia is normal in appearance, no lesions.Inner thigh area skin is darkened and has ?skin fungus, has used lotrisone.  The vagina is normal in pale. Urethra has no lesions or masses. The cervix and uterus are absent. No adnexal masses or tenderness noted.Bladder is non tender, no masses felt. Rectal: Good sphincter tone, no polyps, or hemorrhoids felt.  Hemoccult negative. Extremities/musculoskeletal:  No swelling or varicosities noted, no clubbing or cyanosis Psych:  No mood changes, alert and cooperative,seems  happy AA is 1 Fall risk is low    12/06/2022   10:34 AM 04/18/2022    4:16 PM 03/30/2022   11:09 AM  Depression screen PHQ 2/9  Decreased Interest 1 0 0  Down, Depressed, Hopeless 1 0 0  PHQ - 2 Score 2 0 0  Altered sleeping 2 0 0  Tired, decreased energy 3 0 0  Change in appetite 1 0 0  Feeling bad or failure about yourself  0 0 0  Trouble concentrating 1 0 0  Moving slowly or fidgety/restless 0 0 0  Suicidal thoughts 0 0 0  PHQ-9 Score 9 0 0  Difficult doing work/chores  Not difficult at all Not difficult at all       12/06/2022   10:34 AM 02/10/2022    8:54 AM 04/05/2020    8:20 AM  GAD 7 : Generalized Anxiety Score  Nervous, Anxious, on Edge 1 0 2  Control/stop worrying 0 0 2  Worry too much - different things 0 0 2  Trouble relaxing 3 0 3  Restless 1 0 2  Easily annoyed or irritable 2 0 3  Afraid - awful might happen 0 0 1  Total GAD 7 Score 7 0 15  Anxiety Difficulty   Very difficult    Upstream - 12/06/22 1044       Pregnancy Intention Screening   Does the patient want to become pregnant in the next year? No    Does the patient's partner want to become pregnant in the next year? No    Would the patient like to discuss contraceptive options today?  No      Contraception Wrap Up   Current Method Female Sterilization   hysterectomy   End Method Female Sterilization    Contraception Counseling Provided No              Examination chaperoned by Freddie Apley RN   Impression and Plan: 1. Encounter for screening fecal occult blood testing Hemoccult was negative  - POCT occult blood stool  2. Encounter for well woman exam with routine gynecological exam Physical in 1 year Mammogram was negative 03/28/22 Labs with PCP  3. S/P hysterectomy  4. Hot flashes Discussed try ET patch again Denies MI,stroke,DVT or breast cancer Meds ordered this encounter  Medications   estradiol (VIVELLE-DOT) 0.1 MG/24HR patch    Sig: Place 1 patch (0.1 mg total) onto the  skin 2 (two) times a week.    Dispense:  8 patch    Refill:  6    Order Specific Question:   Supervising Provider    Answer:   Despina Hidden, LUTHER H [[id number]]     5. Night sweats Will try ET Follow up in 3 months for ROS

## 2022-12-07 ENCOUNTER — Encounter (HOSPITAL_COMMUNITY): Payer: Medicare Other

## 2022-12-11 ENCOUNTER — Encounter (HOSPITAL_COMMUNITY): Payer: Medicare Other | Admitting: Physical Therapy

## 2022-12-12 LAB — HM DIABETES EYE EXAM

## 2022-12-13 ENCOUNTER — Encounter (HOSPITAL_COMMUNITY): Payer: Medicare Other | Admitting: Physical Therapy

## 2022-12-25 ENCOUNTER — Encounter: Payer: Self-pay | Admitting: Family Medicine

## 2023-01-16 ENCOUNTER — Encounter (HOSPITAL_COMMUNITY): Payer: Self-pay | Admitting: Physical Therapy

## 2023-01-16 ENCOUNTER — Ambulatory Visit (INDEPENDENT_AMBULATORY_CARE_PROVIDER_SITE_OTHER): Payer: Commercial Managed Care - PPO | Admitting: Family Medicine

## 2023-01-16 VITALS — BP 132/76 | HR 76 | Temp 98.2°F | Ht 66.5 in | Wt 218.0 lb

## 2023-01-16 DIAGNOSIS — E118 Type 2 diabetes mellitus with unspecified complications: Secondary | ICD-10-CM | POA: Diagnosis not present

## 2023-01-16 DIAGNOSIS — Z7984 Long term (current) use of oral hypoglycemic drugs: Secondary | ICD-10-CM | POA: Diagnosis not present

## 2023-01-16 MED ORDER — RYBELSUS 3 MG PO TABS: 3.0000 mg | ORAL_TABLET | Freq: Every day | ORAL | 3 refills | Status: DC

## 2023-01-16 NOTE — Progress Notes (Signed)
Subjective:    Patient ID: [name], female    DOB: [birth year], 57 y.o.   MRN: [id number] Patient has a history of type 2 diabetes mellitus.  She has tried metformin in the past and could not tolerate due to diarrhea.  She is also tried Ozempic but had severe nausea with this.  Her insurance will no longer cover Januvia.  Her last A1c on Januvia was 6.2.  She is due to recheck that today. Past Medical History:  Diagnosis Date   Anxiety    Bipolar disorder (HCC)    Depression    Diabetes mellitus without complication (HCC)    Type 2   Dyspnea    r/t anxiety   Encounter for neuropsychological testing    at [facility] 3/20-suggest possible somatization   Headache    Hypertension    Migraines    Pseudoseizure    Last seizure March/April 2024   PTSD (post-traumatic stress disorder)    Rosacea    Seizures (HCC)    [facility] (Dr. 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 1990s   ANTERIOR LAT LUMBAR FUSION N/A 08/03/2022   Procedure: Lumbar two-three extreme Lateral Interbody Fusion;  Surgeon: Barnett Abu, MD;  Location: [facility] OR;  Service: Neurosurgery;  Laterality: N/A;   APPENDECTOMY     BACK SURGERY     x2   CHOLECYSTECTOMY     COLONOSCOPY WITH PROPOFOL N/A 07/20/2021   Surgeon: Corbin Ade, MD;  Three 6-8 mm polyps in the descending colon and cecum removed, diverticulosis in the entire colon, otherwise normal exam.  Pathology with tubular adenomas.  Recommended 5-year surveillance.   CYST EXCISION     on ovaries   lumbar     ruptured disc in lumbar taken out   POLYPECTOMY  07/20/2021   Procedure: POLYPECTOMY;  Surgeon: Corbin Ade, MD;  Location: [facility] SUITE;  Service: Endoscopy;;   ROTATOR CUFF REPAIR Left    TONSILLECTOMY     TUBAL LIGATION     Current Outpatient  Medications on File Prior to Visit  Medication Sig Dispense Refill   ACCU-CHEK GUIDE test strip USE AS DIRECTED TO MONITOR FSBS 1X DAILY. DX: R73.09. 100 strip 11   amLODipine (NORVASC) 5 MG tablet Take 10 mg by mouth daily.     Blood Glucose Monitoring Suppl (BLOOD GLUCOSE SYSTEM PAK) KIT Please dispense based on patient and insurance preference. Use as directed to monitor FSBS 1x daily. Dx: R73.09. 1 kit 1   buPROPion (WELLBUTRIN XL) 150 MG 24 hr tablet TAKE 1 TABLET BY MOUTH EVERY DAY 90 tablet 2   clotrimazole-betamethasone (LOTRISONE) cream APPLY TO AFFECTED AREA TWICE A DAY 30 g 0   estradiol (VIVELLE-DOT) 0.1 MG/24HR patch Place 1 patch (0.1 mg total) onto the skin 2 (two) times a week. 8 patch 6   hydrochlorothiazide (HYDRODIURIL) 25 MG tablet Take 1 tablet (25 mg total) by mouth daily. 90 tablet 3   HYDROcodone-acetaminophen (NORCO) 10-325 MG tablet Take 1 tablet by mouth every 4 (four) hours as needed for severe pain. 40 tablet 0   Lancets MISC Please dispense based on patient and insurance preference. Use as directed to monitor FSBS 1x daily. Dx: R73.09. 50 each 0   ondansetron (ZOFRAN) 4 MG tablet TAKE 1 TABLET BY MOUTH  EVERY 8 HOURS AS NEEDED FOR NAUSEA AND VOMITING 20 tablet 1   ORACEA 40 MG CPDR Take 40 mg by mouth daily.     venlafaxine XR (EFFEXOR-XR) 150 MG 24 hr capsule Take 2 capsules (300 mg total) by mouth daily with breakfast. 180 capsule 1   zonisamide (ZONEGRAN) 100 MG capsule Take 200mg  daily at bedtime 180 capsule 0   No current facility-administered medications on file prior to visit.   Allergies  Allergen Reactions   Penicillins Hives    Has patient had a PCN reaction causing immediate rash, facial/tongue/throat swelling, SOB or lightheadedness with hypotension: Yes Has patient had a PCN reaction causing severe rash involving mucus membranes or skin necrosis: No Has patient had a PCN reaction that required hospitalization No Has patient had a PCN reaction  occurring within the last 10 years: No If all of the above answers are "NO", then may proceed with Cephalosporin use.     Toradol [Ketorolac Tromethamine] Hives   Hydromorphone     Other reaction(s): Hallucination   Lamotrigine Rash   Social History   Socioeconomic History   Marital status: Married    Spouse name: Not on file   Number of children: 2   Years of education: Associates    Highest education level: Not on file  Occupational History   Not on file  Tobacco Use   Smoking status: Former    Current packs/day: 0.00    Average packs/day: 0.5 packs/day for 10.0 years (5.0 ttl pk-yrs)    Types: Cigarettes    Start date: 05/14/1981    Quit date: 05/15/1991    Years since quitting: 31.6   Smokeless tobacco: Never  Vaping Use   Vaping status: Never Used  Substance and Sexual Activity   Alcohol use: Yes    Comment: socially   Drug use: No   Sexual activity: Yes    Birth control/protection: Surgical    Comment: hyst  Other Topics Concern   Not on file  Social History Narrative   Caffeine use: tea sometimes, soda sometimes   Right handed   Married x 5 years 01/27/21   1 son and 1 daughter. 5 grandchildren.    Social Determinants of Health   Financial Resource Strain: Low Risk  (12/06/2022)   Overall Financial Resource Strain (CARDIA)    Difficulty of Paying Living Expenses: Not hard at all  Food Insecurity: No Food Insecurity (12/06/2022)   Hunger Vital Sign    Worried About Running Out of Food in the Last Year: Never true    Ran Out of Food in the Last Year: Never true  Transportation Needs: No Transportation Needs (12/06/2022)   PRAPARE - Administrator, Civil Service (Medical): No    Lack of Transportation (Non-Medical): No  Physical Activity: Insufficiently Active (12/06/2022)   Exercise Vital Sign    Days of Exercise per Week: 1 day    Minutes of Exercise per Session: 20 min  Stress: Stress Concern Present (12/06/2022)   [employer] of  Occupational Health - Occupational Stress Questionnaire    Feeling of Stress : To some extent  Social Connections: Moderately Integrated (12/06/2022)   Social Connection and Isolation Panel [NHANES]    Frequency of Communication with Friends and Family: More than three times a week    Frequency of Social Gatherings with Friends and Family: Once a week    Attends Religious Services: More than 4 times per year    Active Member of Clubs or  Organizations: No    Attends Banker Meetings: Never    Marital Status: Married  Catering manager Violence: Not At Risk (12/06/2022)   Humiliation, Afraid, Rape, and Kick questionnaire    Fear of Current or Ex-Partner: No    Emotionally Abused: No    Physically Abused: No    Sexually Abused: No      Review of Systems  All other systems reviewed and are negative.      Objective:    Physical Exam Vitals reviewed.  Constitutional:      General: She is not in acute distress.    Appearance: Normal appearance. She is normal weight. She is not ill-appearing, toxic-appearing or diaphoretic.  HENT:     Head: Normocephalic.  Cardiovascular:     Rate and Rhythm: Normal rate and regular rhythm.     Heart sounds: No murmur heard.   Pulmonary:     Effort: Pulmonary effort is normal. No respiratory distress.     Breath sounds: Normal breath sounds.   Neurological:     General: No focal deficit present.     Mental Status: She is alert and oriented to person, place, and time. Mental status is at baseline.     Cranial Nerves: No cranial nerve deficit.     Motor: No weakness.     Coordination: Coordination normal.     Gait: Gait normal.  Psychiatric:        Mood and Affect: Mood normal.        Behavior: Behavior normal.        Thought Content: Thought content normal.        Judgment: Judgment normal.     Assessment & Plan:  Controlled type 2 diabetes mellitus with complication, without long-term current use of insulin (HCC) - Plan:  Hemoglobin A1c, CBC with Differential/Platelet, COMPLETE METABOLIC PANEL WITH GFR, Lipid panel, Microalbumin/Creatinine Ratio, Urine Blood pressure is well-controlled.  Check CBC CMP lipid panel A1c and urine protein creatinine ratio.  We discussed her options and she would like to try Rybelsus 3 mg a day and discontinue Januvia

## 2023-01-16 NOTE — Therapy (Signed)
PHYSICAL THERAPY DISCHARGE SUMMARY  Visits from Start of Care: 10  Current functional level related to goals / functional outcomes: Unknown as pt has not returned    Remaining deficits: Unknown as pt has not returned    Education / Equipment: HEP   Patient agrees to discharge. Patient goals were partially met. Patient is being discharged due to not returning since the last visit.  Virgina Organ, PT CLT [phone]

## 2023-01-17 LAB — CBC WITH DIFFERENTIAL/PLATELET
Absolute Lymphocytes: 2203 {cells}/uL (ref 850–3900)
Absolute Monocytes: 403 {cells}/uL (ref 200–950)
Basophils Absolute: 58 {cells}/uL (ref 0–200)
Basophils Relative: 0.8 %
Eosinophils Absolute: 180 {cells}/uL (ref 15–500)
Eosinophils Relative: 2.5 %
HCT: 41.8 % (ref 35.0–45.0)
Hemoglobin: 13.4 g/dL (ref 11.7–15.5)
MCH: 28.4 pg (ref 27.0–33.0)
MCHC: 32.1 g/dL (ref 32.0–36.0)
MCV: 88.6 fL (ref 80.0–100.0)
MPV: 10.3 fL (ref 7.5–12.5)
Monocytes Relative: 5.6 %
Neutro Abs: 4356 {cells}/uL (ref 1500–7800)
Neutrophils Relative %: 60.5 %
Platelets: 291 10*3/uL (ref 140–400)
RBC: 4.72 10*6/uL (ref 3.80–5.10)
RDW: 13 % (ref 11.0–15.0)
Total Lymphocyte: 30.6 %
WBC: 7.2 10*3/uL (ref 3.8–10.8)

## 2023-01-17 LAB — MICROALBUMIN / CREATININE URINE RATIO
Creatinine, Urine: 256 mg/dL (ref 20–275)
Microalb Creat Ratio: 4 mg/g{creat} (ref <30)
Microalb, Ur: 1.1 mg/dL

## 2023-01-17 LAB — COMPLETE METABOLIC PANEL WITH GFR
AG Ratio: 1.6 (calc) (ref 1.0–2.5)
ALT: 22 U/L (ref 6–29)
AST: 18 U/L (ref 10–35)
Albumin: 3.7 g/dL (ref 3.6–5.1)
Alkaline phosphatase (APISO): 128 U/L (ref 37–153)
BUN: 7 mg/dL (ref 7–25)
CO2: 25 mmol/L (ref 20–32)
Calcium: 8.7 mg/dL (ref 8.6–10.4)
Chloride: 106 mmol/L (ref 98–110)
Creat: 0.61 mg/dL (ref 0.50–1.03)
Globulin: 2.3 g/dL (ref 1.9–3.7)
Glucose, Bld: 137 mg/dL — ABNORMAL HIGH (ref 65–99)
Potassium: 3.6 mmol/L (ref 3.5–5.3)
Sodium: 143 mmol/L (ref 135–146)
Total Bilirubin: 0.5 mg/dL (ref 0.2–1.2)
Total Protein: 6 g/dL — ABNORMAL LOW (ref 6.1–8.1)
eGFR: 106 mL/min/{1.73_m2} (ref >=60)

## 2023-01-17 LAB — HEMOGLOBIN A1C
Hgb A1c MFr Bld: 6.6 %{Hb} — ABNORMAL HIGH (ref <5.7)
Mean Plasma Glucose: 143 mg/dL
eAG (mmol/L): 7.9 mmol/L

## 2023-01-17 LAB — LIPID PANEL
Cholesterol: 207 mg/dL — ABNORMAL HIGH (ref <200)
HDL: 59 mg/dL (ref >=50)
LDL Cholesterol (Calc): 125 mg/dL — ABNORMAL HIGH
Non-HDL Cholesterol (Calc): 148 mg/dL — ABNORMAL HIGH (ref <130)
Total CHOL/HDL Ratio: 3.5 (calc) (ref <5.0)
Triglycerides: 123 mg/dL (ref <150)

## 2023-01-19 ENCOUNTER — Other Ambulatory Visit: Payer: Self-pay

## 2023-01-19 DIAGNOSIS — E7800 Pure hypercholesterolemia, unspecified: Secondary | ICD-10-CM

## 2023-01-19 MED ORDER — ROSUVASTATIN CALCIUM 10 MG PO TABS
10.0000 mg | ORAL_TABLET | Freq: Every day | ORAL | 3 refills | Status: DC
Start: 2023-01-19 — End: 2023-05-29

## 2023-01-29 ENCOUNTER — Other Ambulatory Visit: Payer: Self-pay

## 2023-01-29 DIAGNOSIS — K760 Fatty (change of) liver, not elsewhere classified: Secondary | ICD-10-CM

## 2023-01-29 DIAGNOSIS — R7989 Other specified abnormal findings of blood chemistry: Secondary | ICD-10-CM

## 2023-03-06 ENCOUNTER — Other Ambulatory Visit: Payer: Self-pay

## 2023-03-06 MED ORDER — ZONISAMIDE 100 MG PO CAPS: ORAL_CAPSULE | ORAL | 0 refills | Status: DC

## 2023-03-07 LAB — CBC WITH DIFFERENTIAL/PLATELET
Absolute Lymphocytes: 1916 {cells}/uL (ref 850–3900)
Absolute Monocytes: 362 {cells}/uL (ref 200–950)
Basophils Absolute: 60 {cells}/uL (ref 0–200)
Basophils Relative: 0.9 %
Eosinophils Absolute: 228 {cells}/uL (ref 15–500)
Eosinophils Relative: 3.4 %
HCT: 42.1 % (ref 35.0–45.0)
Hemoglobin: 14 g/dL (ref 11.7–15.5)
MCH: 29.4 pg (ref 27.0–33.0)
MCHC: 33.3 g/dL (ref 32.0–36.0)
MCV: 88.3 fL (ref 80.0–100.0)
MPV: 10.4 fL (ref 7.5–12.5)
Monocytes Relative: 5.4 %
Neutro Abs: 4134 {cells}/uL (ref 1500–7800)
Neutrophils Relative %: 61.7 %
Platelets: 318 10*3/uL (ref 140–400)
RBC: 4.77 10*6/uL (ref 3.80–5.10)
RDW: 13.3 % (ref 11.0–15.0)
Total Lymphocyte: 28.6 %
WBC: 6.7 10*3/uL (ref 3.8–10.8)

## 2023-03-07 LAB — HEPATIC FUNCTION PANEL
AG Ratio: 1.7 (calc) (ref 1.0–2.5)
ALT: 27 U/L (ref 6–29)
AST: 19 U/L (ref 10–35)
Albumin: 4.4 g/dL (ref 3.6–5.1)
Alkaline phosphatase (APISO): 157 U/L — ABNORMAL HIGH (ref 37–153)
Bilirubin, Direct: 0.1 mg/dL (ref 0.0–0.2)
Globulin: 2.6 g/dL (ref 1.9–3.7)
Indirect Bilirubin: 0.3 mg/dL (ref 0.2–1.2)
Total Bilirubin: 0.4 mg/dL (ref 0.2–1.2)
Total Protein: 7 g/dL (ref 6.1–8.1)

## 2023-03-07 LAB — ANTI-NUCLEAR AB-TITER (ANA TITER): ANA Titer 1: 1:80 {titer} — ABNORMAL HIGH

## 2023-03-07 LAB — HEPATITIS C ANTIBODY: Hepatitis C Ab: NONREACTIVE

## 2023-03-07 LAB — TISSUE TRANSGLUTAMINASE, IGA: (tTG) Ab, IgA: <1 U/mL

## 2023-03-07 LAB — ANA: Anti Nuclear Antibody (ANA): POSITIVE — AB

## 2023-03-07 LAB — IGG, IGA, IGM
IgG (Immunoglobin G), Serum: 780 mg/dL (ref 600–1640)
IgM, Serum: 79 mg/dL (ref 50–300)
Immunoglobulin A: 279 mg/dL (ref 47–310)

## 2023-03-07 LAB — HEPATITIS B SURFACE ANTIGEN: Hepatitis B Surface Ag: NONREACTIVE

## 2023-03-07 LAB — ANTI-SMOOTH MUSCLE ANTIBODY, IGG: Actin (Smooth Muscle) Antibody (IGG): <20 U (ref <20)

## 2023-03-08 ENCOUNTER — Ambulatory Visit (INDEPENDENT_AMBULATORY_CARE_PROVIDER_SITE_OTHER): Payer: Commercial Managed Care - PPO | Admitting: Adult Health

## 2023-03-08 ENCOUNTER — Encounter: Payer: Self-pay | Admitting: Adult Health

## 2023-03-08 VITALS — BP 150/94 | HR 73 | Ht 66.5 in | Wt 221.0 lb

## 2023-03-08 DIAGNOSIS — I10 Essential (primary) hypertension: Secondary | ICD-10-CM

## 2023-03-08 DIAGNOSIS — R232 Flushing: Secondary | ICD-10-CM

## 2023-03-08 DIAGNOSIS — Z79899 Other long term (current) drug therapy: Secondary | ICD-10-CM

## 2023-03-08 NOTE — Progress Notes (Signed)
  Subjective:     Patient ID: [name], female   DOB: 1965-12-07, 58 y.o.   MRN: [id number]  HPI [name] is a 58 year old white female, married, sp hysterectomy back in follow up on starting Vivelle -dot in October for hot flashes  and they are much better.  PCP is Dr Duanne  Review of Systems Hot flashes much better with the patch Has had stress at home Reviewed past medical,surgical, social and family history. Reviewed medications and allergies.     Objective:   Physical Exam BP (!) 150/94 (BP Location: Right Arm, Patient Position: Sitting, Cuff Size: Normal)   Pulse 73   Ht 5' 6.5 (1.689 m)   Wt 221 lb (100.2 kg)   BMI 35.14 kg/m     Skin warm and dry.  Lungs: clear to ausculation bilaterally. Cardiovascular: regular rate and rhythm.   Upstream - 03/08/23 9095       Pregnancy Intention Screening   Does the patient want to become pregnant in the next year? N/A    Does the patient's partner want to become pregnant in the next year? N/A    Would the patient like to discuss contraceptive options today? N/A      Contraception Wrap Up   Current Method Female Sterilization   hyst   End Method Female Sterilization   hyst   Contraception Counseling Provided No             Assessment:     1. Hot flashes Much better on the patch  2. Primary hypertension Take Norvasc  and follow up with PCP Had hard day yesterday  3. Current use of estrogen therapy (Primary) Happy with Vivelle -dot 0.1 mg 1 patch 2 x weekly, has refills     Plan:     Follow up in 3 months for ROS or sooner if needed

## 2023-03-14 ENCOUNTER — Other Ambulatory Visit: Payer: Self-pay

## 2023-03-14 ENCOUNTER — Other Ambulatory Visit: Payer: Self-pay | Admitting: *Deleted

## 2023-03-14 DIAGNOSIS — R7989 Other specified abnormal findings of blood chemistry: Secondary | ICD-10-CM

## 2023-03-14 DIAGNOSIS — R768 Other specified abnormal immunological findings in serum: Secondary | ICD-10-CM

## 2023-03-14 DIAGNOSIS — K760 Fatty (change of) liver, not elsewhere classified: Secondary | ICD-10-CM

## 2023-03-20 NOTE — Telephone Encounter (Signed)
 See patient note about her rheumatology referral.   [name] C. Please arrange for celiac genetic labs (celiac disease HLA DQ) at same time of other future labs. I would advise cancelling all and then reentering along with the genetic lab otherwise the lab may miss it.

## 2023-03-21 ENCOUNTER — Other Ambulatory Visit: Payer: Self-pay

## 2023-03-21 DIAGNOSIS — R7989 Other specified abnormal findings of blood chemistry: Secondary | ICD-10-CM

## 2023-03-21 DIAGNOSIS — R768 Other specified abnormal immunological findings in serum: Secondary | ICD-10-CM

## 2023-03-21 DIAGNOSIS — K760 Fatty (change of) liver, not elsewhere classified: Secondary | ICD-10-CM

## 2023-03-21 NOTE — Telephone Encounter (Signed)
 Referral sent to [facility] rheumatology

## 2023-04-05 NOTE — Progress Notes (Signed)
Cardiology Office Note:    Date:  04/09/2023   ID:  [name], DOB 29-Aug-1965, MRN [id number]  PCP:  Donita Brooks, MD   [facility] Providers Cardiologist:  None     Referring MD: Donita Brooks, MD   Chief Complaint  Patient presents with   Chest Pain   History of Present Illness:    [name] is a 58 y.o. female with a hx of seizures and HTN who is here today for follow up and chest pain, seen for Dr. Tenny Craw.   Ms. Dowding has been seen by Dr. Tenny Craw although she has not followed since 02/2019. At that time it was reccommended that she undergo a cardiac CT given chest pain and family hx of CAD. It appears this was ordered however never obtained. An echo stress test was also ordered which was also never completed. Prior exercise test from 06/2014 with low normal capacity with no evidence of ischemia.   She did undergo a treadmill echo stress 05/23/19 with no EKG changes and normal hyperdynamic LV function. BP elevated above baseline therefore amlodipine 2.5mg  was added to her regimen. Plan was for fasting lipids and close follow up. She has not been seen since that time.   Today she presents with complaints of recent chest pressure, headaches, fatigue, and DOE. BP is very elevated at 190/126. She reports that she has not taken her antihypertensives yet today and home SBPs reported in the 170-180 range. Discussed the need to get her BPs down and that her symptoms were related to sustained uncontrolled HTN. She is currently without acute symptoms therefore will attempt to manage outpatient with strict ED precautions reviewed. Otherwise she deniespalpitations, LE edema, orthopnea, PND, dizziness, or syncope. Denies bleeding in stool or urine.    Past Medical History:  Diagnosis Date   Anxiety    Bipolar disorder (HCC)    Depression    Diabetes mellitus without complication (HCC)    Type 2   Dyspnea    r/t anxiety   Encounter for neuropsychological testing    at [facility]  3/20-suggest possible somatization   Headache    Hypertension    Migraines    Pseudoseizure    Last seizure March/April 2024   PTSD (post-traumatic stress disorder)    Rosacea    Seizures (HCC)    [facility] (Dr. 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 1990s   ANTERIOR LAT LUMBAR FUSION N/A 08/03/2022   Procedure: Lumbar two-three extreme Lateral Interbody Fusion;  Surgeon: Barnett Abu, MD;  Location: [facility] OR;  Service: Neurosurgery;  Laterality: N/A;   APPENDECTOMY     BACK SURGERY     x2   CHOLECYSTECTOMY     COLONOSCOPY WITH PROPOFOL N/A 07/20/2021   Surgeon: Corbin Ade, MD;  Three 6-8 mm polyps in the descending colon and cecum removed, diverticulosis in the entire colon, otherwise normal exam.  Pathology with tubular adenomas.  Recommended 5-year surveillance.   CYST EXCISION     on ovaries   lumbar     ruptured disc in lumbar taken out   POLYPECTOMY  07/20/2021   Procedure: POLYPECTOMY;  Surgeon: Corbin Ade, MD;  Location: [facility];  Service: Endoscopy;;   ROTATOR CUFF REPAIR Left    TONSILLECTOMY  TUBAL LIGATION      Current Medications: Current Meds  Medication Sig   ACCU-CHEK GUIDE test strip USE AS DIRECTED TO MONITOR FSBS 1X DAILY. DX: R73.09.   amLODipine (NORVASC) 5 MG tablet Take 10 mg by mouth daily.   Blood Glucose Monitoring Suppl (BLOOD GLUCOSE SYSTEM PAK) [name] Please dispense based on patient and insurance preference. Use as directed to monitor FSBS 1x daily. Dx: R73.09.   buPROPion (WELLBUTRIN XL) 150 MG 24 hr tablet TAKE 1 TABLET BY MOUTH EVERY DAY   clotrimazole-betamethasone (LOTRISONE) cream APPLY TO AFFECTED AREA TWICE A DAY   estradiol (VIVELLE-DOT) 0.1 MG/24HR patch Place 1 patch (0.1 mg total) onto the skin 2 (two) times a week.    hydrochlorothiazide (HYDRODIURIL) 25 MG tablet Take 1 tablet (25 mg total) by mouth daily.   HYDROcodone-acetaminophen (NORCO) 10-325 MG tablet Take 1 tablet by mouth every 4 (four) hours as needed for severe pain.   Lancets MISC Please dispense based on patient and insurance preference. Use as directed to monitor FSBS 1x daily. Dx: R73.09.   losartan (COZAAR) 50 MG tablet Take 1 tablet (50 mg total) by mouth daily.   metoprolol tartrate (LOPRESSOR) 25 MG tablet Take 1 tablet (25 mg total) by mouth 2 (two) times daily.   ondansetron (ZOFRAN) 4 MG tablet TAKE 1 TABLET BY MOUTH EVERY 8 HOURS AS NEEDED FOR NAUSEA AND VOMITING   ORACEA 40 MG CPDR Take 40 mg by mouth daily.   rosuvastatin (CRESTOR) 10 MG tablet Take 1 tablet (10 mg total) by mouth daily.   venlafaxine XR (EFFEXOR-XR) 150 MG 24 hr capsule Take 2 capsules (300 mg total) by mouth daily with breakfast.   zonisamide (ZONEGRAN) 100 MG capsule Take 200mg  daily at bedtime     Allergies:   Penicillins, Toradol [ketorolac tromethamine], Hydromorphone, and Lamotrigine   Social History   Socioeconomic History   Marital status: Married    Spouse name: Not on file   Number of children: 2   Years of education: Associates    Highest education level: Not on file  Occupational History   Not on file  Tobacco Use   Smoking status: Former    Current packs/day: 0.00    Average packs/day: 0.5 packs/day for 10.0 years (5.0 ttl pk-yrs)    Types: Cigarettes    Start date: 05/14/1981    Quit date: 05/15/1991    Years since quitting: 31.9   Smokeless tobacco: Never  Vaping Use   Vaping status: Never Used  Substance and Sexual Activity   Alcohol use: Yes    Comment: socially   Drug use: No   Sexual activity: Yes    Birth control/protection: Surgical    Comment: hyst  Other Topics Concern   Not on file  Social History Narrative   Caffeine use: tea sometimes, soda sometimes   Right handed   Married x 5 years 01/27/21   1 son and 1  daughter. 5 grandchildren.    Social Drivers of Corporate investment banker Strain: Low Risk  (12/06/2022)   Overall Financial Resource Strain (CARDIA)    Difficulty of Paying Living Expenses: Not hard at all  Food Insecurity: No Food Insecurity (12/06/2022)   Hunger Vital Sign    Worried About Running Out of Food in the Last Year: Never true    Ran Out of Food in the Last Year: Never true  Transportation Needs: No Transportation Needs (12/06/2022)   PRAPARE - Administrator, Civil Service (  Medical): No    Lack of Transportation (Non-Medical): No  Physical Activity: Insufficiently Active (12/06/2022)   Exercise Vital Sign    Days of Exercise per Week: 1 day    Minutes of Exercise per Session: 20 min  Stress: Stress Concern Present (12/06/2022)   [employer] of Occupational Health - Occupational Stress Questionnaire    Feeling of Stress : To some extent  Social Connections: Moderately Integrated (12/06/2022)   Social Connection and Isolation Panel [NHANES]    Frequency of Communication with Friends and Family: More than three times a week    Frequency of Social Gatherings with Friends and Family: Once a week    Attends Religious Services: More than 4 times per year    Active Member of [employer] or Organizations: No    Attends Banker Meetings: Never    Marital Status: Married    Family History: The patient's family history includes Breast cancer in her cousin, cousin, cousin, and paternal aunt; Cancer in her cousin and maternal grandfather; Diabetes in her father, paternal grandfather, and paternal grandmother; Heart disease (age of onset: 74) in her father; Hypertension in her father. There is no history of Liver cancer.  ROS:   Please see the history of present illness.     All other systems reviewed and are negative.  EKGs/Labs/Other Studies Reviewed:    The following studies were reviewed today:  Cardiac Studies & Procedures     STRESS  TESTS  ECHOCARDIOGRAM STRESS TEST 05/23/2019  Narrative EXERCISE STRESS REPORT    Patient Name:   [name] Date of Exam: 05/23/2019 Medical Rec #:  [id number]        Height:       67.0 in Accession #:    [id number]       Weight:       232.0 lb Date of Birth:  [birth year]         BSA:          2.15 m Patient Age:    53 years         BP:           129/90 mmHg Patient Gender: F                HR:           107 bpm. [facility]   Procedure: Stress Echo  Indications:    chest pain  History:        Patient has no prior history of Echocardiogram examinations. Suicidal ideation, Left-sided weakness.  Sonographer:    Jeryl Columbia RDCS (AE) Referring Phys: [id number] PAULA V ROSS  Transthoracic Stress Echo for Chest Pain evaluation.  Stress Protocol: The patient exercised on a treadmill according to a Bruce protocol.  Protocol Termination:  Resting HR:               bpm Resting BP: 120/90 Target HR:                bpm Peak BP:    169/112 HR Achieved: was achieved  Patient Tolerance: The patient developed shortness of breath and chest pain during the stress exam.  EKG: The patient developed no abnormal EKG findings during exercise.  LV Pre-Stress Systolic Function: Pre-stress testing the left ventricular systolic function was see doc note ejection fraction.  LV Post-Stress Systolic Function: Post-stress testing, the left ventricular systolic function was see doc note ejection fraction.   HR  SysBP DiasBP Stage I  107  129    90 Stage II  153  169   112 Stage III 80   126    74   STRESS SUMMARY  Negative stress echo for ischemia. Normal LVEF at rest 60-65%. Appropriate augmentation of all segments with stress, post exercise LVEF 65-70% with no acute wall motion abnormalities. There were no stress-induced wall motion abnormalities. This is a negative stress echocardiogram for ischemia.  IMPRESSIONS   1. Left ventricular ejection fraction, by  estimation, is 60 to 65%. The left ventricle has normal function. The left ventricle has no regional wall motion abnormalities. Left ventricular diastolic function could not be evaluated. 2. This is a negative stress echocardiogram for ischemia. 3. Right ventricular systolic function is normal. The right ventricular size is normal. 4. The mitral valve was not assessed. not assessed mitral valve regurgitation. 5. Tricuspid valve regurgitation not assessed. 6. The aortic valve was not assessed. Aortic valve regurgitation not assessed. 7. Post-stress: left ventricular systolic function was see doc note. 8. Pre-stress: left ventricular systolic function was see doc note.  FINDINGS Left Ventricle: Left ventricular ejection fraction, by estimation, is 60 to 65%. The left ventricle has normal function. The left ventricle has no regional wall motion abnormalities. The left ventricular internal cavity size was normal in size. There is no. There is no left ventricular hypertrophy.  Right Ventricle: The right ventricular size is normal. No increase in right ventricular wall thickness. Right ventricular systolic function is normal.  Left Atrium: Left atrial size was not assessed.  Right Atrium: Right atrial size was not assessed.  Pericardium: The pericardium was not assessed.  Mitral Valve: The mitral valve was not assessed. Not assessed mitral valve regurgitation.  Tricuspid Valve: The tricuspid valve is not assessed. Tricuspid valve regurgitation not assessed.  Aortic Valve: The aortic valve was not assessed. Aortic valve regurgitation not assessed.  Pulmonic Valve: The pulmonic valve was not assessed. Pulmonic valve regurgitation not assessed.  Aorta: Aortic root could not be assessed.  Shunts: The interatrial septum was not assessed.   LEFT VENTRICLE          Normals PLAX 2D LVIDd:         4.65 cm  3.6 cm   Diastology                 Normals LVIDs:         2.60 cm  1.7 cm   LV e'  lateral:   8.59 cm/s 6.42 cm/s LV PW:         0.84 cm  1.4 cm   LV E/e' lateral: 11.8      15.4 LV IVS:        0.77 cm  1.3 cm   LV e' medial:    8.49 cm/s 6.96 cm/s LVOT diam:     1.90 cm  2.0 cm   LV E/e' medial:  11.9      6.96 LV SV:         75 ml    79 ml LV SV Index:   33.08    45 ml/m2 LVOT Area:     2.84 cm 3.14 cm2   LEFT ATRIUM         Index LA diam:    3.20 cm 1.49 cm/m  AORTA                 Normals Ao Root diam: 2.70 cm 31 mm  MITRAL VALVE  Normals MV Area (PHT): 3.91 cm              SHUNTS MV PHT:        56.26 msec 55 ms      Systemic Diam: 1.90 cm MV Decel Time: 194 msec   187 ms MV E velocity: 101.00 cm/s 103 cm/s MV A velocity: 66.00 cm/s  70.3 cm/s MV E/A ratio:  1.53        1.5   Dina Rich MD Electronically signed by Dina Rich MD Signature Date/Time: 05/23/2019/4:49:14 PM    Final    MONITORS  CARDIAC EVENT MONITOR 05/03/2015  Narrative Sinus rhythm  No arrhythmias noted          Recent Labs: 04/18/2022: TSH 1.12 01/16/2023: BUN 7; Creat 0.61; Potassium 3.6; Sodium 143 03/02/2023: ALT 27; Hemoglobin 14.0; Platelets 318   Recent Lipid Panel    Component Value Date/Time   CHOL 207 (H) 01/16/2023 0845   TRIG 123 01/16/2023 0845   HDL 59 01/16/2023 0845   CHOLHDL 3.5 01/16/2023 0845   VLDL 26 05/28/2019 0955   LDLCALC 125 (H) 01/16/2023 0845   Physical Exam:    VS:  BP (!) 190/126   Pulse 97   Ht 5' 6.5" (1.689 m)   Wt 217 lb 12.8 oz (98.8 kg)   SpO2 98%   BMI 34.63 kg/m     Wt Readings from Last 3 Encounters:  04/09/23 217 lb 12.8 oz (98.8 kg)  03/08/23 221 lb (100.2 kg)  01/16/23 218 lb (98.9 kg)    General: Well developed, well nourished, NAD Lungs:Clear to ausculation bilaterally. No wheezes, rales, or rhonchi. Breathing is unlabored. Cardiovascular: RRR with S1 S2. No murmurs Extremities: No edema.  Neuro: Alert and oriented. No focal deficits. No facial asymmetry. MAE spontaneously. Psych: Responds  to questions appropriately with normal affect.    ASSESSMENT/PLAN:    Uncontrolled HTN: Reports recent chest pressure, headaches, and fatigue with a BP of 190/126 and reports of home SBPs in the 170-180 range. Discussed referring to the ED for hospital admission versus OP management with medication titration. Strict ED precautions reviewed and patient prefers titration with close follow up. Plan to continue amlodipine 10mg  daily, hydrochlorothiazide 25mg  daily. Will add losartan 50mg  daily and metoprolol 25mg  BID. She has been scheduled close follow up with myself. Instructed on proper BP technique and to keep BP log daily for tracking.   Chest pain: Echo stress test from 05/2019 with no EKG changes or evidence of ischemia. Plan aggressive hypertension control. Will obtain echocardiogram to assess LV function with prolonged HTN. If chest pain persists despite good BP control, will plan cardiac CTA as previously planned by Dr. Tenny Craw.    Medication Adjustments/Labs and Tests Ordered: Current medicines are reviewed at length with the patient today.  Concerns regarding medicines are outlined above.  Orders Placed This Encounter  Procedures   ECHOCARDIOGRAM COMPLETE   Meds ordered this encounter  Medications   losartan (COZAAR) 50 MG tablet    Sig: Take 1 tablet (50 mg total) by mouth daily.    Dispense:  30 tablet    Refill:  6   metoprolol tartrate (LOPRESSOR) 25 MG tablet    Sig: Take 1 tablet (25 mg total) by mouth 2 (two) times daily.    Dispense:  60 tablet    Refill:  6    Patient Instructions  Medication Instructions:  Please start Losartan 50 mg a day. Start Metoprolol Tartrate 25 mg one tablet  twice a day. Continue all other medications as listed.  *If you need a refill on your cardiac medications before your next appointment, please call your pharmacy*  Please keep a blood pressure diary and forward results to Wyline Beady through [facility].  Testing: Your physician has  requested that you have an echocardiogram. Echocardiography is a painless test that uses sound waves to create images of your heart. It provides your doctor with information about the size and shape of your heart and how well your heart's chambers and valves are working. This procedure takes approximately one hour. There are no restrictions for this procedure. Please do NOT wear cologne, perfume, aftershave, or lotions (deodorant is allowed). Please arrive 15 minutes prior to your appointment time.  Please note: We ask at that you not bring children with you during ultrasound (echo/ vascular) testing. Due to room size and safety concerns, children are not allowed in the ultrasound rooms during exams. Our front office staff cannot provide observation of children in our lobby area while testing is being conducted. An adult accompanying a patient to their appointment will only be allowed in the ultrasound room at the discretion of the ultrasound technician under special circumstances. We apologize for any inconvenience.  Follow-Up: At [facility], you and your health needs are our priority.  As part of our continuing mission to provide you with exceptional heart care, we have created designated Provider Care Teams.  These Care Teams include your primary Cardiologist (physician) and Advanced Practice Providers (APPs -  Physician Assistants and Nurse Practitioners) who all work together to provide you with the care you need, when you need it.  We recommend signing up for the patient portal called "MyChart".  Sign up information is provided on this After Visit Summary.  MyChart is used to connect with patients for Virtual Visits (Telemedicine).  Patients are able to view lab/test results, encounter notes, upcoming appointments, etc.  Non-urgent messages can be sent to your provider as well.   To learn more about what you can do with MyChart, go to [email or web address].    Your next appointment:    Follow up with Georgie Chard as scheduled.         Signed, Georgie Chard, NP  04/09/2023 6:29 PM    [facility]

## 2023-04-09 ENCOUNTER — Encounter: Payer: Self-pay | Admitting: Cardiology

## 2023-04-09 ENCOUNTER — Ambulatory Visit: Payer: Commercial Managed Care - PPO | Attending: Cardiology | Admitting: Cardiology

## 2023-04-09 VITALS — BP 190/126 | HR 97 | Ht 66.5 in | Wt 217.8 lb

## 2023-04-09 DIAGNOSIS — I10 Essential (primary) hypertension: Secondary | ICD-10-CM | POA: Diagnosis not present

## 2023-04-09 DIAGNOSIS — Z0289 Encounter for other administrative examinations: Secondary | ICD-10-CM

## 2023-04-09 DIAGNOSIS — R011 Cardiac murmur, unspecified: Secondary | ICD-10-CM

## 2023-04-09 DIAGNOSIS — R079 Chest pain, unspecified: Secondary | ICD-10-CM

## 2023-04-09 MED ORDER — METOPROLOL TARTRATE 25 MG PO TABS: 25.0000 mg | ORAL_TABLET | Freq: Two times a day (BID) | ORAL | 6 refills | Status: DC

## 2023-04-09 MED ORDER — LOSARTAN POTASSIUM 50 MG PO TABS: 50.0000 mg | ORAL_TABLET | Freq: Every day | ORAL | 6 refills | Status: DC

## 2023-04-09 NOTE — Patient Instructions (Addendum)
Medication Instructions:  Please start Losartan 50 mg a day. Start Metoprolol Tartrate 25 mg one tablet twice a day. Continue all other medications as listed.  *If you need a refill on your cardiac medications before your next appointment, please call your pharmacy*  Please keep a blood pressure diary and forward results to [name] through [place].  Testing: Your physician has requested that you have an echocardiogram. Echocardiography is a painless test that uses sound waves to create images of your heart. It provides your doctor with information about the size and shape of your heart and how well your heart's chambers and valves are working. This procedure takes approximately one hour. There are no restrictions for this procedure. Please do NOT wear cologne, perfume, aftershave, or lotions (deodorant is allowed). Please arrive 15 minutes prior to your appointment time.  Please note: We ask at that you not bring children with you during ultrasound (echo/ vascular) testing. Due to room size and safety concerns, children are not allowed in the ultrasound rooms during exams. Our front office staff cannot provide observation of children in our lobby area while testing is being conducted. An adult accompanying a patient to their appointment will only be allowed in the ultrasound room at the discretion of the ultrasound technician under special circumstances. We apologize for any inconvenience.  Follow-Up: At [facility], you and your health needs are our priority.  As part of our continuing mission to provide you with exceptional heart care, we have created designated Provider Care Teams.  These Care Teams include your primary Cardiologist (physician) and Advanced Practice Providers (APPs -  Physician Assistants and Nurse Practitioners) who all work together to provide you with the care you need, when you need it.  We recommend signing up for the patient portal called "MyChart".  Sign  up information is provided on this After Visit Summary.  MyChart is used to connect with patients for Virtual Visits (Telemedicine).  Patients are able to view lab/test results, encounter notes, upcoming appointments, etc.  Non-urgent messages can be sent to your provider as well.   To learn more about what you can do with MyChart, go to [email or web address].    Your next appointment:   Follow up with [name] as scheduled.

## 2023-04-10 ENCOUNTER — Telehealth: Payer: Self-pay | Admitting: Cardiology

## 2023-04-10 MED ORDER — METOPROLOL TARTRATE 25 MG PO TABS: 25.0000 mg | ORAL_TABLET | Freq: Two times a day (BID) | ORAL | 3 refills | Status: DC

## 2023-04-10 MED ORDER — LOSARTAN POTASSIUM 50 MG PO TABS: 50.0000 mg | ORAL_TABLET | Freq: Every day | ORAL | 3 refills | Status: AC

## 2023-04-10 NOTE — Telephone Encounter (Signed)
 Pt c/o medication issue:  1. Name of Medication: losartan  (COZAAR ) 50 MG tablet  metoprolol  tartrate (LOPRESSOR ) 25 MG tablet  2. How are you currently taking this medication (dosage and times per day)? As written  3. Are you having a reaction (difficulty breathing--STAT)? No   4. What is your medication issue? Pharmacy declined medication because of NPI # per pt.

## 2023-04-10 NOTE — Telephone Encounter (Signed)
 Pt's medications were sent to pt's pharmacy as requested. Confirmation received.

## 2023-04-19 NOTE — Progress Notes (Deleted)
 Cardiology Office Note:    Date:  04/19/2023   ID:  [name], DOB 02-26-1966, MRN [id number]  PCP:  Donita Brooks, MD   [facility] Providers Cardiologist:  None { Click to update primary MD,subspecialty MD or APP then REFRESH:1}    Referring MD: Donita Brooks, MD   No chief complaint on file. ***  History of Present Illness:    [name] is a 58 y.o. female with a hx of   of seizures and HTN who is here today for follow up and chest pain, seen for Dr. Tenny Craw.    [name] has been seen by Dr. Tenny Craw although she has not followed since 02/2019. At that time it was reccommended that she undergo a cardiac CT given chest pain and family hx of CAD. It appears this was ordered however never obtained. An echo stress test was also ordered which was also never completed. Prior exercise test from 06/2014 with low normal capacity with no evidence of ischemia.    She did undergo a treadmill echo stress 05/23/19 with no EKG changes and normal hyperdynamic LV function. BP elevated above baseline therefore amlodipine 2.5mg  was added to her regimen. Plan was for fasting lipids and close follow up. She has not been seen since that time.    Today she presents with complaints of recent chest pressure, headaches, fatigue, and DOE. BP is very elevated at 190/126. She reports that she has not taken her antihypertensives yet today and home SBPs reported in the 170-180 range. Discussed the need to get her BPs down and that her symptoms were related to sustained uncontrolled HTN. She is currently without acute symptoms therefore will attempt to manage outpatient with strict ED precautions reviewed. Otherwise she deniespalpitations, LE edema, orthopnea, PND, dizziness, or syncope. Denies bleeding in stool or urine.     Uncontrolled HTN: Reports recent chest pressure, headaches, and fatigue with a BP of 190/126 and reports of home SBPs in the 170-180 range. Discussed referring to the ED for  hospital admission versus OP management with medication titration. Strict ED precautions reviewed and patient prefers titration with close follow up. Plan to continue amlodipine 10mg  daily, hydrochlorothiazide 25mg  daily. Will add losartan 50mg  daily and metoprolol 25mg  BID. She has been scheduled close follow up with myself. Instructed on proper BP technique and to keep BP log daily for tracking.    Chest pain: Echo stress test from 05/2019 with no EKG changes or evidence of ischemia. Plan aggressive hypertension control. Will obtain echocardiogram to assess LV function with prolonged HTN. If chest pain persists despite good BP control, will plan cardiac CTA as previously planned by Dr. Tenny Craw.       Past Medical History:  Diagnosis Date   Anxiety    Bipolar disorder (HCC)    Depression    Diabetes mellitus without complication (HCC)    Type 2   Dyspnea    r/t anxiety   Encounter for neuropsychological testing    at [facility] 3/20-suggest possible somatization   Headache    Hypertension    Migraines    Pseudoseizure    Last seizure March/April 2024   PTSD (post-traumatic stress disorder)    Rosacea    Seizures (HCC)    [facility] (Dr. 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 1990s   ANTERIOR LAT LUMBAR FUSION N/A 08/03/2022   Procedure: Lumbar two-three extreme Lateral Interbody Fusion;  Surgeon: Barnett Abu, MD;  Location: [facility] OR;  Service: Neurosurgery;  Laterality: N/A;   APPENDECTOMY     BACK SURGERY     x2   CHOLECYSTECTOMY     COLONOSCOPY WITH PROPOFOL N/A 07/20/2021   Surgeon: Corbin Ade, MD;  Three 6-8 mm polyps in the descending colon and cecum removed, diverticulosis in the entire colon, otherwise normal exam.  Pathology with tubular adenomas.  Recommended 5-year  surveillance.   CYST EXCISION     on ovaries   lumbar     ruptured disc in lumbar taken out   POLYPECTOMY  07/20/2021   Procedure: POLYPECTOMY;  Surgeon: Corbin Ade, MD;  Location: [facility] SUITE;  Service: Endoscopy;;   ROTATOR CUFF REPAIR Left    TONSILLECTOMY     TUBAL LIGATION      Current Medications: No outpatient medications have been marked as taking for the 04/23/23 encounter (Appointment) with Filbert Schilder, NP.     Allergies:   Penicillins, Toradol [ketorolac tromethamine], Hydromorphone, and Lamotrigine   Social History   Socioeconomic History   Marital status: Married    Spouse name: Not on file   Number of children: 2   Years of education: Associates    Highest education level: Not on file  Occupational History   Not on file  Tobacco Use   Smoking status: Former    Current packs/day: 0.00    Average packs/day: 0.5 packs/day for 10.0 years (5.0 ttl pk-yrs)    Types: Cigarettes    Start date: 05/14/1981    Quit date: 05/15/1991    Years since quitting: 31.9   Smokeless tobacco: Never  Vaping Use   Vaping status: Never Used  Substance and Sexual Activity   Alcohol use: Yes    Comment: socially   Drug use: No   Sexual activity: Yes    Birth control/protection: Surgical    Comment: hyst  Other Topics Concern   Not on file  Social History Narrative   Caffeine use: tea sometimes, soda sometimes   Right handed   Married x 5 years 01/27/21   1 son and 1 daughter. 5 grandchildren.    Social Drivers of Corporate investment banker Strain: Low Risk  (12/06/2022)   Overall Financial Resource Strain (CARDIA)    Difficulty of Paying Living Expenses: Not hard at all  Food Insecurity: No Food Insecurity (12/06/2022)   Hunger Vital Sign    Worried About Running Out of Food in the Last Year: Never true    Ran Out of Food in the Last Year: Never true  Transportation Needs: No Transportation Needs (12/06/2022)   PRAPARE - Scientist, research (physical sciences) (Medical): No    Lack of Transportation (Non-Medical): No  Physical Activity: Insufficiently Active (12/06/2022)   Exercise Vital Sign    Days of Exercise per Week: 1 day    Minutes of Exercise per Session: 20 min  Stress: Stress Concern Present (12/06/2022)   [employer] of Occupational Health - Occupational Stress Questionnaire    Feeling of Stress : To some extent  Social Connections: Moderately Integrated (12/06/2022)   Social Connection and Isolation Panel [NHANES]    Frequency of Communication with Friends and Family: More than three times a week    Frequency of Social Gatherings with Friends and Family: Once a week  Attends Religious Services: More than 4 times per year    Active Member of Clubs or Organizations: No    Attends Banker Meetings: Never    Marital Status: Married     Family History: The patient's ***family history includes Breast cancer in her cousin, cousin, cousin, and paternal aunt; Cancer in her cousin and maternal grandfather; Diabetes in her father, paternal grandfather, and paternal grandmother; Heart disease (age of onset: 53) in her father; Hypertension in her father. There is no history of Liver cancer.  ROS:   Please see the history of present illness.    *** All other systems reviewed and are negative.  EKGs/Labs/Other Studies Reviewed:    The following studies were reviewed today: ***      Recent Labs: 01/16/2023: BUN 7; Creat 0.61; Potassium 3.6; Sodium 143 03/02/2023: ALT 27; Hemoglobin 14.0; Platelets 318  Recent Lipid Panel    Component Value Date/Time   CHOL 207 (H) 01/16/2023 0845   TRIG 123 01/16/2023 0845   HDL 59 01/16/2023 0845   CHOLHDL 3.5 01/16/2023 0845   VLDL 26 05/28/2019 0955   LDLCALC 125 (H) 01/16/2023 0845     Risk Assessment/Calculations:   {Does this patient have ATRIAL FIBRILLATION?[phone]}  No BP recorded.  {Refresh Note OR Click here to enter BP  :1}***          Physical Exam:    VS:  There were no vitals taken for this visit.    Wt Readings from Last 3 Encounters:  04/09/23 217 lb 12.8 oz (98.8 kg)  03/08/23 221 lb (100.2 kg)  01/16/23 218 lb (98.9 kg)     GEN: *** Well nourished, well developed in no acute distress HEENT: Normal NECK: No JVD; No carotid bruits LYMPHATICS: No lymphadenopathy CARDIAC: ***RRR, no murmurs, rubs, gallops RESPIRATORY:  Clear to auscultation without rales, wheezing or rhonchi  ABDOMEN: Soft, non-tender, non-distended MUSCULOSKELETAL:  No edema; No deformity  SKIN: Warm and dry NEUROLOGIC:  Alert and oriented x 3 PSYCHIATRIC:  Normal affect   ASSESSMENT:    No diagnosis found. PLAN:    In order of problems listed above:  ***      {Are you ordering a CV Procedure (e.g. stress test, cath, DCCV, TEE, etc)?   Press F2        :[id number]}    Medication Adjustments/Labs and Tests Ordered: Current medicines are reviewed at length with the patient today.  Concerns regarding medicines are outlined above.  No orders of the defined types were placed in this encounter.  No orders of the defined types were placed in this encounter.   There are no Patient Instructions on file for this visit.   Signed, Georgie Chard, NP  04/19/2023 2:31 PM    [facility]

## 2023-04-23 ENCOUNTER — Ambulatory Visit: Payer: Medicare Other | Admitting: Cardiology

## 2023-04-25 ENCOUNTER — Ambulatory Visit (HOSPITAL_COMMUNITY)
Admission: RE | Admit: 2023-04-25 | Discharge: 2023-04-25 | Disposition: A | Discharge status: Home / Self Care | Payer: Commercial Managed Care - PPO | Source: Ambulatory Visit | Attending: Cardiology | Admitting: Cardiology

## 2023-04-25 DIAGNOSIS — R011 Cardiac murmur, unspecified: Secondary | ICD-10-CM | POA: Diagnosis present

## 2023-04-25 DIAGNOSIS — I10 Essential (primary) hypertension: Secondary | ICD-10-CM | POA: Diagnosis present

## 2023-04-25 LAB — ECHOCARDIOGRAM COMPLETE
AR max vel: 2.17 cm2
AV Area VTI: 2.37 cm2
AV Area mean vel: 2.07 cm2
AV Mean grad: 4 mm[Hg]
AV Peak grad: 7.5 mm[Hg]
Ao pk vel: 1.37 m/s
Area-P 1/2: 3.6 cm2
S' Lateral: 2.7 cm

## 2023-04-25 NOTE — Progress Notes (Signed)
*  PRELIMINARY RESULTS* Echocardiogram 2D Echocardiogram has been performed.  Earlie Server Jerre Diguglielmo 04/25/2023, 9:07 AM

## 2023-05-02 ENCOUNTER — Ambulatory Visit: Payer: Commercial Managed Care - PPO | Attending: Cardiology | Admitting: Cardiology

## 2023-05-02 ENCOUNTER — Encounter: Payer: Self-pay | Admitting: Cardiology

## 2023-05-02 VITALS — BP 120/88 | HR 78 | Ht 66.5 in | Wt 216.0 lb

## 2023-05-02 DIAGNOSIS — R079 Chest pain, unspecified: Secondary | ICD-10-CM | POA: Diagnosis not present

## 2023-05-02 DIAGNOSIS — R011 Cardiac murmur, unspecified: Secondary | ICD-10-CM | POA: Diagnosis not present

## 2023-05-02 DIAGNOSIS — I10 Essential (primary) hypertension: Secondary | ICD-10-CM

## 2023-05-02 NOTE — Patient Instructions (Signed)
 Medication Instructions:  Your physician recommends that you continue on your current medications as directed. Please refer to the Current Medication list given to you today.  *If you need a refill on your cardiac medications before your next appointment, please call your pharmacy*   Lab Work: TODAY:  BMET  If you have labs (blood work) drawn today and your tests are completely normal, you will receive your results only by: MyChart Message (if you have MyChart) OR A paper copy in the mail If you have any lab test that is abnormal or we need to change your treatment, we will call you to review the results.   Testing/Procedures: Your physician has requested that you have cardiac CT. Cardiac computed tomography (CT) is a painless test that uses an x-ray machine to take clear, detailed pictures of your heart. For further information please visit [email or web address] Please follow instruction sheet  BELOW:    Your cardiac CT will be scheduled at one of the below locations:   [facility] [address] [place], [place] [address] [phone]  OR  [facility] [address] [place], [place] [address] [phone]  OR   [facility] [address] [place], [place] [address] [phone]  OR   [facility] [address] [place], [place] [address] [phone]  If scheduled at [facility], please arrive at the [facility] and Children's Entrance (Entrance C2) of [facility] 30 minutes prior to test start time. You can use the FREE valet parking offered at entrance C (encouraged to control the heart rate for the test)  Proceed to the [facility] Radiology Department (first floor) to check-in and test prep.  All radiology patients and guests should use entrance C2 at [facility], accessed from [facility], even though the hospital's physical address  listed is [address]    If scheduled at [facility] or [facility], please arrive 15 mins early for check-in and test prep.  There is spacious parking and easy access to the radiology department from the [facility] entrance. Please enter here and check-in with the desk attendant.   If scheduled at [facility], please arrive 30 minutes early for check-in and test prep.  Please follow these instructions carefully (unless otherwise directed):  An IV will be required for this test and Nitroglycerin will be given.  Hold all erectile dysfunction medications at least 3 days (72 hrs) prior to test. (Ie viagra, cialis, sildenafil, tadalafil, etc)   On the Night Before the Test: Be sure to Drink plenty of water. Do not consume any caffeinated/decaffeinated beverages or chocolate 12 hours prior to your test. Do not take any antihistamines 12 hours prior to your test.   On the Day of the Test: Drink plenty of water until 1 hour prior to the test. Do not eat any food 1 hour prior to test. You may take your regular medications prior to the test.  Take metoprolol (Lopressor) 100 MG (4 OF YOUR 25 MG TABLETS) two hours prior to test. THIS WILL BE ALL THE LOPRESSOR YOU TAKE THAT DAY If you take Furosemide/Hydrochlorothiazide/Spironolactone/Chlorthalidone, please HOLD on the morning of the test. Patients who wear a continuous glucose monitor MUST remove the device prior to scanning. FEMALES- please wear underwire-free bra if available, avoid dresses & tight clothing  After the Test: Drink plenty of water. After receiving  IV contrast, you may experience a mild flushed feeling. This is normal. On occasion, you may experience a mild rash up to 24 hours after the test. This is not dangerous. If this occurs, you can take Benadryl 25 mg, Zyrtec, Claritin, or Allegra and increase your fluid intake. (Patients taking Tikosyn  should avoid Benadryl, and may take Zyrtec, Claritin, or Allegra) If you experience trouble breathing, this can be serious. If it is severe call 911 IMMEDIATELY. If it is mild, please call our office.  We will call to schedule your test 2-4 weeks out understanding that some insurance companies will need an authorization prior to the service being performed.   For more information and frequently asked questions, please visit our website : [email or web address]  For non-scheduling related questions, please contact the cardiac imaging nurse navigator should you have any questions/concerns: Cardiac Imaging Nurse Navigators Direct Office Dial: [phone]   For scheduling needs, including cancellations and rescheduling, please call [place], [phone].      Follow-Up: At [facility], you and your health needs are our priority.  As part of our continuing mission to provide you with exceptional heart care, we have created designated Provider Care Teams.  These Care Teams include your primary Cardiologist (physician) and Advanced Practice Providers (APPs -  Physician Assistants and Nurse Practitioners) who all work together to provide you with the care you need, when you need it.  We recommend signing up for the patient portal called "MyChart".  Sign up information is provided on this After Visit Summary.  MyChart is used to connect with patients for Virtual Visits (Telemedicine).  Patients are able to view lab/test results, encounter notes, upcoming appointments, etc.  Non-urgent messages can be sent to your provider as well.   To learn more about what you can do with MyChart, go to [email or web address].    Your next appointment:   As scheduled   Provider:   Dietrich Pates, MD    Other Instructions

## 2023-05-02 NOTE — Progress Notes (Signed)
 Cardiology Office Note:    Date:  05/02/2023   ID:  [name], DOB 1965-06-01, MRN [id number]  PCP:  Donita Brooks, MD   [facility] Providers Cardiologist:  None     Referring MD: Donita Brooks, MD   Chief Complaint  Patient presents with   Follow-up   History of Present Illness:    [name] is a 58 y.o. female with a hx of seizures and HTN who is here today for follow up and chest pain, seen for Dr. Tenny Craw.    [name] has been seen by Dr. Tenny Craw although she has not followed since 02/2019. At that time it was reccommended that she undergo a cardiac CT given chest pain and family hx of CAD. It appears this was ordered however never obtained. An echo stress test was also ordered which was also never completed. Prior exercise test from 06/2014 with low normal capacity with no evidence of ischemia.    She did undergo a treadmill echo stress 05/23/19 with no EKG changes and normal hyperdynamic LV function. BP elevated above baseline therefore amlodipine 2.5mg  was added to her regimen. Plan was for fasting lipids and close follow up. She has not been seen since that time.    In follow up with myself she had c/o chest pressure, headaches, fatigue, and DOE although BP noted to be markedly elevated at 190/126. She reported typical home readings in the 170-180 range. Amlodipine 10mg  daily, hydrochlorothiazide 25mg  daily were continued and losartan 50mg  daily and metoprolol 25mg  BID were added to her regimen with plans for close follow up  Today she comes alone and reports home BPs are much improved. BP today in the office at 120/88. She continues to have mild chest pain and headaches however this also has improved. Discussed plan previously established with Dr. Tenny Craw to obtain cCTA which the patient agrees. Otherwise no SOB, palpitations, LE edema, orthopnea, PND, dizziness, or syncope. Denies bleeding in stool or urine.    Past Medical History:  Diagnosis Date   Anxiety     Bipolar disorder (HCC)    Depression    Diabetes mellitus without complication (HCC)    Type 2   Dyspnea    r/t anxiety   Encounter for neuropsychological testing    at [facility] 3/20-suggest possible somatization   Headache    Hypertension    Migraines    Pseudoseizure    Last seizure March/April 2024   PTSD (post-traumatic stress disorder)    Rosacea    Seizures (HCC)    [facility] (Dr. 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 1990s   ANTERIOR LAT LUMBAR FUSION N/A 08/03/2022   Procedure: Lumbar two-three extreme Lateral Interbody Fusion;  Surgeon: Barnett Abu, MD;  Location: [facility] OR;  Service: Neurosurgery;  Laterality: N/A;   APPENDECTOMY     BACK SURGERY     x2   CHOLECYSTECTOMY     COLONOSCOPY WITH PROPOFOL N/A 07/20/2021   Surgeon: Corbin Ade, MD;  Three 6-8 mm polyps in the descending colon and cecum removed, diverticulosis in the entire colon, otherwise normal exam.  Pathology with tubular adenomas.  Recommended 5-year surveillance.   CYST EXCISION     on ovaries   lumbar     ruptured disc in  lumbar taken out   POLYPECTOMY  07/20/2021   Procedure: POLYPECTOMY;  Surgeon: Corbin Ade, MD;  Location: [facility]ITE;  Service: Endoscopy;;   ROTATOR CUFF REPAIR Left    TONSILLECTOMY     TUBAL LIGATION      Current Medications: Current Meds  Medication Sig   ACCU-CHEK GUIDE test strip USE AS DIRECTED TO MONITOR FSBS 1X DAILY. DX: R73.09.   amLODipine (NORVASC) 5 MG tablet Take 10 mg by mouth daily.   Blood Glucose Monitoring Suppl (BLOOD GLUCOSE SYSTEM PAK) KIT Please dispense based on patient and insurance preference. Use as directed to monitor FSBS 1x daily. Dx: R73.09.   buPROPion (WELLBUTRIN XL) 150 MG 24 hr tablet TAKE 1 TABLET BY MOUTH EVERY DAY    clotrimazole-betamethasone (LOTRISONE) cream APPLY TO AFFECTED AREA TWICE A DAY   estradiol (VIVELLE-DOT) 0.1 MG/24HR patch Place 1 patch (0.1 mg total) onto the skin 2 (two) times a week.   hydrochlorothiazide (HYDRODIURIL) 25 MG tablet Take 1 tablet (25 mg total) by mouth daily.   HYDROcodone-acetaminophen (NORCO) 10-325 MG tablet Take 1 tablet by mouth every 4 (four) hours as needed for severe pain.   Lancets MISC Please dispense based on patient and insurance preference. Use as directed to monitor FSBS 1x daily. Dx: R73.09.   losartan (COZAAR) 50 MG tablet Take 1 tablet (50 mg total) by mouth daily.   metoprolol tartrate (LOPRESSOR) 25 MG tablet Take 1 tablet (25 mg total) by mouth 2 (two) times daily.   ondansetron (ZOFRAN) 4 MG tablet TAKE 1 TABLET BY MOUTH EVERY 8 HOURS AS NEEDED FOR NAUSEA AND VOMITING   ORACEA 40 MG CPDR Take 40 mg by mouth daily.   rosuvastatin (CRESTOR) 10 MG tablet Take 1 tablet (10 mg total) by mouth daily.   Semaglutide (RYBELSUS) 3 MG TABS Take 1 tablet (3 mg total) by mouth daily.   venlafaxine XR (EFFEXOR-XR) 150 MG 24 hr capsule Take 2 capsules (300 mg total) by mouth daily with breakfast.   zonisamide (ZONEGRAN) 100 MG capsule Take 200mg  daily at bedtime     Allergies:   Penicillins, Toradol [ketorolac tromethamine], Hydromorphone, and Lamotrigine   Social History   Socioeconomic History   Marital status: Married    Spouse name: Not on file   Number of children: 2   Years of education: Associates    Highest education level: Not on file  Occupational History   Not on file  Tobacco Use   Smoking status: Former    Current packs/day: 0.00    Average packs/day: 0.5 packs/day for 10.0 years (5.0 ttl pk-yrs)    Types: Cigarettes    Start date: 05/14/1981    Quit date: 05/15/1991    Years since quitting: 31.9   Smokeless tobacco: Never  Vaping Use   Vaping status: Never Used  Substance and Sexual Activity   Alcohol use: Yes    Comment: socially    Drug use: No   Sexual activity: Yes    Birth control/protection: Surgical    Comment: hyst  Other Topics Concern   Not on file  Social History Narrative   Caffeine use: tea sometimes, soda sometimes   Right handed   Married x 5 years 01/27/21   1 son and 1 daughter. 5 grandchildren.    Social Drivers of Corporate investment banker Strain: Low Risk  (12/06/2022)   Overall Financial Resource Strain (CARDIA)    Difficulty of Paying Living Expenses: Not hard at all  Food  Insecurity: No Food Insecurity (12/06/2022)   Hunger Vital Sign    Worried About Running Out of Food in the Last Year: Never true    Ran Out of Food in the Last Year: Never true  Transportation Needs: No Transportation Needs (12/06/2022)   PRAPARE - Administrator, Civil Service (Medical): No    Lack of Transportation (Non-Medical): No  Physical Activity: Insufficiently Active (12/06/2022)   Exercise Vital Sign    Days of Exercise per Week: 1 day    Minutes of Exercise per Session: 20 min  Stress: Stress Concern Present (12/06/2022)   [employer] of Occupational Health - Occupational Stress Questionnaire    Feeling of Stress : To some extent  Social Connections: Moderately Integrated (12/06/2022)   Social Connection and Isolation Panel [NHANES]    Frequency of Communication with Friends and Family: More than three times a week    Frequency of Social Gatherings with Friends and Family: Once a week    Attends Religious Services: More than 4 times per year    Active Member of [employer] or Organizations: No    Attends Banker Meetings: Never    Marital Status: Married    Family History: The patient's family history includes Breast cancer in her cousin, cousin, cousin, and paternal aunt; Cancer in her cousin and maternal grandfather; Diabetes in her father, paternal grandfather, and paternal grandmother; Heart disease (age of onset: 31) in her father; Hypertension in her father. There is no  history of Liver cancer.  ROS:   Please see the history of present illness.     All other systems reviewed and are negative.  EKGs/Labs/Other Studies Reviewed:    The following studies were reviewed today: Cardiac Studies & Procedures   ______________________________________________________________________________________________   STRESS TESTS  ECHOCARDIOGRAM STRESS TEST 05/23/2019  Narrative EXERCISE STRESS REPORT    Patient Name:   QUANTIA GRULLON Date of Exam: 05/23/2019 Medical Rec #:  [id number]        Height:       67.0 in Accession #:    [id number]       Weight:       232.0 lb Date of Birth:  1965/10/01         BSA:          2.15 m Patient Age:    53 years         BP:           129/90 mmHg Patient Gender: F                HR:           107 bpm. [facility]   Procedure: Stress Echo  Indications:    chest pain  History:        Patient has no prior history of Echocardiogram examinations. Suicidal ideation, Left-sided weakness.  Sonographer:    Jeryl Columbia RDCS (AE) Referring Phys: [id number] PAULA V ROSS  Transthoracic Stress Echo for Chest Pain evaluation.  Stress Protocol: The patient exercised on a treadmill according to a Bruce protocol.  Protocol Termination:  Resting HR:               bpm Resting BP: 120/90 Target HR:                bpm Peak BP:    169/112 HR Achieved: was achieved  Patient Tolerance: The patient developed shortness of breath and chest pain during the stress  exam.  EKG: The patient developed no abnormal EKG findings during exercise.  LV Pre-Stress Systolic Function: Pre-stress testing the left ventricular systolic function was see doc note ejection fraction.  LV Post-Stress Systolic Function: Post-stress testing, the left ventricular systolic function was see doc note ejection fraction.   HR  SysBP DiasBP Stage I   107  129    90 Stage II  153  169   112 Stage III 80   126    74   STRESS SUMMARY  Negative stress  echo for ischemia. Normal LVEF at rest 60-65%. Appropriate augmentation of all segments with stress, post exercise LVEF 65-70% with no acute wall motion abnormalities. There were no stress-induced wall motion abnormalities. This is a negative stress echocardiogram for ischemia.  IMPRESSIONS   1. Left ventricular ejection fraction, by estimation, is 60 to 65%. The left ventricle has normal function. The left ventricle has no regional wall motion abnormalities. Left ventricular diastolic function could not be evaluated. 2. This is a negative stress echocardiogram for ischemia. 3. Right ventricular systolic function is normal. The right ventricular size is normal. 4. The mitral valve was not assessed. not assessed mitral valve regurgitation. 5. Tricuspid valve regurgitation not assessed. 6. The aortic valve was not assessed. Aortic valve regurgitation not assessed. 7. Post-stress: left ventricular systolic function was see doc note. 8. Pre-stress: left ventricular systolic function was see doc note.  FINDINGS Left Ventricle: Left ventricular ejection fraction, by estimation, is 60 to 65%. The left ventricle has normal function. The left ventricle has no regional wall motion abnormalities. The left ventricular internal cavity size was normal in size. There is no. There is no left ventricular hypertrophy.  Right Ventricle: The right ventricular size is normal. No increase in right ventricular wall thickness. Right ventricular systolic function is normal.  Left Atrium: Left atrial size was not assessed.  Right Atrium: Right atrial size was not assessed.  Pericardium: The pericardium was not assessed.  Mitral Valve: The mitral valve was not assessed. Not assessed mitral valve regurgitation.  Tricuspid Valve: The tricuspid valve is not assessed. Tricuspid valve regurgitation not assessed.  Aortic Valve: The aortic valve was not assessed. Aortic valve regurgitation not assessed.  Pulmonic  Valve: The pulmonic valve was not assessed. Pulmonic valve regurgitation not assessed.  Aorta: Aortic root could not be assessed.  Shunts: The interatrial septum was not assessed.   LEFT VENTRICLE          Normals PLAX 2D LVIDd:         4.65 cm  3.6 cm   Diastology                 Normals LVIDs:         2.60 cm  1.7 cm   LV e' lateral:   8.59 cm/s 6.42 cm/s LV PW:         0.84 cm  1.4 cm   LV E/e' lateral: 11.8      15.4 LV IVS:        0.77 cm  1.3 cm   LV e' medial:    8.49 cm/s 6.96 cm/s LVOT diam:     1.90 cm  2.0 cm   LV E/e' medial:  11.9      6.96 LV SV:         75 ml    79 ml LV SV Index:   33.08    45 ml/m2 LVOT Area:     2.84 cm 3.14 cm2  LEFT ATRIUM         Index LA diam:    3.20 cm 1.49 cm/m  AORTA                 Normals Ao Root diam: 2.70 cm 31 mm  MITRAL VALVE              Normals MV Area (PHT): 3.91 cm              SHUNTS MV PHT:        56.26 msec 55 ms      Systemic Diam: 1.90 cm MV Decel Time: 194 msec   187 ms MV E velocity: 101.00 cm/s 103 cm/s MV A velocity: 66.00 cm/s  70.3 cm/s MV E/A ratio:  1.53        1.5   Dina Rich MD Electronically signed by Dina Rich MD Signature Date/Time: 05/23/2019/4:49:14 PM    Final   ECHOCARDIOGRAM  ECHOCARDIOGRAM COMPLETE 04/25/2023  Narrative ECHOCARDIOGRAM REPORT    Patient Name:   RAJEAN DESANTIAGO Date of Exam: 04/25/2023 Medical Rec #:  [id number]    Height:       66.5 in Accession #:    [id number]   Weight:       217.8 lb Date of Birth:  1965/12/26     BSA:          2.085 m Patient Age:    57 years     BP:           130/80 mmHg Patient Gender: F            HR:           80 bpm. [facility]  Procedure: 2D Echo (Both Spectral and Color Flow Doppler were utilized during procedure).  Indications:    Murmur, cardiac [R01.1 (ICD-10-CM)]; Primary hypertension [I10 (ICD-10-CM)]  History:        Patient has prior history of Echocardiogram examinations, most recent 05/23/2019.  Signs/Symptoms:Chest Pain and Shortness of Breath; Risk Factors:Hypertension, Diabetes and Former Smoker.  Sonographer:    Dondra Prader RVT RCS Referring Phys: [phone] Gayland Nicol D Danissa Rundle   Sonographer Comments: Suboptimal subcostal window. Image acquisition challenging due to patient body habitus. IMPRESSIONS   1. Left ventricular ejection fraction, by estimation, is 60 to 65%. The left ventricle has normal function. The left ventricle has no regional wall motion abnormalities. Left ventricular diastolic parameters were normal. 2. Right ventricular systolic function is normal. The right ventricular size is normal. 3. The mitral valve is normal in structure. No evidence of mitral valve regurgitation. No evidence of mitral stenosis. 4. The aortic valve is tricuspid. Aortic valve regurgitation is not visualized. No aortic stenosis is present. 5. The inferior vena cava is normal in size with greater than 50% respiratory variability, suggesting right atrial pressure of 3 mmHg.  FINDINGS Left Ventricle: Left ventricular ejection fraction, by estimation, is 60 to 65%. The left ventricle has normal function. The left ventricle has no regional wall motion abnormalities. Strain imaging was not performed. The left ventricular internal cavity size was normal in size. There is no left ventricular hypertrophy. Left ventricular diastolic parameters were normal.  Right Ventricle: The right ventricular size is normal. Right vetricular wall thickness was not well visualized. Right ventricular systolic function is normal.  Left Atrium: Left atrial size was normal in size.  Right Atrium: Right atrial size was not well visualized.  Pericardium: There is no evidence of pericardial effusion.  Mitral Valve: The mitral valve is normal in structure. No evidence of mitral valve regurgitation. No evidence of mitral valve stenosis.  Tricuspid Valve: The tricuspid valve is normal in structure. Tricuspid valve  regurgitation is not demonstrated. No evidence of tricuspid stenosis.  Aortic Valve: The aortic valve is tricuspid. Aortic valve regurgitation is not visualized. No aortic stenosis is present. Aortic valve mean gradient measures 4.0 mmHg. Aortic valve peak gradient measures 7.5 mmHg. Aortic valve area, by VTI measures 2.37 cm.  Pulmonic Valve: The pulmonic valve was not well visualized. Pulmonic valve regurgitation is not visualized. No evidence of pulmonic stenosis.  Aorta: The aortic root and ascending aorta are structurally normal, with no evidence of dilitation.  Venous: The inferior vena cava is normal in size with greater than 50% respiratory variability, suggesting right atrial pressure of 3 mmHg.  IAS/Shunts: The interatrial septum was not well visualized.  Additional Comments: 3D imaging was not performed.   LEFT VENTRICLE PLAX 2D LVIDd:         4.30 cm   Diastology LVIDs:         2.70 cm   LV e' medial:    8.38 cm/s LV PW:         0.90 cm   LV E/e' medial:  9.6 LV IVS:        0.70 cm   LV e' lateral:   8.49 cm/s LVOT diam:     1.80 cm   LV E/e' lateral: 9.5 LV SV:         52 LV SV Index:   25 LVOT Area:     2.54 cm   RIGHT VENTRICLE             IVC RV Basal diam:  2.50 cm     IVC diam: 1.50 cm RV S prime:     11.20 cm/s TAPSE (M-mode): 1.9 cm  LEFT ATRIUM             Index       RIGHT ATRIUM          Index LA diam:        2.30 cm 1.10 cm/m  RA Area:     8.14 cm LA Vol (A2C):   14.6 ml 7.00 ml/m  RA Volume:   15.30 ml 7.34 ml/m LA Vol (A4C):   13.1 ml 6.28 ml/m LA Biplane Vol: 14.9 ml 7.15 ml/m AORTIC VALVE                    PULMONIC VALVE AV Area (Vmax):    2.17 cm     PV Vmax:       0.88 m/s AV Area (Vmean):   2.07 cm     PV Peak grad:  3.1 mmHg AV Area (VTI):     2.37 cm AV Vmax:           137.00 cm/s AV Vmean:          94.700 cm/s AV VTI:            0.219 m AV Peak Grad:      7.5 mmHg AV Mean Grad:      4.0 mmHg LVOT Vmax:         117.00  cm/s LVOT Vmean:        77.100 cm/s LVOT VTI:          0.204 m LVOT/AV VTI ratio: 0.93  AORTA Ao Root diam: 2.90 cm Ao Asc diam:  2.90  cm  MITRAL VALVE MV Area (PHT): 3.60 cm    SHUNTS MV Decel Time: 211 msec    Systemic VTI:  0.20 m MV E velocity: 80.50 cm/s  Systemic Diam: 1.80 cm MV A velocity: 74.70 cm/s MV E/A ratio:  1.08  Dina Rich MD Electronically signed by Dina Rich MD Signature Date/Time: 04/25/2023/10:55:38 AM    Final    MONITORS  CARDIAC EVENT MONITOR 05/03/2015  Narrative Sinus rhythm  No arrhythmias noted       ______________________________________________________________________________________________           Recent Labs: 01/16/2023: BUN 7; Creat 0.61; Potassium 3.6; Sodium 143 03/02/2023: ALT 27; Hemoglobin 14.0; Platelets 318  Recent Lipid Panel    Component Value Date/Time   CHOL 207 (H) 01/16/2023 0845   TRIG 123 01/16/2023 0845   HDL 59 01/16/2023 0845   CHOLHDL 3.5 01/16/2023 0845   VLDL 26 05/28/2019 0955   LDLCALC 125 (H) 01/16/2023 0845   Physical Exam:    VS:  BP 120/88   Pulse 78   Ht 5' 6.5" (1.689 m)   Wt 216 lb (98 kg)   SpO2 98%   BMI 34.34 kg/m     Wt Readings from Last 3 Encounters:  05/02/23 216 lb (98 kg)  04/09/23 217 lb 12.8 oz (98.8 kg)  03/08/23 221 lb (100.2 kg)    General: Well developed, well nourished, NAD Lungs:Clear to ausculation bilaterally. No wheezes, rales, or rhonchi. Breathing is unlabored. Cardiovascular: RRR with S1 S2. No murmurs Extremities: No edema.  Neuro: Alert and oriented. No focal deficits. No facial asymmetry. MAE spontaneously. Psych: Responds to questions appropriately with normal affect.    ASSESSMENT/PLAN:    HTN: Previously uncontrolled however losartan and metoprolol added to her regimen with great improvement today at 120/88. Continue current regimen with amlodipine, hydrochlorothiazide, losartan, and metoprolol.    Chest pain: Continues to have mild  chest pressure despite better BP control therefore will obtain coronary CTA for full cardiac assessment. Echocardiogram performed after prior appointment with normal LVEF and no valvular disease. Plan follow up with Dr. Tenny Craw after testing.      Medication Adjustments/Labs and Tests Ordered: Current medicines are reviewed at length with the patient today.  Concerns regarding medicines are outlined above.  Orders Placed This Encounter  Procedures   CT CORONARY MORPH W/CTA COR W/SCORE W/CA W/CM &/OR WO/CM   Basic Metabolic Panel (BMET)   No orders of the defined types were placed in this encounter.   Patient Instructions  Medication Instructions:  Your physician recommends that you continue on your current medications as directed. Please refer to the Current Medication list given to you today.  *If you need a refill on your cardiac medications before your next appointment, please call your pharmacy*   Lab Work: TODAY:  BMET  If you have labs (blood work) drawn today and your tests are completely normal, you will receive your results only by: MyChart Message (if you have MyChart) OR A paper copy in the mail If you have any lab test that is abnormal or we need to change your treatment, we will call you to review the results.   Testing/Procedures: Your physician has requested that you have cardiac CT. Cardiac computed tomography (CT) is a painless test that uses an x-ray machine to take clear, detailed pictures of your heart. For further information please visit [email or web address] Please follow instruction sheet  BELOW:    Your cardiac CT will be scheduled at one of the  below locations:   [facility] [address] [place], [place] [address] [phone]  OR  [facility] [address] [place], [place] [address] [phone]  OR   [facility] [address] [place], [place]  [address] [phone]  OR   [facility] [address] [place], [place] [address] [phone]  If scheduled at [facility], please arrive at the [facility] Entrance (Entrance C2) of [facility] 30 minutes prior to test start time. You can use the FREE valet parking offered at entrance C (encouraged to control the heart rate for the test)  Proceed to the [facility] Radiology Department (first floor) to check-in and test prep.  All radiology patients and guests should use entrance C2 at [facility], accessed from [facility], even though the hospital's physical address listed is [address].    If scheduled at [facility] or [facility], please arrive 15 mins early for check-in and test prep.  There is spacious parking and easy access to the radiology department from the [facility] entrance. Please enter here and check-in with the desk attendant.   If scheduled at [facility], please arrive 30 minutes early for check-in and test prep.  Please follow these instructions carefully (unless otherwise directed):  An IV will be required for this test and Nitroglycerin will be given.  Hold all erectile dysfunction medications at least 3 days (72 hrs) prior to test. (Ie viagra, cialis, sildenafil, tadalafil, etc)   On the Night Before the Test: Be sure to Drink plenty of water. Do not consume any caffeinated/decaffeinated beverages or chocolate 12 hours prior to your test. Do not take any antihistamines 12 hours prior to your test.   On the Day of the Test: Drink plenty of water until 1 hour prior to the test. Do not eat any food 1 hour prior to test. You may take your regular medications prior to the test.  Take metoprolol (Lopressor) 100 MG (4 OF YOUR 25 MG TABLETS) two hours prior to test. THIS WILL BE ALL THE LOPRESSOR YOU TAKE THAT DAY If you  take Furosemide/Hydrochlorothiazide/Spironolactone/Chlorthalidone, please HOLD on the morning of the test. Patients who wear a continuous glucose monitor MUST remove the device prior to scanning. FEMALES- please wear underwire-free bra if available, avoid dresses & tight clothing  After the Test: Drink plenty of water. After receiving IV contrast, you may experience a mild flushed feeling. This is normal. On occasion, you may experience a mild rash up to 24 hours after the test. This is not dangerous. If this occurs, you can take Benadryl 25 mg, Zyrtec, Claritin, or Allegra and increase your fluid intake. (Patients taking Tikosyn should avoid Benadryl, and may take Zyrtec, Claritin, or Allegra) If you experience trouble breathing, this can be serious. If it is severe call 911 IMMEDIATELY. If it is mild, please call our office.  We will call to schedule your test 2-4 weeks out understanding that some insurance companies will need an authorization prior to the service being performed.   For more information and frequently asked questions, please visit our website : [email or web address]  For non-scheduling related questions, please contact the cardiac imaging nurse navigator should you have any questions/concerns: Cardiac Imaging Nurse Navigators Direct Office Dial: [phone]   For scheduling needs, including cancellations and rescheduling, please call [place], [phone].  Follow-Up: At [facility], you and your health needs are our priority.  As part of our continuing mission to provide you with exceptional heart care, we have created designated Provider Care Teams.  These Care Teams include your primary Cardiologist (physician) and Advanced Practice Providers (APPs -  Physician Assistants and Nurse Practitioners) who all work together to provide you with the care you need, when you need it.  We recommend signing up for the patient portal called "MyChart".   Sign up information is provided on this After Visit Summary.  MyChart is used to connect with patients for Virtual Visits (Telemedicine).  Patients are able to view lab/test results, encounter notes, upcoming appointments, etc.  Non-urgent messages can be sent to your provider as well.   To learn more about what you can do with MyChart, go to [email or web address].    Your next appointment:   As scheduled   Provider:   Dietrich Pates, MD    Other Instructions        Signed, Georgie Chard, NP  05/02/2023 12:11 PM    [facility]

## 2023-05-03 ENCOUNTER — Other Ambulatory Visit: Payer: Self-pay | Admitting: Family Medicine

## 2023-05-03 ENCOUNTER — Other Ambulatory Visit: Payer: Self-pay | Admitting: Internal Medicine

## 2023-05-03 DIAGNOSIS — Z1231 Encounter for screening mammogram for malignant neoplasm of breast: Secondary | ICD-10-CM

## 2023-05-03 LAB — BASIC METABOLIC PANEL
BUN/Creatinine Ratio: 13 (ref 9–23)
BUN: 9 mg/dL (ref 6–24)
CO2: 26 mmol/L (ref 20–29)
Calcium: 9.1 mg/dL (ref 8.7–10.2)
Chloride: 99 mmol/L (ref 96–106)
Creatinine, Ser: 0.7 mg/dL (ref 0.57–1.00)
Glucose: 140 mg/dL — ABNORMAL HIGH (ref 70–99)
Potassium: 3.5 mmol/L (ref 3.5–5.2)
Sodium: 141 mmol/L (ref 134–144)
eGFR: 102 mL/min/{1.73_m2} (ref >59)

## 2023-05-03 MED ORDER — AMLODIPINE BESYLATE 10 MG PO TABS: 10.0000 mg | ORAL_TABLET | Freq: Every day | ORAL | 3 refills | Status: DC

## 2023-05-07 ENCOUNTER — Ambulatory Visit
Admission: RE | Admit: 2023-05-07 | Discharge: 2023-05-07 | Disposition: A | Discharge status: Home / Self Care | Payer: Medicare Other | Source: Ambulatory Visit | Attending: Family Medicine | Admitting: Family Medicine

## 2023-05-07 DIAGNOSIS — Z1231 Encounter for screening mammogram for malignant neoplasm of breast: Secondary | ICD-10-CM

## 2023-05-07 MED ORDER — AMLODIPINE BESYLATE 10 MG PO TABS: 10.0000 mg | ORAL_TABLET | Freq: Every day | ORAL | 3 refills | Status: AC

## 2023-05-07 NOTE — Addendum Note (Signed)
 Addended by: Bertram Millard on: 05/07/2023 12:03 PM   Modules accepted: Orders

## 2023-05-07 NOTE — Telephone Encounter (Signed)
 OK to fill amlodipine 10 mg if that is what she has been taking

## 2023-05-14 ENCOUNTER — Encounter: Payer: Self-pay | Admitting: Internal Medicine

## 2023-05-14 ENCOUNTER — Encounter (INDEPENDENT_AMBULATORY_CARE_PROVIDER_SITE_OTHER): Payer: Self-pay | Admitting: Family Medicine

## 2023-05-14 ENCOUNTER — Ambulatory Visit (INDEPENDENT_AMBULATORY_CARE_PROVIDER_SITE_OTHER): Payer: Medicare Other | Admitting: Family Medicine

## 2023-05-14 VITALS — BP 100/64 | HR 90 | Temp 97.9°F | Ht 66.0 in | Wt 217.0 lb

## 2023-05-14 DIAGNOSIS — R0602 Shortness of breath: Secondary | ICD-10-CM

## 2023-05-14 DIAGNOSIS — E66811 Obesity, class 1: Secondary | ICD-10-CM

## 2023-05-14 DIAGNOSIS — Z7985 Long-term (current) use of injectable non-insulin antidiabetic drugs: Secondary | ICD-10-CM

## 2023-05-14 DIAGNOSIS — G4733 Obstructive sleep apnea (adult) (pediatric): Secondary | ICD-10-CM | POA: Diagnosis not present

## 2023-05-14 DIAGNOSIS — F3181 Bipolar II disorder: Secondary | ICD-10-CM

## 2023-05-14 DIAGNOSIS — I152 Hypertension secondary to endocrine disorders: Secondary | ICD-10-CM

## 2023-05-14 DIAGNOSIS — E1169 Type 2 diabetes mellitus with other specified complication: Secondary | ICD-10-CM | POA: Diagnosis not present

## 2023-05-14 DIAGNOSIS — E1159 Type 2 diabetes mellitus with other circulatory complications: Secondary | ICD-10-CM | POA: Diagnosis not present

## 2023-05-14 DIAGNOSIS — F5089 Other specified eating disorder: Secondary | ICD-10-CM

## 2023-05-14 DIAGNOSIS — E785 Hyperlipidemia, unspecified: Secondary | ICD-10-CM

## 2023-05-14 DIAGNOSIS — Z1331 Encounter for screening for depression: Secondary | ICD-10-CM

## 2023-05-14 DIAGNOSIS — Z6833 Body mass index (BMI) 33.0-33.9, adult: Secondary | ICD-10-CM

## 2023-05-14 DIAGNOSIS — R5383 Other fatigue: Secondary | ICD-10-CM | POA: Diagnosis not present

## 2023-05-14 NOTE — Progress Notes (Signed)
 [name], D.O.  ABFM, ABOM Specializing in Clinical Bariatric Medicine Office located at: [address]  [place], [place]  [address]   Bariatric Medicine Visit  Dear [name], [name] Heidelberg, MD   Thank you for referring [name] to our clinic today for evaluation.  We performed a consultation to discuss her options for treatment and educate the patient on her disease state.  The following note includes my evaluation and treatment recommendations.   Please do not hesitate to reach out to me directly if you have any further concerns.   Assessment and Plan:  Pt had info session in 2023, has not been seen since. FOR THE DISEASE OF OBESITY: Class 1 obesity with serious comorbidity and body mass index (BMI) of 33.0 to 33.9 in adult, unspecified obesity type - BMI: 33.57 Assessment & Plan: Recommended Dietary Goals [name] is currently in the action stage of change. As such, her goal is to start our weight management plan.  She has agreed to: Start CAT 2 MP   Behavioral Intervention We discussed the following Behavioral Modification Strategies today: increasing lean protein intake to established goals, avoiding skipping meals, and decreasing eating out or consumption of processed foods, and making healthy choices when eating convenient foods  Additional resources provided today: handout on CAT 2 meal plan, Handout on recipe book #1 (slow cooker recipes)  Evidence-based interventions for health behavior change were utilized today including the discussion of self monitoring techniques, problem-solving barriers and SMART goal setting techniques.    Pt will specifically work on: n/a   Recommended Physical Activity Goals [name] has been advised to work up to 150 minutes of moderate intensity aerobic activity a week and strengthening exercises 2-3 times per week for cardiovascular health, weight loss maintenance and preservation of muscle mass.   She has agreed to : maintain current  level of activity.    Pharmacotherapy We discussed various medication options to help [name] with her weight loss efforts and we both agreed to :  Continue current medication regimen   FOR ASSOCIATED CONDITIONS ADDRESSED TODAY:  Fatigue Assessment & Plan: [name] does feel that her weight is causing her energy to be lower than it should be. Fatigue may be related to obesity, depression or many other causes. she does not appear to have any red flag symptoms and this appears to most likely be related to her current lifestyle habits and dietary intake.  Labs will be ordered and reviewed with her at their next office visit in two weeks.  Relevant Orders: -     EKG 12-Lead   Epworth sleepiness scale is 10 and appears to not be within normal limits. [name] reports daytime somnolence and reports waking up still tired. Patient has a history of symptoms of daytime fatigue, morning fatigue, Epworth sleepiness scale, and morning headache. [name] generally gets 7 or 8 hours of sleep per night, and states that she has generally unrestful sleep. Snoring is present. Apneic episodes are present.    ECG: Performed and reviewed/ interpreted independently.  Normal sinus rhythm, rate 74 bpm; reassuring without any acute abnormalities, will continue to monitor for symptoms    Shortness of breath on exertion Assessment & Plan: [name] does feel that she gets out of breath more easily than she used to when she exercises and seems to be worsening over time with weight gain.  This has gotten worse recently. [name] denies shortness of breath at rest or orthopnea. [name]'s shortness of breath appears to be  obesity related and exercise induced, as they do not appear to have any "red flag" symptoms/ concerns today.  Also, this condition appears to be related to a state of poor cardiovascular conditioning   Obtain labs today and will be reviewed with her at their next office visit in two weeks.   Indirect Calorimeter completed  today to help guide our dietary regimen. It shows a VO2 of 265 and a REE of 1829.  Her calculated basal metabolic rate is 4742 thus her resting energy expenditure is better than expected.  Patient agreed to work on weight loss at this time.  As [name] progresses through our weight loss program, we will gradually increase exercise as tolerated to treat her current condition.   If [name] follows our recommendations and loses 5-10% of their weight without improvement of her shortness of breath or if at any time, symptoms become more concerning, they agree to urgently follow up with their PCP/ specialist for further consideration/ evaluation.   [name] verbalizes agreement with this plan.    Depression Screen  Assessment & Plan: Her Food and Mood (modified PHQ-9) score was 21. Pt denies SI/HI. Mood currently well controlled. No concerns today in this regard.   Type 2 diabetes mellitus with obesity [name]) Assessment & Plan: [name] was prescribed Rybelsus 3 mg daily in 01/2023, but has not been taking d/t confusion of when to take pill. She was diagnosed with T2DM in 2021. On 01/16/2023, pt's A1c was 6.6. In the past, [name] was on Metformin in 2021, as well as Januvia for several years but insurance stopped coverage Nov 2024. F/up with PCP about potential medication changes. Will continue to monitor as it pertains to her weight loss journey.   Relevant Orders: -     Folate -     Insulin, random -     CBC with Differential/Platelet -     Comprehensive metabolic panel -     Hemoglobin A1c -     Lipid panel -     T4, free -     TSH -     Vitamin B12 -     VITAMIN D 25 Hydroxy (Vit-D Deficiency, Fractures)   OSA (obstructive sleep apnea) Assessment & Plan: [name] endorses being "borderline" at diagnosis so was not given a CPAP machine but we could not find documentation of sleep study. Encouraged pt to f/up with PCP, neurologist, or cardiologist to discuss getting another sleep study because sleep scale is  elevated indicating OSA and its been 5 years. Will continue to monitor alongside specialists.   Hyperlipidemia associated with type 2 diabetes mellitus (HCC) Assessment & Plan: HLD is treated with Crestor 10 mg daily. [name] agrees to continue with meds and start our treatment plan of a heart-heathy, low cholesterol meal plan. We will continue routine screening as patient continues to achieve health goals along their weight loss journey   Hypertension associated with diabetes [facility]) Assessment & Plan: Relevant medications: Norvasc 10 mg daily, Hydrochlorothiazide 25 mg daily, Losartan 50 mg daily, and Lopressor 25 mg twice daily. [name] reports being diagnosed 10 years ago. Condition monitored by cardiologist, Dr. Tenny Craw. Notes reviewed from first visit with Dr. Tenny Craw on 05/2014. BP well controlled, stable today at 100/64. [name] reports BP levels averaging 100s/70s at home. Pt reports going to ER for chest pain with stress in 2022. Echocardiogram was obtained 04/25/2023, EF was 60-65 and testing was grossly. Follow low salt, heart healthy meal plan and engage in consistent exercise. We  will continue to monitor closely alongside cardiologist.   Bipolar II disorder (HCC) - emotional eating Assessment & Plan: [name] is on Effexor XR 300 mg daily. Pt denies any SI/HI. Mood currently well contolled. Ciel admits to eating when bored, stressed, and sad. Encouraged pt to continue seeing counselor every 2 weeks. Will consider referral to Dr. Dewaine Conger in the future. Continue current medication regimen. Will continue to monitor condition closely.    FOLLOW UP:   Follow up in 2 weeks. She was informed of the importance of frequent follow up visits to maximize her success with intensive lifestyle modifications for her multiple health conditions.  [name] is aware that we will review all of her lab results at our next visit.  She is aware that if anything is critical/ life threatening with the results, we  will be contacting her via MyChart prior to the office visit to discuss management.    Chief Complaint:   OBESITY [name] (MR# [id number]) is a pleasant 58 y.o. female who presents for evaluation and treatment of obesity and related comorbidities. Current BMI is Body mass index is 35.02 kg/m. [name] has been struggling with her weight for many years and has been unsuccessful in either losing weight, maintaining weight loss, or reaching her healthy weight goal.  [name] is currently in the action stage of change and ready to dedicate time achieving and maintaining a healthier weight. [name] is interested in becoming our patient and working on intensive lifestyle modifications including (but not limited to) diet and exercise for weight loss.  IONNA AVIS is married to [place]  and has 2 adult children. She lives with her husband.  Does not exercise  Desires to be 160 lbs in 6 months  Has not been on any diets, was only on medications for diabetes that helped lose weight  Eats out 2-3 nights a week  Has not cooked since 2017 -- since she has seizures (Husband does most of cooking -- a lot of fried foods)  Craves: sweets  Snacks: Popcorn, ice cream, cookies, cake  Snacks mid afternoon and right before bed  Skips lunch most days  Drinks sweet tea, regular soda (when eating out), and mixed drinks socially (rarely)  WFH: skipping meals and snacking before bed Subjective:   This is the patient's first visit at [facility].  The patient's NEW PATIENT PACKET that they filled out prior to today's office visit was reviewed at length and information from that paperwork was included within the following office visit note.    Included in the packet: current and past health history, medications, allergies, ROS, gynecologic history (women only), surgical history, family history, social history, weight history, weight loss surgery history (for those that have  had weight loss surgery), nutritional evaluation, mood and food questionnaire along with a depression screening (PHQ9) on all patients, an Epworth questionnaire, sleep habits questionnaire, patient life and health improvement goals questionnaire. These will all be scanned into the patient's chart under the "media" tab.   Review of Systems: Please refer to new patient packet scanned into media. Pertinent positives were addressed with patient today.  Reviewed by clinician on day of visit: allergies, medications, problem list, medical history, surgical history, family history, social history, and previous encounter notes.  During the visit, I independently reviewed the patient's EKG, bioimpedance scale results, and indirect calorimeter results. I used this information to tailor a meal plan for the patient that will  help [name] to lose weight and will improve her obesity-related conditions going forward.  I performed a medically necessary appropriate examination and/or evaluation. I discussed the assessment and treatment plan with the patient. The patient was provided an opportunity to ask questions and all were answered. The patient agreed with the plan and demonstrated an understanding of the instructions. Labs were ordered today (unless patient declined them) and will be reviewed with the patient at our next visit unless more critical results need to be addressed immediately. Clinical information was updated and documented in the EMR.    Objective:   PHYSICAL EXAM: Blood pressure 100/64, pulse 90, temperature 97.9 F (36.6 C), height 5\' 6"  (1.676 m), weight 217 lb (98.4 kg), SpO2 98%. Body mass index is 35.02 kg/m.  General: Well Developed, well nourished, and in no acute distress.  HEENT: Normocephalic, atraumatic; EOMI, sclerae are anicteric. Skin: Warm and dry, good turgor Chest:  Normal excursion, shape, no gross ABN Respiratory: No conversational dyspnea; speaking in full  sentences NeuroM-Sk:  Normal gross ROM * 4 extremities  Psych: A and O *3, insight adequate, mood- full   Anthropometric Measurements Height: 5\' 6"  (1.676 m) Weight: 217 lb (98.4 kg) BMI (Calculated): 35.04 Weight at Last Visit: N/A Weight Lost Since Last Visit: N/A Weight Gained Since Last Visit: N/A Starting Weight: 217 lb Peak Weight: 232 lb Waist Measurement : 47.5 inches   Body Composition  Body Fat %: 46.5 % Fat Mass (lbs): 101 lbs Muscle Mass (lbs): 110.4 lbs Total Body Water (lbs): 77.4 lbs Visceral Fat Rating : 13   Other Clinical Data RMR: 1829 Fasting: Yes Labs: Yes Today's Visit #: 1 Starting Date: 05/14/23 Comments: First Visit    DIAGNOSTIC DATA REVIEWED:  BMET    Component Value Date/Time   NA 141 05/02/2023 1151   K 3.5 05/02/2023 1151   CL 99 05/02/2023 1151   CO2 26 05/02/2023 1151   GLUCOSE 140 (H) 05/02/2023 1151   GLUCOSE 137 (H) 01/16/2023 0845   BUN 9 05/02/2023 1151   CREATININE 0.70 05/02/2023 1151   CREATININE 0.61 01/16/2023 0845   CALCIUM 9.1 05/02/2023 1151   GFRNONAA >60 07/27/2022 1400   GFRNONAA 106 02/16/2020 1144   GFRAA 123 02/16/2020 1144   Lab Results  Component Value Date   HGBA1C 6.6 (H) 01/16/2023   HGBA1C 5.2 05/19/2013   No results found for: "INSULIN" Lab Results  Component Value Date   TSH 1.12 04/18/2022   CBC    Component Value Date/Time   WBC 6.7 03/02/2023 0943   RBC 4.77 03/02/2023 0943   HGB 14.0 03/02/2023 0943   HGB 14.4 11/09/2022 1740   HCT 42.1 03/02/2023 0943   HCT 43.2 11/09/2022 1740   PLT 318 03/02/2023 0943   PLT 350 11/09/2022 1740   MCV 88.3 03/02/2023 0943   MCV 86 11/09/2022 1740   MCH 29.4 03/02/2023 0943   MCHC 33.3 03/02/2023 0943   RDW 13.3 03/02/2023 0943   RDW 13.0 11/09/2022 1740   Iron Studies No results found for: "IRON", "TIBC", "FERRITIN", "IRONPCTSAT" Lipid Panel     Component Value Date/Time   CHOL 207 (H) 01/16/2023 0845   TRIG 123 01/16/2023 0845   HDL  59 01/16/2023 0845   CHOLHDL 3.5 01/16/2023 0845   VLDL 26 05/28/2019 0955   LDLCALC 125 (H) 01/16/2023 0845   Hepatic Function Panel     Component Value Date/Time   PROT 7.0 03/02/2023 0943   ALBUMIN 3.3 (L)  07/27/2022 1400   AST 19 03/02/2023 0943   ALT 27 03/02/2023 0943   ALKPHOS 127 (H) 07/27/2022 1400   BILITOT 0.4 03/02/2023 0943   BILIDIR 0.1 03/02/2023 0943   IBILI 0.3 03/02/2023 0943      Component Value Date/Time   TSH 1.12 04/18/2022 0909   Nutritional No results found for: "VD25OH"  Attestation Statements:   I, Camryn Mix, acting as a Stage manager for [employer], DO., have compiled all relevant documentation for today's office visit on behalf of Thomasene Lot, DO, while in the presence of [employer], DO.  Reviewed by clinician on day of visit: allergies, medications, problem list, medical history, surgical history, family history, social history, and previous encounter notes pertinent to patient's obesity diagnosis. I have spent 67 minutes in the care of the patient today including: preparing to see patient (e.g. review and interpretation of tests, old notes ), obtaining and/or reviewing separately obtained history, performing a medically appropriate examination or evaluation, counseling and educating the patient, ordering medications, test or procedures, documenting clinical information in the electronic or other health care record, and independently interpreting results and communicating results to the patient, family, or caregiver   I have reviewed the above documentation for accuracy and completeness, and I agree with the above. [name], D.O.  The 21st Century Cures Act was signed into law in 2016 which includes the topic of electronic health records.  This provides immediate access to information in MyChart.  This includes consultation notes, operative notes, office notes, lab results and pathology reports.  If you have any questions about  what you read please let us know at your next visit so we can discuss your concerns and take corrective action if need be.  We are right here with you.

## 2023-05-14 NOTE — Telephone Encounter (Signed)
 Can she get Itamar study

## 2023-05-15 ENCOUNTER — Telehealth (HOSPITAL_COMMUNITY): Payer: Self-pay | Admitting: *Deleted

## 2023-05-15 NOTE — Telephone Encounter (Signed)
 Reaching out to patient to offer assistance regarding upcoming cardiac imaging study; pt verbalizes understanding of appt date/time, parking situation and where to check in, pre-test NPO status and medications ordered, and verified current allergies; name and call back number provided for further questions should they arise [name] Frame RN Navigator Cardiac Imaging [facility] [phone] office [phone] cell

## 2023-05-16 ENCOUNTER — Other Ambulatory Visit: Payer: Self-pay

## 2023-05-16 ENCOUNTER — Ambulatory Visit (HOSPITAL_COMMUNITY)
Admission: RE | Admit: 2023-05-16 | Discharge: 2023-05-16 | Disposition: A | Discharge status: Home / Self Care | Payer: Medicare Other | Source: Ambulatory Visit | Attending: Cardiology | Admitting: Cardiology

## 2023-05-16 DIAGNOSIS — I2510 Atherosclerotic heart disease of native coronary artery without angina pectoris: Secondary | ICD-10-CM

## 2023-05-16 DIAGNOSIS — R079 Chest pain, unspecified: Secondary | ICD-10-CM | POA: Diagnosis present

## 2023-05-16 DIAGNOSIS — I2089 Other forms of angina pectoris: Secondary | ICD-10-CM | POA: Insufficient documentation

## 2023-05-16 DIAGNOSIS — R011 Cardiac murmur, unspecified: Secondary | ICD-10-CM | POA: Diagnosis present

## 2023-05-16 DIAGNOSIS — I10 Essential (primary) hypertension: Secondary | ICD-10-CM | POA: Insufficient documentation

## 2023-05-16 DIAGNOSIS — Z8249 Family history of ischemic heart disease and other diseases of the circulatory system: Secondary | ICD-10-CM | POA: Diagnosis not present

## 2023-05-16 DIAGNOSIS — R0683 Snoring: Secondary | ICD-10-CM

## 2023-05-16 LAB — COMPREHENSIVE METABOLIC PANEL
ALT: 40 IU/L — ABNORMAL HIGH (ref 0–32)
AST: 36 IU/L (ref 0–40)
Albumin: 4.3 g/dL (ref 3.8–4.9)
Alkaline Phosphatase: 198 IU/L — ABNORMAL HIGH (ref 44–121)
BUN/Creatinine Ratio: 13 (ref 9–23)
BUN: 9 mg/dL (ref 6–24)
Bilirubin Total: 0.5 mg/dL (ref 0.0–1.2)
CO2: 20 mmol/L (ref 20–29)
Calcium: 9.2 mg/dL (ref 8.7–10.2)
Chloride: 103 mmol/L (ref 96–106)
Creatinine, Ser: 0.7 mg/dL (ref 0.57–1.00)
Globulin, Total: 2.4 g/dL (ref 1.5–4.5)
Glucose: 118 mg/dL — ABNORMAL HIGH (ref 70–99)
Potassium: 4.3 mmol/L (ref 3.5–5.2)
Sodium: 139 mmol/L (ref 134–144)
Total Protein: 6.7 g/dL (ref 6.0–8.5)
eGFR: 102 mL/min/{1.73_m2} (ref >59)

## 2023-05-16 LAB — CBC WITH DIFFERENTIAL/PLATELET
Basophils Absolute: 0 10*3/uL (ref 0.0–0.2)
Basos: 1 %
EOS (ABSOLUTE): 0.2 10*3/uL (ref 0.0–0.4)
Eos: 2 %
Hematocrit: 38.7 % (ref 34.0–46.6)
Hemoglobin: 13.1 g/dL (ref 11.1–15.9)
Immature Grans (Abs): 0 10*3/uL (ref 0.0–0.1)
Immature Granulocytes: 0 %
Lymphocytes Absolute: 2 10*3/uL (ref 0.7–3.1)
Lymphs: 29 %
MCH: 29.6 pg (ref 26.6–33.0)
MCHC: 33.9 g/dL (ref 31.5–35.7)
MCV: 87 fL (ref 79–97)
Monocytes Absolute: 0.3 10*3/uL (ref 0.1–0.9)
Monocytes: 5 %
Neutrophils Absolute: 4.5 10*3/uL (ref 1.4–7.0)
Neutrophils: 63 %
Platelets: 321 10*3/uL (ref 150–450)
RBC: 4.43 x10E6/uL (ref 3.77–5.28)
RDW: 12.3 % (ref 11.7–15.4)
WBC: 7.1 10*3/uL (ref 3.4–10.8)

## 2023-05-16 LAB — LIPID PANEL
Chol/HDL Ratio: 3.6 ratio (ref 0.0–4.4)
Cholesterol, Total: 212 mg/dL — ABNORMAL HIGH (ref 100–199)
HDL: 59 mg/dL (ref >39)
LDL Chol Calc (NIH): 129 mg/dL — ABNORMAL HIGH (ref 0–99)
Triglycerides: 134 mg/dL (ref 0–149)
VLDL Cholesterol Cal: 24 mg/dL (ref 5–40)

## 2023-05-16 LAB — FOLATE: Folate: 4.1 ng/mL (ref >3.0)

## 2023-05-16 LAB — VITAMIN B12: Vitamin B-12: 461 pg/mL (ref 232–1245)

## 2023-05-16 LAB — HEMOGLOBIN A1C
Est. average glucose Bld gHb Est-mCnc: 146 mg/dL
Hgb A1c MFr Bld: 6.7 % — ABNORMAL HIGH (ref 4.8–5.6)

## 2023-05-16 LAB — INSULIN, RANDOM: INSULIN: 17.8 u[IU]/mL (ref 2.6–24.9)

## 2023-05-16 LAB — VITAMIN D 25 HYDROXY (VIT D DEFICIENCY, FRACTURES): Vit D, 25-Hydroxy: 13 ng/mL — ABNORMAL LOW (ref 30.0–100.0)

## 2023-05-16 LAB — T4, FREE: Free T4: 1.01 ng/dL (ref 0.82–1.77)

## 2023-05-16 LAB — TSH: TSH: 1.89 u[IU]/mL (ref 0.450–4.500)

## 2023-05-16 MED ORDER — METOPROLOL TARTRATE 5 MG/5ML IV SOLN
10.0000 mg | INTRAVENOUS | Status: DC | PRN
  Administered 2023-05-16: 5 mg via INTRAVENOUS

## 2023-05-16 MED ORDER — NITROGLYCERIN 0.4 MG SL SUBL
SUBLINGUAL_TABLET | SUBLINGUAL | Status: AC
Start: 2023-05-16 — End: ?
  Filled 2023-05-16: qty 2

## 2023-05-16 MED ORDER — IOHEXOL 350 MG/ML SOLN: 95.0000 mL | Freq: Once | INTRAVENOUS | Status: DC | PRN

## 2023-05-16 MED ORDER — METOPROLOL TARTRATE 5 MG/5ML IV SOLN
INTRAVENOUS | Status: AC
  Filled 2023-05-16: qty 10

## 2023-05-16 MED ORDER — NITROGLYCERIN 0.4 MG SL SUBL
0.8000 mg | SUBLINGUAL_TABLET | Freq: Once | SUBLINGUAL | Status: AC
  Administered 2023-05-16: 0.8 mg via SUBLINGUAL

## 2023-05-16 MED ORDER — IOHEXOL 350 MG/ML SOLN
95.0000 mL | Freq: Once | INTRAVENOUS | Status: AC | PRN
  Administered 2023-05-16: 95 mL via INTRAVENOUS

## 2023-05-16 NOTE — Progress Notes (Signed)
it

## 2023-05-16 NOTE — Telephone Encounter (Signed)
Forwarding to you :)

## 2023-05-16 NOTE — Progress Notes (Signed)
Pt verbalized understanding of discharge instruction; opportunity for questions provided ?

## 2023-05-17 ENCOUNTER — Telehealth: Payer: Self-pay | Admitting: *Deleted

## 2023-05-17 NOTE — Telephone Encounter (Signed)
 S/w the pt and she will come by the office 05/22/23 to set up Itamar study.

## 2023-05-17 NOTE — Telephone Encounter (Signed)
-----   Message from Nurse Dewayne Hatch B sent at 05/16/2023  1:53 PM EDT ----- Regarding: Itamar Hi! Dr Tenny Craw ordered an Donnie Coffin on this lady outside of an office visit.Marland KitchenMarland Kitchen I will place the order and get the Stop Bang number.   Thank you soooo much!! Ann B.

## 2023-05-18 ENCOUNTER — Telehealth: Payer: Self-pay

## 2023-05-18 ENCOUNTER — Encounter: Payer: Self-pay | Admitting: *Deleted

## 2023-05-18 NOTE — Telephone Encounter (Signed)
 Patient Name:         DOB:       Height:     Weight:  Office Name:         Referring Provider:  Today's Date:  Date:   STOP BANG RISK ASSESSMENT S (snore) Have you been told that you snore?     YES   T (tired) Are you often tired, fatigued, or sleepy during the day?   YES  O (obstruction) Do you stop breathing, choke, or gasp during sleep? NO   P (pressure) Do you have or are you being treated for high blood pressure? YES   B (BMI) Is your body index greater than 35 kg/m? YES   A (age) Are you 21 years old or older? YES   N (neck) Do you have a neck circumference greater than 16 inches?   YES   G (gender) Are you a female? NO   TOTAL STOP/BANG "YES" ANSWERS                                                                        For Office Use Only              Procedure Order Form    YES to 3+ Stop Bang questions OR two clinical symptoms - patient qualifies for WatchPAT (CPT 95800)             Clinical Notes: Will consult Sleep Specialist and refer for management of therapy due to patient increased risk of Sleep Apnea. Ordering a sleep study due to the following two clinical symptoms: Excessive daytime sleepiness G47.10 / Gastroesophageal reflux K21.9 / Nocturia R35.1 / Morning Headaches G44.221 / Difficulty concentrating R41.840 / Memory problems or poor judgment G31.84 / Personality changes or irritability R45.4 / Loud snoring R06.83 / Depression F32.9 / Unrefreshed by sleep G47.8 / Impotence N52.9 / History of high blood pressure R03.0 / Insomnia G47.00    I understand that I am proceeding with a home sleep apnea test as ordered by my treating physician. I understand that untreated sleep apnea is a serious cardiovascular risk factor and it is my responsibility to perform the test and seek management for sleep apnea. I will be contacted with the results and be managed for sleep apnea by a local sleep physician. I will be receiving equipment and further instructions from [facility]. I shall promptly ship back the equipment via the included mailing label. I understand my insurance will be billed for the test and as the patient I am responsible for any insurance related out-of-pocket costs incurred. I have been provided with written instructions and can call for additional video or telephonic instruction, with 24-hour availability of qualified personnel to answer any questions: Patient Help Desk [phone].  Patient Signature ______________________________________________________   Date______________________ Patient Telemedicine Verbal Consent

## 2023-05-18 NOTE — Telephone Encounter (Signed)
 Via, Lorelle Formosa, LPN  You; Bertram Millard, RN11 minutes ago (9:51 AM)    Okey Regal, Please give the pt the Pin # to the pt when you give her the [facility] One-HST device. Thanks, Rory Percy, Just FYI: Instructions for covering staff: 1. Please contact patient in 2 weeks if WatchPAT study results are not available yet. Remind patient to complete test. 2. If patient declines to proceed with test, please confirm that box is unopened and remind patient to return it to the office within 30 days. Route phone note to [facility] SLEEP STUDIES pool for tracking. 3. If box has been opened, please route phone note to [facility] DIV SLEEP STUDIES pool to have device de-initialized and processed for billing.   Thanks, Woody Seller, [place] minutes ago (9:51 AM)    Ordering provider: Dr Tenny Craw Associated diagnoses: Snoring-R06.83   WatchPAT PA obtained on 05/18/2023 by Via, Lorelle Formosa, LPN. Authorization: Per the Medicare Part A and B website-does not require a PA for CPT Code: 56433 Per the [facility] Provider Portal: 95800 Description Sleep study, unattended, simultaneous recording; heart rate, oxygen saturation, respiratory analysis (eg, by airflow or peripheral arterial tone), and sleep time Inquiry summary Notification/Prior Authorization not required for this service.   4.   Patient not notified of PIN (1234) on 05/18/2023 as the pt does not have a WatchPAT One-HST Device yet. Forwarding this note to Danielle Rankin so she can provide the Pin # to the pt when she provides the pt with the device.   Phone note routed to covering staff for follow-up.

## 2023-05-18 NOTE — Telephone Encounter (Signed)
**Note De-Identified Janye Maynor Obfuscation** Ordering provider: Dr Tenny Craw Associated diagnoses: Snoring-R06.83  WatchPAT PA obtained on 05/18/2023 by Bailea Beed, Lorelle Formosa, LPN. Authorization: Per the Medicare Part A and B website-does not require a PA for CPT Code: 40981 Per the [facility] Provider Portal: 95800 Description Sleep study, unattended, simultaneous recording; heart rate, oxygen saturation, respiratory analysis (eg, by airflow or peripheral arterial tone), and sleep time Inquiry summary Notification/Prior Authorization not required for this service.  4.   Patient not notified of PIN (1234) on 05/18/2023 as the pt does not have a WatchPAT One-HST Device yet. Forwarding this note to Danielle Rankin so she can provide the Pin # to the pt when she provides the pt with the device.  Phone note routed to covering staff for follow-up.

## 2023-05-18 NOTE — Telephone Encounter (Signed)
-----   Message from [facility] Okey Regal F sent at 05/16/2023  2:33 PM EDT ----- Regarding: RE: Donnamae Jude,   Got your staff message. I saw you put a stopbang but I do not see the questions were answered and what the score was. Let me know if I missed it elsewhere. Once I have stop bang I will call the pt to come by and set up.   Thank you Okey Regal ----- Message ----- From: Bertram Millard, RN Sent: 05/16/2023   1:54 PM EDT To: Tarri Fuller, CMA; Cv Div Sleep Studies Subject: Patsey Berthold! Dr Tenny Craw ordered an Donnie Coffin on this lady outside of an office visit.Marland KitchenMarland Kitchen I will place the order and get the Stop Bang number.   Thank you soooo much!! Gustabo Gordillo B.

## 2023-05-21 ENCOUNTER — Ambulatory Visit (INDEPENDENT_AMBULATORY_CARE_PROVIDER_SITE_OTHER): Payer: Medicare Other | Admitting: Family Medicine

## 2023-05-22 ENCOUNTER — Encounter (INDEPENDENT_AMBULATORY_CARE_PROVIDER_SITE_OTHER): Payer: Self-pay | Admitting: Cardiology

## 2023-05-22 ENCOUNTER — Encounter: Payer: Self-pay | Admitting: Family Medicine

## 2023-05-22 ENCOUNTER — Telehealth: Payer: Self-pay | Admitting: Radiology

## 2023-05-22 ENCOUNTER — Ambulatory Visit (INDEPENDENT_AMBULATORY_CARE_PROVIDER_SITE_OTHER): Admitting: Family Medicine

## 2023-05-22 ENCOUNTER — Ambulatory Visit: Payer: Self-pay | Admitting: Family Medicine

## 2023-05-22 VITALS — BP 122/82 | HR 88 | Temp 98.3°F | Ht 66.0 in | Wt 217.0 lb

## 2023-05-22 DIAGNOSIS — G4733 Obstructive sleep apnea (adult) (pediatric): Secondary | ICD-10-CM | POA: Diagnosis not present

## 2023-05-22 DIAGNOSIS — E1165 Type 2 diabetes mellitus with hyperglycemia: Secondary | ICD-10-CM

## 2023-05-22 DIAGNOSIS — M79672 Pain in left foot: Secondary | ICD-10-CM

## 2023-05-22 DIAGNOSIS — M722 Plantar fascial fibromatosis: Secondary | ICD-10-CM | POA: Insufficient documentation

## 2023-05-22 MED ORDER — CELECOXIB 200 MG PO CAPS: 200.0000 mg | ORAL_CAPSULE | Freq: Every day | ORAL | 0 refills | Status: DC

## 2023-05-22 NOTE — Telephone Encounter (Signed)
 1. Patient agreement reviewed and signed on 05/22/23 2. WatchPAT issued to patient on 05/22/23 by Clyda Hurdle. Patient aware to not open the WatchPAT box until contacted with the activation PIN. 3. Patient profile initialized in CloudPAT on 05/22/23 by Orpha Bur S 4. Device serial number: [id number]

## 2023-05-22 NOTE — Progress Notes (Signed)
 Patient Office Visit  Assessment & Plan:  Left foot pain -     Celecoxib; Take 1 capsule (200 mg total) by mouth daily.  Dispense: 30 capsule; Refill: 0  Type 2 diabetes mellitus with hyperglycemia, without long-term current use of insulin (HCC)  Plantar fasciitis -     Celecoxib; Take 1 capsule (200 mg total) by mouth daily.  Dispense: 30 capsule; Refill: 0   Information given regarding plan of fasciitis.  Celebrex 200 mg to take 1/day with food.  If no improvement or worsening she is to notify us.  We decided not to do an x-ray today since she has not had any trauma. No follow-ups on file.   Subjective:    Patient ID: [name], female    DOB: [birth year]  Age: 58 y.o. MRN: [id number]  Chief Complaint  Patient presents with   Foot Pain    X 2 days. Left foot pain. NKI.    HPI Left foot pain x 2 days. No trauma and no increase in activity level. Pain level 7-8/10. Tylenol and OTC Advil has been taking 2 at once twice a day but not helping too much. Pt is able to put weight on it but has discomfort over the bottom of foot. Pt does wear Shon Baton sneakers but they are almost 56 years old. No previous hx of plantar fasciitis or gout. FH of gout so patient was concerned about this. Pt cannot take Toradol but can do NSAIDs.  Pt has type 2 diabetes and has been trying to improve lifestyle changes along with her Rybelsus. Pt going to weight management center   The 10-year ASCVD risk score (Arnett DK, et al., 2019) is: 4.6%  Past Medical History:  Diagnosis Date   Anxiety    Anxiety    Back pain    Bipolar 1 disorder (HCC)    Bipolar disorder (HCC)    Chest pain    Constipation    Depression    Depression    Diabetes (HCC)    Diabetes mellitus without complication (HCC)    Type 2   Dyspnea    r/t anxiety   Encounter for neuropsychological testing    at [facility] 3/20-suggest possible somatization   Fatty liver    GERD (gastroesophageal reflux disease)    Headache    High blood  pressure    High cholesterol    Hypertension    Joint pain    Migraines    Pseudoseizure    Last seizure March/April 2024   PTSD (post-traumatic stress disorder)    Rheumatoid arthritis (HCC)    Rosacea    Seizures (HCC)    [facility] (Dr. 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 1990s   ANTERIOR LAT LUMBAR FUSION N/A 08/03/2022   Procedure: Lumbar two-three extreme Lateral Interbody Fusion;  Surgeon: Barnett Abu, MD;  Location: [facility] OR;  Service: Neurosurgery;  Laterality: N/A;   APPENDECTOMY     BACK SURGERY     x2   CHOLECYSTECTOMY     COLONOSCOPY WITH PROPOFOL N/A 07/20/2021   Surgeon: Corbin Ade, MD;  Three 6-8 mm polyps in the descending colon  and cecum removed, diverticulosis in the entire colon, otherwise normal exam.  Pathology with tubular adenomas.  Recommended 5-year surveillance.   CYST EXCISION     on ovaries   lumbar     ruptured disc in lumbar taken out   POLYPECTOMY  07/20/2021   Procedure: POLYPECTOMY;  Surgeon: Corbin Ade, MD;  Location: [facility] SUITE;  Service: Endoscopy;;   ROTATOR CUFF REPAIR Left    TONSILLECTOMY     TUBAL LIGATION     Social History   Tobacco Use   Smoking status: Former    Current packs/day: 0.00    Average packs/day: 0.5 packs/day for 10.0 years (5.0 ttl pk-yrs)    Types: Cigarettes    Start date: 05/14/1981    Quit date: 05/15/1991    Years since quitting: 32.0   Smokeless tobacco: Never  Vaping Use   Vaping status: Never Used  Substance Use Topics   Alcohol use: Yes    Comment: socially   Drug use: No   Family History  Problem Relation Age of Onset   Obesity Father    Hyperlipidemia Father    Diabetes Father     Heart disease Father 26   Hypertension Father    Sleep apnea Father    Breast cancer Paternal Aunt        ? age of dx   Cancer Maternal Grandfather        colon cancer (70's)   Diabetes Paternal Grandmother    Diabetes Paternal Grandfather    Cancer Cousin        breast   Breast cancer Cousin    Breast cancer Cousin        paternal   Breast cancer Cousin    Liver cancer Neg Hx    Allergies  Allergen Reactions   Penicillins Hives    Has patient had a PCN reaction causing immediate rash, facial/tongue/throat swelling, SOB or lightheadedness with hypotension: Yes Has patient had a PCN reaction causing severe rash involving mucus membranes or skin necrosis: No Has patient had a PCN reaction that required hospitalization No Has patient had a PCN reaction occurring within the last 10 years: No If all of the above answers are "NO", then may proceed with Cephalosporin use.     Toradol [Ketorolac Tromethamine] Hives   Hydromorphone     Other reaction(s): Hallucination   Lamotrigine Rash    ROS    Objective:    BP 122/82   Pulse 88   Temp 98.3 F (36.8 C)   Ht 5\' 6"  (1.676 m)   Wt 217 lb (98.4 kg)   SpO2 99%   BMI 35.02 kg/m  BP Readings from Last 3 Encounters:  05/22/23 122/82  05/16/23 112/79  05/14/23 100/64   Wt Readings from Last 3 Encounters:  05/22/23 217 lb (98.4 kg)  05/14/23 217 lb (98.4 kg)  05/02/23 216 lb (98 kg)    Physical Exam Vitals reviewed.  Musculoskeletal:     Left foot: Tenderness present. No swelling, deformity, bunion or bony tenderness. Normal pulse.     Comments: Pt has tenderness over heel and plantar fascia to palpation. Pt has low arches. Pt is able to bear weight but has discomfort.   Neurological:     Mental Status: She is alert.      No results found for any visits on 05/22/23.

## 2023-05-22 NOTE — Telephone Encounter (Signed)
  Chief Complaint: left foot pain  Symptoms: pain and swelling  Frequency: 2 days   Disposition: [] ED /[] Urgent Care (no appt availability in office) / [x] Appointment(In office/virtual)/ []  [facility]/ [] Home Care/ [] Refused Recommended Disposition /[]  Mobile Bus/ []  Follow-up with PCP Additional Notes: Pt complaining of left foot pain with some swelling that started 2 days ago. No discoloring or warm to touch. Pt has not injured it and is unsure of cause. Pt mentioned her toenails on right foot has been coming off and thinks it is fungus. Pt rated pain 6-7. Pt has appt today  @ 1615. RN gave care advice and pt verbalized understanding.               Copied from CRM [id number]. Topic: Clinical - Red Word Triage >> May 22, 2023 12:27 PM Geroge Baseman wrote: Red Word that prompted transfer to Nurse Triage: Left foot, lots of pain in the arch of the foot and into the ankle. Onset 2 days. Reason for Disposition  [1] Swollen foot AND [2] no fever  (Exceptions: localized bump from bunions, calluses, insect bite, sting)  Answer Assessment - Initial Assessment Questions 1. ONSET: "When did the pain start?"      2 days ago  2. LOCATION: "Where is the pain located?"      Left foot-inside  3. PAIN: "How bad is the pain?"    (Scale 1-10; or mild, moderate, severe)  - MILD (1-3): doesn't interfere with normal activities.   - MODERATE (4-7): interferes with normal activities (e.g., work or school) or awakens from sleep, limping.   - SEVERE (8-10): excruciating pain, unable to do any normal activities, unable to walk.      6-7  5. CAUSE: "What do you think is causing the foot pain?"     Unsure  6. OTHER SYMPTOMS: "Do you have any other symptoms?" (e.g., leg pain, rash, fever, numbness)     Denies  Protocols used: Foot Pain-A-AH

## 2023-05-28 ENCOUNTER — Encounter (INDEPENDENT_AMBULATORY_CARE_PROVIDER_SITE_OTHER): Payer: Self-pay | Admitting: Family Medicine

## 2023-05-28 ENCOUNTER — Ambulatory Visit (INDEPENDENT_AMBULATORY_CARE_PROVIDER_SITE_OTHER): Payer: Medicare Other | Admitting: Family Medicine

## 2023-05-28 VITALS — BP 99/64 | HR 84 | Temp 97.3°F | Ht 66.0 in | Wt 217.0 lb

## 2023-05-28 DIAGNOSIS — Z6841 Body Mass Index (BMI) 40.0 and over, adult: Secondary | ICD-10-CM

## 2023-05-28 DIAGNOSIS — Z7985 Long-term (current) use of injectable non-insulin antidiabetic drugs: Secondary | ICD-10-CM

## 2023-05-28 DIAGNOSIS — K760 Fatty (change of) liver, not elsewhere classified: Secondary | ICD-10-CM

## 2023-05-28 DIAGNOSIS — G4733 Obstructive sleep apnea (adult) (pediatric): Secondary | ICD-10-CM

## 2023-05-28 DIAGNOSIS — E559 Vitamin D deficiency, unspecified: Secondary | ICD-10-CM

## 2023-05-28 DIAGNOSIS — I152 Hypertension secondary to endocrine disorders: Secondary | ICD-10-CM | POA: Diagnosis not present

## 2023-05-28 DIAGNOSIS — E785 Hyperlipidemia, unspecified: Secondary | ICD-10-CM | POA: Diagnosis not present

## 2023-05-28 DIAGNOSIS — E1159 Type 2 diabetes mellitus with other circulatory complications: Secondary | ICD-10-CM | POA: Diagnosis not present

## 2023-05-28 DIAGNOSIS — E1169 Type 2 diabetes mellitus with other specified complication: Secondary | ICD-10-CM | POA: Insufficient documentation

## 2023-05-28 DIAGNOSIS — E538 Deficiency of other specified B group vitamins: Secondary | ICD-10-CM

## 2023-05-28 MED ORDER — B COMPLEX VITAMINS PO CAPS: ORAL_CAPSULE | ORAL | Status: AC

## 2023-05-28 MED ORDER — TIRZEPATIDE 2.5 MG/0.5ML ~~LOC~~ SOAJ: 2.5000 mg | SUBCUTANEOUS | 0 refills | Status: DC

## 2023-05-28 MED ORDER — VITAMIN D (ERGOCALCIFEROL) 1.25 MG (50000 UNIT) PO CAPS: 50000.0000 [IU] | ORAL_CAPSULE | ORAL | 0 refills | Status: DC

## 2023-05-28 NOTE — Progress Notes (Signed)
 [name], D.O.  ABFM, ABOM Clinical Bariatric Medicine Physician  Office located at: [address] [place], [place]  [address]     Assessment and Plan:   No orders of the defined types were placed in this encounter.   Medications Discontinued During This Encounter  Medication Reason   Semaglutide (RYBELSUS) 3 MG TABS Change in therapy     Meds ordered this encounter  Medications   tirzepatide (MOUNJARO) 2.5 MG/0.5ML Pen    Sig: Inject 2.5 mg into the skin once a week. Every Thursday    Dispense:  2 mL    Refill:  0   b complex vitamins capsule    Sig: With b12 daily   Vitamin D, Ergocalciferol, (DRISDOL) 1.25 MG (50000 UNIT) CAPS capsule    Sig: Take 1 capsule (50,000 Units total) by mouth every 7 (seven) days.    Dispense:  4 capsule    Refill:  0     FOR THE DISEASE OF OBESITY: BMI 30.0-39.9, adult - Current 35.04 Morbid obesity START BMI 35.04-DATE 05/14/23 Assessment & Plan: Since last office visit on 05/14/2023, patient's muscle mass has decreased by 0.8 lbs. Fat mass has increased by 1 lb. Total body water has increased by 1.8 lbs.  Counseling done on how various foods will affect these numbers and how to maximize success  Total lbs lost to date: 0 lbs Total weight loss percentage to date: 0%    Recommended Dietary Goals [name] is currently in the action stage of change. As such, her goal is to continue weight management plan.  She has agreed to: continue current plan   Behavioral Intervention We discussed the following today: Complex vs. Simple carb, increasing lean protein intake to established goals, decreasing simple carbohydrates , reading food labels , continue to work on implementation of reduced calorie nutritional plan, better snacking choices, and focusing on food with a 10:1 ratio of calories: grams of protein  Additional resources provided today: Handout on poorly controlled sleep apnea, handout on CAT 2 meal plan and Handout  on CAT 1-2 lunch options  Evidence-based interventions for health behavior change were utilized today including the discussion of self monitoring techniques, problem-solving barriers and SMART goal setting techniques.  Regarding patient's less desirable eating habits and patterns, we employed the technique of small changes.     Recommended Physical Activity Goals [name] has been advised to work up to 150 minutes of moderate intensity aerobic activity a week and strengthening exercises 2-3 times per week for cardiovascular health, weight loss maintenance and preservation of muscle mass.   She has agreed to :  Increase the intensity, frequency or duration of aerobic exercises     Pharmacotherapy We both agreed to : Start Mounjaro 2.5 mg once weekly on Thursdays   FOR ASSOCIATED CONDITIONS ADDRESSED TODAY:  Type 2 diabetes mellitus with obesity [facility]) Assessment & Plan: Lab Results  Component Value Date   HGBA1C 6.7 (H) 05/14/2023   HGBA1C 6.6 (H) 01/16/2023   HGBA1C 6.2 (H) 07/27/2022   INSULIN 17.8 05/14/2023   Lab Results  Component Value Date   WBC 7.1 05/14/2023   HGB 13.1 05/14/2023   HCT 38.7 05/14/2023   MCV 87 05/14/2023   MCH 29.6 05/14/2023   RDW 12.3 05/14/2023   PLT 321 05/14/2023   Lab Results  Component Value Date   CREATININE 0.70 05/14/2023   BUN 9 05/14/2023   NA 139 05/14/2023   K 4.3 05/14/2023   CL  103 05/14/2023   CO2 20 05/14/2023   Lab Results  Component Value Date   TSH 1.890 05/14/2023   FREET4 1.01 05/14/2023  [name] is currently taking Rybelsus 3 mg once daily. Pt was given Rybelsus starting on 01/16/2023, per her last obtained labs her A1c has worsened from 6.6 to 6.7 despite this medication. This is the highest her A1c has been in two years. [name]'s insulin levels are over 3x the goal of <5. Pt denies regularly checking her blood sugars. Glucose levels are a high normal of 118, diabetic well controlled blood sugars are <120. Blood counts, kidney  levels, and thyroid all WNL.   Discussed starting Ozempic or Mounjaro injections. Informed pt of all risks and benefits that come with both medications. Will switch from Rybelsus to [facility] injection. Start Mounjaro 2.5 mg once weekly on Thursdays, discontinue Rybelsus. Pt denies h/o pancreatitis and Fhx of medullary thyroid carcinoma or multiple endocrine neoplasia type II. Pt verbalizes agreement with new treatment plan. Explained role of simple carbs and insulin levels on hunger and cravings. Continue following high protein, low carb prudent nutrition plan. Will continue to monitor condition closely.   Relevant Orders: -     Tirzepatide; Inject 2.5 mg into the skin once a week. Every Thursday  Dispense: 2 mL; Refill: 0   Hypertension associated with diabetes [facility]) Assessment & Plan: BP Readings from Last 3 Encounters:  05/28/23 99/64  05/22/23 122/82  05/16/23 112/79  [name] is taking Norvasc 10 mg daily, Hctz 25 mg daily, Losartan 50 mg daily, and Lopressor 25 mg twice daily. Her BP is within low-normal range today. Pt is asymptomatic. Pt will see cardiologist in the near future. Recommended pt decrease Lopressor d/t being obesogenic, but encouraged pt to consult with cardiologist and follow their instruction. Will monitor closely alongside specialist.    Metabolic dysfunction-associated steatotic liver disease (MASLD) Assessment & Plan:    Component Value Date/Time   PROT 6.7 05/14/2023 1057   ALBUMIN 4.3 05/14/2023 1057   AST 36 05/14/2023 1057   ALT 40 (H) 05/14/2023 1057   ALKPHOS 198 (H) 05/14/2023 1057   BILITOT 0.5 05/14/2023 1057   BILIDIR 0.1 03/02/2023 0943   IBILI 0.3 03/02/2023 0943  Reviewed last obtained labs with pt, alkaline phosphatase levels are likely elevated d/t low Vit D levels. Pt had an ultrasound on 10/20/2020 per PCP which confirmed diffuse hepatic steatosis. "Accentuated echogenicity with poor sonic penetration favoring hepatic steatosis. Probable fatty  sparing along the gallbladder fossa region measuring about 2.3 by 1.1 cm. Portal vein is patent on color Doppler imaging with normal direction of blood flow towards the liver."  Losing 10% of body weight, decreasing saturated/trans fats in diet, and increasing daily exercise can improve this condition. Will continue to monitor alongside PCP.    Hyperlipidemia associated with type 2 diabetes mellitus (HCC) Assessment & Plan: Lab Results  Component Value Date   CHOL 212 (H) 05/14/2023   HDL 59 05/14/2023   LDLCALC 129 (H) 05/14/2023   TRIG 134 05/14/2023   CHOLHDL 3.6 05/14/2023  Pt is on Crestor 10 mg daily, condition managed by Dr. Tenny Craw of cardiology.. Per last obtained labs, LDL is elevated at 129, ideal goal in diabetic is <70. Recommended pt increase dose to 20 mg. Encouraged to f/up with cardiologist regarding last obtained labs and potential change in medication regimen during next appointment on 03/31. Loran agrees to continue with meds and/or our treatment plan of a heart-heathy, low cholesterol meal plan. Will continue  to monitor alongside cardiologist.    OSA (obstructive sleep apnea) Assessment & Plan: Parrish recently received a sleep study last Tuesday per cardiology. She is awaiting results to determine if she will receive a CPAP for her sleep apnea. Encouraged pt to notify us of results once she is made aware. Will monitor condition closely.    B12 deficiency - new onset Assessment & Plan: Lab Results  Component Value Date   VITAMINB12 461 05/14/2023   FOLATE 4.1 05/14/2023  Reviewed labs from LOV with pt, B12 was sub-optimal at 461. Ideal goal of >500 reviewed with pt. Recommend pt start B complex 500 mcg daily or 1000 mcg every other day to improve B12 levels. Continue following RCNP with a focus on B12 rich foods, such as lean red meats, eggs, and seafood. All questions thoroughly answered regarding new diagnosis and supplement. Will monitor condition closely.   Relevant  Orders: -     B Complex Vitamins; With b12 daily   Vitamin D deficiency - new onset Assessment & Plan: Lab Results  Component Value Date   VD25OH 13.0 (L) 05/14/2023  This is a new diagnosis for Fable. Per last obtained labs, pt's Vit D levels were below goal of 50-70. I discussed the importance of vitamin D to the patient's health and well-being as well as to their ability to lose weight. It has been show that administration of vitamin D supplementation leads to improved satiety and a decrease in inflammatory markers. Hence, low Vitamin D levels may be linked to an increased risk of cardiovascular events and even increased risk of cancers. Will start pt on ERGO 50,000 units once weekly. Pt's questions and concerns regarding this condition and new supplementation addressed. Will monitor levels regularly.   Relevant Orders: -     Vitamin D (Ergocalciferol); Take 1 capsule (50,000 Units total) by mouth every 7 (seven) days.  Dispense: 4 capsule; Refill: 0  FOLLOW UP:   Return in about 16 days (around 06/13/2023). She was informed of the importance of frequent follow up visits to maximize her success with intensive lifestyle modifications for her multiple health conditions.  Subjective:   Chief complaint: Obesity Liliauna is here to discuss her progress with her obesity treatment plan. She is on the the Category 2 Plan and states she is following her eating plan approximately 25% of the time. She states she is walking 5-7 days per week.  Interval History:  SHARLENE MCCLUSKEY is here today for her first follow-up office visit since starting the program with [place]. Patient's weight has not changed. Esme reports the MP has been hard and she does not like vegetables. She also denies weighing her meals. However, pt endorses some days where she did eat everything on plan. Breakfast has been easy, but lunch is more difficult because she eats by herself.   All blood work/ lab tests that were recently ordered  by myself or an outside provider were reviewed with patient today per their request. Extended time was spent counseling her on all new disease processes that were discovered or preexisting ones that are affected by BMI.  she understands that many of these abnormalities will need to monitored regularly along with the current treatment plan of prudent dietary changes, in which we are making each and every office visit, to improve these health parameters.  Pharmacotherapy for weight loss: She is currently taking  Rybelsus 3 mg once daily  for medical weight loss.  Denies side effects.    Review  of Systems:  Pertinent positives were addressed with patient today.  Reviewed by clinician on day of visit: allergies, medications, problem list, medical history, surgical history, family history, social history, and previous encounter notes.   Weight Summary and Biometrics   Weight Lost Since Last Visit: 0  Weight Gained Since Last Visit: 0   Vitals Temp: (!) 97.3 F (36.3 C) BP: 99/64 Pulse Rate: 84 SpO2: 98 %   Anthropometric Measurements Height: 5\' 6"  (1.676 m) Weight: 217 lb (98.4 kg) BMI (Calculated): 35.04 Weight at Last Visit: 217lb Weight Lost Since Last Visit: 0 Weight Gained Since Last Visit: 0 Starting Weight: 217lb Total Weight Loss (lbs): 0 lb (0 kg) Peak Weight: 232lb   Body Composition  Body Fat %: 46.9 % Fat Mass (lbs): 102 lbs Muscle Mass (lbs): 109.6 lbs Total Body Water (lbs): 79.2 lbs Visceral Fat Rating : 13   Other Clinical Data Fasting: no Labs: no Today's Visit #: 2 Starting Date: 05/14/23     Objective:   PHYSICAL EXAM:  Blood pressure 99/64, pulse 84, temperature (!) 97.3 F (36.3 C), height 5\' 6"  (1.676 m), weight 217 lb (98.4 kg), SpO2 98%. Body mass index is 35.02 kg/m.  General: she is overweight, cooperative and in no acute distress.   HEENT: EOMI, sclerae are anicteric. Lungs: Normal breathing effort, no conversational  dyspnea. M-Sk:  Normal gross ROM * 4 extremities  PSYCH: Has normal mood, affect and thought process. Neurologic: No gross sensory or motor deficits. Well developed, A and O * 3  DIAGNOSTIC DATA REVIEWED:  BMET    Component Value Date/Time   NA 139 05/14/2023 1057   K 4.3 05/14/2023 1057   CL 103 05/14/2023 1057   CO2 20 05/14/2023 1057   GLUCOSE 118 (H) 05/14/2023 1057   GLUCOSE 137 (H) 01/16/2023 0845   BUN 9 05/14/2023 1057   CREATININE 0.70 05/14/2023 1057   CREATININE 0.61 01/16/2023 0845   CALCIUM 9.2 05/14/2023 1057   GFRNONAA >60 07/27/2022 1400   GFRNONAA 106 02/16/2020 1144   GFRAA 123 02/16/2020 1144   Lab Results  Component Value Date   HGBA1C 6.7 (H) 05/14/2023   HGBA1C 5.2 05/19/2013   Lab Results  Component Value Date   INSULIN 17.8 05/14/2023   Lab Results  Component Value Date   TSH 1.890 05/14/2023   CBC    Component Value Date/Time   WBC 7.1 05/14/2023 1057   WBC 6.7 03/02/2023 0943   RBC 4.43 05/14/2023 1057   RBC 4.77 03/02/2023 0943   HGB 13.1 05/14/2023 1057   HCT 38.7 05/14/2023 1057   PLT 321 05/14/2023 1057   MCV 87 05/14/2023 1057   MCH 29.6 05/14/2023 1057   MCH 29.4 03/02/2023 0943   MCHC 33.9 05/14/2023 1057   MCHC 33.3 03/02/2023 0943   RDW 12.3 05/14/2023 1057   Iron Studies No results found for: "IRON", "TIBC", "FERRITIN", "IRONPCTSAT" Lipid Panel     Component Value Date/Time   CHOL 212 (H) 05/14/2023 1057   TRIG 134 05/14/2023 1057   HDL 59 05/14/2023 1057   CHOLHDL 3.6 05/14/2023 1057   CHOLHDL 3.5 01/16/2023 0845   VLDL 26 05/28/2019 0955   LDLCALC 129 (H) 05/14/2023 1057   LDLCALC 125 (H) 01/16/2023 0845   Hepatic Function Panel     Component Value Date/Time   PROT 6.7 05/14/2023 1057   ALBUMIN 4.3 05/14/2023 1057   AST 36 05/14/2023 1057   ALT 40 (H) 05/14/2023 1057   ALKPHOS 198 (  H) 05/14/2023 1057   BILITOT 0.5 05/14/2023 1057   BILIDIR 0.1 03/02/2023 0943   IBILI 0.3 03/02/2023 0943       Component Value Date/Time   TSH 1.890 05/14/2023 1057   Nutritional Lab Results  Component Value Date   VD25OH 13.0 (L) 05/14/2023    Attestations:   I, Camryn Mix, acting as a Stage manager for [employer], DO., have compiled all relevant documentation for today's office visit on behalf of Thomasene Lot, DO, while in the presence of [employer], DO.  Reviewed by clinician on day of visit: allergies, medications, problem list, medical history, surgical history, family history, social history, and previous encounter notes pertinent to patient's obesity diagnosis.   I have spent 66 minutes in the care of the patient today including: preparing to see patient (e.g. review and interpretation of tests, old notes ), obtaining and/or reviewing separately obtained history, performing a medically appropriate examination or evaluation, counseling and educating the patient, ordering medications, test or procedures, documenting clinical information in the electronic or other health care record, and independently interpreting results and communicating results to the patient, family, or caregiver   I have reviewed the above documentation for accuracy and completeness, and I agree with the above. [name], D.O.  The 21st Century Cures Act was signed into law in 2016 which includes the topic of electronic health records.  This provides immediate access to information in MyChart.  This includes consultation notes, operative notes, office notes, lab results and pathology reports.  If you have any questions about what you read please let us know at your next visit so we can discuss your concerns and take corrective action if need be.  We are right here with you.

## 2023-05-29 ENCOUNTER — Ambulatory Visit: Attending: Internal Medicine

## 2023-05-29 ENCOUNTER — Other Ambulatory Visit: Payer: Self-pay

## 2023-05-29 ENCOUNTER — Telehealth: Payer: Self-pay

## 2023-05-29 DIAGNOSIS — G4733 Obstructive sleep apnea (adult) (pediatric): Secondary | ICD-10-CM | POA: Diagnosis not present

## 2023-05-29 DIAGNOSIS — R7989 Other specified abnormal findings of blood chemistry: Secondary | ICD-10-CM

## 2023-05-29 DIAGNOSIS — R0683 Snoring: Secondary | ICD-10-CM

## 2023-05-29 DIAGNOSIS — K760 Fatty (change of) liver, not elsewhere classified: Secondary | ICD-10-CM

## 2023-05-29 DIAGNOSIS — E7800 Pure hypercholesterolemia, unspecified: Secondary | ICD-10-CM

## 2023-05-29 DIAGNOSIS — R768 Other specified abnormal immunological findings in serum: Secondary | ICD-10-CM

## 2023-05-29 MED ORDER — ROSUVASTATIN CALCIUM 20 MG PO TABS: 20.0000 mg | ORAL_TABLET | Freq: Every day | ORAL | 3 refills | Status: AC

## 2023-05-29 NOTE — Telephone Encounter (Signed)
-----   Message from Armanda Magic sent at 05/29/2023 10:10 AM EDT ----- Please let patient know that they have sleep apnea.  Recommend therapeutic CPAP titration for treatment of patient's sleep disordered breathing.

## 2023-05-29 NOTE — Telephone Encounter (Unsigned)
 Notified patient of sleep study results and recommendations. All question were answered and patient verbalized understanding.

## 2023-05-29 NOTE — Procedures (Signed)
   SLEEP STUDY REPORT Patient Information Study Date: 05/22/2023 Patient Name: [name] Patient ID: [id number] Birth Date: 24-Jun-1965 Age: 58 Gender:  BMI: 35.1 (W=218 lb, H=5' 6'') Stopbang: 6 Referring Physician: Dietrich Pates, MD  TEST DESCRIPTION: Home sleep apnea testing was completed using the WatchPat, a Type 1 device, utilizing  peripheral arterial tonometry (PAT), chest movement, actigraphy, pulse oximetry, pulse rate, body position and snore.  AHI was calculated with apnea and hypopnea using valid sleep time as the denominator. RDI includes apneas,  hypopneas, and RERAs. The data acquired and the scoring of sleep and all associated events were performed in  accordance with the recommended standards and specifications as outlined in the AASM Manual for the Scoring of  Sleep and Associated Events 2.2.0 (2015).  FINDINGS:  1. Moderate Obstructive Sleep Apnea with AHI 21.3/hr.   2. No Central Sleep Apnea with pAHIc 0.9/hr.  3. Oxygen desaturations as low as 86%.  4. Mild to moderate snoring was present. O2 sats were < 88% for 2.6 min.  5. Total sleep time was 8 hrs and 50 min.  6. 23.8% of total sleep time was spent in REM sleep.   7. Normal sleep onset latency at 16 min  8. Shortened REM sleep onset latency at 44 min.   9. Total awakenings were 5.  10. Arrhythmia detection: None  DIAGNOSIS:  Moderate Obstructive Sleep Apnea (G47.33)  RECOMMENDATIONS: 1. Clinical correlation of these findings is necessary. The decision to treat obstructive sleep apnea (OSA) is usually  based on the presence of apnea symptoms or the presence of associated medical conditions such as Hypertension,  Congestive Heart Failure, Atrial Fibrillation or Obesity. The most common symptoms of OSA are snoring, gasping for  breath while sleeping, daytime sleepiness and fatigue.   2. Initiating apnea therapy is recommended given the presence of symptoms and/or associated conditions.  Recommend  proceeding with one of the following:   a. Auto-CPAP therapy with a pressure range of 5-20cm H2O.   b. An oral appliance (OA) that can be obtained from certain dentists with expertise in sleep medicine. These are  primarily of use in non-obese patients with mild and moderate disease.   c. An ENT consultation which may be useful to look for specific causes of obstruction and possible treatment  options.   d. If patient is intolerant to PAP therapy, consider referral to ENT for evaluation for hypoglossal nerve stimulator.   3. Close follow-up is necessary to ensure success with CPAP or oral appliance therapy for maximum benefit .  4. A follow-up oximetry study on CPAP is recommended to assess the adequacy of therapy and determine the need  for supplemental oxygen or the potential need for Bi-level therapy. An arterial blood gas to determine the adequacy of  baseline ventilation and oxygenation should also be considered.  5. Healthy sleep recommendations include: adequate nightly sleep (normal 7-9 hrs/night), avoidance of caffeine after  noon and alcohol near bedtime, and maintaining a sleep environment that is cool, dark and quiet.  6. Weight loss for overweight patients is recommended. Even modest amounts of weight loss can significantly  improve the severity of sleep apnea.  7. Snoring recommendations include: weight loss where appropriate, side sleeping, and avoidance of alcohol before  bed.  8. Operation of motor vehicle should not be performed when sleepy.  Signature: Armanda Magic, MD; [facility]; Diplomat, American Board of Sleep  Medicine Electronically Signed: 05/29/2023 10:09:16 AM

## 2023-06-01 NOTE — Telephone Encounter (Signed)
 Prior Authorization for TITRATION sent to [facility] via web portal. Tracking Number . No requirements found for this procedure.

## 2023-06-03 NOTE — Progress Notes (Unsigned)
 Cardiology Office Note   Date:  06/04/2023   ID:  [name], DOB 05/13/1965, MRN [id number]  PCP:  Donita Brooks, MD  Cardiologist:   Dietrich Pates, MD   Patient present for evaluaton of CP  Referred by Dr [name]     History of Present Illness: [name] is a 58 y.o. female with a hx of seizures and hypertension.  The patient was recently seen in the emergency room back in November for the evaluation of chest pain.  Patient says she has episodes of chest pain that are sharp not pleuritic they come and go often with activity.  They last about 15 or 20 seconds then ease on their own.  The patient also notes increased dyspnea on exertion.  If she walks less than 1/10 of a mile she is short of breath gives out.  She said about 6 months ago these things were not occurring.  Denies reflux   No difficulty swallowing    The patient was seen in the emergency room on 11/13.  Underwent CT angiogram to rule out PE this was negative.  Sent home.  The patient has a 1 year history of smoking quit 1991.  She does not know her cholesterol Father had coronary artery disease.  I saw the pt in 2020  At that visit I recomm a cardiac CT This was not done   Then an echo stress test was done 05/2019  Normal   Amlodipine added for HTN Since that time HTN increased and meds further adjusted     The patient was seen by Arelia Longest in Feb 2025   CCTA done on 05/16/23   Ca score was 4.88    Mild plaque noted in LAD, minimal in RCA    Fatty liver changes noted     Since seen the patient says she is no not had chest pain.  She admits that she may have been under increased stress when she saw Georgie Chard.  Her breathing is okay.  She is followed in obesity clinic.   Current Meds  Medication Sig   ACCU-CHEK GUIDE test strip USE AS DIRECTED TO MONITOR FSBS 1X DAILY. DX: R73.09.   amLODipine (NORVASC) 10 MG tablet Take 1 tablet (10 mg total) by mouth daily.   b complex vitamins capsule With b12 daily    Blood Glucose Monitoring Suppl (BLOOD GLUCOSE SYSTEM PAK) KIT Please dispense based on patient and insurance preference. Use as directed to monitor FSBS 1x daily. Dx: R73.09.   celecoxib (CELEBREX) 200 MG capsule Take 1 capsule (200 mg total) by mouth daily.   estradiol (VIVELLE-DOT) 0.1 MG/24HR patch Place 1 patch (0.1 mg total) onto the skin 2 (two) times a week.   hydrochlorothiazide (HYDRODIURIL) 25 MG tablet Take 1 tablet (25 mg total) by mouth daily.   HYDROcodone-acetaminophen (NORCO) 10-325 MG tablet Take 1 tablet by mouth every 4 (four) hours as needed for severe pain.   Lancets MISC Please dispense based on patient and insurance preference. Use as directed to monitor FSBS 1x daily. Dx: R73.09.   losartan (COZAAR) 50 MG tablet Take 1 tablet (50 mg total) by mouth daily.   metoprolol tartrate (LOPRESSOR) 25 MG tablet Take 1 tablet (25 mg total) by mouth 2 (two) times daily.   ondansetron (ZOFRAN) 4 MG tablet TAKE 1 TABLET BY MOUTH EVERY 8 HOURS AS NEEDED FOR NAUSEA AND VOMITING   ORACEA 40 MG CPDR Take 40 mg by mouth daily.  rosuvastatin (CRESTOR) 20 MG tablet Take 1 tablet (20 mg total) by mouth daily.   tirzepatide [facility]) 2.5 MG/0.5ML Pen Inject 2.5 mg into the skin once a week. Every Thursday   venlafaxine XR (EFFEXOR-XR) 150 MG 24 hr capsule Take 2 capsules (300 mg total) by mouth daily with breakfast.   Vitamin D, Ergocalciferol, (DRISDOL) 1.25 MG (50000 UNIT) CAPS capsule Take 1 capsule (50,000 Units total) by mouth every 7 (seven) days.   zonisamide (ZONEGRAN) 100 MG capsule Take 200mg  daily at bedtime     Allergies:   Penicillins, Toradol [ketorolac tromethamine], Hydromorphone, and Lamotrigine   Past Medical History:  Diagnosis Date   Anxiety    Anxiety    Back pain    Bipolar 1 disorder (HCC)    Bipolar disorder (HCC)    Chest pain    Constipation    Depression    Depression    Diabetes (HCC)    Diabetes mellitus without complication (HCC)    Type 2   Dyspnea     r/t anxiety   Encounter for neuropsychological testing    at [facility] 3/20-suggest possible somatization   Fatty liver    GERD (gastroesophageal reflux disease)    Headache    High blood pressure    High cholesterol    Hypertension    Joint pain    Migraines    Pseudoseizure    Last seizure March/April 2024   PTSD (post-traumatic stress disorder)    Rheumatoid arthritis (HCC)    Rosacea    Seizures (HCC)    [facility] (Dr. 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 1990s   ANTERIOR LAT LUMBAR FUSION N/A 08/03/2022   Procedure: Lumbar two-three extreme Lateral Interbody Fusion;  Surgeon: Barnett Abu, MD;  Location: [facility] OR;  Service: Neurosurgery;  Laterality: N/A;   APPENDECTOMY     BACK SURGERY     x2   CHOLECYSTECTOMY     COLONOSCOPY WITH PROPOFOL N/A 07/20/2021   Surgeon: Corbin Ade, MD;  Three 6-8 mm polyps in the descending colon and cecum removed, diverticulosis in the entire colon, otherwise normal exam.  Pathology with tubular adenomas.  Recommended 5-year surveillance.   CYST EXCISION     on ovaries   lumbar     ruptured disc in lumbar taken out   POLYPECTOMY  07/20/2021   Procedure: POLYPECTOMY;  Surgeon: Corbin Ade, MD;  Location: [facility] END[facility] SUITE;  Service: Endoscopy;;   ROTATOR CUFF REPAIR Left    TONSILLECTOMY     TUBAL LIGATION       Social History:  The patient  reports that she quit smoking about 32 years ago. Her smoking use included cigarettes. She started smoking about 42 years ago. She has a 5 pack-year smoking history. She has never used smokeless tobacco. She reports current alcohol use. She reports that she does not use drugs.   Family History:  The  patient's family history includes Breast cancer in her cousin, cousin, cousin, and paternal aunt; Cancer in her cousin and maternal grandfather; Diabetes in her father, paternal grandfather, and paternal grandmother; Heart disease (age of onset: 62) in her father; Hyperlipidemia  in her father; Hypertension in her father; Obesity in her father; Sleep apnea in her father.    ROS:  Please see the history of present illness. All other systems are reviewed and  Negative to the above problem except as noted.    PHYSICAL EXAM: VS:  BP 108/82   Pulse 95   Ht 5' 6.5" (1.689 m)   Wt 96.4 kg   SpO2 98%   BMI 33.81 kg/m   GEN: Morbidly obese 58 yo in no acute distress  HEENT: normal  Neck: no JVD, carotid bruits Cardiac: RRR; no murmur no lower extremity edema  Respiratory:  clear to auscultation  GI: soft, nontender,   No hepatomegaly  MS: no deformity Moving all extremities     EKG:  EKG is not ordered   Lipid Panel    Component Value Date/Time   CHOL 212 (H) 05/14/2023 1057   TRIG 134 05/14/2023 1057   HDL 59 05/14/2023 1057   CHOLHDL 3.6 05/14/2023 1057   CHOLHDL 3.5 01/16/2023 0845   VLDL 26 05/28/2019 0955   LDLCALC 129 (H) 05/14/2023 1057   LDLCALC 125 (H) 01/16/2023 0845      Wt Readings from Last 3 Encounters:  06/04/23 96.4 kg  05/28/23 98.4 kg  05/22/23 98.4 kg      ASSESSMENT AND PLAN: 1.  Chest pain.  Atypical recent CCTA shows no significant disease.  Patient says that the symptoms have resolved   2 CAD.  Minimal CAD on CCTA.  Recommend risk factor modification   3 hypertension.  Well-controlled  4.  Obesity.  Patient is followed in obesity clinic.  I have given her the resource for [employer] on metabolic send insulin.  Recommended she is stop processed foods, really minimize carbohydrates.  I would not recommend sprays for oil but instead recommend olive/avocado.  No seed oils.  Border is neutral.      Current medicines are reviewed at  length with the patient today.  The patient does not have concerns regarding medicines.  Signed, Dietrich Pates, MD  06/04/2023 10:56 PM    [facility] [address] [place], [place], [place]  [address] Phone: [phone]; Fax: [phone]

## 2023-06-04 ENCOUNTER — Ambulatory Visit: Payer: Medicare Other | Attending: Internal Medicine | Admitting: Internal Medicine

## 2023-06-04 ENCOUNTER — Encounter: Payer: Self-pay | Admitting: Internal Medicine

## 2023-06-04 VITALS — BP 108/82 | HR 95 | Ht 66.5 in | Wt 212.6 lb

## 2023-06-04 DIAGNOSIS — E1169 Type 2 diabetes mellitus with other specified complication: Secondary | ICD-10-CM

## 2023-06-04 DIAGNOSIS — E785 Hyperlipidemia, unspecified: Secondary | ICD-10-CM | POA: Diagnosis not present

## 2023-06-04 DIAGNOSIS — E7800 Pure hypercholesterolemia, unspecified: Secondary | ICD-10-CM | POA: Diagnosis not present

## 2023-06-04 NOTE — Patient Instructions (Signed)
 Medication Instructions:   Your physician recommends that you continue on your current medications as directed. Please refer to the Current Medication list given to you today.  *If you need a refill on your cardiac medications before your next appointment, please call your pharmacy*   Lab Work:  IN MAY 2025 AT ANY [facility] NEAR YOU--NMR LIPOPROFILE--PLEASE GO FASTING TO THIS LAB APPOINTMENT  If you have labs (blood work) drawn today and your tests are completely normal, you will receive your results only by: MyChart Message (if you have MyChart) OR A paper copy in the mail If you have any lab test that is abnormal or we need to change your treatment, we will call you to review the results.    Follow-Up: At [facility], you and your health needs are our priority.  As part of our continuing mission to provide you with exceptional heart care, our providers are all part of one team.  This team includes your primary Cardiologist (physician) and Advanced Practice Providers or APPs (Physician Assistants and Nurse Practitioners) who all work together to provide you with the care you need, when you need it.  Your next appointment:   8 month(s)  Provider:   DR. Tenny Craw    Other Instructions  CALL [phone] TO GET YOUR CPAP TITRATION SCHEDULED, PER OUR SLEEP TEAM      [facility] Contact for Alternative locations and appointment scheduling   [email or web address]   [email or web address]  [phone]           1st Floor: - Lobby - Registration  - Pharmacy  - Lab - Cafe  2nd Floor: - PV Lab - Diagnostic Testing (echo, CT, nuclear med)  3rd Floor: - Vacant  4th Floor: - TCTS (cardiothoracic surgery) - AFib Clinic - Structural Heart Clinic - Vascular Surgery  - Vascular Ultrasound  5th Floor: - HeartCare Cardiology (general and EP) - Clinical Pharmacy for coumadin, hypertension, lipid, weight-loss medications, and med management  appointments    Valet parking services will be available as well.

## 2023-06-06 ENCOUNTER — Ambulatory Visit: Payer: Medicare Other | Admitting: Adult Health

## 2023-06-06 ENCOUNTER — Encounter: Payer: Self-pay | Admitting: Adult Health

## 2023-06-06 VITALS — BP 110/68 | HR 84 | Ht 66.5 in | Wt 216.0 lb

## 2023-06-06 DIAGNOSIS — N898 Other specified noninflammatory disorders of vagina: Secondary | ICD-10-CM

## 2023-06-06 DIAGNOSIS — B379 Candidiasis, unspecified: Secondary | ICD-10-CM | POA: Diagnosis not present

## 2023-06-06 DIAGNOSIS — I10 Essential (primary) hypertension: Secondary | ICD-10-CM | POA: Diagnosis not present

## 2023-06-06 DIAGNOSIS — R232 Flushing: Secondary | ICD-10-CM | POA: Diagnosis not present

## 2023-06-06 DIAGNOSIS — Z79818 Long term (current) use of other agents affecting estrogen receptors and estrogen levels: Secondary | ICD-10-CM

## 2023-06-06 LAB — POCT WET PREP (WET MOUNT)

## 2023-06-06 MED ORDER — FLUCONAZOLE 150 MG PO TABS: ORAL_TABLET | ORAL | 1 refills | Status: DC

## 2023-06-06 MED ORDER — ESTRADIOL 0.1 MG/24HR TD PTTW: 1.0000 | MEDICATED_PATCH | TRANSDERMAL | 6 refills | Status: DC

## 2023-06-06 MED ORDER — NYSTATIN-TRIAMCINOLONE 100000-0.1 UNIT/GM-% EX OINT: TOPICAL_OINTMENT | CUTANEOUS | 1 refills | Status: DC

## 2023-06-06 NOTE — Progress Notes (Signed)
  Subjective:     Patient ID: [name], female   DOB: 12-13-1965, 58 y.o.   MRN: [id number]  HPI [name] is a 58 year old white female, married, sp hysterectomy back in follow up on hot flashes, is on Vivelle dot patch and she is good. BP is better too. She is having itching in vaginal area and noticed odor at times and has some vaginal dryness.  PCP is Dr [name]   Review of Systems Hot flashes better  +itching in vaginal area   noticed odor at times   some vaginal dryness.   Reviewed past medical,surgical, social and family history. Reviewed medications and allergies.  Objective:   Physical Exam BP 110/68 (BP Location: Right Arm, Patient Position: Sitting, Cuff Size: Normal)   Pulse 84   Ht 5' 6.5" (1.689 m)   Wt 216 lb (98 kg)   BMI 34.34 kg/m     Skin warm and dry. Lungs: clear to ausculation bilaterally. Cardiovascular: regular rate and rhythm.  Pelvic: external genitalia is normal in appearance, has red scaly rash inner thighs, vagina: white discharge without odor,urethra has no lesions or masses noted, cervix and uterus absent.  Wet prep: + few yeast buds Exam by Marylynn Pearson NP student under my supervision  [facility] - 06/06/23 [phone]       Pregnancy Intention Screening   Does the patient want to become pregnant in the next year? N/A    Does the patient's partner want to become pregnant in the next year? N/A    Would the patient like to discuss contraceptive options today? N/A      Contraception Wrap Up   Current Method Female Sterilization   hyst   End Method Female Sterilization   hyst   Contraception Counseling Provided No             Assessment:     1. Hot flashes (Primary) Hot flashes much better Will refill vivelle dot   2. Primary hypertension Continue meds and follow up with PCP  3. Current use of estrogen therapy   4. Vaginal dryness Try astroglide with sex  5. Itching in the vaginal area Will rx diflucan and mycolog - POCT Wet Prep [employer]  [place])  6. Yeast infection Will rx diflucan and mycolog ointment  Meds ordered this encounter  Medications   fluconazole (DIFLUCAN) 150 MG tablet    Sig: Take 1 now and 1 in 3 days, do not with Crestor    Dispense:  2 tablet    Refill:  1    Supervising Provider:   Duane Lope H [[id number]]   nystatin-triamcinolone ointment (MYCOLOG)    Sig: Use bid for 2 weeks then stop can repeat if necessary    Dispense:  30 g    Refill:  1    Supervising Provider:   Duane Lope H [[id number]]   estradiol (VIVELLE-DOT) 0.1 MG/24HR patch    Sig: Place 1 patch (0.1 mg total) onto the skin 2 (two) times a week.    Dispense:  8 patch    Refill:  6    Supervising Provider:   Lazaro Arms [[id number]]    - POCT Wet Prep [facility])     Plan:     Follow up in 6 months for ROS or sooner if needed

## 2023-06-12 ENCOUNTER — Ambulatory Visit (INDEPENDENT_AMBULATORY_CARE_PROVIDER_SITE_OTHER)

## 2023-06-12 ENCOUNTER — Ambulatory Visit: Admitting: Family Medicine

## 2023-06-12 DIAGNOSIS — Z23 Encounter for immunization: Secondary | ICD-10-CM

## 2023-06-12 NOTE — Progress Notes (Signed)
 Patient is in office today for a nurse visit for Immunization. Patient Injection was given in the  Left deltoid. Patient tolerated injection well.

## 2023-06-13 ENCOUNTER — Encounter (INDEPENDENT_AMBULATORY_CARE_PROVIDER_SITE_OTHER): Payer: Self-pay | Admitting: Family Medicine

## 2023-06-13 ENCOUNTER — Ambulatory Visit (INDEPENDENT_AMBULATORY_CARE_PROVIDER_SITE_OTHER): Admitting: Family Medicine

## 2023-06-13 VITALS — BP 102/69 | HR 102 | Temp 97.7°F | Ht 66.0 in | Wt 210.4 lb

## 2023-06-13 DIAGNOSIS — E1169 Type 2 diabetes mellitus with other specified complication: Secondary | ICD-10-CM

## 2023-06-13 DIAGNOSIS — Z7985 Long-term (current) use of injectable non-insulin antidiabetic drugs: Secondary | ICD-10-CM

## 2023-06-13 DIAGNOSIS — I152 Hypertension secondary to endocrine disorders: Secondary | ICD-10-CM | POA: Diagnosis not present

## 2023-06-13 DIAGNOSIS — K5903 Drug induced constipation: Secondary | ICD-10-CM | POA: Diagnosis not present

## 2023-06-13 DIAGNOSIS — E559 Vitamin D deficiency, unspecified: Secondary | ICD-10-CM

## 2023-06-13 DIAGNOSIS — E1159 Type 2 diabetes mellitus with other circulatory complications: Secondary | ICD-10-CM

## 2023-06-13 DIAGNOSIS — Z6833 Body mass index (BMI) 33.0-33.9, adult: Secondary | ICD-10-CM

## 2023-06-13 DIAGNOSIS — Z6841 Body Mass Index (BMI) 40.0 and over, adult: Secondary | ICD-10-CM

## 2023-06-13 MED ORDER — TIRZEPATIDE 5 MG/0.5ML ~~LOC~~ SOAJ: 5.0000 mg | SUBCUTANEOUS | 0 refills | Status: DC

## 2023-06-13 MED ORDER — POLYETHYLENE GLYCOL 3350 17 GM/SCOOP PO POWD: ORAL | Status: DC

## 2023-06-13 NOTE — Progress Notes (Signed)
 [name], D.O.  ABFM, ABOM Specializing in Clinical Bariatric Medicine  Office located at: [address] [place], [place]  [address]   Assessment and Plan:  No orders of the defined types were placed in this encounter.  Medications Discontinued During This Encounter  Medication Reason   tirzepatide [facility]) 2.5 MG/0.5ML Pen Dose change    Meds ordered this encounter  Medications   polyethylene glycol powder (GLYCOLAX/MIRALAX) 17 GM/SCOOP powder    Sig: BID until normal stool, Then 1 daily, then prn   tirzepatide (MOUNJARO) 5 MG/0.5ML Pen    Sig: Inject 5 mg into the skin once a week.    Dispense:  2 mL    Refill:  0     FOR THE DISEASE OF OBESITY:  BMI 30.0-39.9, adult - Current 33.98 Morbid obesity START BMI 35.04-DATE 05/14/23 Assessment & Plan: Since last office visit on 05/28/2023, patient's  Muscle mass has increased by 1.2 lbs. Fat mass has decreased by 8.4 lbs. Total body water has decreased by 5.6 lbs.  Counseling done on how various foods will affect these numbers and how to maximize success  Total lbs lost to date: 7 lbs Total weight loss percentage to date: 3.23%    Recommended Dietary Goals [name] is currently in the action stage of change. As such, her goal is to continue weight management plan.  She has agreed to: continue current plan   Behavioral Intervention We discussed the following today: decreasing simple carbohydrates , increasing water intake , and continue to practice mindfulness when eating  Additional resources provided today: None  Evidence-based interventions for health behavior change were utilized today including the discussion of self monitoring techniques, problem-solving barriers and SMART goal setting techniques.   Regarding patient's less desirable eating habits and patterns, we employed the technique of small changes.   Pt will specifically work on: Increase water intake with goal of half body weight in ounces for  next visit.    Recommended Physical Activity Goals [name] has been advised to work up to 150 minutes of moderate intensity aerobic activity a week and strengthening exercises 2-3 times per week for cardiovascular health, weight loss maintenance and preservation of muscle mass.   She has agreed to :  Think about enjoyable ways to increase daily physical activity and overcoming barriers to exercise   Pharmacotherapy We both agreed to : Increase Mounjaro to 5 mg once weekly   FOR ASSOCIATED CONDITIONS ADDRESSED TODAY:  Drug-induced constipation Type 2 diabetes mellitus with obesity [name]) Assessment & Plan: [name] is on Mounjaro 2.5 mg once weekly. This medication was started during LOV, pt endorses an increase in constipation. Hunger/cravings well controlled. [name] missed one dose on medication d/t pharmacy not having in stock. However, pt endorses a drastic decrease in cravings after properly adhering to MP. For constipation, pt used Duphalac to relieve symptoms.   Encouraged pt to ensure she is getting adequate amount of water intake; half of her body weight in ounces. Recommended taking Miralax twice daily until symptoms resolve to normal stool schedule, then decrease to once daily. Continue to increase fiber and lean protein in diet. Due to good compliance, will increase Mounjaro dose to 5 mg once weekly. Will continue to monitor condition closely.   Relevant Orders: -     Polyethylene Glycol 3350; BID until normal stool, Then 1 daily, then prn -     Tirzepatide; Inject 5 mg into the skin once a week.  Dispense: 2 mL; Refill: 0  Hypertension associated with diabetes [facility]) Assessment & Plan: BP Readings from Last 3 Encounters:  06/13/23 102/69  06/06/23 110/68  06/04/23 108/82  Relevant medications: Norvasc 10 mg daily, Hctz 25 mg daily, Losartan 50 mg daily, and Lopressor 12.5 mg twice daily  Condition managed by cardiology. During LOV with cardiologist, Lopressor dose was cut in  half. Pt reports occasionally her BP still runs low when monitoring at home. Encouraged pt to continue to f/up with specialist about coming off of any possible medications to stabilize BP. Continue regularly monitoring levels. Will continue to monitor alongside cardiologist.    Vitamin D deficiency Assessment & Plan: [name] is taking ERGO 50,000 units once weekly. Tolerating well, no SE. No concerns today in this regard. Will continue to monitor.   Follow up:   Return in about 2 weeks (around 06/27/2023). She was informed of the importance of frequent follow up visits to maximize her success with intensive lifestyle modifications for her multiple health conditions.  Subjective:   Chief complaint: Obesity [name] is here to discuss her progress with her obesity treatment plan. She is on the the Category 2 Plan and states she is following her eating plan approximately 100% of the time. She states she is walking 20 minutes 3 days per week.  Interval History:  [name] is here for a follow up office visit. Since last OV on 05/28/2023, [name] is down 7 lbs. She endorses eating salads and more chicken. Additionally, pt has removed mayonnaise from her sandwiches, but is still working on decreasing bread in her diet.   Pharmacotherapy for weight loss: She is currently taking Mounjaro 2.5 mg once weekly.   Review of Systems:  Pertinent positives were addressed with patient today.  Reviewed by clinician on day of visit: allergies, medications, problem list, medical history, surgical history, family history, social history, and previous encounter notes.  Weight Summary and Biometrics   Weight Lost Since Last Visit: 7  Weight Gained Since Last Visit: 0   Vitals Temp: 97.7 F (36.5 C) BP: 102/69 Pulse Rate: (!) 102 SpO2: 98 %   Anthropometric Measurements Height: 5\' 6"  (1.676 m) Weight: 210 lb 6.4 oz (95.4 kg) BMI (Calculated): 33.98 Weight at Last Visit: 217lb Weight Lost Since Last  Visit: 7 Weight Gained Since Last Visit: 0 Starting Weight: 217lb Total Weight Loss (lbs): 7 lb (3.175 kg) Peak Weight: 232lb   Body Composition  Body Fat %: 44.5 % Fat Mass (lbs): 93.6 lbs Muscle Mass (lbs): 110.8 lbs Total Body Water (lbs): 73.6 lbs Visceral Fat Rating : 12   Other Clinical Data Fasting: No Labs: No Today's Visit #: 3 Starting Date: 05/14/23    Objective:   PHYSICAL EXAM: Blood pressure 102/69, pulse (!) 102, temperature 97.7 F (36.5 C), height 5\' 6"  (1.676 m), weight 210 lb 6.4 oz (95.4 kg), SpO2 98%. Body mass index is 33.96 kg/m.  General: she is overweight, cooperative and in no acute distress. PSYCH: Has normal mood, affect and thought process.   HEENT: EOMI, sclerae are anicteric. Lungs: Normal breathing effort, no conversational dyspnea. Extremities: Moves * 4 Neurologic: A and O * 3, good insight  DIAGNOSTIC DATA REVIEWED: BMET    Component Value Date/Time   NA 139 05/14/2023 1057   K 4.3 05/14/2023 1057   CL 103 05/14/2023 1057   CO2 20 05/14/2023 1057   GLUCOSE 118 (H) 05/14/2023 1057   GLUCOSE 137 (H) 01/16/2023 0845   BUN 9 05/14/2023 1057   CREATININE 0.70 05/14/2023  1057   CREATININE 0.61 01/16/2023 0845   CALCIUM 9.2 05/14/2023 1057   GFRNONAA >60 07/27/2022 1400   GFRNONAA 106 02/16/2020 1144   GFRAA 123 02/16/2020 1144   Lab Results  Component Value Date   HGBA1C 6.7 (H) 05/14/2023   HGBA1C 5.2 05/19/2013   Lab Results  Component Value Date   INSULIN 17.8 05/14/2023   Lab Results  Component Value Date   TSH 1.890 05/14/2023   CBC    Component Value Date/Time   WBC 7.1 05/14/2023 1057   WBC 6.7 03/02/2023 0943   RBC 4.43 05/14/2023 1057   RBC 4.77 03/02/2023 0943   HGB 13.1 05/14/2023 1057   HCT 38.7 05/14/2023 1057   PLT 321 05/14/2023 1057   MCV 87 05/14/2023 1057   MCH 29.6 05/14/2023 1057   MCH 29.4 03/02/2023 0943   MCHC 33.9 05/14/2023 1057   MCHC 33.3 03/02/2023 0943   RDW 12.3 05/14/2023  1057   Iron Studies No results found for: "IRON", "TIBC", "FERRITIN", "IRONPCTSAT" Lipid Panel     Component Value Date/Time   CHOL 212 (H) 05/14/2023 1057   TRIG 134 05/14/2023 1057   HDL 59 05/14/2023 1057   CHOLHDL 3.6 05/14/2023 1057   CHOLHDL 3.5 01/16/2023 0845   VLDL 26 05/28/2019 0955   LDLCALC 129 (H) 05/14/2023 1057   LDLCALC 125 (H) 01/16/2023 0845   Hepatic Function Panel     Component Value Date/Time   PROT 6.7 05/14/2023 1057   ALBUMIN 4.3 05/14/2023 1057   AST 36 05/14/2023 1057   ALT 40 (H) 05/14/2023 1057   ALKPHOS 198 (H) 05/14/2023 1057   BILITOT 0.5 05/14/2023 1057   BILIDIR 0.1 03/02/2023 0943   IBILI 0.3 03/02/2023 0943      Component Value Date/Time   TSH 1.890 05/14/2023 1057   Nutritional Lab Results  Component Value Date   VD25OH 13.0 (L) 05/14/2023    Attestations:   I, Camryn Mix, acting as a Stage manager for [employer], DO., have compiled all relevant documentation for today's office visit on behalf of Marceil Sensor, DO, while in the presence of [employer], DO.  I have reviewed the above documentation for accuracy and completeness, and I agree with the above. [name], D.O.  The 21st Century Cures Act was signed into law in 2016 which includes the topic of electronic health records.  This provides immediate access to information in MyChart.  This includes consultation notes, operative notes, office notes, lab results and pathology reports.  If you have any questions about what you read please let us  know at your next visit so we can discuss your concerns and take corrective action if need be.  We are right here with you.

## 2023-06-20 ENCOUNTER — Other Ambulatory Visit (INDEPENDENT_AMBULATORY_CARE_PROVIDER_SITE_OTHER): Payer: Self-pay | Admitting: Family Medicine

## 2023-06-27 ENCOUNTER — Ambulatory Visit (INDEPENDENT_AMBULATORY_CARE_PROVIDER_SITE_OTHER): Admitting: Family Medicine

## 2023-06-27 ENCOUNTER — Encounter (INDEPENDENT_AMBULATORY_CARE_PROVIDER_SITE_OTHER): Payer: Self-pay | Admitting: Family Medicine

## 2023-06-27 VITALS — BP 97/66 | HR 90 | Temp 98.2°F | Ht 66.0 in | Wt 206.0 lb

## 2023-06-27 DIAGNOSIS — Z6841 Body Mass Index (BMI) 40.0 and over, adult: Secondary | ICD-10-CM

## 2023-06-27 DIAGNOSIS — E559 Vitamin D deficiency, unspecified: Secondary | ICD-10-CM

## 2023-06-27 DIAGNOSIS — E1159 Type 2 diabetes mellitus with other circulatory complications: Secondary | ICD-10-CM | POA: Diagnosis not present

## 2023-06-27 DIAGNOSIS — I152 Hypertension secondary to endocrine disorders: Secondary | ICD-10-CM

## 2023-06-27 DIAGNOSIS — Z7985 Long-term (current) use of injectable non-insulin antidiabetic drugs: Secondary | ICD-10-CM

## 2023-06-27 DIAGNOSIS — E1169 Type 2 diabetes mellitus with other specified complication: Secondary | ICD-10-CM | POA: Diagnosis not present

## 2023-06-27 DIAGNOSIS — Z6833 Body mass index (BMI) 33.0-33.9, adult: Secondary | ICD-10-CM

## 2023-06-27 DIAGNOSIS — K5903 Drug induced constipation: Secondary | ICD-10-CM | POA: Diagnosis not present

## 2023-06-27 MED ORDER — VITAMIN D (ERGOCALCIFEROL) 1.25 MG (50000 UNIT) PO CAPS: 50000.0000 [IU] | ORAL_CAPSULE | ORAL | 0 refills | Status: DC

## 2023-06-27 MED ORDER — POLYETHYLENE GLYCOL 3350 17 GM/SCOOP PO POWD: ORAL | 0 refills | Status: DC

## 2023-06-27 MED ORDER — SENNOSIDES-DOCUSATE SODIUM 8.6-50 MG PO TABS: 1.0000 | ORAL_TABLET | Freq: Every evening | ORAL | 1 refills | Status: DC | PRN

## 2023-06-27 NOTE — Progress Notes (Signed)
 [name], D.O.  ABFM, ABOM Specializing in Clinical Bariatric Medicine  Office located at: [address] [place], [place]  [address]   Assessment and Plan:  No orders of the defined types were placed in this encounter.  Medications Discontinued During This Encounter  Medication Reason   metoprolol  tartrate (LOPRESSOR ) 25 MG tablet Discontinued by provider   Vitamin D , Ergocalciferol , (DRISDOL ) 1.25 MG (50000 UNIT) CAPS capsule Reorder   polyethylene glycol powder (GLYCOLAX /MIRALAX ) 17 GM/SCOOP powder Reorder    Meds ordered this encounter  Medications   Vitamin D , Ergocalciferol , (DRISDOL ) 1.25 MG (50000 UNIT) CAPS capsule    Sig: Take 1 capsule (50,000 Units total) by mouth every 7 (seven) days.    Dispense:  4 capsule    Refill:  0   senna-docusate (SENOKOT-S) 8.6-50 MG tablet    Sig: Take 1 tablet by mouth at bedtime as needed for mild constipation. 1 po bid FOR CONSTIPATION    Dispense:  30 tablet    Refill:  1   polyethylene glycol powder (GLYCOLAX /MIRALAX ) 17 GM/SCOOP powder    Sig: BID until normal stool, Then 1 daily, then prn    Dispense:  510 g    Refill:  0      FOR THE DISEASE OF OBESITY:  BMI 30.0-39.9, adult - Current 33.27 Morbid obesity START BMI 35.04-DATE 05/14/23 Assessment & Plan: Since last office visit on 06/13/2023, patient's muscle mass has decreased by 1.8 lbs. Fat mass has decreased by 2.2 lbs. Total body water has increased by 1.4 lbs.  Counseling done on how various foods will affect these numbers and how to maximize success  Total lbs lost to date: 11 lbs Total weight loss percentage to date: 5.07%    Recommended Dietary Goals [name] is currently in the action stage of change. As such, her goal is to continue weight management plan.  She has agreed to: continue current plan   Behavioral Intervention We discussed the following today: decreasing simple carbohydrates , increasing water intake , work on managing stress,  creating time for self-care and relaxation, and continue to work on implementation of reduced calorie nutritional plan  Additional resources provided today: None  Evidence-based interventions for health behavior change were utilized today including the discussion of self monitoring techniques, problem-solving barriers and SMART goal setting techniques.   Regarding patient's less desirable eating habits and patterns, we employed the technique of small changes.   Pt will specifically work on: n/a   Recommended Physical Activity Goals [name] has been advised to work up to 300-450 minutes of moderate intensity aerobic activity a week and strengthening exercises 2-3 times per week for cardiovascular health, weight loss maintenance and preservation of muscle mass.   She has agreed to :  Continue current level of physical activity    Pharmacotherapy We both agreed to : continue with nutritional and behavioral strategies and current medication regimen   FOR ASSOCIATED CONDITIONS ADDRESSED TODAY:  Chronic constipation with worsening symptoms d/t Drug-induced constipation Assessment & Plan:  [name] is taking Miralax  twice daily for constipation. Condition likely worsened by Mounjaro medication. She endorses recently experiencing severe constipation. [name] is drinking about 80 oz of water daily. She attempted a suppository which only resulted in small amounts of stool but pt was still experiencing cramping. In the end, pt ended up doing an enema which helped her stool. Encouraged to exercise 10-15 mins daily, get to 100 oz of water, continue taking Miralax  twice daily, and take Pericolace twice  daily PRN for constipation.  Pt will call insurance company to see if they cover Linzess prescription for additional relief with constipation. Will continue to monitor.   Relevant Orders: -     Sennosides-Docusate Sodium ; Take 1 tablet by mouth at bedtime as needed for mild constipation. 1 po bid FOR CONSTIPATION   Dispense: 30 tablet; Refill: 1 -     Polyethylene Glycol 3350 ; BID until normal stool, Then 1 daily, then prn  Dispense: 510 g; Refill: 0   Type 2 diabetes mellitus with obesity (HCC) Assessment & Plan: [name] is taking Mounjaro 2.5 mg once weekly. She has not increased dose to 5 mg yet because she prefers to finish 2.5 mg dose. Pt is tolerating medication well, denies any N/V/D but experiencing constipation. Continue 2.5 mg dose until increase to 5 mg. Pt is aware that increasing dose can increase severity of constipation. Continue following high protein, low carb MP. Will monitor condition closely.    Vitamin D  deficiency Assessment & Plan: Pt is taking ERGO 50,000 units once weekly. Denies any N/V or muscle weakness. Continue current supplementation regimen. Will refill today.   Relevant Orders: -     Vitamin D  (Ergocalciferol ); Take 1 capsule (50,000 Units total) by mouth every 7 (seven) days.  Dispense: 4 capsule; Refill: 0   Hypertension associated with diabetes [facility]) Assessment & Plan: BP Readings from Last 3 Encounters:  06/27/23 97/66  06/13/23 102/69  06/06/23 110/68  Pt is taking Norvasc  10 mg daily, Hctz 25 mg daily, and Losartan  50 mg daily. Cardiologist Dr. Avanell Bob titrated pt off of Lopressor . She has been off this medication for a week. Secondary to diabetes, I do not recommend decreasing Cozaar  but decreasing HCTZ or Norvasc . Will defer to cardiologist for further consultation.   Follow up:   Return in about 3 weeks (around 07/18/2023). She was informed of the importance of frequent follow up visits to maximize her success with intensive lifestyle modifications for her multiple health conditions.  Subjective:   Chief complaint: Obesity [name] is here to discuss her progress with her obesity treatment plan. She is on the the Category 2 Plan and states she is following her eating plan approximately 90% of the time. She states she is walking 20 minutes 3 days per  week.  Interval History:  [name] is here for a follow up office visit. Since last OV on 06/13/2023, pt is down 4 lbs. She endorses sometimes not being able to get in all of her foods and feels full. However, she is trying to focus on protein when eating. Additionally, [name] is experiencing constipation.   Pharmacotherapy for weight loss: She is currently taking Mounjaro 2.5 mg once weekly.   Review of Systems:  Pertinent positives were addressed with patient today.  Reviewed by clinician on day of visit: allergies, medications, problem list, medical history, surgical history, family history, social history, and previous encounter notes.  Weight Summary and Biometrics   Weight Lost Since Last Visit: 4lb  Weight Gained Since Last Visit: 0lb    Vitals Temp: 98.2 F (36.8 C) BP: 97/66 Pulse Rate: 90 SpO2: 98 %   Anthropometric Measurements Height: 5\' 6"  (1.676 m) Weight: 206 lb (93.4 kg) BMI (Calculated): 33.27 Weight at Last Visit: 210lb Weight Lost Since Last Visit: 4lb Weight Gained Since Last Visit: 0lb Starting Weight: 217lb Total Weight Loss (lbs): 11 lb (4.99 kg) Peak Weight: 232lb   Body Composition  Body Fat %: 44.3 % Fat Mass (lbs): 91.4 lbs  Muscle Mass (lbs): 109 lbs Total Body Water (lbs): 75 lbs Visceral Fat Rating : 11   Other Clinical Data Fasting: No Labs: No Today's Visit #: 4 Starting Date: 05/14/23    Objective:   PHYSICAL EXAM: Blood pressure 97/66, pulse 90, temperature 98.2 F (36.8 C), height 5\' 6"  (1.676 m), weight 206 lb (93.4 kg), SpO2 98%. Body mass index is 33.25 kg/m.  General: she is overweight, cooperative and in no acute distress. PSYCH: Has normal mood, affect and thought process.   HEENT: EOMI, sclerae are anicteric. Lungs: Normal breathing effort, no conversational dyspnea. Extremities: Moves * 4 Neurologic: A and O * 3, good insight  DIAGNOSTIC DATA REVIEWED: BMET    Component Value Date/Time   NA 139  05/14/2023 1057   K 4.3 05/14/2023 1057   CL 103 05/14/2023 1057   CO2 20 05/14/2023 1057   GLUCOSE 118 (H) 05/14/2023 1057   GLUCOSE 137 (H) 01/16/2023 0845   BUN 9 05/14/2023 1057   CREATININE 0.70 05/14/2023 1057   CREATININE 0.61 01/16/2023 0845   CALCIUM  9.2 05/14/2023 1057   GFRNONAA >60 07/27/2022 1400   GFRNONAA 106 02/16/2020 1144   GFRAA 123 02/16/2020 1144   Lab Results  Component Value Date   HGBA1C 6.7 (H) 05/14/2023   HGBA1C 5.2 05/19/2013   Lab Results  Component Value Date   INSULIN  17.8 05/14/2023   Lab Results  Component Value Date   TSH 1.890 05/14/2023   CBC    Component Value Date/Time   WBC 7.1 05/14/2023 1057   WBC 6.7 03/02/2023 0943   RBC 4.43 05/14/2023 1057   RBC 4.77 03/02/2023 0943   HGB 13.1 05/14/2023 1057   HCT 38.7 05/14/2023 1057   PLT 321 05/14/2023 1057   MCV 87 05/14/2023 1057   MCH 29.6 05/14/2023 1057   MCH 29.4 03/02/2023 0943   MCHC 33.9 05/14/2023 1057   MCHC 33.3 03/02/2023 0943   RDW 12.3 05/14/2023 1057   Iron Studies No results found for: "IRON", "TIBC", "FERRITIN", "IRONPCTSAT" Lipid Panel     Component Value Date/Time   CHOL 212 (H) 05/14/2023 1057   TRIG 134 05/14/2023 1057   HDL 59 05/14/2023 1057   CHOLHDL 3.6 05/14/2023 1057   CHOLHDL 3.5 01/16/2023 0845   VLDL 26 05/28/2019 0955   LDLCALC 129 (H) 05/14/2023 1057   LDLCALC 125 (H) 01/16/2023 0845   Hepatic Function Panel     Component Value Date/Time   PROT 6.8 06/25/2023 0821   ALBUMIN 4.2 06/25/2023 0821   AST 22 06/25/2023 0821   ALT 25 06/25/2023 0821   ALKPHOS 168 (H) 06/25/2023 0821   BILITOT 0.4 06/25/2023 0821   BILIDIR 0.16 06/25/2023 0821   IBILI 0.3 03/02/2023 0943      Component Value Date/Time   TSH 1.890 05/14/2023 1057   Nutritional Lab Results  Component Value Date   VD25OH 13.0 (L) 05/14/2023    Attestations:   I, Camryn Mix, acting as a Stage manager for [employer], DO., have compiled all relevant documentation  for today's office visit on behalf of [name] Sensor, DO, while in the presence of [employer], DO.  I have reviewed the above documentation for accuracy and completeness, and I agree with the above. [name], D.O.  The 21st Century Cures Act was signed into law in 2016 which includes the topic of electronic health records.  This provides immediate access to information in MyChart.  This includes consultation notes, operative notes, office notes,  lab results and pathology reports.  If you have any questions about what you read please let us  know at your next visit so we can discuss your concerns and take corrective action if need be.  We are right here with you.

## 2023-07-05 ENCOUNTER — Other Ambulatory Visit: Payer: Self-pay | Admitting: *Deleted

## 2023-07-05 LAB — CELIAC DISEASE HLA DQ ASSOC.

## 2023-07-05 LAB — PRIMARY BILIARY CHOLANGITIS PR
Anti-GP-210 Ab (RDL): <20 U (ref <20)
Anti-Mitochondrial M2 Ab (RDL): <20 U (ref <20)
Anti-Nuclear Ab by IFA (RDL): POSITIVE — AB
Anti-SP-100 Ab (RDL): <20 U (ref <20)

## 2023-07-05 LAB — HEPATIC FUNCTION PANEL
ALT: 25 IU/L (ref 0–32)
AST: 22 IU/L (ref 0–40)
Albumin: 4.2 g/dL (ref 3.8–4.9)
Alkaline Phosphatase: 168 IU/L — ABNORMAL HIGH (ref 44–121)
Bilirubin Total: 0.4 mg/dL (ref 0.0–1.2)
Bilirubin, Direct: 0.16 mg/dL (ref 0.00–0.40)
Total Protein: 6.8 g/dL (ref 6.0–8.5)

## 2023-07-05 LAB — ANA TITER AND PATTERN: Homogeneous Pattern: 1:160 {titer} — ABNORMAL HIGH

## 2023-07-05 MED ORDER — ZONISAMIDE 100 MG PO CAPS: ORAL_CAPSULE | ORAL | 1 refills | Status: DC

## 2023-07-05 NOTE — Telephone Encounter (Signed)
 Last seen on 11/28/22 Follow up scheduled on 12/04/23

## 2023-07-06 ENCOUNTER — Other Ambulatory Visit: Payer: Self-pay

## 2023-07-06 DIAGNOSIS — K760 Fatty (change of) liver, not elsewhere classified: Secondary | ICD-10-CM

## 2023-07-06 DIAGNOSIS — R7989 Other specified abnormal findings of blood chemistry: Secondary | ICD-10-CM

## 2023-07-06 DIAGNOSIS — R768 Other specified abnormal immunological findings in serum: Secondary | ICD-10-CM

## 2023-07-11 ENCOUNTER — Ambulatory Visit

## 2023-07-18 ENCOUNTER — Encounter (INDEPENDENT_AMBULATORY_CARE_PROVIDER_SITE_OTHER): Payer: Self-pay | Admitting: Family Medicine

## 2023-07-18 ENCOUNTER — Ambulatory Visit (INDEPENDENT_AMBULATORY_CARE_PROVIDER_SITE_OTHER): Admitting: Family Medicine

## 2023-07-18 VITALS — BP 105/76 | HR 85 | Temp 98.3°F | Ht 66.0 in | Wt 207.0 lb

## 2023-07-18 DIAGNOSIS — I152 Hypertension secondary to endocrine disorders: Secondary | ICD-10-CM | POA: Diagnosis not present

## 2023-07-18 DIAGNOSIS — E1169 Type 2 diabetes mellitus with other specified complication: Secondary | ICD-10-CM | POA: Diagnosis not present

## 2023-07-18 DIAGNOSIS — E1159 Type 2 diabetes mellitus with other circulatory complications: Secondary | ICD-10-CM | POA: Diagnosis not present

## 2023-07-18 DIAGNOSIS — E559 Vitamin D deficiency, unspecified: Secondary | ICD-10-CM

## 2023-07-18 DIAGNOSIS — Z7985 Long-term (current) use of injectable non-insulin antidiabetic drugs: Secondary | ICD-10-CM

## 2023-07-18 DIAGNOSIS — Z6833 Body mass index (BMI) 33.0-33.9, adult: Secondary | ICD-10-CM

## 2023-07-18 DIAGNOSIS — Z6841 Body Mass Index (BMI) 40.0 and over, adult: Secondary | ICD-10-CM

## 2023-07-18 MED ORDER — TIRZEPATIDE 5 MG/0.5ML ~~LOC~~ SOAJ: 5.0000 mg | SUBCUTANEOUS | 0 refills | Status: DC

## 2023-07-18 MED ORDER — VITAMIN D (ERGOCALCIFEROL) 1.25 MG (50000 UNIT) PO CAPS: 50000.0000 [IU] | ORAL_CAPSULE | ORAL | 1 refills | Status: DC

## 2023-07-18 NOTE — Progress Notes (Signed)
 [name], D.O.  ABFM, ABOM Specializing in Clinical Bariatric Medicine  Office located at: [address] [place], [place]  [address]   Assessment and Plan:  No orders of the defined types were placed in this encounter.  There are no discontinued medications.   Meds ordered this encounter  Medications   Vitamin D , Ergocalciferol , (DRISDOL ) 1.25 MG (50000 UNIT) CAPS capsule    Sig: Take 1 capsule (50,000 Units total) by mouth every 7 (seven) days.    Dispense:  4 capsule    Refill:  1   tirzepatide (MOUNJARO) 5 MG/0.5ML Pen    Sig: Inject 5 mg into the skin once a week.    Dispense:  2 mL    Refill:  0     FOR THE DISEASE OF OBESITY: Morbid obesity START BMI 35.04-DATE 05/14/23 BMI 30.0-39.9, adult - Current 33.43 Assessment & Plan: Since last office visit on 06/27/23 patient's muscle mass has increased by 1 lb. Fat mass and total body water weight was maintained.  Counseling done on how various foods will affect these numbers and how to maximize success  Total lbs lost to date: 10 lbs Total weight loss percentage to date: -4.61%    Recommended Dietary Goals [name] is currently in the action stage of change. As such, her goal is to continue weight management plan.  She has agreed to: continue current plan   Behavioral Intervention We discussed the following today: increasing lean protein intake to established goals, decreasing simple carbohydrates , increasing lower glycemic fruits, avoiding skipping meals, increasing water intake , keeping healthy foods at home, decreasing eating out or consumption of processed foods, and making healthy choices when eating convenient foods, practice mindfulness eating and understand the difference between hunger signals and cravings, and avoiding temptations and identifying enticing environmental cues  Additional resources provided today: None  Evidence-based interventions for health behavior change were utilized today  including the discussion of self monitoring techniques, problem-solving barriers and SMART goal setting techniques.   Regarding patient's less desirable eating habits and patterns, we employed the technique of small changes.   Pt will specifically work on: consistently stay on plan for next visit.    Recommended Physical Activity Goals [name] has been advised to work up to 300-450 minutes of moderate intensity aerobic activity a week and strengthening exercises 2-3 times per week for cardiovascular health, weight loss maintenance and preservation of muscle mass.   She has agreed to :  Think about enjoyable ways to increase daily physical activity and overcoming barriers to exercise, Increase physical activity in their day and reduce sedentary time (increase NEAT)., and continue to gradually increase the amount and intensity of exercise routine   Pharmacotherapy We both agreed to: continue with nutritional and behavioral strategies   ASSOCIATED CONDITIONS ADDRESSED TODAY: Type 2 diabetes mellitus with obesity [facility]) Assessment & Plan: Lab Results  Component Value Date   HGBA1C 6.7 (H) 05/14/2023   HGBA1C 6.6 (H) 01/16/2023   HGBA1C 6.2 (H) 07/27/2022   INSULIN  17.8 05/14/2023    Pt is on Mounjaro 5 mg once daily; started 2 weeks ago. Tolerating well with no adverse SE reported. Since increasing her Mounjaro to current dose, her constipation has improved and is overall well managed with Senokot, Miralax  and Colace. Denies any current GI issues today. Has increased her water intake to about 72 ounces of water daily. Given that pt has not been consistently eating on-plan, I do not recommend she increase Mounjaro to  7.5 mg at this time. Will consider this increase in the future, after pt has been continuously eating on-plan. Will continue on current med regimen and refill Mounjaro today. Continue working on staying on plan and increasing exercise. Increase water intake to at least half her body  weight in ounces of water.  Orders: - Refill Mounjaro, no dose change   Hypertension associated with diabetes [facility]) Assessment & Plan: BP Readings from Last 3 Encounters:  07/18/23 105/76  06/27/23 97/66  06/13/23 102/69   BP readings have in low-normal range. Pt is on Amlodipine  10 mg once daily, Losartan  50 mg once daily, and HCTZ to 12.5 mg once daily. She recently reduced her HCTZ to half tablets at the recommendation of Dr. Cheril Cork. Reviewed how blood pressures are expected to decrease with weight loss. I recommend she discuss the possibility of reducing antihypertensives with her cardiology care team. Continue working on prioritizing heart healthy diet via her meal plan and increasing exercise.    Vitamin D  deficiency Assessment & Plan: Lab Results  Component Value Date   VD25OH 13.0 (L) 05/14/2023   Pt is on ERGO 50K units once weekly. Tolerating Tolerating well with no side effects. No acute concerns. Continue with current supplementation regimen, refill ERGO today. Will recheck vit D levels periodically.   Orders: - Refill ERGO, no changes   Follow up:   Return in about 5 weeks upcoming appt on 08/22/23, make another appt if pt desires. She was informed of the importance of frequent follow up visits to maximize her success with intensive lifestyle modifications for her multiple health conditions.  Subjective:   Chief complaint: Obesity [name] is here to discuss her progress with her obesity treatment plan. She is on the Category 2 Plan and states she is following her eating plan approximately 75% of the time. She states she is walking an undisclosed amount of time 3 days per week.  Interval History:  [name] is here for a follow up office visit. Since last OV on 06/27/23, she gained 1 lb. Reports improved self-control when eating sweets, stating if she eats sweets she is at the point where she doesn't over eat them. When eating out last night she tried to drink a  regular Coke with dinner and states it "tasted awful" and could not finish it. Struggles with eating breakfast, but not on weekends. Tends to skip lunch.   Pharmacotherapy for weight loss: She is currently taking Monjauro with diabetes as the primary indication with adequate clinical response  and without side effects..   Review of Systems:  Pertinent positives were addressed with patient today.  Reviewed by clinician on day of visit: allergies, medications, problem list, medical history, surgical history, family history, social history, and previous encounter notes.  Weight Summary and Biometrics   Weight Lost Since Last Visit: 0  Weight Gained Since Last Visit: 1lb    Vitals Temp: 98.3 F (36.8 C) BP: 105/76 Pulse Rate: 85 SpO2: 99 %   Anthropometric Measurements Height: 5\' 6"  (1.676 m) Weight: 207 lb (93.9 kg) BMI (Calculated): 33.43 Weight at Last Visit: 206lb Weight Lost Since Last Visit: 0 Weight Gained Since Last Visit: 1lb Starting Weight: 217lb Total Weight Loss (lbs): 10 lb (4.536 kg) Peak Weight: 232lb   Body Composition  Body Fat %: 44.1 % Fat Mass (lbs): 91.4 lbs Muscle Mass (lbs): 110 lbs Total Body Water (lbs): 75 lbs Visceral Fat Rating : 11   Other Clinical Data Fasting: no Labs: no Today's  Visit #: 5 Starting Date: 05/14/23    Objective:   PHYSICAL EXAM: Blood pressure 105/76, pulse 85, temperature 98.3 F (36.8 C), height 5\' 6"  (1.676 m), weight 207 lb (93.9 kg), SpO2 99%. Body mass index is 33.41 kg/m.  General: she is overweight, cooperative and in no acute distress. PSYCH: Has normal mood, affect and thought process.   HEENT: EOMI, sclerae are anicteric. Lungs: Normal breathing effort, no conversational dyspnea. Extremities: Moves * 4 Neurologic: A and O * 3, good insight  DIAGNOSTIC DATA REVIEWED: BMET    Component Value Date/Time   NA 139 05/14/2023 1057   K 4.3 05/14/2023 1057   CL 103 05/14/2023 1057   CO2 20  05/14/2023 1057   GLUCOSE 118 (H) 05/14/2023 1057   GLUCOSE 137 (H) 01/16/2023 0845   BUN 9 05/14/2023 1057   CREATININE 0.70 05/14/2023 1057   CREATININE 0.61 01/16/2023 0845   CALCIUM  9.2 05/14/2023 1057   GFRNONAA >60 07/27/2022 1400   GFRNONAA 106 02/16/2020 1144   GFRAA 123 02/16/2020 1144   Lab Results  Component Value Date   HGBA1C 6.7 (H) 05/14/2023   HGBA1C 5.2 05/19/2013   Lab Results  Component Value Date   INSULIN  17.8 05/14/2023   Lab Results  Component Value Date   TSH 1.890 05/14/2023   CBC    Component Value Date/Time   WBC 7.1 05/14/2023 1057   WBC 6.7 03/02/2023 0943   RBC 4.43 05/14/2023 1057   RBC 4.77 03/02/2023 0943   HGB 13.1 05/14/2023 1057   HCT 38.7 05/14/2023 1057   PLT 321 05/14/2023 1057   MCV 87 05/14/2023 1057   MCH 29.6 05/14/2023 1057   MCH 29.4 03/02/2023 0943   MCHC 33.9 05/14/2023 1057   MCHC 33.3 03/02/2023 0943   RDW 12.3 05/14/2023 1057   Iron Studies No results found for: "IRON", "TIBC", "FERRITIN", "IRONPCTSAT" Lipid Panel     Component Value Date/Time   CHOL 212 (H) 05/14/2023 1057   TRIG 134 05/14/2023 1057   HDL 59 05/14/2023 1057   CHOLHDL 3.6 05/14/2023 1057   CHOLHDL 3.5 01/16/2023 0845   VLDL 26 05/28/2019 0955   LDLCALC 129 (H) 05/14/2023 1057   LDLCALC 125 (H) 01/16/2023 0845   Hepatic Function Panel     Component Value Date/Time   PROT 6.8 06/25/2023 0821   ALBUMIN 4.2 06/25/2023 0821   AST 22 06/25/2023 0821   ALT 25 06/25/2023 0821   ALKPHOS 168 (H) 06/25/2023 0821   BILITOT 0.4 06/25/2023 0821   BILIDIR 0.16 06/25/2023 0821   IBILI 0.3 03/02/2023 0943      Component Value Date/Time   TSH 1.890 05/14/2023 1057   Nutritional Lab Results  Component Value Date   VD25OH 13.0 (L) 05/14/2023    Attestations:   Cherryl Corona, acting as a medical scribe for Marceil Sensor, DO., have compiled all relevant documentation for today's office visit on behalf of Marceil Sensor, DO, while in the  presence of [employer], DO.  Reviewed by clinician on day of visit: allergies, medications, problem list, medical history, surgical history, family history, social history, and previous encounter notes pertinent to patient's obesity diagnosis.   I have reviewed the above documentation for accuracy and completeness, and I agree with the above. [name], D.O.  The 21st Century Cures Act was signed into law in 2016 which includes the topic of electronic health records.  This provides immediate access to information in MyChart.  This includes consultation notes,  operative notes, office notes, lab results and pathology reports.  If you have any questions about what you read please let us  know at your next visit so we can discuss your concerns and take corrective action if need be.  We are right here with you.

## 2023-08-01 ENCOUNTER — Other Ambulatory Visit: Payer: Self-pay | Admitting: Family Medicine

## 2023-08-01 DIAGNOSIS — M722 Plantar fascial fibromatosis: Secondary | ICD-10-CM

## 2023-08-01 DIAGNOSIS — M79672 Pain in left foot: Secondary | ICD-10-CM

## 2023-08-03 ENCOUNTER — Encounter: Payer: Medicare Other | Admitting: Internal Medicine

## 2023-08-08 ENCOUNTER — Other Ambulatory Visit: Payer: Self-pay

## 2023-08-08 ENCOUNTER — Ambulatory Visit

## 2023-08-08 VITALS — Ht 66.0 in | Wt 207.0 lb

## 2023-08-08 DIAGNOSIS — Z0000 Encounter for general adult medical examination without abnormal findings: Secondary | ICD-10-CM

## 2023-08-08 NOTE — Telephone Encounter (Signed)
 Patient seen for AWV and is asking if refill of Oracea  can be sent to pharmacy until she is able to get back in with dermatology.

## 2023-08-08 NOTE — Progress Notes (Signed)
 Subjective:   [name] is a 58 y.o. who presents for a Medicare Wellness preventive visit.  As a reminder, Annual Wellness Visits don't include a physical exam, and some assessments may be limited, especially if this visit is performed virtually. We may recommend an in-person follow-up visit with your provider if needed.  Visit Complete: Virtual I connected with  [name] on 08/08/23 by a audio enabled telemedicine application and verified that I am speaking with the correct person using two identifiers.  Patient Location: Home  Provider Location: Home Office  I discussed the limitations of evaluation and management by telemedicine. The patient expressed understanding and agreed to proceed.  Vital Signs: Because this visit was a virtual/telehealth visit, some criteria may be missing or patient reported. Any vitals not documented were not able to be obtained and vitals that have been documented are patient reported.  VideoDeclined- This patient declined Librarian, academic. Therefore the visit was completed with audio only.  Persons Participating in Visit: Patient.  AWV Questionnaire: No: Patient Medicare AWV questionnaire was not completed prior to this visit.  Cardiac Risk Factors include: diabetes mellitus;hypertension;sedentary lifestyle     Objective:     Today's Vitals   08/08/23 1453  Weight: 207 lb (93.9 kg)  Height: 5\' 6"  (1.676 m)   Body mass index is 33.41 kg/m.     08/08/2023    2:58 PM 09/29/2022   10:50 AM 07/27/2022    1:32 PM 07/19/2022    6:27 AM 07/20/2021    8:47 AM 12/31/2020    8:32 AM 04/01/2020    1:45 PM  Advanced Directives  Does Patient Have a Medical Advance Directive? No No No No No No No  Would patient like information on creating a medical advance directive? Yes (MAU/Ambulatory/Procedural Areas - Information given) No - Patient declined No - Patient declined No - Patient declined No - Patient declined No -  Patient declined     Current Medications (verified) Outpatient Encounter Medications as of 08/08/2023  Medication Sig   ACCU-CHEK GUIDE test strip USE AS DIRECTED TO MONITOR FSBS 1X DAILY. DX: R73.09.   amLODipine  (NORVASC ) 10 MG tablet Take 1 tablet (10 mg total) by mouth daily.   b complex vitamins capsule With 500mcg b12 daily   Blood Glucose Monitoring Suppl (BLOOD GLUCOSE SYSTEM PAK) KIT Please dispense based on patient and insurance preference. Use as directed to monitor FSBS 1x daily. Dx: R73.09.   estradiol  (VIVELLE -DOT) 0.1 MG/24HR patch Place 1 patch (0.1 mg total) onto the skin 2 (two) times a week.   fluconazole  (DIFLUCAN ) 150 MG tablet Take 1 now and 1 in 3 days, do not with Crestor    gabapentin  (NEURONTIN ) 300 MG capsule Take 300 mg by mouth 3 (three) times daily.   hydrochlorothiazide  (HYDRODIURIL ) 25 MG tablet Take 1 tablet (25 mg total) by mouth daily.   HYDROcodone -acetaminophen  (NORCO) 10-325 MG tablet Take 1 tablet by mouth every 4 (four) hours as needed for severe pain.   Lancets MISC Please dispense based on patient and insurance preference. Use as directed to monitor FSBS 1x daily. Dx: R73.09.   losartan  (COZAAR ) 50 MG tablet Take 1 tablet (50 mg total) by mouth daily.   nystatin -triamcinolone  ointment (MYCOLOG) Use bid for 2 weeks then stop can repeat if necessary   ondansetron  (ZOFRAN ) 4 MG tablet TAKE 1 TABLET BY MOUTH EVERY 8 HOURS AS NEEDED FOR NAUSEA AND VOMITING   ORACEA  40 MG CPDR Take 40 mg  by mouth daily.   polyethylene glycol powder (GLYCOLAX /MIRALAX ) 17 GM/SCOOP powder BID until normal stool, Then 1 daily, then prn   rosuvastatin  (CRESTOR ) 20 MG tablet Take 1 tablet (20 mg total) by mouth daily.   senna-docusate (SENOKOT-S) 8.6-50 MG tablet Take 1 tablet by mouth at bedtime as needed for mild constipation. 1 po bid FOR CONSTIPATION   tirzepatide (MOUNJARO) 5 MG/0.5ML Pen Inject 5 mg into the skin once a week.   venlafaxine  XR (EFFEXOR -XR) 150 MG 24 hr capsule  Take 2 capsules (300 mg total) by mouth daily with breakfast.   Vitamin D , Ergocalciferol , (DRISDOL ) 1.25 MG (50000 UNIT) CAPS capsule Take 1 capsule (50,000 Units total) by mouth every 7 (seven) days.   zonisamide  (ZONEGRAN ) 100 MG capsule Take 200mg  daily at bedtime   No facility-administered encounter medications on file as of 08/08/2023.    Allergies (verified) Penicillins, Toradol  [ketorolac  tromethamine ], Hydromorphone , and Lamotrigine   History: Past Medical History:  Diagnosis Date   Anxiety    Anxiety    Back pain    Bipolar 1 disorder (HCC)    Bipolar disorder (HCC)    Chest pain    Constipation    Depression    Depression    Diabetes (HCC)    Diabetes mellitus without complication (HCC)    Type 2   Dyspnea    r/t anxiety   Encounter for neuropsychological testing    at [facility] 3/20-suggest possible somatization   Fatty liver    GERD (gastroesophageal reflux disease)    Headache    High blood pressure    High cholesterol    Hypertension    Joint pain    Migraines    Pseudoseizure    Last seizure March/April 2024   PTSD (post-traumatic stress disorder)    Rheumatoid arthritis (HCC)    Rosacea    Seizures (HCC)    [facility] (Dr. 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 1990s   ANTERIOR LAT LUMBAR FUSION N/A 08/03/2022   Procedure: Lumbar two-three extreme Lateral Interbody Fusion;  Surgeon: Elna Haggis, MD;  Location: [facility] OR;  Service: Neurosurgery;  Laterality: N/A;   APPENDECTOMY     BACK SURGERY     x2   CHOLECYSTECTOMY     COLONOSCOPY WITH PROPOFOL  N/A 07/20/2021   Surgeon: Suzette Espy, MD;  Three 6-8 mm polyps in the descending colon and cecum removed,  diverticulosis in the entire colon, otherwise normal exam.  Pathology with tubular adenomas.  Recommended 5-year surveillance.   CYST EXCISION     on ovaries   lumbar     ruptured disc in lumbar taken out   POLYPECTOMY  07/20/2021   Procedure: POLYPECTOMY;  Surgeon: Suzette Espy, MD;  Location: [facility] SUITE;  Service: Endoscopy;;   ROTATOR CUFF REPAIR Left    TONSILLECTOMY     TUBAL LIGATION     Family History  Problem Relation Age of Onset   Obesity Father    Hyperlipidemia Father    Diabetes Father    Heart disease Father 58   Hypertension Father    Sleep apnea Father    Breast cancer Paternal Aunt        ?  age of dx   Cancer Maternal Grandfather        colon cancer (70's)   Diabetes Paternal Grandmother    Diabetes Paternal Grandfather    Cancer Cousin        breast   Breast cancer Cousin    Breast cancer Cousin        paternal   Breast cancer Cousin    Liver cancer Neg Hx    Social History   Socioeconomic History   Marital status: Married    Spouse name: Not on file   Number of children: 2   Years of education: Associates    Highest education level: Not on file  Occupational History   Not on file  Tobacco Use   Smoking status: Former    Current packs/day: 0.00    Average packs/day: 0.5 packs/day for 10.0 years (5.0 ttl pk-yrs)    Types: Cigarettes    Start date: 05/14/1981    Quit date: 05/15/1991    Years since quitting: 32.2   Smokeless tobacco: Never  Vaping Use   Vaping status: Never Used  Substance and Sexual Activity   Alcohol use: Yes    Comment: socially   Drug use: No   Sexual activity: Yes    Birth control/protection: Surgical    Comment: hyst  Other Topics Concern   Not on file  Social History Narrative   Caffeine  use: tea sometimes, soda sometimes   Right handed   Married x 5 years 01/27/21   1 son and 1 daughter. 5 grandchildren.    Social Drivers of Corporate investment banker Strain: Low Risk  (08/08/2023)   Overall Financial  Resource Strain (CARDIA)    Difficulty of Paying Living Expenses: Not hard at all  Food Insecurity: No Food Insecurity (08/08/2023)   Hunger Vital Sign    Worried About Running Out of Food in the Last Year: Never true    Ran Out of Food in the Last Year: Never true  Transportation Needs: No Transportation Needs (08/08/2023)   PRAPARE - Administrator, Civil Service (Medical): No    Lack of Transportation (Non-Medical): No  Physical Activity: Inactive (08/08/2023)   Exercise Vital Sign    Days of Exercise per Week: 0 days    Minutes of Exercise per Session: 0 min  Stress: Stress Concern Present (08/08/2023)   [employer] of Occupational Health - Occupational Stress Questionnaire    Feeling of Stress : To some extent  Social Connections: Moderately Integrated (08/08/2023)   Social Connection and Isolation Panel [NHANES]    Frequency of Communication with Friends and Family: More than three times a week    Frequency of Social Gatherings with Friends and Family: Once a week    Attends Religious Services: More than 4 times per year    Active Member of [employer] or Organizations: No    Attends Banker Meetings: Never    Marital Status: Married    Tobacco Counseling Counseling given: Not Answered    Clinical Intake:  Pre-visit preparation completed: Yes  Pain : No/denies pain  Diabetes: Yes CBG done?: No Did pt. bring in CBG monitor from home?: No  Lab Results  Component Value Date   HGBA1C 6.7 (H) 05/14/2023   HGBA1C 6.6 (H) 01/16/2023   HGBA1C 6.2 (H) 07/27/2022     How often do you need to have someone help you when you read instructions, pamphlets, or other written materials from your doctor or pharmacy?:  1 - Never  Interpreter Needed?: No  Information entered by :: Seabron [name] LPN   Activities of Daily Living     08/08/2023    2:57 PM  In your present state of health, do you have any difficulty performing the following activities:  Hearing?  0  Vision? 0  Difficulty concentrating or making decisions? 0  Walking or climbing stairs? 0  Dressing or bathing? 0  Doing errands, shopping? 0  Preparing Food and eating ? N  Using the Toilet? N  In the past six months, have you accidently leaked urine? N  Do you have problems with loss of bowel control? N  Managing your Medications? N  Managing your Finances? N  Housekeeping or managing your Housekeeping? N    Patient Care Team: Austine Lefort, MD as PCP - General (Family Medicine) Riley Cheadle Windsor Hatcher, MD as Consulting Physician (Gastroenterology) Katherin Pam, MD as Referring Physician (Specialist) [place], [facility] Ricky Charter, [place] Dales, NP as Nurse Practitioner (Obstetrics and Gynecology) Hines Ludwig, MD as Consulting Physician (Rheumatology) Terrilyn Fick, NP as Nurse Practitioner (Neurology)  I have updated your Care Teams any recent Medical Services you may have received from other providers in the past year.     Assessment:    This is a routine wellness examination for [name].  Hearing/Vision screen Hearing Screening - Comments:: Denies hearing difficulties   Vision Screening - Comments:: up to date with routine eye exams with [facility]    Goals Addressed             This Visit's Progress    Maintain health and independence   On track      Depression Screen     08/08/2023    2:56 PM 05/22/2023    3:56 PM 01/16/2023    8:20 AM 12/06/2022   10:34 AM 04/18/2022    4:16 PM 03/30/2022   11:09 AM 03/30/2022   10:55 AM  PHQ 2/9 Scores  PHQ - 2 Score 4 0 0 2 0 0 0  PHQ- 9 Score 10 7  9  0 0 0    Fall Risk     08/08/2023    2:57 PM 05/22/2023    3:56 PM 01/16/2023    8:20 AM 12/06/2022   10:44 AM 04/18/2022    4:16 PM  Fall Risk   Falls in the past year? 0 0 0 0 0  Number falls in past yr: 0 0 0 0 0  Injury with Fall? 0 0 0 0 0  Risk for fall due to : No Fall Risks  No Fall Risks  No Fall Risks  Follow up Falls prevention  discussed;Education provided;Falls evaluation completed  Falls prevention discussed  Falls prevention discussed    MEDICARE RISK AT HOME:  Medicare Risk at Home Any stairs in or around the home?: Yes If so, are there any without handrails?: No Home free of loose throw rugs in walkways, pet beds, electrical cords, etc?: Yes Adequate lighting in your home to reduce risk of falls?: Yes Life alert?: No Use of a cane, walker or w/c?: No Grab bars in the bathroom?: Yes Shower chair or bench in shower?: No Elevated toilet seat or a handicapped toilet?: Yes  TIMED UP AND GO:  Was the test performed?  No  Cognitive Function: 6CIT completed        08/08/2023    2:57 PM 02/10/2022    9:27 AM 02/10/2022    8:58 AM  12/31/2020    8:38 AM  6CIT Screen  What Year? 0 points 0 points 0 points 0 points  What month? 0 points 0 points 0 points 0 points  What time? 0 points 0 points 0 points 0 points  Count back from 20 0 points 0 points 0 points 0 points  Months in reverse 2 points 0 points 0 points 0 points  Repeat phrase 0 points 0 points 0 points 0 points  Total Score 2 points 0 points 0 points 0 points    Immunizations Immunization History  Administered Date(s) Administered   Hepatitis B, ADULT 07/16/2014   Hepatitis B, PED/ADOLESCENT 07/16/2014   Moderna Sars-Covid-2 Vaccination 06/13/2019, 07/16/2019   PPD Test 06/16/2014, 07/16/2014   Tdap 06/12/2023    Screening Tests Health Maintenance  Topic Date Due   COVID-19 Vaccine (3 - Moderna risk series) 08/13/2019   FOOT EXAM  10/28/2021   Zoster Vaccines- Shingrix (1 of 2) 08/22/2023 (Originally 11/11/1984)   Pneumococcal Vaccine 71-83 Years old (1 of 2 - PCV) 05/21/2024 (Originally 11/11/1984)   INFLUENZA VACCINE  10/05/2023   HEMOGLOBIN A1C  11/14/2023   OPHTHALMOLOGY EXAM  12/12/2023   Diabetic kidney evaluation - Urine ACR  01/16/2024   MAMMOGRAM  05/06/2024   Diabetic kidney evaluation - eGFR measurement  05/13/2024   Medicare  Annual Wellness (AWV)  08/07/2024   Colonoscopy  07/21/2031   Hepatitis C Screening  Completed   HIV Screening  Completed   HPV VACCINES  Aged Out   Meningococcal B Vaccine  Aged Out   DTaP/Tdap/Td  Discontinued    Health Maintenance  Health Maintenance Due  Topic Date Due   COVID-19 Vaccine (3 - Moderna risk series) 08/13/2019   FOOT EXAM  10/28/2021   Health Maintenance Items Addressed: Information provided on recommended vaccines (shingrix and prevnar)   Additional Screening:  Vision Screening: Recommended annual ophthalmology exams for early detection of glaucoma and other disorders of the eye. Would you like a referral to an eye doctor? No    Dental Screening: Recommended annual dental exams for proper oral hygiene  Community Resource Referral / Chronic Care Management: CRR required this visit?  No   CCM required this visit?  No   Plan:    I have personally reviewed and noted the following in the patient's chart:   Medical and social history Use of alcohol, tobacco or illicit drugs  Current medications and supplements including opioid prescriptions. Patient is currently taking opioid prescriptions. Information provided to patient regarding non-opioid alternatives. Patient advised to discuss non-opioid treatment plan with their provider. Functional ability and status Nutritional status Physical activity Advanced directives List of other physicians Hospitalizations, surgeries, and ER visits in previous 12 months Vitals Screenings to include cognitive, depression, and falls Referrals and appointments  In addition, I have reviewed and discussed with patient certain preventive protocols, quality metrics, and best practice recommendations. A written personalized care plan for preventive services as well as general preventive health recommendations were provided to patient.   Seabron [name] [place], [place]   10/04/1910   After Visit Summary: (MyChart) Due to this  being a telephonic visit, the after visit summary with patients personalized plan was offered to patient via MyChart   Notes: Nothing significant to report at this time.

## 2023-08-08 NOTE — Patient Instructions (Signed)
 Ms. Veenstra , Thank you for taking time out of your busy schedule to complete your Annual Wellness Visit with me. I enjoyed our conversation and look forward to speaking with you again next year. I, as well as your care team,  appreciate your ongoing commitment to your health goals. Please review the following plan we discussed and let me know if I can assist you in the future. Your Game plan/ To Do List    Follow up Visits: Next Medicare AWV with our clinical staff: In 1 year    Have you seen your provider in the last 6 months (3 months if uncontrolled diabetes)? Yes Next Office Visit with your provider: To be scheduled  Clinician Recommendations:  Aim for 30 minutes of exercise or brisk walking, 6-8 glasses of water, and 5 servings of fruits and vegetables each day.       This is a list of the screening recommended for you and due dates:  Health Maintenance  Topic Date Due   COVID-19 Vaccine (3 - Moderna risk series) 08/13/2019   Complete foot exam   10/28/2021   Zoster (Shingles) Vaccine (1 of 2) 08/22/2023*   Pneumococcal Vaccination (1 of 2 - PCV) 05/21/2024*   Flu Shot  10/05/2023   Hemoglobin A1C  11/14/2023   Eye exam for diabetics  12/12/2023   Yearly kidney health urinalysis for diabetes  01/16/2024   Mammogram  05/06/2024   Yearly kidney function blood test for diabetes  05/13/2024   Medicare Annual Wellness Visit  08/07/2024   Colon Cancer Screening  07/21/2031   Hepatitis C Screening  Completed   HIV Screening  Completed   HPV Vaccine  Aged Out   Meningitis B Vaccine  Aged Out   DTaP/Tdap/Td vaccine  Discontinued  *Topic was postponed. The date shown is not the original due date.    Advanced directives: (ACP Link)Information on Advanced Care Planning can be found at [place]  Secretary of [facility] [employer] Advance Health Care Directives Advance Health Care Directives. [email or web address]   Advance Care Planning is important because it:  [x]  Makes sure you receive the medical care  that is consistent with your values, goals, and preferences  [x]  It provides guidance to your family and loved ones and reduces their decisional burden about whether or not they are making the right decisions based on your wishes.  Follow the link provided in your after visit summary or read over the paperwork we have mailed to you to help you started getting your Advance Directives in place. If you need assistance in completing these, please reach out to us  so that we can help you!  See attachments for Preventive Care and Fall Prevention Tips.

## 2023-08-09 MED ORDER — ORACEA 40 MG PO CPDR: 40.0000 mg | DELAYED_RELEASE_CAPSULE | Freq: Every day | ORAL | 0 refills | Status: AC

## 2023-08-21 ENCOUNTER — Ambulatory Visit (HOSPITAL_BASED_OUTPATIENT_CLINIC_OR_DEPARTMENT_OTHER): Attending: Cardiology | Admitting: Cardiology

## 2023-08-21 ENCOUNTER — Other Ambulatory Visit (INDEPENDENT_AMBULATORY_CARE_PROVIDER_SITE_OTHER): Payer: Self-pay | Admitting: Family Medicine

## 2023-08-21 DIAGNOSIS — G4733 Obstructive sleep apnea (adult) (pediatric): Secondary | ICD-10-CM | POA: Insufficient documentation

## 2023-08-21 DIAGNOSIS — R0683 Snoring: Secondary | ICD-10-CM | POA: Insufficient documentation

## 2023-08-21 DIAGNOSIS — E559 Vitamin D deficiency, unspecified: Secondary | ICD-10-CM

## 2023-08-22 ENCOUNTER — Ambulatory Visit (INDEPENDENT_AMBULATORY_CARE_PROVIDER_SITE_OTHER): Admitting: Family Medicine

## 2023-08-22 ENCOUNTER — Encounter (INDEPENDENT_AMBULATORY_CARE_PROVIDER_SITE_OTHER): Payer: Self-pay | Admitting: Family Medicine

## 2023-08-22 VITALS — BP 102/69 | HR 84 | Temp 97.8°F | Ht 66.0 in | Wt 205.0 lb

## 2023-08-22 DIAGNOSIS — Z6833 Body mass index (BMI) 33.0-33.9, adult: Secondary | ICD-10-CM

## 2023-08-22 DIAGNOSIS — E559 Vitamin D deficiency, unspecified: Secondary | ICD-10-CM | POA: Diagnosis not present

## 2023-08-22 DIAGNOSIS — T383X5A Adverse effect of insulin and oral hypoglycemic [antidiabetic] drugs, initial encounter: Secondary | ICD-10-CM | POA: Diagnosis not present

## 2023-08-22 DIAGNOSIS — K5903 Drug induced constipation: Secondary | ICD-10-CM

## 2023-08-22 DIAGNOSIS — Z7985 Long-term (current) use of injectable non-insulin antidiabetic drugs: Secondary | ICD-10-CM

## 2023-08-22 DIAGNOSIS — E1169 Type 2 diabetes mellitus with other specified complication: Secondary | ICD-10-CM | POA: Diagnosis not present

## 2023-08-22 DIAGNOSIS — E669 Obesity, unspecified: Secondary | ICD-10-CM | POA: Insufficient documentation

## 2023-08-22 MED ORDER — POLYETHYLENE GLYCOL 3350 17 GM/SCOOP PO POWD: ORAL | 0 refills | Status: DC

## 2023-08-22 MED ORDER — TIRZEPATIDE 5 MG/0.5ML ~~LOC~~ SOAJ
5.0000 mg | SUBCUTANEOUS | 0 refills | Status: DC
Start: 2023-08-22 — End: 2023-09-17

## 2023-08-22 MED ORDER — VITAMIN D (ERGOCALCIFEROL) 1.25 MG (50000 UNIT) PO CAPS: 50000.0000 [IU] | ORAL_CAPSULE | ORAL | 0 refills | Status: DC

## 2023-08-22 MED ORDER — SENNOSIDES-DOCUSATE SODIUM 8.6-50 MG PO TABS: ORAL_TABLET | ORAL | 0 refills | Status: DC

## 2023-08-22 NOTE — Progress Notes (Signed)
 [name], D.O.  ABFM, ABOM Specializing in Clinical Bariatric Medicine  Office located at: [address], [place]  [address]   Assessment and Plan:  No orders of the defined types were placed in this encounter.  Medications Discontinued During This Encounter  Medication Reason   senna-docusate (SENOKOT-S) 8.6-50 MG tablet Reorder   polyethylene glycol powder (GLYCOLAX /MIRALAX ) 17 GM/SCOOP powder Reorder   Vitamin D , Ergocalciferol , (DRISDOL ) 1.25 MG (50000 UNIT) CAPS capsule Reorder   tirzepatide  (MOUNJARO ) 5 MG/0.5ML Pen Reorder    Meds ordered this encounter  Medications   Vitamin D , Ergocalciferol , (DRISDOL ) 1.25 MG (50000 UNIT) CAPS capsule    Sig: Take 1 capsule (50,000 Units total) by mouth every 7 (seven) days.    Dispense:  4 capsule    Refill:  0   tirzepatide  (MOUNJARO ) 5 MG/0.5ML Pen    Sig: Inject 5 mg into the skin once a week.    Dispense:  2 mL    Refill:  0   senna-docusate (SENOKOT-S) 8.6-50 MG tablet    Sig: 1 po bid FOR CONSTIPATION, then once daily    Dispense:  60 tablet    Refill:  0   polyethylene glycol powder (GLYCOLAX /MIRALAX ) 17 GM/SCOOP powder    Sig: BID until normal stool, Then 1 daily, then prn    Dispense:  510 g    Refill:  0      FOR THE DISEASE OF OBESITY: BMI 30.0-39.9, adult -- Current BMI 33.1 Morbid obesity START BMI 35.04-DATE 05/14/23 Assessment & Plan: Since last office visit on 07/17/13 patient's muscle mass has decreased by 0.6 lbs. Fat mass has decreased by 2.8 lbs. Total body water has increased by 0.2 lbs.  Counseling done on how various foods will affect these numbers and how to maximize success  Total lbs lost to date: 12 lbs  Total weight loss percentage to date: -5.53%    Recommended Dietary Goals [name] is currently in the action stage of change. As such, her goal is to continue weight management plan.  She has agreed to: continue current plan   Behavioral Intervention We discussed the  following today: increasing lean protein intake to established goals, decreasing simple carbohydrates , increasing vegetables, increasing lower glycemic fruits, avoiding skipping meals, increasing water intake , keeping healthy foods at home, identifying sources and decreasing liquid calories, decreasing eating out or consumption of processed foods, and making healthy choices when eating convenient foods, better snacking choices, going to the skinnytaste website for recipe ideas, and using GPT or another AI platform for recipe ideas- searching low calorie, low carb, high protein chicken recipes etc  Additional resources provided today: Handout on the concepts of Adaptive Thermogenesis and Handout on Common Characteristics of Successful Weight Losers, NEAT handout.  Evidence-based interventions for health behavior change were utilized today including the discussion of self monitoring techniques, problem-solving barriers and SMART goal setting techniques.   Regarding patient's less desirable eating habits and patterns, we employed the technique of small changes.   Pt will specifically work on: Increase NEAT and fine acts that she enjoys on a daily basis for next visit.    Recommended Physical Activity Goals [name] has been advised to work up to 300-450 minutes of moderate intensity aerobic activity a week and strengthening exercises 2-3 times per week for cardiovascular health, weight loss maintenance and preservation of muscle mass.   She has agreed to: Increase physical activity in their day and reduce sedentary time (increase NEAT). and  continue to gradually increase the amount and intensity of exercise routine   Pharmacotherapy We both agreed to: Adequate clinical response to anti-obesity medication, continue current regimen   ASSOCIATED CONDITIONS ADDRESSED TODAY: Type 2 diabetes mellitus with obesity [facility]) Assessment & Plan: Lab Results  Component Value Date   HGBA1C 6.7 (H) 05/14/2023    HGBA1C 6.6 (H) 01/16/2023   HGBA1C 6.2 (H) 07/27/2022   INSULIN  17.8 05/14/2023    Compliant with Mounjaro  5 mg once weekly. Reports occasional constipation, but otherwise is tolerating well. She admits to eating off plan, but was still mindful of portion sizes. Avoid skipping meals. Increase overall activity/NEAT and gradually increase intensity of exercises. Increase water intake, especially on days she is exercising. Continue working on following her nutritional meal plan as advised. Continue Mounjaro , no changes. Recheck A1c and insulin  periodically, 3-4 months from last obtained.   Orders: - Refill Mounjaro    Vitamin D  deficiency Assessment & Plan: Lab Results  Component Value Date   VD25OH 13.0 (L) 05/14/2023   Pt is on ERGO 50K units once weekly with good compliance and tolerance. Denies any adverse SE. Reviewed ideal vit D goal of 50-70. Will continue vit D supplementation as prescribed. Will recheck periodically.    Orders: - Refill ERGO, no changes   Chronic constipation with worsening sx d/t Drug-induced constipation Assessment & Plan: H/o constipation induced by Mounjaro . Pt is taking Miralax  every other day and Senokot as prescribed. Her last bowel movement was this morning. Her bowel movements are overall normal for her. At times she goes 4 days without having a bowel movement. Discussed the option of decreasing Mounjaro  or discontinuing altogether. Pt would like to continue Mounjaro  and is interested in increasing Senokot. Plan to increase Senokot to twice daily for constipation, with plan to reduce to once daily once bowel movements normalize.Patient verbalized understanding/agreement to this plan.  Orders: - INCREASE Senokot - Refill Miralax   Follow up:   Return in about 26 days (around 09/17/2023) for 1 mo f/u on 7/14. She was informed of the importance of frequent follow up visits to maximize her success with intensive lifestyle modifications for her multiple  health conditions.  Subjective:   Chief complaint: Obesity [name] is here to discuss her progress with her obesity treatment plan. She is on the Category 2 Plan and states she is following her eating plan approximately 50% of the time. She states she is walking 15 minutes 2 days per week.  Interval History:  [name] is here for a follow up office visit. Since last OV on 5/14, she has lost 2 lbs. She was out of town visiting her son in [place]  and did not follow her meal plan and was eating off plan foods such as ribs, mac & cheese, and other unhealthy sides. She was focusing on portion control while eating off plan.   Pharmacotherapy for weight loss: She is currently taking Monjauro with diabetes as the primary indication with adequate clinical response  and without side effects..   Review of Systems:  Pertinent positives were addressed with patient today.  Reviewed by clinician on day of visit: allergies, medications, problem list, medical history, surgical history, family history, social history, and previous encounter notes.  Weight Summary and Biometrics   Weight Lost Since Last Visit: 2lb  Weight Gained Since Last Visit: 0lb   Vitals Temp: 97.8 F (36.6 C) BP: 102/69 Pulse Rate: 84 SpO2: 98 %   Anthropometric Measurements Height: 5' 6 (1.676 m) Weight: 205  lb (93 kg) BMI (Calculated): 33.1 Weight at Last Visit: 207lb Weight Lost Since Last Visit: 2lb Weight Gained Since Last Visit: 0lb Starting Weight: 217lb Total Weight Loss (lbs): 12 lb (5.443 kg) Peak Weight: 232lb   Body Composition  Body Fat %: 43.2 % Fat Mass (lbs): 88.6 lbs Muscle Mass (lbs): 110.6 lbs Total Body Water (lbs): 75.2 lbs Visceral Fat Rating : 11   Other Clinical Data Fasting: No Labs: No Today's Visit #: 6 Starting Date: 05/14/23    Objective:   PHYSICAL EXAM: Blood pressure 102/69, pulse 84, temperature 97.8 F (36.6 C), height 5' 6 (1.676 m), weight 205 lb (93 kg), SpO2  98%. Body mass index is 33.09 kg/m.  General: she is overweight, cooperative and in no acute distress. PSYCH: Has normal mood, affect and thought process.   HEENT: EOMI, sclerae are anicteric. Lungs: Normal breathing effort, no conversational dyspnea. Extremities: Moves * 4 Neurologic: A and O * 3, good insight  DIAGNOSTIC DATA REVIEWED: BMET    Component Value Date/Time   NA 139 05/14/2023 1057   K 4.3 05/14/2023 1057   CL 103 05/14/2023 1057   CO2 20 05/14/2023 1057   GLUCOSE 118 (H) 05/14/2023 1057   GLUCOSE 137 (H) 01/16/2023 0845   BUN 9 05/14/2023 1057   CREATININE 0.70 05/14/2023 1057   CREATININE 0.61 01/16/2023 0845   CALCIUM  9.2 05/14/2023 1057   GFRNONAA >60 07/27/2022 1400   GFRNONAA 106 02/16/2020 1144   GFRAA 123 02/16/2020 1144   Lab Results  Component Value Date   HGBA1C 6.7 (H) 05/14/2023   HGBA1C 5.2 05/19/2013   Lab Results  Component Value Date   INSULIN  17.8 05/14/2023   Lab Results  Component Value Date   TSH 1.890 05/14/2023   CBC    Component Value Date/Time   WBC 7.1 05/14/2023 1057   WBC 6.7 03/02/2023 0943   RBC 4.43 05/14/2023 1057   RBC 4.77 03/02/2023 0943   HGB 13.1 05/14/2023 1057   HCT 38.7 05/14/2023 1057   PLT 321 05/14/2023 1057   MCV 87 05/14/2023 1057   MCH 29.6 05/14/2023 1057   MCH 29.4 03/02/2023 0943   MCHC 33.9 05/14/2023 1057   MCHC 33.3 03/02/2023 0943   RDW 12.3 05/14/2023 1057   Iron Studies No results found for: IRON, TIBC, FERRITIN, IRONPCTSAT Lipid Panel     Component Value Date/Time   CHOL 212 (H) 05/14/2023 1057   TRIG 134 05/14/2023 1057   HDL 59 05/14/2023 1057   CHOLHDL 3.6 05/14/2023 1057   CHOLHDL 3.5 01/16/2023 0845   VLDL 26 05/28/2019 0955   LDLCALC 129 (H) 05/14/2023 1057   LDLCALC 125 (H) 01/16/2023 0845   Hepatic Function Panel     Component Value Date/Time   PROT 6.8 06/25/2023 0821   ALBUMIN 4.2 06/25/2023 0821   AST 22 06/25/2023 0821   ALT 25 06/25/2023 0821    ALKPHOS 168 (H) 06/25/2023 0821   BILITOT 0.4 06/25/2023 0821   BILIDIR 0.16 06/25/2023 0821   IBILI 0.3 03/02/2023 0943      Component Value Date/Time   TSH 1.890 05/14/2023 1057   Nutritional Lab Results  Component Value Date   VD25OH 13.0 (L) 05/14/2023    Attestations:   LILLETTE Vernell Forest, acting as a medical scribe for [name] Jenkins, DO., have compiled all relevant documentation for today's office visit on behalf of [name] Jenkins, DO, while in the presence of [employer], DO.  Reviewed by clinician on day of  visit: allergies, medications, problem list, medical history, surgical history, family history, social history, and previous encounter notes pertinent to patient's obesity diagnosis.  I have reviewed the above documentation for accuracy and completeness, and I agree with the above. [name] [name], D.O.  The 21st Century Cures Act was signed into law in 2016 which includes the topic of electronic health records.  This provides immediate access to information in MyChart.  This includes consultation notes, operative notes, office notes, lab results and pathology reports.  If you have any questions about what you read please let us  know at your next visit so we can discuss your concerns and take corrective action if need be.  We are right here with you.

## 2023-08-27 NOTE — Procedures (Signed)
   [name] [address] [place], [place] [address] Tel: [phone]   Fax: [phone]  Titration Interpretation  Patient Name:  [name], [name] Date:  08/21/2023 Referring Physician:  Shlomo Corning, MD  Indications for Polysomnography The patient is a 58 year old Female who is 5' 6 and weighs 207.0 lbs. Her BMI equals 33.7.  A full night titration treatment study was performed.  Medication were taken at 9 pm  Zonisamide    Polysomnogram Data A full night polysomnogram recorded the standard physiologic parameters including EEG, EOG, EMG, EKG, nasal and oral airflow.  Respiratory parameters of chest and abdominal movements were recorded with Respiratory Inductance Plethysmography belts.  Oxygen  saturation was recorded by pulse oximetry.   Sleep Architecture The total recording time of the polysomnogram was 393.9 minutes.  The total sleep time was 324.5 minutes.  The patient spent 1.7% of total sleep time in Stage N1, 35.3% in Stage N2, 63.0% in Stages N3, and 0.0% in REM.  Sleep latency was 31.1 minutes.  REM latency was - minutes.  Sleep Efficiency was 82.4%.  Wake after Sleep Onset time was 38.0 minutes.  Titration Summary The patient was titrated at pressures ranging from 6 cm/H20 up to 15 cm/H20. The last pressure used in the study was 15 cm/H20.  Respiratory Events The polysomnogram revealed a presence of 5 obstructive, 2 centrals, and 0 mixed apneas resulting in an Apnea index of 1.3 events per hour.  There were 40 hypopneas (>=3% desaturation and/or arousal) resulting in an Apnea\Hypopnea Index (AHI >=3% desaturation and/or arousal) of 8.7 events per hour.  There were 15 hypopneas (>=4% desaturation) resulting in an Apnea\Hypopnea Index (AHI >=4% desaturation) of 4.1 events per hour.  There were 18 Respiratory Effort Related Arousals resulting in a RERA index of 3.3 events per hour. The Respiratory Disturbance Index is 12.0 events per  hour.  The snore index was 0 events per hour.  Mean oxygen  saturation was 94.7%.  The lowest oxygen  saturation during sleep was 86.0%.  Time spent <=88% oxygen  saturation was 1.2 minutes (0.3%).  Limb Activity There were 0- limb movements recorded  Cardiac Summary The average pulse rate was 69.1 bpm.  The minimum pulse rate was 42.0 bpm while the maximum pulse rate was 90.0 bpm.  Cardiac rhythm was normal.  Diagnosis:  Obstructive Sleep Apnea Successful CPAP titration  Recommendations: Recommend a trial of ResMed CPAP at 15cm H2O with heated humidity and extra small ResMed Airfit F10 full face mask. The patient should be counseled in good sleep hygiene and avoid sleeping in the supine position. Encourage patient to avoid driving when sleepy. Followup in Sleep Clinic in 6 weeks.   This study was personally reviewed and electronically signed by: Shlomo Corning, MD Accredited Board Certified in Sleep Medicine Date/Time: 08/27/2023 7:28PM

## 2023-08-28 ENCOUNTER — Telehealth: Payer: Self-pay

## 2023-08-28 NOTE — Telephone Encounter (Signed)
-----   Message from Wilbert Bihari sent at 08/27/2023  7:29 PM EDT ----- Please let patient know that they had a successful PAP titration and let DME know that orders are in EPIC.  Please set up 6 week OV with me.

## 2023-08-28 NOTE — Telephone Encounter (Signed)
 Notified patient of Titration results and recommendations. All questions were answered and patient verbalized understanding. Order for new device and supplies sent to [facility] today.

## 2023-08-29 NOTE — Progress Notes (Incomplete)
 [name], D.O.  ABFM, ABOM Specializing in Clinical Bariatric Medicine  Office located at: [address] [place], [place]  [address]   Assessment and Plan:  No orders of the defined types were placed in this encounter.  Medications Discontinued During This Encounter  Medication Reason  . senna-docusate (SENOKOT-S) 8.6-50 MG tablet Reorder  . polyethylene glycol powder (GLYCOLAX /MIRALAX ) 17 GM/SCOOP powder Reorder  . Vitamin D , Ergocalciferol , (DRISDOL ) 1.25 MG (50000 UNIT) CAPS capsule Reorder  . tirzepatide  (MOUNJARO ) 5 MG/0.5ML Pen Reorder    Meds ordered this encounter  Medications  . Vitamin D , Ergocalciferol , (DRISDOL ) 1.25 MG (50000 UNIT) CAPS capsule    Sig: Take 1 capsule (50,000 Units total) by mouth every 7 (seven) days.    Dispense:  4 capsule    Refill:  0  . tirzepatide  (MOUNJARO ) 5 MG/0.5ML Pen    Sig: Inject 5 mg into the skin once a week.    Dispense:  2 mL    Refill:  0  . senna-docusate (SENOKOT-S) 8.6-50 MG tablet    Sig: 1 po bid FOR CONSTIPATION, then once daily    Dispense:  60 tablet    Refill:  0  . polyethylene glycol powder (GLYCOLAX /MIRALAX ) 17 GM/SCOOP powder    Sig: BID until normal stool, Then 1 daily, then prn    Dispense:  510 g    Refill:  0      FOR THE DISEASE OF OBESITY: BMI 30.0-39.9, adult -- Current BMI 33.1 Morbid obesity START BMI 35.04-DATE 05/14/23 Assessment & Plan: Since last office visit on 07/17/13 patient's muscle mass has decreased by 0.6 lbs. Fat mass has decreased by 2.8 lbs. Total body water has increased by 0.2 lbs.  Counseling done on how various foods will affect these numbers and how to maximize success  Total lbs lost to date: 12 lbs  Total weight loss percentage to date: -5.53%    Recommended Dietary Goals Breauna is currently in the action stage of change. As such, her goal is to continue weight management plan.  She has agreed to: continue current plan   Behavioral Intervention We discussed  the following today: increasing lean protein intake to established goals, decreasing simple carbohydrates , increasing vegetables, increasing lower glycemic fruits, avoiding skipping meals, increasing water intake , keeping healthy foods at home, identifying sources and decreasing liquid calories, decreasing eating out or consumption of processed foods, and making healthy choices when eating convenient foods, better snacking choices, going to the skinnytaste website for recipe ideas, and using GPT or another AI platform for recipe ideas- searching low calorie, low carb, high protein chicken recipes etc  Additional resources provided today: Handout on the concepts of Adaptive Thermogenesis and Handout on Common Characteristics of Successful Weight Losers, NEAT handout.  Evidence-based interventions for health behavior change were utilized today including the discussion of self monitoring techniques, problem-solving barriers and SMART goal setting techniques.   Regarding patient's less desirable eating habits and patterns, we employed the technique of small changes.   Pt will specifically work on: *** for next visit.    Recommended Physical Activity Goals Twyla has been advised to work up to 300-450 minutes of moderate intensity aerobic activity a week and strengthening exercises 2-3 times per week for cardiovascular health, weight loss maintenance and preservation of muscle mass.   She has agreed to: Increase physical activity in their day and reduce sedentary time (increase NEAT). and continue to gradually increase the amount and intensity of exercise routine  Pharmacotherapy We both agreed to: {EMagreedrx:[id number]}   ASSOCIATED CONDITIONS ADDRESSED TODAY: Type 2 diabetes mellitus with obesity (HCC) Assessment & Plan: Lab Results  Component Value Date   HGBA1C 6.7 (H) 05/14/2023   HGBA1C 6.6 (H) 01/16/2023   HGBA1C 6.2 (H) 07/27/2022   INSULIN  17.8 05/14/2023        ***  Vitamin D   deficiency Assessment & Plan: Lab Results  Component Value Date   VD25OH 13.0 (L) 05/14/2023      ***  Chronic constipation with worsening sx d/t Drug-induced constipation Assessment & Plan: Endorses constipation. ***  Miralax  taking every other day  - take twice daily and once a day once she has normal Bms  Take medications as written     ***     Follow up:   Return in about 26 days (around 09/17/2023) for 1 mo f/u on 7/14. She was informed of the importance of frequent follow up visits to maximize her success with intensive lifestyle modifications for her multiple health conditions.  Subjective:   Chief complaint: Obesity [name] is here to discuss her progress with her obesity treatment plan. She is on the Category 2 Plan and states she is following her eating plan approximately 50% of the time. She states she is walking 15 minutes 2 days per week.  Interval History:  [name] is here for a follow up office visit. Since last OV on 5/14, she has lost 2 lbs. She was out of town visiting her son in [place]  and did not follow her meal plan and was eating off plan foods such as ribs, mac & cheese, and other unhealthy sides. She was focusing on portion control while eating off plan.   Pharmacotherapy for weight loss: She is currently taking Monjauro with diabetes as the primary indication with adequate clinical response  and without side effects..   Review of Systems:  Pertinent positives were addressed with patient today.  Reviewed by clinician on day of visit: allergies, medications, problem list, medical history, surgical history, family history, social history, and previous encounter notes.  Weight Summary and Biometrics   Weight Lost Since Last Visit: 2lb  Weight Gained Since Last Visit: 0lb  ***  Vitals Temp: 97.8 F (36.6 C) BP: 102/69 Pulse Rate: 84 SpO2: 98 %   Anthropometric Measurements Height: 5' 6 (1.676 m) Weight: 205 lb (93 kg) BMI  (Calculated): 33.1 Weight at Last Visit: 207lb Weight Lost Since Last Visit: 2lb Weight Gained Since Last Visit: 0lb Starting Weight: 217lb Total Weight Loss (lbs): 12 lb (5.443 kg) Peak Weight: 232lb   Body Composition  Body Fat %: 43.2 % Fat Mass (lbs): 88.6 lbs Muscle Mass (lbs): 110.6 lbs Total Body Water (lbs): 75.2 lbs Visceral Fat Rating : 11   Other Clinical Data Fasting: No Labs: No Today's Visit #: 6 Starting Date: 05/14/23    Objective:   PHYSICAL EXAM: Blood pressure 102/69, pulse 84, temperature 97.8 F (36.6 C), height 5' 6 (1.676 m), weight 205 lb (93 kg), SpO2 98%. Body mass index is 33.09 kg/m.  General: she is overweight, cooperative and in no acute distress. PSYCH: Has normal mood, affect and thought process.   HEENT: EOMI, sclerae are anicteric. Lungs: Normal breathing effort, no conversational dyspnea. Extremities: Moves * 4 Neurologic: A and O * 3, good insight  DIAGNOSTIC DATA REVIEWED: BMET    Component Value Date/Time   NA 139 05/14/2023 1057   K 4.3 05/14/2023 1057   CL 103 05/14/2023 1057  CO2 20 05/14/2023 1057   GLUCOSE 118 (H) 05/14/2023 1057   GLUCOSE 137 (H) 01/16/2023 0845   BUN 9 05/14/2023 1057   CREATININE 0.70 05/14/2023 1057   CREATININE 0.61 01/16/2023 0845   CALCIUM  9.2 05/14/2023 1057   GFRNONAA >60 07/27/2022 1400   GFRNONAA 106 02/16/2020 1144   GFRAA 123 02/16/2020 1144   Lab Results  Component Value Date   HGBA1C 6.7 (H) 05/14/2023   HGBA1C 5.2 05/19/2013   Lab Results  Component Value Date   INSULIN  17.8 05/14/2023   Lab Results  Component Value Date   TSH 1.890 05/14/2023   CBC    Component Value Date/Time   WBC 7.1 05/14/2023 1057   WBC 6.7 03/02/2023 0943   RBC 4.43 05/14/2023 1057   RBC 4.77 03/02/2023 0943   HGB 13.1 05/14/2023 1057   HCT 38.7 05/14/2023 1057   PLT 321 05/14/2023 1057   MCV 87 05/14/2023 1057   MCH 29.6 05/14/2023 1057   MCH 29.4 03/02/2023 0943   MCHC 33.9  05/14/2023 1057   MCHC 33.3 03/02/2023 0943   RDW 12.3 05/14/2023 1057   Iron Studies No results found for: IRON, TIBC, FERRITIN, IRONPCTSAT Lipid Panel     Component Value Date/Time   CHOL 212 (H) 05/14/2023 1057   TRIG 134 05/14/2023 1057   HDL 59 05/14/2023 1057   CHOLHDL 3.6 05/14/2023 1057   CHOLHDL 3.5 01/16/2023 0845   VLDL 26 05/28/2019 0955   LDLCALC 129 (H) 05/14/2023 1057   LDLCALC 125 (H) 01/16/2023 0845   Hepatic Function Panel     Component Value Date/Time   PROT 6.8 06/25/2023 0821   ALBUMIN 4.2 06/25/2023 0821   AST 22 06/25/2023 0821   ALT 25 06/25/2023 0821   ALKPHOS 168 (H) 06/25/2023 0821   BILITOT 0.4 06/25/2023 0821   BILIDIR 0.16 06/25/2023 0821   IBILI 0.3 03/02/2023 0943      Component Value Date/Time   TSH 1.890 05/14/2023 1057   Nutritional Lab Results  Component Value Date   VD25OH 13.0 (L) 05/14/2023    Attestations:   LILLETTE Vernell Forest, acting as a medical scribe for [name] Jenkins, DO., have compiled all relevant documentation for today's office visit on behalf of [name] Jenkins, DO, while in the presence of [employer], DO.  Reviewed by clinician on day of visit: allergies, medications, problem list, medical history, surgical history, family history, social history, and previous encounter notes pertinent to patient's obesity diagnosis.  I have spent 40 *** minutes in the care of the patient today including: preparing to see patient (e.g. review and interpretation of tests, old notes ), obtaining and/or reviewing separately obtained history, performing a medically appropriate examination or evaluation, counseling and educating the patient, ordering medications, test or procedures, documenting clinical information in the electronic or other health care record, and independently interpreting results and communicating results to the patient, family, or caregiver   I have reviewed the above documentation for accuracy and  completeness, and I agree with the above. [name], D.O.  The 21st Century Cures Act was signed into law in 2016 which includes the topic of electronic health records.  This provides immediate access to information in MyChart.  This includes consultation notes, operative notes, office notes, lab results and pathology reports.  If you have any questions about what you read please let us  know at your next visit so we can discuss your concerns and take corrective action if need be.  We are  right here with you.

## 2023-08-31 ENCOUNTER — Other Ambulatory Visit: Payer: Self-pay

## 2023-08-31 DIAGNOSIS — F32A Anxiety disorder, unspecified: Secondary | ICD-10-CM

## 2023-08-31 NOTE — Telephone Encounter (Signed)
 Prescription Request  08/31/2023  LOV: 05/22/23  What is the name of the medication or equipment? venlafaxine  XR (EFFEXOR -XR) 150 MG 24 hr capsule [[id number]]   Have you contacted your pharmacy to request a refill? Yes   Which pharmacy would you like this sent to?  [id number] - [place], [place] - [address] AT [facility] [address] [place] [address] Phone: [phone] Fax: [phone]    Patient notified that their request is being sent to the clinical staff for review and that they should receive a response within 2 business days.   Please advise at [facility] [phone]

## 2023-09-03 MED ORDER — VENLAFAXINE HCL ER 150 MG PO CP24: 300.0000 mg | ORAL_CAPSULE | Freq: Every day | ORAL | 0 refills | Status: AC

## 2023-09-03 NOTE — Telephone Encounter (Signed)
Upon patient request DME selection is [facility] Patient understands he will be contacted by [facility] to set up his cpap. Patient understands to call if [facility] does not contact him with new setup in a timely manner. Patient understands they will be called once confirmation has been received from [place] that they have received their new machine to schedule 10 week follow up appointment.  [facility] notified of new cpap order  Please add to airview Patient was grateful for the call and thanked me.

## 2023-09-03 NOTE — Telephone Encounter (Signed)
 Requested Prescriptions  Pending Prescriptions Disp Refills   venlafaxine  XR (EFFEXOR -XR) 150 MG 24 hr capsule 180 capsule 0    Sig: Take 2 capsules (300 mg total) by mouth daily with breakfast.     Psychiatry: Antidepressants - SNRI - desvenlafaxine & venlafaxine  Failed - 09/03/2023  1:07 PM      Failed - Valid encounter within last 6 months    Recent Outpatient Visits           3 months ago Left foot pain   [place] [facility] Aletha Bene, MD   7 months ago Controlled type 2 diabetes mellitus with complication, without long-term current use of insulin  [facility]   [facility] Duanne Butler DASEN, MD   1 year ago Controlled type 2 diabetes mellitus with complication, without long-term current use of insulin  [facility])   [place] [facility] Pickard, Butler DASEN, MD   1 year ago Fever, unspecified fever cause   [place] [facility] Duanne, Butler DASEN, MD   1 year ago Trochanteric bursitis of right hip   [facility] Duanne Butler DASEN, MD              Failed - Lipid Panel in normal range within the last 12 months    Cholesterol, Total  Date Value Ref Range Status  05/14/2023 212 (H) 100 - 199 mg/dL Final   LDL Cholesterol (Calc)  Date Value Ref Range Status  01/16/2023 125 (H) mg/dL (calc) Final    Comment:    Reference range: <100 . Desirable range <100 mg/dL for primary prevention;   <70 mg/dL for patients with CHD or diabetic patients  with > or = 2 CHD risk factors. SABRA LDL-C is now calculated using the Martin-Hopkins  calculation, which is a validated novel method providing  better accuracy than the Friedewald equation in the  estimation of LDL-C.  Gladis APPLETHWAITE et al. SANDREA. 7986;689(80): 2061-2068  (http://education.[employer].com/faq/FAQ164)    LDL Chol Calc (NIH)  Date Value Ref Range Status  05/14/2023 129 (H) 0 - 99 mg/dL Final   HDL  Date Value Ref  Range Status  05/14/2023 59 >39 mg/dL Final   Triglycerides  Date Value Ref Range Status  05/14/2023 134 0 - 149 mg/dL Final         Passed - Cr in normal range and within 360 days    Creat  Date Value Ref Range Status  01/16/2023 0.61 0.50 - 1.03 mg/dL Final   Creatinine, Ser  Date Value Ref Range Status  05/14/2023 0.70 0.57 - 1.00 mg/dL Final   Creatinine, Urine  Date Value Ref Range Status  01/16/2023 256 20 - 275 mg/dL Final         Passed - Completed PHQ-2 or PHQ-9 in the last 360 days      Passed - Last BP in normal range    BP Readings from Last 1 Encounters:  08/22/23 102/69

## 2023-09-05 ENCOUNTER — Other Ambulatory Visit: Payer: Self-pay

## 2023-09-05 DIAGNOSIS — K760 Fatty (change of) liver, not elsewhere classified: Secondary | ICD-10-CM

## 2023-09-05 DIAGNOSIS — R7989 Other specified abnormal findings of blood chemistry: Secondary | ICD-10-CM

## 2023-09-05 DIAGNOSIS — R768 Other specified abnormal immunological findings in serum: Secondary | ICD-10-CM

## 2023-09-06 ENCOUNTER — Telehealth: Payer: Self-pay | Admitting: Cardiology

## 2023-09-06 NOTE — Telephone Encounter (Signed)
 Pt requesting c/b regarding Sleep study and CPAP. Please advise

## 2023-09-06 NOTE — Telephone Encounter (Signed)
 Return Call: Reached out to patient and all questions were answered. Patient was grateful for the call.

## 2023-09-17 ENCOUNTER — Encounter (INDEPENDENT_AMBULATORY_CARE_PROVIDER_SITE_OTHER): Payer: Self-pay | Admitting: Family Medicine

## 2023-09-17 ENCOUNTER — Ambulatory Visit (INDEPENDENT_AMBULATORY_CARE_PROVIDER_SITE_OTHER): Admitting: Family Medicine

## 2023-09-17 VITALS — BP 115/76 | HR 85 | Temp 98.2°F | Ht 66.0 in | Wt 203.0 lb

## 2023-09-17 DIAGNOSIS — E1169 Type 2 diabetes mellitus with other specified complication: Secondary | ICD-10-CM

## 2023-09-17 DIAGNOSIS — I152 Hypertension secondary to endocrine disorders: Secondary | ICD-10-CM | POA: Diagnosis not present

## 2023-09-17 DIAGNOSIS — Z6841 Body Mass Index (BMI) 40.0 and over, adult: Secondary | ICD-10-CM

## 2023-09-17 DIAGNOSIS — E559 Vitamin D deficiency, unspecified: Secondary | ICD-10-CM | POA: Diagnosis not present

## 2023-09-17 DIAGNOSIS — E1159 Type 2 diabetes mellitus with other circulatory complications: Secondary | ICD-10-CM | POA: Diagnosis not present

## 2023-09-17 DIAGNOSIS — K5903 Drug induced constipation: Secondary | ICD-10-CM

## 2023-09-17 DIAGNOSIS — Z6832 Body mass index (BMI) 32.0-32.9, adult: Secondary | ICD-10-CM

## 2023-09-17 DIAGNOSIS — Z7985 Long-term (current) use of injectable non-insulin antidiabetic drugs: Secondary | ICD-10-CM

## 2023-09-17 DIAGNOSIS — E669 Obesity, unspecified: Secondary | ICD-10-CM

## 2023-09-17 MED ORDER — VITAMIN D (ERGOCALCIFEROL) 1.25 MG (50000 UNIT) PO CAPS: 50000.0000 [IU] | ORAL_CAPSULE | ORAL | 0 refills | Status: DC

## 2023-09-17 MED ORDER — TIRZEPATIDE 5 MG/0.5ML ~~LOC~~ SOAJ: 5.0000 mg | SUBCUTANEOUS | 0 refills | Status: DC

## 2023-09-17 MED ORDER — SENNOSIDES-DOCUSATE SODIUM 8.6-50 MG PO TABS: ORAL_TABLET | ORAL | Status: DC

## 2023-09-17 NOTE — Progress Notes (Signed)
 [name], D.O.  ABFM, ABOM Specializing in Clinical Bariatric Medicine  Office located at: [address] [place], [place]  [address]    Assessment and Plan:   Medications Discontinued During This Encounter  Medication Reason   Vitamin D , Ergocalciferol , (DRISDOL ) 1.25 MG (50000 UNIT) CAPS capsule Reorder   tirzepatide  (MOUNJARO ) 5 MG/0.5ML Pen Reorder   senna-docusate (SENOKOT-S) 8.6-50 MG tablet Reorder     Meds ordered this encounter  Medications   Vitamin D , Ergocalciferol , (DRISDOL ) 1.25 MG (50000 UNIT) CAPS capsule    Sig: Take 1 capsule (50,000 Units total) by mouth every 7 (seven) days.    Dispense:  4 capsule    Refill:  0   tirzepatide  (MOUNJARO ) 5 MG/0.5ML Pen    Sig: Inject 5 mg into the skin once a week.    Dispense:  2 mL    Refill:  0   senna-docusate (SENOKOT-S) 8.6-50 MG tablet    Sig: 1 po bid FOR CONSTIPATION, then once daily     Recheck non-fasting labs (A1c, Vit D, and CMP) next OV.   FOR THE DISEASE OF OBESITY:  BMI 30.0-39.9, adult - Current 32.77 Obesity, Starting BMI 35.04 Assessment & Plan: Since last office visit on 08/22/2023 patient's muscle mass has increased by 2.2 lbs. Fat mass has decreased by 4.2 lbs. Total body water has increased by 2 lbs.  Counseling done on how various foods will affect these numbers and how to maximize success  Total lbs lost to date: - 14 lbs  Total weight loss percentage to date: -6.45%    Recommended Dietary Goals Natelie is currently in the action stage of change. As such, her goal is to continue weight management plan.  She has agreed to: continue current plan   Behavioral Intervention We discussed the following today: importance of measuring fruit and protein intake,  increasing lean protein intake to established goals, continue to work on implementation of reduced calorie nutritional plan, and focusing on food with a 10:1 ratio of calories: grams of protein  Additional resources provided  today: None  Evidence-based interventions for health behavior change were utilized today including the discussion of self monitoring techniques, problem-solving barriers and SMART goal setting techniques.   Regarding patient's less desirable eating habits and patterns, we employed the technique of small changes.   Pt's goal is to weigh her self at home no more than twice weekly (currently weighing herself daily) and paying attention to portions.   Recommended Physical Activity Goals [name] has been advised to work up to 300-450 minutes of moderate intensity aerobic activity a week and strengthening exercises 2-3 times per week for cardiovascular health, weight loss maintenance and preservation of muscle mass.   She has agreed to: continue to gradually increase the amount and intensity of exercise routine   Pharmacotherapy Continue same regimen.    ASSOCIATED CONDITIONS ADDRESSED TODAY:   Type 2 diabetes mellitus with obesity [facility]) Assessment & Plan: Lab Results  Component Value Date   HGBA1C 6.7 (H) 05/14/2023   HGBA1C 6.6 (H) 01/16/2023   HGBA1C 6.2 (H) 07/27/2022   INSULIN  17.8 05/14/2023    Denies symptoms of hypoglycemia or hyperglycemia. On Mounjaro  5 mg weekly with good adherence. She sometimes has cravings but feels this is related to not getting in enough protein. No complaints of excessive hunger.   - Pt will work on measuring/increasing protein her intake for craving suppression.  - Continue Mounjaro ; consider increasing dose next OV if cravings persist.  -  Continue balanced diet focusing on protein, fruits, and vegetables while limiting simple carbohydrates.  - Losing 10% or more of adipose tissue may Hemoglobin A1c and fasting insulin .     Hypertension associated with type 2 diabetes mellitus (HCC) Assessment & Plan: Last 3 blood pressure readings in our office are as follows: BP Readings from Last 3 Encounters:  09/17/23 115/76  08/22/23 102/69  07/18/23 105/76    The 10-year ASCVD risk score (Arnett DK, et al., 2019) is: 4.1%  Lab Results  Component Value Date   CREATININE 0.70 05/14/2023   Pt reports compliance with Cozaar  50 mg daily, Amlodipine  10 mg daily, and hydrochlorothiazide  25 mg daily. Last week, pt was concerned that her BP was running low; she was asymptomatic at that time. Over the weekend, her BP has improved to the 120s/80s. BP at goal today. Pt asx.   - Continue medications at current doses.  - Continue low sodium diet, advance exercise as tolerated.  - Check BP at home twice daily.  - Pt instructed to contact cardiology and provide them with her BP's in about a week for further guidance.     Chronic constipation with worsening sx d/t Drug-induced constipation Assessment & Plan: H/o constipation worsened by Mounjaro . Condition currently controlled on Miralax   and OTC Senokot.  - Continue regimen.  - Adequate daily water intake encouraged along with activity/ movement.  - Eat plenty of fiber (goal is over 25 grams each day.     Vitamin D  deficiency Assessment & Plan: Lab Results  Component Value Date   VD25OH 13.0 (L) 05/14/2023   Pt is doing well on Ergocalciferol  50,000 units weekly.  Continue regimen. Recheck periodically.    Follow up:   Return 10/08/2023 at 2:00 PM. She was informed of the importance of frequent follow up visits to maximize her success with intensive lifestyle modifications for her multiple health conditions.   Subjective:   Chief complaint: Obesity [name] is here to discuss her progress with her obesity treatment plan. She is on the Category 2 Plan. She states she is walking 15 minutes 3 days per week .  Interval History:  [name] is here for a follow up office visit. Since last OV on 08/22/2023 , she is down 2 lbs. States her clothes are fitting looser.  States she is following her eating plan approximately 75% of the time. Is not measuring the weight of her proteins after cooking them.  The other 25% of the time she endorses eating off plan for 1 week while she was at vacation in [place].    Pharmacotherapy that aid with weight loss: She is currently taking Mounjaro  5 mg weekly.   Review of Systems:  Pertinent positives were addressed with patient today.  Reviewed by clinician on day of visit: allergies, medications, problem list, medical history, surgical history, family history, social history, and previous encounter notes.  Weight Summary and Biometrics   Weight Lost Since Last Visit: 2lb  Weight Gained Since Last Visit: 0lb   Vitals Temp: 98.2 F (36.8 C) BP: 115/76 Pulse Rate: 85 SpO2: 94 %   Anthropometric Measurements Height: 5' 6 (1.676 m) Weight: 203 lb (92.1 kg) BMI (Calculated): 32.78 Weight at Last Visit: 205lb Weight Lost Since Last Visit: 2lb Weight Gained Since Last Visit: 0lb Starting Weight: 217lb Total Weight Loss (lbs): 14 lb (6.35 kg) Peak Weight: 232lb   Body Composition  Body Fat %: 41.5 % Fat Mass (lbs): 84.4 lbs Muscle Mass (lbs): 112.8  lbs Total Body Water (lbs): 77.2 lbs Visceral Fat Rating : 11   Other Clinical Data Fasting: No Labs: No Today's Visit #: 7 Starting Date: 05/14/23 Comments: Cat 2    Objective:   PHYSICAL EXAM: Blood pressure 115/76, pulse 85, temperature 98.2 F (36.8 C), height 5' 6 (1.676 m), weight 203 lb (92.1 kg), SpO2 94%. Body mass index is 32.77 kg/m.  General: she is overweight, cooperative and in no acute distress. PSYCH: Has normal mood, affect and thought process.   HEENT: EOMI, sclerae are anicteric. Lungs: Normal breathing effort, no conversational dyspnea. Extremities: Moves * 4 Neurologic: A and O * 3, good insight  DIAGNOSTIC DATA REVIEWED: BMET    Component Value Date/Time   NA 139 05/14/2023 1057   K 4.3 05/14/2023 1057   CL 103 05/14/2023 1057   CO2 20 05/14/2023 1057   GLUCOSE 118 (H) 05/14/2023 1057   GLUCOSE 137 (H) 01/16/2023 0845   BUN 9 05/14/2023  1057   CREATININE 0.70 05/14/2023 1057   CREATININE 0.61 01/16/2023 0845   CALCIUM  9.2 05/14/2023 1057   GFRNONAA >60 07/27/2022 1400   GFRNONAA 106 02/16/2020 1144   GFRAA 123 02/16/2020 1144   Lab Results  Component Value Date   HGBA1C 6.7 (H) 05/14/2023   HGBA1C 5.2 05/19/2013   Lab Results  Component Value Date   INSULIN  17.8 05/14/2023   Lab Results  Component Value Date   TSH 1.890 05/14/2023   CBC    Component Value Date/Time   WBC 7.1 05/14/2023 1057   WBC 6.7 03/02/2023 0943   RBC 4.43 05/14/2023 1057   RBC 4.77 03/02/2023 0943   HGB 13.1 05/14/2023 1057   HCT 38.7 05/14/2023 1057   PLT 321 05/14/2023 1057   MCV 87 05/14/2023 1057   MCH 29.6 05/14/2023 1057   MCH 29.4 03/02/2023 0943   MCHC 33.9 05/14/2023 1057   MCHC 33.3 03/02/2023 0943   RDW 12.3 05/14/2023 1057   Iron Studies No results found for: IRON, TIBC, FERRITIN, IRONPCTSAT Lipid Panel     Component Value Date/Time   CHOL 212 (H) 05/14/2023 1057   TRIG 134 05/14/2023 1057   HDL 59 05/14/2023 1057   CHOLHDL 3.6 05/14/2023 1057   CHOLHDL 3.5 01/16/2023 0845   VLDL 26 05/28/2019 0955   LDLCALC 129 (H) 05/14/2023 1057   LDLCALC 125 (H) 01/16/2023 0845   Hepatic Function Panel     Component Value Date/Time   PROT 6.8 06/25/2023 0821   ALBUMIN 4.2 06/25/2023 0821   AST 22 06/25/2023 0821   ALT 25 06/25/2023 0821   ALKPHOS 168 (H) 06/25/2023 0821   BILITOT 0.4 06/25/2023 0821   BILIDIR 0.16 06/25/2023 0821   IBILI 0.3 03/02/2023 0943      Component Value Date/Time   TSH 1.890 05/14/2023 1057   Nutritional Lab Results  Component Value Date   VD25OH 13.0 (L) 05/14/2023    Attestations:   I, Special Puri, acting as a Stage manager for [name] Jenkins, DO., have compiled all relevant documentation for today's office visit on behalf of [name] Jenkins, DO, while in the presence of [employer], DO.  I have spent 42 minutes in the care of the patient today including 35  minutes face-to-face assessing and reviewing listed medical problems above as outlined in office visit note and providing nutritional and behavioral counseling as outlined in obesity care plan.  I have reviewed the above documentation for accuracy and completeness, and I agree with the above. [name]  JINNY [name], D.O.  The 21st Century Cures Act was signed into law in 2016 which includes the topic of electronic health records.  This provides immediate access to information in MyChart.  This includes consultation notes, operative notes, office notes, lab results and pathology reports.  If you have any questions about what you read please let us  know at your next visit so we can discuss your concerns and take corrective action if need be.  We are right here with you.

## 2023-09-25 ENCOUNTER — Ambulatory Visit: Payer: Self-pay

## 2023-09-25 ENCOUNTER — Telehealth: Admitting: Family Medicine

## 2023-09-25 DIAGNOSIS — J069 Acute upper respiratory infection, unspecified: Secondary | ICD-10-CM

## 2023-09-25 MED ORDER — AZITHROMYCIN 250 MG PO TABS
ORAL_TABLET | ORAL | 0 refills | Status: AC
Start: 2023-09-25 — End: 2023-09-30

## 2023-09-25 MED ORDER — PROMETHAZINE-DM 6.25-15 MG/5ML PO SYRP
5.0000 mL | ORAL_SOLUTION | Freq: Four times a day (QID) | ORAL | 0 refills | Status: DC | PRN
Start: 2023-09-25 — End: 2023-10-03

## 2023-09-25 NOTE — Progress Notes (Signed)
 Virtual Visit Consent   GREY SCHLAUCH, you are scheduled for a virtual visit with a [facility] provider today. Just as with appointments in the office, your consent must be obtained to participate. Your consent will be active for this visit and any virtual visit you may have with one of our providers in the next 365 days. If you have a MyChart account, a copy of this consent can be sent to you electronically.  As this is a virtual visit, video technology does not allow for your provider to perform a traditional examination. This may limit your provider's ability to fully assess your condition. If your provider identifies any concerns that need to be evaluated in person or the need to arrange testing (such as labs, EKG, etc.), we will make arrangements to do so. Although advances in technology are sophisticated, we cannot ensure that it will always work on either your end or our end. If the connection with a video visit is poor, the visit may have to be switched to a telephone visit. With either a video or telephone visit, we are not always able to ensure that we have a secure connection.  By engaging in this virtual visit, you consent to the provision of healthcare and authorize for your insurance to be billed (if applicable) for the services provided during this visit. Depending on your insurance coverage, you may receive a charge related to this service.  I need to obtain your verbal consent now. Are you willing to proceed with your visit today? AYNSLEE MULHALL has provided verbal consent on 09/25/2023 for a virtual visit (video or telephone). Loa Lamp, FNP  Date: 09/25/2023 4:33 PM   Virtual Visit via Video Note   I, Loa Lamp, connected with  DINIA JOYNT  ([id number], 06-27-1965) on 09/25/23 at  4:30 PM EDT by a video-enabled telemedicine application and verified that I am speaking with the correct person using two identifiers.  Location: Patient: Virtual Visit Location Patient: Home Provider:  Virtual Visit Location Provider: Home Office   I discussed the limitations of evaluation and management by telemedicine and the availability of in person appointments. The patient expressed understanding and agreed to proceed.    History of Present Illness: HOUA NIE is a 58 y.o. who identifies as a female who was assigned female at birth, and is being seen today for sore throat, headache, cough, head congestion, for 6 days. Sx worsening. Denies wheezing and sob. Started CPAP last week before this started.   HPI: HPI  Problems:  Patient Active Problem List   Diagnosis Date Noted   Hypertension associated with type 2 diabetes mellitus (HCC) 09/17/2023   Obesity, Starting BMI 35.04 08/22/2023   Itching in the vaginal area 06/06/2023   Vaginal dryness 06/06/2023   Yeast infection 06/06/2023   Type 2 diabetes mellitus with obesity (HCC) 05/28/2023   Hypertension associated with diabetes (HCC) 05/28/2023   Hyperlipidemia associated with type 2 diabetes mellitus (HCC) 05/28/2023   Metabolic dysfunction-associated steatotic liver disease (MASLD) 05/28/2023   OSA (obstructive sleep apnea) 05/28/2023   B12 deficiency - new onset 05/28/2023   Vitamin D  deficiency - new onset 05/28/2023   Left foot pain 05/22/2023   Plantar fasciitis 05/22/2023   Current use of estrogen therapy 03/08/2023   Night sweats 12/06/2022   Hot flashes 12/06/2022   S/P hysterectomy 12/06/2022   Encounter for well woman exam with routine gynecological exam 12/06/2022   Encounter for screening fecal occult blood testing 12/06/2022  Pain of right hip joint 02/22/2022   Low back pain 09/21/2021   Pain in joint of left shoulder 07/19/2021   Cervical radiculopathy 07/19/2021   Elevated LFTs 07/04/2021   Fatty liver 07/04/2021   Constipation 07/04/2021   History of colonic polyps 07/04/2021   Migraine without aura and without status migrainosus, not intractable 08/24/2020   History of head injury 08/24/2020    Intractable chronic post-traumatic headache 04/20/2020   History of subarachnoid hemorrhage 04/20/2020   COVID-19 virus infection 12/05/2019   New onset type 2 diabetes mellitus (HCC) 11/12/2019   Subarachnoid hemorrhage following injury (HCC) 11/11/2019   Class 1 obesity due to excess calories with body mass index (BMI) of 34.0 to 34.9 in adult 11/11/2019   Encounter for monitoring postmenopausal estrogen replacement therapy 01/27/2019   Weight loss counseling, encounter for 01/27/2019   Vaginal odor 12/02/2018   Hot flashes due to menopause 12/02/2018   No energy 12/02/2018   Decreased libido 12/02/2018   Counseling for estrogen replacement therapy 12/02/2018   History of seizure 12/02/2018   Encounter for neuropsychological testing    Lumbar stenosis with neurogenic claudication 05/23/2018   Prolapsed lumbar disc 03/14/2018   Lumbar radiculopathy 03/14/2018   Lumbar spondylosis 03/14/2018   Pain in left knee 07/26/2017   Seizures (HCC)    Psychogenic nonepileptic seizure 11/13/2015   Jerking movements of extremities 11/11/2015   Major depressive disorder, recurrent severe without psychotic features (HCC)    Bipolar II disorder (HCC) 08/30/2015   Suicide attempt by drug ingestion (HCC) 08/30/2015   Suicidal ideation 08/30/2015   Migraines 09/29/2014   Migraine 05/19/2013   Left-sided weakness 05/18/2013   Numbness and tingling of left arm and leg 05/18/2013   Cerebral infarction (HCC) 05/18/2013   Hypertension    Anxiety     Allergies:  Allergies  Allergen Reactions   Penicillins Hives    Has patient had a PCN reaction causing immediate rash, facial/tongue/throat swelling, SOB or lightheadedness with hypotension: Yes Has patient had a PCN reaction causing severe rash involving mucus membranes or skin necrosis: No Has patient had a PCN reaction that required hospitalization No Has patient had a PCN reaction occurring within the last 10 years: No If all of the above answers  are NO, then may proceed with Cephalosporin use.     Toradol  [Ketorolac  Tromethamine ] Hives   Hydromorphone      Other reaction(s): Hallucination   Lamotrigine Rash   Medications:  Current Outpatient Medications:    azithromycin  (ZITHROMAX ) 250 MG tablet, Take 2 tablets on day 1, then 1 tablet daily on days 2 through 5, Disp: 6 tablet, Rfl: 0   promethazine -dextromethorphan (PROMETHAZINE -DM) 6.25-15 MG/5ML syrup, Take 5 mLs by mouth 4 (four) times daily as needed for up to 10 days for cough., Disp: 118 mL, Rfl: 0   ACCU-CHEK GUIDE test strip, USE AS DIRECTED TO MONITOR FSBS 1X DAILY. DX: R73.09., Disp: 100 strip, Rfl: 11   amLODipine  (NORVASC ) 10 MG tablet, Take 1 tablet (10 mg total) by mouth daily., Disp: 90 tablet, Rfl: 3   b complex vitamins capsule, With 500mcg b12 daily, Disp: , Rfl:    Blood Glucose Monitoring Suppl (BLOOD GLUCOSE SYSTEM PAK) KIT, Please dispense based on patient and insurance preference. Use as directed to monitor FSBS 1x daily. Dx: R73.09., Disp: 1 kit, Rfl: 1   estradiol  (VIVELLE -DOT) 0.1 MG/24HR patch, Place 1 patch (0.1 mg total) onto the skin 2 (two) times a week., Disp: 8 patch, Rfl: 6  fluconazole  (DIFLUCAN ) 150 MG tablet, Take 1 now and 1 in 3 days, do not with Crestor , Disp: 2 tablet, Rfl: 1   gabapentin  (NEURONTIN ) 300 MG capsule, Take 300 mg by mouth 3 (three) times daily., Disp: , Rfl:    hydrochlorothiazide  (HYDRODIURIL ) 25 MG tablet, Take 1 tablet (25 mg total) by mouth daily., Disp: 90 tablet, Rfl: 3   HYDROcodone -acetaminophen  (NORCO) 10-325 MG tablet, Take 1 tablet by mouth every 4 (four) hours as needed for severe pain., Disp: 40 tablet, Rfl: 0   Lancets MISC, Please dispense based on patient and insurance preference. Use as directed to monitor FSBS 1x daily. Dx: R73.09., Disp: 50 each, Rfl: 0   losartan  (COZAAR ) 50 MG tablet, Take 1 tablet (50 mg total) by mouth daily., Disp: 90 tablet, Rfl: 3   nystatin -triamcinolone  ointment (MYCOLOG), Use bid  for 2 weeks then stop can repeat if necessary, Disp: 30 g, Rfl: 1   ondansetron  (ZOFRAN ) 4 MG tablet, TAKE 1 TABLET BY MOUTH EVERY 8 HOURS AS NEEDED FOR NAUSEA AND VOMITING, Disp: 20 tablet, Rfl: 1   ORACEA  40 MG CPDR, Take 40 mg by mouth daily., Disp: 30 capsule, Rfl: 0   polyethylene glycol powder (GLYCOLAX /MIRALAX ) 17 GM/SCOOP powder, BID until normal stool, Then 1 daily, then prn, Disp: 510 g, Rfl: 0   rosuvastatin  (CRESTOR ) 20 MG tablet, Take 1 tablet (20 mg total) by mouth daily., Disp: 90 tablet, Rfl: 3   senna-docusate (SENOKOT-S) 8.6-50 MG tablet, 1 po bid FOR CONSTIPATION, then once daily, Disp: , Rfl:    tirzepatide  (MOUNJARO ) 5 MG/0.5ML Pen, Inject 5 mg into the skin once a week., Disp: 2 mL, Rfl: 0   venlafaxine  XR (EFFEXOR -XR) 150 MG 24 hr capsule, Take 2 capsules (300 mg total) by mouth daily with breakfast., Disp: 180 capsule, Rfl: 0   Vitamin D , Ergocalciferol , (DRISDOL ) 1.25 MG (50000 UNIT) CAPS capsule, Take 1 capsule (50,000 Units total) by mouth every 7 (seven) days., Disp: 4 capsule, Rfl: 0   zonisamide  (ZONEGRAN ) 100 MG capsule, Take 200mg  daily at bedtime, Disp: 180 capsule, Rfl: 1  Observations/Objective: Patient is well-developed, well-nourished in no acute distress.  Resting comfortably  at home.  Head is normocephalic, atraumatic.  No labored breathing. Speech is clear and coherent with logical content.  Patient is alert and oriented at baseline.   Assessment and Plan: 1. Upper respiratory tract infection, unspecified type (Primary)  Increase fluids, humidifier at night, tylenol  or ibuprofen  as directed, uc if sx persist or worsen.   Follow Up Instructions: I discussed the assessment and treatment plan with the patient. The patient was provided an opportunity to ask questions and all were answered. The patient agreed with the plan and demonstrated an understanding of the instructions.  A copy of instructions were sent to the patient via MyChart unless otherwise  noted below.     The patient was advised to call back or seek an in-person evaluation if the symptoms worsen or if the condition fails to improve as anticipated.    Hayat Warbington, FNP

## 2023-09-25 NOTE — Patient Instructions (Signed)

## 2023-09-25 NOTE — Telephone Encounter (Signed)
 FYI Only or Action Required?: FYI only for provider.  Patient was last seen in primary care on 09/17/2023 by [name] Sober, DO.  Called Nurse Triage reporting Sore Throat.  Symptoms began several days ago.  Interventions attempted: OTC medications: benadryl  and Rest, hydration, or home remedies.  Symptoms are: gradually worsening.  Triage Disposition: See PCP When Office is Open (Within 3 Days)  Patient/caregiver understands and will follow disposition?: Yes        Copied from CRM [id number]. Topic: Clinical - Red Word Triage >> Sep 25, 2023 10:11 AM Tiffini S wrote: [facility] that prompted transfer to Nurse Triage: patient is not feeling well/ statred feeling ill on 09/21/23 with loss of voice, headache, sore throat- painful/ hard to swollen, congestion Reason for Disposition  [1] Sore throat is the only symptom AND [2] present > 48 hours  Answer Assessment - Initial Assessment Questions 1. ONSET: When did the throat start hurting? (Hours or days ago)      Friday 2. SEVERITY: How bad is the sore throat? (Scale 1-10; mild, moderate or severe)     8/10 3. STREP EXPOSURE: Has there been any exposure to strep within the past week? If Yes, ask: What type of contact occurred?      no 4.  VIRAL SYMPTOMS: Are there any symptoms of a cold, such as a runny nose, cough, hoarse voice or red eyes?      Cough, runny nose 5. FEVER: Do you have a fever? If Yes, ask: What is your temperature, how was it measured, and when did it start?     No but had chills 6. PUS ON THE TONSILS: Is there pus on the tonsils in the back of your throat?     no 7. OTHER SYMPTOMS: Do you have any other symptoms? (e.g., difficulty breathing, headache, rash)     Headache, congestion, runny nose, cough  Protocols used: Sore Throat-A-AH

## 2023-10-03 ENCOUNTER — Ambulatory Visit (INDEPENDENT_AMBULATORY_CARE_PROVIDER_SITE_OTHER): Admitting: Gastroenterology

## 2023-10-03 ENCOUNTER — Encounter: Payer: Self-pay | Admitting: Gastroenterology

## 2023-10-03 VITALS — BP 114/67 | HR 83 | Temp 98.7°F | Ht 66.0 in | Wt 209.4 lb

## 2023-10-03 DIAGNOSIS — R7989 Other specified abnormal findings of blood chemistry: Secondary | ICD-10-CM

## 2023-10-03 DIAGNOSIS — K5909 Other constipation: Secondary | ICD-10-CM | POA: Diagnosis not present

## 2023-10-03 DIAGNOSIS — K760 Fatty (change of) liver, not elsewhere classified: Secondary | ICD-10-CM

## 2023-10-03 DIAGNOSIS — K5900 Constipation, unspecified: Secondary | ICD-10-CM

## 2023-10-03 MED ORDER — LUBIPROSTONE 24 MCG PO CAPS: 24.0000 ug | ORAL_CAPSULE | Freq: Two times a day (BID) | ORAL | 5 refills | Status: DC

## 2023-10-03 NOTE — Patient Instructions (Addendum)
 Looks like your insurance pays for lubiprostone  for constipation. Start 24mcg twice daily with food. We do not carry samples since it is generic.   You can stop sennakot and miralax  if lubiprostone  works for you.  Please complete labs at your convenience. We will be in touch with results and recommendations.  If you continue to have issues with constipation, please reach out so we can make adjustments.

## 2023-10-03 NOTE — Progress Notes (Signed)
 GI Office Note    Referring Provider: Duanne Butler DASEN, MD Primary Care Physician:  Duanne Butler DASEN, MD  Primary Gastroenterologist: Ozell Hollingshead, MD   Chief Complaint   Chief Complaint  Patient presents with   Follow-up    Doing well, no issues. Will be having f/u labs that were mailed to her done next week.     History of Present Illness   [name] is a 58 y.o. female presenting todayfor follow up. Last seen in 10/2022. She has h/o chronic constipation, elevated LFTs, fatty liver, adenomatous colon polyps.   Labs 06/2023: -PBC profile- ANA pos with1:160 homogenous pattern, ASMA 1:40, AMA neg, anti-mitochondrial M2 neg, Anti-GP-210 Ab neg, Anti-SP-100 Ab neg - Total bilirubin 0.4, alkaline phosphatase 168 (198), AST 22, ALT 25, albumin 4.2 - Celiac disease HLA DQ was negative  2024, her hepatitis B surface antigen was negative, hepatitis C antibody negative, IgG 8/IgG/IgM all normal, TTG IgA less than 1, actin antibody less than 20, LFTs normal except for alkaline phosphatase of 157.   RUQ U/S 10/2020: diffuse hepatic steatosis with probable fatty sparing along gallbladder fossa.  Today: still working on weight loss. Goes to [facility]. Currently on Mounjaro . Has some nausea but no vomiting. Generally feels like Mounjaro  curbs her appetite and sometimes she does not eat well or feel well. No abdominal pain. BMs worse. Only has 1-2 stools per week. Not productive. Sometimes hard and flares her hemorrhoids. Has had small amount of toilet tissue hematochezia with straining.  Has been trying Senokot and MiraLAX  without significant relief.  Does not take hydrocodone  very often.  Does have some hand needed.  Really has not had much in the way of heartburn.  No dysphagia.  Started using CPAP last week, has noted increased belching.     Wt Readings from Last 20 Encounters:  10/03/23 209 lb 6.4 oz (95 kg)  09/17/23 203 lb (92.1 kg)  08/22/23 205 lb (93 kg)  08/21/23 207 lb (93.9  kg)  08/08/23 207 lb (93.9 kg)  07/18/23 207 lb (93.9 kg)  06/27/23 206 lb (93.4 kg)  06/13/23 210 lb 6.4 oz (95.4 kg)  06/06/23 216 lb (98 kg)  06/04/23 212 lb 10.1 oz (96.4 kg)  05/28/23 217 lb (98.4 kg)  05/22/23 217 lb (98.4 kg)  05/14/23 217 lb (98.4 kg)  05/02/23 216 lb (98 kg)  04/09/23 217 lb 12.8 oz (98.8 kg)  03/08/23 221 lb (100.2 kg)  01/16/23 218 lb (98.9 kg)  12/06/22 221 lb (100.2 kg)  11/28/22 217 lb (98.4 kg)  10/16/22 222 lb 6.4 oz (100.9 kg)    Colonoscopy 07/20/2021: Three 6-8 mm polyps in the descending colon and cecum removed, diverticulosis in the entire colon, otherwise normal exam. Pathology with tubular adenomas. Recommended 5-year surveillance.   Medications   Current Outpatient Medications  Medication Sig Dispense Refill   ACCU-CHEK GUIDE test strip USE AS DIRECTED TO MONITOR FSBS 1X DAILY. DX: R73.09. 100 strip 11   amLODipine  (NORVASC ) 10 MG tablet Take 1 tablet (10 mg total) by mouth daily. 90 tablet 3   b complex vitamins capsule With 500mcg b12 daily     Blood Glucose Monitoring Suppl (BLOOD GLUCOSE SYSTEM PAK) KIT Please dispense based on patient and insurance preference. Use as directed to monitor FSBS 1x daily. Dx: R73.09. 1 kit 1   estradiol  (VIVELLE -DOT) 0.1 MG/24HR patch Place 1 patch (0.1 mg total) onto the skin 2 (two) times a week. 8 patch 6  hydrochlorothiazide  (HYDRODIURIL ) 25 MG tablet Take 1 tablet (25 mg total) by mouth daily. 90 tablet 3   HYDROcodone -acetaminophen  (NORCO) 10-325 MG tablet Take 1 tablet by mouth every 4 (four) hours as needed for severe pain. 40 tablet 0   Lancets MISC Please dispense based on patient and insurance preference. Use as directed to monitor FSBS 1x daily. Dx: R73.09. 50 each 0   losartan  (COZAAR ) 50 MG tablet Take 1 tablet (50 mg total) by mouth daily. 90 tablet 3   nystatin -triamcinolone  ointment (MYCOLOG) Use bid for 2 weeks then stop can repeat if necessary 30 g 1   ondansetron  (ZOFRAN ) 4 MG tablet  TAKE 1 TABLET BY MOUTH EVERY 8 HOURS AS NEEDED FOR NAUSEA AND VOMITING 20 tablet 1   ORACEA  40 MG CPDR Take 40 mg by mouth daily. 30 capsule 0   polyethylene glycol powder (GLYCOLAX /MIRALAX ) 17 GM/SCOOP powder BID until normal stool, Then 1 daily, then prn 510 g 0   promethazine -dextromethorphan (PROMETHAZINE -DM) 6.25-15 MG/5ML syrup Take 5 mLs by mouth 4 (four) times daily as needed for up to 10 days for cough. (Patient not taking: Reported on 10/03/2023) 118 mL 0   rosuvastatin  (CRESTOR ) 20 MG tablet Take 1 tablet (20 mg total) by mouth daily. 90 tablet 3   senna-docusate (SENOKOT-S) 8.6-50 MG tablet 1 po bid FOR CONSTIPATION, then once daily     tirzepatide  (MOUNJARO ) 5 MG/0.5ML Pen Inject 5 mg into the skin once a week. 2 mL 0   venlafaxine  XR (EFFEXOR -XR) 150 MG 24 hr capsule Take 2 capsules (300 mg total) by mouth daily with breakfast. 180 capsule 0   Vitamin D , Ergocalciferol , (DRISDOL ) 1.25 MG (50000 UNIT) CAPS capsule Take 1 capsule (50,000 Units total) by mouth every 7 (seven) days. 4 capsule 0   zonisamide  (ZONEGRAN ) 100 MG capsule Take 200mg  daily at bedtime 180 capsule 1   No current facility-administered medications for this visit.    Allergies   Allergies as of 10/03/2023 - Review Complete 10/03/2023  Allergen Reaction Noted   Penicillins Hives 11/14/2010   Toradol  [ketorolac  tromethamine ] Hives 03/14/2016   Hydromorphone   07/07/2021   Lamotrigine Rash 12/23/2015    Review of Systems   General: Negative for anorexia, weight loss, fever, chills, fatigue, weakness. ENT: Negative for hoarseness, difficulty swallowing , nasal congestion. CV: Negative for chest pain, angina, palpitations, dyspnea on exertion, peripheral edema.  Respiratory: Negative for dyspnea at rest, dyspnea on exertion, cough, sputum, wheezing.  GI: See history of present illness. GU:  Negative for dysuria, hematuria, urinary incontinence, urinary frequency, nocturnal urination.  Endo: Negative for unusual  weight change.     Physical Exam   BP 114/67 (BP Location: Right Arm, Patient Position: Sitting, Cuff Size: Large)   Pulse 83   Temp 98.7 F (37.1 C) (Oral)   Ht 5' 6 (1.676 m)   Wt 209 lb 6.4 oz (95 kg)   SpO2 98%   BMI 33.80 kg/m    General: Well-nourished, well-developed in no acute distress.  Eyes: No icterus. Mouth: Oropharyngeal mucosa moist and pink   Abdomen: Bowel sounds are normal, nontender, nondistended, no hepatosplenomegaly or masses,  no abdominal bruits or hernia , no rebound or guarding.  Rectal: not performed Extremities: No lower extremity edema. No clubbing or deformities. Neuro: Alert and oriented x 4   Skin: Warm and dry, no jaundice.   Psych: Alert and cooperative, normal mood and affect.  Labs   Lab Results  Component Value Date   WBC 7.1  05/14/2023   HGB 13.1 05/14/2023   HCT 38.7 05/14/2023   MCV 87 05/14/2023   PLT 321 05/14/2023   Lab Results  Component Value Date   NA 139 05/14/2023   CL 103 05/14/2023   K 4.3 05/14/2023   CO2 20 05/14/2023   BUN 9 05/14/2023   CREATININE 0.70 05/14/2023   EGFR 102 05/14/2023   CALCIUM  9.2 05/14/2023   PHOS 2.9 11/11/2019   ALBUMIN 4.2 06/25/2023   GLUCOSE 118 (H) 05/14/2023   Lab Results  Component Value Date   TSH 1.890 05/14/2023   Lab Results  Component Value Date   HGBA1C 6.7 (H) 05/14/2023   Lab Results  Component Value Date   VITAMINB12 461 05/14/2023   Lab Results  Component Value Date   FOLATE 4.1 05/14/2023    Imaging Studies   No results found.  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 She is due updated labs at this time. May require MRI/MRCP to rule out bile duct disease such as PSC. May consider elastography depending on fibrosis scores by labs.  -complete labs -continue weight loss efforts and diabetes management efforts.   -refrain from regular alcohol use -daily exercise -return ov in one year  Constipation: chronic in nature exacerbated by GLP-1 use.  Minimal improvement with Senokot, MiraLAX  - Trial of lubiprostone  24 mcg twice daily with meals.  May stop over-the-counter bowel regimen if lubiprostone  effective. - Encouraged adequate dietary fiber, fluid intake, physical activity - If constipation adequately managed and she continues to have rectal discomfort and toilet tissue hematochezia (suspected due to hemorrhoids), she will let me know.  Colonoscopy 58 years old at this point. If ongoing symptoms, consider updating.      Sonny RAMAN. Ezzard, MHS, PA-C [facility]

## 2023-10-08 ENCOUNTER — Encounter (INDEPENDENT_AMBULATORY_CARE_PROVIDER_SITE_OTHER): Payer: Self-pay | Admitting: Family Medicine

## 2023-10-08 ENCOUNTER — Ambulatory Visit (INDEPENDENT_AMBULATORY_CARE_PROVIDER_SITE_OTHER): Admitting: Family Medicine

## 2023-10-08 VITALS — BP 117/82 | HR 82 | Temp 97.8°F | Ht 66.0 in | Wt 206.0 lb

## 2023-10-08 DIAGNOSIS — Z7985 Long-term (current) use of injectable non-insulin antidiabetic drugs: Secondary | ICD-10-CM

## 2023-10-08 DIAGNOSIS — E1169 Type 2 diabetes mellitus with other specified complication: Secondary | ICD-10-CM | POA: Diagnosis not present

## 2023-10-08 DIAGNOSIS — E559 Vitamin D deficiency, unspecified: Secondary | ICD-10-CM | POA: Diagnosis not present

## 2023-10-08 DIAGNOSIS — Z6833 Body mass index (BMI) 33.0-33.9, adult: Secondary | ICD-10-CM | POA: Diagnosis not present

## 2023-10-08 DIAGNOSIS — E669 Obesity, unspecified: Secondary | ICD-10-CM | POA: Diagnosis not present

## 2023-10-08 MED ORDER — VITAMIN D (ERGOCALCIFEROL) 1.25 MG (50000 UNIT) PO CAPS: 50000.0000 [IU] | ORAL_CAPSULE | ORAL | 0 refills | Status: DC

## 2023-10-08 MED ORDER — TIRZEPATIDE 5 MG/0.5ML ~~LOC~~ SOAJ: 5.0000 mg | SUBCUTANEOUS | 0 refills | Status: DC

## 2023-10-08 NOTE — Progress Notes (Signed)
 [name], D.O.  ABFM, ABOM Specializing in Clinical Bariatric Medicine  Office located at: [address] [place], [place]  [address]   Assessment and Plan:   Orders Placed This Encounter  Procedures   VITAMIN D  25 Hydroxy (Vit-D Deficiency, Fractures)   Hemoglobin A1c    There are no discontinued medications.   No orders of the defined types were placed in this encounter.    FOR THE DISEASE OF OBESITY:  BMI 33.0-33.9,adult - current BMI 33.25 Obesity, Starting BMI 35.04 Assessment & Plan: Since last office visit on 09/17/2023 patient's muscle mass has decreased by 3.8 lbs. Fat mass has increased by 7.6 lbs. Total body water has decreased by 0.4 lbs.  Body fat % has increased by 3 %. Counseling done on how various foods will affect these numbers and how to maximize success  Total lbs lost to date: - 11 lbs Total weight loss percentage to date: -5.07 %   Recommended Dietary Goals [name] is currently in the action stage of change. As such, her goal is to continue weight management plan.  She was strongly encouraged to journal 1300-1400 cal and 85+ grams protein with the Cat 2 meal plan as a guide.   Behavioral Intervention We discussed the following today: increasing lean protein intake to established goals, decreasing simple carbohydrates , work on tracking and journaling calories using tracking application, and continue to work on implementation of reduced calorie nutritional plan  Additional resources provided today: Physician provided patient with handouts and personalized instruction on tracking and journaling using Apps (or how to handwrite in notebook) and using logs provided   Evidence-based interventions for health behavior change were utilized today including the discussion of self monitoring techniques, problem-solving barriers and SMART goal setting techniques.   Regarding patient's less desirable eating habits and patterns, we employed the technique  of small changes.   Pt will specifically work on: n/a   Recommended Physical Activity Goals [name] has been advised to work up to 300-450 minutes of moderate intensity aerobic activity a week and strengthening exercises 2-3 times per week for cardiovascular health, weight loss maintenance and preservation of muscle mass.   She has agreed to: continue to gradually increase the amount and intensity of exercise routine   Pharmacotherapy See T2DM note.   ASSOCIATED CONDITIONS ADDRESSED TODAY:   Type 2 diabetes mellitus with obesity (HCC) Assessment & Plan: Lab Results  Component Value Date   HGBA1C 6.7 (H) 05/14/2023   HGBA1C 6.6 (H) 01/16/2023   HGBA1C 6.2 (H) 07/27/2022   INSULIN  17.8 05/14/2023   HgbA1c is not at goal for age and comorbid conditions. Denies symptoms of hypoglycemia or hyperglycemia. Reports compliance with Mounjaro  5 mg weekly. Constipation controlled on Amitiza  24 mcg twice daily. States she has been experiencing tolerable nausea for a while but not a daily basis on the Mounjaro  especially when under-eating or eating off plan. She is taking Zofran  prn for the nausea. She denies excessive hunger and cravings and in fact has a hard time getting in all her foods.   Does not wish to change regimen ; she will focous on getting in her meals and eating more on plan   -  - Recheck Hemoglobin A1c today. - Continue to decrease simple carbs/ sugars; increase fiber and proteins -> pt encouraged to journal her intake/adhere to her meal plan.  - Losing 10% or more of adipose tissue may improve condition.      Vitamin D  deficiency Assessment &  Plan: Lab Results  Component Value Date   VD25OH 13.0 (L) 05/14/2023   Currently on Ergocalciferol  50,000 units weekly without any adverse effects such as nausea, vomiting or muscle weakness. No acute concerns.   - Continue vitamin D  supplementation. - Recheck levels today.     Follow up:   Return 10/22/2023 11:20 AM.  She  was informed of the importance of frequent follow up visits to maximize her success with intensive lifestyle modifications for her multiple health conditions.  [name] is aware that we will review all of her lab results at our next visit together in person.  She is aware that if anything is critical/ life threatening with the results, we will be contacting her via MyChart or by my CMA will be calling them prior to the office visit to discuss acute management.     Subjective:   Chief complaint: Obesity [name] is here to discuss her progress with her obesity treatment plan. She is on the Category 2 Plan and states she is following her eating plan approximately 70-80% of the time. She states she is stretching 20 minutes 3 days per week .   Interval History:  [name] is here for a follow up office visit. Since last OV on 09/17/2023 , she is up 3 lbs. Food wise, she acknowledges eating off plan foods and not getting in the recommended amounts of protein. She is open to journaling her intake for mindfulness/awareness.    Pharmacotherapy that aid with weight loss: She is currently taking Mounjaro  5 mg weekly.     Review of Systems:  Pertinent positives were addressed with patient today.  Reviewed by clinician on day of visit: allergies, medications, problem list, medical history, surgical history, family history, social history, and previous encounter notes.  Weight Summary and Biometrics   Weight Lost Since Last Visit: 0  Weight Gained Since Last Visit: 3lb   Vitals Temp: 97.8 F (36.6 C) BP: 117/82 Pulse Rate: 82 SpO2: 99 %   Anthropometric Measurements Height: 5' 6 (1.676 m) Weight: 206 lb (93.4 kg) BMI (Calculated): 33.27 Weight at Last Visit: 203lb Weight Lost Since Last Visit: 0 Weight Gained Since Last Visit: 3lb Starting Weight: 217lb Total Weight Loss (lbs): 11 lb (4.99 kg) Peak Weight: 232lb   Body Composition  Body Fat %: 44.5 % Fat Mass (lbs): 92  lbs Muscle Mass (lbs): 109 lbs Total Body Water (lbs): 76.8 lbs Visceral Fat Rating : 12   Other Clinical Data Fasting: no Labs: no Today's Visit #: 8 Starting Date: 05/14/23    Objective:   PHYSICAL EXAM: Blood pressure 117/82, pulse 82, temperature 97.8 F (36.6 C), height 5' 6 (1.676 m), weight 206 lb (93.4 kg), SpO2 99%. Body mass index is 33.25 kg/m.  General: she is overweight, cooperative and in no acute distress. PSYCH: Has normal mood, affect and thought process.   HEENT: EOMI, sclerae are anicteric. Lungs: Normal breathing effort, no conversational dyspnea. Extremities: Moves * 4 Neurologic: A and O * 3, good insight  DIAGNOSTIC DATA REVIEWED: BMET    Component Value Date/Time   NA 139 05/14/2023 1057   K 4.3 05/14/2023 1057   CL 103 05/14/2023 1057   CO2 20 05/14/2023 1057   GLUCOSE 118 (H) 05/14/2023 1057   GLUCOSE 137 (H) 01/16/2023 0845   BUN 9 05/14/2023 1057   CREATININE 0.70 05/14/2023 1057   CREATININE 0.61 01/16/2023 0845   CALCIUM  9.2 05/14/2023 1057   GFRNONAA >60 07/27/2022 1400  GFRNONAA 106 02/16/2020 1144   GFRAA 123 02/16/2020 1144   Lab Results  Component Value Date   HGBA1C 6.7 (H) 05/14/2023   HGBA1C 5.2 05/19/2013   Lab Results  Component Value Date   INSULIN  17.8 05/14/2023   Lab Results  Component Value Date   TSH 1.890 05/14/2023   CBC    Component Value Date/Time   WBC 7.1 05/14/2023 1057   WBC 6.7 03/02/2023 0943   RBC 4.43 05/14/2023 1057   RBC 4.77 03/02/2023 0943   HGB 13.1 05/14/2023 1057   HCT 38.7 05/14/2023 1057   PLT 321 05/14/2023 1057   MCV 87 05/14/2023 1057   MCH 29.6 05/14/2023 1057   MCH 29.4 03/02/2023 0943   MCHC 33.9 05/14/2023 1057   MCHC 33.3 03/02/2023 0943   RDW 12.3 05/14/2023 1057   Iron Studies No results found for: IRON, TIBC, FERRITIN, IRONPCTSAT Lipid Panel     Component Value Date/Time   CHOL 212 (H) 05/14/2023 1057   TRIG 134 05/14/2023 1057   HDL 59 05/14/2023  1057   CHOLHDL 3.6 05/14/2023 1057   CHOLHDL 3.5 01/16/2023 0845   VLDL 26 05/28/2019 0955   LDLCALC 129 (H) 05/14/2023 1057   LDLCALC 125 (H) 01/16/2023 0845   Hepatic Function Panel     Component Value Date/Time   PROT 6.8 06/25/2023 0821   ALBUMIN 4.2 06/25/2023 0821   AST 22 06/25/2023 0821   ALT 25 06/25/2023 0821   ALKPHOS 168 (H) 06/25/2023 0821   BILITOT 0.4 06/25/2023 0821   BILIDIR 0.16 06/25/2023 0821   IBILI 0.3 03/02/2023 0943      Component Value Date/Time   TSH 1.890 05/14/2023 1057   Nutritional Lab Results  Component Value Date   VD25OH 13.0 (L) 05/14/2023    Attestations:   I, Special Puri, acting as a Stage manager for [employer], DO., have compiled all relevant documentation for today's office visit on behalf of [name] Jenkins, DO, while in the presence of [employer], DO.  I have spent *** minutes in the care of the patient today including *** minutes face-to-face assessing and reviewing listed medical problems above as outlined in office visit note and providing nutritional and behavioral counseling as outlined in obesity care plan.   I have reviewed the above documentation for accuracy and completeness, and I agree with the above. [name] JINNY [name], D.O.  The 21st Century Cures Act was signed into law in 2016 which includes the topic of electronic health records.  This provides immediate access to information in MyChart.  This includes consultation notes, operative notes, office notes, lab results and pathology reports.  If you have any questions about what you read please let us  know at your next visit so we can discuss your concerns and take corrective action if need be.  We are right here with you.

## 2023-10-09 LAB — VITAMIN D 25 HYDROXY (VIT D DEFICIENCY, FRACTURES): Vit D, 25-Hydroxy: 40.3 ng/mL (ref 30.0–100.0)

## 2023-10-09 LAB — HEMOGLOBIN A1C
Est. average glucose Bld gHb Est-mCnc: 111 mg/dL
Hgb A1c MFr Bld: 5.5 % (ref 4.8–5.6)

## 2023-10-10 ENCOUNTER — Ambulatory Visit: Admitting: Gastroenterology

## 2023-10-14 LAB — CBC WITH DIFFERENTIAL/PLATELET
Basophils Absolute: 0.1 x10E3/uL (ref 0.0–0.2)
Basos: 1 %
EOS (ABSOLUTE): 0.2 x10E3/uL (ref 0.0–0.4)
Eos: 2 %
Hematocrit: 42.4 % (ref 34.0–46.6)
Hemoglobin: 13.4 g/dL (ref 11.1–15.9)
Immature Grans (Abs): 0 x10E3/uL (ref 0.0–0.1)
Immature Granulocytes: 0 %
Lymphocytes Absolute: 2.7 x10E3/uL (ref 0.7–3.1)
Lymphs: 27 %
MCH: 28.3 pg (ref 26.6–33.0)
MCHC: 31.6 g/dL (ref 31.5–35.7)
MCV: 90 fL (ref 79–97)
Monocytes Absolute: 0.5 x10E3/uL (ref 0.1–0.9)
Monocytes: 5 %
Neutrophils Absolute: 6.5 x10E3/uL (ref 1.4–7.0)
Neutrophils: 65 %
Platelets: 328 x10E3/uL (ref 150–450)
RBC: 4.73 x10E6/uL (ref 3.77–5.28)
RDW: 13.1 % (ref 11.7–15.4)
WBC: 9.9 x10E3/uL (ref 3.4–10.8)

## 2023-10-14 LAB — ALKALINE PHOSPHATASE, ISOENZYMES
BONE FRACTION: 23 % (ref 14–68)
INTESTINAL FRAC.: 0 % (ref 0–18)
LIVER FRACTION: 77 % (ref 18–85)

## 2023-10-14 LAB — COMPREHENSIVE METABOLIC PANEL WITH GFR
ALT: 18 IU/L (ref 0–32)
AST: 14 IU/L (ref 0–40)
Albumin: 4.4 g/dL (ref 3.8–4.9)
Alkaline Phosphatase: 168 IU/L — ABNORMAL HIGH (ref 44–121)
BUN/Creatinine Ratio: 20 (ref 9–23)
BUN: 14 mg/dL (ref 6–24)
Bilirubin Total: 0.4 mg/dL (ref 0.0–1.2)
CO2: 22 mmol/L (ref 20–29)
Calcium: 9.4 mg/dL (ref 8.7–10.2)
Chloride: 104 mmol/L (ref 96–106)
Creatinine, Ser: 0.71 mg/dL (ref 0.57–1.00)
Globulin, Total: 2.7 g/dL (ref 1.5–4.5)
Glucose: 97 mg/dL (ref 70–99)
Potassium: 4.9 mmol/L (ref 3.5–5.2)
Sodium: 139 mmol/L (ref 134–144)
Total Protein: 7.1 g/dL (ref 6.0–8.5)
eGFR: 100 mL/min/1.73 (ref >59)

## 2023-10-18 ENCOUNTER — Other Ambulatory Visit: Payer: Self-pay | Admitting: *Deleted

## 2023-10-18 ENCOUNTER — Ambulatory Visit: Payer: Self-pay | Admitting: Gastroenterology

## 2023-10-18 DIAGNOSIS — R7989 Other specified abnormal findings of blood chemistry: Secondary | ICD-10-CM

## 2023-10-22 ENCOUNTER — Ambulatory Visit (INDEPENDENT_AMBULATORY_CARE_PROVIDER_SITE_OTHER): Admitting: Family Medicine

## 2023-10-24 ENCOUNTER — Other Ambulatory Visit: Payer: Self-pay | Admitting: Gastroenterology

## 2023-10-24 ENCOUNTER — Ambulatory Visit (HOSPITAL_COMMUNITY)
Admission: RE | Admit: 2023-10-24 | Discharge: 2023-10-24 | Disposition: A | Discharge status: Home / Self Care | Source: Ambulatory Visit | Attending: Gastroenterology | Admitting: Gastroenterology

## 2023-10-24 DIAGNOSIS — R7989 Other specified abnormal findings of blood chemistry: Secondary | ICD-10-CM | POA: Diagnosis present

## 2023-10-24 MED ORDER — GADOBUTROL 1 MMOL/ML IV SOLN
9.0000 mL | Freq: Once | INTRAVENOUS | Status: AC | PRN
  Administered 2023-10-24: 9 mL via INTRAVENOUS

## 2023-11-02 ENCOUNTER — Ambulatory Visit: Payer: Self-pay | Admitting: Gastroenterology

## 2023-11-14 ENCOUNTER — Ambulatory Visit (INDEPENDENT_AMBULATORY_CARE_PROVIDER_SITE_OTHER): Admitting: Family Medicine

## 2023-12-02 NOTE — Progress Notes (Unsigned)
 This encounter was created in error - please disregard.

## 2023-12-03 ENCOUNTER — Telehealth: Payer: Self-pay

## 2023-12-03 ENCOUNTER — Ambulatory Visit: Admitting: Cardiology

## 2023-12-03 VITALS — Ht 66.0 in | Wt 211.0 lb

## 2023-12-03 DIAGNOSIS — G4733 Obstructive sleep apnea (adult) (pediatric): Secondary | ICD-10-CM

## 2023-12-03 DIAGNOSIS — I10 Essential (primary) hypertension: Secondary | ICD-10-CM

## 2023-12-03 NOTE — Telephone Encounter (Signed)
 [name]

## 2023-12-03 NOTE — Patient Instructions (Signed)
 Below is our plan:  We will continue zonisamide  200mg  at bedtime and abortive therapy per your PCP. Continue working on healtjy lifestyle habits. We can consider Ajovy if needed.   Please make sure you are staying well hydrated. I recommend 50-60 ounces daily. Well balanced diet and regular exercise encouraged. Consistent sleep schedule with 6-8 hours recommended.   Please continue follow up with care team as directed.   Follow up with me in 6 months   You may receive a survey regarding today's visit. I encourage you to leave honest feed back as I do use this information to improve patient care. Thank you for seeing me today!   GENERAL HEADACHE INFORMATION:   Natural supplements: Magnesium  Oxide or Magnesium  Glycinate 500 mg at bed (up to 800 mg daily) Coenzyme Q10 300 mg in AM Vitamin B2- 200 mg twice a day   Add 1 supplement at a time since even natural supplements can have undesirable side effects. You can sometimes buy supplements cheaper (especially Coenzyme Q10) at www.WebmailGuide.co.za or at [facility].  Migraine with aura: There is increased risk for stroke in women with migraine with aura and a contraindication for the combined contraceptive pill for use by women who have migraine with aura. The risk for women with migraine without aura is lower. However other risk factors like smoking are far more likely to increase stroke risk than migraine. There is a recommendation for no smoking and for the use of OCPs without estrogen such as progestogen only pills particularly for women with migraine with aura.SABRA People who have migraine headaches with auras may be 3 times more likely to have a stroke caused by a blood clot, compared to migraine patients who don't see auras. Women who take hormone-replacement therapy may be 30 percent more likely to suffer a clot-based stroke than women not taking medication containing estrogen. Other risk factors like smoking and high blood pressure may be  much more  important.    Vitamins and herbs that show potential:   Magnesium : Magnesium  (250 mg twice a day or 500 mg at bed) has a relaxant effect on smooth muscles such as blood vessels. Individuals suffering from frequent or daily headache usually have low magnesium  levels which can be increase with daily supplementation of 400-750 mg. Three trials found 40-90% average headache reduction  when used as a preventative. Magnesium  may help with headaches are aura, the best evidence for magnesium  is for migraine with aura is its thought to stop the cortical spreading depression we believe is the pathophysiology of migraine aura.Magnesium  also demonstrated the benefit in menstrually related migraine.  Magnesium  is part of the messenger system in the serotonin cascade and it is a good muscle relaxant.  It is also useful for constipation which can be a side effect of other medications used to treat migraine. Good sources include nuts, whole grains, and tomatoes. Side Effects: loose stool/diarrhea  Riboflavin (vitamin B 2) 200 mg twice a day. This vitamin assists nerve cells in the production of ATP a principal energy storing molecule.  It is necessary for many chemical reactions in the body.  There have been at least 3 clinical trials of riboflavin using 400 mg per day all of which suggested that migraine frequency can be decreased.  All 3 trials showed significant improvement in over half of migraine sufferers.  The supplement is found in bread, cereal, milk, meat, and poultry.  Most Americans get more riboflavin than the recommended daily allowance, however riboflavin deficiency is not  necessary for the supplements to help prevent headache. Side effects: energizing, green urine   Coenzyme Q10: This is present in almost all cells in the body and is critical component for the conversion of energy.  Recent studies have shown that a nutritional supplement of CoQ10 can reduce the frequency of migraine attacks by improving the  energy production of cells as with riboflavin.  Doses of 150 mg twice a day have been shown to be effective.   Melatonin: Increasing evidence shows correlation between melatonin secretion and headache conditions.  Melatonin supplementation has decreased headache intensity and duration.  It is widely used as a sleep aid.  Sleep is natures way of dealing with migraine.  A dose of 3 mg is recommended to start for headaches including cluster headache. Higher doses up to 15 mg has been reviewed for use in Cluster headache and have been used. The rationale behind using melatonin for cluster is that many theories regarding the cause of Cluster headache center around the disruption of the normal circadian rhythm in the brain.  This helps restore the normal circadian rhythm.   HEADACHE DIET: Foods and beverages which may trigger migraine Note that only 20% of headache patients are food sensitive. You will know if you are food sensitive if you get a headache consistently 20 minutes to 2 hours after eating a certain food. Only cut out a food if it causes headaches, otherwise you might remove foods you enjoy! What matters most for diet is to eat a well balanced healthy diet full of vegetables and low fat protein, and to not miss meals.   Chocolate, other sweets ALL cheeses except cottage and cream cheese Dairy products, yogurt, sour cream, ice cream Liver Meat extracts (Bovril, Marmite, meat tenderizers) Meats or fish which have undergone aging, fermenting, pickling or smoking. These include: Hotdogs,salami,Lox,sausage, mortadellas,smoked salmon, pepperoni, Pickled herring Pods of broad bean (English beans, Chinese pea pods, [place] (fava) beans, lima and navy beans Ripe avocado, ripe banana Yeast extracts or active yeast preparations such as Brewer's or Fleishman's (commercial bakes goods are permitted) Tomato based foods, pizza (lasagna, etc.)   MSG (monosodium glutamate) is disguised as many things; look  for these common aliases: Monopotassium glutamate Autolysed yeast Hydrolysed protein Sodium caseinate "flavorings" "all natural preservatives Nutrasweet   Avoid all other foods that convincingly provoke headaches.   Resources: The Dizzy Bluford Aid Your Headache Diet, migrainestrong.com  https://zamora-andrews.com/   Caffeine  and Migraine For patients that have migraine, caffeine  intake more than 3 days per week can lead to dependency and increased migraine frequency. I would recommend cutting back on your caffeine  intake as best you can. The recommended amount of caffeine  is 200-300 mg daily, although migraine patients may experience dependency at even lower doses. While you may notice an increase in headache temporarily, cutting back will be helpful for headaches in the long run. For more information on caffeine  and migraine, visit: https://americanmigrainefoundation.org/resource-library/caffeine -and-migraine/   Headache Prevention Strategies:   1. Maintain a headache diary; learn to identify and avoid triggers.  - This can be a simple note where you log when you had a headache, associated symptoms, and medications used - There are several smartphone apps developed to help track migraines: Migraine Buddy, Migraine Monitor, Curelator N1-Headache App   Common triggers include: Emotional triggers: Emotional/Upset family or friends Emotional/Upset occupation Business reversal/success Anticipation anxiety Crisis-serious Post-crisis periodNew job/position   Physical triggers: Vacation Day Weekend Strenuous Exercise High Altitude Location New Move Menstrual Day Physical Illness Oversleep/Not enough sleep  Weather changes Light: Photophobia or light sesnitivity treatment involves a balance between desensitization and reduction in overly strong input. Use dark polarized glasses outside, but not inside. Avoid bright or fluorescent light,  but do not dim environment to the point that going into a normally lit room hurts. Consider FL-41 tint lenses, which reduce the most irritating wavelengths without blocking too much light.  These can be obtained at axonoptics.com or theraspecs.com Foods: see list above.   2. Limit use of acute treatments (over-the-counter medications, triptans, etc.) to no more than 2 days per week or 10 days per month to prevent medication overuse headache (rebound headache).     3. Follow a regular schedule (including weekends and holidays): Don't skip meals. Eat a balanced diet. 8 hours of sleep nightly. Minimize stress. Exercise 30 minutes per day. Being overweight is associated with a 5 times increased risk of chronic migraine. Keep well hydrated and drink 6-8 glasses of water per day.   4. Initiate non-pharmacologic measures at the earliest onset of your headache. Rest and quiet environment. Relax and reduce stress. Breathe2Relax is a free app that can instruct you on    some simple relaxtion and breathing techniques. Http://Dawnbuse.com is a    free website that provides teaching videos on relaxation.  Also, there are  many apps that   can be downloaded for "mindful" relaxation.  An app called YOGA NIDRA will help walk you through mindfulness. Another app called Calm can be downloaded to give you a structured mindfulness guide with daily reminders and skill development. Headspace for guided meditation Mindfulness Based Stress Reduction Online Course: www.palousemindfulness.com Cold compresses.   5. Don't wait!! Take the maximum allowable dosage of prescribed medication at the first sign of migraine.   6. Compliance:  Take prescribed medication regularly as directed and at the first sign of a migraine.   7. Communicate:  Call your physician when problems arise, especially if your headaches change, increase in frequency/severity, or become associated with neurological symptoms (weakness, numbness, slurred  speech, etc.). Proceed to emergency room if you experience new or worsening symptoms or symptoms do not resolve, if you have new neurologic symptoms or if headache is severe, or for any concerning symptom.   8. Headache/pain management therapies: Consider various complementary methods, including medication, behavioral therapy, psychological counselling, biofeedback, massage therapy, acupuncture, dry needling, and other modalities.  Such measures may reduce the need for medications. Counseling for pain management, where patients learn to function and ignore/minimize their pain, seems to work very well.   9. Recommend changing family's attention and focus away from patient's headaches. Instead, emphasize daily activities. If first question of day is 'How are your headaches/Do you have a headache today?', then patient will constantly think about headaches, thus making them worse. Goal is to re-direct attention away from headaches, toward daily activities and other distractions.   10. Helpful Websites: www.AmericanHeadacheSociety.org PatentHood.ch www.headaches.org TightMarket.nl www.achenet.org
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
## 2023-12-04 ENCOUNTER — Encounter: Payer: Self-pay | Admitting: Cardiology

## 2023-12-04 ENCOUNTER — Ambulatory Visit (INDEPENDENT_AMBULATORY_CARE_PROVIDER_SITE_OTHER): Payer: Medicare Other | Admitting: Family Medicine

## 2023-12-04 ENCOUNTER — Encounter: Payer: Self-pay | Admitting: Family Medicine

## 2023-12-04 ENCOUNTER — Ambulatory Visit: Attending: Cardiology | Admitting: Cardiology

## 2023-12-04 VITALS — BP 128/80 | HR 53 | Ht 66.5 in | Wt 212.0 lb

## 2023-12-04 VITALS — BP 160/110 | HR 62 | Ht 66.5 in | Wt 212.5 lb

## 2023-12-04 DIAGNOSIS — G43009 Migraine without aura, not intractable, without status migrainosus: Secondary | ICD-10-CM

## 2023-12-04 DIAGNOSIS — I10 Essential (primary) hypertension: Secondary | ICD-10-CM

## 2023-12-04 DIAGNOSIS — G4733 Obstructive sleep apnea (adult) (pediatric): Secondary | ICD-10-CM

## 2023-12-04 DIAGNOSIS — R4189 Other symptoms and signs involving cognitive functions and awareness: Secondary | ICD-10-CM

## 2023-12-04 DIAGNOSIS — F445 Conversion disorder with seizures or convulsions: Secondary | ICD-10-CM

## 2023-12-04 MED ORDER — ZONISAMIDE 100 MG PO CAPS: ORAL_CAPSULE | ORAL | 3 refills | Status: AC

## 2023-12-04 NOTE — Patient Instructions (Signed)
 Medication Instructions:  Your physician recommends that you continue on your current medications as directed. Please refer to the Current Medication list given to you today.  *If you need a refill on your cardiac medications before your next appointment, please call your pharmacy*  Lab Work: none If you have labs (blood work) drawn today and your tests are completely normal, you will receive your results only by: MyChart Message (if you have MyChart) OR A paper copy in the mail If you have any lab test that is abnormal or we need to change your treatment, we will call you to review the results.  Testing/Procedures: none  Follow-Up: At [facility], you and your health needs are our priority.  As part of our continuing mission to provide you with exceptional heart care, our providers are all part of one team.  This team includes your primary Cardiologist (physician) and Advanced Practice Providers or APPs (Physician Assistants and Nurse Practitioners) who all work together to provide you with the care you need, when you need it.  Your next appointment:   12 month(s)  Provider:   Dr Shlomo   We recommend signing up for the patient portal called MyChart.  Sign up information is provided on this After Visit Summary.  MyChart is used to connect with patients for Virtual Visits (Telemedicine).  Patients are able to view lab/test results, encounter notes, upcoming appointments, etc.  Non-urgent messages can be sent to your provider as well.   To learn more about what you can do with MyChart, go to [email or web address].   Other Instructions

## 2023-12-04 NOTE — Progress Notes (Signed)
 Sleep Medicine Consult  Date:  12/04/2023   ID:  [name], DOB [birth year], MRN [id number] The patient was identified using 2 identifiers.  PCP:  Duanne Butler DASEN, MD    [facility] Providers Cardiologist: Vina Gull, MD  Evaluation Performed:  New Patient Evaluation  Chief Complaint: Obstructive sleep apnea  History of Present Illness:    [name] is a 58 y.o. female who is being seen today for the evaluation of obstructive sleep apnea at the request of Vina Gull, MD, MD.  [name] is a 58 y.o. female with a history of anxiety, bipolar disorder, depression, diabetes 2, GERD, hypertension, hyperlipidemia, seizure disorder.  She was seen by Kate Minus, NP on 05/02/2023 and due to poorly controlled blood pressure and it [place] home sleep study was ordered.  This showed moderate obstructive sleep apnea with an AHI of 21.3/h and no central events.  Her supine AHI was 31.3/hr.  O2 saturations were as low as 86%.  There was mild to moderate snoring.  She underwent CPAP titration and was titrated to 15 cm H2O with a small ResMed AirFit F10 fullface mask.  She ultimately was increased to CPAP at 15cm H2O. She is now referred for sleep medicine consultation for treatment of obstructive sleep apnea.  She is doing well with her PAP device and thinks that she has gotten used to it.  She tolerates the full face mask and feels the pressure is adequate.  She says that when she has a good fit it starts to leak and air comes out the side. She has not tried any other masks.  Since going on PAP she feels rested in the am and has less daytime sleepiness.  She denies any significant nasal congestion.  She does have mouth and nose dryness. This has improved with increasing the humidity. She does not think that she snores.  Patient denies any episodes of bruxism, restless legs, no hyponogognic hallucinations or cataplectic events.     Past Medical History:  Diagnosis Date   Anxiety     Anxiety    Back pain    Bipolar 1 disorder (HCC)    Bipolar disorder (HCC)    Chest pain    Constipation    Depression    Depression    Diabetes (HCC)    Diabetes mellitus without complication (HCC)    Type 2   Dyspnea    r/t anxiety   Encounter for neuropsychological testing    at [facility] 3/20-suggest possible somatization   Fatty liver    GERD (gastroesophageal reflux disease)    Headache    High blood pressure    High cholesterol    Hypertension    Joint pain    Migraines    OSA on CPAP    moderate obstructive sleep apnea with an AHI of 21.3/h and no central events.  Her supine AHI was 31.3/hr.  O2 saturations were as low as 86%..  On CPAP at 15cm H2O   Pseudoseizure    Last seizure March/April 2024   PTSD (post-traumatic stress disorder)    Rheumatoid arthritis (HCC)    Rosacea    Seizures (HCC)    [facility] (Dr. 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 1990s   ANTERIOR LAT LUMBAR FUSION N/A 08/03/2022   Procedure: Lumbar two-three extreme Lateral Interbody Fusion;  Surgeon: Colon Shove, MD;  Location: [facility] OR;  Service: Neurosurgery;  Laterality: N/A;   APPENDECTOMY     BACK SURGERY     x2   CHOLECYSTECTOMY     COLONOSCOPY WITH PROPOFOL  N/A 07/20/2021   Surgeon: Shaaron Lamar HERO, MD;  Three 6-8 mm polyps in the descending colon and cecum removed, diverticulosis in the entire colon, otherwise normal exam.  Pathology with tubular adenomas.  Recommended 5-year surveillance.   CYST EXCISION     on ovaries   lumbar     ruptured disc in lumbar taken out   POLYPECTOMY  07/20/2021   Procedure: POLYPECTOMY;  Surgeon: Shaaron Lamar HERO, MD;  Location: [facility];  Service: Endoscopy;;   ROTATOR CUFF REPAIR Left     TONSILLECTOMY     TUBAL LIGATION       Current Meds  Medication Sig   ACCU-CHEK GUIDE test strip USE AS DIRECTED TO MONITOR FSBS 1X DAILY. DX: R73.09.   amLODipine  (NORVASC ) 10 MG tablet Take 1 tablet (10 mg total) by mouth daily.   b complex vitamins capsule With 500mcg b12 daily   Blood Glucose Monitoring Suppl (BLOOD GLUCOSE SYSTEM PAK) KIT Please dispense based on patient and insurance preference. Use as directed to monitor FSBS 1x daily. Dx: R73.09.   estradiol  (VIVELLE -DOT) 0.1 MG/24HR patch Place 1 patch (0.1 mg total) onto the skin 2 (two) times a week.   hydrochlorothiazide  (HYDRODIURIL ) 25 MG tablet Take 1 tablet (25 mg total) by mouth daily.   HYDROcodone -acetaminophen  (NORCO) 10-325 MG tablet Take 1 tablet by mouth every 4 (four) hours as needed for severe pain.   Lancets MISC Please dispense based on patient and insurance preference. Use as directed to monitor FSBS 1x daily. Dx: R73.09.   losartan  (COZAAR ) 50 MG tablet Take 1 tablet (50 mg total) by mouth daily.   nystatin -triamcinolone  ointment (MYCOLOG) Use bid for 2 weeks then stop can repeat if necessary   ondansetron  (ZOFRAN ) 4 MG tablet TAKE 1 TABLET BY MOUTH EVERY 8 HOURS AS NEEDED FOR NAUSEA AND VOMITING   ORACEA  40 MG CPDR Take 40 mg by mouth daily.   rosuvastatin  (CRESTOR ) 20 MG tablet Take 1 tablet (20 mg total) by mouth daily.   tirzepatide  (MOUNJARO ) 5 MG/0.5ML Pen Inject 5 mg into the skin once a week.   venlafaxine  XR (EFFEXOR -XR) 150 MG 24 hr capsule Take 2 capsules (300 mg total) by mouth daily with breakfast.   Vitamin D , Ergocalciferol , (DRISDOL ) 1.25 MG (50000 UNIT) CAPS capsule Take 1 capsule (50,000 Units total) by mouth every 7 (seven) days.   zonisamide  (ZONEGRAN ) 100 MG capsule Take 200mg  daily at bedtime     Allergies:   Penicillins, Toradol  [ketorolac  tromethamine ], Hydromorphone , and Lamotrigine   Social History   Tobacco Use   Smoking status: Former    Current packs/day: 0.00    Average  packs/day: 0.5 packs/day for 10.0 years (5.0 ttl pk-yrs)    Types: Cigarettes    Start date: 05/14/1981    Quit date: 05/15/1991    Years since quitting: 32.5   Smokeless tobacco: Never  Vaping Use   Vaping status: Never Used  Substance Use Topics   Alcohol use: Yes    Comment: socially   Drug use: No     Family Hx: The patient's family history includes Breast cancer in her cousin, cousin, cousin, and  paternal aunt; Cancer in her cousin and maternal grandfather; Diabetes in her father, paternal grandfather, and paternal grandmother; Heart disease (age of onset: 60) in her father; Hyperlipidemia in her father; Hypertension in her father; Obesity in her father; Sleep apnea in her father. There is no history of Liver cancer.  ROS:   Please see the history of present illness.     All other systems reviewed and are negative.   Prior Sleep studies:   The following studies were reviewed today:  Home sleep study, PAP compliance download, CPAP titration  Labs/Other Tests and Data Reviewed:     Recent Labs: 05/14/2023: TSH 1.890 10/09/2023: ALT 18; BUN 14; Creatinine, Ser 0.71; Hemoglobin 13.4; Platelets 328; Potassium 4.9; Sodium 139    Wt Readings from Last 3 Encounters:  12/04/23 212 lb (96.2 kg)  12/04/23 212 lb 8 oz (96.4 kg)  12/03/23 211 lb (95.7 kg)        Objective:    Vital Signs:  BP 128/80   Pulse (!) 53   Ht 5' 6.5 (1.689 m)   Wt 212 lb (96.2 kg)   SpO2 99%   BMI 33.71 kg/m   GEN: Well nourished, well developed in no acute distress HEENT: Normal NECK: No JVD; No carotid bruits LYMPHATICS: No lymphadenopathy CARDIAC:RRR, no murmurs, rubs, gallops RESPIRATORY:  Clear to auscultation without rales, wheezing or rhonchi  ABDOMEN: Soft, non-tender, non-distended MUSCULOSKELETAL:  No edema; No deformity  SKIN: Warm and dry NEUROLOGIC:  Alert and oriented x 3 PSYCHIATRIC:  Normal affect  ASSESSMENT & PLAN:    OSA - The patient is tolerating PAP therapy well  without any problems. The PAP download performed by his DME was personally reviewed and interpreted by me today and showed an AHI of 1.3 /hr on 15 cm H2O with 57% compliance in using more than 4 hours nightly.  The patient has been using and benefiting from PAP use and will continue to benefit from therapy.  -Her compliance was only 22/30 days due to going on a cruise -her average usage is 5.5 hours nightly -Encouraged her to be more compliant with her device -encouraged her to change out her cushion monthly to avoid leakage  Hypertension - BP controlled on exam - Continue amlodipine  10 mg daily, HCTZ 25 mg daily, losartan  50 mg daily    Medication Adjustments/Labs and Tests Ordered: Current medicines are reviewed at length with the patient today.  Concerns regarding medicines are outlined above.   Tests Ordered: No orders of the defined types were placed in this encounter.   Medication Changes: No orders of the defined types were placed in this encounter.   Follow Up:  In Person in 1 year(s)  Signed, Wilbert Bihari, MD  12/04/2023 2:05 PM    [facility]

## 2023-12-05 ENCOUNTER — Encounter (INDEPENDENT_AMBULATORY_CARE_PROVIDER_SITE_OTHER): Payer: Self-pay | Admitting: Family Medicine

## 2023-12-05 ENCOUNTER — Ambulatory Visit (INDEPENDENT_AMBULATORY_CARE_PROVIDER_SITE_OTHER): Admitting: Family Medicine

## 2023-12-05 VITALS — BP 101/64 | HR 55 | Temp 98.1°F | Ht 66.0 in | Wt 210.0 lb

## 2023-12-05 DIAGNOSIS — K760 Fatty (change of) liver, not elsewhere classified: Secondary | ICD-10-CM | POA: Diagnosis not present

## 2023-12-05 DIAGNOSIS — E559 Vitamin D deficiency, unspecified: Secondary | ICD-10-CM | POA: Diagnosis not present

## 2023-12-05 DIAGNOSIS — Z7985 Long-term (current) use of injectable non-insulin antidiabetic drugs: Secondary | ICD-10-CM

## 2023-12-05 DIAGNOSIS — E669 Obesity, unspecified: Secondary | ICD-10-CM

## 2023-12-05 DIAGNOSIS — G4733 Obstructive sleep apnea (adult) (pediatric): Secondary | ICD-10-CM | POA: Diagnosis not present

## 2023-12-05 DIAGNOSIS — E1165 Type 2 diabetes mellitus with hyperglycemia: Secondary | ICD-10-CM | POA: Diagnosis not present

## 2023-12-05 DIAGNOSIS — Z6833 Body mass index (BMI) 33.0-33.9, adult: Secondary | ICD-10-CM

## 2023-12-05 MED ORDER — TIRZEPATIDE 2.5 MG/0.5ML ~~LOC~~ SOAJ: 2.5000 mg | SUBCUTANEOUS | 0 refills | Status: DC

## 2023-12-05 MED ORDER — VITAMIN D (ERGOCALCIFEROL) 1.25 MG (50000 UNIT) PO CAPS
50000.0000 [IU] | ORAL_CAPSULE | ORAL | 0 refills | Status: DC
Start: 2023-12-05 — End: 2024-01-10

## 2023-12-07 NOTE — Progress Notes (Signed)
 [name], D.O.  ABFM, ABOM Specializing in Clinical Bariatric Medicine  Office located at: [address] [place], [place]  [address]      A) FOR THE CHRONIC DISEASE OF OBESITY:  Chief complaint: Obesity [name] is here to discuss her progress with her obesity treatment plan.   History of present illness / Interval history:  [name] is here today for her follow-up office visit.  Since last OV on 10/08/2023, pt is up 4 lbs. Is frustrated with herself because she was on vacation where she ate off-plan foods and didn't worry about MP. Stressed with familial obligations. (Mother in law in hospital and her kids.)     10/08/23 13:00 12/05/23 11:00   Body Fat % 44.5 % 45.4 %  Muscle Mass (lbs) 109 lbs 109 lbs  Fat Mass (lbs) 92 lbs 95.6 lbs  Total Body Water (lbs) 76.8 lbs 77.8 lbs  Visceral Fat Rating  12 12   Counseling done on how various foods will affect these numbers and how to maximize success   Total lbs lost to date: -7 lbs Total Fat Mass in lbs lost to date:  Total weight loss percentage to date: -3.23 %   Nutrition Therapy She is on 1300-1400 cal and 85+ grams protein with the Cat 2 meal plan as a guide and states she is following her eating plan approximately 0 % of the time.   - Tracking Calories/Macros: no -    - Eating More Whole Foods: yes  - Adequate Protein Intake: yes  - Adequate Water Intake: no -    - Skipping Meals: yes; If she eats breakfast, she skips lunch and then will have a big dinner.   - Sleeping 7-9 Hours/ Night: no -     [name] is currently in the action stage of change. As such, her goal is to continue weight management plan.  She has agreed to: continue current plan   Physical Activity Pt is not exercising.   [name] has been advised to work up to 300-450 minutes of moderate intensity aerobic activity a week and strengthening exercises 2-3 times per week for cardiovascular health, weight loss maintenance and preservation  of muscle mass.  She has agreed to : Think about enjoyable ways to increase daily physical activity and overcoming barriers to exercise, Increase physical activity in their day and reduce sedentary time (increase NEAT)., Increase volume of physical activity to a goal of 240 minutes a week, and Combine aerobic and strengthening exercises for efficiency and improved cardiometabolic health.   Behavioral Modifications Evidence-based interventions for health behavior change were utilized today including the discussion of  1) self monitoring techniques:  none 2) problem-solving barriers:  none 3) self care:  none 4) SMART goals for next OV:  Follow the meal plan and eat all the food on plan.  Regarding patient's less desirable eating habits and patterns, we employed the technique of small changes.   We discussed the following today: increasing lean protein intake to established goals, high protein, low carb food options, smaller meals throughout the day.  Additional resources provided today: None   Medical Interventions/ Pharmacotherapy Previous Bariatric surgery: no Pharmacotherapy for weight loss: She is currently taking Mounjaro  5 mg weekly for medical weight loss.    We discussed various medication options to help [name] with her weight loss efforts and we both agreed to : a dose change of Mounjaro  from 5 mg to 2.5 mg once weekly.   B)  OBESITY RELATED CONDITIONS ADDRESSED TODAY: Obesity, Starting BMI 35.04 BMI 33.0-33.9,adult - current BMI 33.91  Type 2 diabetes mellitus with hyperglycemia, without long-term current use of insulin  [facility]) Assessment & Plan Lab Results  Component Value Date   HGBA1C 5.5 10/08/2023   HGBA1C 6.7 (H) 05/14/2023   HGBA1C 6.6 (H) 01/16/2023   INSULIN  17.8 05/14/2023  Used to be on [place], but that was discontinued. Currently on Mounjaro  5 mg once weekly. States when she is compliant with medication she notices her hunger and cravings are well controlled.  However, reports poor adherence to medication, because she feels severe nausea and sluggishness. Sx with Mounjaro  5 mg: nausea all day long which is causing her to not eat. Denies any abdominal pain, vomiting, worsened constipation, and cramping. Saw Neurology yesterday and discussed Mounjaro  with them. Pt states that when she was on Mounjaro  2.5 mg dose, she experienced fewer side effects. Switched dose from Mounjaro  5 mg weekly to Mounjaro  2.5 mg weekly.    A1c is at goal . No acute concerns today. Educated pt that A1c will decrease with weight loss and exercise. Patient asked about a mediterranean diet, and we educated that our MP follows the same foods as a mediterranean diet with fruits, veggies, and lean protein. Also discussed ways to increase energy such as following healthy habits such as a low carb , high protein diet, exercising and sleeping more. Pt was encouraged to cont decreasing simple carbohydrates, increasing lean proteins, and exercising an hour a day alongside lifting weight 3 days a week. Encouraged to follow up with Dr.prickard, PCP about T2DM.    Metabolic dysfunction-associated steatotic liver disease (MASLD) Assessment & Plan    Component Value Date/Time   PROT 7.1 10/09/2023 1338   ALBUMIN 4.4 10/09/2023 1338   AST 14 10/09/2023 1338   ALT 18 10/09/2023 1338   ALKPHOS 168 (H) 10/09/2023 1338   BILITOT 0.4 10/09/2023 1338   BILIDIR 0.16 06/25/2023 0821   IBILI 0.3 03/02/2023 0943  Encouraged pt to continue with prudent nutritional plan and increase exercise to improve liver function. Will continue to monitor.  Vitamin D  deficiency Assessment & Plan Lab Results  Component Value Date   VD25OH 40.3 10/08/2023   VD25OH 13.0 (L) 05/14/2023  Currently on Ergo 50,000 units once a week with good compliance and tolerance. Vit D levels have improved from 13.0 on 05/14/2023 to 40.3 10/08/2023, but is still not a goal; optimal between 50-90. Attributes the Vit d level because  she skipped a dose of Vit D. No acute concerns today. Pt was encouraged to not skip, but cont supplementation (refill today). Will recheck levels in 1-2 months to continue monitoring.      OSA (obstructive sleep apnea)- on cpap Assessment & Plan When on vacation pt did not take CPAP with her, but is compliant with it regardless. Advised to to sleep 7-9 hours every night to help aid weight loss and aid with stress.        Medications Discontinued During This Encounter  Medication Reason   tirzepatide  (MOUNJARO ) 5 MG/0.5ML Pen Dose change   Vitamin D , Ergocalciferol , (DRISDOL ) 1.25 MG (50000 UNIT) CAPS capsule Reorder     Meds ordered this encounter  Medications   Vitamin D , Ergocalciferol , (DRISDOL ) 1.25 MG (50000 UNIT) CAPS capsule    Sig: Take 1 capsule (50,000 Units total) by mouth every 7 (seven) days.    Dispense:  4 capsule    Refill:  0   tirzepatide  (MOUNJARO ) 2.5 MG/0.5ML Pen  Sig: Inject 2.5 mg into the skin once a week.    Dispense:  2 mL    Refill:  0      Follow up:   Return 01/01/2024 11:40 AM.  She was informed of the importance of frequent follow up visits to maximize her success with intensive lifestyle modifications for her multiple health conditions.   Weight Summary and Biometrics    Objective:   PHYSICAL EXAM: Blood pressure 101/64, pulse (!) 55, temperature 98.1 F (36.7 C), height 5' 6 (1.676 m), weight 210 lb (95.3 kg), SpO2 98%. Body mass index is 33.89 kg/m.  General: she is overweight, cooperative and in no acute distress. PSYCH: Has normal mood, affect and thought process.   HEENT: EOMI, sclerae are anicteric. Lungs: Normal breathing effort, no conversational dyspnea. Extremities: Moves * 4 Neurologic: A and O * 3, good insight  DIAGNOSTIC DATA REVIEWED: BMET    Component Value Date/Time   NA 139 10/09/2023 1338   K 4.9 10/09/2023 1338   CL 104 10/09/2023 1338   CO2 22 10/09/2023 1338   GLUCOSE 97 10/09/2023 1338   GLUCOSE  137 (H) 01/16/2023 0845   BUN 14 10/09/2023 1338   CREATININE 0.71 10/09/2023 1338   CREATININE 0.61 01/16/2023 0845   CALCIUM  9.4 10/09/2023 1338   GFRNONAA >60 07/27/2022 1400   GFRNONAA 106 02/16/2020 1144   GFRAA 123 02/16/2020 1144   Lab Results  Component Value Date   HGBA1C 5.5 10/08/2023   HGBA1C 5.2 05/19/2013   Lab Results  Component Value Date   INSULIN  17.8 05/14/2023   Lab Results  Component Value Date   TSH 1.890 05/14/2023   CBC    Component Value Date/Time   WBC 9.9 10/09/2023 1338   WBC 6.7 03/02/2023 0943   RBC 4.73 10/09/2023 1338   RBC 4.77 03/02/2023 0943   HGB 13.4 10/09/2023 1338   HCT 42.4 10/09/2023 1338   PLT 328 10/09/2023 1338   MCV 90 10/09/2023 1338   MCH 28.3 10/09/2023 1338   MCH 29.4 03/02/2023 0943   MCHC 31.6 10/09/2023 1338   MCHC 33.3 03/02/2023 0943   RDW 13.1 10/09/2023 1338   Iron Studies No results found for: IRON, TIBC, FERRITIN, IRONPCTSAT Lipid Panel     Component Value Date/Time   CHOL 212 (H) 05/14/2023 1057   TRIG 134 05/14/2023 1057   HDL 59 05/14/2023 1057   CHOLHDL 3.6 05/14/2023 1057   CHOLHDL 3.5 01/16/2023 0845   VLDL 26 05/28/2019 0955   LDLCALC 129 (H) 05/14/2023 1057   LDLCALC 125 (H) 01/16/2023 0845   Hepatic Function Panel     Component Value Date/Time   PROT 7.1 10/09/2023 1338   ALBUMIN 4.4 10/09/2023 1338   AST 14 10/09/2023 1338   ALT 18 10/09/2023 1338   ALKPHOS 168 (H) 10/09/2023 1338   BILITOT 0.4 10/09/2023 1338   BILIDIR 0.16 06/25/2023 0821   IBILI 0.3 03/02/2023 0943      Component Value Date/Time   TSH 1.890 05/14/2023 1057   Nutritional Lab Results  Component Value Date   VD25OH 40.3 10/08/2023   VD25OH 13.0 (L) 05/14/2023    Attestations:   LILLETTE Feliciano Mingle, acting as a Stage manager for [name] Jenkins, DO., have compiled all relevant documentation for today's office visit on behalf of [name] Jenkins, DO, while in the presence of [employer], DO.    I  have reviewed the above documentation for accuracy and completeness, and I agree with the above. [name]  JINNY [name], D.O.  The 21st Century Cures Act was signed into law in 2016 which includes the topic of electronic health records.  This provides immediate access to information in MyChart.  This includes consultation notes, operative notes, office notes, lab results and pathology reports.  If you have any questions about what you read please let us  know at your next visit so we can discuss your concerns and take corrective action if need be.  We are right here with you.

## 2023-12-27 ENCOUNTER — Encounter: Payer: Self-pay | Admitting: Cardiology

## 2023-12-27 NOTE — Telephone Encounter (Signed)
 Forward to sleep team

## 2024-01-01 ENCOUNTER — Inpatient Hospital Stay (HOSPITAL_COMMUNITY)

## 2024-01-01 ENCOUNTER — Ambulatory Visit (INDEPENDENT_AMBULATORY_CARE_PROVIDER_SITE_OTHER): Admitting: Family Medicine

## 2024-01-01 ENCOUNTER — Other Ambulatory Visit: Payer: Self-pay

## 2024-01-01 ENCOUNTER — Emergency Department (HOSPITAL_COMMUNITY)

## 2024-01-01 ENCOUNTER — Inpatient Hospital Stay (HOSPITAL_COMMUNITY)
Admission: EM | Admit: 2024-01-01 | Discharge: 2024-01-04 | DRG: 336 | Disposition: A | Discharge status: Home / Self Care | Attending: Internal Medicine | Admitting: Internal Medicine

## 2024-01-01 ENCOUNTER — Encounter (HOSPITAL_COMMUNITY): Payer: Self-pay | Admitting: Emergency Medicine

## 2024-01-01 DIAGNOSIS — K529 Noninfective gastroenteritis and colitis, unspecified: Secondary | ICD-10-CM | POA: Diagnosis present

## 2024-01-01 DIAGNOSIS — E860 Dehydration: Secondary | ICD-10-CM | POA: Diagnosis present

## 2024-01-01 DIAGNOSIS — Z833 Family history of diabetes mellitus: Secondary | ICD-10-CM | POA: Diagnosis not present

## 2024-01-01 DIAGNOSIS — F319 Bipolar disorder, unspecified: Secondary | ICD-10-CM | POA: Diagnosis present

## 2024-01-01 DIAGNOSIS — E876 Hypokalemia: Secondary | ICD-10-CM | POA: Diagnosis not present

## 2024-01-01 DIAGNOSIS — M5416 Radiculopathy, lumbar region: Secondary | ICD-10-CM | POA: Diagnosis present

## 2024-01-01 DIAGNOSIS — F419 Anxiety disorder, unspecified: Secondary | ICD-10-CM | POA: Diagnosis present

## 2024-01-01 DIAGNOSIS — Z7985 Long-term (current) use of injectable non-insulin antidiabetic drugs: Secondary | ICD-10-CM

## 2024-01-01 DIAGNOSIS — Z87891 Personal history of nicotine dependence: Secondary | ICD-10-CM | POA: Diagnosis not present

## 2024-01-01 DIAGNOSIS — Z885 Allergy status to narcotic agent status: Secondary | ICD-10-CM

## 2024-01-01 DIAGNOSIS — E119 Type 2 diabetes mellitus without complications: Secondary | ICD-10-CM | POA: Diagnosis present

## 2024-01-01 DIAGNOSIS — Z79899 Other long term (current) drug therapy: Secondary | ICD-10-CM | POA: Diagnosis not present

## 2024-01-01 DIAGNOSIS — I10 Essential (primary) hypertension: Secondary | ICD-10-CM | POA: Diagnosis present

## 2024-01-01 DIAGNOSIS — E872 Acidosis, unspecified: Secondary | ICD-10-CM | POA: Diagnosis present

## 2024-01-01 DIAGNOSIS — K219 Gastro-esophageal reflux disease without esophagitis: Secondary | ICD-10-CM | POA: Diagnosis present

## 2024-01-01 DIAGNOSIS — K760 Fatty (change of) liver, not elsewhere classified: Secondary | ICD-10-CM | POA: Diagnosis present

## 2024-01-01 DIAGNOSIS — Z6834 Body mass index (BMI) 34.0-34.9, adult: Secondary | ICD-10-CM

## 2024-01-01 DIAGNOSIS — Z9049 Acquired absence of other specified parts of digestive tract: Secondary | ICD-10-CM

## 2024-01-01 DIAGNOSIS — Z90710 Acquired absence of both cervix and uterus: Secondary | ICD-10-CM

## 2024-01-01 DIAGNOSIS — Z888 Allergy status to other drugs, medicaments and biological substances status: Secondary | ICD-10-CM

## 2024-01-01 DIAGNOSIS — Z880 Allergy status to penicillin: Secondary | ICD-10-CM

## 2024-01-01 DIAGNOSIS — M069 Rheumatoid arthritis, unspecified: Secondary | ICD-10-CM | POA: Diagnosis present

## 2024-01-01 DIAGNOSIS — E7800 Pure hypercholesterolemia, unspecified: Secondary | ICD-10-CM | POA: Diagnosis present

## 2024-01-01 DIAGNOSIS — Z8719 Personal history of other diseases of the digestive system: Secondary | ICD-10-CM

## 2024-01-01 DIAGNOSIS — Z8249 Family history of ischemic heart disease and other diseases of the circulatory system: Secondary | ICD-10-CM | POA: Diagnosis not present

## 2024-01-01 DIAGNOSIS — K565 Intestinal adhesions [bands], unspecified as to partial versus complete obstruction: Principal | ICD-10-CM | POA: Diagnosis present

## 2024-01-01 DIAGNOSIS — Z860100 Personal history of colon polyps, unspecified: Secondary | ICD-10-CM

## 2024-01-01 DIAGNOSIS — Z9851 Tubal ligation status: Secondary | ICD-10-CM

## 2024-01-01 DIAGNOSIS — G4733 Obstructive sleep apnea (adult) (pediatric): Secondary | ICD-10-CM | POA: Diagnosis present

## 2024-01-01 DIAGNOSIS — K56609 Unspecified intestinal obstruction, unspecified as to partial versus complete obstruction: Principal | ICD-10-CM | POA: Diagnosis present

## 2024-01-01 DIAGNOSIS — Z981 Arthrodesis status: Secondary | ICD-10-CM

## 2024-01-01 DIAGNOSIS — E669 Obesity, unspecified: Secondary | ICD-10-CM | POA: Diagnosis present

## 2024-01-01 LAB — COMPREHENSIVE METABOLIC PANEL WITH GFR
ALT: 22 U/L (ref 0–44)
AST: 26 U/L (ref 15–41)
Albumin: 3.9 g/dL (ref 3.5–5.0)
Alkaline Phosphatase: 131 U/L — ABNORMAL HIGH (ref 38–126)
Anion gap: 13 (ref 5–15)
BUN: 14 mg/dL (ref 6–20)
CO2: 16 mmol/L — ABNORMAL LOW (ref 22–32)
Calcium: 9 mg/dL (ref 8.9–10.3)
Chloride: 106 mmol/L (ref 98–111)
Creatinine, Ser: 0.84 mg/dL (ref 0.44–1.00)
GFR, Estimated: >60 mL/min (ref >60)
Glucose, Bld: 165 mg/dL — ABNORMAL HIGH (ref 70–99)
Potassium: 4.3 mmol/L (ref 3.5–5.1)
Sodium: 135 mmol/L (ref 135–145)
Total Bilirubin: 0.9 mg/dL (ref 0.0–1.2)
Total Protein: 7.6 g/dL (ref 6.5–8.1)

## 2024-01-01 LAB — CBC WITH DIFFERENTIAL/PLATELET
Abs Immature Granulocytes: 0.08 K/uL — ABNORMAL HIGH (ref 0.00–0.07)
Basophils Absolute: 0 K/uL (ref 0.0–0.1)
Basophils Relative: 0 %
Eosinophils Absolute: 0.3 K/uL (ref 0.0–0.5)
Eosinophils Relative: 2 %
HCT: 52.2 % — ABNORMAL HIGH (ref 36.0–46.0)
Hemoglobin: 17.2 g/dL — ABNORMAL HIGH (ref 12.0–15.0)
Immature Granulocytes: 1 %
Lymphocytes Relative: 14 %
Lymphs Abs: 2.5 K/uL (ref 0.7–4.0)
MCH: 28.3 pg (ref 26.0–34.0)
MCHC: 33 g/dL (ref 30.0–36.0)
MCV: 85.9 fL (ref 80.0–100.0)
Monocytes Absolute: 1 K/uL (ref 0.1–1.0)
Monocytes Relative: 6 %
Neutro Abs: 13.5 K/uL — ABNORMAL HIGH (ref 1.7–7.7)
Neutrophils Relative %: 77 %
Platelets: 489 K/uL — ABNORMAL HIGH (ref 150–400)
RBC: 6.08 MIL/uL — ABNORMAL HIGH (ref 3.87–5.11)
RDW: 13.7 % (ref 11.5–15.5)
WBC: 17.4 K/uL — ABNORMAL HIGH (ref 4.0–10.5)
nRBC: 0 % (ref 0.0–0.2)

## 2024-01-01 LAB — URINALYSIS, ROUTINE W REFLEX MICROSCOPIC
Bilirubin Urine: NEGATIVE
Glucose, UA: NEGATIVE mg/dL
Hgb urine dipstick: NEGATIVE
Ketones, ur: NEGATIVE mg/dL
Leukocytes,Ua: NEGATIVE
Nitrite: NEGATIVE
Protein, ur: NEGATIVE mg/dL
Specific Gravity, Urine: >1.046 — ABNORMAL HIGH (ref 1.005–1.030)
pH: 5 (ref 5.0–8.0)

## 2024-01-01 LAB — GLUCOSE, CAPILLARY
Glucose-Capillary: 100 mg/dL — ABNORMAL HIGH (ref 70–99)
Glucose-Capillary: 108 mg/dL — ABNORMAL HIGH (ref 70–99)

## 2024-01-01 LAB — CREATININE, SERUM
Creatinine, Ser: 0.72 mg/dL (ref 0.44–1.00)
GFR, Estimated: >60 mL/min (ref >60)

## 2024-01-01 LAB — CBC
HCT: 48.5 % — ABNORMAL HIGH (ref 36.0–46.0)
Hemoglobin: 16 g/dL — ABNORMAL HIGH (ref 12.0–15.0)
MCH: 28.7 pg (ref 26.0–34.0)
MCHC: 33 g/dL (ref 30.0–36.0)
MCV: 86.9 fL (ref 80.0–100.0)
Platelets: 415 K/uL — ABNORMAL HIGH (ref 150–400)
RBC: 5.58 MIL/uL — ABNORMAL HIGH (ref 3.87–5.11)
RDW: 13.7 % (ref 11.5–15.5)
WBC: 13.9 K/uL — ABNORMAL HIGH (ref 4.0–10.5)
nRBC: 0 % (ref 0.0–0.2)

## 2024-01-01 LAB — I-STAT CG4 LACTIC ACID, ED: Lactic Acid, Venous: 1.2 mmol/L (ref 0.5–1.9)

## 2024-01-01 LAB — HIV ANTIBODY (ROUTINE TESTING W REFLEX): HIV Screen 4th Generation wRfx: NONREACTIVE

## 2024-01-01 LAB — CBG MONITORING, ED: Glucose-Capillary: 128 mg/dL — ABNORMAL HIGH (ref 70–99)

## 2024-01-01 MED ORDER — ONDANSETRON HCL 4 MG/2ML IJ SOLN
4.0000 mg | INTRAMUSCULAR | Status: DC | PRN
  Administered 2024-01-01 – 2024-01-04 (×9): 4 mg via INTRAVENOUS
  Filled 2024-01-01 (×10): qty 2

## 2024-01-01 MED ORDER — LORAZEPAM 2 MG/ML IJ SOLN
1.0000 mg | Freq: Once | INTRAMUSCULAR | Status: AC
  Administered 2024-01-01: 1 mg via INTRAVENOUS
  Filled 2024-01-01: qty 1

## 2024-01-01 MED ORDER — DIATRIZOATE MEGLUMINE & SODIUM 66-10 % PO SOLN
90.0000 mL | Freq: Once | ORAL | Status: AC
  Administered 2024-01-01: 90 mL via NASOGASTRIC
  Filled 2024-01-01 (×2): qty 90

## 2024-01-01 MED ORDER — ACETAMINOPHEN 10 MG/ML IV SOLN
1000.0000 mg | Freq: Four times a day (QID) | INTRAVENOUS | Status: AC
  Administered 2024-01-01 – 2024-01-02 (×3): 1000 mg via INTRAVENOUS
  Filled 2024-01-01 (×3): qty 100

## 2024-01-01 MED ORDER — ONDANSETRON HCL 4 MG/2ML IJ SOLN
4.0000 mg | Freq: Once | INTRAMUSCULAR | Status: AC
  Administered 2024-01-01: 4 mg via INTRAVENOUS
  Filled 2024-01-01: qty 2

## 2024-01-01 MED ORDER — SODIUM CHLORIDE 0.9 % IV SOLN: INTRAVENOUS | Status: DC

## 2024-01-01 MED ORDER — MORPHINE SULFATE (PF) 2 MG/ML IV SOLN
1.0000 mg | INTRAVENOUS | Status: DC | PRN
  Administered 2024-01-01 – 2024-01-03 (×7): 1 mg via INTRAVENOUS
  Filled 2024-01-01 (×7): qty 1

## 2024-01-01 MED ORDER — SODIUM CHLORIDE 0.9 % IV BOLUS
1000.0000 mL | Freq: Once | INTRAVENOUS | Status: AC
  Administered 2024-01-01: 1000 mL via INTRAVENOUS

## 2024-01-01 MED ORDER — IOHEXOL 350 MG/ML SOLN
75.0000 mL | Freq: Once | INTRAVENOUS | Status: AC | PRN
  Administered 2024-01-01: 75 mL via INTRAVENOUS

## 2024-01-01 MED ORDER — INSULIN ASPART 100 UNIT/ML IJ SOLN
0.0000 [IU] | INTRAMUSCULAR | Status: DC
  Administered 2024-01-01: 2 [IU] via SUBCUTANEOUS

## 2024-01-01 MED ORDER — ENOXAPARIN SODIUM 40 MG/0.4ML IJ SOSY
40.0000 mg | PREFILLED_SYRINGE | INTRAMUSCULAR | Status: DC
  Administered 2024-01-01 – 2024-01-04 (×3): 40 mg via SUBCUTANEOUS
  Filled 2024-01-01 (×3): qty 0.4

## 2024-01-01 MED ORDER — MORPHINE SULFATE (PF) 4 MG/ML IV SOLN
4.0000 mg | Freq: Once | INTRAVENOUS | Status: AC
  Administered 2024-01-01: 4 mg via INTRAVENOUS
  Filled 2024-01-01: qty 1

## 2024-01-01 NOTE — Plan of Care (Signed)

## 2024-01-01 NOTE — ED Provider Notes (Signed)
 [name] Provider Note   CSN: [id number] Arrival date & time: 01/01/24  9371     Patient presents with: Back Pain   [name] is a 58 y.o. female.   Patient with h/o DJD, several lower back surgeries/fusions, h/o appendectomy and cholecystectomy -- presents with c/o left lower back pain with radiation down her left leg.  This started a couple of weeks ago.  She states that it worsened, more noticeable at night, she is able to get up during the day.  She states that she has had some fecal incontinence and has had more urinary incontinence than she would expect.  She saw her PCP, has been placed on muscle relaxers and prednisone .  She could not tolerate the prednisone  and discontinued.  Patient has developed generalized abdominal pain and tightness as well with associated diarrhea.  No vomiting, but had nausea this AM.  Diarrhea has been present for 3 days.  It is watery, no solid component.  She woke this morning, got up to use the bathroom.  She had an episode where she was very lightheaded and dizzy, diaphoresis, states nearly passed out.  This caused her to come to the emergency department.       Prior to Admission medications   Medication Sig Start Date End Date Taking? Authorizing Provider  ACCU-CHEK GUIDE test strip USE AS DIRECTED TO MONITOR FSBS 1X DAILY. DX: R73.09. 02/01/21   Duanne Butler DASEN, MD  amLODipine  (NORVASC ) 10 MG tablet Take 1 tablet (10 mg total) by mouth daily. 05/07/23   Okey Vina GAILS, MD  b complex vitamins capsule With 500mcg b12 daily 05/28/23   Midge Sober, DO  Blood Glucose Monitoring Suppl (BLOOD GLUCOSE SYSTEM PAK) KIT Please dispense based on patient and insurance preference. Use as directed to monitor FSBS 1x daily. Dx: R73.09. 12/01/19   Duanne Butler DASEN, MD  estradiol  (VIVELLE -DOT) 0.1 MG/24HR patch Place 1 patch (0.1 mg total) onto the skin 2 (two) times a week. 06/07/23   Signa Delon LABOR, NP   hydrochlorothiazide  (HYDRODIURIL ) 25 MG tablet Take 1 tablet (25 mg total) by mouth daily. 02/17/22   Duanne Butler DASEN, MD  HYDROcodone -acetaminophen  (NORCO) 10-325 MG tablet Take 1 tablet by mouth every 4 (four) hours as needed for severe pain. 08/04/22   Colon Shove, MD  Lancets MISC Please dispense based on patient and insurance preference. Use as directed to monitor FSBS 1x daily. Dx: R73.09. 12/01/19   Duanne Butler DASEN, MD  losartan  (COZAAR ) 50 MG tablet Take 1 tablet (50 mg total) by mouth daily. 04/10/23   Okey Vina GAILS, MD  nystatin -triamcinolone  ointment [facility]) Use bid for 2 weeks then stop can repeat if necessary 06/06/23   Signa Delon LABOR, NP  ondansetron  (ZOFRAN ) 4 MG tablet TAKE 1 TABLET BY MOUTH EVERY 8 HOURS AS NEEDED FOR NAUSEA AND VOMITING 04/28/22   Duanne Butler DASEN, MD  ORACEA  40 MG CPDR Take 40 mg by mouth daily. 08/09/23   Duanne Butler DASEN, MD  rosuvastatin  (CRESTOR ) 20 MG tablet Take 1 tablet (20 mg total) by mouth daily. 05/29/23   Okey Vina GAILS, MD  tirzepatide  (MOUNJARO ) 2.5 MG/0.5ML Pen Inject 2.5 mg into the skin once a week. 12/05/23   Midge Sober, DO  venlafaxine  XR (EFFEXOR -XR) 150 MG 24 hr capsule Take 2 capsules (300 mg total) by mouth daily with breakfast. 09/03/23   Duanne Butler DASEN, MD  Vitamin D , Ergocalciferol , (DRISDOL ) 1.25 MG (50000 UNIT) CAPS  capsule Take 1 capsule (50,000 Units total) by mouth every 7 (seven) days. 12/05/23   Midge Sober, DO  zonisamide  (ZONEGRAN ) 100 MG capsule Take 200mg  daily at bedtime 12/04/23   [name], [name], NP    Allergies: Penicillins, Toradol  [ketorolac  tromethamine ], Hydromorphone , and Lamotrigine    Review of Systems  Updated Vital Signs BP 105/81 (BP Location: Right Arm)   Pulse (!) 114   Temp 97.9 F (36.6 C) (Oral)   Resp 18   Wt 96 kg   SpO2 100%   BMI 34.16 kg/m   Physical Exam Vitals and nursing note reviewed.  Constitutional:      General: She is not in acute distress.    Appearance: She is  well-developed.  HENT:     Head: Normocephalic and atraumatic.     Right Ear: External ear normal.     Left Ear: External ear normal.     Nose: Nose normal.  Eyes:     Conjunctiva/sclera: Conjunctivae normal.  Cardiovascular:     Rate and Rhythm: Normal rate and regular rhythm.     Heart sounds: No murmur heard. Pulmonary:     Effort: Pulmonary effort is normal. No respiratory distress.     Breath sounds: No wheezing, rhonchi or rales.  Abdominal:     Palpations: Abdomen is soft.     Tenderness: There is abdominal tenderness. There is no guarding or rebound.     Comments: Patient with generalized tenderness across the mid abdomen.  Musculoskeletal:        General: Normal range of motion.     Cervical back: Normal range of motion and neck supple. No spasms or tenderness.     Thoracic back: Tenderness present. No spasms.     Lumbar back: Tenderness present. No spasms.       Back:     Right lower leg: No edema.     Left lower leg: No edema.     Comments: No step-off noted with palpation of spine.   Skin:    General: Skin is warm and dry.     Findings: No rash.  Neurological:     General: No focal deficit present.     Mental Status: She is alert. Mental status is at baseline.     Sensory: No sensory deficit.     Motor: No weakness.     Comments: 5/5 strength in entire lower extremities bilaterally. No sensation deficit.   Psychiatric:        Mood and Affect: Mood normal.     (all labs ordered are listed, but only abnormal results are displayed) Labs Reviewed  CBC WITH DIFFERENTIAL/PLATELET - Abnormal; Notable for the following components:      Result Value   WBC 17.4 (*)    RBC 6.08 (*)    Hemoglobin 17.2 (*)    HCT 52.2 (*)    Platelets 489 (*)    Neutro Abs 13.5 (*)    Abs Immature Granulocytes 0.08 (*)    All other components within normal limits  COMPREHENSIVE METABOLIC PANEL WITH GFR - Abnormal; Notable for the following components:   CO2 16 (*)    Glucose,  Bld 165 (*)    Alkaline Phosphatase 131 (*)    All other components within normal limits  URINALYSIS, ROUTINE W REFLEX MICROSCOPIC    EKG: None  Radiology: CT ABDOMEN PELVIS W CONTRAST Result Date: 01/01/2024 EXAM: CT ABDOMEN AND PELVIS WITH CONTRAST 01/01/2024 09:09:07 AM TECHNIQUE: CT of the abdomen and pelvis was  performed with the administration of 75 mL of iohexol  (OMNIPAQUE ) 350 MG/ML injection. Multiplanar reformatted images are provided for review. Automated exposure control, iterative reconstruction, and/or weight-based adjustment of the mA/kV was utilized to reduce the radiation dose to as low as reasonably achievable. COMPARISON: 08/00/2025 CLINICAL HISTORY: Abdominal pain, acute, nonlocalized; generalized pain, back pain. 75ml Omni; Patient endorses lower back pain radiating into left leg. Patient states she thinks she has a ruptured disc. Patient has history of back problems. Patient states this pain started 2.5 weeks ago. Patient also reports diarrhea since Saturday. FINDINGS: LOWER CHEST: No acute abnormality. LIVER: The liver is unremarkable. GALLBLADDER AND BILE DUCTS: Status post cholecystectomy. No biliary ductal dilatation. SPLEEN: No acute abnormality. PANCREAS: No acute abnormality. ADRENAL GLANDS: No acute abnormality. KIDNEYS, URETERS AND BLADDER: Bilateral nonobstructive nephrolithiasis. No stones in the ureters. No hydronephrosis. No perinephric or periureteral stranding. Urinary bladder is unremarkable. GI AND BOWEL: Status post appendectomy. Moderately dilated small bowel loops are noted centrally concerning for distal small bowel obstruction. No colonic dilatation is noted. There is some twisting of mesentery noted suggesting some degree of malrotation. Stomach demonstrates no acute abnormality. PERITONEUM AND RETROPERITONEUM: Mild amount of free fluid is noted in the pelvis potentially related to small bowel abnormality. No free air. VASCULATURE: Aorta is normal in caliber.  LYMPH NODES: No lymphadenopathy. REPRODUCTIVE ORGANS: Status post hysterectomy. BONES AND SOFT TISSUES: Surgical changes are noted in the lumbar spine. No acute osseous abnormality. No focal soft tissue abnormality. IMPRESSION: 1. Moderately dilated small bowel loops centrally, concerning for distal small bowel obstruction. 2. 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 3. Mild free fluid in the pelvis, potentially related to small bowel abnormality. Electronically signed by: Lynwood Seip MD 01/01/2024 09:29 AM EDT RP Workstation: [id number]   CT L-SPINE NO CHARGE Result Date: 01/01/2024 CLINICAL DATA:  Low back pain radiating to left leg. Symptoms 2 and half weeks. EXAM: CT LUMBAR SPINE WITHOUT CONTRAST TECHNIQUE: Multidetector CT imaging of the lumbar spine was performed without intravenous contrast administration. Multiplanar CT image reconstructions were also generated. RADIATION DOSE REDUCTION: This exam was performed according to the departmental dose-optimization program which includes automated exposure control, adjustment of the mA and/or kV according to patient size and/or use of iterative reconstruction technique. COMPARISON:  07/19/2022 FINDINGS: Segmentation: 5 lumbar type vertebrae. Alignment: Normal. Vertebrae: Vertebral body heights are maintained. There is mild spondylosis of the lumbar spine. Stable posterior fusion hardware at the L4-5 level with bilateral pedicle screws. Stable midline hardware over the posterior elements of the L5-S1 level. Interval placement of left lateral fusion plate at the O7-6 level with 2 associated screws over L2 in 2 suit screws over L3 as well as interbody fusion. Interbody fusion from L3-4 to L5-S1. Small Schmorl's node inferior endplate L1 unchanged. Paraspinal and other soft tissues: Negative. Disc levels: L1-2 level is unremarkable. L2-3 level with left paraspinal fusion hardware as described intact. No obvious disc bulge or significant  neural foraminal narrowing. Borderline canal stenosis. L3-4 level demonstrates minimal right-sided neural foraminal narrowing. No significant canal stenosis. No significant disc bulge. L4-5 level demonstrates no significant canal stenosis or neural foraminal narrowing. L5-S1 level demonstrates no significant canal stenosis or neural foraminal narrowing. There are multiple fluid and air-filled dilated small bowel loops measuring up to 3.7 cm in diameter incompletely evaluated on this exam. IMPRESSION: 1. No acute findings involving the lumbosacral spine. 2. Interval placement of left lateral fusion plate at the O7-6 level with associated screws and interbody  fusion. 3. Stable posterior fusion hardware at the L4-5 level. 4. Stable midline hardware over the posterior elements of the L5-S1 level. 5. Minimal right-sided neural foraminal narrowing at the L3-4 level. Borderline canal stenosis at the L2-3 level. 6. Dilated small bowel loops incompletely evaluated. Referred to patient's CT abdomen/pelvis done today. Electronically Signed   By: Toribio Agreste M.D.   On: 01/01/2024 09:27     Procedures   Medications Ordered in the ED  morphine  (PF) 4 MG/ML injection 4 mg (has no administration in time range)  0.9 %  sodium chloride  infusion (has no administration in time range)  sodium chloride  0.9 % bolus 1,000 mL (has no administration in time range)  morphine  (PF) 4 MG/ML injection 4 mg (4 mg Intravenous Given 01/01/24 0744)  ondansetron  (ZOFRAN ) injection 4 mg (4 mg Intravenous Given 01/01/24 0744)  iohexol  (OMNIPAQUE ) 350 MG/ML injection 75 mL (75 mLs Intravenous Contrast Given 01/01/24 0910)    ED Course  Patient seen and examined. History obtained directly from patient.   Labs/EKG: Ordered CBC, CMP, UA.  Imaging: Ordered CT abd/CT lumbar.   Medications/Fluids: Ordered: Morphine , Zofran .   Most recent vital signs reviewed and are as follows: BP 105/81 (BP Location: Right Arm)   Pulse (!) 114    Temp 97.9 F (36.6 C) (Oral)   Resp 18   Wt 96 kg   SpO2 100%   BMI 34.16 kg/m   Initial impression: Patient has had left lower back pain with radiation down the left leg.  However symptoms today surgery also involve abdomen and she has had significant diarrhea for several days.  This symptoms would be atypical for lumbar radiculopathy.  Will obtain labs and imaging of the abdomen and look at the lumbar spine with this.  Will attempt to control symptoms.  10:24 AM Reassessment performed. Patient appears uncomfortable.  Will order additional pain medication.  Labs personally reviewed and interpreted including: CBC with elevated white blood cell count at 17.4 thousand, hemoglobin elevated at 17.2 likely due to some dehydration; CMP glucose 165, slightly low bicarb at 16 but normal anion gap; UA pending.  Imaging personally visualized and interpreted including: CT concerning for bowel obstruction, radiology report reports possible degree of malrotation.  Reviewed pertinent lab work and imaging with patient at bedside. Questions answered.   Most current vital signs reviewed and are as follows: BP 124/87   Pulse 97   Temp 97.9 F (36.6 C) (Oral)   Resp 18   Wt 96 kg   SpO2 100%   BMI 34.16 kg/m   Plan: Admit.  I discussed case with Ozell RIGGERS of general surgery.  They will consult.  Will give additional pain control and hydration.  Surgery may want NG tube, I discussed this with patient, but will hold as she is not currently vomiting.  Will admit to hospital service.  11:37 AM Discussed case earlier with [facility] .                                   Medical Decision Making Amount and/or Complexity of Data Reviewed Labs: ordered. Radiology: ordered.  Risk Prescription drug management. Decision regarding hospitalization.   For this patient's complaint of abdominal pain, the following conditions were considered on the differential diagnosis: gastritis/PUD, enteritis/duodenitis,  appendicitis, cholelithiasis/cholecystitis, cholangitis, pancreatitis, ruptured viscus, colitis, diverticulitis, small/large bowel obstruction, proctitis, cystitis, pyelonephritis, ureteral colic, aortic dissection, aortic aneurysm. In women, ectopic pregnancy, pelvic  inflammatory disease, ovarian cysts, and tubo-ovarian abscess were also considered. Atypical chest etiologies were also considered including ACS, PE, and pneumonia.  White blood cell count elevated.  CT demonstrates signs of small bowel obstruction.  Patient's symptoms are not completely typical without persistent vomiting, watery stool.  She will be admitted for observation of this.  Patient with also flare of what sounds like left-sided lumbar radiculopathy.  She has not tolerated steroids prescribed as outpatient.  No significant symptoms concerning for cauda equina at this time.  Will continue to monitor symptoms and patient.  Treating pain with morphine .     Final diagnoses:  Small bowel obstruction [facility])  Lumbar radiculopathy    ED Discharge Orders     None          Desiderio Chew, DEVONNA 01/01/24 1137    Dasie Faden, MD 01/02/24 1550

## 2024-01-01 NOTE — ED Notes (Signed)
 NG running at 90mm intermittent suction

## 2024-01-01 NOTE — H&P (Signed)
 History and Physical    Patient: [name] FMW:[id number] DOB: 02-02-1966 DOA: 01/01/2024 DOS: the patient was seen and examined on 01/01/2024 PCP: Duanne Butler DASEN, MD  Patient coming from: Home  Chief Complaint:  Chief Complaint  Patient presents with   Back Pain   HPI: [name] is a 58 y.o. female with medical history significant of bipolar disorder type 1, HTN, HLD, DM2, pseudoseizures, and OSA on CPAP p/w SBO.  The patient reported experiencing sciatica pain associated with diarrhea since Saturday. The diarrhea was persistent, with bowel movements described as very loose, and occurred without accompanying nausea or vomiting. The patient initially felt the symptoms might ease but was awakened by them around 4:45 AM. The patient did not report any abdominal pain throughout this period. The patient described the sciatica pain starting in the left hip, radiating down to the leg and foot, and presenting with numbness, tingling, and burning sensations. The pain also radiated to the other leg and extended upwards. The patient has been unable to sleep on their back due to discomfort and reported having sleep apnea. Recently, the patient noticed abdominal distension but reported no issues with eating or drinking.  In the ED, pt AFVSS. Labs notable for WBC 17. CT abd/pelvis showed moderately dilated small bowel loops centrally, concerning for distal small bowel obstruction. EDP consulted EGS and requested medicine admission.   Review of Systems: As mentioned in the history of present illness. All other systems reviewed and are negative. Past Medical History:  Diagnosis Date   Anxiety    Anxiety    Back pain    Bipolar 1 disorder (HCC)    Bipolar disorder (HCC)    Chest pain    Constipation    Depression    Depression    Diabetes (HCC)    Diabetes mellitus without complication (HCC)    Type 2   Dyspnea    r/t anxiety   Encounter for neuropsychological testing    at [facility]  3/20-suggest possible somatization   Fatty liver    GERD (gastroesophageal reflux disease)    Headache    High blood pressure    High cholesterol    Hypertension    Joint pain    Migraines    OSA on CPAP    moderate obstructive sleep apnea with an AHI of 21.3/h and no central events.  Her supine AHI was 31.3/hr.  O2 saturations were as low as 86%..  On CPAP at 15cm H2O   Pseudoseizure    Last seizure March/April 2024   PTSD (post-traumatic stress disorder)    Rheumatoid arthritis (HCC)    Rosacea    Seizures (HCC)    [facility] (Dr. 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 1990s   ANTERIOR LAT LUMBAR FUSION N/A 08/03/2022   Procedure: Lumbar two-three extreme Lateral Interbody Fusion;  Surgeon: Colon Shove, MD;  Location: [facility] OR;  Service: Neurosurgery;  Laterality: N/A;   APPENDECTOMY     BACK SURGERY     x2   CHOLECYSTECTOMY     COLONOSCOPY WITH PROPOFOL  N/A 07/20/2021   Surgeon: Shaaron Lamar HERO, MD;  Three 6-8 mm polyps in the descending colon and cecum removed, diverticulosis in the entire colon, otherwise  normal exam.  Pathology with tubular adenomas.  Recommended 5-year surveillance.   CYST EXCISION     on ovaries   lumbar     ruptured disc in lumbar taken out   POLYPECTOMY  07/20/2021   Procedure: POLYPECTOMY;  Surgeon: Shaaron Lamar HERO, MD;  Location: [facility];  Service: Endoscopy;;   ROTATOR CUFF REPAIR Left    TONSILLECTOMY     TUBAL LIGATION     Social History:  reports that she quit smoking about 32 years ago. Her smoking use included cigarettes. She started smoking about 42 years ago. She has a 5 pack-year smoking history. She has never used smokeless tobacco. She reports current alcohol use. She reports that  she does not use drugs.  Allergies  Allergen Reactions   Penicillins Hives    Has patient had a PCN reaction causing immediate rash, facial/tongue/throat swelling, SOB or lightheadedness with hypotension: Yes Has patient had a PCN reaction causing severe rash involving mucus membranes or skin necrosis: No Has patient had a PCN reaction that required hospitalization No Has patient had a PCN reaction occurring within the last 10 years: No If all of the above answers are NO, then may proceed with Cephalosporin use.     Toradol  [Ketorolac  Tromethamine ] Hives   Hydromorphone      Other reaction(s): Hallucination   Lamotrigine Rash    Family History  Problem Relation Age of Onset   Obesity Father    Hyperlipidemia Father    Diabetes Father    Heart disease Father 45   Hypertension Father    Sleep apnea Father    Breast cancer Paternal Aunt        ? age of dx   Cancer Maternal Grandfather        colon cancer (38's)   Diabetes Paternal Grandmother    Diabetes Paternal Grandfather    Cancer Cousin        breast   Breast cancer Cousin    Breast cancer Cousin        paternal   Breast cancer Cousin    Liver cancer Neg Hx     Prior to Admission medications   Medication Sig Start Date End Date Taking? Authorizing Provider  ACCU-CHEK GUIDE test strip USE AS DIRECTED TO MONITOR FSBS 1X DAILY. DX: R73.09. 02/01/21   Duanne Butler DASEN, MD  amLODipine  (NORVASC ) 10 MG tablet Take 1 tablet (10 mg total) by mouth daily. 05/07/23   Okey Vina GAILS, MD  b complex vitamins capsule With 500mcg b12 daily 05/28/23   Midge Sober, DO  Blood Glucose Monitoring Suppl (BLOOD GLUCOSE SYSTEM PAK) KIT Please dispense based on patient and insurance preference. Use as directed to monitor FSBS 1x daily. Dx: R73.09. 12/01/19   Duanne Butler DASEN, MD  estradiol  (VIVELLE -DOT) 0.1 MG/24HR patch Place 1 patch (0.1 mg total) onto the skin 2 (two) times a week. 06/07/23   Signa Delon LABOR, NP  hydrochlorothiazide   (HYDRODIURIL ) 25 MG tablet Take 1 tablet (25 mg total) by mouth daily. 02/17/22   Duanne Butler DASEN, MD  HYDROcodone -acetaminophen  (NORCO) 10-325 MG tablet Take 1 tablet by mouth every 4 (four) hours as needed for severe pain. 08/04/22   Colon Shove, MD  Lancets MISC Please dispense based on patient and insurance preference. Use as directed to monitor FSBS 1x daily. Dx: R73.09. 12/01/19   Duanne Butler DASEN, MD  losartan  (COZAAR ) 50 MG tablet Take 1 tablet (50 mg total) by mouth daily. 04/10/23   Okey Vina GAILS,  MD  nystatin -triamcinolone  ointment (MYCOLOG) Use bid for 2 weeks then stop can repeat if necessary 06/06/23   Signa Delon LABOR, NP  ondansetron  (ZOFRAN ) 4 MG tablet TAKE 1 TABLET BY MOUTH EVERY 8 HOURS AS NEEDED FOR NAUSEA AND VOMITING 04/28/22   Duanne Butler DASEN, MD  ORACEA  40 MG CPDR Take 40 mg by mouth daily. 08/09/23   Duanne Butler DASEN, MD  rosuvastatin  (CRESTOR ) 20 MG tablet Take 1 tablet (20 mg total) by mouth daily. 05/29/23   Okey Vina GAILS, MD  tirzepatide  (MOUNJARO ) 2.5 MG/0.5ML Pen Inject 2.5 mg into the skin once a week. 12/05/23   Midge Sober, DO  venlafaxine  XR (EFFEXOR -XR) 150 MG 24 hr capsule Take 2 capsules (300 mg total) by mouth daily with breakfast. 09/03/23   Duanne Butler DASEN, MD  Vitamin D , Ergocalciferol , (DRISDOL ) 1.25 MG (50000 UNIT) CAPS capsule Take 1 capsule (50,000 Units total) by mouth every 7 (seven) days. 12/05/23   Midge Sober, DO  zonisamide  (ZONEGRAN ) 100 MG capsule Take 200mg  daily at bedtime 12/04/23   Cary No, NP    Physical Exam: Vitals:   01/01/24 0639 01/01/24 0640 01/01/24 0953  BP:  105/81 124/87  Pulse:  (!) 114 97  Resp:  18 18  Temp:  97.9 F (36.6 C)   TempSrc:  Oral   SpO2:  100% 100%  Weight: 96 kg     General: Alert, oriented x3, resting comfortably in no acute distress Respiratory: Lungs clear to auscultation bilaterally with normal respiratory effort; no w/r/r Cardiovascular: Regular rate and rhythm w/o m/r/g Abdomen: Soft,  mildly distended, and TTP diffusely. Positive bowel sounds   Data Reviewed:  Lab Results  Component Value Date   WBC 17.4 (H) 01/01/2024   HGB 17.2 (H) 01/01/2024   HCT 52.2 (H) 01/01/2024   MCV 85.9 01/01/2024   PLT 489 (H) 01/01/2024   Lab Results  Component Value Date   GLUCOSE 165 (H) 01/01/2024   CALCIUM  9.0 01/01/2024   NA 135 01/01/2024   K 4.3 01/01/2024   CO2 16 (L) 01/01/2024   CL 106 01/01/2024   BUN 14 01/01/2024   CREATININE 0.84 01/01/2024   Lab Results  Component Value Date   ALT 22 01/01/2024   AST 26 01/01/2024   ALKPHOS 131 (H) 01/01/2024   BILITOT 0.9 01/01/2024   Lab Results  Component Value Date   INR 0.96 05/18/2013   Radiology: CT ABDOMEN PELVIS W CONTRAST Result Date: 01/01/2024 EXAM: CT ABDOMEN AND PELVIS WITH CONTRAST 01/01/2024 09:09:07 AM TECHNIQUE: CT of the abdomen and pelvis was performed with the administration of 75 mL of iohexol  (OMNIPAQUE ) 350 MG/ML injection. Multiplanar reformatted images are provided for review. Automated exposure control, iterative reconstruction, and/or weight-based adjustment of the mA/kV was utilized to reduce the radiation dose to as low as reasonably achievable. COMPARISON: 08/00/2025 CLINICAL HISTORY: Abdominal pain, acute, nonlocalized; generalized pain, back pain. 75ml Omni; Patient endorses lower back pain radiating into left leg. Patient states she thinks she has a ruptured disc. Patient has history of back problems. Patient states this pain started 2.5 weeks ago. Patient also reports diarrhea since Saturday. FINDINGS: LOWER CHEST: No acute abnormality. LIVER: The liver is unremarkable. GALLBLADDER AND BILE DUCTS: Status post cholecystectomy. No biliary ductal dilatation. SPLEEN: No acute abnormality. PANCREAS: No acute abnormality. ADRENAL GLANDS: No acute abnormality. KIDNEYS, URETERS AND BLADDER: Bilateral nonobstructive nephrolithiasis. No stones in the ureters. No hydronephrosis. No perinephric or  periureteral stranding. Urinary bladder is unremarkable. GI AND BOWEL: Status  post appendectomy. Moderately dilated small bowel loops are noted centrally concerning for distal small bowel obstruction. No colonic dilatation is noted. There is some twisting of mesentery noted suggesting some degree of malrotation. Stomach demonstrates no acute abnormality. PERITONEUM AND RETROPERITONEUM: Mild amount of free fluid is noted in the pelvis potentially related to small bowel abnormality. No free air. VASCULATURE: Aorta is normal in caliber. LYMPH NODES: No lymphadenopathy. REPRODUCTIVE ORGANS: Status post hysterectomy. BONES AND SOFT TISSUES: Surgical changes are noted in the lumbar spine. No acute osseous abnormality. No focal soft tissue abnormality. IMPRESSION: 1. Moderately dilated small bowel loops centrally, concerning for distal small bowel obstruction. 2. 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 3. Mild free fluid in the pelvis, potentially related to small bowel abnormality. Electronically signed by: Lynwood Seip MD 01/01/2024 09:29 AM EDT RP Workstation: [id number]   CT L-SPINE NO CHARGE Result Date: 01/01/2024 CLINICAL DATA:  Low back pain radiating to left leg. Symptoms 2 and half weeks. EXAM: CT LUMBAR SPINE WITHOUT CONTRAST TECHNIQUE: Multidetector CT imaging of the lumbar spine was performed without intravenous contrast administration. Multiplanar CT image reconstructions were also generated. RADIATION DOSE REDUCTION: This exam was performed according to the departmental dose-optimization program which includes automated exposure control, adjustment of the mA and/or kV according to patient size and/or use of iterative reconstruction technique. COMPARISON:  07/19/2022 FINDINGS: Segmentation: 5 lumbar type vertebrae. Alignment: Normal. Vertebrae: Vertebral body heights are maintained. There is mild spondylosis of the lumbar spine. Stable posterior fusion hardware at the L4-5 level  with bilateral pedicle screws. Stable midline hardware over the posterior elements of the L5-S1 level. Interval placement of left lateral fusion plate at the O7-6 level with 2 associated screws over L2 in 2 suit screws over L3 as well as interbody fusion. Interbody fusion from L3-4 to L5-S1. Small Schmorl's node inferior endplate L1 unchanged. Paraspinal and other soft tissues: Negative. Disc levels: L1-2 level is unremarkable. L2-3 level with left paraspinal fusion hardware as described intact. No obvious disc bulge or significant neural foraminal narrowing. Borderline canal stenosis. L3-4 level demonstrates minimal right-sided neural foraminal narrowing. No significant canal stenosis. No significant disc bulge. L4-5 level demonstrates no significant canal stenosis or neural foraminal narrowing. L5-S1 level demonstrates no significant canal stenosis or neural foraminal narrowing. There are multiple fluid and air-filled dilated small bowel loops measuring up to 3.7 cm in diameter incompletely evaluated on this exam. IMPRESSION: 1. No acute findings involving the lumbosacral spine. 2. Interval placement of left lateral fusion plate at the O7-6 level with associated screws and interbody fusion. 3. Stable posterior fusion hardware at the L4-5 level. 4. Stable midline hardware over the posterior elements of the L5-S1 level. 5. Minimal right-sided neural foraminal narrowing at the L3-4 level. Borderline canal stenosis at the L2-3 level. 6. Dilated small bowel loops incompletely evaluated. Referred to patient's CT abdomen/pelvis done today. Electronically Signed   By: Toribio Agreste M.D.   On: 01/01/2024 09:27    Assessment and Plan: 38F h/o bipolar disorder type 1, HTN, HLD, DM2, pseudoseizures, and OSA on CPAP p/w SBO.  SBO -EGS consulted; apprec eval/recs -MIVF: NS at 125cc/h for now -IV tylenol  1g QID -IV morphine  1g q4h prn  HTN HOLD pta amlodipine  10mg  daily, losartan  50mg  daily and hydrochlorothiazide   25mg  daily  DM2 -SSI q4h prn while NPO  Advance Care Planning:   Code Status: Full Code   Consults: EGS  Family Communication: Spouse  Severity of Illness: The appropriate patient status for  this patient is INPATIENT. Inpatient status is judged to be reasonable and necessary in order to provide the required intensity of service to ensure the patient's safety. The patient's presenting symptoms, physical exam findings, and initial radiographic and laboratory data in the context of their chronic comorbidities is felt to place them at high risk for further clinical deterioration. Furthermore, it is not anticipated that the patient will be medically stable for discharge from the hospital within 2 midnights of admission.   * I certify that at the point of admission it is my clinical judgment that the patient will require inpatient hospital care spanning beyond 2 midnights from the point of admission due to high intensity of service, high risk for further deterioration and high frequency of surveillance required.*   ------- I spent 55 minutes reviewing previous notes, at the bedside counseling/discussing the treatment plan, and performing clinical documentation.  Author: Marsha Ada, MD 01/01/2024 10:56 AM  For on call review [email or web address].

## 2024-01-01 NOTE — ED Triage Notes (Addendum)
 Patient endorses lower back pain radiating into left leg. Patient states she thinks she has a ruptured disc.  Patient has history of back problems.  Patient states this pain started 2.5 weeks ago.  Patient also reports diarrhea since Saturday.  Patient stated that she got up this morning and about passed out due to the pain and decided to come to ER.  Patient reports her PCP put her on prednisone  for this and she hasn't taken it since Saturday because it makes her irritable.

## 2024-01-01 NOTE — Consult Note (Signed)
 [name] 1965/05/15  [id number].    Requesting MD: Dr. Marsha Ada Chief Complaint/Reason for Consult: SBO  HPI:  This is a 58 yo female with a history of DM, HTN, anxiety, pseudoseizures, chronic back pain, s/p multiple fusions, bipolar 1 disorder, HLD, and OSA who has been having worsening back pain for the last 2 weeks with pain down her leg and some numbness.  She took prednisone  for this recently, but continues to have sciatica type symptoms.  She states that her pain originates in her back and comes around the sides and really hadn't noticed any abdominal pain til this morning.  This past Saturday she began having diarrhea and at times uncontrollable.  Her last BM was this morning around 545am.  She admits to some nausea now, but prior to this no nausea, vomiting, or inability to eat.  She began having some abdominal discomfort this morning with bloating.  She felt like this was all coming from her back and presented to [facility] for evaluation.  She has been found to have a WBC of 17K, hgb of 17, with a relatively  normal CMET.  Her CT of her A/P reveals a distal small bowel obstruction with some twisting of the mesentery and some mild free fluid in the pelvis.  She had a CT L-spine that was overall stable.  We have been asked to see her for further evaluation.  ROS: ROS: see HPI  Family History  Problem Relation Age of Onset   Obesity Father    Hyperlipidemia Father    Diabetes Father    Heart disease Father 17   Hypertension Father    Sleep apnea Father    Breast cancer Paternal Aunt        ? age of dx   Cancer Maternal Grandfather        colon cancer (14's)   Diabetes Paternal Grandmother    Diabetes Paternal Grandfather    Cancer Cousin        breast   Breast cancer Cousin    Breast cancer Cousin        paternal   Breast cancer Cousin    Liver cancer Neg Hx     Past Medical History:  Diagnosis Date   Anxiety    Anxiety    Back pain    Bipolar 1 disorder  (HCC)    Bipolar disorder (HCC)    Chest pain    Constipation    Depression    Depression    Diabetes (HCC)    Diabetes mellitus without complication (HCC)    Type 2   Dyspnea    r/t anxiety   Encounter for neuropsychological testing    at [facility] 3/20-suggest possible somatization   Fatty liver    GERD (gastroesophageal reflux disease)    Headache    High blood pressure    High cholesterol    Hypertension    Joint pain    Migraines    OSA on CPAP    moderate obstructive sleep apnea with an AHI of 21.3/h and no central events.  Her supine AHI was 31.3/hr.  O2 saturations were as low as 86%..  On CPAP at 15cm H2O   Pseudoseizure    Last seizure March/April 2024   PTSD (post-traumatic stress disorder)    Rheumatoid arthritis (HCC)    Rosacea    Seizures (HCC)    [facility] (Dr. Euna) complex partial seizure with epileptiform discharges seen in fronto-central region on  eeg   Seizures (HCC)    Shortness of breath    Swelling of both lower extremities     Past Surgical History:  Procedure Laterality Date   ABDOMINAL HYSTERECTOMY     non cancerous, partial. 1990s   ANTERIOR LAT LUMBAR FUSION N/A 08/03/2022   Procedure: Lumbar two-three extreme Lateral Interbody Fusion;  Surgeon: Colon Shove, MD;  Location: [facility] OR;  Service: Neurosurgery;  Laterality: N/A;   APPENDECTOMY     BACK SURGERY     x2   CHOLECYSTECTOMY     COLONOSCOPY WITH PROPOFOL  N/A 07/20/2021   Surgeon: Shaaron Lamar HERO, MD;  Three 6-8 mm polyps in the descending colon and cecum removed, diverticulosis in the entire colon, otherwise normal exam.  Pathology with tubular adenomas.  Recommended 5-year surveillance.   CYST EXCISION     on ovaries   lumbar     ruptured disc in lumbar taken out   POLYPECTOMY  07/20/2021   Procedure: POLYPECTOMY;  Surgeon: Shaaron Lamar HERO, MD;  Location: [facility];  Service: Endoscopy;;   ROTATOR CUFF REPAIR Left    TONSILLECTOMY     TUBAL LIGATION      Social  History:  reports that she quit smoking about 32 years ago. Her smoking use included cigarettes. She started smoking about 42 years ago. She has a 5 pack-year smoking history. She has never used smokeless tobacco. She reports current alcohol use. She reports that she does not use drugs.  Allergies:  Allergies  Allergen Reactions   Penicillins Hives    Has patient had a PCN reaction causing immediate rash, facial/tongue/throat swelling, SOB or lightheadedness with hypotension: Yes Has patient had a PCN reaction causing severe rash involving mucus membranes or skin necrosis: No Has patient had a PCN reaction that required hospitalization No Has patient had a PCN reaction occurring within the last 10 years: No If all of the above answers are NO, then may proceed with Cephalosporin use.     Toradol  [Ketorolac  Tromethamine ] Hives   Hydromorphone      Other reaction(s): Hallucination   Lamotrigine Rash    (Not in a hospital admission)    Physical Exam: Blood pressure 124/87, pulse 97, temperature 97.9 F (36.6 C), temperature source Oral, resp. rate 18, weight 96 kg, SpO2 100%. General: pleasant, WD, WN white female who is laying in bed in NAD HEENT: head is normocephalic, atraumatic.  Sclera are noninjected.  PERRL.  Ears and nose without any masses or lesions.  Mouth is pink and dry Heart: regular, rate, and rhythm.  Normal s1,s2. No obvious murmurs, gallops, or rubs noted.   Lungs: CTAB, no wheezes, rhonchi, or rales noted.  Respiratory effort nonlabored Abd: soft, with some distention, especially in her upper abdomen, mild tenderness diffusely, but greater in central abdomen, +BS, no masses, hernias, or organomegaly.  No peritonitis or rebound.  RLQ open scar from appendectomy Psych: A&Ox3 with an appropriate affect.   Results for orders placed or performed during the hospital encounter of 01/01/24 (from the past 48 hours)  CBC with Differential     Status: Abnormal   Collection  Time: 01/01/24  7:05 AM  Result Value Ref Range   WBC 17.4 (H) 4.0 - 10.5 K/uL   RBC 6.08 (H) 3.87 - 5.11 MIL/uL   Hemoglobin 17.2 (H) 12.0 - 15.0 g/dL   HCT 47.7 (H) 63.9 - 53.9 %   MCV 85.9 80.0 - 100.0 fL   MCH 28.3 26.0 - 34.0 pg  MCHC 33.0 30.0 - 36.0 g/dL   RDW 86.2 88.4 - 84.4 %   Platelets 489 (H) 150 - 400 K/uL   nRBC 0.0 0.0 - 0.2 %   Neutrophils Relative % 77 %   Neutro Abs 13.5 (H) 1.7 - 7.7 K/uL   Lymphocytes Relative 14 %   Lymphs Abs 2.5 0.7 - 4.0 K/uL   Monocytes Relative 6 %   Monocytes Absolute 1.0 0.1 - 1.0 K/uL   Eosinophils Relative 2 %   Eosinophils Absolute 0.3 0.0 - 0.5 K/uL   Basophils Relative 0 %   Basophils Absolute 0.0 0.0 - 0.1 K/uL   Immature Granulocytes 1 %   Abs Immature Granulocytes 0.08 (H) 0.00 - 0.07 K/uL    Comment: Performed at [facility], [address] [place], [place] [address]  Comprehensive metabolic panel     Status: Abnormal   Collection Time: 01/01/24  7:05 AM  Result Value Ref Range   Sodium 135 135 - 145 mmol/L   Potassium 4.3 3.5 - 5.1 mmol/L   Chloride 106 98 - 111 mmol/L   CO2 16 (L) 22 - 32 mmol/L   Glucose, Bld 165 (H) 70 - 99 mg/dL    Comment: Glucose reference range applies only to samples taken after fasting for at least 8 hours.   BUN 14 6 - 20 mg/dL   Creatinine, Ser 9.15 0.44 - 1.00 mg/dL   Calcium  9.0 8.9 - 10.3 mg/dL   Total Protein 7.6 6.5 - 8.1 g/dL   Albumin 3.9 3.5 - 5.0 g/dL   AST 26 15 - 41 U/L   ALT 22 0 - 44 U/L   Alkaline Phosphatase 131 (H) 38 - 126 U/L   Total Bilirubin 0.9 0.0 - 1.2 mg/dL   GFR, Estimated >39 >39 mL/min    Comment: (NOTE) Calculated using the CKD-EPI Creatinine Equation (2021)    Anion gap 13 5 - 15    Comment: Performed at [facility], [address], [place], [place] [address]  Urinalysis, Routine w reflex microscopic -Urine, Clean Catch     Status: Abnormal   Collection Time: 01/01/24 11:11 AM  Result Value Ref Range   Color, Urine YELLOW YELLOW    APPearance CLEAR CLEAR   Specific Gravity, Urine >1.046 (H) 1.005 - 1.030   pH 5.0 5.0 - 8.0   Glucose, UA NEGATIVE NEGATIVE mg/dL   Hgb urine dipstick NEGATIVE NEGATIVE   Bilirubin Urine NEGATIVE NEGATIVE   Ketones, ur NEGATIVE NEGATIVE mg/dL   Protein, ur NEGATIVE NEGATIVE mg/dL   Nitrite NEGATIVE NEGATIVE   Leukocytes,Ua NEGATIVE NEGATIVE    Comment: Performed at [facility], [address] [place], [place] [address]   CT ABDOMEN PELVIS W CONTRAST Result Date: 01/01/2024 EXAM: CT ABDOMEN AND PELVIS WITH CONTRAST 01/01/2024 09:09:07 AM TECHNIQUE: CT of the abdomen and pelvis was performed with the administration of 75 mL of iohexol  (OMNIPAQUE ) 350 MG/ML injection. Multiplanar reformatted images are provided for review. Automated exposure control, iterative reconstruction, and/or weight-based adjustment of the mA/kV was utilized to reduce the radiation dose to as low as reasonably achievable. COMPARISON: 08/00/2025 CLINICAL HISTORY: Abdominal pain, acute, nonlocalized; generalized pain, back pain. 75ml Omni; Patient endorses lower back pain radiating into left leg. Patient states she thinks she has a ruptured disc. Patient has history of back problems. Patient states this pain started 2.5 weeks ago. Patient also reports diarrhea since Saturday. FINDINGS: LOWER CHEST: No acute abnormality. LIVER: The liver is  unremarkable. GALLBLADDER AND BILE DUCTS: Status post cholecystectomy. No biliary ductal dilatation. SPLEEN: No acute abnormality. PANCREAS: No acute abnormality. ADRENAL GLANDS: No acute abnormality. KIDNEYS, URETERS AND BLADDER: Bilateral nonobstructive nephrolithiasis. No stones in the ureters. No hydronephrosis. No perinephric or periureteral stranding. Urinary bladder is unremarkable. GI AND BOWEL: Status post appendectomy. Moderately dilated small bowel loops are noted centrally concerning for distal small bowel obstruction. No colonic dilatation is noted. There is some twisting of  mesentery noted suggesting some degree of malrotation. Stomach demonstrates no acute abnormality. PERITONEUM AND RETROPERITONEUM: Mild amount of free fluid is noted in the pelvis potentially related to small bowel abnormality. No free air. VASCULATURE: Aorta is normal in caliber. LYMPH NODES: No lymphadenopathy. REPRODUCTIVE ORGANS: Status post hysterectomy. BONES AND SOFT TISSUES: Surgical changes are noted in the lumbar spine. No acute osseous abnormality. No focal soft tissue abnormality. IMPRESSION: 1. Moderately dilated small bowel loops centrally, concerning for distal small bowel obstruction. 2. 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 3. Mild free fluid in the pelvis, potentially related to small bowel abnormality. Electronically signed by: Lynwood Seip MD 01/01/2024 09:29 AM EDT RP Workstation: [id number]   CT L-SPINE NO CHARGE Result Date: 01/01/2024 CLINICAL DATA:  Low back pain radiating to left leg. Symptoms 2 and half weeks. EXAM: CT LUMBAR SPINE WITHOUT CONTRAST TECHNIQUE: Multidetector CT imaging of the lumbar spine was performed without intravenous contrast administration. Multiplanar CT image reconstructions were also generated. RADIATION DOSE REDUCTION: This exam was performed according to the departmental dose-optimization program which includes automated exposure control, adjustment of the mA and/or kV according to patient size and/or use of iterative reconstruction technique. COMPARISON:  07/19/2022 FINDINGS: Segmentation: 5 lumbar type vertebrae. Alignment: Normal. Vertebrae: Vertebral body heights are maintained. There is mild spondylosis of the lumbar spine. Stable posterior fusion hardware at the L4-5 level with bilateral pedicle screws. Stable midline hardware over the posterior elements of the L5-S1 level. Interval placement of left lateral fusion plate at the O7-6 level with 2 associated screws over L2 in 2 suit screws over L3 as well as interbody fusion.  Interbody fusion from L3-4 to L5-S1. Small Schmorl's node inferior endplate L1 unchanged. Paraspinal and other soft tissues: Negative. Disc levels: L1-2 level is unremarkable. L2-3 level with left paraspinal fusion hardware as described intact. No obvious disc bulge or significant neural foraminal narrowing. Borderline canal stenosis. L3-4 level demonstrates minimal right-sided neural foraminal narrowing. No significant canal stenosis. No significant disc bulge. L4-5 level demonstrates no significant canal stenosis or neural foraminal narrowing. L5-S1 level demonstrates no significant canal stenosis or neural foraminal narrowing. There are multiple fluid and air-filled dilated small bowel loops measuring up to 3.7 cm in diameter incompletely evaluated on this exam. IMPRESSION: 1. No acute findings involving the lumbosacral spine. 2. Interval placement of left lateral fusion plate at the O7-6 level with associated screws and interbody fusion. 3. Stable posterior fusion hardware at the L4-5 level. 4. Stable midline hardware over the posterior elements of the L5-S1 level. 5. Minimal right-sided neural foraminal narrowing at the L3-4 level. Borderline canal stenosis at the L2-3 level. 6. Dilated small bowel loops incompletely evaluated. Referred to patient's CT abdomen/pelvis done today. Electronically Signed   By: Toribio Agreste M.D.   On: 01/01/2024 09:27      Assessment/Plan SBO with some mesenteric swirling The patient has been seen, examined, labs, vitals, chart, and imaging personally reviewed.  The patient has evidence of a small bowel obstruction on her imaging along with  some swirling of her mesentery.  Her abdominal exam is relatively benign at this time and suspect her WBC elevation is secondary to dehydration given her hgb of 17, but will check a lactic acid and monitor her closely for need for OR given this findings.  In the interim we will decompress her with an NGT.  SBO protocol has been ordered as  well.  I do not think her back pain is related to the findings in her abdomen.  I did see Dr. Colon and made him aware at the patient's request.  Recommend IVF hydration and resuscitation.  Agree with medical admission and we will closely  monitor and follow for possible operative intervention vs improvement with conservative measures.  Discussed all of these possibilities with the patient and her mother at bedside.   FEN - NPO/NGT/IVFs VTE - lovenox  ID - none needed  Back pain DM HTN Pseudoseizures Bipolar 1 disorder Anxiety OSA  I reviewed nursing notes, last 24 h vitals and pain scores, last 24 h labs and trends, last 24 h imaging results, and discussed with hospitalist and neurosurgery.  Burnard FORBES Banter, [facility] 01/01/2024, 11:46 AM Please see Amion for pager number during day hours 7:00am-4:30pm or 7:00am -11:30am on weekends

## 2024-01-01 NOTE — TOC CM/SW Note (Signed)
 Transition of Care [facility]) - Inpatient Brief Assessment   Patient Details  Name: [name] MRN: [id number] Date of Birth: November 30, 1965  [facility] CM/SW Contact:    Lauraine FORBES Saa, LCSWA Phone Number: 01/01/2024, 3:22 PM   Clinical Narrative:  3:22 PM Per chart review, patient resides at home with spouse. Patient has a PCP and insurance. Patient does not have SNF history. Patient has HH history with [facility]. Patient has DME (CPAP, BSC, RW) history with [facility] and [facility]. Patient's preferred pharmacy is [facility] [id number]. No TOC needs identified at this time. TOC will continue to follow.  Transition of Care Asessment: Insurance and Status: Insurance coverage has been reviewed Patient has primary care physician: Yes Home environment has been reviewed: Private Residence Prior level of function:: N/A Prior/Current Home Services: No current home services Social Drivers of Health Review: SDOH reviewed no interventions necessary Readmission risk has been reviewed: Yes (Currently Green 8%) Transition of care needs: no transition of care needs at this time

## 2024-01-01 NOTE — ED Notes (Signed)
 PT had a watery bowel movement. Pt and bed cleaned and changed.

## 2024-01-01 NOTE — ED Notes (Signed)
 Pt had another watery bowel movement. Pt unable to control bowels. Pt cleaned and bedding changed.

## 2024-01-01 NOTE — ED Notes (Signed)
  at bedside

## 2024-01-02 ENCOUNTER — Inpatient Hospital Stay (HOSPITAL_COMMUNITY)

## 2024-01-02 DIAGNOSIS — K56609 Unspecified intestinal obstruction, unspecified as to partial versus complete obstruction: Secondary | ICD-10-CM | POA: Diagnosis not present

## 2024-01-02 LAB — BASIC METABOLIC PANEL WITH GFR
Anion gap: 10 (ref 5–15)
BUN: 9 mg/dL (ref 6–20)
CO2: 20 mmol/L — ABNORMAL LOW (ref 22–32)
Calcium: 7.6 mg/dL — ABNORMAL LOW (ref 8.9–10.3)
Chloride: 110 mmol/L (ref 98–111)
Creatinine, Ser: 0.65 mg/dL (ref 0.44–1.00)
GFR, Estimated: >60 mL/min (ref >60)
Glucose, Bld: 112 mg/dL — ABNORMAL HIGH (ref 70–99)
Potassium: 3.3 mmol/L — ABNORMAL LOW (ref 3.5–5.1)
Sodium: 140 mmol/L (ref 135–145)

## 2024-01-02 LAB — GLUCOSE, CAPILLARY
Glucose-Capillary: 100 mg/dL — ABNORMAL HIGH (ref 70–99)
Glucose-Capillary: 106 mg/dL — ABNORMAL HIGH (ref 70–99)
Glucose-Capillary: 112 mg/dL — ABNORMAL HIGH (ref 70–99)
Glucose-Capillary: 128 mg/dL — ABNORMAL HIGH (ref 70–99)
Glucose-Capillary: 75 mg/dL (ref 70–99)
Glucose-Capillary: 84 mg/dL (ref 70–99)

## 2024-01-02 LAB — MAGNESIUM: Magnesium: 1.8 mg/dL (ref 1.7–2.4)

## 2024-01-02 MED ORDER — LORAZEPAM 2 MG/ML IJ SOLN
1.0000 mg | Freq: Once | INTRAMUSCULAR | Status: AC
Start: 2024-01-02 — End: 2024-01-03
  Administered 2024-01-03: 1 mg via INTRAVENOUS
  Filled 2024-01-02 (×2): qty 1

## 2024-01-02 MED ORDER — METHOCARBAMOL 500 MG PO TABS: 500.0000 mg | ORAL_TABLET | Freq: Every evening | ORAL | Status: DC | PRN

## 2024-01-02 MED ORDER — INSULIN ASPART 100 UNIT/ML IJ SOLN: 0.0000 [IU] | Freq: Every day | INTRAMUSCULAR | Status: DC

## 2024-01-02 MED ORDER — INSULIN ASPART 100 UNIT/ML IJ SOLN
0.0000 [IU] | Freq: Three times a day (TID) | INTRAMUSCULAR | Status: DC
  Administered 2024-01-02: 2 [IU] via SUBCUTANEOUS
  Administered 2024-01-04: 3 [IU] via SUBCUTANEOUS

## 2024-01-02 MED ORDER — METHOCARBAMOL 500 MG PO TABS
500.0000 mg | ORAL_TABLET | Freq: Four times a day (QID) | ORAL | Status: DC
  Administered 2024-01-02 – 2024-01-04 (×8): 500 mg via ORAL
  Filled 2024-01-02 (×9): qty 1

## 2024-01-02 MED ORDER — LIDOCAINE 5 % EX PTCH
1.0000 | MEDICATED_PATCH | CUTANEOUS | Status: DC
  Administered 2024-01-02 – 2024-01-04 (×3): 1 via TRANSDERMAL
  Filled 2024-01-02 (×3): qty 1

## 2024-01-02 MED ORDER — POTASSIUM CHLORIDE CRYS ER 20 MEQ PO TBCR
40.0000 meq | EXTENDED_RELEASE_TABLET | Freq: Once | ORAL | Status: AC
  Administered 2024-01-02: 40 meq via ORAL
  Filled 2024-01-02: qty 2

## 2024-01-02 NOTE — Progress Notes (Addendum)
 PROGRESS NOTE    [name]  FMW:[id number] DOB: 1965/11/28 DOA: 01/01/2024 PCP: Duanne Butler DASEN, MD    Brief Narrative:  53F h/o bipolar disorder type 1, HTN, HLD, DM2, pseudoseizures, and OSA on CPAP p/w SBO.   Assessment and Plan: SBO -GS consulted; apprec eval/recs - Positive diarrhea, clear liquid diet per general surgery - Ambulate patient   HTN -Resume blood pressure medicines as needed   DM2 -SSI  Back pain -MRI -NS to see   DVT prophylaxis: enoxaparin  (LOVENOX ) injection 40 mg Start: 01/01/24 1100    Code Status: Full Code Family Communication:   Disposition Plan:  Level of care: Med-Surg Status is: Inpatient     Consultants:  General surgery   Subjective: Positive bowel movements Patient states she has not been hooked back up to suction since yesterday  Objective: Vitals:   01/01/24 1422 01/01/24 2041 01/02/24 0527 01/02/24 1009  BP: 117/88 112/71 110/68 121/72  Pulse: 99 88 88   Resp: 16 18 16 18   Temp: 97.6 F (36.4 C) 97.7 F (36.5 C) (!) 97.4 F (36.3 C) 97.9 F (36.6 C)  TempSrc: Axillary Oral Oral Oral  SpO2:  100% 100% 100%  Weight: 93.5 kg     Height: 5' 6.5 (1.689 m)       Intake/Output Summary (Last 24 hours) at 01/02/2024 1052 Last data filed at 01/01/2024 1545 Gross per 24 hour  Intake 1253.98 ml  Output --  Net 1253.98 ml   Filed Weights   01/01/24 0639 01/01/24 1422  Weight: 96 kg 93.5 kg    Examination:   General: Appearance:    Obese female in no acute distress     Lungs:     respirations unlabored  Heart:    Normal heart rate.     MS:   All extremities are intact.    Neurologic:   Awake, alert       Data Reviewed: I have personally reviewed following labs and imaging studies  CBC: Recent Labs  Lab 01/01/24 0705 01/01/24 1133  WBC 17.4* 13.9*  NEUTROABS 13.5*  --   HGB 17.2* 16.0*  HCT 52.2* 48.5*  MCV 85.9 86.9  PLT 489* 415*   Basic Metabolic Panel: Recent Labs  Lab  01/01/24 0705 01/01/24 1133 01/02/24 0910  NA 135  --  140  K 4.3  --  3.3*  CL 106  --  110  CO2 16*  --  20*  GLUCOSE 165*  --  112*  BUN 14  --  9  CREATININE 0.84 0.72 0.65  CALCIUM  9.0  --  7.6*   GFR: Estimated Creatinine Clearance: 91.4 mL/min (by C-G formula based on SCr of 0.65 mg/dL). Liver Function Tests: Recent Labs  Lab 01/01/24 0705  AST 26  ALT 22  ALKPHOS 131*  BILITOT 0.9  PROT 7.6  ALBUMIN 3.9   No results for input(s): LIPASE, AMYLASE in the last 168 hours. No results for input(s): AMMONIA in the last 168 hours. Coagulation Profile: No results for input(s): INR, PROTIME in the last 168 hours. Cardiac Enzymes: No results for input(s): CKTOTAL, CKMB, CKMBINDEX, TROPONINI in the last 168 hours. BNP (last 3 results) No results for input(s): PROBNP in the last 8760 hours. HbA1C: No results for input(s): HGBA1C in the last 72 hours. CBG: Recent Labs  Lab 01/01/24 1738 01/01/24 2042 01/02/24 0034 01/02/24 0525 01/02/24 0842  GLUCAP 100* 108* 112* 106* 100*   Lipid Profile: No results for input(s): CHOL, HDL, LDLCALC,  TRIG, CHOLHDL, LDLDIRECT in the last 72 hours. Thyroid  Function Tests: No results for input(s): TSH, T4TOTAL, FREET4, T3FREE, THYROIDAB in the last 72 hours. Anemia Panel: No results for input(s): VITAMINB12, FOLATE, FERRITIN, TIBC, IRON, RETICCTPCT in the last 72 hours. Sepsis Labs: Recent Labs  Lab 01/01/24 1241  LATICACIDVEN 1.2    No results found for this or any previous visit (from the past 240 hours).       Radiology Studies: DG Abd Portable 1V-Small Bowel Obstruction Protocol-initial, 8 hr delay Result Date: 01/02/2024 EXAM: 1 VIEW XRAY OF THE ABDOMEN 01/02/2024 02:30:46 AM COMPARISON: X-ray abdomen dated 12/12/23. CLINICAL HISTORY: FINDINGS: BOWEL: Stable mild gaseous distention of small bowel and colon. Po contrast now seen throughout the colon to the level of  the rectum. SOFT TISSUES: Enteric tube with tip and side port overlying the expected region of the gastric lumen. Right upper quadrant surgical clips. BONES: Lumbar and cervical spine surgical hardware. No acute osseous abnormality. IMPRESSION: 1. Stable mild gaseous distention of small bowel and colon, likely due to mild ileus. No evidence of bowel obstruction . Electronically signed by: Norleen Kil MD 01/02/2024 07:25 AM EDT RP Workstation: [id number]   DG Abd Portable 1V-Small Bowel Protocol-Position Verification Result Date: 01/01/2024 EXAM: SUPINE XRAY VIEWS OF THE ABDOMEN 01/01/2024 12:10:44 PM COMPARISON: None available. CLINICAL HISTORY: [id number] Encounter for imaging study to confirm nasogastric (NG) tube placement [id number]. Encounter for NG tube placement. Encounter for imaging study to confirm nasogastric (NG) tube placement [id number]. Encounter for NG tube placement. FINDINGS: BOWEL: Nasogastric tube tip seen in proximal stomach. The bowel gas pattern is nonspecific. No bowel obstruction. PERITONEUM AND SOFT TISSUES: Status post cholecystectomy. No abnormal calcifications. No free air. BONES: No acute osseous abnormality. IMPRESSION: 1. Nasogastric tube tip appropriately positioned in the proximal stomach. Electronically signed by: Lynwood Seip MD 01/01/2024 12:45 PM EDT RP Workstation: [id number]   CT ABDOMEN PELVIS W CONTRAST Result Date: 01/01/2024 EXAM: CT ABDOMEN AND PELVIS WITH CONTRAST 01/01/2024 09:09:07 AM TECHNIQUE: CT of the abdomen and pelvis was performed with the administration of 75 mL of iohexol  (OMNIPAQUE ) 350 MG/ML injection. Multiplanar reformatted images are provided for review. Automated exposure control, iterative reconstruction, and/or weight-based adjustment of the mA/kV was utilized to reduce the radiation dose to as low as reasonably achievable. COMPARISON: 08/00/2025 CLINICAL HISTORY: Abdominal pain, acute, nonlocalized; generalized pain, back pain. 75ml Omni; Patient endorses  lower back pain radiating into left leg. Patient states she thinks she has a ruptured disc. Patient has history of back problems. Patient states this pain started 2.5 weeks ago. Patient also reports diarrhea since Saturday. FINDINGS: LOWER CHEST: No acute abnormality. LIVER: The liver is unremarkable. GALLBLADDER AND BILE DUCTS: Status post cholecystectomy. No biliary ductal dilatation. SPLEEN: No acute abnormality. PANCREAS: No acute abnormality. ADRENAL GLANDS: No acute abnormality. KIDNEYS, URETERS AND BLADDER: Bilateral nonobstructive nephrolithiasis. No stones in the ureters. No hydronephrosis. No perinephric or periureteral stranding. Urinary bladder is unremarkable. GI AND BOWEL: Status post appendectomy. Moderately dilated small bowel loops are noted centrally concerning for distal small bowel obstruction. No colonic dilatation is noted. There is some twisting of mesentery noted suggesting some degree of malrotation. Stomach demonstrates no acute abnormality. PERITONEUM AND RETROPERITONEUM: Mild amount of free fluid is noted in the pelvis potentially related to small bowel abnormality. No free air. VASCULATURE: Aorta is normal in caliber. LYMPH NODES: No lymphadenopathy. REPRODUCTIVE ORGANS: Status post hysterectomy. BONES AND SOFT TISSUES: Surgical changes are noted in the lumbar spine. No acute osseous abnormality. No  focal soft tissue abnormality. IMPRESSION: 1. Moderately dilated small bowel loops centrally, concerning for distal small bowel obstruction. 2. 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 3. Mild free fluid in the pelvis, potentially related to small bowel abnormality. Electronically signed by: Lynwood Seip MD 01/01/2024 09:29 AM EDT RP Workstation: [id number]   CT L-SPINE NO CHARGE Result Date: 01/01/2024 CLINICAL DATA:  Low back pain radiating to left leg. Symptoms 2 and half weeks. EXAM: CT LUMBAR SPINE WITHOUT CONTRAST TECHNIQUE: Multidetector CT imaging of the  lumbar spine was performed without intravenous contrast administration. Multiplanar CT image reconstructions were also generated. RADIATION DOSE REDUCTION: This exam was performed according to the departmental dose-optimization program which includes automated exposure control, adjustment of the mA and/or kV according to patient size and/or use of iterative reconstruction technique. COMPARISON:  07/19/2022 FINDINGS: Segmentation: 5 lumbar type vertebrae. Alignment: Normal. Vertebrae: Vertebral body heights are maintained. There is mild spondylosis of the lumbar spine. Stable posterior fusion hardware at the L4-5 level with bilateral pedicle screws. Stable midline hardware over the posterior elements of the L5-S1 level. Interval placement of left lateral fusion plate at the O7-6 level with 2 associated screws over L2 in 2 suit screws over L3 as well as interbody fusion. Interbody fusion from L3-4 to L5-S1. Small Schmorl's node inferior endplate L1 unchanged. Paraspinal and other soft tissues: Negative. Disc levels: L1-2 level is unremarkable. L2-3 level with left paraspinal fusion hardware as described intact. No obvious disc bulge or significant neural foraminal narrowing. Borderline canal stenosis. L3-4 level demonstrates minimal right-sided neural foraminal narrowing. No significant canal stenosis. No significant disc bulge. L4-5 level demonstrates no significant canal stenosis or neural foraminal narrowing. L5-S1 level demonstrates no significant canal stenosis or neural foraminal narrowing. There are multiple fluid and air-filled dilated small bowel loops measuring up to 3.7 cm in diameter incompletely evaluated on this exam. IMPRESSION: 1. No acute findings involving the lumbosacral spine. 2. Interval placement of left lateral fusion plate at the O7-6 level with associated screws and interbody fusion. 3. Stable posterior fusion hardware at the L4-5 level. 4. Stable midline hardware over the posterior elements  of the L5-S1 level. 5. Minimal right-sided neural foraminal narrowing at the L3-4 level. Borderline canal stenosis at the L2-3 level. 6. Dilated small bowel loops incompletely evaluated. Referred to patient's CT abdomen/pelvis done today. Electronically Signed   By: Toribio Agreste M.D.   On: 01/01/2024 09:27        Scheduled Meds:  enoxaparin  (LOVENOX ) injection  40 mg Subcutaneous Q24H   insulin  aspart  0-15 Units Subcutaneous Q4H   lidocaine   1 patch Transdermal Q24H   Continuous Infusions:  sodium chloride  125 mL/hr at 01/01/24 1545   acetaminophen  1,000 mg (01/02/24 0606)     LOS: 1 day    Time spent: 45 minutes spent on chart review, discussion with nursing staff, consultants, updating family and interview/physical exam; more than 50% of that time was spent in counseling and/or coordination of care.    Harlene RAYMOND Bowl, DO [facility] Available via Epic secure chat 7am-7pm After these hours, please refer to coverage provider listed on [email or web address] 01/02/2024, 10:52 AM

## 2024-01-02 NOTE — Consult Note (Signed)
 Reason for Consult: Back pain with left lumbar radiculopathy Referring Physician: Dr. Fleeta Therisa [name] [name] is an 58 y.o. female.  HPI: Patient is a 58 year old female known to my service having had extensive lumbar spondylosis in the past.  She has had a decompression and fusion from L2 down to the sacrum over the years.  She notes that this past week she developed increasing back pain and started having pain into the left thigh and hip she notes that there was some numbness that was involved with this.  Then a couple days ago she developed significant diarrhea and ultimately was seen in the emergency department CT scan lumbar spine demonstrates the patient has solid arthrodesis from L2 to the sacrum L1-L2 the adjacent joint appears to be fairly stable small bowel series demonstrates that the patient has small bowel obstruction and she has been admitted for that process.  He is seen now for further evaluation of the back and left lower extremity pain which still continues to be problematic for her.  Past Medical History:  Diagnosis Date   Anxiety    Anxiety    Back pain    Bipolar 1 disorder (HCC)    Bipolar disorder (HCC)    Chest pain    Constipation    Depression    Depression    Diabetes (HCC)    Diabetes mellitus without complication (HCC)    Type 2   Dyspnea    r/t anxiety   Encounter for neuropsychological testing    at [facility] 3/20-suggest possible somatization   Fatty liver    GERD (gastroesophageal reflux disease)    Headache    High blood pressure    High cholesterol    Hypertension    Joint pain    Migraines    OSA on CPAP    moderate obstructive sleep apnea with an AHI of 21.3/h and no central events.  Her supine AHI was 31.3/hr.  O2 saturations were as low as 86%..  On CPAP at 15cm H2O   Pseudoseizure    Last seizure March/April 2024   PTSD (post-traumatic stress disorder)    Rheumatoid arthritis (HCC)    Rosacea    Seizures (HCC)    [name] (Dr. Euna)  complex partial seizure with epileptiform discharges seen in fronto-central region on eeg   Seizures (HCC)    Shortness of breath    Swelling of both lower extremities     Past Surgical History:  Procedure Laterality Date   ABDOMINAL HYSTERECTOMY     non cancerous, partial. 1990s   ANTERIOR LAT LUMBAR FUSION N/A 08/03/2022   Procedure: Lumbar two-three extreme Lateral Interbody Fusion;  Surgeon: Colon Shove, MD;  Location: [facility] OR;  Service: Neurosurgery;  Laterality: N/A;   APPENDECTOMY     BACK SURGERY     x2   CHOLECYSTECTOMY     COLONOSCOPY WITH PROPOFOL  N/A 07/20/2021   Surgeon: Shaaron Lamar HERO, MD;  Three 6-8 mm polyps in the descending colon and cecum removed, diverticulosis in the entire colon, otherwise normal exam.  Pathology with tubular adenomas.  Recommended 5-year surveillance.   CYST EXCISION     on ovaries   lumbar     ruptured disc in lumbar taken out   POLYPECTOMY  07/20/2021   Procedure: POLYPECTOMY;  Surgeon: Shaaron Lamar HERO, MD;  Location: [facility];  Service: Endoscopy;;   ROTATOR CUFF REPAIR Left    TONSILLECTOMY     TUBAL LIGATION  Family History  Problem Relation Age of Onset   Obesity Father    Hyperlipidemia Father    Diabetes Father    Heart disease Father 89   Hypertension Father    Sleep apnea Father    Breast cancer Paternal Aunt        ? age of dx   Cancer Maternal Grandfather        colon cancer (40's)   Diabetes Paternal Grandmother    Diabetes Paternal Grandfather    Cancer Cousin        breast   Breast cancer Cousin    Breast cancer Cousin        paternal   Breast cancer Cousin    Liver cancer Neg Hx     Social History:  reports that she quit smoking about 32 years ago. Her smoking use included cigarettes. She started smoking about 42 years ago. She has a 5 pack-year smoking history. She has never used smokeless tobacco. She reports current alcohol use. She reports that she does not use drugs.  Allergies:  Allergies   Allergen Reactions   Penicillins Hives   Toradol  [Ketorolac  Tromethamine ] Hives   Dilaudid  [Hydromorphone ] Other (See Comments)    Hallucinations   Prednisone  Other (See Comments)    Agitation Irritability   Lamictal [Lamotrigine] Rash    Medications: I have reviewed the patient's current medications.  Results for orders placed or performed during the hospital encounter of 01/01/24 (from the past 48 hours)  CBC with Differential     Status: Abnormal   Collection Time: 01/01/24  7:05 AM  Result Value Ref Range   WBC 17.4 (H) 4.0 - 10.5 K/uL   RBC 6.08 (H) 3.87 - 5.11 MIL/uL   Hemoglobin 17.2 (H) 12.0 - 15.0 g/dL   HCT 47.7 (H) 63.9 - 53.9 %   MCV 85.9 80.0 - 100.0 fL   MCH 28.3 26.0 - 34.0 pg   MCHC 33.0 30.0 - 36.0 g/dL   RDW 86.2 88.4 - 84.4 %   Platelets 489 (H) 150 - 400 K/uL   nRBC 0.0 0.0 - 0.2 %   Neutrophils Relative % 77 %   Neutro Abs 13.5 (H) 1.7 - 7.7 K/uL   Lymphocytes Relative 14 %   Lymphs Abs 2.5 0.7 - 4.0 K/uL   Monocytes Relative 6 %   Monocytes Absolute 1.0 0.1 - 1.0 K/uL   Eosinophils Relative 2 %   Eosinophils Absolute 0.3 0.0 - 0.5 K/uL   Basophils Relative 0 %   Basophils Absolute 0.0 0.0 - 0.1 K/uL   Immature Granulocytes 1 %   Abs Immature Granulocytes 0.08 (H) 0.00 - 0.07 K/uL    Comment: Performed at [facility], [address] [place], [place] [address]  Comprehensive metabolic panel     Status: Abnormal   Collection Time: 01/01/24  7:05 AM  Result Value Ref Range   Sodium 135 135 - 145 mmol/L   Potassium 4.3 3.5 - 5.1 mmol/L   Chloride 106 98 - 111 mmol/L   CO2 16 (L) 22 - 32 mmol/L   Glucose, Bld 165 (H) 70 - 99 mg/dL    Comment: Glucose reference range applies only to samples taken after fasting for at least 8 hours.   BUN 14 6 - 20 mg/dL   Creatinine, Ser 9.15 0.44 - 1.00 mg/dL   Calcium  9.0 8.9 - 10.3 mg/dL   Total Protein 7.6 6.5 - 8.1 g/dL   Albumin 3.9 3.5 - 5.0  g/dL   AST 26 15 - 41 U/L   ALT 22 0 - 44 U/L    Alkaline Phosphatase 131 (H) 38 - 126 U/L   Total Bilirubin 0.9 0.0 - 1.2 mg/dL   GFR, Estimated >39 >39 mL/min    Comment: (NOTE) Calculated using the CKD-EPI Creatinine Equation (2021)    Anion gap 13 5 - 15    Comment: Performed at [facility], [address], [place], [place] [address]  Urinalysis, Routine w reflex microscopic -Urine, Clean Catch     Status: Abnormal   Collection Time: 01/01/24 11:11 AM  Result Value Ref Range   Color, Urine YELLOW YELLOW   APPearance CLEAR CLEAR   Specific Gravity, Urine >1.046 (H) 1.005 - 1.030   pH 5.0 5.0 - 8.0   Glucose, UA NEGATIVE NEGATIVE mg/dL   Hgb urine dipstick NEGATIVE NEGATIVE   Bilirubin Urine NEGATIVE NEGATIVE   Ketones, ur NEGATIVE NEGATIVE mg/dL   Protein, ur NEGATIVE NEGATIVE mg/dL   Nitrite NEGATIVE NEGATIVE   Leukocytes,Ua NEGATIVE NEGATIVE    Comment: Performed at [facility], [address], [place], [place] [address]  HIV Antibody (routine testing w rflx)     Status: None   Collection Time: 01/01/24 11:33 AM  Result Value Ref Range   HIV Screen 4th Generation wRfx Non Reactive Non Reactive    Comment: Performed at [facility], [address] [place], [place] [address]  CBC     Status: Abnormal   Collection Time: 01/01/24 11:33 AM  Result Value Ref Range   WBC 13.9 (H) 4.0 - 10.5 K/uL   RBC 5.58 (H) 3.87 - 5.11 MIL/uL   Hemoglobin 16.0 (H) 12.0 - 15.0 g/dL   HCT 51.4 (H) 63.9 - 53.9 %   MCV 86.9 80.0 - 100.0 fL   MCH 28.7 26.0 - 34.0 pg   MCHC 33.0 30.0 - 36.0 g/dL   RDW 86.2 88.4 - 84.4 %   Platelets 415 (H) 150 - 400 K/uL   nRBC 0.0 0.0 - 0.2 %    Comment: Performed at [facility], [address] [place], [place] [address]  Creatinine, serum     Status: None   Collection Time: 01/01/24 11:33 AM  Result Value Ref Range   Creatinine, Ser 0.72 0.44 - 1.00 mg/dL   GFR, Estimated >39 >39 mL/min    Comment: (NOTE) Calculated using the CKD-EPI Creatinine Equation (2021) Performed at  [facility], [address], [place], [place] [address]   CBG monitoring, ED     Status: Abnormal   Collection Time: 01/01/24 12:21 PM  Result Value Ref Range   Glucose-Capillary 128 (H) 70 - 99 mg/dL    Comment: Glucose reference range applies only to samples taken after fasting for at least 8 hours.  I-Stat CG4 Lactic Acid     Status: None   Collection Time: 01/01/24 12:41 PM  Result Value Ref Range   Lactic Acid, Venous 1.2 0.5 - 1.9 mmol/L  Glucose, capillary     Status: Abnormal   Collection Time: 01/01/24  5:38 PM  Result Value Ref Range   Glucose-Capillary 100 (H) 70 - 99 mg/dL    Comment: Glucose reference range applies only to samples taken after fasting for at least 8 hours.  Glucose, capillary     Status: Abnormal   Collection Time: 01/01/24  8:42 PM  Result Value Ref Range   Glucose-Capillary 108 (H) 70 - 99  mg/dL    Comment: Glucose reference range applies only to samples taken after fasting for at least 8 hours.   Comment 1 Notify RN    Comment 2 Document in Chart   Glucose, capillary     Status: Abnormal   Collection Time: 01/02/24 12:34 AM  Result Value Ref Range   Glucose-Capillary 112 (H) 70 - 99 mg/dL    Comment: Glucose reference range applies only to samples taken after fasting for at least 8 hours.   Comment 1 Notify RN    Comment 2 Document in Chart   Glucose, capillary     Status: Abnormal   Collection Time: 01/02/24  5:25 AM  Result Value Ref Range   Glucose-Capillary 106 (H) 70 - 99 mg/dL    Comment: Glucose reference range applies only to samples taken after fasting for at least 8 hours.   Comment 1 Notify RN    Comment 2 Document in Chart   Glucose, capillary     Status: Abnormal   Collection Time: 01/02/24  8:42 AM  Result Value Ref Range   Glucose-Capillary 100 (H) 70 - 99 mg/dL    Comment: Glucose reference range applies only to samples taken after fasting for at least 8 hours.  Basic metabolic panel     Status: Abnormal   Collection  Time: 01/02/24  9:10 AM  Result Value Ref Range   Sodium 140 135 - 145 mmol/L   Potassium 3.3 (L) 3.5 - 5.1 mmol/L   Chloride 110 98 - 111 mmol/L   CO2 20 (L) 22 - 32 mmol/L   Glucose, Bld 112 (H) 70 - 99 mg/dL    Comment: Glucose reference range applies only to samples taken after fasting for at least 8 hours.   BUN 9 6 - 20 mg/dL   Creatinine, Ser 9.34 0.44 - 1.00 mg/dL   Calcium  7.6 (L) 8.9 - 10.3 mg/dL   GFR, Estimated >39 >39 mL/min    Comment: (NOTE) Calculated using the CKD-EPI Creatinine Equation (2021)    Anion gap 10 5 - 15    Comment: Performed at [facility], [address] [place], [place] [address]  Magnesium      Status: None   Collection Time: 01/02/24  9:10 AM  Result Value Ref Range   Magnesium  1.8 1.7 - 2.4 mg/dL    Comment: Performed at [facility], [address], [place], [place] [address]  Glucose, capillary     Status: Abnormal   Collection Time: 01/02/24 12:42 PM  Result Value Ref Range   Glucose-Capillary 128 (H) 70 - 99 mg/dL    Comment: Glucose reference range applies only to samples taken after fasting for at least 8 hours.  Glucose, capillary     Status: None   Collection Time: 01/02/24  5:13 PM  Result Value Ref Range   Glucose-Capillary 75 70 - 99 mg/dL    Comment: Glucose reference range applies only to samples taken after fasting for at least 8 hours.    DG Abd Portable 1V-Small Bowel Obstruction Protocol-initial, 8 hr delay Result Date: 01/02/2024 EXAM: 1 VIEW XRAY OF THE ABDOMEN 01/02/2024 02:30:46 AM COMPARISON: X-ray abdomen dated 12/12/23. CLINICAL HISTORY: FINDINGS: BOWEL: Stable mild gaseous distention of small bowel and colon. Po contrast now seen throughout the colon to the level of the rectum. SOFT TISSUES: Enteric tube with tip and side port overlying the expected region of the gastric lumen. Right upper quadrant surgical clips. BONES: Lumbar and cervical spine  surgical hardware. No acute osseous abnormality. IMPRESSION:  1. Stable mild gaseous distention of small bowel and colon, likely due to mild ileus. No evidence of bowel obstruction . Electronically signed by: Norleen Kil MD 01/02/2024 07:25 AM EDT RP Workstation: [id number]   DG Abd Portable 1V-Small Bowel Protocol-Position Verification Result Date: 01/01/2024 EXAM: SUPINE XRAY VIEWS OF THE ABDOMEN 01/01/2024 12:10:44 PM COMPARISON: None available. CLINICAL HISTORY: [id number] Encounter for imaging study to confirm nasogastric (NG) tube placement [id number]. Encounter for NG tube placement. Encounter for imaging study to confirm nasogastric (NG) tube placement [id number]. Encounter for NG tube placement. FINDINGS: BOWEL: Nasogastric tube tip seen in proximal stomach. The bowel gas pattern is nonspecific. No bowel obstruction. PERITONEUM AND SOFT TISSUES: Status post cholecystectomy. No abnormal calcifications. No free air. BONES: No acute osseous abnormality. IMPRESSION: 1. Nasogastric tube tip appropriately positioned in the proximal stomach. Electronically signed by: Lynwood Seip MD 01/01/2024 12:45 PM EDT RP Workstation: [id number]   CT ABDOMEN PELVIS W CONTRAST Result Date: 01/01/2024 EXAM: CT ABDOMEN AND PELVIS WITH CONTRAST 01/01/2024 09:09:07 AM TECHNIQUE: CT of the abdomen and pelvis was performed with the administration of 75 mL of iohexol  (OMNIPAQUE ) 350 MG/ML injection. Multiplanar reformatted images are provided for review. Automated exposure control, iterative reconstruction, and/or weight-based adjustment of the mA/kV was utilized to reduce the radiation dose to as low as reasonably achievable. COMPARISON: 08/00/2025 CLINICAL HISTORY: Abdominal pain, acute, nonlocalized; generalized pain, back pain. 75ml Omni; Patient endorses lower back pain radiating into left leg. Patient states she thinks she has a ruptured disc. Patient has history of back problems. Patient states this pain started 2.5 weeks ago. Patient also reports diarrhea since Saturday. FINDINGS: LOWER CHEST:  No acute abnormality. LIVER: The liver is unremarkable. GALLBLADDER AND BILE DUCTS: Status post cholecystectomy. No biliary ductal dilatation. SPLEEN: No acute abnormality. PANCREAS: No acute abnormality. ADRENAL GLANDS: No acute abnormality. KIDNEYS, URETERS AND BLADDER: Bilateral nonobstructive nephrolithiasis. No stones in the ureters. No hydronephrosis. No perinephric or periureteral stranding. Urinary bladder is unremarkable. GI AND BOWEL: Status post appendectomy. Moderately dilated small bowel loops are noted centrally concerning for distal small bowel obstruction. No colonic dilatation is noted. There is some twisting of mesentery noted suggesting some degree of malrotation. Stomach demonstrates no acute abnormality. PERITONEUM AND RETROPERITONEUM: Mild amount of free fluid is noted in the pelvis potentially related to small bowel abnormality. No free air. VASCULATURE: Aorta is normal in caliber. LYMPH NODES: No lymphadenopathy. REPRODUCTIVE ORGANS: Status post hysterectomy. BONES AND SOFT TISSUES: Surgical changes are noted in the lumbar spine. No acute osseous abnormality. No focal soft tissue abnormality. IMPRESSION: 1. Moderately dilated small bowel loops centrally, concerning for distal small bowel obstruction. 2. 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 3. Mild free fluid in the pelvis, potentially related to small bowel abnormality. Electronically signed by: Lynwood Seip MD 01/01/2024 09:29 AM EDT RP Workstation: [id number]   CT L-SPINE NO CHARGE Result Date: 01/01/2024 CLINICAL DATA:  Low back pain radiating to left leg. Symptoms 2 and half weeks. EXAM: CT LUMBAR SPINE WITHOUT CONTRAST TECHNIQUE: Multidetector CT imaging of the lumbar spine was performed without intravenous contrast administration. Multiplanar CT image reconstructions were also generated. RADIATION DOSE REDUCTION: This exam was performed according to the departmental dose-optimization program which includes  automated exposure control, adjustment of the mA and/or kV according to patient size and/or use of iterative reconstruction technique. COMPARISON:  07/19/2022 FINDINGS: Segmentation: 5 lumbar type vertebrae. Alignment: Normal. Vertebrae: Vertebral body heights are maintained. There is  mild spondylosis of the lumbar spine. Stable posterior fusion hardware at the L4-5 level with bilateral pedicle screws. Stable midline hardware over the posterior elements of the L5-S1 level. Interval placement of left lateral fusion plate at the O7-6 level with 2 associated screws over L2 in 2 suit screws over L3 as well as interbody fusion. Interbody fusion from L3-4 to L5-S1. Small Schmorl's node inferior endplate L1 unchanged. Paraspinal and other soft tissues: Negative. Disc levels: L1-2 level is unremarkable. L2-3 level with left paraspinal fusion hardware as described intact. No obvious disc bulge or significant neural foraminal narrowing. Borderline canal stenosis. L3-4 level demonstrates minimal right-sided neural foraminal narrowing. No significant canal stenosis. No significant disc bulge. L4-5 level demonstrates no significant canal stenosis or neural foraminal narrowing. L5-S1 level demonstrates no significant canal stenosis or neural foraminal narrowing. There are multiple fluid and air-filled dilated small bowel loops measuring up to 3.7 cm in diameter incompletely evaluated on this exam. IMPRESSION: 1. No acute findings involving the lumbosacral spine. 2. Interval placement of left lateral fusion plate at the O7-6 level with associated screws and interbody fusion. 3. Stable posterior fusion hardware at the L4-5 level. 4. Stable midline hardware over the posterior elements of the L5-S1 level. 5. Minimal right-sided neural foraminal narrowing at the L3-4 level. Borderline canal stenosis at the L2-3 level. 6. Dilated small bowel loops incompletely evaluated. Referred to patient's CT abdomen/pelvis done today.  Electronically Signed   By: Toribio Agreste M.D.   On: 01/01/2024 09:27    Review of Systems  Constitutional:  Positive for activity change.  Gastrointestinal:  Positive for diarrhea.  Musculoskeletal:  Positive for back pain and gait problem.  [name]:  Positive for weakness.  All other systems reviewed and are negative.  Blood pressure 117/78, pulse 78, temperature 97.7 F (36.5 C), temperature source Oral, resp. rate 16, height 5' 6.5 (1.689 m), weight 93.5 kg, SpO2 100%. Physical Exam Constitutional:      Appearance: Normal appearance. She is obese.  HENT:     Head: Normocephalic and atraumatic.     Right Ear: Tympanic membrane, ear canal and external ear normal.     Left Ear: Tympanic membrane, ear canal and external ear normal.  [name]:     Mental Status: She is alert.     Comments: Positive straight leg raising on the left side at 15 degrees motor strength is 4 out of 5 in the gastroc and 4 out of 5 in the tibialis anterior on the left side compared to the right side.  Deep tendon reflexes trace in the patellae.  Absent knee Achilles.  Right lower extremity reflexes are normal.  Straight leg raising there is negative to 80 degrees.  Upper extremity strength and reflexes are normal as is the cranial nerve examination.     Assessment/Plan: Low back pain left lumbar radiculopathy with some modest weakness in the distal left lower extremity.  No obvious pathology on the CT scan to explain the weakness.  I have advised and the patient is ordered for an MRI of the lumbar spine.  I will review this and make further discussion and comment about specific treatment options.  [name] 01/02/2024, 6:20 PM

## 2024-01-02 NOTE — Plan of Care (Signed)

## 2024-01-02 NOTE — Progress Notes (Signed)
 Subjective: Patient feeling better today, but still with a lot of back pain.  Having diarrhea and some flatus.  NGT has been clamped since contrast given yesterday and never returned to suction.  No nausea  ROS: See above, otherwise other systems negative  Objective: Vital signs in last 24 hours: Temp:  [97.4 F (36.3 C)-98.4 F (36.9 C)] 97.4 F (36.3 C) (10/29 0527) Pulse Rate:  [88-107] 88 (10/29 0527) Resp:  [16-18] 16 (10/29 0527) BP: (98-120)/(67-88) 110/68 (10/29 0527) SpO2:  [100 %] 100 % (10/29 0527) Weight:  [93.5 kg] 93.5 kg (10/28 1422) Last BM Date : 01/01/24  Intake/Output from previous day: 10/28 0701 - 10/29 0700 In: 1254 [I.V.:227.4; IV Piggyback:1026.6] Out: -  Intake/Output this shift: No intake/output data recorded.  PE: Abd: softer today, less distended, not really tender, NGT in place and clamped  Lab Results:  Recent Labs    01/01/24 0705 01/01/24 1133  WBC 17.4* 13.9*  HGB 17.2* 16.0*  HCT 52.2* 48.5*  PLT 489* 415*   BMET Recent Labs    01/01/24 0705 01/01/24 1133 01/02/24 0910  NA 135  --  140  K 4.3  --  3.3*  CL 106  --  110  CO2 16*  --  20*  GLUCOSE 165*  --  112*  BUN 14  --  9  CREATININE 0.84 0.72 0.65  CALCIUM  9.0  --  7.6*   PT/INR No results for input(s): LABPROT, INR in the last 72 hours. CMP     Component Value Date/Time   NA 140 01/02/2024 0910   NA 139 10/09/2023 1338   K 3.3 (L) 01/02/2024 0910   CL 110 01/02/2024 0910   CO2 20 (L) 01/02/2024 0910   GLUCOSE 112 (H) 01/02/2024 0910   BUN 9 01/02/2024 0910   BUN 14 10/09/2023 1338   CREATININE 0.65 01/02/2024 0910   CREATININE 0.61 01/16/2023 0845   CALCIUM  7.6 (L) 01/02/2024 0910   PROT 7.6 01/01/2024 0705   PROT 7.1 10/09/2023 1338   ALBUMIN 3.9 01/01/2024 0705   ALBUMIN 4.4 10/09/2023 1338   AST 26 01/01/2024 0705   ALT 22 01/01/2024 0705   ALKPHOS 131 (H) 01/01/2024 0705   BILITOT 0.9 01/01/2024 0705   BILITOT 0.4 10/09/2023 1338    GFRNONAA >60 01/02/2024 0910   GFRNONAA 106 02/16/2020 1144   GFRAA 123 02/16/2020 1144   Lipase     Component Value Date/Time   LIPASE 25 08/06/2018 2006       Studies/Results: DG Abd Portable 1V-Small Bowel Obstruction Protocol-initial, 8 hr delay Result Date: 01/02/2024 EXAM: 1 VIEW XRAY OF THE ABDOMEN 01/02/2024 02:30:46 AM COMPARISON: X-ray abdomen dated 12/12/23. CLINICAL HISTORY: FINDINGS: BOWEL: Stable mild gaseous distention of small bowel and colon. Po contrast now seen throughout the colon to the level of the rectum. SOFT TISSUES: Enteric tube with tip and side port overlying the expected region of the gastric lumen. Right upper quadrant surgical clips. BONES: Lumbar and cervical spine surgical hardware. No acute osseous abnormality. IMPRESSION: 1. Stable mild gaseous distention of small bowel and colon, likely due to mild ileus. No evidence of bowel obstruction . Electronically signed by: Norleen Kil MD 01/02/2024 07:25 AM EDT RP Workstation: [id number]   DG Abd Portable 1V-Small Bowel Protocol-Position Verification Result Date: 01/01/2024 EXAM: SUPINE XRAY VIEWS OF THE ABDOMEN 01/01/2024 12:10:44 PM COMPARISON: None available. CLINICAL HISTORY: [id number] Encounter for imaging study to confirm nasogastric (NG) tube placement [id number]. Encounter for  NG tube placement. Encounter for imaging study to confirm nasogastric (NG) tube placement [id number]. Encounter for NG tube placement. FINDINGS: BOWEL: Nasogastric tube tip seen in proximal stomach. The bowel gas pattern is nonspecific. No bowel obstruction. PERITONEUM AND SOFT TISSUES: Status post cholecystectomy. No abnormal calcifications. No free air. BONES: No acute osseous abnormality. IMPRESSION: 1. Nasogastric tube tip appropriately positioned in the proximal stomach. Electronically signed by: Lynwood Seip MD 01/01/2024 12:45 PM EDT RP Workstation: [id number]   CT ABDOMEN PELVIS W CONTRAST Result Date: 01/01/2024 EXAM: CT ABDOMEN AND  PELVIS WITH CONTRAST 01/01/2024 09:09:07 AM TECHNIQUE: CT of the abdomen and pelvis was performed with the administration of 75 mL of iohexol  (OMNIPAQUE ) 350 MG/ML injection. Multiplanar reformatted images are provided for review. Automated exposure control, iterative reconstruction, and/or weight-based adjustment of the mA/kV was utilized to reduce the radiation dose to as low as reasonably achievable. COMPARISON: 08/00/2025 CLINICAL HISTORY: Abdominal pain, acute, nonlocalized; generalized pain, back pain. 75ml Omni; Patient endorses lower back pain radiating into left leg. Patient states she thinks she has a ruptured disc. Patient has history of back problems. Patient states this pain started 2.5 weeks ago. Patient also reports diarrhea since Saturday. FINDINGS: LOWER CHEST: No acute abnormality. LIVER: The liver is unremarkable. GALLBLADDER AND BILE DUCTS: Status post cholecystectomy. No biliary ductal dilatation. SPLEEN: No acute abnormality. PANCREAS: No acute abnormality. ADRENAL GLANDS: No acute abnormality. KIDNEYS, URETERS AND BLADDER: Bilateral nonobstructive nephrolithiasis. No stones in the ureters. No hydronephrosis. No perinephric or periureteral stranding. Urinary bladder is unremarkable. GI AND BOWEL: Status post appendectomy. Moderately dilated small bowel loops are noted centrally concerning for distal small bowel obstruction. No colonic dilatation is noted. There is some twisting of mesentery noted suggesting some degree of malrotation. Stomach demonstrates no acute abnormality. PERITONEUM AND RETROPERITONEUM: Mild amount of free fluid is noted in the pelvis potentially related to small bowel abnormality. No free air. VASCULATURE: Aorta is normal in caliber. LYMPH NODES: No lymphadenopathy. REPRODUCTIVE ORGANS: Status post hysterectomy. BONES AND SOFT TISSUES: Surgical changes are noted in the lumbar spine. No acute osseous abnormality. No focal soft tissue abnormality. IMPRESSION: 1. Moderately  dilated small bowel loops centrally, concerning for distal small bowel obstruction. 2. 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 3. Mild free fluid in the pelvis, potentially related to small bowel abnormality. Electronically signed by: Lynwood Seip MD 01/01/2024 09:29 AM EDT RP Workstation: [id number]   CT L-SPINE NO CHARGE Result Date: 01/01/2024 CLINICAL DATA:  Low back pain radiating to left leg. Symptoms 2 and half weeks. EXAM: CT LUMBAR SPINE WITHOUT CONTRAST TECHNIQUE: Multidetector CT imaging of the lumbar spine was performed without intravenous contrast administration. Multiplanar CT image reconstructions were also generated. RADIATION DOSE REDUCTION: This exam was performed according to the departmental dose-optimization program which includes automated exposure control, adjustment of the mA and/or kV according to patient size and/or use of iterative reconstruction technique. COMPARISON:  07/19/2022 FINDINGS: Segmentation: 5 lumbar type vertebrae. Alignment: Normal. Vertebrae: Vertebral body heights are maintained. There is mild spondylosis of the lumbar spine. Stable posterior fusion hardware at the L4-5 level with bilateral pedicle screws. Stable midline hardware over the posterior elements of the L5-S1 level. Interval placement of left lateral fusion plate at the O7-6 level with 2 associated screws over L2 in 2 suit screws over L3 as well as interbody fusion. Interbody fusion from L3-4 to L5-S1. Small Schmorl's node inferior endplate L1 unchanged. Paraspinal and other soft tissues: Negative. Disc levels: L1-2 level is unremarkable. L2-3  level with left paraspinal fusion hardware as described intact. No obvious disc bulge or significant neural foraminal narrowing. Borderline canal stenosis. L3-4 level demonstrates minimal right-sided neural foraminal narrowing. No significant canal stenosis. No significant disc bulge. L4-5 level demonstrates no significant canal stenosis or  neural foraminal narrowing. L5-S1 level demonstrates no significant canal stenosis or neural foraminal narrowing. There are multiple fluid and air-filled dilated small bowel loops measuring up to 3.7 cm in diameter incompletely evaluated on this exam. IMPRESSION: 1. No acute findings involving the lumbosacral spine. 2. Interval placement of left lateral fusion plate at the O7-6 level with associated screws and interbody fusion. 3. Stable posterior fusion hardware at the L4-5 level. 4. Stable midline hardware over the posterior elements of the L5-S1 level. 5. Minimal right-sided neural foraminal narrowing at the L3-4 level. Borderline canal stenosis at the L2-3 level. 6. Dilated small bowel loops incompletely evaluated. Referred to patient's CT abdomen/pelvis done today. Electronically Signed   By: Toribio Agreste M.D.   On: 01/01/2024 09:27    Anti-infectives: Anti-infectives (From admission, onward)    None        Assessment/Plan SBO with some mesenteric swirling  -feeling better today -8-hr delay film reviewed and contrast has passed into the colon and patient is having bowel function -will DC NGT and allow CLD   FEN - CLD VTE - Lovenox  ID - none currently needed  Back pain - defer to NSGY/primary DM HTN Pseudoseizures Bipolar 1 disorder Anxiety OSA  I reviewed hospitalist notes, last 24 h vitals and pain scores, last 48 h intake and output, last 24 h labs and trends, and last 24 h imaging results.   LOS: 1 day    Burnard FORBES Banter , [facility] 01/02/2024, 10:09 AM Please see Amion for pager number during day hours 7:00am-4:30pm or 7:00am -11:30am on weekends

## 2024-01-03 ENCOUNTER — Encounter (HOSPITAL_COMMUNITY)
Admission: EM | Disposition: A | Discharge status: Home / Self Care | Payer: Self-pay | Source: Home / Self Care | Attending: Internal Medicine

## 2024-01-03 ENCOUNTER — Inpatient Hospital Stay (HOSPITAL_COMMUNITY)

## 2024-01-03 ENCOUNTER — Inpatient Hospital Stay (HOSPITAL_COMMUNITY): Admitting: Certified Registered Nurse Anesthetist

## 2024-01-03 DIAGNOSIS — F419 Anxiety disorder, unspecified: Secondary | ICD-10-CM

## 2024-01-03 DIAGNOSIS — K56609 Unspecified intestinal obstruction, unspecified as to partial versus complete obstruction: Secondary | ICD-10-CM

## 2024-01-03 DIAGNOSIS — Z87891 Personal history of nicotine dependence: Secondary | ICD-10-CM

## 2024-01-03 DIAGNOSIS — I10 Essential (primary) hypertension: Secondary | ICD-10-CM | POA: Diagnosis not present

## 2024-01-03 HISTORY — PX: LAPAROSCOPY: SHX197

## 2024-01-03 HISTORY — PX: LAPAROSCOPIC LYSIS OF ADHESIONS: SHX5905

## 2024-01-03 LAB — TYPE AND SCREEN
ABO/RH(D): O POS
Antibody Screen: NEGATIVE

## 2024-01-03 LAB — CBC
HCT: 37.9 % (ref 36.0–46.0)
Hemoglobin: 12.4 g/dL (ref 12.0–15.0)
MCH: 28.9 pg (ref 26.0–34.0)
MCHC: 32.7 g/dL (ref 30.0–36.0)
MCV: 88.3 fL (ref 80.0–100.0)
Platelets: 270 K/uL (ref 150–400)
RBC: 4.29 MIL/uL (ref 3.87–5.11)
RDW: 13.5 % (ref 11.5–15.5)
WBC: 8.8 K/uL (ref 4.0–10.5)
nRBC: 0 % (ref 0.0–0.2)

## 2024-01-03 LAB — GLUCOSE, CAPILLARY
Glucose-Capillary: 105 mg/dL — ABNORMAL HIGH (ref 70–99)
Glucose-Capillary: 92 mg/dL (ref 70–99)
Glucose-Capillary: 94 mg/dL (ref 70–99)
Glucose-Capillary: 121 mg/dL — ABNORMAL HIGH (ref 70–99)
Glucose-Capillary: 188 mg/dL — ABNORMAL HIGH (ref 70–99)

## 2024-01-03 LAB — BASIC METABOLIC PANEL WITH GFR
Anion gap: 9 (ref 5–15)
BUN: <5 mg/dL — ABNORMAL LOW (ref 6–20)
CO2: 22 mmol/L (ref 22–32)
Calcium: 8.2 mg/dL — ABNORMAL LOW (ref 8.9–10.3)
Chloride: 109 mmol/L (ref 98–111)
Creatinine, Ser: 0.74 mg/dL (ref 0.44–1.00)
GFR, Estimated: >60 mL/min (ref >60)
Glucose, Bld: 108 mg/dL — ABNORMAL HIGH (ref 70–99)
Potassium: 3.9 mmol/L (ref 3.5–5.1)
Sodium: 140 mmol/L (ref 135–145)

## 2024-01-03 SURGERY — LAPAROSCOPY, DIAGNOSTIC
Anesthesia: General | Site: Abdomen

## 2024-01-03 MED ORDER — FENTANYL CITRATE (PF) 100 MCG/2ML IJ SOLN: 25.0000 ug | INTRAMUSCULAR | Status: DC | PRN

## 2024-01-03 MED ORDER — OXYCODONE HCL 5 MG PO TABS: 5.0000 mg | ORAL_TABLET | Freq: Once | ORAL | Status: DC | PRN

## 2024-01-03 MED ORDER — OXYCODONE HCL 5 MG/5ML PO SOLN: 5.0000 mg | Freq: Once | ORAL | Status: DC | PRN

## 2024-01-03 MED ORDER — CHLORHEXIDINE GLUCONATE 0.12 % MT SOLN: 15.0000 mL | Freq: Once | OROMUCOSAL | Status: AC

## 2024-01-03 MED ORDER — LACTATED RINGERS IV SOLN: INTRAVENOUS | Status: DC

## 2024-01-03 MED ORDER — FENTANYL CITRATE (PF) 250 MCG/5ML IJ SOLN
INTRAMUSCULAR | Status: DC | PRN
  Administered 2024-01-03: 50 ug via INTRAVENOUS
  Administered 2024-01-03: 150 ug via INTRAVENOUS
  Administered 2024-01-03: 50 ug via INTRAVENOUS

## 2024-01-03 MED ORDER — ROCURONIUM BROMIDE 10 MG/ML (PF) SYRINGE
PREFILLED_SYRINGE | INTRAVENOUS | Status: DC | PRN
  Administered 2024-01-03: 100 mg via INTRAVENOUS

## 2024-01-03 MED ORDER — LACTATED RINGERS IV SOLN: INTRAVENOUS | Status: DC | PRN

## 2024-01-03 MED ORDER — LIDOCAINE 2% (20 MG/ML) 5 ML SYRINGE
INTRAMUSCULAR | Status: DC | PRN
  Administered 2024-01-03: 100 mg via INTRAVENOUS

## 2024-01-03 MED ORDER — ORAL CARE MOUTH RINSE: 15.0000 mL | Freq: Once | OROMUCOSAL | Status: AC

## 2024-01-03 MED ORDER — SUCCINYLCHOLINE CHLORIDE 200 MG/10ML IV SOSY
PREFILLED_SYRINGE | INTRAVENOUS | Status: DC | PRN
  Administered 2024-01-03: 120 mg via INTRAVENOUS

## 2024-01-03 MED ORDER — OXYCODONE HCL 5 MG PO TABS
5.0000 mg | ORAL_TABLET | ORAL | Status: DC | PRN
  Administered 2024-01-03 – 2024-01-04 (×2): 10 mg via ORAL
  Filled 2024-01-03 (×2): qty 2

## 2024-01-03 MED ORDER — ONDANSETRON HCL 4 MG/2ML IJ SOLN
INTRAMUSCULAR | Status: DC | PRN
  Administered 2024-01-03: 4 mg via INTRAVENOUS

## 2024-01-03 MED ORDER — ONDANSETRON HCL 4 MG/2ML IJ SOLN: 4.0000 mg | Freq: Once | INTRAMUSCULAR | Status: DC | PRN

## 2024-01-03 MED ORDER — MAGNESIUM SULFATE 2 GM/50ML IV SOLN
2.0000 g | Freq: Once | INTRAVENOUS | Status: AC
  Administered 2024-01-03: 2 g via INTRAVENOUS
  Filled 2024-01-03: qty 50

## 2024-01-03 MED ORDER — DEXAMETHASONE SOD PHOSPHATE PF 10 MG/ML IJ SOLN
INTRAMUSCULAR | Status: DC | PRN
  Administered 2024-01-03: 5 mg via INTRAVENOUS

## 2024-01-03 MED ORDER — ALBUMIN HUMAN 5 % IV SOLN: INTRAVENOUS | Status: DC | PRN

## 2024-01-03 MED ORDER — CEFAZOLIN SODIUM-DEXTROSE 2-4 GM/100ML-% IV SOLN
2.0000 g | INTRAVENOUS | Status: AC
  Administered 2024-01-03: 2 g via INTRAVENOUS
  Filled 2024-01-03: qty 100

## 2024-01-03 MED ORDER — ACETAMINOPHEN 10 MG/ML IV SOLN: 1000.0000 mg | Freq: Once | INTRAVENOUS | Status: DC | PRN

## 2024-01-03 MED ORDER — BUPIVACAINE-EPINEPHRINE (PF) 0.25% -1:200000 IJ SOLN
INTRAMUSCULAR | Status: DC | PRN
  Administered 2024-01-03: 30 mL

## 2024-01-03 MED ORDER — PROPOFOL 10 MG/ML IV BOLUS
INTRAVENOUS | Status: DC | PRN
  Administered 2024-01-03: 150 mg via INTRAVENOUS

## 2024-01-03 MED ORDER — MORPHINE SULFATE (PF) 2 MG/ML IV SOLN
1.0000 mg | INTRAVENOUS | Status: DC | PRN
  Administered 2024-01-04: 2 mg via INTRAVENOUS
  Filled 2024-01-03: qty 1

## 2024-01-03 MED ORDER — FENTANYL CITRATE (PF) 250 MCG/5ML IJ SOLN
INTRAMUSCULAR | Status: AC
  Filled 2024-01-03: qty 5

## 2024-01-03 MED ORDER — 0.9 % SODIUM CHLORIDE (POUR BTL) OPTIME
TOPICAL | Status: DC | PRN
  Administered 2024-01-03: 1000 mL

## 2024-01-03 MED ORDER — GADOBUTROL 1 MMOL/ML IV SOLN
9.4000 mL | Freq: Once | INTRAVENOUS | Status: AC | PRN
  Administered 2024-01-03: 9.4 mL via INTRAVENOUS

## 2024-01-03 MED ORDER — CHLORHEXIDINE GLUCONATE 0.12 % MT SOLN
OROMUCOSAL | Status: AC
  Administered 2024-01-03: 15 mL via OROMUCOSAL
  Filled 2024-01-03: qty 15

## 2024-01-03 MED ORDER — SUGAMMADEX SODIUM 200 MG/2ML IV SOLN
INTRAVENOUS | Status: DC | PRN
  Administered 2024-01-03: 200 mg via INTRAVENOUS

## 2024-01-03 MED ORDER — BUPIVACAINE-EPINEPHRINE (PF) 0.25% -1:200000 IJ SOLN
INTRAMUSCULAR | Status: AC
  Filled 2024-01-03: qty 30

## 2024-01-03 SURGICAL SUPPLY — 26 items
CANISTER SUCTION 3000ML PPV (SUCTIONS) ×2 IMPLANT
CHLORAPREP W/TINT 26 (MISCELLANEOUS) ×2 IMPLANT
COVER SURGICAL LIGHT HANDLE (MISCELLANEOUS) ×2 IMPLANT
DERMABOND ADVANCED .7 DNX12 (GAUZE/BANDAGES/DRESSINGS) ×2 IMPLANT
DRAPE WARM FLUID 44X44 (DRAPES) ×2 IMPLANT
ELECTRODE REM PT RTRN 9FT ADLT (ELECTROSURGICAL) ×2 IMPLANT
GLOVE BIOGEL PI MICRO STRL 6 (GLOVE) ×2 IMPLANT
GLOVE INDICATOR 6.5 STRL GRN (GLOVE) ×2 IMPLANT
GOWN STRL REUS W/ TWL LRG LVL3 (GOWN DISPOSABLE) ×7 IMPLANT
HANDLE SUCTION POOLE (INSTRUMENTS) ×2 IMPLANT
IRRIGATION SUCT STRKRFLW 2 WTP (MISCELLANEOUS) ×1 IMPLANT
KIT BASIN OR (CUSTOM PROCEDURE TRAY) ×2 IMPLANT
KIT TURNOVER KIT B (KITS) ×2 IMPLANT
LIGASURE LAP MARYLAND 5MM 37CM (ELECTROSURGICAL) ×1 IMPLANT
NDL 22X1.5 STRL (OR ONLY) (MISCELLANEOUS) ×1 IMPLANT
NEEDLE 22X1.5 STRL (OR ONLY) (MISCELLANEOUS) ×2 IMPLANT
PAD ARMBOARD POSITIONER FOAM (MISCELLANEOUS) ×4 IMPLANT
SET TUBE SMOKE EVAC HIGH FLOW (TUBING) ×2 IMPLANT
SLEEVE Z-THREAD 5X100MM (TROCAR) ×3 IMPLANT
SOLN 0.9% NACL POUR BTL 1000ML (IV SOLUTION) ×4 IMPLANT
SUT MNCRL AB 4-0 PS2 18 (SUTURE) ×2 IMPLANT
TOWEL GREEN STERILE (TOWEL DISPOSABLE) ×2 IMPLANT
TRAY FOLEY MTR SLVR 14FR STAT (SET/KITS/TRAYS/PACK) ×1 IMPLANT
TRAY LAPAROSCOPIC MC (CUSTOM PROCEDURE TRAY) ×2 IMPLANT
TROCAR Z-THREAD OPTICAL 5X100M (TROCAR) ×2 IMPLANT
WARMER LAPAROSCOPE (MISCELLANEOUS) ×2 IMPLANT

## 2024-01-03 NOTE — Op Note (Addendum)
 Date of Surgery: 01/03/2024 Admit Date: 01/01/2024 Performing Service: General Surgeons and Role:    DEWAINE Ann Fine, MD - Primary  Preoperative Diagnosis: SBO   Postoperative Diagnosis: enteritis  Procedure(s) Performed: Diagnostic laparoscopy, lysis of adhesions  Surgeon: Orie Ann, MD  Anesthesia: General endotracheal.  Specimens: None  EBL:  Minimal.  Operative Findings: Some hyperemic proximal small intestine without any source of obstruction, no mesenteric twisting, no internal hernia no signs of Meckel's. Some RLQ adhesions from prior appendectomy but no bowel dilation or hyperemia in this surrounding intestine. Lysis of majority of these adhesions for further evaluation of the bowel again confirmed no source of obstruction at this point.  Procedure:  After obtaining consent from the patient, the patient was taken to the operating room and laid supine on the operating table.  The patient was then placed under general endotracheal anesthesia.  A Foley catheter was placed in the usual standard fashion.  The anterior abdominal wall was prepped and draped in the usual standard fashion. Antibiotics were administered. A surgical timeout was performed and confirmed our plan.  A LUQ incision was made and abdomen entered using 5mm optiview trocar for direct visualization. No evidence of injury on entrance into abdomen. Abdomen insufflated to . Two additional 5mm ports placed in LLQ under direct visualization. The patient was placed in trendelenberg left side down. Some adhesions seen in RLQ from prior appendectomy but intestine appeared decompressed without any signs of inflammation in this area. Some of these adhesions were taken down for better evaluation to ensure no source of obstruction. Terminal ileum identified and run retrograde to ligament of treitz. There were no adhesions, no masses noted within small intestine or mesentery. No evidence of obvious transition from  decompressed to dilated small intestine. Patient also repositioned to reverse trendelenberg and right side down to facilitate examination of small intestine. Small intestine gradually more hyperemic and slightly dilated proximally but again without any obvious transition, no mass, no adhesions no evidence of internal hernia. Small intestine was run again this time antegrade from ligament of treitz to terminal ileum, again with no new findings.  At this point I felt that this laparoscopy was negative for any mechanical source of obstruction or internal hernia. Findings most consistent with enteritis clinical picture.  The two more superior left  port sites were removed under direct visualization. The abdomen was desufflated and the left lower quadrant port site removed.   Skin of all trocar sites were closed using 4-0 Monocryl in a subcuticular manner and Dermabond.  The Foley catheter was removed.  The patient was reversed from general endotracheal anesthesia, extubated and sent to PACU in stable condition.  Instrument, sponge, and needle counts were correct at closure and at the conclusion of the case.    Orie Ann, MD [facility] Surgery

## 2024-01-03 NOTE — Progress Notes (Addendum)
 Subjective: Saw just prior to going down for MRI.  Has been made NPO due to increase in abdominal pain and nausea.  No BM since yesterday.  No vomiting.  Objective: Vital signs in last 24 hours: Temp:  [97.7 F (36.5 C)-98.9 F (37.2 C)] 98.2 F (36.8 C) (10/30 0726) Pulse Rate:  [76-82] 81 (10/30 0726) Resp:  [16-20] 16 (10/30 0726) BP: (117-133)/(72-82) 133/82 (10/30 0726) SpO2:  [100 %] 100 % (10/30 0726) Last BM Date : 01/02/24  Intake/Output from previous day: No intake/output data recorded. Intake/Output this shift: No intake/output data recorded.  PE: Abd: slightly more bloated today, some central/epigastric tenderness, no peritonitis or rebound  Lab Results:  Recent Labs    01/01/24 0705 01/01/24 1133  WBC 17.4* 13.9*  HGB 17.2* 16.0*  HCT 52.2* 48.5*  PLT 489* 415*   BMET Recent Labs    01/02/24 0910 01/03/24 0334  NA 140 140  K 3.3* 3.9  CL 110 109  CO2 20* 22  GLUCOSE 112* 108*  BUN 9 <5*  CREATININE 0.65 0.74  CALCIUM  7.6* 8.2*   PT/INR No results for input(s): LABPROT, INR in the last 72 hours. CMP     Component Value Date/Time   NA 140 01/03/2024 0334   NA 139 10/09/2023 1338   K 3.9 01/03/2024 0334   CL 109 01/03/2024 0334   CO2 22 01/03/2024 0334   GLUCOSE 108 (H) 01/03/2024 0334   BUN <5 (L) 01/03/2024 0334   BUN 14 10/09/2023 1338   CREATININE 0.74 01/03/2024 0334   CREATININE 0.61 01/16/2023 0845   CALCIUM  8.2 (L) 01/03/2024 0334   PROT 7.6 01/01/2024 0705   PROT 7.1 10/09/2023 1338   ALBUMIN 3.9 01/01/2024 0705   ALBUMIN 4.4 10/09/2023 1338   AST 26 01/01/2024 0705   ALT 22 01/01/2024 0705   ALKPHOS 131 (H) 01/01/2024 0705   BILITOT 0.9 01/01/2024 0705   BILITOT 0.4 10/09/2023 1338   GFRNONAA >60 01/03/2024 0334   GFRNONAA 106 02/16/2020 1144   GFRAA 123 02/16/2020 1144   Lipase     Component Value Date/Time   LIPASE 25 08/06/2018 2006       Studies/Results: DG Abd Portable 1V-Small Bowel  Obstruction Protocol-initial, 8 hr delay Result Date: 01/02/2024 EXAM: 1 VIEW XRAY OF THE ABDOMEN 01/02/2024 02:30:46 AM COMPARISON: X-ray abdomen dated 12/12/23. CLINICAL HISTORY: FINDINGS: BOWEL: Stable mild gaseous distention of small bowel and colon. Po contrast now seen throughout the colon to the level of the rectum. SOFT TISSUES: Enteric tube with tip and side port overlying the expected region of the gastric lumen. Right upper quadrant surgical clips. BONES: Lumbar and cervical spine surgical hardware. No acute osseous abnormality. IMPRESSION: 1. Stable mild gaseous distention of small bowel and colon, likely due to mild ileus. No evidence of bowel obstruction . Electronically signed by: Norleen Kil MD 01/02/2024 07:25 AM EDT RP Workstation: [id number]   DG Abd Portable 1V-Small Bowel Protocol-Position Verification Result Date: 01/01/2024 EXAM: SUPINE XRAY VIEWS OF THE ABDOMEN 01/01/2024 12:10:44 PM COMPARISON: None available. CLINICAL HISTORY: [id number] Encounter for imaging study to confirm nasogastric (NG) tube placement [id number]. Encounter for NG tube placement. Encounter for imaging study to confirm nasogastric (NG) tube placement [id number]. Encounter for NG tube placement. FINDINGS: BOWEL: Nasogastric tube tip seen in proximal stomach. The bowel gas pattern is nonspecific. No bowel obstruction. PERITONEUM AND SOFT TISSUES: Status post cholecystectomy. No abnormal calcifications. No free air. BONES: No acute osseous abnormality. IMPRESSION: 1.  Nasogastric tube tip appropriately positioned in the proximal stomach. Electronically signed by: Lynwood Seip MD 01/01/2024 12:45 PM EDT RP Workstation: [id number]    Anti-infectives: Anti-infectives (From admission, onward)    None        Assessment/Plan SBO with some mesenteric swirling  -having some recurrence of nausea and abdominal pain that started last night.  No BM since yesterday, minimal flatus -8-hr delay film reviewed and contrast has passed  into the colon. -will recheck a plain film today while she is down in radiology for her MRI.  It is possible she could have some recurrence given the swirl initially noted.  Will follow up on film and make further recommendations at that time. -will check lactic acid and cbc -d/w primary service  FEN - NPO VTE - Lovenox  ID - none currently needed  Back pain - defer to NSGY/primary DM HTN Pseudoseizures Bipolar 1 disorder Anxiety OSA  I reviewed Consultant NSGY notes, hospitalist notes, last 24 h vitals and pain scores, last 48 h intake and output, last 24 h labs and trends, and last 24 h imaging results.   LOS: 2 days    Burnard FORBES Banter , [facility] 01/03/2024, 9:50 AM Please see Amion for pager number during day hours 7:00am-4:30pm or 7:00am -11:30am on weekends

## 2024-01-03 NOTE — Progress Notes (Signed)
 PROGRESS NOTE    [name]  FMW:[id number] DOB: 06-19-1965 DOA: 01/01/2024 PCP: Duanne Butler DASEN, MD    Brief Narrative:  71F h/o bipolar disorder type 1, HTN, HLD, DM2, pseudoseizures, and OSA on CPAP p/w SBO.   Assessment and Plan: SBO -GS consulted; apprec eval/recs -Overnight reported more nausea-per general surgery - X-ray reassuring - Ambulate patient   HTN -Resume blood pressure medicines as needed   DM2 -SSI  Back pain -MRI without new issues -NS to see -Scheduled Robaxin  for now   DVT prophylaxis: enoxaparin  (LOVENOX ) injection 40 mg Start: 01/01/24 1100    Code Status: Full Code   Disposition Plan:  Level of care: Med-Surg Status is: Inpatient     Consultants:  General surgery   Subjective: Overnight with worsening nausea No bowel movement  Objective: Vitals:   01/02/24 1940 01/03/24 0501 01/03/24 0726 01/03/24 1014  BP: 120/72 132/77 133/82 (!) 106/51  Pulse: 82 76 81 84  Resp: 16 20 16 16   Temp: 98.9 F (37.2 C) 98.1 F (36.7 C) 98.2 F (36.8 C) 98.8 F (37.1 C)  TempSrc: Oral Oral Oral Oral  SpO2: 100% 100% 100% 100%  Weight:      Height:       No intake or output data in the 24 hours ending 01/03/24 1149  Filed Weights   01/01/24 0639 01/01/24 1422  Weight: 96 kg 93.5 kg    Examination:   General: Appearance:    Obese female in no acute distress     Lungs:     respirations unlabored  Heart:    Normal heart rate.     MS:   All extremities are intact.    Neurologic:   Awake, alert       Data Reviewed: I have personally reviewed following labs and imaging studies  CBC: Recent Labs  Lab 01/01/24 0705 01/01/24 1133  WBC 17.4* 13.9*  NEUTROABS 13.5*  --   HGB 17.2* 16.0*  HCT 52.2* 48.5*  MCV 85.9 86.9  PLT 489* 415*   Basic Metabolic Panel: Recent Labs  Lab 01/01/24 0705 01/01/24 1133 01/02/24 0910 01/03/24 0334  NA 135  --  140 140  K 4.3  --  3.3* 3.9  CL 106  --  110 109  CO2 16*  --  20* 22   GLUCOSE 165*  --  112* 108*  BUN 14  --  9 <5*  CREATININE 0.84 0.72 0.65 0.74  CALCIUM  9.0  --  7.6* 8.2*  MG  --   --  1.8  --    GFR: Estimated Creatinine Clearance: 91.4 mL/min (by C-G formula based on SCr of 0.74 mg/dL). Liver Function Tests: Recent Labs  Lab 01/01/24 0705  AST 26  ALT 22  ALKPHOS 131*  BILITOT 0.9  PROT 7.6  ALBUMIN 3.9   No results for input(s): LIPASE, AMYLASE in the last 168 hours. No results for input(s): AMMONIA in the last 168 hours. Coagulation Profile: No results for input(s): INR, PROTIME in the last 168 hours. Cardiac Enzymes: No results for input(s): CKTOTAL, CKMB, CKMBINDEX, TROPONINI in the last 168 hours. BNP (last 3 results) No results for input(s): PROBNP in the last 8760 hours. HbA1C: No results for input(s): HGBA1C in the last 72 hours. CBG: Recent Labs  Lab 01/02/24 1713 01/02/24 2037 01/03/24 0512 01/03/24 0725 01/03/24 1144  GLUCAP 75 84 105* 92 94   Lipid Profile: No results for input(s): CHOL, HDL, LDLCALC, TRIG, CHOLHDL, LDLDIRECT in the  last 72 hours. Thyroid  Function Tests: No results for input(s): TSH, T4TOTAL, FREET4, T3FREE, THYROIDAB in the last 72 hours. Anemia Panel: No results for input(s): VITAMINB12, FOLATE, FERRITIN, TIBC, IRON, RETICCTPCT in the last 72 hours. Sepsis Labs: Recent Labs  Lab 01/01/24 1241  LATICACIDVEN 1.2    No results found for this or any previous visit (from the past 240 hours).       Radiology Studies: DG Abd 1 View Result Date: 01/03/2024 EXAM: 1 VIEW XRAY OF THE ABDOMEN 01/03/2024 10:14:00 AM COMPARISON: 01/02/2024 CLINICAL HISTORY: Nausea. FINDINGS: BOWEL: Improved gaseous distention of the small bowel. SOFT TISSUES: Cholecystectomy clips noted. No opaque urinary calculi. BONES: L2-3 and L4-5 fusion hardware noted. IMPRESSION: 1. Improved gaseous distention of the small bowel. Electronically signed by: Franky Stanford MD  01/03/2024 11:03 AM EDT RP Workstation: [id number]   MR Lumbar Spine W Tommye Contrast Result Date: 01/03/2024 EXAM: MR Lumbar Spine Without and with intravenous contrast. 01/03/2024 09:36:23 AM TECHNIQUE: Multiplanar multisequence MRI of the lumbar spine was performed without and with the administration of 9.4 mL gadobutrol  (GADAVIST ) 1 MMOL/ML injection. COMPARISON: 06/15/2022 CLINICAL HISTORY: Low back pain, prior surgery, new symptoms. FINDINGS: BONES AND ALIGNMENT: Normal vertebral body heights. Normal bone marrow signal. No abnormal enhancement. Alignment: Transverse interbody fixation at L2-L3. Posterior lumbar interbody fusion at L4-L5. Interspinous fusion at L5-S1. SPINAL CORD: The conus terminates normally. SOFT TISSUES: No acute abnormality. L1-L2: No disc herniation. No spinal canal stenosis or neural foraminal narrowing. L2-L3: Mild chronic disc bulging. No central spinal canal or neural foraminal stenosis. L3-L4: Interbody spacer. No central spinal canal or neural foraminal stenosis. L4-L5: Small disc bulge with endplate spurring. Post fusion changes. No central spinal canal or neural foraminal narrowing. L5-S1: Post fusion changes. Small disc bulge and endplate spurring. No central spinal canal or neural foraminal stenosis. IMPRESSION: 1. No acute findings. 2. Post-surgical changes of the lumbar spine without central canal or neural foraminal stenosis at the operative levels. Electronically signed by: Franky Stanford MD 01/03/2024 11:01 AM EDT RP Workstation: [id number]   DG Abd Portable 1V-Small Bowel Obstruction Protocol-initial, 8 hr delay Result Date: 01/02/2024 EXAM: 1 VIEW XRAY OF THE ABDOMEN 01/02/2024 02:30:46 AM COMPARISON: X-ray abdomen dated 12/12/23. CLINICAL HISTORY: FINDINGS: BOWEL: Stable mild gaseous distention of small bowel and colon. Po contrast now seen throughout the colon to the level of the rectum. SOFT TISSUES: Enteric tube with tip and side port overlying the expected region  of the gastric lumen. Right upper quadrant surgical clips. BONES: Lumbar and cervical spine surgical hardware. No acute osseous abnormality. IMPRESSION: 1. Stable mild gaseous distention of small bowel and colon, likely due to mild ileus. No evidence of bowel obstruction . Electronically signed by: Norleen Kil MD 01/02/2024 07:25 AM EDT RP Workstation: [id number]   DG Abd Portable 1V-Small Bowel Protocol-Position Verification Result Date: 01/01/2024 EXAM: SUPINE XRAY VIEWS OF THE ABDOMEN 01/01/2024 12:10:44 PM COMPARISON: None available. CLINICAL HISTORY: [id number] Encounter for imaging study to confirm nasogastric (NG) tube placement [id number]. Encounter for NG tube placement. Encounter for imaging study to confirm nasogastric (NG) tube placement [id number]. Encounter for NG tube placement. FINDINGS: BOWEL: Nasogastric tube tip seen in proximal stomach. The bowel gas pattern is nonspecific. No bowel obstruction. PERITONEUM AND SOFT TISSUES: Status post cholecystectomy. No abnormal calcifications. No free air. BONES: No acute osseous abnormality. IMPRESSION: 1. Nasogastric tube tip appropriately positioned in the proximal stomach. Electronically signed by: Lynwood Seip MD 01/01/2024 12:45 PM EDT RP Workstation: [id number]  Scheduled Meds:  enoxaparin  (LOVENOX ) injection  40 mg Subcutaneous Q24H   insulin  aspart  0-15 Units Subcutaneous TID WC   insulin  aspart  0-5 Units Subcutaneous QHS   lidocaine   1 patch Transdermal Q24H   methocarbamol   500 mg Oral QID   Continuous Infusions:  sodium chloride  75 mL/hr at 01/02/24 1658     LOS: 2 days    Time spent: 45 minutes spent on chart review, discussion with nursing staff, consultants, updating family and interview/physical exam; more than 50% of that time was spent in counseling and/or coordination of care.    Harlene RAYMOND Bowl, DO [facility] Available via Epic secure chat 7am-7pm After these hours, please refer to coverage provider listed  on [email or web address] 01/03/2024, 11:49 AM

## 2024-01-03 NOTE — Anesthesia Preprocedure Evaluation (Addendum)
 Anesthesia Evaluation  Patient identified by MRN, date of birth, ID band Patient awake    Reviewed: Allergy & Precautions, NPO status , Patient's Chart, lab work & pertinent test results, reviewed documented beta blocker date and time   History of Anesthesia Complications Negative for: history of anesthetic complications  Airway Mallampati: II  TM Distance: >3 FB     Dental no notable dental hx.    Pulmonary shortness of breath, sleep apnea , neg COPD, former smoker   breath sounds clear to auscultation       Cardiovascular hypertension, (-) CAD, (-) Past MI, (-) Cardiac Stents and (-) CABG  Rhythm:Regular Rate:Normal  IMPRESSIONS     1. Left ventricular ejection fraction, by estimation, is 60 to 65%. The  left ventricle has normal function. The left ventricle has no regional  wall motion abnormalities. Left ventricular diastolic parameters were  normal.   2. Right ventricular systolic function is normal. The right ventricular  size is normal.   3. The mitral valve is normal in structure. No evidence of mitral valve  regurgitation. No evidence of mitral stenosis.   4. The aortic valve is tricuspid. Aortic valve regurgitation is not  visualized. No aortic stenosis is present.   5. The inferior vena cava is normal in size with greater than 50%  respiratory variability, suggesting right atrial pressure of 3 mmHg.      Neuro/Psych  Headaches, Seizures -, Well Controlled,  PSYCHIATRIC DISORDERS Anxiety Depression Bipolar Disorder    Neuromuscular disease    GI/Hepatic ,GERD  ,,(+) neg Cirrhosis      SBO   Endo/Other  diabetes, Type 2    Renal/GU Renal disease     Musculoskeletal  (+) Arthritis ,    Abdominal   Peds  Hematology   Anesthesia Other Findings   Reproductive/Obstetrics                              Anesthesia Physical Anesthesia Plan  ASA: 3  Anesthesia Plan: General    Post-op Pain Management:    Induction: Intravenous and Rapid sequence  PONV Risk Score and Plan: 3 and Ondansetron  and Dexamethasone   Airway Management Planned: Oral ETT and Video Laryngoscope Planned  Additional Equipment:   Intra-op Plan:   Post-operative Plan: Extubation in OR  Informed Consent: I have reviewed the patients History and Physical, chart, labs and discussed the procedure including the risks, benefits and alternatives for the proposed anesthesia with the patient or authorized representative who has indicated his/her understanding and acceptance.     Dental advisory given  Plan Discussed with: CRNA  Anesthesia Plan Comments:         Anesthesia Quick Evaluation

## 2024-01-03 NOTE — Transfer of Care (Signed)
 Immediate Anesthesia Transfer of Care Note  Patient: [name]  Procedure(s) Performed: LAPAROSCOPY, DIAGNOSTIC LAPAROTOMY, EXPLORATORY  Patient Location: PACU  Anesthesia Type:General  Level of Consciousness: awake, alert , and oriented  Airway & Oxygen  Therapy: Patient Spontanous Breathing  Post-op Assessment: Report given to RN and Post -op Vital signs reviewed and stable  Post vital signs: Reviewed and stable  Last Vitals:  Vitals Value Taken Time  BP 115/71 01/03/24 15:45  Temp 36.7 C 01/03/24 15:35  Pulse 86 01/03/24 15:48  Resp 15 01/03/24 15:48  SpO2 100 % 01/03/24 15:48  Vitals shown include unfiled device data.  Last Pain:  Vitals:   01/03/24 1329  TempSrc:   PainSc: 8       Patients Stated Pain Goal: 2 (01/03/24 1329)  Complications: No notable events documented.

## 2024-01-03 NOTE — Anesthesia Procedure Notes (Signed)
 Procedure Name: Intubation Date/Time: 01/03/2024 2:12 PM  Performed by: Lockie Flesher, CRNAPre-anesthesia Checklist: Patient identified, Emergency Drugs available, Suction available and Patient being monitored Patient Re-evaluated:Patient Re-evaluated prior to induction Oxygen  Delivery Method: Circle System Utilized Preoxygenation: Pre-oxygenation with 100% oxygen  Induction Type: IV induction Ventilation: Mask ventilation without difficulty Laryngoscope Size: Mac and 3 Grade View: Grade I Tube type: Oral Tube size: 7.0 mm Number of attempts: 1 Airway Equipment and Method: Stylet and Oral airway Placement Confirmation: ETT inserted through vocal cords under direct vision, positive ETCO2 and breath sounds checked- equal and bilateral Secured at: 22 cm Tube secured with: Tape Dental Injury: Teeth and Oropharynx as per pre-operative assessment

## 2024-01-03 NOTE — Plan of Care (Signed)

## 2024-01-04 ENCOUNTER — Encounter (HOSPITAL_COMMUNITY): Payer: Self-pay | Admitting: General Surgery

## 2024-01-04 DIAGNOSIS — K56609 Unspecified intestinal obstruction, unspecified as to partial versus complete obstruction: Secondary | ICD-10-CM | POA: Diagnosis not present

## 2024-01-04 LAB — CBC
HCT: 35.1 % — ABNORMAL LOW (ref 36.0–46.0)
Hemoglobin: 11.7 g/dL — ABNORMAL LOW (ref 12.0–15.0)
MCH: 28.8 pg (ref 26.0–34.0)
MCHC: 33.3 g/dL (ref 30.0–36.0)
MCV: 86.5 fL (ref 80.0–100.0)
Platelets: 280 K/uL (ref 150–400)
RBC: 4.06 MIL/uL (ref 3.87–5.11)
RDW: 13.2 % (ref 11.5–15.5)
WBC: 8.1 K/uL (ref 4.0–10.5)
nRBC: 0 % (ref 0.0–0.2)

## 2024-01-04 LAB — BASIC METABOLIC PANEL WITH GFR
Anion gap: 11 (ref 5–15)
BUN: <5 mg/dL — ABNORMAL LOW (ref 6–20)
CO2: 23 mmol/L (ref 22–32)
Calcium: 8.4 mg/dL — ABNORMAL LOW (ref 8.9–10.3)
Chloride: 106 mmol/L (ref 98–111)
Creatinine, Ser: 0.62 mg/dL (ref 0.44–1.00)
GFR, Estimated: >60 mL/min (ref >60)
Glucose, Bld: 126 mg/dL — ABNORMAL HIGH (ref 70–99)
Potassium: 3.7 mmol/L (ref 3.5–5.1)
Sodium: 140 mmol/L (ref 135–145)

## 2024-01-04 LAB — GLUCOSE, CAPILLARY
Glucose-Capillary: 114 mg/dL — ABNORMAL HIGH (ref 70–99)
Glucose-Capillary: 179 mg/dL — ABNORMAL HIGH (ref 70–99)
Glucose-Capillary: 110 mg/dL — ABNORMAL HIGH (ref 70–99)

## 2024-01-04 MED ORDER — LIDOCAINE 5 % EX PTCH: 1.0000 | MEDICATED_PATCH | CUTANEOUS | Status: AC

## 2024-01-04 MED ORDER — GABAPENTIN 300 MG PO CAPS: ORAL_CAPSULE | ORAL | 0 refills | Status: AC

## 2024-01-04 MED ORDER — GABAPENTIN 300 MG PO CAPS
300.0000 mg | ORAL_CAPSULE | Freq: Three times a day (TID) | ORAL | Status: DC
  Administered 2024-01-04: 300 mg via ORAL
  Filled 2024-01-04: qty 1

## 2024-01-04 NOTE — Progress Notes (Signed)
 AVS completed for discharge packet and placed with chart. Primary nurse notified

## 2024-01-04 NOTE — Discharge Summary (Signed)
 Physician Discharge Summary  [name] FMW:[id number] DOB: November 10, 1965 DOA: 01/01/2024  PCP: Duanne Butler DASEN, MD  Admit date: 01/01/2024 Discharge date: 01/05/2024  Admitted From:  Discharge disposition: home   Recommendations for Outpatient Follow-Up:   Patient sent in this short prescription for pain medicine as she states she was out of it at home Neurontin  restarted at neurosurgery's recommendations   Discharge Diagnosis:   Principal Problem:   SBO (small bowel obstruction) (HCC)    Discharge Condition: Improved.  Diet recommendation: Soft  Wound care: None.  Code status: Full.   History of Present Illness:   [name] is a 58 y.o. female with medical history significant of bipolar disorder type 1, HTN, HLD, DM2, pseudoseizures, and OSA on CPAP p/w SBO.   The patient reported experiencing sciatica pain associated with diarrhea since Saturday. The diarrhea was persistent, with bowel movements described as very loose, and occurred without accompanying nausea or vomiting. The patient initially felt the symptoms might ease but was awakened by them around 4:45 AM. The patient did not report any abdominal pain throughout this period. The patient described the sciatica pain starting in the left hip, radiating down to the leg and foot, and presenting with numbness, tingling, and burning sensations. The pain also radiated to the other leg and extended upwards. The patient has been unable to sleep on their back due to discomfort and reported having sleep apnea. Recently, the patient noticed abdominal distension but reported no issues with eating or drinking.   In the ED, pt AFVSS. Labs notable for WBC 17. CT abd/pelvis showed moderately dilated small bowel loops centrally, concerning for distal small bowel obstruction. EDP consulted EGS and requested medicine admission.   Hospital Course by Problem:   SBO with some mesenteric swirling s/p diagnostic laparoscopy on  01/03/24 - Intra-op findings with no mechanical obstruction, no internal hernia, no strictured bowel, no mesenteric or intestinal mass. Findings more consistent with gastroenteritis. -gener surgery: Supportive care for gastroenteritis. Patient admits today that she had poorly cooked burger about 8-12h before onset of symptoms as well as some potential sick contacts in her family.   HTN -Resume blood pressure medicines as needed outpateint   DM2 - Monitor sugars - Consider holding Mounjaro  until well   Back pain -MRI without new issues -NS consulted: Patient had MRI that I reviewed with her and explained that there is no significant pathoanatomy that explains her left-sided radicular pain. She has had episodic pain in the past and she has used gabapentin  for this. At this point I suggested restarting gabapentin  at 300 mg 3 times a day. She can go home on this medication. No neurosurgical intervention is planned or indicated. She has a follow-up visit with me on November 19.     Medical Consultants:   General Surgery Neurosurgery   Discharge Exam:   Vitals:   01/04/24 0737 01/04/24 1530  BP: 113/74 127/69  Pulse: 89 83  Resp: 16 16  Temp: 98.1 F (36.7 C) 98.2 F (36.8 C)  SpO2: 99% 98%   Vitals:   01/03/24 2000 01/04/24 0600 01/04/24 0737 01/04/24 1530  BP: (!) 142/84 121/75 113/74 127/69  Pulse: (!) 108 79 89 83  Resp: 18 16 16 16   Temp: 98.2 F (36.8 C) 98.1 F (36.7 C) 98.1 F (36.7 C) 98.2 F (36.8 C)  TempSrc: Oral Oral Oral Oral  SpO2: 98% 99% 99% 98%  Weight:      Height:  General exam: Appears calm and comfortable.    The results of significant diagnostics from this hospitalization (including imaging, microbiology, ancillary and laboratory) are listed below for reference.     Procedures and Diagnostic Studies:   DG Abd Portable 1V-Small Bowel Obstruction Protocol-initial, 8 hr delay Result Date: 01/02/2024 EXAM: 1 VIEW XRAY OF THE ABDOMEN  01/02/2024 02:30:46 AM COMPARISON: X-ray abdomen dated 12/12/23. CLINICAL HISTORY: FINDINGS: BOWEL: Stable mild gaseous distention of small bowel and colon. Po contrast now seen throughout the colon to the level of the rectum. SOFT TISSUES: Enteric tube with tip and side port overlying the expected region of the gastric lumen. Right upper quadrant surgical clips. BONES: Lumbar and cervical spine surgical hardware. No acute osseous abnormality. IMPRESSION: 1. Stable mild gaseous distention of small bowel and colon, likely due to mild ileus. No evidence of bowel obstruction . Electronically signed by: Norleen Kil MD 01/02/2024 07:25 AM EDT RP Workstation: [id number]   DG Abd Portable 1V-Small Bowel Protocol-Position Verification Result Date: 01/01/2024 EXAM: SUPINE XRAY VIEWS OF THE ABDOMEN 01/01/2024 12:10:44 PM COMPARISON: None available. CLINICAL HISTORY: [id number] Encounter for imaging study to confirm nasogastric (NG) tube placement [id number]. Encounter for NG tube placement. Encounter for imaging study to confirm nasogastric (NG) tube placement [id number]. Encounter for NG tube placement. FINDINGS: BOWEL: Nasogastric tube tip seen in proximal stomach. The bowel gas pattern is nonspecific. No bowel obstruction. PERITONEUM AND SOFT TISSUES: Status post cholecystectomy. No abnormal calcifications. No free air. BONES: No acute osseous abnormality. IMPRESSION: 1. Nasogastric tube tip appropriately positioned in the proximal stomach. Electronically signed by: Lynwood Seip MD 01/01/2024 12:45 PM EDT RP Workstation: [id number]   CT ABDOMEN PELVIS W CONTRAST Result Date: 01/01/2024 EXAM: CT ABDOMEN AND PELVIS WITH CONTRAST 01/01/2024 09:09:07 AM TECHNIQUE: CT of the abdomen and pelvis was performed with the administration of 75 mL of iohexol  (OMNIPAQUE ) 350 MG/ML injection. Multiplanar reformatted images are provided for review. Automated exposure control, iterative reconstruction, and/or weight-based adjustment of the mA/kV  was utilized to reduce the radiation dose to as low as reasonably achievable. COMPARISON: 08/00/2025 CLINICAL HISTORY: Abdominal pain, acute, nonlocalized; generalized pain, back pain. 75ml Omni; Patient endorses lower back pain radiating into left leg. Patient states she thinks she has a ruptured disc. Patient has history of back problems. Patient states this pain started 2.5 weeks ago. Patient also reports diarrhea since Saturday. FINDINGS: LOWER CHEST: No acute abnormality. LIVER: The liver is unremarkable. GALLBLADDER AND BILE DUCTS: Status post cholecystectomy. No biliary ductal dilatation. SPLEEN: No acute abnormality. PANCREAS: No acute abnormality. ADRENAL GLANDS: No acute abnormality. KIDNEYS, URETERS AND BLADDER: Bilateral nonobstructive nephrolithiasis. No stones in the ureters. No hydronephrosis. No perinephric or periureteral stranding. Urinary bladder is unremarkable. GI AND BOWEL: Status post appendectomy. Moderately dilated small bowel loops are noted centrally concerning for distal small bowel obstruction. No colonic dilatation is noted. There is some twisting of mesentery noted suggesting some degree of malrotation. Stomach demonstrates no acute abnormality. PERITONEUM AND RETROPERITONEUM: Mild amount of free fluid is noted in the pelvis potentially related to small bowel abnormality. No free air. VASCULATURE: Aorta is normal in caliber. LYMPH NODES: No lymphadenopathy. REPRODUCTIVE ORGANS: Status post hysterectomy. BONES AND SOFT TISSUES: Surgical changes are noted in the lumbar spine. No acute osseous abnormality. No focal soft tissue abnormality. IMPRESSION: 1. Moderately dilated small bowel loops centrally, concerning for distal small bowel obstruction. 2. 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 3. Mild free fluid in the pelvis, potentially related to small bowel  abnormality. Electronically signed by: Lynwood Seip MD 01/01/2024 09:29 AM EDT RP Workstation: [id number]    CT L-SPINE NO CHARGE Result Date: 01/01/2024 CLINICAL DATA:  Low back pain radiating to left leg. Symptoms 2 and half weeks. EXAM: CT LUMBAR SPINE WITHOUT CONTRAST TECHNIQUE: Multidetector CT imaging of the lumbar spine was performed without intravenous contrast administration. Multiplanar CT image reconstructions were also generated. RADIATION DOSE REDUCTION: This exam was performed according to the departmental dose-optimization program which includes automated exposure control, adjustment of the mA and/or kV according to patient size and/or use of iterative reconstruction technique. COMPARISON:  07/19/2022 FINDINGS: Segmentation: 5 lumbar type vertebrae. Alignment: Normal. Vertebrae: Vertebral body heights are maintained. There is mild spondylosis of the lumbar spine. Stable posterior fusion hardware at the L4-5 level with bilateral pedicle screws. Stable midline hardware over the posterior elements of the L5-S1 level. Interval placement of left lateral fusion plate at the O7-6 level with 2 associated screws over L2 in 2 suit screws over L3 as well as interbody fusion. Interbody fusion from L3-4 to L5-S1. Small Schmorl's node inferior endplate L1 unchanged. Paraspinal and other soft tissues: Negative. Disc levels: L1-2 level is unremarkable. L2-3 level with left paraspinal fusion hardware as described intact. No obvious disc bulge or significant neural foraminal narrowing. Borderline canal stenosis. L3-4 level demonstrates minimal right-sided neural foraminal narrowing. No significant canal stenosis. No significant disc bulge. L4-5 level demonstrates no significant canal stenosis or neural foraminal narrowing. L5-S1 level demonstrates no significant canal stenosis or neural foraminal narrowing. There are multiple fluid and air-filled dilated small bowel loops measuring up to 3.7 cm in diameter incompletely evaluated on this exam. IMPRESSION: 1. No acute findings involving the lumbosacral spine. 2. Interval  placement of left lateral fusion plate at the O7-6 level with associated screws and interbody fusion. 3. Stable posterior fusion hardware at the L4-5 level. 4. Stable midline hardware over the posterior elements of the L5-S1 level. 5. Minimal right-sided neural foraminal narrowing at the L3-4 level. Borderline canal stenosis at the L2-3 level. 6. Dilated small bowel loops incompletely evaluated. Referred to patient's CT abdomen/pelvis done today. Electronically Signed   By: Toribio Agreste M.D.   On: 01/01/2024 09:27     Labs:   Basic Metabolic Panel: Recent Labs  Lab 01/01/24 0705 01/01/24 1133 01/02/24 0910 01/03/24 0334 01/04/24 0335  NA 135  --  140 140 140  K 4.3  --  3.3* 3.9 3.7  CL 106  --  110 109 106  CO2 16*  --  20* 22 23  GLUCOSE 165*  --  112* 108* 126*  BUN 14  --  9 <5* <5*  CREATININE 0.84 0.72 0.65 0.74 0.62  CALCIUM  9.0  --  7.6* 8.2* 8.4*  MG  --   --  1.8  --   --    GFR Estimated Creatinine Clearance: 91.4 mL/min (by C-G formula based on SCr of 0.62 mg/dL). Liver Function Tests: Recent Labs  Lab 01/01/24 0705  AST 26  ALT 22  ALKPHOS 131*  BILITOT 0.9  PROT 7.6  ALBUMIN 3.9   No results for input(s): LIPASE, AMYLASE in the last 168 hours. No results for input(s): AMMONIA in the last 168 hours. Coagulation profile No results for input(s): INR, PROTIME in the last 168 hours.  CBC: Recent Labs  Lab 01/01/24 0705 01/01/24 1133 01/03/24 0334 01/04/24 0335  WBC 17.4* 13.9* 8.8 8.1  NEUTROABS 13.5*  --   --   --   HGB  17.2* 16.0* 12.4 11.7*  HCT 52.2* 48.5* 37.9 35.1*  MCV 85.9 86.9 88.3 86.5  PLT 489* 415* 270 280   Cardiac Enzymes: No results for input(s): CKTOTAL, CKMB, CKMBINDEX, TROPONINI in the last 168 hours. BNP: Invalid input(s): POCBNP CBG: Recent Labs  Lab 01/03/24 1539 01/03/24 2106 01/04/24 0738 01/04/24 1138 01/04/24 1530  GLUCAP 121* 188* 114* 179* 110*   D-Dimer No results for input(s): DDIMER in  the last 72 hours. Hgb A1c No results for input(s): HGBA1C in the last 72 hours. Lipid Profile No results for input(s): CHOL, HDL, LDLCALC, TRIG, CHOLHDL, LDLDIRECT in the last 72 hours. Thyroid  function studies No results for input(s): TSH, T4TOTAL, T3FREE, THYROIDAB in the last 72 hours.  Invalid input(s): FREET3 Anemia work up No results for input(s): VITAMINB12, FOLATE, FERRITIN, TIBC, IRON, RETICCTPCT in the last 72 hours. Microbiology No results found for this or any previous visit (from the past 240 hours).   Discharge Instructions:   Discharge Instructions     Diet general   Complete by: As directed    Increase activity slowly   Complete by: As directed    Be wary that pain medications can cause constipation      Allergies as of 01/04/2024       Reactions   Penicillins Hives   Toradol  [ketorolac  Tromethamine ] Hives   Dilaudid  [hydromorphone ] Other (See Comments)   Hallucinations   Prednisone  Other (See Comments)   Agitation Irritability   Lamictal [lamotrigine] Rash        Medication List     PAUSE taking these medications    amLODipine  10 MG tablet Wait to take this until your doctor or other care provider tells you to start again. Commonly known as: NORVASC  Take 1 tablet (10 mg total) by mouth daily.   losartan  50 MG tablet Wait to take this until your doctor or other care provider tells you to start again. Commonly known as: COZAAR  Take 1 tablet (50 mg total) by mouth daily.   tirzepatide  2.5 MG/0.5ML Pen Wait to take this until your doctor or other care provider tells you to start again. Commonly known as: MOUNJARO  Inject 2.5 mg into the skin once a week. What changed: when to take this       STOP taking these medications    hydrochlorothiazide  25 MG tablet Commonly known as: HYDRODIURIL    predniSONE  10 MG tablet Commonly known as: DELTASONE        TAKE these medications    Accu-Chek Guide  test strip Generic drug: glucose blood USE AS DIRECTED TO MONITOR FSBS 1X DAILY. DX: R73.09.   b complex vitamins capsule With 500mcg b12 daily What changed:  how much to take how to take this when to take this additional instructions   estradiol  0.1 MG/24HR patch Commonly known as: VIVELLE -DOT Place 1 patch (0.1 mg total) onto the skin 2 (two) times a week.   gabapentin  300 MG capsule Commonly known as: NEURONTIN  Start with 300mg  at bedtime x 2 days then BID x 2 days then TID   Lancets Misc Please dispense based on patient and insurance preference. Use as directed to monitor FSBS 1x daily. Dx: R73.09.   lidocaine  5 % Commonly known as: LIDODERM  Place 1 patch onto the skin daily. Remove & Discard patch within 12 hours or as directed by MD   lubiprostone  24 MCG capsule Commonly known as: AMITIZA  Take 24 mcg by mouth 2 (two) times daily as needed for constipation.   methocarbamol  500 MG  tablet Commonly known as: ROBAXIN  Take 500 mg by mouth at bedtime as needed for muscle spasms.   Oracea  40 MG Cpdr Generic drug: Doxycycline  (Rosacea) Take 40 mg by mouth daily.   rosuvastatin  20 MG tablet Commonly known as: Crestor  Take 1 tablet (20 mg total) by mouth daily.   venlafaxine  XR 150 MG 24 hr capsule Commonly known as: EFFEXOR -XR Take 2 capsules (300 mg total) by mouth daily with breakfast.   Vitamin D  (Ergocalciferol ) 1.25 MG (50000 UNIT) Caps capsule Commonly known as: DRISDOL  Take 1 capsule (50,000 Units total) by mouth every 7 (seven) days. What changed: when to take this   zonisamide  100 MG capsule Commonly known as: ZONEGRAN  Take 200mg  daily at bedtime What changed:  how much to take how to take this when to take this          Time coordinating discharge: 45 min  Signed:  Harlene RAYMOND Bowl DO  [facility] 01/05/2024, 1:51 PM

## 2024-01-04 NOTE — Plan of Care (Signed)
  Problem: Coping: Goal: Ability to adjust to condition or change in health will improve Outcome: Progressing   Problem: Fluid Volume: Goal: Ability to maintain a balanced intake and output will improve Outcome: Progressing   Problem: Health Behavior/Discharge Planning: Goal: Ability to identify and utilize available resources and services will improve Outcome: Progressing   Problem: Nutritional: Goal: Maintenance of adequate nutrition will improve Outcome: Progressing   Problem: Skin Integrity: Goal: Risk for impaired skin integrity will decrease Outcome: Progressing   Problem: Education: Goal: Knowledge of General Education information will improve Description: Including pain rating scale, medication(s)/side effects and non-pharmacologic comfort measures Outcome: Progressing

## 2024-01-04 NOTE — Plan of Care (Signed)

## 2024-01-04 NOTE — Progress Notes (Signed)
 Patient is discharged, education given on medication, and when to take . Belongings with patient. Will go home with family. Stable, no c/o nausea at this time. Clothing and electronics, cell phone returned.

## 2024-01-04 NOTE — Progress Notes (Signed)
 Tentative plan for d/c after seen by Dr. Colon-- tolerating diet.  Would like to trick or treat with grandson JV

## 2024-01-04 NOTE — Progress Notes (Signed)
 Patient ID: [name], female   DOB: 1965/09/10, 58 y.o.   MRN: [id number] Vital signs are stable Patient had exploratory laparotomy yesterday for her small bowel obstruction Patient had MRI that I reviewed with her and explained that there is no significant pathoanatomy that explains her left-sided radicular pain.  She has had episodic pain in the past and she has used gabapentin  for this.  At this point I suggested restarting gabapentin  at 300 mg 3 times a day.  She can go home on this medication.  No neurosurgical intervention is planned or indicated.  She has a follow-up visit with me on November 19.

## 2024-01-04 NOTE — Progress Notes (Addendum)
 1 Day Post-Op  Interval: POD 1. Patient feels less bloated and nausea is improved. She feels hungry  Objective: Vital signs in last 24 hours: Temp:  [98.1 F (36.7 C)-98.8 F (37.1 C)] 98.1 F (36.7 C) (10/31 0737) Pulse Rate:  [79-108] 89 (10/31 0737) Resp:  [13-22] 16 (10/31 0737) BP: (106-142)/(51-84) 113/74 (10/31 0737) SpO2:  [95 %-100 %] 99 % (10/31 0737) Weight:  [93.5 kg] 93.5 kg (10/30 1312) Last BM Date : 01/03/24  Intake/Output from previous day: 10/30 0701 - 10/31 0700 In: 4622.6 [I.V.:4272.6; IV Piggyback:350] Out: 360 [Urine:350; Blood:10] Intake/Output this shift: No intake/output data recorded.  PE: Abd: appropriately tender around surgical incisions. Overall tenderness from yesterday pre-op improved and bloating has improved.  Lab Results:  Recent Labs    01/03/24 0334 01/04/24 0335  WBC 8.8 8.1  HGB 12.4 11.7*  HCT 37.9 35.1*  PLT 270 280   BMET Recent Labs    01/03/24 0334 01/04/24 0335  NA 140 140  K 3.9 3.7  CL 109 106  CO2 22 23  GLUCOSE 108* 126*  BUN <5* <5*  CREATININE 0.74 0.62  CALCIUM  8.2* 8.4*   PT/INR No results for input(s): LABPROT, INR in the last 72 hours. CMP     Component Value Date/Time   NA 140 01/04/2024 0335   NA 139 10/09/2023 1338   K 3.7 01/04/2024 0335   CL 106 01/04/2024 0335   CO2 23 01/04/2024 0335   GLUCOSE 126 (H) 01/04/2024 0335   BUN <5 (L) 01/04/2024 0335   BUN 14 10/09/2023 1338   CREATININE 0.62 01/04/2024 0335   CREATININE 0.61 01/16/2023 0845   CALCIUM  8.4 (L) 01/04/2024 0335   PROT 7.6 01/01/2024 0705   PROT 7.1 10/09/2023 1338   ALBUMIN 3.9 01/01/2024 0705   ALBUMIN 4.4 10/09/2023 1338   AST 26 01/01/2024 0705   ALT 22 01/01/2024 0705   ALKPHOS 131 (H) 01/01/2024 0705   BILITOT 0.9 01/01/2024 0705   BILITOT 0.4 10/09/2023 1338   GFRNONAA >60 01/04/2024 0335   GFRNONAA 106 02/16/2020 1144   GFRAA 123 02/16/2020 1144   Lipase     Component Value Date/Time   LIPASE 25  08/06/2018 2006       Studies/Results: DG Abd 1 View Result Date: 01/03/2024 EXAM: 1 VIEW XRAY OF THE ABDOMEN 01/03/2024 10:14:00 AM COMPARISON: 01/02/2024 CLINICAL HISTORY: Nausea. FINDINGS: BOWEL: Improved gaseous distention of the small bowel. SOFT TISSUES: Cholecystectomy clips noted. No opaque urinary calculi. BONES: L2-3 and L4-5 fusion hardware noted. IMPRESSION: 1. Improved gaseous distention of the small bowel. Electronically signed by: Franky Stanford MD 01/03/2024 11:03 AM EDT RP Workstation: [id number]   MR Lumbar Spine W Tommye Contrast Result Date: 01/03/2024 EXAM: MR Lumbar Spine Without and with intravenous contrast. 01/03/2024 09:36:23 AM TECHNIQUE: Multiplanar multisequence MRI of the lumbar spine was performed without and with the administration of 9.4 mL gadobutrol  (GADAVIST ) 1 MMOL/ML injection. COMPARISON: 06/15/2022 CLINICAL HISTORY: Low back pain, prior surgery, new symptoms. FINDINGS: BONES AND ALIGNMENT: Normal vertebral body heights. Normal bone marrow signal. No abnormal enhancement. Alignment: Transverse interbody fixation at L2-L3. Posterior lumbar interbody fusion at L4-L5. Interspinous fusion at L5-S1. SPINAL CORD: The conus terminates normally. SOFT TISSUES: No acute abnormality. L1-L2: No disc herniation. No spinal canal stenosis or neural foraminal narrowing. L2-L3: Mild chronic disc bulging. No central spinal canal or neural foraminal stenosis. L3-L4: Interbody spacer. No central spinal canal or neural foraminal stenosis. L4-L5: Small disc bulge with endplate spurring. Post  fusion changes. No central spinal canal or neural foraminal narrowing. L5-S1: Post fusion changes. Small disc bulge and endplate spurring. No central spinal canal or neural foraminal stenosis. IMPRESSION: 1. No acute findings. 2. Post-surgical changes of the lumbar spine without central canal or neural foraminal stenosis at the operative levels. Electronically signed by: Franky Stanford MD 01/03/2024 11:01  AM EDT RP Workstation: [id number]    Anti-infectives: Anti-infectives (From admission, onward)    Start     Dose/Rate Route Frequency Ordered Stop   01/03/24 1315  ceFAZolin  (ANCEF ) IVPB 2g/100 mL premix        2 g 200 mL/hr over 30 Minutes Intravenous On call to O.R. 01/03/24 1305 01/03/24 1410        Assessment/Plan SBO with some mesenteric swirling s/p diagnostic laparoscopy on 01/03/24 - Intra-op findings with no mechanical obstruction, no internal hernia, no strictured bowel, no mesenteric or intestinal mass. Findings more consistent with gastroenteritis.  - Supportive care for gastroenteritis. Patient admits today that she had poorly cooked burger about 8-12h before onset of symptoms as well as some potential sick contacts in her family. FEN - Regular diet--ok to let patient self regulate from surgical perspective. IVF per [facility]. VTE - Lovenox  ID - none currently needed  - Okay to shower with surgical glue. If not completed off in 2 weeks okay to gently scrub off with washcloth in shower  Back pain - defer to NSGY/primary DM HTN Pseudoseizures Bipolar 1 disorder Anxiety OSA  General Surgery will sign off at this time  I reviewed Consultant NSGY notes, hospitalist notes, last 24 h vitals and pain scores, last 48 h intake and output, last 24 h labs and trends, and last 24 h imaging results.   LOS: 3 days    Richerd Silversmith , MD [facility] Surgery 01/04/2024, 8:56 AM Please see Amion for pager number during day hours 7:00am-4:30pm or 7:00am -11:30am on weekends

## 2024-01-05 ENCOUNTER — Other Ambulatory Visit: Payer: Self-pay | Admitting: Internal Medicine

## 2024-01-05 MED ORDER — HYDROCODONE-ACETAMINOPHEN 10-325 MG PO TABS: 1.0000 | ORAL_TABLET | ORAL | 0 refills | Status: AC | PRN

## 2024-01-05 NOTE — Progress Notes (Unsigned)
 {  Select_TRH_Note:[id number]}

## 2024-01-07 ENCOUNTER — Telehealth: Payer: Self-pay

## 2024-01-07 NOTE — Transitions of Care (Post Inpatient/ED Visit) (Signed)
 01/07/2024  Name: [name] MRN: [id number] DOB: Jul 23, 1965  Today's TOC FU Call Status: Today's TOC FU Call Status:: Successful TOC FU Call Completed TOC FU Call Complete Date: 01/07/24 Patient's Name and Date of Birth confirmed.  Transition Care Management Follow-up Telephone Call Date of Discharge: 01/04/24 Discharge Facility: [facility]) Type of Discharge: Inpatient Admission Primary Inpatient Discharge Diagnosis:: SBO How have you been since you were released from the hospital?: Better Any questions or concerns?: No  Items Reviewed: Did you receive and understand the discharge instructions provided?: Yes Medications obtained,verified, and reconciled?: Yes (Medications Reviewed) Any new allergies since your discharge?: No Dietary orders reviewed?: Yes Type of Diet Ordered:: regular diet Do you have support at home?: Yes People in Home [RPT]: spouse Name of Support/Comfort Primary Source: [name]  Medications Reviewed Today: Medications Reviewed Today     Reviewed by Rumalda Alan PENNER, RN (Registered Nurse) on 01/07/24 at 1057  Med List Status: <None>   Medication Order Taking? Sig Documenting Provider Last Dose Status Informant  ACCU-CHEK GUIDE test strip [id number] Yes USE AS DIRECTED TO MONITOR FSBS 1X DAILY. DX: R73.09. Duanne Butler DASEN, MD  Active Self, Pharmacy Records  amLODipine  (NORVASC ) 10 MG tablet [id number]  Take 1 tablet (10 mg total) by mouth daily.  Patient not taking: Reported on 01/07/2024   Okey Vina GAILS, MD  Active Pharmacy Records, Self  b complex vitamins capsule [id number] Yes With 500mcg b12 daily Midge Sober, DO  Active Pharmacy Records, Self  estradiol  (VIVELLE -DOT) 0.1 MG/24HR patch [id number] Yes Place 1 patch (0.1 mg total) onto the skin 2 (two) times a week. Signa Delon LABOR, NP  Active Pharmacy Records, Self           Med Note (COFFELL, JON HERO   Tue Jan 01, 2024  2:52 PM) Does not apply on specific days.  gabapentin  (NEURONTIN ) 300 MG  capsule [id number] Yes Start with 300mg  at bedtime x 2 days then BID x 2 days then TID Vann, Jessica U, DO  Active   HYDROcodone -acetaminophen  (NORCO) 10-325 MG tablet [id number]  Take 1 tablet by mouth every 4 (four) hours as needed for severe pain (pain score 7-10).  Patient not taking: Reported on 01/07/2024   Vann, Jessica U, DO  Active   Lancets MISC [id number] Yes Please dispense based on patient and insurance preference. Use as directed to monitor FSBS 1x daily. Dx: R73.09. Duanne Butler DASEN, MD  Active Self, Pharmacy Records  lidocaine  (LIDODERM ) 5 % [id number] Yes Place 1 patch onto the skin daily. Remove & Discard patch within 12 hours or as directed by MD Juvenal Harlene PENNER, DO  Active   losartan  (COZAAR ) 50 MG tablet [id number]  Take 1 tablet (50 mg total) by mouth daily.  Patient not taking: Reported on 01/07/2024   Okey Vina GAILS, MD  Active Pharmacy Records, Self  lubiprostone  (AMITIZA ) 24 MCG capsule [id number] Yes Take 24 mcg by mouth 2 (two) times daily as needed for constipation. [provider]  Active Pharmacy Records, Self  methocarbamol  (ROBAXIN ) 500 MG tablet [id number] Yes Take 500 mg by mouth at bedtime as needed for muscle spasms. [provider]  Active Pharmacy Records, Self  ORACEA  40 MG CPDR [id number] Yes Take 40 mg by mouth daily. Duanne Butler DASEN, MD  Active Pharmacy Records, Self  rosuvastatin  (CRESTOR ) 20 MG tablet [id number] Yes Take 1 tablet (20 mg total) by mouth daily. Okey Vina GAILS, MD  Active Pharmacy Records, Self  Med Note (COFFELL, JON HERO   Tue Jan 01, 2024  3:01 PM) Patient endorses compliance. Per dispense report, LF 05/29/23 #90, 90 DS.  tirzepatide  (MOUNJARO ) 2.5 MG/0.5ML Pen [id number]  Inject 2.5 mg into the skin once a week.  Patient not taking: Reported on 01/07/2024   Midge Sober, DO  Active Pharmacy Records, Self  venlafaxine  XR (EFFEXOR -XR) 150 MG 24 hr capsule [id number] Yes Take 2 capsules (300 mg total) by mouth daily with  breakfast. Duanne Butler DASEN, MD  Active Pharmacy Records, Self           Med Note (COFFELL, JON HERO   Tue Jan 01, 2024  3:01 PM) Patient confirms she is taking this medication every morning - unable to get a straight answer if she is taking 1 or 2 capsules daily.  Vitamin D , Ergocalciferol , (DRISDOL ) 1.25 MG (50000 UNIT) CAPS capsule [id number] Yes Take 1 capsule (50,000 Units total) by mouth every 7 (seven) days. Midge Sober, DO  Active Pharmacy Records, Self  zonisamide  (ZONEGRAN ) 100 MG capsule [id number] Yes Take 200mg  daily at bedtime [name], [name], NP  Active Pharmacy Records, Self            Home Care and Equipment/Supplies: Were Home Health Services Ordered?: No Any new equipment or medical supplies ordered?: No  Functional Questionnaire: Do you need assistance with bathing/showering or dressing?: No Do you need assistance with meal preparation?: No Do you need assistance with eating?: No Do you have difficulty maintaining continence: No Do you need assistance with getting out of bed/getting out of a chair/moving?: No Do you have difficulty managing or taking your medications?: No  Follow up appointments reviewed: PCP Follow-up appointment confirmed?: No (Will call today and make an appointment) MD Provider Line [phone] Given: No Specialist Hospital Follow-up appointment confirmed?: No Follow-Up Specialty Provider:: Weight loss management clinic [facility] Specialist Follow-Up Not Confirmed: Patient has Specialist Provider Number and will Call for Appointment Do you need transportation to your follow-up appointment?: No Do you understand care options if your condition(s) worsen?: Yes-patient verbalized understanding  SDOH Interventions Today    Flowsheet Row Most Recent Value  SDOH Interventions   Food Insecurity Interventions Intervention Not Indicated  Housing Interventions Intervention Not Indicated  Transportation Interventions Intervention Not Indicated   Utilities Interventions Intervention Not Indicated   Patient reports that she is doing well. Reports no abdominal pain. Reports that she is currently having her normal chronic back pain.   Reviewed all discharge instructions with patient. Reviewed importance of staying hydrated.  Encouraged patient to check CBG and BP daily and keep a log. Reviewed importance of follow up with PCP, cardiology and weight loss clinic. Reviewed management of dizziness. Reviewed changing positions slowly.  Discussed side effects of gabapentin  with causing sleepiness.   Reviewed and offered 30 day TOC program and patient declined. Provided my contact information if patient needs to call me back.  Alan Ee, RN, BSN, CEN [employer]- Transition of Care Team.  [facility] [phone]

## 2024-01-08 ENCOUNTER — Ambulatory Visit (INDEPENDENT_AMBULATORY_CARE_PROVIDER_SITE_OTHER): Admitting: Family Medicine

## 2024-01-08 ENCOUNTER — Encounter: Payer: Self-pay | Admitting: Family Medicine

## 2024-01-08 VITALS — BP 130/82 | HR 78 | Temp 98.1°F | Ht 66.5 in | Wt 210.6 lb

## 2024-01-08 DIAGNOSIS — E1165 Type 2 diabetes mellitus with hyperglycemia: Secondary | ICD-10-CM

## 2024-01-08 DIAGNOSIS — Z7985 Long-term (current) use of injectable non-insulin antidiabetic drugs: Secondary | ICD-10-CM | POA: Diagnosis not present

## 2024-01-08 NOTE — Progress Notes (Signed)
 Subjective:    Patient ID: [name], female    DOB: [birth year], 58 y.o.   MRN: [id number]  Patient recently went to the emergency room with low back pain and sciatica-like symptoms.  While in the emergency room, the patient was found to have a white blood cell count of 17.  During the workup for this a CT scan of the abdomen and pelvis revealed dilated small bowel loops consistent with a small bowel obstruction.  She was admitted to the hospital and started on an NG tube.  Eventually she underwent surgery however there was no mechanical small bowel obstruction identified.  Surgery felt that her nausea was most likely due to the gastroenteritis.  While in the hospital, she had an MRI of the back.  I reviewed the findings of this MRI.  There was no significant nerve impingement.  There was only postsurgical changes.  Neurosurgery started the patient on gabapentin  300 mg 3 times a day which seems to be helping the left-sided radicular leg pain.  She has an appointment to see neurosurgery later in November.  She was previously on Mounjaro  for her diabetes.  She is currently holding Mounjaro .  A1c in August was 5.5. Past Medical History:  Diagnosis Date   Anxiety    Anxiety    Back pain    Bipolar 1 disorder (HCC)    Bipolar disorder (HCC)    Chest pain    Constipation    Depression    Depression    Diabetes (HCC)    Diabetes mellitus without complication (HCC)    Type 2   Dyspnea    r/t anxiety   Encounter for neuropsychological testing    at [facility] 3/20-suggest possible somatization   Fatty liver    GERD (gastroesophageal reflux disease)    Headache    High blood pressure    High cholesterol    Hypertension    Joint pain    Migraines    OSA on CPAP    moderate obstructive sleep apnea with an AHI of 21.3/h and no central events.  Her supine AHI was 31.3/hr.  O2 saturations were as low as 86%..  On CPAP at 15cm H2O   Pseudoseizure    Last seizure March/April 2024   PTSD  (post-traumatic stress disorder)    Rheumatoid arthritis (HCC)    Rosacea    Seizures (HCC)    [facility] (Dr. 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 1990s   ANTERIOR LAT LUMBAR FUSION N/A 08/03/2022   Procedure: Lumbar two-three extreme Lateral Interbody Fusion;  Surgeon: Colon Shove, MD;  Location: [facility] OR;  Service: Neurosurgery;  Laterality: N/A;   APPENDECTOMY     BACK SURGERY     x2   CHOLECYSTECTOMY     COLONOSCOPY WITH PROPOFOL  N/A 07/20/2021   Surgeon: Shaaron Lamar HERO, MD;  Three 6-8 mm polyps in the descending colon and cecum removed, diverticulosis in the entire colon, otherwise normal exam.  Pathology with tubular adenomas.  Recommended 5-year surveillance.   CYST EXCISION     on ovaries   LAPAROSCOPIC LYSIS OF ADHESIONS N/A 01/03/2024   Procedure: LYSIS, ADHESIONS, LAPAROSCOPIC;  Surgeon: Ann Fine, MD;  Location: [facility] OR;  Service: General;  Laterality: N/A;   LAPAROSCOPY N/A 01/03/2024   Procedure: LAPAROSCOPY, DIAGNOSTIC;  Surgeon: Ann Fine, MD;  Location: [facility] OR;  Service: General;  Laterality: N/A;   lumbar     ruptured disc in lumbar taken out   POLYPECTOMY  07/20/2021   Procedure: POLYPECTOMY;  Surgeon: Shaaron Lamar HERO, MD;  Location: [facility] ENDO SUITE;  Service: Endoscopy;;   ROTATOR CUFF REPAIR Left    TONSILLECTOMY     TUBAL LIGATION     Current Outpatient Medications on File Prior to Visit  Medication Sig Dispense Refill   ACCU-CHEK GUIDE test strip USE AS DIRECTED TO MONITOR FSBS 1X DAILY. DX: R73.09. 100 strip 11   b complex vitamins capsule With 500mcg b12 daily     estradiol  (VIVELLE -DOT) 0.1 MG/24HR patch Place 1 patch (0.1 mg total) onto the skin 2 (two) times a week. 8 patch 6   gabapentin   (NEURONTIN ) 300 MG capsule Start with 300mg  at bedtime x 2 days then BID x 2 days then TID 90 capsule 0   Lancets MISC Please dispense based on patient and insurance preference. Use as directed to monitor FSBS 1x daily. Dx: R73.09. 50 each 0   lidocaine  (LIDODERM ) 5 % Place 1 patch onto the skin daily. Remove & Discard patch within 12 hours or as directed by MD     lubiprostone  (AMITIZA ) 24 MCG capsule Take 24 mcg by mouth 2 (two) times daily as needed for constipation.     methocarbamol  (ROBAXIN ) 500 MG tablet Take 500 mg by mouth at bedtime as needed for muscle spasms.     ORACEA  40 MG CPDR Take 40 mg by mouth daily. 30 capsule 0   rosuvastatin  (CRESTOR ) 20 MG tablet Take 1 tablet (20 mg total) by mouth daily. 90 tablet 3   venlafaxine  XR (EFFEXOR -XR) 150 MG 24 hr capsule Take 2 capsules (300 mg total) by mouth daily with breakfast. 180 capsule 0   Vitamin D , Ergocalciferol , (DRISDOL ) 1.25 MG (50000 UNIT) CAPS capsule Take 1 capsule (50,000 Units total) by mouth every 7 (seven) days. 4 capsule 0   zonisamide  (ZONEGRAN ) 100 MG capsule Take 200mg  daily at bedtime 180 capsule 3   [Paused] amLODipine  (NORVASC ) 10 MG tablet Take 1 tablet (10 mg total) by mouth daily. (Patient not taking: Reported on 01/07/2024) 90 tablet 3   HYDROcodone -acetaminophen  (NORCO) 10-325 MG tablet Take 1 tablet by mouth every 4 (four) hours as needed for severe pain (pain score 7-10). (Patient not taking: Reported on 01/08/2024) 20 tablet 0   [Paused] losartan  (COZAAR ) 50 MG tablet Take 1 tablet (50 mg total) by mouth daily. (Patient not taking: Reported on 01/07/2024) 90 tablet 3   [Paused] tirzepatide  (MOUNJARO ) 2.5 MG/0.5ML Pen Inject 2.5 mg into the skin once a week. (Patient not taking: Reported on 01/07/2024) 2 mL 0   No current facility-administered medications on file prior to visit.   Allergies  Allergen Reactions   Penicillins Hives   Toradol  [Ketorolac  Tromethamine ] Hives   Dilaudid  [Hydromorphone ] Other (See  Comments)    Hallucinations   Prednisone  Other (See Comments)    Agitation Irritability   Lamictal [Lamotrigine] Rash   Latex Rash   Social History   Socioeconomic History   Marital status: Married    Spouse name: Not on file   Number of children: 2   Years of education: Associates    Highest education level: Not on file  Occupational History   Not on file  Tobacco Use   Smoking status: Former  Current packs/day: 0.00    Average packs/day: 0.5 packs/day for 10.0 years (5.0 ttl pk-yrs)    Types: Cigarettes    Start date: 05/14/1981    Quit date: 05/15/1991    Years since quitting: 32.6   Smokeless tobacco: Never  Vaping Use   Vaping status: Never Used  Substance and Sexual Activity   Alcohol use: Yes    Comment: socially   Drug use: No   Sexual activity: Yes    Birth control/protection: Surgical    Comment: hyst  Other Topics Concern   Not on file  Social History Narrative   Caffeine  use: tea sometimes, soda sometimes   Right handed   Married x 5 years 01/27/21   1 son and 1 daughter. 5 grandchildren.    Social Drivers of Corporate Investment Banker Strain: Low Risk  (08/08/2023)   Overall Financial Resource Strain (CARDIA)    Difficulty of Paying Living Expenses: Not hard at all  Food Insecurity: No Food Insecurity (01/07/2024)   Hunger Vital Sign    Worried About Running Out of Food in the Last Year: Never true    Ran Out of Food in the Last Year: Never true  Transportation Needs: Unknown (01/07/2024)   PRAPARE - Administrator, Civil Service (Medical): No    Lack of Transportation (Non-Medical): Not on file  Physical Activity: Inactive (08/08/2023)   Exercise Vital Sign    Days of Exercise per Week: 0 days    Minutes of Exercise per Session: 0 min  Stress: Stress Concern Present (08/08/2023)   [employer] of Occupational Health - Occupational Stress Questionnaire    Feeling of Stress : To some extent  Social Connections: Moderately Integrated  (08/08/2023)   Social Connection and Isolation Panel    Frequency of Communication with Friends and Family: More than three times a week    Frequency of Social Gatherings with Friends and Family: Once a week    Attends Religious Services: More than 4 times per year    Active Member of [employer] or Organizations: No    Attends Banker Meetings: Never    Marital Status: Married  Catering Manager Violence: Not At Risk (01/07/2024)   Humiliation, Afraid, Rape, and Kick questionnaire    Fear of Current or Ex-Partner: No    Emotionally Abused: No    Physically Abused: No    Sexually Abused: No      Review of Systems  All other systems reviewed and are negative.      Objective:    Physical Exam Vitals reviewed.  Constitutional:      General: She is not in acute distress.    Appearance: Normal appearance. She is normal weight. She is not ill-appearing, toxic-appearing or diaphoretic.  HENT:     Head: Normocephalic.  Cardiovascular:     Rate and Rhythm: Normal rate and regular rhythm.     Heart sounds: No murmur heard.   Pulmonary:     Effort: Pulmonary effort is normal. No respiratory distress.     Breath sounds: Normal breath sounds.   Neurological:     General: No focal deficit present.     Mental Status: She is alert and oriented to person, place, and time. Mental status is at baseline.     Cranial Nerves: No cranial nerve deficit.     Motor: No weakness.     Coordination: Coordination normal.     Gait: Gait normal.  Psychiatric:  Mood and Affect: Mood normal.        Behavior: Behavior normal.        Thought Content: Thought content normal.        Judgment: Judgment normal.     Assessment & Plan:  Type 2 diabetes mellitus with hyperglycemia, without long-term current use of insulin  (HCC) - Plan: Hemoglobin A1c, CBC with Differential/Platelet, Comprehensive metabolic panel with GFR   Repeat CBC to ensure resolution of the leukocytosis.  Suspect findings  on CT scan may be related to viral gastroenteritis versus a side effect of Mounjaro .  Recommended that she discontinue Mounjaro  for at least 2 to 3 weeks to allow her stomach to heal from surgery and then resume low-dose Mounjaro  at that point if she is feeling better.  Patient is currently doing better with regards to her left-sided radicular leg pain on gabapentin .  She is only taking gabapentin  300 mg at night at the present time however this seems to be helping her.

## 2024-01-09 LAB — COMPREHENSIVE METABOLIC PANEL WITH GFR
AG Ratio: 1.7 (calc) (ref 1.0–2.5)
ALT: 32 U/L — ABNORMAL HIGH (ref 6–29)
AST: 22 U/L (ref 10–35)
Albumin: 4 g/dL (ref 3.6–5.1)
Alkaline phosphatase (APISO): 116 U/L (ref 37–153)
BUN: 9 mg/dL (ref 7–25)
CO2: 24 mmol/L (ref 20–32)
Calcium: 9 mg/dL (ref 8.6–10.4)
Chloride: 106 mmol/L (ref 98–110)
Creat: 0.65 mg/dL (ref 0.50–1.03)
Globulin: 2.3 g/dL (ref 1.9–3.7)
Glucose, Bld: 108 mg/dL — ABNORMAL HIGH (ref 65–99)
Potassium: 3.9 mmol/L (ref 3.5–5.3)
Sodium: 143 mmol/L (ref 135–146)
Total Bilirubin: 0.3 mg/dL (ref 0.2–1.2)
Total Protein: 6.3 g/dL (ref 6.1–8.1)
eGFR: 103 mL/min/1.73m2 (ref >=60)

## 2024-01-09 LAB — CBC WITH DIFFERENTIAL/PLATELET
Absolute Lymphocytes: 1929 {cells}/uL (ref 850–3900)
Absolute Monocytes: 519 {cells}/uL (ref 200–950)
Basophils Absolute: 36 {cells}/uL (ref 0–200)
Basophils Relative: 0.4 %
Eosinophils Absolute: 355 {cells}/uL (ref 15–500)
Eosinophils Relative: 3.9 %
HCT: 37.5 % (ref 35.0–45.0)
Hemoglobin: 12.6 g/dL (ref 11.7–15.5)
MCH: 29.2 pg (ref 27.0–33.0)
MCHC: 33.6 g/dL (ref 32.0–36.0)
MCV: 87 fL (ref 80.0–100.0)
MPV: 10.8 fL (ref 7.5–12.5)
Monocytes Relative: 5.7 %
Neutro Abs: 6261 {cells}/uL (ref 1500–7800)
Neutrophils Relative %: 68.8 %
Platelets: 330 Thousand/uL (ref 140–400)
RBC: 4.31 Million/uL (ref 3.80–5.10)
RDW: 12.9 % (ref 11.0–15.0)
Total Lymphocyte: 21.2 %
WBC: 9.1 Thousand/uL (ref 3.8–10.8)

## 2024-01-09 LAB — HEMOGLOBIN A1C
Hgb A1c MFr Bld: 5.6 % (ref <5.7)
Mean Plasma Glucose: 114 mg/dL
eAG (mmol/L): 6.3 mmol/L

## 2024-01-10 ENCOUNTER — Ambulatory Visit (INDEPENDENT_AMBULATORY_CARE_PROVIDER_SITE_OTHER): Admitting: Family Medicine

## 2024-01-10 ENCOUNTER — Ambulatory Visit: Payer: Self-pay | Admitting: Family Medicine

## 2024-01-10 ENCOUNTER — Encounter (INDEPENDENT_AMBULATORY_CARE_PROVIDER_SITE_OTHER): Payer: Self-pay | Admitting: Family Medicine

## 2024-01-10 VITALS — BP 132/73 | HR 66 | Temp 98.4°F | Ht 66.0 in | Wt 207.0 lb

## 2024-01-10 DIAGNOSIS — K529 Noninfective gastroenteritis and colitis, unspecified: Secondary | ICD-10-CM

## 2024-01-10 DIAGNOSIS — E669 Obesity, unspecified: Secondary | ICD-10-CM

## 2024-01-10 DIAGNOSIS — R197 Diarrhea, unspecified: Secondary | ICD-10-CM | POA: Diagnosis not present

## 2024-01-10 DIAGNOSIS — K5900 Constipation, unspecified: Secondary | ICD-10-CM | POA: Diagnosis not present

## 2024-01-10 DIAGNOSIS — E559 Vitamin D deficiency, unspecified: Secondary | ICD-10-CM

## 2024-01-10 DIAGNOSIS — E1165 Type 2 diabetes mellitus with hyperglycemia: Secondary | ICD-10-CM

## 2024-01-10 DIAGNOSIS — Z6833 Body mass index (BMI) 33.0-33.9, adult: Secondary | ICD-10-CM

## 2024-01-10 MED ORDER — VITAMIN D (ERGOCALCIFEROL) 1.25 MG (50000 UNIT) PO CAPS: 50000.0000 [IU] | ORAL_CAPSULE | ORAL | 1 refills | Status: DC

## 2024-01-10 NOTE — Anesthesia Postprocedure Evaluation (Signed)
 Anesthesia Post Note  Patient: Camreigh Michie J[place]lin  Procedure(s) Performed: LAPAROSCOPY, DIAGNOSTIC (Abdomen) LYSIS, ADHESIONS, LAPAROSCOPIC (Abdomen)     Patient location during evaluation: PACU Anesthesia Type: General Level of consciousness: awake and alert Pain management: pain level controlled Vital Signs Assessment: post-procedure vital signs reviewed and stable Respiratory status: spontaneous breathing, nonlabored ventilation and respiratory function stable Cardiovascular status: blood pressure returned to baseline and stable Postop Assessment: no apparent nausea or vomiting Anesthetic complications: no   No notable events documented.               Marquesa Rath

## 2024-01-10 NOTE — Progress Notes (Signed)
 [name], D.O.  ABFM, ABOM Specializing in Clinical Bariatric Medicine  Office located at: [address] [place], [place]  [address]      A) FOR THE CHRONIC DISEASE OF OBESITY:  Chief complaint: Obesity [name] is here to discuss her progress with her obesity treatment plan.   History of present illness / Interval history:  [name] is here today for her follow-up office visit.  Since last OV on 12/05/23, pt is down 3 lbs. Patient was hospitalized recently and is recovering from that.     12/05/23 11:00 01/10/24 12:00   Body Fat % 45.4 % 46.1 %  Muscle Mass (lbs) 109 lbs 106.4 lbs  Fat Mass (lbs) 95.6 lbs 95.8 lbs  Total Body Water (lbs) 77.8 lbs 76 lbs  Visceral Fat Rating  12 12   Counseling done on how various foods will affect these numbers and how to maximize success   Total lbs lost to date: - 10 lbs Total Fat Mass in lbs lost to date: - 5.2 lbs Total weight loss percentage to date: - 4.61 %    Obesity, Starting BMI 35.04 BMI 33.0-33.9,adult - current BMI 33.43 Nutrition Therapy She is Journaling 1300-1400 cal and 85+ grams protein with the Cat 2 meal plan as a guide and states she is following her eating plan approximately 80 % of the time.   - Tracking Calories/Macros: no   - Eating More Whole Foods: yes  - Adequate Protein Intake: yes  - Adequate Water Intake: no   - Skipping Meals: no   - Sleeping 7-9 Hours/ Night: yes   [name] is currently in the action stage of change. As such, her goal is to continue weight management plan.  She has agreed to: Journal 1300 with 85+ g protein   Physical Activity [name] is walking 20  minutes 3 days per week   [name] has been advised to work up to 300-450 minutes of moderate intensity aerobic activity a week and strengthening exercises 2-3 times per week for cardiovascular health, weight loss maintenance and preservation of muscle mass.  She has agreed to : Think about enjoyable ways to increase daily  physical activity and overcoming barriers to exercise   Behavioral Modifications Evidence-based interventions for health behavior change were utilized today including the discussion of   1) SMART goals for next OV:    - Begin journaling   Regarding patient's less desirable eating habits and patterns, we employed the technique of small changes.   We discussed the following today: increasing lean protein intake to established goals, increasing water intake , planning for success, and better snacking choices Additional resources provided today: Handout on Daily Food Journaling Log   Medical Interventions/ Pharmacotherapy Previous Bariatric surgery: n/a Pharmacotherapy for weight loss: She is not currently taking medications  for medical weight loss.    -Pt was previously on Mounjaro  2.5 mg once weekly but was discontinued due to hospitalization (bowel obstruction)  We discussed various medication options to help Enora with her weight loss efforts and we both agreed to : Continue with current nutritional and behavioral strategies   B) OBESITY RELATED CONDITIONS ADDRESSED TODAY:  Type 2 diabetes mellitus with hyperglycemia, without long-term current use of insulin  [facility]) Assessment & Plan Lab Results  Component Value Date   HGBA1C 5.6 01/08/2024   HGBA1C 5.5 10/08/2023   HGBA1C 6.7 (H) 05/14/2023   INSULIN  17.8 05/14/2023    Patient was previously on Mounjaro  but discontinued the use  after hospitalization. A1C has been well controlled. Patient will not resume medication and will begin controlling her DM with diet and lifestyle changes. Patient states that her bowel movement has not returned to normal just yet and is still working on normalizing it. States that her hunger and cravings at this moment aren't overwhelming and she will continue monitoring her cravings and seeing if there is an increase. Continue following prudent meal plan and decreasing simple carbs.     Vitamin D   deficiency Assessment & Plan Lab Results  Component Value Date   VD25OH 40.3 10/08/2023   VD25OH 13.0 (L) 05/14/2023   Taking Vit D ERGO 50K units weekly. Good compliance and tolerance per patient. Continue with supplementation. No acute concerns today. Will obtain labs in 3-4 months.     Gastroenteritis vs SBO Assessment & Plan Patient was recently hospitalized with bowel obstruction signs. Notes from ED were reviewed. Laparoscopy was performed on 01/03/24 and showed no mechanical obstruction, no internal hernia, no strictured bowel, no mesenteric or intestinal mass. Patient states there they explained to her that this could be due to a side effect from Mounjaro . She endorses that her bowel movements are not back to normal and she can feel that they are still slow. She states that she is having some loose stools. Recommended patient to continue drinking water and to not take Mounjaro . Will reassess at next OV how her bowel movements are doing.     Medications Discontinued During This Encounter  Medication Reason   Vitamin D , Ergocalciferol , (DRISDOL ) 1.25 MG (50000 UNIT) CAPS capsule Reorder     Meds ordered this encounter  Medications   Vitamin D , Ergocalciferol , (DRISDOL ) 1.25 MG (50000 UNIT) CAPS capsule    Sig: Take 1 capsule (50,000 Units total) by mouth every 7 (seven) days.    Dispense:  4 capsule    Refill:  1      Follow up:   Return 02/04/2024 at 12:20 PM  She was informed of the importance of frequent follow up visits to maximize her success with intensive lifestyle modifications for her multiple health conditions.   Weight Summary and Biometrics   Weight Lost Since Last Visit: 3lb  Weight Gained Since Last Visit: 0lb    Vitals Temp: 98.4 F (36.9 C) BP: 132/73 Pulse Rate: 66 SpO2: 98 %   Anthropometric Measurements Height: 5' 6 (1.676 m) Weight: 207 lb (93.9 kg) BMI (Calculated): 33.43 Weight at Last Visit: 210lb Weight Lost Since Last Visit:  3lb Weight Gained Since Last Visit: 0lb Starting Weight: 217lb Total Weight Loss (lbs): 10 lb (4.536 kg)   Body Composition  Body Fat %: 46.1 % Fat Mass (lbs): 95.8 lbs Muscle Mass (lbs): 106.4 lbs Total Body Water (lbs): 76 lbs Visceral Fat Rating : 12   Other Clinical Data Fasting: no Labs: no Today's Visit #: 10 Starting Date: 05/14/23    Objective:   PHYSICAL EXAM: Blood pressure 132/73, pulse 66, temperature 98.4 F (36.9 C), height 5' 6 (1.676 m), weight 207 lb (93.9 kg), SpO2 98%. Body mass index is 33.41 kg/m.  General: she is overweight, cooperative and in no acute distress. PSYCH: Has normal mood, affect and thought process.   HEENT: EOMI, sclerae are anicteric. Lungs: Normal breathing effort, no conversational dyspnea. Extremities: Moves * 4 Neurologic: A and O * 3, good insight  DIAGNOSTIC DATA REVIEWED: BMET    Component Value Date/Time   NA 143 01/08/2024 1518   NA 139 10/09/2023 1338   K 3.9  01/08/2024 1518   CL 106 01/08/2024 1518   CO2 24 01/08/2024 1518   GLUCOSE 108 (H) 01/08/2024 1518   BUN 9 01/08/2024 1518   BUN 14 10/09/2023 1338   CREATININE 0.65 01/08/2024 1518   CALCIUM  9.0 01/08/2024 1518   GFRNONAA >60 01/04/2024 0335   GFRNONAA 106 02/16/2020 1144   GFRAA 123 02/16/2020 1144   Lab Results  Component Value Date   HGBA1C 5.6 01/08/2024   HGBA1C 5.2 05/19/2013   Lab Results  Component Value Date   INSULIN  17.8 05/14/2023   Lab Results  Component Value Date   TSH 1.890 05/14/2023   CBC    Component Value Date/Time   WBC 9.1 01/08/2024 1518   RBC 4.31 01/08/2024 1518   HGB 12.6 01/08/2024 1518   HGB 13.4 10/09/2023 1338   HCT 37.5 01/08/2024 1518   HCT 42.4 10/09/2023 1338   PLT 330 01/08/2024 1518   PLT 328 10/09/2023 1338   MCV 87.0 01/08/2024 1518   MCV 90 10/09/2023 1338   MCH 29.2 01/08/2024 1518   MCHC 33.6 01/08/2024 1518   RDW 12.9 01/08/2024 1518   RDW 13.1 10/09/2023 1338   Iron Studies No  results found for: IRON, TIBC, FERRITIN, IRONPCTSAT Lipid Panel     Component Value Date/Time   CHOL 212 (H) 05/14/2023 1057   TRIG 134 05/14/2023 1057   HDL 59 05/14/2023 1057   CHOLHDL 3.6 05/14/2023 1057   CHOLHDL 3.5 01/16/2023 0845   VLDL 26 05/28/2019 0955   LDLCALC 129 (H) 05/14/2023 1057   LDLCALC 125 (H) 01/16/2023 0845   Hepatic Function Panel     Component Value Date/Time   PROT 6.3 01/08/2024 1518   PROT 7.1 10/09/2023 1338   ALBUMIN 3.9 01/01/2024 0705   ALBUMIN 4.4 10/09/2023 1338   AST 22 01/08/2024 1518   ALT 32 (H) 01/08/2024 1518   ALKPHOS 131 (H) 01/01/2024 0705   BILITOT 0.3 01/08/2024 1518   BILITOT 0.4 10/09/2023 1338   BILIDIR 0.16 06/25/2023 0821   IBILI 0.3 03/02/2023 0943      Component Value Date/Time   TSH 1.890 05/14/2023 1057   Nutritional Lab Results  Component Value Date   VD25OH 40.3 10/08/2023   VD25OH 13.0 (L) 05/14/2023    Attestations:   LILLETTE Sonny Laroche, acting as a stage manager for Barnie Jenkins, DO., have compiled all relevant documentation for today's office visit on behalf of Barnie Jenkins, DO, while in the presence of [employer], DO.   I have reviewed the above documentation for accuracy and completeness, and I agree with the above. Barnie JINNY Jenkins, D.O.  The 21st Century Cures Act was signed into law in 2016 which includes the topic of electronic health records.  This provides immediate access to information in MyChart.  This includes consultation notes, operative notes, office notes, lab results and pathology reports.  If you have any questions about what you read please let us  know at your next visit so we can discuss your concerns and take corrective action if need be.  We are right here with you.

## 2024-01-24 ENCOUNTER — Other Ambulatory Visit: Payer: Self-pay | Admitting: Neurological Surgery

## 2024-01-24 DIAGNOSIS — M5414 Radiculopathy, thoracic region: Secondary | ICD-10-CM

## 2024-01-28 ENCOUNTER — Ambulatory Visit
Admission: RE | Admit: 2024-01-28 | Discharge: 2024-01-28 | Disposition: A | Discharge status: Home / Self Care | Source: Ambulatory Visit | Attending: Neurological Surgery | Admitting: Neurological Surgery

## 2024-01-28 DIAGNOSIS — M5414 Radiculopathy, thoracic region: Secondary | ICD-10-CM

## 2024-02-04 ENCOUNTER — Encounter (INDEPENDENT_AMBULATORY_CARE_PROVIDER_SITE_OTHER): Payer: Self-pay | Admitting: Family Medicine

## 2024-02-04 ENCOUNTER — Ambulatory Visit (INDEPENDENT_AMBULATORY_CARE_PROVIDER_SITE_OTHER): Admitting: Family Medicine

## 2024-02-04 VITALS — BP 136/83 | HR 64 | Temp 97.7°F | Ht 66.0 in | Wt 213.0 lb

## 2024-02-04 DIAGNOSIS — E559 Vitamin D deficiency, unspecified: Secondary | ICD-10-CM

## 2024-02-04 DIAGNOSIS — E1159 Type 2 diabetes mellitus with other circulatory complications: Secondary | ICD-10-CM | POA: Diagnosis not present

## 2024-02-04 DIAGNOSIS — E669 Obesity, unspecified: Secondary | ICD-10-CM

## 2024-02-04 DIAGNOSIS — E1165 Type 2 diabetes mellitus with hyperglycemia: Secondary | ICD-10-CM | POA: Diagnosis not present

## 2024-02-04 DIAGNOSIS — F430 Acute stress reaction: Secondary | ICD-10-CM | POA: Diagnosis not present

## 2024-02-04 DIAGNOSIS — I152 Hypertension secondary to endocrine disorders: Secondary | ICD-10-CM

## 2024-02-04 DIAGNOSIS — Z6834 Body mass index (BMI) 34.0-34.9, adult: Secondary | ICD-10-CM

## 2024-02-04 DIAGNOSIS — Z7984 Long term (current) use of oral hypoglycemic drugs: Secondary | ICD-10-CM | POA: Diagnosis not present

## 2024-02-04 DIAGNOSIS — Z6833 Body mass index (BMI) 33.0-33.9, adult: Secondary | ICD-10-CM

## 2024-02-04 MED ORDER — METFORMIN HCL ER 500 MG PO TB24: 500.0000 mg | ORAL_TABLET | Freq: Every day | ORAL | 1 refills | Status: DC

## 2024-02-04 MED ORDER — VITAMIN D (ERGOCALCIFEROL) 1.25 MG (50000 UNIT) PO CAPS: 50000.0000 [IU] | ORAL_CAPSULE | ORAL | 0 refills | Status: DC

## 2024-02-04 NOTE — Progress Notes (Signed)
 [name] DOROTHA [name], D.O.  ABFM, ABOM Specializing in Clinical Bariatric Medicine  Office located at: [address] [place], [place]  [address]      A) FOR THE CHRONIC DISEASE OF OBESITY: Obesity, Starting BMI 35.04 BMI 33.0-33.9,adult - current BMI 34.4  Chief complaint: Obesity [name] is here to discuss her progress with her obesity treatment plan.   History of present illness / Interval history:  [name] is here today for her follow-up office visit.  Since last OV on 01/10/2024, pt is up 6 lbs.  Has had recent life stressors and her husband being out of town for majority of the week has made it difficult to adhere to health goals recently.     01/10/24 12:00 02/04/24 12:00   Body Fat % 46.1 % 47 %  Muscle Mass (lbs) 106.4 lbs 107.6 lbs  Fat Mass (lbs) 95.8 lbs 100.4 lbs  Total Body Water (lbs) 76 lbs 78.6 lbs  Visceral Fat Rating  12 13  Counseling done on how various foods will affect these numbers and how to maximize success   Total lbs lost to date: -4 lbs Total Fat Mass in lbs lost to date: -0.6 Total weight loss percentage to date: -1.84 %   Nutrition Therapy She is  journaling 1300 with 85+ g protein and states she is following her eating plan approximately 50 % of the time.   - Tracking Calories/Macros: no - states the journaling has not been working recently.  - Eating More Whole Foods: no - has been trying to eat more whole foods  - Adequate Protein Intake: yes  - Adequate Water Intake: yes  - Skipping Meals: yes  - Sleeping 7-9 Hours/ Night: yes   [name] is currently in the action stage of change. As such, her goal is to continue weight management plan.  She has agreed to: continue current plan   Physical Activity Pt has been walking some recently.    [name] has been advised to work up to 300-450 minutes of moderate intensity aerobic activity a week and strengthening exercises 2-3 times per week for cardiovascular health, weight loss  maintenance and preservation of muscle mass.  She has agreed to : Think about enjoyable ways to increase daily physical activity and overcoming barriers to exercise   Behavioral Modifications Evidence-based interventions for health behavior change were utilized today including the discussion of  1) self monitoring techniques:  meal prepping 2) problem-solving barriers:  recent death in the family, meal-prepping, and low-energy.  3) self care:  stress-management, meditation  4) SMART goals for next OV:  Meal prep and try to exercise some.  Regarding patient's less desirable eating habits and patterns, we employed the technique of small changes.   We discussed the following today: increasing lean protein intake to established goals, increasing lower glycemic fruits, avoiding skipping meals, increasing water intake , work on meal planning and preparation, work on tracking and journaling calories using tracking application, work on managing stress, creating time for self-care and relaxation, focusing on food with a 10:1 ratio of calories: grams of protein, and discussed pre-packaged healthier meals such as [employer], Whole 30 chicken meals, Longlife meal prep and Factor Meals etc Additional resources provided today: Handout on traveling and holiday eating strategies, Handout on risks/ benefits of metformin  and associated Myths of use, and Handout on benefits of metformin  for weight loss. Broke down calories and grams of each protein for each meal, so if she wants to  buy meals she knows what to look for.    Medical Interventions/ Pharmacotherapy Previous Bariatric surgery: none Pharmacotherapy for weight loss: She is not currently taking medications  for medical weight loss.    Was previously on Ozempic  but discontinued due to Nausea and vomiting. Was also previously on Mounjaro  2.5 mg once weekly, but discontinued due to hospitalization.   We discussed various medication options to help  [name] with her weight loss efforts and we both agreed to : See T2DM note.   B) OBESITY RELATED CONDITIONS ADDRESSED TODAY:   Hypertension associated with diabetes [facility]) Assessment & Plan BP Readings from Last 3 Encounters:  02/04/24 136/83  01/10/24 132/73  01/08/24 130/82   The 10-year ASCVD risk score (Arnett DK, et al., 2019) is: 6.3%  Lab Results  Component Value Date   CREATININE 0.65 01/08/2024  BP is well controlled today at 136/83. Currently on Losartan  50 mg daily and Norvasc  10 mg daily with good compliance and tolerance. Pt asx. No acute concerns today. Cont low-sodium, heart-healthy diet. Increase exercise as able. F/u with PCP as needed. Will cont to monitor.     Type 2 diabetes mellitus with hyperglycemia, without long-term current use of insulin  [facility]) Assessment & Plan Lab Results  Component Value Date   HGBA1C 5.6 01/08/2024   HGBA1C 5.5 10/08/2023   HGBA1C 6.7 (H) 05/14/2023   INSULIN  17.8 05/14/2023  Currently managed by dietary and lifestyle interventions. Was previously on Mounjaro  but discontinued use after hospitalization. Pt reports that she wants to back on the shot, as she feels its helps her more despite the side effects. Educated pt that because she's had the side effects in the past, she will continue to have side effects.   Pt feels that she needs medicine to lose weight, desires to go back to Ozempic  and Mounjaro , Had side effects, but less side effects on the Ozempic . Pt is high-risk for GI side effects; discussed the need to eat on plan and avoid skipping meals, and decrease simple carbs. After long discussion of varius treatment options pt desires to start low-dose Metformin .   Will help with fat metabolism and with weight-management.   Start Metformin -XR 500 mg once a day. Educated pt to avoid skipping meals to help reduce GI side effects.   Counseled on calories and grams of protein for each one of of her meals because the journaling was not  working for her.     Vitamin D  deficiency Assessment & Plan Lab Results  Component Value Date   VD25OH 40.3 10/08/2023   VD25OH 13.0 (L) 05/14/2023  Currently on Ergo 50K units once weekly  with good compliance and tolerance. Vit D levels below goal at 40.3; optimal between 50-70. No acute concerns today. Cont regimen. Will recheck levels as necessary.     Acute reaction to stress Assessment & Plan Recently her mother-in-law passed away and her husband has been out of town, which she reports has made it difficult to adhere to health goals. Encouraged pt to implement stress management strategies such as meditation.  Increase exercise as able and obtain adequate sleep to help manage stress.     Medications Discontinued During This Encounter  Medication Reason   tirzepatide  (MOUNJARO ) 2.5 MG/0.5ML Pen    Vitamin D , Ergocalciferol , (DRISDOL ) 1.25 MG (50000 UNIT) CAPS capsule Reorder     Meds ordered this encounter  Medications   metFORMIN  (GLUCOPHAGE -XR) 500 MG 24 hr tablet    Sig: Take 1 tablet (500 mg total) by  mouth daily with breakfast.    Dispense:  30 tablet    Refill:  1   Vitamin D , Ergocalciferol , (DRISDOL ) 1.25 MG (50000 UNIT) CAPS capsule    Sig: Take 1 capsule (50,000 Units total) by mouth every 7 (seven) days.    Dispense:  4 capsule    Refill:  0      Follow up:   No follow-ups on file. *** She was informed of the importance of frequent follow up visits to maximize her success with intensive lifestyle modifications for her multiple health conditions.   Weight Summary and Biometrics   Weight Lost Since Last Visit: 0lb  Weight Gained Since Last Visit: 6lb    Vitals Temp: 97.7 F (36.5 C) BP: 136/83 Pulse Rate: 64 SpO2: 96 %   Anthropometric Measurements Height: 5' 6 (1.676 m) Weight: 213 lb (96.6 kg) BMI (Calculated): 34.4 Weight at Last Visit: 210lb Weight Lost Since Last Visit: 0lb Weight Gained Since Last Visit: 6lb Starting Weight:  217lb Total Weight Loss (lbs): 1 lb (0.454 kg)   Body Composition  Body Fat %: 47 % Fat Mass (lbs): 100.4 lbs Muscle Mass (lbs): 107.6 lbs Total Body Water (lbs): 78.6 lbs Visceral Fat Rating : 13   Other Clinical Data Fasting: no Labs: no Today's Visit #: 11 Starting Date: 05/14/23    Objective:   PHYSICAL EXAM: Blood pressure 136/83, pulse 64, temperature 97.7 F (36.5 C), height 5' 6 (1.676 m), weight 213 lb (96.6 kg), SpO2 96%. Body mass index is 34.38 kg/m.  General: she is overweight, cooperative and in no acute distress. PSYCH: Has normal mood, affect and thought process.   HEENT: EOMI, sclerae are anicteric. Lungs: Normal breathing effort, no conversational dyspnea. Extremities: Moves * 4 Neurologic: A and O * 3, good insight  DIAGNOSTIC DATA REVIEWED: BMET    Component Value Date/Time   NA 143 01/08/2024 1518   NA 139 10/09/2023 1338   K 3.9 01/08/2024 1518   CL 106 01/08/2024 1518   CO2 24 01/08/2024 1518   GLUCOSE 108 (H) 01/08/2024 1518   BUN 9 01/08/2024 1518   BUN 14 10/09/2023 1338   CREATININE 0.65 01/08/2024 1518   CALCIUM  9.0 01/08/2024 1518   GFRNONAA >60 01/04/2024 0335   GFRNONAA 106 02/16/2020 1144   GFRAA 123 02/16/2020 1144   Lab Results  Component Value Date   HGBA1C 5.6 01/08/2024   HGBA1C 5.2 05/19/2013   Lab Results  Component Value Date   INSULIN  17.8 05/14/2023   Lab Results  Component Value Date   TSH 1.890 05/14/2023   CBC    Component Value Date/Time   WBC 9.1 01/08/2024 1518   RBC 4.31 01/08/2024 1518   HGB 12.6 01/08/2024 1518   HGB 13.4 10/09/2023 1338   HCT 37.5 01/08/2024 1518   HCT 42.4 10/09/2023 1338   PLT 330 01/08/2024 1518   PLT 328 10/09/2023 1338   MCV 87.0 01/08/2024 1518   MCV 90 10/09/2023 1338   MCH 29.2 01/08/2024 1518   MCHC 33.6 01/08/2024 1518   RDW 12.9 01/08/2024 1518   RDW 13.1 10/09/2023 1338   Iron Studies No results found for: IRON, TIBC, FERRITIN,  IRONPCTSAT Lipid Panel     Component Value Date/Time   CHOL 212 (H) 05/14/2023 1057   TRIG 134 05/14/2023 1057   HDL 59 05/14/2023 1057   CHOLHDL 3.6 05/14/2023 1057   CHOLHDL 3.5 01/16/2023 0845   VLDL 26 05/28/2019 0955   LDLCALC 129 (H) 05/14/2023  1057   LDLCALC 125 (H) 01/16/2023 0845   Hepatic Function Panel     Component Value Date/Time   PROT 6.3 01/08/2024 1518   PROT 7.1 10/09/2023 1338   ALBUMIN  3.9 01/01/2024 0705   ALBUMIN  4.4 10/09/2023 1338   AST 22 01/08/2024 1518   ALT 32 (H) 01/08/2024 1518   ALKPHOS 131 (H) 01/01/2024 0705   BILITOT 0.3 01/08/2024 1518   BILITOT 0.4 10/09/2023 1338   BILIDIR 0.16 06/25/2023 0821   IBILI 0.3 03/02/2023 0943      Component Value Date/Time   TSH 1.890 05/14/2023 1057   Nutritional Lab Results  Component Value Date   VD25OH 40.3 10/08/2023   VD25OH 13.0 (L) 05/14/2023    Attestations:   I, ***, acting as a medical scribe for [name] Jenkins, DO., have compiled all relevant documentation for today's office visit on behalf of [name] Jenkins, DO, while in the presence of [employer], DO.  I have spent *** minutes in the care of the patient today including *** minutes face-to-face assessing and reviewing listed medical problems above as outlined in office visit note and providing nutritional and behavioral counseling as outlined in obesity care plan.   I have reviewed the above documentation for accuracy and completeness, and I agree with the above. [name], D.O.  The 21st Century Cures Act was signed into law in 2016 which includes the topic of electronic health records.  This provides immediate access to information in MyChart.  This includes consultation notes, operative notes, office notes, lab results and pathology reports.  If you have any questions about what you read please let us  know at your next visit so we can discuss your concerns and take corrective action if need be.  We are right here with  you.

## 2024-02-05 ENCOUNTER — Encounter: Payer: Self-pay | Admitting: Emergency Medicine

## 2024-02-05 ENCOUNTER — Ambulatory Visit
Admission: EM | Admit: 2024-02-05 | Discharge: 2024-02-05 | Disposition: A | Discharge status: Home / Self Care | Attending: Family Medicine | Admitting: Family Medicine

## 2024-02-05 DIAGNOSIS — R10A1 Flank pain, right side: Secondary | ICD-10-CM

## 2024-02-05 MED ORDER — TIZANIDINE HCL 4 MG PO TABS: 4.0000 mg | ORAL_TABLET | Freq: Three times a day (TID) | ORAL | 0 refills | Status: AC | PRN

## 2024-02-05 NOTE — ED Provider Notes (Signed)
 [facility]    CSN: [id number] Arrival date & time: 02/05/24  1647      History   Chief Complaint No chief complaint on file.   HPI [name] is a 58 y.o. female.   Patient presenting today with 2-day history of right flank pain in the area of her rib cage from front to midline back.  States movement and deep breathing make the pain worse and she has not noticed anything to make the pain better.  At times causes her to feel a bit short of breath but denies wheezing, fevers, chest pain, palpitations, dizziness, abdominal pain, urinary symptoms, nausea, vomiting, significant bowel changes though does note that her bowels have been going from diarrhea to mild constipation intermittently recently.  Has tried ibuprofen  with minimal relief at home.  No past history of similar.    Past Medical History:  Diagnosis Date   Anxiety    Anxiety    Back pain    Bipolar 1 disorder (HCC)    Bipolar disorder (HCC)    Chest pain    Constipation    Depression    Depression    Diabetes (HCC)    Diabetes mellitus without complication (HCC)    Type 2   Dyspnea    r/t anxiety   Encounter for neuropsychological testing    at [facility] 3/20-suggest possible somatization   Fatty liver    GERD (gastroesophageal reflux disease)    Headache    High blood pressure    High cholesterol    Hypertension    Joint pain    Migraines    OSA on CPAP    moderate obstructive sleep apnea with an AHI of 21.3/h and no central events.  Her supine AHI was 31.3/hr.  O2 saturations were as low as 86%..  On CPAP at 15cm H2O   Pseudoseizure    Last seizure March/April 2024   PTSD (post-traumatic stress disorder)    Rheumatoid arthritis (HCC)    Rosacea    Seizures (HCC)    [facility] (Dr. Euna) complex partial seizure with epileptiform discharges seen in fronto-central region on eeg   Seizures (HCC)    Shortness of breath    Swelling of both lower extremities     Patient Active  Problem List   Diagnosis Date Noted   SBO (small bowel obstruction) (HCC) 01/01/2024   Hypertension associated with type 2 diabetes mellitus (HCC) 09/17/2023   Obesity, Starting BMI 35.04 08/22/2023   Itching in the vaginal area 06/06/2023   Vaginal dryness 06/06/2023   Yeast infection 06/06/2023   Type 2 diabetes mellitus with obesity 05/28/2023   Hypertension associated with diabetes (HCC) 05/28/2023   Hyperlipidemia associated with type 2 diabetes mellitus (HCC) 05/28/2023   Metabolic dysfunction-associated steatotic liver disease (MASLD) 05/28/2023   OSA (obstructive sleep apnea) 05/28/2023   B12 deficiency - new onset 05/28/2023   Vitamin D  deficiency - new onset 05/28/2023   Left foot pain 05/22/2023   Plantar fasciitis 05/22/2023   Current use of estrogen therapy 03/08/2023   Night sweats 12/06/2022   Hot flashes 12/06/2022   S/P hysterectomy 12/06/2022   Encounter for well woman exam with routine gynecological exam 12/06/2022   Encounter for screening fecal occult blood testing 12/06/2022   Pain of right hip joint 02/22/2022   Low back pain 09/21/2021   Pain in joint of left shoulder 07/19/2021   Cervical radiculopathy 07/19/2021   Elevated LFTs 07/04/2021   Fatty liver 07/04/2021  Constipation 07/04/2021   History of colonic polyps 07/04/2021   Migraine without aura and without status migrainosus, not intractable 08/24/2020   History of head injury 08/24/2020   Intractable chronic post-traumatic headache 04/20/2020   History of subarachnoid hemorrhage 04/20/2020   COVID-19 virus infection 12/05/2019   New onset type 2 diabetes mellitus (HCC) 11/12/2019   Subarachnoid hemorrhage following injury (HCC) 11/11/2019   Class 1 obesity due to excess calories with body mass index (BMI) of 34.0 to 34.9 in adult 11/11/2019   Encounter for monitoring postmenopausal estrogen replacement therapy 01/27/2019   Weight loss counseling, encounter for 01/27/2019   Vaginal odor  12/02/2018   Hot flashes due to menopause 12/02/2018   No energy 12/02/2018   Decreased libido 12/02/2018   Counseling for estrogen replacement therapy 12/02/2018   History of seizure 12/02/2018   Encounter for neuropsychological testing    Lumbar stenosis with neurogenic claudication 05/23/2018   Prolapsed lumbar disc 03/14/2018   Lumbar radiculopathy 03/14/2018   Lumbar spondylosis 03/14/2018   Pain in left knee 07/26/2017   Seizures (HCC)    Psychogenic nonepileptic seizure 11/13/2015   Jerking movements of extremities 11/11/2015   Major depressive disorder, recurrent severe without psychotic features (HCC)    Bipolar II disorder (HCC) 08/30/2015   Suicide attempt by drug ingestion (HCC) 08/30/2015   Suicidal ideation 08/30/2015   Migraines 09/29/2014   Migraine 05/19/2013   Left-sided weakness 05/18/2013   Numbness and tingling of left arm and leg 05/18/2013   Cerebral infarction (HCC) 05/18/2013   Hypertension    Anxiety     Past Surgical History:  Procedure Laterality Date   ABDOMINAL HYSTERECTOMY     non cancerous, partial. 1990s   ANTERIOR LAT LUMBAR FUSION N/A 08/03/2022   Procedure: Lumbar two-three extreme Lateral Interbody Fusion;  Surgeon: Colon Shove, MD;  Location: [facility] OR;  Service: Neurosurgery;  Laterality: N/A;   APPENDECTOMY     BACK SURGERY     x2   CHOLECYSTECTOMY     COLONOSCOPY WITH PROPOFOL  N/A 07/20/2021   Surgeon: Shaaron Lamar HERO, MD;  Three 6-8 mm polyps in the descending colon and cecum removed, diverticulosis in the entire colon, otherwise normal exam.  Pathology with tubular adenomas.  Recommended 5-year surveillance.   CYST EXCISION     on ovaries   LAPAROSCOPIC LYSIS OF ADHESIONS N/A 01/03/2024   Procedure: LYSIS, ADHESIONS, LAPAROSCOPIC;  Surgeon: Ann Fine, MD;  Location: [facility] OR;  Service: General;  Laterality: N/A;   LAPAROSCOPY N/A 01/03/2024   Procedure: LAPAROSCOPY, DIAGNOSTIC;  Surgeon: Ann Fine, MD;  Location: [facility] OR;   Service: General;  Laterality: N/A;   lumbar     ruptured disc in lumbar taken out   POLYPECTOMY  07/20/2021   Procedure: POLYPECTOMY;  Surgeon: Shaaron Lamar HERO, MD;  Location: [facility];  Service: Endoscopy;;   ROTATOR CUFF REPAIR Left    TONSILLECTOMY     TUBAL LIGATION      OB History     Gravida  2   Para  2   Term      Preterm      AB      Living  2      SAB      IAB      Ectopic      Multiple      Live Births               Home Medications    Prior to Admission medications  Medication Sig Start Date End Date Taking? Authorizing Provider  tiZANidine  (ZANAFLEX ) 4 MG tablet Take 1 tablet (4 mg total) by mouth every 8 (eight) hours as needed for muscle spasms. Do not drink alcohol or drive while taking this medication.  May cause drowsiness. 02/05/24  Yes Stuart Vernell Norris, PA-C  ACCU-CHEK GUIDE test strip USE AS DIRECTED TO MONITOR FSBS 1X DAILY. DX: R73.09. 02/01/21   Duanne Butler DASEN, MD  amLODipine  (NORVASC ) 10 MG tablet Take 1 tablet (10 mg total) by mouth daily. Patient not taking: Reported on 01/07/2024 05/07/23   Okey Vina GAILS, MD  b complex vitamins capsule With 500mcg b12 daily 05/28/23   Midge Sober, DO  estradiol  (VIVELLE -DOT) 0.1 MG/24HR patch Place 1 patch (0.1 mg total) onto the skin 2 (two) times a week. 06/07/23   Signa Delon LABOR, NP  gabapentin  (NEURONTIN ) 300 MG capsule Start with 300mg  at bedtime x 2 days then BID x 2 days then TID 01/04/24   Vann, Jessica U, DO  HYDROcodone -acetaminophen  (NORCO) 10-325 MG tablet Take 1 tablet by mouth every 4 (four) hours as needed for severe pain (pain score 7-10). Patient not taking: Reported on 02/04/2024 01/05/24   Vann, Jessica U, DO  Lancets MISC Please dispense based on patient and insurance preference. Use as directed to monitor FSBS 1x daily. Dx: R73.09. 12/01/19   Duanne Butler DASEN, MD  lidocaine  (LIDODERM ) 5 % Place 1 patch onto the skin daily. Remove & Discard patch within 12 hours  or as directed by MD 01/05/24   Juvenal Harlene PENNER, DO  losartan  (COZAAR ) 50 MG tablet Take 1 tablet (50 mg total) by mouth daily. 04/10/23   Okey Vina GAILS, MD  lubiprostone  (AMITIZA ) 24 MCG capsule Take 24 mcg by mouth 2 (two) times daily as needed for constipation. 01/01/24   [provider]  metFORMIN  (GLUCOPHAGE -XR) 500 MG 24 hr tablet Take 1 tablet (500 mg total) by mouth daily with breakfast. 02/04/24   Opalski, Sober, DO  methocarbamol  (ROBAXIN ) 500 MG tablet Take 500 mg by mouth at bedtime as needed for muscle spasms.    [provider]  ORACEA  40 MG CPDR Take 40 mg by mouth daily. 08/09/23   Duanne Butler DASEN, MD  rosuvastatin  (CRESTOR ) 20 MG tablet Take 1 tablet (20 mg total) by mouth daily. 05/29/23   Okey Vina GAILS, MD  venlafaxine  XR (EFFEXOR -XR) 150 MG 24 hr capsule Take 2 capsules (300 mg total) by mouth daily with breakfast. 09/03/23   Duanne Butler DASEN, MD  Vitamin D , Ergocalciferol , (DRISDOL ) 1.25 MG (50000 UNIT) CAPS capsule Take 1 capsule (50,000 Units total) by mouth every 7 (seven) days. 02/04/24   Midge Sober, DO  zonisamide  (ZONEGRAN ) 100 MG capsule Take 200mg  daily at bedtime 12/04/23   [name], [name], NP    Family History Family History  Problem Relation Age of Onset   Obesity Father    Hyperlipidemia Father    Diabetes Father    Heart disease Father 46   Hypertension Father    Sleep apnea Father    Breast cancer Paternal Aunt        ? age of dx   Cancer Maternal Grandfather        colon cancer (70's)   Diabetes Paternal Grandmother    Diabetes Paternal Grandfather    Cancer Cousin        breast   Breast cancer Cousin    Breast cancer Cousin        paternal  Breast cancer Cousin    Liver cancer Neg Hx     Social History Social History   Tobacco Use   Smoking status: Former    Current packs/day: 0.00    Average packs/day: 0.5 packs/day for 10.0 years (5.0 ttl pk-yrs)    Types: Cigarettes    Start date: 05/14/1981    Quit date: 05/15/1991     Years since quitting: 32.7   Smokeless tobacco: Never  Vaping Use   Vaping status: Never Used  Substance Use Topics   Alcohol use: Yes    Comment: socially   Drug use: No     Allergies   Penicillins, Toradol  [ketorolac  tromethamine ], Dilaudid  [hydromorphone ], Prednisone , Lamictal [lamotrigine], and Latex   Review of Systems Review of Systems Per HPI  Physical Exam Triage Vital Signs ED Triage Vitals  Encounter Vitals Group     BP 02/05/24 1701 (!) 156/91     Girls Systolic BP Percentile --      Girls Diastolic BP Percentile --      Boys Systolic BP Percentile --      Boys Diastolic BP Percentile --      Pulse Rate 02/05/24 1701 76     Resp 02/05/24 1701 16     Temp 02/05/24 1701 98 F (36.7 C)     Temp Source 02/05/24 1701 Oral     SpO2 02/05/24 1701 97 %     Weight --      Height --      Head Circumference --      Peak Flow --      Pain Score 02/05/24 1703 9     Pain Loc --      Pain Education --      Exclude from Growth Chart --    No data found.  Updated Vital Signs BP (!) 156/91 (BP Location: Right Arm)   Pulse 76   Temp 98 F (36.7 C) (Oral)   Resp 16   SpO2 97%   Visual Acuity Right Eye Distance:   Left Eye Distance:   Bilateral Distance:    Right Eye Near:   Left Eye Near:    Bilateral Near:     Physical Exam Vitals and nursing note reviewed.  Constitutional:      Appearance: Normal appearance. She is not ill-appearing.  HENT:     Head: Atraumatic.  Eyes:     Extraocular Movements: Extraocular movements intact.     Conjunctiva/sclera: Conjunctivae normal.  Cardiovascular:     Rate and Rhythm: Normal rate and regular rhythm.     Heart sounds: Normal heart sounds.  Pulmonary:     Effort: Pulmonary effort is normal.     Breath sounds: Normal breath sounds. No wheezing or rales.     Comments: Chest rise symmetric bilaterally Musculoskeletal:        General: Normal range of motion.     Cervical back: Normal range of motion and neck  supple.     Comments: Minimal tenderness only with deep palpation of the anterior, lateral and posterior right rib region.  No bony deformity palpable, no point tenderness appreciated.  Chest rise symmetric bilaterally.  Range of motion of bilateral upper extremities and torso intact  Skin:    General: Skin is warm and dry.     Findings: No rash.  Neurological:     Mental Status: She is alert and oriented to person, place, and time.     Comments: Extremities neurovascularly intact diffusely  Psychiatric:  Mood and Affect: Mood normal.        Thought Content: Thought content normal.        Judgment: Judgment normal.      UC Treatments / Results  Labs (all labs ordered are listed, but only abnormal results are displayed) Labs Reviewed  CBC WITH DIFFERENTIAL/PLATELET  LIPASE  COMPREHENSIVE METABOLIC PANEL WITH GFR    EKG   Radiology No results found.  Procedures Procedures (including critical care time)  Medications Ordered in UC Medications - No data to display  Initial Impression / Assessment and Plan / UC Course  I have reviewed the triage vital signs and the nursing notes.  Pertinent labs & imaging results that were available during my care of the patient were reviewed by me and considered in my medical decision making (see chart for details).     Minimally hypertensive in triage otherwise vital signs within normal limits.  She is well-appearing, in no acute distress and able to sit, stand and ambulate comfortably.  Exam is very reassuring today.  She is agreeable to chest and right rib x-ray, labs.  Order placed for the x-ray and information given on where to get this done tomorrow and labs pending.  She declines further testing/evaluation at this time and otherwise would like to try muscle relaxer, anti-inflammatories, heat, rest.  Follow-up for worsening or unresolving symptoms.  Final Clinical Impressions(s) / UC Diagnoses   Final diagnoses:  Right flank  pain     Discharge Instructions      You may go to the [facility] outpatient imaging center to obtain the x-ray that I have ordered during business hours on the weekdays.  We will let you know if anything comes back abnormal on that or your lab work that we have drawn today.  Follow-up with your primary care provider soon as possible for recheck.  You may take 800 mg of ibuprofen  up to 4 times daily as needed, use heat, muscle relaxers, Tylenol  for breakthrough pain.  Follow-up for worsening or unresolving symptoms    ED Prescriptions     Medication Sig Dispense Auth. Provider   tiZANidine (ZANAFLEX) 4 MG tablet Take 1 tablet (4 mg total) by mouth every 8 (eight) hours as needed for muscle spasms. Do not drink alcohol or drive while taking this medication.  May cause drowsiness. 15 tablet Stuart Vernell Norris, [place]      PDMP not reviewed this encounter.   Stuart Vernell Norris, [place] 02/05/24 [phone]

## 2024-02-05 NOTE — ED Triage Notes (Signed)
 Pain under right breast that radiates around to back since yesterday.  States it hurts to breath and pain goes and goes

## 2024-02-05 NOTE — Discharge Instructions (Signed)
 You may go to the [facility] outpatient imaging center to obtain the x-ray that I have ordered during business hours on the weekdays.  We will let you know if anything comes back abnormal on that or your lab work that we have drawn today.  Follow-up with your primary care provider soon as possible for recheck.  You may take 800 mg of ibuprofen  up to 4 times daily as needed, use heat, muscle relaxers, Tylenol  for breakthrough pain.  Follow-up for worsening or unresolving symptoms

## 2024-02-06 ENCOUNTER — Ambulatory Visit (HOSPITAL_COMMUNITY)
Admission: RE | Admit: 2024-02-06 | Discharge: 2024-02-06 | Disposition: A | Source: Ambulatory Visit | Attending: Family Medicine

## 2024-02-06 ENCOUNTER — Ambulatory Visit: Payer: Self-pay | Admitting: Family Medicine

## 2024-02-06 DIAGNOSIS — R10A1 Flank pain, right side: Secondary | ICD-10-CM | POA: Diagnosis present

## 2024-02-07 LAB — COMPREHENSIVE METABOLIC PANEL WITH GFR
ALT: 30 IU/L (ref 0–32)
AST: 21 IU/L (ref 0–40)
Albumin: 4.3 g/dL (ref 3.8–4.9)
Alkaline Phosphatase: 167 IU/L — ABNORMAL HIGH (ref 49–135)
BUN/Creatinine Ratio: 18 (ref 9–23)
BUN: 11 mg/dL (ref 6–24)
Bilirubin Total: 0.4 mg/dL (ref 0.0–1.2)
CO2: 19 mmol/L — ABNORMAL LOW (ref 20–29)
Calcium: 8.8 mg/dL (ref 8.7–10.2)
Chloride: 105 mmol/L (ref 96–106)
Creatinine, Ser: 0.61 mg/dL (ref 0.57–1.00)
Globulin, Total: 2 g/dL (ref 1.5–4.5)
Glucose: 155 mg/dL — ABNORMAL HIGH (ref 70–99)
Potassium: 4.1 mmol/L (ref 3.5–5.2)
Sodium: 141 mmol/L (ref 134–144)
Total Protein: 6.3 g/dL (ref 6.0–8.5)
eGFR: 105 mL/min/1.73 (ref >59)

## 2024-02-07 LAB — CBC WITH DIFFERENTIAL/PLATELET
Basophils Absolute: 0.1 x10E3/uL (ref 0.0–0.2)
Basos: 1 %
EOS (ABSOLUTE): 0.3 x10E3/uL (ref 0.0–0.4)
Eos: 4 %
Hematocrit: 39.8 % (ref 34.0–46.6)
Hemoglobin: 12.9 g/dL (ref 11.1–15.9)
Immature Grans (Abs): 0 x10E3/uL (ref 0.0–0.1)
Immature Granulocytes: 0 %
Lymphocytes Absolute: 2.5 x10E3/uL (ref 0.7–3.1)
Lymphs: 35 %
MCH: 28.5 pg (ref 26.6–33.0)
MCHC: 32.4 g/dL (ref 31.5–35.7)
MCV: 88 fL (ref 79–97)
Monocytes Absolute: 0.4 x10E3/uL (ref 0.1–0.9)
Monocytes: 6 %
Neutrophils Absolute: 4 x10E3/uL (ref 1.4–7.0)
Neutrophils: 54 %
Platelets: 287 x10E3/uL (ref 150–450)
RBC: 4.52 x10E6/uL (ref 3.77–5.28)
RDW: 13.3 % (ref 11.7–15.4)
WBC: 7.3 x10E3/uL (ref 3.4–10.8)

## 2024-02-07 LAB — LIPASE: Lipase: 19 U/L (ref 14–72)

## 2024-02-21 ENCOUNTER — Encounter: Payer: Self-pay | Admitting: Family Medicine

## 2024-02-27 ENCOUNTER — Other Ambulatory Visit (INDEPENDENT_AMBULATORY_CARE_PROVIDER_SITE_OTHER): Payer: Self-pay | Admitting: Family Medicine

## 2024-03-11 ENCOUNTER — Ambulatory Visit (INDEPENDENT_AMBULATORY_CARE_PROVIDER_SITE_OTHER): Admitting: Family Medicine

## 2024-03-18 ENCOUNTER — Ambulatory Visit (INDEPENDENT_AMBULATORY_CARE_PROVIDER_SITE_OTHER): Admitting: Family Medicine

## 2024-03-20 ENCOUNTER — Ambulatory Visit (INDEPENDENT_AMBULATORY_CARE_PROVIDER_SITE_OTHER): Admitting: Family Medicine

## 2024-03-20 ENCOUNTER — Encounter (INDEPENDENT_AMBULATORY_CARE_PROVIDER_SITE_OTHER): Payer: Self-pay | Admitting: Family Medicine

## 2024-03-20 VITALS — BP 122/76 | HR 76 | Temp 97.4°F | Ht 66.0 in | Wt 213.0 lb

## 2024-03-20 DIAGNOSIS — E669 Obesity, unspecified: Secondary | ICD-10-CM | POA: Diagnosis not present

## 2024-03-20 DIAGNOSIS — Z7984 Long term (current) use of oral hypoglycemic drugs: Secondary | ICD-10-CM

## 2024-03-20 DIAGNOSIS — E559 Vitamin D deficiency, unspecified: Secondary | ICD-10-CM | POA: Diagnosis not present

## 2024-03-20 DIAGNOSIS — Z6834 Body mass index (BMI) 34.0-34.9, adult: Secondary | ICD-10-CM

## 2024-03-20 DIAGNOSIS — E119 Type 2 diabetes mellitus without complications: Secondary | ICD-10-CM | POA: Diagnosis not present

## 2024-03-20 MED ORDER — VITAMIN D (ERGOCALCIFEROL) 1.25 MG (50000 UNIT) PO CAPS: 50000.0000 [IU] | ORAL_CAPSULE | ORAL | 0 refills | Status: AC

## 2024-03-20 MED ORDER — TIRZEPATIDE 2.5 MG/0.5ML ~~LOC~~ SOAJ: 2.5000 mg | SUBCUTANEOUS | 0 refills | Status: AC

## 2024-03-20 MED ORDER — METFORMIN HCL ER 500 MG PO TB24: 500.0000 mg | ORAL_TABLET | Freq: Every day | ORAL | 1 refills | Status: AC

## 2024-03-20 NOTE — Progress Notes (Signed)
 "  Office: [phone]  /  Fax: [phone]  WEIGHT SUMMARY AND BIOMETRICS  Anthropometric Measurements Height: 5' 6 (1.676 m) Weight: 213 lb (96.6 kg) BMI (Calculated): 34.4 Weight at Last Visit: 213 lb Weight Lost Since Last Visit: 0 Weight Gained Since Last Visit: 0 Starting Weight: 217 lb Total Weight Loss (lbs): 4 lb (1.814 kg) Peak Weight: 232 lb   Body Composition  Body Fat %: 46.3 % Fat Mass (lbs): 98.8 lbs Muscle Mass (lbs): 108.6 lbs Total Body Water (lbs): 78 lbs Visceral Fat Rating : 12   Other Clinical Data Fasting: no Labs: no Today's Visit #: 12 Starting Date: 05/14/23    Chief Complaint: OBESITY   History of Present Illness [name] is a 59 year old female who presents for obesity treatment plan assessment and progress evaluation. She is normally a patient of Dr. Midge.  She has been following a category two eating plan 90% of the time, incorporating more fruits and vegetables into her diet, and engaging in physical activity by walking 15 minutes two days a week. Despite these efforts, she has been skipping meals and not hydrating adequately. She has maintained her weight over the last six weeks, which she finds challenging due to the holiday season. Her husband, who does most of the cooking, was recently diagnosed with diabetes, leading to dietary changes at home, such as reducing fried foods and using healthier oils. She acknowledges a family history of obesity as a contributing factor to her weight issues.  Her type 2 diabetes is managed with metformin  500 mg daily with breakfast. Her hemoglobin A1c improved from 6.7% in March 2025 to 5.6% in November 2025. No gastrointestinal side effects from metformin .  She is being treated for vitamin D  deficiency with prescription ergocalciferol  50,000 IU per week. Her last recorded vitamin D  level was 40.3 ng/mL in August of the previous year.  She has previously tried GLP-1 receptor agonists, including  Ozempic  and Mounjaro , but experienced persistent sickness and a gastrointestinal issue where her intestines were found to be twisted, though not blocked. Her primary doctor suspected a link to Mounjaro , but her GI doctor did not confirm this association.      PHYSICAL EXAM:  Blood pressure 122/76, pulse 76, temperature (!) 97.4 F (36.3 C), height 5' 6 (1.676 m), weight 213 lb (96.6 kg), SpO2 95%. Body mass index is 34.38 kg/m.  DIAGNOSTIC DATA REVIEWED BY MYSELF TODAY:  BMET    Component Value Date/Time   NA 141 02/05/2024 1800   K 4.1 02/05/2024 1800   CL 105 02/05/2024 1800   CO2 19 (L) 02/05/2024 1800   GLUCOSE 155 (H) 02/05/2024 1800   GLUCOSE 108 (H) 01/08/2024 1518   BUN 11 02/05/2024 1800   CREATININE 0.61 02/05/2024 1800   CREATININE 0.65 01/08/2024 1518   CALCIUM  8.8 02/05/2024 1800   GFRNONAA >60 01/04/2024 0335   GFRNONAA 106 02/16/2020 1144   GFRAA 123 02/16/2020 1144   Lab Results  Component Value Date   HGBA1C 5.6 01/08/2024   HGBA1C 5.2 05/19/2013   Lab Results  Component Value Date   INSULIN  17.8 05/14/2023   Lab Results  Component Value Date   TSH 1.890 05/14/2023   CBC    Component Value Date/Time   WBC 7.3 02/05/2024 1800   WBC 9.1 01/08/2024 1518   RBC 4.52 02/05/2024 1800   RBC 4.31 01/08/2024 1518   HGB 12.9 02/05/2024 1800   HCT 39.8 02/05/2024 1800   PLT 287 02/05/2024  1800   MCV 88 02/05/2024 1800   MCH 28.5 02/05/2024 1800   MCH 29.2 01/08/2024 1518   MCHC 32.4 02/05/2024 1800   MCHC 33.6 01/08/2024 1518   RDW 13.3 02/05/2024 1800   Iron Studies No results found for: IRON, TIBC, FERRITIN, IRONPCTSAT Lipid Panel     Component Value Date/Time   CHOL 212 (H) 05/14/2023 1057   TRIG 134 05/14/2023 1057   HDL 59 05/14/2023 1057   CHOLHDL 3.6 05/14/2023 1057   CHOLHDL 3.5 01/16/2023 0845   VLDL 26 05/28/2019 0955   LDLCALC 129 (H) 05/14/2023 1057   LDLCALC 125 (H) 01/16/2023 0845   Hepatic Function Panel      Component Value Date/Time   PROT 6.3 02/05/2024 1800   ALBUMIN  4.3 02/05/2024 1800   AST 21 02/05/2024 1800   ALT 30 02/05/2024 1800   ALKPHOS 167 (H) 02/05/2024 1800   BILITOT 0.4 02/05/2024 1800   BILIDIR 0.16 06/25/2023 0821   IBILI 0.3 03/02/2023 0943      Component Value Date/Time   TSH 1.890 05/14/2023 1057   Nutritional Lab Results  Component Value Date   VD25OH 40.3 10/08/2023   VD25OH 13.0 (L) 05/14/2023     Assessment and Plan Assessment & Plan Obesity Management is ongoing with a focus on dietary changes and exercise. She has been following the category two eating plan 90% of the time, exercising 15 minutes twice a week, and increasing fruit and vegetable intake. However, she is skipping meals and not hydrating adequately. Weight has been maintained over the last six weeks, which is challenging during the holidays. Previous trials of GLP-1 agonists (Ozempic  and Mounjaro ) resulted in gastrointestinal issues and a possible intestinal blockage, though the latter was not confirmed by her GI specialist. Mounjaro  is recommended due to fewer side effects compared to Ozempic . Emphasis on proper nutrition to avoid triggering metabolic slowdown and malnourishment. - Continue category two eating plan. - Encouraged regular meal intake, especially protein, to prevent metabolic slowdown. - Recommended Mounjaro  2.5 mg weekly, starting with the first dose this week, to be taken after a substantial meal. - Advised to keep Mounjaro  refrigerated until use. - Encouraged hydration, aiming for at least 6 ounces of water daily, and more if possible. - Focus on nutrition first, then gradually increase exercise.  Type 2 diabetes mellitus Type 2 diabetes is managed with metformin  500 mg daily. Hemoglobin A1c improved from 6.7% in March 2025 to 5.6% in November 2025. No gastrointestinal side effects reported from metformin . Dietary changes are being implemented with her husband's support,  focusing on healthier cooking methods and portion control. - Continue metformin  500 mg daily with breakfast. - Refilled metformin  prescription. - Encouraged dietary changes with husband's support, focusing on healthier cooking methods and portion control.  Vitamin D  deficiency Managed with ergocalciferol  50,000 IU weekly. Last vitamin D  level was 40.3 in August of the previous year. - Continue ergocalciferol  50,000 IU weekly. - Refilled ergocalciferol  prescription.      Patients who are on anti-obesity medications are counseled on the importance of maintaining healthy lifestyle habits, including balanced nutrition, regular physical activity, and behavioral modifications,  Medication is an adjunct to, not a replacement for, lifestyle changes and that the long-term success and weight maintenance depend on continued adherence to these strategies.   Linnaea was informed of the importance of frequent follow up visits to maximize her success with intensive lifestyle modifications for her obesity and obesity related health conditions as recommended by USPSTF and CMS guidelines  Louann Penton, MD   "

## 2024-03-24 ENCOUNTER — Other Ambulatory Visit: Payer: Self-pay | Admitting: Adult Health

## 2024-03-28 ENCOUNTER — Other Ambulatory Visit: Payer: Self-pay | Admitting: Adult Health

## 2024-04-02 ENCOUNTER — Other Ambulatory Visit: Payer: Self-pay | Admitting: Adult Health

## 2024-04-04 ENCOUNTER — Other Ambulatory Visit: Payer: Self-pay | Admitting: Adult Health

## 2024-04-04 MED ORDER — ESTRADIOL 0.5 MG PO TABS: 0.5000 mg | ORAL_TABLET | Freq: Every day | ORAL | 3 refills | Status: AC

## 2024-04-04 NOTE — Progress Notes (Signed)
 Patch not available, will rx estrace  0.5 mg 1 daily

## 2024-04-07 ENCOUNTER — Encounter: Payer: Self-pay | Admitting: Family Medicine

## 2024-04-16 ENCOUNTER — Ambulatory Visit (INDEPENDENT_AMBULATORY_CARE_PROVIDER_SITE_OTHER): Admitting: Family Medicine

## 2024-05-13 ENCOUNTER — Encounter

## 2024-06-24 ENCOUNTER — Ambulatory Visit: Admitting: Family Medicine

## 2024-08-13 ENCOUNTER — Ambulatory Visit
